# Patient Record
Sex: Female | Born: 1945
Health system: Southern US, Community
[De-identification: ages and names within clinical notes are randomized; demographics above are authoritative.]

## PROBLEM LIST (undated history)

## (undated) DIAGNOSIS — D473 Essential (hemorrhagic) thrombocythemia: Secondary | ICD-10-CM

## (undated) DIAGNOSIS — I639 Cerebral infarction, unspecified: Secondary | ICD-10-CM

## (undated) DIAGNOSIS — M1990 Unspecified osteoarthritis, unspecified site: Secondary | ICD-10-CM

## (undated) DIAGNOSIS — M810 Age-related osteoporosis without current pathological fracture: Secondary | ICD-10-CM

## (undated) DIAGNOSIS — D75839 Thrombocytosis, unspecified: Secondary | ICD-10-CM

## (undated) DIAGNOSIS — B1920 Unspecified viral hepatitis C without hepatic coma: Secondary | ICD-10-CM

## (undated) DIAGNOSIS — J449 Chronic obstructive pulmonary disease, unspecified: Secondary | ICD-10-CM

## (undated) DIAGNOSIS — I10 Essential (primary) hypertension: Secondary | ICD-10-CM

## (undated) DIAGNOSIS — E785 Hyperlipidemia, unspecified: Secondary | ICD-10-CM

## (undated) HISTORY — DX: Hyperlipidemia, unspecified: E78.5

## (undated) HISTORY — PX: CHOLECYSTECTOMY: SHX55

## (undated) HISTORY — DX: Age-related osteoporosis without current pathological fracture: M81.0

## (undated) HISTORY — DX: Unspecified viral hepatitis C without hepatic coma: B19.20

## (undated) HISTORY — DX: Cerebral infarction, unspecified: I63.9

## (undated) HISTORY — DX: Essential (primary) hypertension: I10

## (undated) HISTORY — PX: TONSILLECTOMY: SUR1361

---
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
## 2000-09-18 ENCOUNTER — Other Ambulatory Visit: Admission: RE | Admit: 2000-09-18 | Discharge: 2000-09-18 | Payer: Self-pay | Admitting: Family Medicine

## 2000-09-24 ENCOUNTER — Encounter: Admission: RE | Admit: 2000-09-24 | Discharge: 2000-09-24 | Payer: Self-pay | Admitting: Family Medicine

## 2000-09-24 ENCOUNTER — Encounter: Payer: Self-pay | Admitting: Family Medicine

## 2000-10-20 ENCOUNTER — Encounter: Admission: RE | Admit: 2000-10-20 | Discharge: 2000-10-20 | Payer: Self-pay | Admitting: Family Medicine

## 2000-10-20 ENCOUNTER — Encounter: Payer: Self-pay | Admitting: Family Medicine

## 2002-05-27 ENCOUNTER — Ambulatory Visit (HOSPITAL_BASED_OUTPATIENT_CLINIC_OR_DEPARTMENT_OTHER): Admission: RE | Admit: 2002-05-27 | Discharge: 2002-05-27 | Payer: Self-pay | Admitting: *Deleted

## 2004-04-16 ENCOUNTER — Encounter: Admission: RE | Admit: 2004-04-16 | Discharge: 2004-04-16 | Payer: Self-pay | Admitting: Family Medicine

## 2005-09-24 ENCOUNTER — Encounter: Admission: RE | Admit: 2005-09-24 | Discharge: 2005-09-24 | Payer: Self-pay | Admitting: Family Medicine

## 2005-10-16 ENCOUNTER — Encounter: Admission: RE | Admit: 2005-10-16 | Discharge: 2005-10-16 | Payer: Self-pay | Admitting: Family Medicine

## 2006-03-03 ENCOUNTER — Other Ambulatory Visit: Admission: RE | Admit: 2006-03-03 | Discharge: 2006-03-03 | Payer: Self-pay | Admitting: Obstetrics and Gynecology

## 2006-07-03 ENCOUNTER — Ambulatory Visit (HOSPITAL_COMMUNITY): Admission: RE | Admit: 2006-07-03 | Discharge: 2006-07-03 | Payer: Self-pay | Admitting: Obstetrics and Gynecology

## 2007-06-02 ENCOUNTER — Encounter: Admission: RE | Admit: 2007-06-02 | Discharge: 2007-06-02 | Payer: Self-pay | Admitting: Obstetrics and Gynecology

## 2007-09-23 ENCOUNTER — Encounter: Admission: RE | Admit: 2007-09-23 | Discharge: 2007-09-23 | Payer: Self-pay | Admitting: Family Medicine

## 2008-10-18 ENCOUNTER — Encounter: Admission: RE | Admit: 2008-10-18 | Discharge: 2008-10-18 | Payer: Self-pay | Admitting: Family Medicine

## 2008-10-20 ENCOUNTER — Encounter: Admission: RE | Admit: 2008-10-20 | Discharge: 2008-10-20 | Payer: Self-pay | Admitting: Family Medicine

## 2010-12-02 ENCOUNTER — Encounter: Payer: Self-pay | Admitting: Family Medicine

## 2011-02-25 ENCOUNTER — Other Ambulatory Visit: Payer: Self-pay | Admitting: Chiropractor

## 2011-02-25 DIAGNOSIS — M545 Low back pain, unspecified: Secondary | ICD-10-CM

## 2011-02-27 ENCOUNTER — Ambulatory Visit
Admission: RE | Admit: 2011-02-27 | Discharge: 2011-02-27 | Disposition: A | Discharge status: Home / Self Care | Payer: BC Managed Care – PPO | Source: Ambulatory Visit | Attending: Chiropractor | Admitting: Chiropractor

## 2011-02-27 DIAGNOSIS — M545 Low back pain, unspecified: Secondary | ICD-10-CM

## 2011-03-13 ENCOUNTER — Other Ambulatory Visit: Payer: Self-pay | Admitting: Family Medicine

## 2011-03-13 DIAGNOSIS — IMO0002 Reserved for concepts with insufficient information to code with codable children: Secondary | ICD-10-CM

## 2011-03-15 ENCOUNTER — Other Ambulatory Visit: Payer: Self-pay | Admitting: Chiropractor

## 2011-03-15 DIAGNOSIS — IMO0002 Reserved for concepts with insufficient information to code with codable children: Secondary | ICD-10-CM

## 2011-03-18 ENCOUNTER — Ambulatory Visit
Admission: RE | Admit: 2011-03-18 | Discharge: 2011-03-18 | Disposition: A | Discharge status: Home / Self Care | Payer: BC Managed Care – PPO | Source: Ambulatory Visit | Attending: Family Medicine | Admitting: Family Medicine

## 2011-03-18 DIAGNOSIS — IMO0002 Reserved for concepts with insufficient information to code with codable children: Secondary | ICD-10-CM

## 2011-03-18 MED ORDER — GADOBENATE DIMEGLUMINE 529 MG/ML IV SOLN
15.0000 mL | Freq: Once | INTRAVENOUS | Status: AC | PRN
  Administered 2011-03-18: 15 mL via INTRAVENOUS

## 2011-03-29 NOTE — H&P (Signed)
NAMEPRAGYA, [name]              ACCOUNT NO.:  [id number]   MEDICAL RECORD NO.:  [id number]          PATIENT TYPE:  AMB   LOCATION:  SDC                           FACILITY:  [facility]   PHYSICIAN:  Juluis Mire, M.D.   DATE OF BIRTH:  Jun 01, 1946   DATE OF ADMISSION:  07/03/2006  DATE OF DISCHARGE:                                HISTORY & PHYSICAL   HISTORY OF PRESENT ILLNESS:  The patient is a 65 year old postmenopausal  patient.  She is troubled with stress urinary incontinence and presents with  a mid-urethral sling.   In relation to the present admission, the patient has had problems with  worsening incontinence.  She leaks urine when she coughs, sneezes or  strains.  She underwent urodynamic testing in the office.  She had a minimal  postvoid residual.  Cystometric studies were normal.  She did leak urine  with coughing and Valsalva.  She had normal leak point pressures.  She also  had a normal urethral pressure profile.  Her voiding pattern does not reveal  an obstructive voiding issue.  In view of this, we discussed a mid-urethral  sling.  Success rates of 80% to 85% were quoted.  The patient is in favor of  that and is admitted at the present time.   ALLERGIES:  Allergic to ERYTHROMYCIN.   MEDICATIONS:  1. Levoxyl 150 mg a day.  2. Lovastatin 20 mg a day.  3. Allegra as needed.   PAST MEDICAL HISTORY:  She has had the usual childhood diseases without  significant sequelae.  She is hypothyroid and on medications as noted.   PAST SURGICAL HISTORY:  She had her tonsils removed in 1964, her gallbladder  removed in 1998 and a previous bilateral tubal ligation in 1983.   OBSTETRICAL HISTORY:  Two vaginal deliveries.   FAMILY HISTORY:  Mother with a history of diabetes, strong family history of  hypertension and heart disease.   SOCIAL HISTORY:  There is no tobacco and only occasional alcohol use.   REVIEW OF SYSTEMS:  Noncontributory.   PHYSICAL EXAMINATION:  GENERAL:   The patient is afebrile with stable vital  signs.  HEENT:  The patient is normocephalic.  Pupils are equal, round and reactive  to light and accommodation.  Extraocular movements were intact.  Sclerae and  conjunctivae are clear.  Oropharynx clear.  NECK:  Without thyromegaly.  BREASTS:  No discrete masses.  LUNGS:  Clear.  CARDIOVASCULAR:  Regular rate and rhythm without murmurs or gallops.  ABDOMEN:  Exam is benign, no mass, organomegaly or tenderness.  PELVIC:  Normal external genitalia.  Vaginal mucosa is clear.  She does have  a mild cystourethrocele, some atrophic vaginal changes.  Cervix is  unremarkable.  Uterus is normal size, shape and contour.  Adnexa free of  masses or tenderness.  EXTREMITIES:  Trace edema.  NEUROLOGIC:  Exam is grossly within normal limits.   IMPRESSION:  Stress urinary incontinence secondary to urethral  hypermobility.   PLAN:  The patient will undergo mid-urethral sling.  Risks have been  discussed including the risks of anesthesia, the  risk of infection, the risk  of vascular injury that could lead to hemorrhage requiring transfusion with  the risks of AIDS or hepatitis, the risk of injury to adjacent organs that  could require exploratory surgery, the risks of deep venous thrombosis and  pulmonary embolus.  Potential risk of mesh erosion has been discussed; this  could be vagina, urethra or bladder, requiring removal.  We discussed the  risk of obturator nerve injury that could lead to chronic leg pain, weakness  and numbness; this may be a long-term issue.  We have discussed the risk of  overcorrection resulting in inability to void; this could require removing  the sling with return of incontinence.  We have also discussed the potential  risk of having the development of an overactive bladder; this can be treated  with medication.  The patient expressed understanding of indications and  risks.      Juluis Mire, M.D.  Electronically  Signed     JSM/MEDQ  D:  07/03/2006  T:  07/03/2006  Job:  [id number]

## 2011-03-29 NOTE — Op Note (Signed)
[name], [name]              ACCOUNT NO.:  [id number]   MEDICAL RECORD NO.:  [id number]          PATIENT TYPE:  AMB   LOCATION:  SDC                           FACILITY:  [facility]   PHYSICIAN:  Juluis Mire, M.D.   DATE OF BIRTH:  [birth year]   DATE OF PROCEDURE:  07/03/2006  DATE OF DISCHARGE:                                 OPERATIVE REPORT   PREOPERATIVE DIAGNOSIS:  Genuine stress incontinence due to urethral  hypermobility.   POSTOPERATIVE DIAGNOSIS:  Genuine stress incontinence due to urethral  hypermobility.   PROCEDURE:  Mid-urethral sling using obturator approach, cystoscopy.   SURGEON:  Juluis Mire, M.D.   ANESTHESIA:  General.   ESTIMATED BLOOD LOSS:  100 cc.   PACKS AND DRAINS:  None.   INTRAOPERATIVE BLOOD REPLACED:  None.   COMPLICATIONS:  None.   INDICATIONS FOR PROCEDURE:  As dictated in the history and physical.   DESCRIPTION OF PROCEDURE:  The patient was taken to the OR and placed in the  supine position.  After a satisfactory level of general anesthesia was  obtained, the patient was placed in the dorsal lithotomy position using the  Allen stirrups.  The perineum and vagina were prepped out with Betadine and  draped as a sterile field.  A Foley was placed to straight drain.   The mid-urethral area was identified.  The vaginal mucosa was instilled with  a solution of 1% Xylocaine with epinephrine.  Using the knife, an incision  was made in the vaginal mucosa underlying the urethra.  We then sharply  dissected outerly towards the obturator foramen.  We developed a nice tunnel  out to the obturator foramen on each side.  We then identified a place  beside the vagina at the level of the clitoris below the abductus muscle and  on the medial edge of the obturator foramen.  These areas were marked and  instilled with 1% Xylocaine with epinephrine.  An incision was made.  Using  the helical [employer] transobturator needle, this was passed on  each  side into the vaginal opening.  The Foley was then removed.  Cystoscopy was  performed.  There was no evidence of any injury to the bladder or urethra.  Both ureteral orifices were noted and did puff urine.  At this point in  time, the polypropylene sling was hooked to each needle and brought out  through the vagina, out through the openings of the skin.  We then adjusted  the sling appropriately and removed the plastic sleeves.  The Foley was  placed back to straight drain.  The vaginal mucosa was then closed with a  running suture of 2-0 Vicryl.  We had no active bleeding or signs of  complication.  Urine output was clear and adequate.  Sponge, needle, and  instrument counts were reported as correct by the circulating nurse x2.   The patient was taken out of the dorsal lithotomy position and once alert  and extubated was transferred to the recovery room in good condition.      Juluis Mire, M.D.  Electronically Signed  JSM/MEDQ  D:  07/03/2006  T:  07/03/2006  Job:  [id number]

## 2011-03-29 NOTE — Op Note (Signed)
[place]  Patient:    [name], [name] Visit Number: [id number] MRN: [id number]          Service Type: DSU Location: [facility] Attending Physician:  Kendell Bane Dictated by:   Lowell Bouton, M.D. Proc. Date: 05/27/02 Admit Date:  05/27/2002   CC:         Carolyne Fiscal, M.D.   Operative Report  PREOPERATIVE DIAGNOSIS:  Septic arthritis, distal interphalangeal joint, left middle finger.  POSTOPERATIVE DIAGNOSIS:  Septic arthritis, distal interphalangeal joint, left middle finger.  OPERATION PERFORMED:  Incision and drainage of distal interphalangeal joint, left middle finger.  SURGEON:  Lowell Bouton, M.D.  ANESTHESIA:  General.  OPERATIVE FINDINGS:  The patient had gross purulent material in the DIP joint and along the extensor tendon sheath.  The flexor sheath did not appear to be involved.  DESCRIPTION OF PROCEDURE:  Under general anesthesia with the tourniquet on the left arm, the left hand was prepped and draped in the usual sterile fashion and after exsanguinating the limb, the tourniquet was inflated to 250 mHg.  A 0.5% Marcaine digital block was inserted for postoperative pain control.  A longitudinal incision was made over the dorsum of the DIP joint down to the extensor tendon.  Blunt dissection was carried down to the tendon and the tendon was longitudinally incised.  Gross purulent material was obtained from the joint and cultures were sent to the lab for aerobic and anaerobic bacteria.  The extensor tendon was undermined and there was a fair amount of fluid that was debrided.  The joint was irrigated copiously with antibiotic solution.  A V-shaped incision was then made across the DIP crease volarly and blunt dissection carried down to the flexor sheath.  The tendon appeared normal at the A-5 pulley and there was no gross purulence.  This wound was irrigated with antibiotic solution.   Both wounds were packed with iodoform packing and the edges were closed with 4-0 nylon sutures.  Sterile dressings were applied.  The tourniquet was released with good circulation to the digits.  The patient went to the recovery room awake and stable in good condition. Dictated by:   Lowell Bouton, M.D. Attending Physician:  Kendell Bane DD:  05/27/02 TD:  05/28/02 Job: [id number]

## 2013-02-18 ENCOUNTER — Other Ambulatory Visit: Payer: Self-pay | Admitting: Gastroenterology

## 2014-05-04 ENCOUNTER — Encounter: Payer: Self-pay | Admitting: *Deleted

## 2014-05-04 DIAGNOSIS — E785 Hyperlipidemia, unspecified: Secondary | ICD-10-CM | POA: Insufficient documentation

## 2014-05-04 DIAGNOSIS — I10 Essential (primary) hypertension: Secondary | ICD-10-CM

## 2014-08-15 ENCOUNTER — Emergency Department (HOSPITAL_COMMUNITY): Payer: Medicare Other

## 2014-08-15 ENCOUNTER — Inpatient Hospital Stay (HOSPITAL_COMMUNITY)
Admission: EM | Admit: 2014-08-15 | Discharge: 2014-08-17 | Disposition: A | Discharge status: Home / Self Care | Payer: Medicare Other | Attending: Internal Medicine | Admitting: Internal Medicine

## 2014-08-15 ENCOUNTER — Encounter (HOSPITAL_COMMUNITY): Payer: Self-pay | Admitting: Emergency Medicine

## 2014-08-15 DIAGNOSIS — I160 Hypertensive urgency: Secondary | ICD-10-CM

## 2014-08-15 DIAGNOSIS — J323 Chronic sphenoidal sinusitis: Secondary | ICD-10-CM | POA: Insufficient documentation

## 2014-08-15 DIAGNOSIS — B1920 Unspecified viral hepatitis C without hepatic coma: Secondary | ICD-10-CM | POA: Insufficient documentation

## 2014-08-15 DIAGNOSIS — Z7983 Long term (current) use of bisphosphonates: Secondary | ICD-10-CM | POA: Insufficient documentation

## 2014-08-15 DIAGNOSIS — Z87891 Personal history of nicotine dependence: Secondary | ICD-10-CM | POA: Insufficient documentation

## 2014-08-15 DIAGNOSIS — E785 Hyperlipidemia, unspecified: Secondary | ICD-10-CM | POA: Diagnosis not present

## 2014-08-15 DIAGNOSIS — E039 Hypothyroidism, unspecified: Secondary | ICD-10-CM | POA: Diagnosis not present

## 2014-08-15 DIAGNOSIS — G453 Amaurosis fugax: Secondary | ICD-10-CM

## 2014-08-15 DIAGNOSIS — G44209 Tension-type headache, unspecified, not intractable: Secondary | ICD-10-CM

## 2014-08-15 DIAGNOSIS — R51 Headache: Principal | ICD-10-CM | POA: Insufficient documentation

## 2014-08-15 DIAGNOSIS — E038 Other specified hypothyroidism: Secondary | ICD-10-CM

## 2014-08-15 DIAGNOSIS — I10 Essential (primary) hypertension: Secondary | ICD-10-CM | POA: Diagnosis present

## 2014-08-15 DIAGNOSIS — R519 Headache, unspecified: Secondary | ICD-10-CM

## 2014-08-15 DIAGNOSIS — Z79899 Other long term (current) drug therapy: Secondary | ICD-10-CM | POA: Insufficient documentation

## 2014-08-15 DIAGNOSIS — B182 Chronic viral hepatitis C: Secondary | ICD-10-CM

## 2014-08-15 DIAGNOSIS — G459 Transient cerebral ischemic attack, unspecified: Secondary | ICD-10-CM

## 2014-08-15 DIAGNOSIS — J019 Acute sinusitis, unspecified: Secondary | ICD-10-CM | POA: Diagnosis present

## 2014-08-15 DIAGNOSIS — G458 Other transient cerebral ischemic attacks and related syndromes: Secondary | ICD-10-CM

## 2014-08-15 DIAGNOSIS — J0100 Acute maxillary sinusitis, unspecified: Secondary | ICD-10-CM | POA: Insufficient documentation

## 2014-08-15 LAB — URINALYSIS, ROUTINE W REFLEX MICROSCOPIC
Bilirubin Urine: NEGATIVE
Glucose, UA: NEGATIVE mg/dL
HGB URINE DIPSTICK: NEGATIVE
KETONES UR: 15 mg/dL — AB
Leukocytes, UA: NEGATIVE
NITRITE: NEGATIVE
PH: 6 (ref 5.0–8.0)
Protein, ur: NEGATIVE mg/dL
Specific Gravity, Urine: 1.013 (ref 1.005–1.030)
Urobilinogen, UA: 0.2 mg/dL (ref 0.0–1.0)

## 2014-08-15 LAB — COMPREHENSIVE METABOLIC PANEL
ALBUMIN: 4.1 g/dL (ref 3.5–5.2)
ALT: 21 U/L (ref 0–35)
AST: 18 U/L (ref 0–37)
Alkaline Phosphatase: 70 U/L (ref 39–117)
Anion gap: 11 (ref 5–15)
BUN: 13 mg/dL (ref 6–23)
CO2: 26 mEq/L (ref 19–32)
CREATININE: 0.95 mg/dL (ref 0.50–1.10)
Calcium: 9.5 mg/dL (ref 8.4–10.5)
Chloride: 102 mEq/L (ref 96–112)
GFR calc Af Amer: 70 mL/min — ABNORMAL LOW (ref >90)
GFR, EST NON AFRICAN AMERICAN: 61 mL/min — AB (ref >90)
Glucose, Bld: 93 mg/dL (ref 70–99)
Potassium: 3.7 mEq/L (ref 3.7–5.3)
SODIUM: 139 meq/L (ref 137–147)
TOTAL PROTEIN: 6.9 g/dL (ref 6.0–8.3)
Total Bilirubin: 0.9 mg/dL (ref 0.3–1.2)

## 2014-08-15 LAB — CBC WITH DIFFERENTIAL/PLATELET
BASOS ABS: 0.1 10*3/uL (ref 0.0–0.1)
Basophils Relative: 1 % (ref 0–1)
EOS PCT: 3 % (ref 0–5)
Eosinophils Absolute: 0.3 10*3/uL (ref 0.0–0.7)
HEMATOCRIT: 43.1 % (ref 36.0–46.0)
Hemoglobin: 14.5 g/dL (ref 12.0–15.0)
Lymphocytes Relative: 22 % (ref 12–46)
Lymphs Abs: 2.1 10*3/uL (ref 0.7–4.0)
MCH: 30.1 pg (ref 26.0–34.0)
MCHC: 33.6 g/dL (ref 30.0–36.0)
MCV: 89.4 fL (ref 78.0–100.0)
MONOS PCT: 8 % (ref 3–12)
Monocytes Absolute: 0.8 10*3/uL (ref 0.1–1.0)
NEUTROS ABS: 6.3 10*3/uL (ref 1.7–7.7)
NEUTROS PCT: 66 % (ref 43–77)
PLATELETS: 338 10*3/uL (ref 150–400)
RBC: 4.82 MIL/uL (ref 3.87–5.11)
RDW: 13.3 % (ref 11.5–15.5)
WBC: 9.5 10*3/uL (ref 4.0–10.5)

## 2014-08-15 LAB — I-STAT TROPONIN, ED: TROPONIN I, POC: 0.01 ng/mL (ref 0.00–0.08)

## 2014-08-15 MED ORDER — HYDROCORTISONE 0.5 % EX CREA
TOPICAL_CREAM | Freq: Every day | CUTANEOUS | Status: DC | PRN
  Filled 2014-08-15: qty 28.35

## 2014-08-15 MED ORDER — RISAQUAD PO CAPS
1.0000 | ORAL_CAPSULE | Freq: Every day | ORAL | Status: DC
  Administered 2014-08-16 – 2014-08-17 (×2): 1 via ORAL
  Filled 2014-08-15 (×2): qty 1

## 2014-08-15 MED ORDER — ALIGN PO CAPS: 1.0000 | ORAL_CAPSULE | Freq: Every day | ORAL | Status: DC

## 2014-08-15 MED ORDER — VITAMIN D3 25 MCG (1000 UNIT) PO TABS
2000.0000 [IU] | ORAL_TABLET | Freq: Every evening | ORAL | Status: DC
  Administered 2014-08-16: 2000 [IU] via ORAL
  Filled 2014-08-15 (×5): qty 2

## 2014-08-15 MED ORDER — HYDROCODONE-ACETAMINOPHEN 5-325 MG PO TABS
1.0000 | ORAL_TABLET | Freq: Once | ORAL | Status: AC
  Administered 2014-08-15: 1 via ORAL
  Filled 2014-08-15: qty 1

## 2014-08-15 MED ORDER — LEVOTHYROXINE SODIUM 100 MCG PO TABS
125.0000 ug | ORAL_TABLET | Freq: Every day | ORAL | Status: DC
  Administered 2014-08-16 – 2014-08-17 (×2): 125 ug via ORAL
  Filled 2014-08-15 (×4): qty 1

## 2014-08-15 MED ORDER — ASPIRIN 325 MG PO TABS
325.0000 mg | ORAL_TABLET | Freq: Every day | ORAL | Status: DC
  Administered 2014-08-16: 325 mg via ORAL
  Filled 2014-08-15: qty 1

## 2014-08-15 MED ORDER — DARIFENACIN HYDROBROMIDE ER 7.5 MG PO TB24
7.5000 mg | ORAL_TABLET | Freq: Every day | ORAL | Status: DC
  Administered 2014-08-16 – 2014-08-17 (×2): 7.5 mg via ORAL
  Filled 2014-08-15 (×2): qty 1

## 2014-08-15 MED ORDER — SENNOSIDES-DOCUSATE SODIUM 8.6-50 MG PO TABS: 1.0000 | ORAL_TABLET | Freq: Every evening | ORAL | Status: DC | PRN

## 2014-08-15 MED ORDER — ONDANSETRON HCL 4 MG/2ML IJ SOLN
4.0000 mg | Freq: Once | INTRAMUSCULAR | Status: AC
  Administered 2014-08-15: 4 mg via INTRAVENOUS
  Filled 2014-08-15: qty 2

## 2014-08-15 MED ORDER — CLOBETASOL PROPIONATE 0.05 % EX CREA
1.0000 "application " | TOPICAL_CREAM | Freq: Every day | CUTANEOUS | Status: DC | PRN
  Filled 2014-08-15: qty 15

## 2014-08-15 MED ORDER — HEPARIN SODIUM (PORCINE) 5000 UNIT/ML IJ SOLN
5000.0000 [IU] | Freq: Three times a day (TID) | INTRAMUSCULAR | Status: DC
  Administered 2014-08-15 – 2014-08-17 (×4): 5000 [IU] via SUBCUTANEOUS
  Filled 2014-08-15 (×4): qty 1

## 2014-08-15 MED ORDER — ATORVASTATIN CALCIUM 10 MG PO TABS
10.0000 mg | ORAL_TABLET | Freq: Every evening | ORAL | Status: DC
  Administered 2014-08-16: 10 mg via ORAL
  Filled 2014-08-15 (×2): qty 1

## 2014-08-15 MED ORDER — MORPHINE SULFATE 4 MG/ML IJ SOLN: 4.0000 mg | Freq: Once | INTRAMUSCULAR | Status: DC

## 2014-08-15 MED ORDER — STROKE: EARLY STAGES OF RECOVERY BOOK
Freq: Once | Status: AC
Start: 2014-08-15 — End: 2014-08-15
  Administered 2014-08-15: 1
  Filled 2014-08-15: qty 1

## 2014-08-15 MED ORDER — ALPRAZOLAM 0.25 MG PO TABS: 0.2500 mg | ORAL_TABLET | Freq: Every day | ORAL | Status: DC | PRN

## 2014-08-15 NOTE — H&P (Signed)
[facility] History and Physical  TALYA QUAIN OJJ:[id number] DOB: 1946/02/11 DOA: 08/15/2014  Referring physician: ED physician PCP: Cari Caraway, MD  Specialists:   Chief Complaint: Headache  HPI: [name] is a 68 y.o. female with PMH of hypertension, hyperlipidemia and who presents complaining of headache.  The patient reports that she started having headache after exercise in Gym at noon. Her headache is located in the left orbital area. It is constant, sharp and throbbing, nonradiating. She took Advil without help. Patient also reports that she seemed to be confused when she received text message from others. She could not understand clearly about the meaning of text message and failed to response to the message transiently. The episode lasted for about 20 minutes.   She did not have any hearing loss, weakness, numbness or tingling sensations over her extremities. She denies slurring of speech, but I found that patient has difficulty in finding words to respond to my questions. Her husband agreed with me that patient has difficulty in speech. She does not have fever, chills, cough, chest pain, shortness of breath. No history of migraine headache. Of note, a month ago patient had the blood pressure medication change. She was initially taking hydrochlorothiazide and losartan. Her doctor discontinue hydrochlorothiazide and double her losartan dose. Currently on losartan 100 mg daily. Her blood pressure was elevated at 190/88 in ED.   She had CT-head which showed R sinusitis without other acute intracranial abnormalities. She is admitted to the inpatient for further evaluation and treatment.  Review of Systems: As presented in the history of presenting illness, rest negative.  Where does patient live?  Lives with her husband at home Can patient participate in ADLs? Yes  Allergy:  Allergies  Allergen Reactions  . Erythromycin Itching and Other (See Comments)    Severe  stomach pains, diarrhea    Past Medical History  Diagnosis Date  . Hypertension   . Hyperlipidemia   . Osteoporosis   . Hepatitis C     History reviewed. No pertinent past surgical history.  Social History:  reports that she has quit smoking. She does not have any smokeless tobacco history on file. She reports that she does not drink alcohol or use illicit drugs.  Family History: History reviewed. No pertinent family history.   Prior to Admission medications   Medication Sig Start Date End Date Taking? Authorizing Provider  ALPRAZolam Duanne Moron) 0.5 MG tablet Take 0.25 mg by mouth daily as needed for anxiety.    Yes Historical Provider, MD  atorvastatin (LIPITOR) 10 MG tablet Take 10 mg by mouth every evening.    Yes Historical Provider, MD  Cholecalciferol (VITAMIN D3) 2000 UNITS TABS Take 2,000 Units by mouth every evening.   Yes Historical Provider, MD  clobetasol cream (TEMOVATE) 9.37 % Apply 1 application topically daily as needed (itching).  08/11/14  Yes Historical Provider, MD  HYDROCORTISONE EX Apply 1 application topically 3 (three) times daily as needed (itching).   Yes Historical Provider, MD  ibandronate (BONIVA) 150 MG tablet Take 150 mg by mouth every 30 (thirty) days. Take in the morning with a full glass of water, on an empty stomach, and do not take anything else by mouth or lie down for the next 30 min.   Yes Historical Provider, MD  ibuprofen (ADVIL,MOTRIN) 200 MG tablet Take 600 mg by mouth 2 (two) times daily as needed for headache (pain).   Yes Historical Provider, MD  levothyroxine (SYNTHROID, LEVOTHROID) 125 MCG tablet Take 125  mcg by mouth daily before breakfast.   Yes Historical Provider, MD  losartan (COZAAR) 100 MG tablet Take 100 mg by mouth every evening.   Yes Historical Provider, MD  Probiotic Product (ALIGN PO) Take 1 capsule by mouth daily.   Yes Historical Provider, MD  solifenacin (VESICARE) 10 MG tablet Take 10 mg by mouth every evening.    Yes  Historical Provider, MD    Physical Exam: Filed Vitals:   08/15/14 2248 08/16/14 0040 08/16/14 0237 08/16/14 0434  BP: 144/77 95/77 104/60 112/60  Pulse: 70 74 67 64  Temp: 98.2 F (36.8 C) 97.9 F (36.6 C) 98.1 F (36.7 C) 98.2 F (36.8 C)  TempSrc: Oral Oral Oral Oral  Resp: 18 18 16 16   Height: 5\' 2"  (1.575 m)     Weight: 67.6 kg (149 lb 0.5 oz)     SpO2: 99% 99% 95% 96%   General: Not in acute distress HEENT: there is no tenderness over her frontal and the maxillary sinuses.        Eyes: PERRL, EOMI, no scleral icterus       ENT: No discharge from the ears and nose, no pharynx injection, no tonsillar enlargement.        Neck: No JVD, no bruit, no mass felt. Cardiac: S1/S2, RRR, No murmurs, gallops or rubs Pulm: Good air movement bilaterally. Clear to auscultation bilaterally. No rales, wheezing, rhonchi or rubs. Abd: Soft, nondistended, nontender, no rebound pain, no organomegaly, BS present Ext: No edema. 2+DP/PT pulse bilaterally Musculoskeletal: No joint deformities, erythema, or stiffness, ROM full Skin: No rashes.  Neuro: Alert and oriented X3, cranial nerves II-XII grossly intact, muscle strength 5/5 in all extremeties, sensation to light touch intact. Brachial reflex 2+ bilaterally. Knee reflex 1+ bilaterally. Negative Babinski's sign. Normal finger to nose test. Psych: Patient is not psychotic, no suicidal or hemocidal ideation.  Labs on Admission:  Basic Metabolic Panel:  Recent Labs Lab 08/15/14 1838  NA 139  K 3.7  CL 102  CO2 26  GLUCOSE 93  BUN 13  CREATININE 0.95  CALCIUM 9.5   Liver Function Tests:  Recent Labs Lab 08/15/14 1838  AST 18  ALT 21  ALKPHOS 70  BILITOT 0.9  PROT 6.9  ALBUMIN 4.1   No results found for this basename: LIPASE, AMYLASE,  in the last 168 hours No results found for this basename: AMMONIA,  in the last 168 hours CBC:  Recent Labs Lab 08/15/14 1838  WBC 9.5  NEUTROABS 6.3  HGB 14.5  HCT 43.1  MCV 89.4   PLT 338   Cardiac Enzymes: No results found for this basename: CKTOTAL, CKMB, CKMBINDEX, TROPONINI,  in the last 168 hours  BNP (last 3 results) No results found for this basename: PROBNP,  in the last 8760 hours CBG: No results found for this basename: GLUCAP,  in the last 168 hours  Radiological Exams on Admission: Dg Chest 2 View  08/15/2014   CLINICAL DATA:  Anterior left headache and altered mental status for 1 day. Ex-smoker.  EXAM: CHEST  2 VIEW  COMPARISON:  None.  FINDINGS: Normal sized heart. Flattening of the hemidiaphragms on the lateral view. Mild diffuse peribronchial thickening and accentuation of the interstitial markings. Diffuse osteopenia. Mild thoracic spine degenerative changes. Cholecystectomy clips.  IMPRESSION: 1. No acute abnormality. 2. Mild changes of COPD and chronic bronchitis.   Electronically Signed   By: Enrique Sack M.D.   On: 08/15/2014 19:40   Ct Head Wo Contrast  08/15/2014   CLINICAL DATA:  Headache that started at 12:30 p.m. today. She also had a brief period of confusion.  EXAM: CT HEAD WITHOUT CONTRAST  TECHNIQUE: Contiguous axial images were obtained from the base of the skull through the vertex without intravenous contrast.  COMPARISON:  Orbits CT dated 10/20/2008.  FINDINGS: Patchy white matter low density in both cerebral hemispheres. Normal size and position of the ventricles. No intracranial hemorrhage, mass lesion or CT evidence of acute infarction. Enlarged central sulcus on the left as well as focal enlargement of a more posteriorly located sulcus on the left. Right maxillary sinus air-fluid level and mild mucosal thickening. Left sphenoid sinus mucosal thickening. Unremarkable bones.  IMPRESSION: 1. No acute abnormality. 2. Marked chronic small vessel white matter ischemic changes in both cerebral hemispheres. 3. Acute right maxillary sinusitis and chronic left sphenoid sinusitis. 4. Focal sulcal enlargement on the left, as described above. This could  be due to focal atrophy or arachnoid cyst formation.   Electronically Signed   By: Enrique Sack M.D.   On: 08/15/2014 19:36    EKG: Independently reviewed.   Assessment/Plan Principal Problem:   Headache Active Problems:   Hyperlipidemia   Hypertension   Hypothyroidism   Acute sinusitis  1. Headache: Etiology is not clear, differential diagnoses include TIA/stroke given her difficulty in speech, and sinusitis as evidenced by CT-head. Patient neuro examination is completely normal. Given her significant risk factors, including lipidemia, hypertension, hypothyroidism and strong family medical history of stroke (both parents had stroke), it is important to rule out TIA or stroke.  - will admit to tele bed - MRI/MRA - Check carotid dopplers - Start ASA 325 mg daily - continue Lipitor - check A1c - will not get fasting lipid panel and TSH since she was tested by her PCP recently with satisfactory results  - 12 lead EKG - 2D transthoracic echocardiography - PT/OT consult  2. HTN:  - hold losartan in the setting of possible TIA or stroke  3. Acute sinusitis: CT head showed acute right maxillary sinusitis and chronic left sphenoid sinusitis, but patient is asymptomatic. It is likely viral, rather than bacterial sinusitis -will observe now.   4. HLD: continue home lipitor  5. hypothyroidism: Continue home Synthroid 125 mcg daily.   DVT ppx: SQ Heparin    Code Status: Full code Family Communication: Yes, patient's  husband at bed side Disposition Plan: Admit to inpatient  Ivor Costa [facility] Pager [phone]  If 7PM-7AM, please contact night-coverage [email or web address] Password [email or web address] 08/16/2014, 5:04 AM

## 2014-08-15 NOTE — ED Notes (Signed)
Pt has h/a over left eye-- does not normally have headaches/

## 2014-08-15 NOTE — ED Notes (Signed)
Admitting Physician at the bedside.  

## 2014-08-15 NOTE — ED Provider Notes (Signed)
CSN: [id number]     Arrival date & time 08/15/14  1647 History   First MD Initiated Contact with Patient 08/15/14 1806     Chief Complaint  Patient presents with  . Headache     (Consider location/radiation/quality/duration/timing/severity/associated sxs/prior Treatment) HPI  68 year old female with history of hypertension, hepatitis C, hyperlipidemia and who presents complaining of headache. A month ago patient had the blood pressure medication change. She was initially taking hydrochlorothiazide and losartan. Her doctor discontinue hydrochlorothiazide and double her losartan dose.  Patient reports since then she has had recurrent headache. She described headaches as a sharp and throbbing sensation behind her left eye usually lasting for minutes and improves with taking Advil. Earlier today she was playing racquetball with her husband. She tripped and fell out but did not strike her head and was able to finish the rest of the game. Later today she developed similar headache behind L eye except more intense. She took the Advil/ibuprofen, a total of 600 mg without any relief. She went to sleep, woke up shortly after to answer a text message.  However she reports having difficulty seeing the text and had a transient sense of confusion. This finding concerns her prompting her to come to the ER for further evaluation. Headache has improved since being in ER.  Currently c/o 6/10 headache.  Otherwise patient did not report any fever, chills, double vision, chest pain, shortness of breath, productive cough, abdominal pain, nausea vomiting diarrhea, dysuria, numbness, weakness, or rash.  Pt did take her BP medication today.     Past Medical History  Diagnosis Date  . Hypertension   . Hyperlipidemia   . Osteoporosis   . Hepatitis C    History reviewed. No pertinent past surgical history. History reviewed. No pertinent family history. History  Substance Use Topics  . Smoking status: Former Research scientist (life sciences)   . Smokeless tobacco: Not on file  . Alcohol Use: No   OB History   Grav Para Term Preterm Abortions TAB SAB Ect Mult Living                 Review of Systems  All other systems reviewed and are negative.     Allergies  Erythromycin  Home Medications   Prior to Admission medications   Medication Sig Start Date End Date Taking? Authorizing Provider  ALPRAZolam Duanne Moron) 0.5 MG tablet Take 0.25 mg by mouth daily as needed for anxiety.    Yes Historical Provider, MD  atorvastatin (LIPITOR) 10 MG tablet Take 10 mg by mouth every evening.    Yes Historical Provider, MD  Cholecalciferol (VITAMIN D3) 2000 UNITS TABS Take 2,000 Units by mouth every evening.   Yes Historical Provider, MD  clobetasol cream (TEMOVATE) 2.35 % Apply 1 application topically daily as needed (itching).  08/11/14  Yes Historical Provider, MD  HYDROCORTISONE EX Apply 1 application topically 3 (three) times daily as needed (itching).   Yes Historical Provider, MD  ibandronate (BONIVA) 150 MG tablet Take 150 mg by mouth every 30 (thirty) days. Take in the morning with a full glass of water, on an empty stomach, and do not take anything else by mouth or lie down for the next 30 min.   Yes Historical Provider, MD  ibuprofen (ADVIL,MOTRIN) 200 MG tablet Take 600 mg by mouth 2 (two) times daily as needed for headache (pain).   Yes Historical Provider, MD  levothyroxine (SYNTHROID, LEVOTHROID) 125 MCG tablet Take 125 mcg by mouth daily before breakfast.   Yes  Historical Provider, MD  losartan (COZAAR) 100 MG tablet Take 100 mg by mouth every evening.   Yes Historical Provider, MD  Probiotic Product (ALIGN PO) Take 1 capsule by mouth daily.   Yes Historical Provider, MD  solifenacin (VESICARE) 10 MG tablet Take 10 mg by mouth every evening.    Yes Historical Provider, MD   BP 190/88  Pulse 77  Temp(Src) 97.3 F (36.3 C)  Resp 16  Wt 150 lb (68.04 kg)  SpO2 100% Physical Exam  Nursing note and vitals  reviewed. Constitutional: She is oriented to person, place, and time. She appears well-developed and well-nourished. No distress.  HENT:  Head: Atraumatic.  Eyes: Conjunctivae and EOM are normal. Pupils are equal, round, and reactive to light.  Neck: Neck supple.  No nuchal rigidity  Cardiovascular: Normal rate and regular rhythm.   Pulmonary/Chest: Effort normal and breath sounds normal.  Abdominal: Soft. There is no tenderness.  Neurological: She is alert and oriented to person, place, and time.  Neurologic exam:  Speech clear, pupils equal round reactive to light, extraocular movements intact  Normal peripheral visual fields Cranial nerves III through XII normal including no facial droop Follows commands, moves all extremities x4, normal strength to bilateral upper and lower extremities at all major muscle groups including grip Sensation normal to light touch and pinprick Coordination intact, no limb ataxia, finger-nose-finger normal Rapid alternating movements normal No pronator drift Gait normal   Skin: No rash noted.  Psychiatric: She has a normal mood and affect.    ED Course  Procedures (including critical care time) Patient with recurrent headache behind her left eye with transient episode of vision changes and confusion which has resolved. Symptom concerning for TIA. Her ABCD2 score is 2, low risk.  Patient with initial blood pressure of 184/88. Concerning for hypertensive urgency versus emergency.  Work up initiated. Care discussed with Dr. Thurnell Garbe.   9:33 PM Head CT demonstrated evidence of acute right maxillary sinusitis and chronic left sphenoid sinusitis. Patient however denies any symptoms to suggest active sinusitis. Also finding of focal sulcal enlargement on the left which may be be due to focal atrophy or arachnoid cyst formation. Unsure if this is related to patient's headache. The remainder of her examination and labs are unremarkable. Given her age,  hypertension, and headache, I have consult at [facility] for admission for further workup for either TIA versus hypertensive urgency. Patient will be admitted to Dr. Blaine Hamper, med surg, under his care.  Pt agrees with plan.     Labs Review Labs Reviewed  COMPREHENSIVE METABOLIC PANEL - Abnormal; Notable for the following:    GFR calc non Af Amer 61 (*)    GFR calc Af Amer 70 (*)    All other components within normal limits  CBC WITH DIFFERENTIAL  URINALYSIS, ROUTINE W REFLEX MICROSCOPIC  I-STAT TROPOININ, ED    Imaging Review Dg Chest 2 View  08/15/2014   CLINICAL DATA:  Anterior left headache and altered mental status for 1 day. Ex-smoker.  EXAM: CHEST  2 VIEW  COMPARISON:  None.  FINDINGS: Normal sized heart. Flattening of the hemidiaphragms on the lateral view. Mild diffuse peribronchial thickening and accentuation of the interstitial markings. Diffuse osteopenia. Mild thoracic spine degenerative changes. Cholecystectomy clips.  IMPRESSION: 1. No acute abnormality. 2. Mild changes of COPD and chronic bronchitis.   Electronically Signed   By: Enrique Sack M.D.   On: 08/15/2014 19:40   Ct Head Wo Contrast  08/15/2014  CLINICAL DATA:  Headache that started at 12:30 p.m. today. She also had a brief period of confusion.  EXAM: CT HEAD WITHOUT CONTRAST  TECHNIQUE: Contiguous axial images were obtained from the base of the skull through the vertex without intravenous contrast.  COMPARISON:  Orbits CT dated 10/20/2008.  FINDINGS: Patchy white matter low density in both cerebral hemispheres. Normal size and position of the ventricles. No intracranial hemorrhage, mass lesion or CT evidence of acute infarction. Enlarged central sulcus on the left as well as focal enlargement of a more posteriorly located sulcus on the left. Right maxillary sinus air-fluid level and mild mucosal thickening. Left sphenoid sinus mucosal thickening. Unremarkable bones.  IMPRESSION: 1. No acute abnormality. 2. Marked chronic  small vessel white matter ischemic changes in both cerebral hemispheres. 3. Acute right maxillary sinusitis and chronic left sphenoid sinusitis. 4. Focal sulcal enlargement on the left, as described above. This could be due to focal atrophy or arachnoid cyst formation.   Electronically Signed   By: Enrique Sack M.D.   On: 08/15/2014 19:36     EKG Interpretation None      Date: 08/15/2014  Rate: 73  Rhythm: normal sinus rhythm  QRS Axis: normal  Intervals: normal  ST/T Wave abnormalities: nonspecific ST/T changes  Conduction Disutrbances:none  Narrative Interpretation:   Old EKG Reviewed: unchanged    MDM   Final diagnoses:  Amaurosis fugax of left eye  Headache around the eyes  Hypertensive urgency  Transient cerebral ischemia, unspecified transient cerebral ischemia type    BP 160/77  Pulse 72  Temp(Src) 98.2 F (36.8 C)  Resp 17  Wt 150 lb (68.04 kg)  SpO2 97%  I have reviewed nursing notes and vital signs. I personally reviewed the imaging tests through PACS system  I reviewed available ER/hospitalization records thought the EMR     Domenic Moras, PA-C 08/15/14 2137  Domenic Moras, PA-C 08/15/14 2138

## 2014-08-15 NOTE — Progress Notes (Signed)
Patient arrived to 4N02 AAOx4. She walked to the bed. Vital signs were taken, tele placed, and questions answered. Denies any headache at this time. Will continue to monitor. [name], [name]

## 2014-08-15 NOTE — ED Notes (Signed)
Per pt sts HA that started about 12:30 this afternoon. sts she also had a brief period of confusion. sts hypertensive. Pt 184/88 in triage. sts took BP meds 45 min ago.

## 2014-08-15 NOTE — ED Notes (Signed)
Pt monitored by pulse ox, bp cuff, and 5-lead. 

## 2014-08-15 NOTE — ED Notes (Signed)
Pt states she fell "on butt" today at the gym while playing raquetball, did not hit head, husband states no LOC, was a witness. Has had BP medicine changed recently.

## 2014-08-16 ENCOUNTER — Inpatient Hospital Stay (HOSPITAL_COMMUNITY): Payer: Medicare Other

## 2014-08-16 DIAGNOSIS — J019 Acute sinusitis, unspecified: Secondary | ICD-10-CM | POA: Diagnosis present

## 2014-08-16 DIAGNOSIS — G453 Amaurosis fugax: Secondary | ICD-10-CM

## 2014-08-16 DIAGNOSIS — G459 Transient cerebral ischemic attack, unspecified: Secondary | ICD-10-CM

## 2014-08-16 LAB — HEMOGLOBIN A1C
HEMOGLOBIN A1C: 5.9 % — AB (ref <5.7)
MEAN PLASMA GLUCOSE: 123 mg/dL — AB (ref <117)

## 2014-08-16 LAB — TROPONIN I: Troponin I: <0.3 ng/mL (ref <0.30)

## 2014-08-16 LAB — TSH: TSH: 3.28 u[IU]/mL (ref 0.350–4.500)

## 2014-08-16 MED ORDER — ASPIRIN 81 MG PO TABS: 81.0000 mg | ORAL_TABLET | Freq: Every day | ORAL | Status: DC

## 2014-08-16 MED ORDER — AMOXICILLIN-POT CLAVULANATE 875-125 MG PO TABS
1.0000 | ORAL_TABLET | Freq: Two times a day (BID) | ORAL | Status: DC
  Administered 2014-08-16 – 2014-08-17 (×3): 1 via ORAL
  Filled 2014-08-16 (×3): qty 1

## 2014-08-16 MED ORDER — AMOXICILLIN-POT CLAVULANATE 875-125 MG PO TABS: 1.0000 | ORAL_TABLET | Freq: Two times a day (BID) | ORAL | Status: DC

## 2014-08-16 NOTE — Progress Notes (Signed)
SLP Cancellation Note  Patient Details Name: [name] MRN: [id number] DOB: 12-04-1945   Cancelled treatment:       Reason Eval/Treat Not Completed: SLP screened, no needs identified, will sign off   Juan Quam Laurice 08/16/2014, 3:50 PM

## 2014-08-16 NOTE — Progress Notes (Signed)
OT Cancellation Note  Patient Details Name: [name] MRN: [id number] DOB: [birth year]   Cancelled Treatment:    Reason Eval/Treat Not Completed: Patient not medically ready (BR)  Peri Maris Pager: [phone]  08/16/2014, 7:45 AM

## 2014-08-16 NOTE — Evaluation (Signed)
Physical Therapy Evaluation Patient Details Name: [name] MRN: [id number] DOB: Nov 01, 1946 Today's Date: 08/16/2014   History of Present Illness  The patient is 68 yo female admitted 08/15/14 who reports that she started having headache after exercise in Gym at noon. Her headache is located in the left orbital area. It is constant, sharp and throbbing, nonradiating. She took Advil without help. Patient also reports that she seemed to be confused when she received text message from others. She could not understand clearly about the meaning of text message and failed to response to the message transiently. The episode lasted for about 20 minutes. admitted for full workup of TIA.  Clinical Impression  Patient presents with no balance/motor deficits at this time. PT to sign off.    Follow Up Recommendations No PT follow up    Equipment Recommendations  None recommended by PT    Recommendations for Other Services       Precautions / Restrictions Precautions Precautions: None      Mobility  Bed Mobility                  Transfers Overall transfer level: Independent Equipment used: None             General transfer comment: noted up ad lib in room., has walked in hall with spouse.  Ambulation/Gait Ambulation/Gait assistance: Independent Ambulation Distance (Feet): 600 Feet Assistive device: None Gait Pattern/deviations: WFL(Within Functional Limits)   Gait velocity interpretation: at or above normal speed for age/gender    Stairs Stairs: Yes Stairs assistance: Independent Stair Management: No rails;Forwards Number of Stairs: 2 General stair comments: reciprocal, no  UE support  Wheelchair Mobility    Modified Rankin (Stroke Patients Only)       Balance Overall balance assessment: Independent         Standing balance support: No upper extremity supported;During functional activity Standing balance-Leahy Scale: Normal                High level balance activites: Direction changes;Turns;Sudden stops High Level Balance Comments: reciprocal  stepping, picks up object from floor with losses             Pertinent Vitals/Pain Pain Assessment: No/denies pain    Home Living Family/patient expects to be discharged to:: Private residence   Available Help at Discharge: Family Type of Home: House Home Access: Level entry     Home Layout: Full bath on main level Home Equipment: None      Prior Function Level of Independence: Independent               Hand Dominance        Extremity/Trunk Assessment               Lower Extremity Assessment: Overall WFL for tasks assessed      Cervical / Trunk Assessment: Normal  Communication   Communication: No difficulties  Cognition Arousal/Alertness: Awake/alert Behavior During Therapy: WFL for tasks assessed/performed Overall Cognitive Status: Within Functional Limits for tasks assessed                      General Comments      Exercises        Assessment/Plan    PT Assessment Patent does not need any further PT services  PT Diagnosis Other (comment);Altered mental status (TIA)   PT Problem List    PT Treatment Interventions     PT Goals (Current goals can be found in  the Care Plan section) Acute Rehab PT Goals PT Goal Formulation: No goals set, d/c therapy    Frequency     Barriers to discharge        Co-evaluation               End of Session   Activity Tolerance: Patient tolerated treatment well Patient left: with family/visitor present           Time: 1218-1226 PT Time Calculation (min): 8 min   Charges:   PT Evaluation $Initial PT Evaluation Tier I: 1 Procedure     PT G CodesClaretha Cooper 08/16/2014, 12:55 PM Tresa Endo PT [phone]

## 2014-08-16 NOTE — Progress Notes (Signed)
VASCULAR LAB PRELIMINARY  PRELIMINARY  PRELIMINARY  PRELIMINARY  Carotid duplex completed.    Preliminary report:  Bilateral:  1-39% ICA stenosis.  Vertebral artery flow is antegrade.      Tanna Loeffler, RVT 08/16/2014, 5:05 PM

## 2014-08-16 NOTE — Progress Notes (Signed)
Echocardiogram 2D Echocardiogram has been performed.  Lashaundra Lehrmann 08/16/2014, 4:52 PM

## 2014-08-16 NOTE — Evaluation (Signed)
SLP Cancellation Note  Patient Details Name: [name] MRN: [id number] DOB: [birth year]   Cancelled treatment:       Reason Eval/Treat Not Completed: Other (comment) (pt leaving for MRI currently, will reattempt at another time)   [name], [facility] SLP [phone]

## 2014-08-16 NOTE — Discharge Summary (Signed)
  Physician Discharge Summary  Sharryn H Slider MRN: [id number] DOB/AGE: 05/04/1946 68 y.o.  PCP: MCNEILL,WENDY, MD   Admit date: 08/15/2014 Discharge date: 08/16/2014  Discharge Diagnoses:  Probable TIA Complicated migraine   Headache Active Problems:   Hyperlipidemia   Hypertension   Hypothyroidism   Acute sinusitis  Followup recommendations Follow up with PCP in 5-7 days    Medication List    STOP taking these medications       ibuprofen 200 MG tablet  Commonly known as:  ADVIL,MOTRIN      TAKE these medications       ALIGN PO  Take 1 capsule by mouth daily.     ALPRAZolam 0.5 MG tablet  Commonly known as:  XANAX  Take 0.25 mg by mouth daily as needed for anxiety.     aspirin 81 MG tablet  Take 1 tablet (81 mg total) by mouth daily.     atorvastatin 10 MG tablet  Commonly known as:  LIPITOR  Take 10 mg by mouth every evening.     clobetasol cream 0.05 %  Commonly known as:  TEMOVATE  Apply 1 application topically daily as needed (itching).     HYDROCORTISONE EX  Apply 1 application topically 3 (three) times daily as needed (itching).     ibandronate 150 MG tablet  Commonly known as:  BONIVA  Take 150 mg by mouth every 30 (thirty) days. Take in the morning with a full glass of water, on an empty stomach, and do not take anything else by mouth or lie down for the next 30 min.     levothyroxine 125 MCG tablet  Commonly known as:  SYNTHROID, LEVOTHROID  Take 125 mcg by mouth daily before breakfast.     losartan 100 MG tablet  Commonly known as:  COZAAR  Take 100 mg by mouth every evening.     solifenacin 10 MG tablet  Commonly known as:  VESICARE  Take 10 mg by mouth every evening.     Vitamin D3 2000 UNITS Tabs  Take 2,000 Units by mouth every evening.        Discharge Condition: Stable  Disposition: Final discharge disposition not confirmed   Consults: None   Significant Diagnostic Studies: Dg Chest 2 View  08/15/2014    CLINICAL DATA:  Anterior left headache and altered mental status for 1 day. Ex-smoker.  EXAM: CHEST  2 VIEW  COMPARISON:  None.  FINDINGS: Normal sized heart. Flattening of the hemidiaphragms on the lateral view. Mild diffuse peribronchial thickening and accentuation of the interstitial markings. Diffuse osteopenia. Mild thoracic spine degenerative changes. Cholecystectomy clips.  IMPRESSION: 1. No acute abnormality. 2. Mild changes of COPD and chronic bronchitis.   Electronically Signed   By: Steve  Reid M.D.   On: 08/15/2014 19:40   Ct Head Wo Contrast  08/15/2014   CLINICAL DATA:  Headache that started at 12:30 p.m. today. She also had a brief period of confusion.  EXAM: CT HEAD WITHOUT CONTRAST  TECHNIQUE: Contiguous axial images were obtained from the base of the skull through the vertex without intravenous contrast.  COMPARISON:  Orbits CT dated 10/20/2008.  FINDINGS: Patchy white matter low density in both cerebral hemispheres. Normal size and position of the ventricles. No intracranial hemorrhage, mass lesion or CT evidence of acute infarction. Enlarged central sulcus on the left as well as focal enlargement of a more posteriorly located sulcus on the left. Right maxillary sinus air-fluid level and mild mucosal thickening. Left sphenoid   sinus mucosal thickening. Unremarkable bones.  IMPRESSION: 1. No acute abnormality. 2. Marked chronic small vessel white matter ischemic changes in both cerebral hemispheres. 3. Acute right maxillary sinusitis and chronic left sphenoid sinusitis. 4. Focal sulcal enlargement on the left, as described above. This could be due to focal atrophy or arachnoid cyst formation.   Electronically Signed   By: Steve  Reid M.D.   On: 08/15/2014 19:36     Microbiology: No results found for this or any previous visit (from the past 240 hour(s)).   Labs: Results for orders placed during the hospital encounter of 08/15/14 (from the past 48 hour(s))  CBC WITH DIFFERENTIAL      Status: None   Collection Time    08/15/14  6:38 PM      Result Value Ref Range   WBC 9.5  4.0 - 10.5 K/uL   RBC 4.82  3.87 - 5.11 MIL/uL   Hemoglobin 14.5  12.0 - 15.0 g/dL   HCT 43.1  36.0 - 46.0 %   MCV 89.4  78.0 - 100.0 fL   MCH 30.1  26.0 - 34.0 pg   MCHC 33.6  30.0 - 36.0 g/dL   RDW 13.3  11.5 - 15.5 %   Platelets 338  150 - 400 K/uL   Neutrophils Relative % 66  43 - 77 %   Neutro Abs 6.3  1.7 - 7.7 K/uL   Lymphocytes Relative 22  12 - 46 %   Lymphs Abs 2.1  0.7 - 4.0 K/uL   Monocytes Relative 8  3 - 12 %   Monocytes Absolute 0.8  0.1 - 1.0 K/uL   Eosinophils Relative 3  0 - 5 %   Eosinophils Absolute 0.3  0.0 - 0.7 K/uL   Basophils Relative 1  0 - 1 %   Basophils Absolute 0.1  0.0 - 0.1 K/uL  COMPREHENSIVE METABOLIC PANEL     Status: Abnormal   Collection Time    08/15/14  6:38 PM      Result Value Ref Range   Sodium 139  137 - 147 mEq/L   Potassium 3.7  3.7 - 5.3 mEq/L   Chloride 102  96 - 112 mEq/L   CO2 26  19 - 32 mEq/L   Glucose, Bld 93  70 - 99 mg/dL   BUN 13  6 - 23 mg/dL   Creatinine, Ser 0.95  0.50 - 1.10 mg/dL   Calcium 9.5  8.4 - 10.5 mg/dL   Total Protein 6.9  6.0 - 8.3 g/dL   Albumin 4.1  3.5 - 5.2 g/dL   AST 18  0 - 37 U/L   ALT 21  0 - 35 U/L   Alkaline Phosphatase 70  39 - 117 U/L   Total Bilirubin 0.9  0.3 - 1.2 mg/dL   GFR calc non Af Amer 61 (*) >90 mL/min   GFR calc Af Amer 70 (*) >90 mL/min   Comment: (NOTE)     The eGFR has been calculated using the CKD EPI equation.     This calculation has not been validated in all clinical situations.     eGFR's persistently <90 mL/min signify possible Chronic Kidney     Disease.   Anion gap 11  5 - 15  I-STAT TROPOININ, ED     Status: None   Collection Time    08/15/14  7:59 PM      Result Value Ref Range   Troponin i, poc 0.01  0.00 -   0.08 ng/mL   Comment 3            Comment: Due to the release kinetics of cTnI,     a negative result within the first hours     of the onset of symptoms does  not rule out     myocardial infarction with certainty.     If myocardial infarction is still suspected,     repeat the test at appropriate intervals.  URINALYSIS, ROUTINE W REFLEX MICROSCOPIC     Status: Abnormal   Collection Time    08/15/14  9:29 PM      Result Value Ref Range   Color, Urine YELLOW  YELLOW   APPearance CLEAR  CLEAR   Specific Gravity, Urine 1.013  1.005 - 1.030   pH 6.0  5.0 - 8.0   Glucose, UA NEGATIVE  NEGATIVE mg/dL   Hgb urine dipstick NEGATIVE  NEGATIVE   Bilirubin Urine NEGATIVE  NEGATIVE   Ketones, ur 15 (*) NEGATIVE mg/dL   Protein, ur NEGATIVE  NEGATIVE mg/dL   Urobilinogen, UA 0.2  0.0 - 1.0 mg/dL   Nitrite NEGATIVE  NEGATIVE   Leukocytes, UA NEGATIVE  NEGATIVE   Comment: MICROSCOPIC NOT DONE ON URINES WITH NEGATIVE PROTEIN, BLOOD, LEUKOCYTES, NITRITE, OR GLUCOSE <1000 mg/dL.     HPI :*[name] is a 67 y.o. female with PMH of hypertension, hyperlipidemia and who presents complaining of headache.  The patient reports that she started having headache after exercise in Gym at noon. Her headache is located in the left orbital area. It is constant, sharp and throbbing, nonradiating. She took Advil without help. Patient also reports that she seemed to be confused when she received text message from others. She could not understand clearly about the meaning of text message and failed to response to the message transiently. The episode lasted for about 20 minutes.  She did not have any hearing loss, weakness, numbness or tingling sensations over her extremities. She denies slurring of speech, but I found that patient has difficulty in finding words to respond to my questions. Her husband agreed with me that patient has difficulty in speech. She does not have fever, chills, cough, chest pain, shortness of breath. No history of migraine headache. Of note, a month ago patient had the blood pressure medication change. She was initially taking hydrochlorothiazide and  losartan. Her doctor discontinue hydrochlorothiazide and double her losartan dose. Currently on losartan 100 mg daily. Her blood pressure was elevated at 190/88 in ED.  She had CT-head which showed R sinusitis without other acute intracranial abnormalities  HOSPITAL COURSE:   1. Headache: Etiology is not clear, differential diagnoses include TIA/stroke given her difficulty in speech, and acute sinusitis as evidenced by CT-head. Patient neuro examination is completely normal. Given her significant risk factors, including lipidemia, hypertension, hypothyroidism and strong family medical history of stroke (both parents had stroke), patient was admitted for a full TIA workup Telemetry was stable - MRI/MRA pending - Check carotid dopplers pending Initiated on ASA 325 mg daily , patient continue with 81 mg a day - continue Lipitor  Hemoglobin A1c pending Recent lipid panel at PCP office - 12 lead EKG  - 2D transthoracic echocardiography  - PT/OT consult    2. HTN:  - hold losartan in the setting of possible TIA or stroke    3. Acute sinusitis: CT head showed acute right maxillary sinusitis and chronic left sphenoid sinusitis, but patient is asymptomatic.  Patient will be treated   with Augmentin for 10 days  4. HLD: continue home lipitor    5. hypothyroidism: Continue home Synthroid 125 mcg daily. , TSH pending    Discharge Exam:  Blood pressure 102/48, pulse 66, temperature 98.4 F (36.9 C), temperature source Oral, resp. rate 18, height 5' 2" (1.575 m), weight 67.6 kg (149 lb 0.5 oz), SpO2 96.00%.  General: Not in acute distress  HEENT: there is no tenderness over her frontal and the maxillary sinuses.  Eyes: PERRL, EOMI, no scleral icterus  ENT: No discharge from the ears and nose, no pharynx injection, no tonsillar enlargement.  Neck: No JVD, no bruit, no mass felt.  Cardiac: S1/S2, RRR, No murmurs, gallops or rubs  Pulm: Good air movement bilaterally. Clear to auscultation  bilaterally. No rales, wheezing, rhonchi or rubs.  Abd: Soft, nondistended, nontender, no rebound pain, no organomegaly, BS present  Ext: No edema. 2+DP/PT pulse bilaterally  Musculoskeletal: No joint deformities, erythema, or stiffness, ROM full  Skin: No rashes.  Neuro: Alert and oriented X3, cranial nerves II-XII grossly intact, muscle strength 5/5 in all extremeties, sensation to light touch intact. Brachial reflex 2+ bilaterally. Knee reflex 1+ bilaterally. Negative Babinski's sign. Normal finger to nose test.  Psych: Patient is not psychotic, no suicidal or hemocidal ideation.        Signed: , 08/16/2014, 9:59 AM          

## 2014-08-17 DIAGNOSIS — I10 Essential (primary) hypertension: Secondary | ICD-10-CM

## 2014-08-17 DIAGNOSIS — G459 Transient cerebral ischemic attack, unspecified: Secondary | ICD-10-CM | POA: Diagnosis present

## 2014-08-17 DIAGNOSIS — G44209 Tension-type headache, unspecified, not intractable: Secondary | ICD-10-CM

## 2014-08-17 DIAGNOSIS — E785 Hyperlipidemia, unspecified: Secondary | ICD-10-CM

## 2014-08-17 MED ORDER — AMOXICILLIN-POT CLAVULANATE 875-125 MG PO TABS: 1.0000 | ORAL_TABLET | Freq: Two times a day (BID) | ORAL | Status: AC

## 2014-08-17 MED ORDER — ASPIRIN 81 MG PO TABS: 81.0000 mg | ORAL_TABLET | Freq: Every day | ORAL | Status: DC

## 2014-08-17 MED ORDER — LOSARTAN POTASSIUM 50 MG PO TABS: 100.0000 mg | ORAL_TABLET | Freq: Every evening | ORAL | Status: DC

## 2014-08-17 MED ORDER — ASPIRIN EC 81 MG PO TBEC
81.0000 mg | DELAYED_RELEASE_TABLET | Freq: Every day | ORAL | Status: DC
  Administered 2014-08-17: 81 mg via ORAL
  Filled 2014-08-17: qty 1

## 2014-08-17 NOTE — Progress Notes (Signed)
OT Discharge Note  Patient Details Name: [name] MRN: [id number] DOB: October 04, 1946   Cancelled Treatment:    Reason Eval/Treat Not Completed: OT screened, no needs identified, will sign off. OT spoke with patient directly and no acute needs identified. Pt standing using cell phone and writing on arrival. Spouse present and reports pt appears to be at baseline  Spouse and Pt have questions regarding MRI impression statement. RN [facility] notified. Pt and spouse advised to direct questions directly to MD.  Peri Maris Pager: [phone]  08/17/2014, 9:15 AM

## 2014-08-17 NOTE — Discharge Summary (Signed)
Physician Discharge Summary  Patient ID: [name] MRN: [id number] DOB/AGE: 17-Aug-1946 68 y.o.  Admit date: 08/15/2014 Discharge date: 08/17/2014  Primary Care Physician:  Cari Caraway, MD  Discharge Diagnoses:    . Headache possibly due to probable TIA versus complicated migraine  . Hyperlipidemia . Hypertension- accelerated  . Hypothyroidism . Acute sinusitis  Consults: None   Recommendations for Outpatient Follow-up:  Please followup HbA1c and lipid panel in 3 months  Allergies:   Allergies  Allergen Reactions  . Erythromycin Itching and Other (See Comments)    Severe stomach pains, diarrhea     Discharge Medications:   Medication List    STOP taking these medications       ibuprofen 200 MG tablet  Commonly known as:  ADVIL,MOTRIN      TAKE these medications       ALIGN PO  Take 1 capsule by mouth daily.     ALPRAZolam 0.5 MG tablet  Commonly known as:  XANAX  Take 0.25 mg by mouth daily as needed for anxiety.     amoxicillin-clavulanate 875-125 MG per tablet  Commonly known as:  AUGMENTIN  Take 1 tablet by mouth 2 (two) times daily. X 10 days     aspirin 81 MG tablet  Take 1 tablet (81 mg total) by mouth daily.     atorvastatin 10 MG tablet  Commonly known as:  LIPITOR  Take 10 mg by mouth every evening.     clobetasol cream 0.05 %  Commonly known as:  TEMOVATE  Apply 1 application topically daily as needed (itching).     HYDROCORTISONE EX  Apply 1 application topically 3 (three) times daily as needed (itching).     ibandronate 150 MG tablet  Commonly known as:  BONIVA  Take 150 mg by mouth every 30 (thirty) days. Take in the morning with a full glass of water, on an empty stomach, and do not take anything else by mouth or lie down for the next 30 min.     levothyroxine 125 MCG tablet  Commonly known as:  SYNTHROID, LEVOTHROID  Take 125 mcg by mouth daily before breakfast.     losartan 100 MG tablet  Commonly known as:  COZAAR   Take 100 mg by mouth every evening.     solifenacin 10 MG tablet  Commonly known as:  VESICARE  Take 10 mg by mouth every evening.     Vitamin D3 2000 UNITS Tabs  Take 2,000 Units by mouth every evening.         Brief H and P: For complete details please refer to admission H and P, but in brief [name] is a 68 y.o. female with PMH of hypertension, hyperlipidemia and who presents complaining of headache.  The patient reported that she started having headache after exercise in Gym at noon. Her headache was located in the left orbital area. It was constant, sharp and throbbing, nonradiating. She took Advil without help. Patient also reported that she seemed to be confused when she received text message from others. She could not understand clearly about the meaning of text message and failed to response to the message transiently. The episode lasted for about 20 minutes.  She did not have any hearing loss, weakness, numbness or tingling sensations over her extremities. She denied slurring of speech but patient was found to have difficulty in finding words by the admitting physician. She did not have fever, chills, cough, chest pain, shortness of breath. No history  of migraine headache. Of note, a month ago patient had the blood pressure medication change. She was initially taking hydrochlorothiazide and losartan. Her doctor discontinue hydrochlorothiazide and double her losartan dose. Currently on losartan 100 mg daily. Her blood pressure was elevated at 190/88 in ED.  She had CT-head which showed R sinusitis without other acute intracranial abnormalities. She is admitted to the inpatient for further evaluation and treatment.   Hospital Course:     Headache: Likely due to sinusitis headache versus probable TIA versus completed migraine. CT head showed acute sinusitis. Patient's neuro examination was completely normal. Given her significant risk factors, including lipidemia,  hypertension, hypothyroidism and strong family medical history of stroke (both parents had stroke), patient was admitted for a full TIA workup. MRI of the brain showed no acute intracranial abnormality, very advanced signal changes in the brain suggesting chronic small vessel disease, negative intracranial MRA.  2-D echo showed EF of 60%, normal wall motion, no cardiac source of embolism. Carotid Dopplers showed 1-39% stenosis involving right and left internal carotid artery. Hemoglobin A1c 5.9, patient was recommended carb controlled diet and exercise Patient had a lipid panel checked by her PCP last month which showed cholesterol 153, triglycerides 80, HDL 49, LDL 88, patient was recommended to continue with statins  Active Problems:   Hyperlipidemia Patient had a lipid panel checked by her PCP last month which showed cholesterol 153, triglycerides 80, HDL 49, LDL 88, patient was recommended to continue with statins    Hypertension - For now continue with losartan, may need to add another agent/antihypertensive by PCP    Hypothyroidism - TSH 3.2, continue Synthroid    Acute sinusitis -Placed on Augmentin for 10 days  Day of Discharge BP 138/79  Pulse 73  Temp(Src) 98.2 F (36.8 C) (Oral)  Resp 18  Ht 5\' 2"  (1.575 m)  Wt 67.6 kg (149 lb 0.5 oz)  BMI 27.25 kg/m2  SpO2 98%  Physical Exam: General: Alert and awake oriented x3 not in any acute distress. HEENT: anicteric sclera, pupils reactive to light and accommodation CVS: S1-S2 clear no murmur rubs or gallops Chest: clear to auscultation bilaterally, no wheezing rales or rhonchi Abdomen: soft nontender, nondistended, normal bowel sounds Extremities: no cyanosis, clubbing or edema noted bilaterally Neuro: Cranial nerves II-XII intact, no focal neurological deficits   The results of significant diagnostics from this hospitalization (including imaging, microbiology, ancillary and laboratory) are listed below for reference.     LAB RESULTS: Basic Metabolic Panel:  Recent Labs Lab 08/15/14 1838  NA 139  K 3.7  CL 102  CO2 26  GLUCOSE 93  BUN 13  CREATININE 0.95  CALCIUM 9.5   Liver Function Tests:  Recent Labs Lab 08/15/14 1838  AST 18  ALT 21  ALKPHOS 70  BILITOT 0.9  PROT 6.9  ALBUMIN 4.1   No results found for this basename: LIPASE, AMYLASE,  in the last 168 hours No results found for this basename: AMMONIA,  in the last 168 hours CBC:  Recent Labs Lab 08/15/14 1838  WBC 9.5  NEUTROABS 6.3  HGB 14.5  HCT 43.1  MCV 89.4  PLT 338   Cardiac Enzymes:  Recent Labs Lab 08/16/14 1130  TROPONINI <0.30   BNP: No components found with this basename: POCBNP,  CBG: No results found for this basename: GLUCAP,  in the last 168 hours  Significant Diagnostic Studies:  Dg Chest 2 View  08/15/2014   CLINICAL DATA:  Anterior left headache and altered mental  status for 1 day. Ex-smoker.  EXAM: CHEST  2 VIEW  COMPARISON:  None.  FINDINGS: Normal sized heart. Flattening of the hemidiaphragms on the lateral view. Mild diffuse peribronchial thickening and accentuation of the interstitial markings. Diffuse osteopenia. Mild thoracic spine degenerative changes. Cholecystectomy clips.  IMPRESSION: 1. No acute abnormality. 2. Mild changes of COPD and chronic bronchitis.   Electronically Signed   By: Enrique Sack M.D.   On: 08/15/2014 19:40   Ct Head Wo Contrast  08/15/2014   CLINICAL DATA:  Headache that started at 12:30 p.m. today. She also had a brief period of confusion.  EXAM: CT HEAD WITHOUT CONTRAST  TECHNIQUE: Contiguous axial images were obtained from the base of the skull through the vertex without intravenous contrast.  COMPARISON:  Orbits CT dated 10/20/2008.  FINDINGS: Patchy white matter low density in both cerebral hemispheres. Normal size and position of the ventricles. No intracranial hemorrhage, mass lesion or CT evidence of acute infarction. Enlarged central sulcus on the left as well as  focal enlargement of a more posteriorly located sulcus on the left. Right maxillary sinus air-fluid level and mild mucosal thickening. Left sphenoid sinus mucosal thickening. Unremarkable bones.  IMPRESSION: 1. No acute abnormality. 2. Marked chronic small vessel white matter ischemic changes in both cerebral hemispheres. 3. Acute right maxillary sinusitis and chronic left sphenoid sinusitis. 4. Focal sulcal enlargement on the left, as described above. This could be due to focal atrophy or arachnoid cyst formation.   Electronically Signed   By: Enrique Sack M.D.   On: 08/15/2014 19:36   Mr Virgel Paling Wo Contrast  08/16/2014   CLINICAL DATA:  68 year old female with acute onset headache located in the left orbit region, constant sharp and non radiating. Subsequent confusion for about 20 min. Changes in speech. Initial encounter.  EXAM: MRI HEAD WITHOUT CONTRAST  MRA HEAD WITHOUT CONTRAST  TECHNIQUE: Multiplanar, multiecho pulse sequences of the brain and surrounding structures were obtained without intravenous contrast. Angiographic images of the head were obtained using MRA technique without contrast.  COMPARISON:  Head CT without contrast 08/15/2014 and earlier.  FINDINGS: MRI HEAD FINDINGS  Cerebral volume is within normal limits for age. No restricted diffusion to suggest acute infarction. No midline shift, mass effect, evidence of mass lesion, ventriculomegaly, extra-axial collection or acute intracranial hemorrhage. Cervicomedullary junction and pituitary are within normal limits. Negative visualized cervical spine. Major intracranial vascular flow voids are preserved.  Widespread and confluent abnormal T2 and FLAIR hyperintensity in the cerebral white matter, an the pons. Susceptibility weighted images positive for or numerous chronic micro hemorrhages scattered throughout the brain (series 9) including in the brainstem and cerebellum. No changes suggesting prior macroscopic hemorrhage. No definite cortical  encephalomalacia.  Visible internal auditory structures appear normal. Mastoids are clear. Visualized orbit soft tissues are within normal limits. There is some paranasal sinus mucosal thickening primarily in the right maxillary sinus and left sphenoid sinus. Possible fluid level in the right maxillary. Visualized scalp soft tissues are within normal limits. Normal bone marrow signal.  MRA HEAD FINDINGS  Antegrade flow in the posterior circulation with fairly codominant distal vertebral arteries. Normal PICA origins. Normal vertebrobasilar junction. No basilar artery stenosis. SCA and PCA origins are normal. Posterior communicating arteries are diminutive or absent. Bilateral PCA branches are within normal limits.  Antegrade flow in both ICA siphons. No siphon stenosis. Ophthalmic artery origins are within normal limits.  The carotid termini are patent. The MCA and ACA origins are within normal limits,  however there is an unusual fenestrated or duplicated appearance of vessels about the bilateral ACA A1 segments. The anterior communicating artery also appears to be fenestrated. This most resembles normal anatomic variation, and in fact a prominent right MCA (sylvian) branch appears to arise from the right A1 segment (see series 504, image 10).  Otherwise negative visualized bilateral ACA branches. Visualized bilateral MCA branches are within normal limits.  IMPRESSION: 1.  No acute intracranial abnormality. 2. Very advanced signal changes in the brain suggesting chronic small vessel disease, including numerous chronic micro hemorrhages. 3. Negative intracranial MRA. Normal anatomic variation of the anterior circulation.   Electronically Signed   By: Lars Pinks M.D.   On: 08/16/2014 11:10   Mr Brain Wo Contrast  08/16/2014   CLINICAL DATA:  68 year old female with acute onset headache located in the left orbit region, constant sharp and non radiating. Subsequent confusion for about 20 min. Changes in speech.  Initial encounter.  EXAM: MRI HEAD WITHOUT CONTRAST  MRA HEAD WITHOUT CONTRAST  TECHNIQUE: Multiplanar, multiecho pulse sequences of the brain and surrounding structures were obtained without intravenous contrast. Angiographic images of the head were obtained using MRA technique without contrast.  COMPARISON:  Head CT without contrast 08/15/2014 and earlier.  FINDINGS: MRI HEAD FINDINGS  Cerebral volume is within normal limits for age. No restricted diffusion to suggest acute infarction. No midline shift, mass effect, evidence of mass lesion, ventriculomegaly, extra-axial collection or acute intracranial hemorrhage. Cervicomedullary junction and pituitary are within normal limits. Negative visualized cervical spine. Major intracranial vascular flow voids are preserved.  Widespread and confluent abnormal T2 and FLAIR hyperintensity in the cerebral white matter, an the pons. Susceptibility weighted images positive for or numerous chronic micro hemorrhages scattered throughout the brain (series 9) including in the brainstem and cerebellum. No changes suggesting prior macroscopic hemorrhage. No definite cortical encephalomalacia.  Visible internal auditory structures appear normal. Mastoids are clear. Visualized orbit soft tissues are within normal limits. There is some paranasal sinus mucosal thickening primarily in the right maxillary sinus and left sphenoid sinus. Possible fluid level in the right maxillary. Visualized scalp soft tissues are within normal limits. Normal bone marrow signal.  MRA HEAD FINDINGS  Antegrade flow in the posterior circulation with fairly codominant distal vertebral arteries. Normal PICA origins. Normal vertebrobasilar junction. No basilar artery stenosis. SCA and PCA origins are normal. Posterior communicating arteries are diminutive or absent. Bilateral PCA branches are within normal limits.  Antegrade flow in both ICA siphons. No siphon stenosis. Ophthalmic artery origins are within  normal limits.  The carotid termini are patent. The MCA and ACA origins are within normal limits, however there is an unusual fenestrated or duplicated appearance of vessels about the bilateral ACA A1 segments. The anterior communicating artery also appears to be fenestrated. This most resembles normal anatomic variation, and in fact a prominent right MCA (sylvian) branch appears to arise from the right A1 segment (see series 504, image 10).  Otherwise negative visualized bilateral ACA branches. Visualized bilateral MCA branches are within normal limits.  IMPRESSION: 1.  No acute intracranial abnormality. 2. Very advanced signal changes in the brain suggesting chronic small vessel disease, including numerous chronic micro hemorrhages. 3. Negative intracranial MRA. Normal anatomic variation of the anterior circulation.   Electronically Signed   By: Lars Pinks M.D.   On: 08/16/2014 11:10    2D ECHO: Study Conclusions  - Left ventricle: The cavity size was normal. Wall thickness was normal. The estimated ejection fraction was  60%. Wall motion was normal; there were no regional wall motion abnormalities. - Right ventricle: The cavity size was normal. Systolic function was normal. - Impressions: No cardiac source of embolism was identified, but cannot be ruled out on the basis of this examination.  Impressions:  - No cardiac source of embolism was identified, but cannot be ruled out on the basis of this examination.    Disposition and Follow-up:    DISPOSITION: Home  DIET: Carb modified diet    DISCHARGE FOLLOW-UP     Follow-up Information   Follow up with MCNEILL,WENDY, MD. Schedule an appointment as soon as possible for a visit in 2 weeks. (for hospital follow-up)    Specialty:  Family Medicine   Contact information:   [place] [address] [phone]       Time spent on Discharge: 35 mins  Signed:   Alizay Bronkema M.D. [facility] 08/17/2014,  10:13 AM Pager: [phone]

## 2014-08-17 NOTE — Progress Notes (Signed)
UR complete.  Demani Weyrauch RN, MSN 

## 2014-08-17 NOTE — ED Provider Notes (Signed)
Medical screening examination/treatment/procedure(s) were performed by non-physician practitioner and as supervising physician I was immediately available for consultation/collaboration.   EKG Interpretation   Date/Time:  Monday August 15 2014 19:47:33 EDT Ventricular Rate:  73 PR Interval:  154 QRS Duration: 85 QT Interval:  401 QTC Calculation: 442 R Axis:   43 Text Interpretation:  Age not entered, assumed to be  68 years old for  purpose of ECG interpretation Sinus rhythm Baseline wander in lead(s) V6  ED PHYSICIAN INTERPRETATION AVAILABLE IN [facility] [place] Confirmed by  TEST, Record ([id number]) on 08/17/2014 6:55:29 AM        Francine Graven, DO 08/17/14 1627

## 2014-08-17 NOTE — Progress Notes (Signed)
Pt is being discharged home with her family. Discharge instruction included but not limited to med administration , sign of stroke, when to call a Dr were given to patient and family

## 2014-09-05 ENCOUNTER — Other Ambulatory Visit: Payer: Self-pay | Admitting: Obstetrics and Gynecology

## 2016-05-28 ENCOUNTER — Other Ambulatory Visit: Payer: Self-pay | Admitting: Orthopedic Surgery

## 2017-01-01 DIAGNOSIS — R69 Illness, unspecified: Secondary | ICD-10-CM | POA: Diagnosis not present

## 2017-01-10 DIAGNOSIS — J449 Chronic obstructive pulmonary disease, unspecified: Secondary | ICD-10-CM | POA: Diagnosis not present

## 2017-01-10 DIAGNOSIS — N3941 Urge incontinence: Secondary | ICD-10-CM | POA: Diagnosis not present

## 2017-01-10 DIAGNOSIS — I10 Essential (primary) hypertension: Secondary | ICD-10-CM | POA: Diagnosis not present

## 2017-01-10 DIAGNOSIS — E039 Hypothyroidism, unspecified: Secondary | ICD-10-CM | POA: Diagnosis not present

## 2017-01-10 DIAGNOSIS — R7301 Impaired fasting glucose: Secondary | ICD-10-CM | POA: Diagnosis not present

## 2017-01-10 DIAGNOSIS — K219 Gastro-esophageal reflux disease without esophagitis: Secondary | ICD-10-CM | POA: Diagnosis not present

## 2017-01-10 DIAGNOSIS — Z122 Encounter for screening for malignant neoplasm of respiratory organs: Secondary | ICD-10-CM | POA: Diagnosis not present

## 2017-01-10 DIAGNOSIS — M810 Age-related osteoporosis without current pathological fracture: Secondary | ICD-10-CM | POA: Diagnosis not present

## 2017-01-10 DIAGNOSIS — E782 Mixed hyperlipidemia: Secondary | ICD-10-CM | POA: Diagnosis not present

## 2017-01-13 ENCOUNTER — Other Ambulatory Visit (HOSPITAL_COMMUNITY): Payer: Self-pay | Admitting: Family Medicine

## 2017-01-16 DIAGNOSIS — R7301 Impaired fasting glucose: Secondary | ICD-10-CM | POA: Diagnosis not present

## 2017-01-16 DIAGNOSIS — M810 Age-related osteoporosis without current pathological fracture: Secondary | ICD-10-CM | POA: Diagnosis not present

## 2017-03-06 DIAGNOSIS — R69 Illness, unspecified: Secondary | ICD-10-CM | POA: Diagnosis not present

## 2017-04-08 DIAGNOSIS — S80812A Abrasion, left lower leg, initial encounter: Secondary | ICD-10-CM | POA: Diagnosis not present

## 2017-05-19 DIAGNOSIS — R69 Illness, unspecified: Secondary | ICD-10-CM | POA: Diagnosis not present

## 2017-07-08 DIAGNOSIS — R69 Illness, unspecified: Secondary | ICD-10-CM | POA: Diagnosis not present

## 2017-07-22 DIAGNOSIS — K219 Gastro-esophageal reflux disease without esophagitis: Secondary | ICD-10-CM | POA: Diagnosis not present

## 2017-07-22 DIAGNOSIS — E559 Vitamin D deficiency, unspecified: Secondary | ICD-10-CM | POA: Diagnosis not present

## 2017-07-22 DIAGNOSIS — R7303 Prediabetes: Secondary | ICD-10-CM | POA: Diagnosis not present

## 2017-07-22 DIAGNOSIS — E039 Hypothyroidism, unspecified: Secondary | ICD-10-CM | POA: Diagnosis not present

## 2017-07-22 DIAGNOSIS — Z23 Encounter for immunization: Secondary | ICD-10-CM | POA: Diagnosis not present

## 2017-07-22 DIAGNOSIS — I10 Essential (primary) hypertension: Secondary | ICD-10-CM | POA: Diagnosis not present

## 2017-07-22 DIAGNOSIS — J449 Chronic obstructive pulmonary disease, unspecified: Secondary | ICD-10-CM | POA: Diagnosis not present

## 2017-07-22 DIAGNOSIS — M810 Age-related osteoporosis without current pathological fracture: Secondary | ICD-10-CM | POA: Diagnosis not present

## 2017-07-22 DIAGNOSIS — N3941 Urge incontinence: Secondary | ICD-10-CM | POA: Diagnosis not present

## 2017-07-22 DIAGNOSIS — E782 Mixed hyperlipidemia: Secondary | ICD-10-CM | POA: Diagnosis not present

## 2017-08-08 ENCOUNTER — Telehealth: Payer: Self-pay | Admitting: Acute Care

## 2017-08-11 NOTE — Telephone Encounter (Signed)
Will forward to lung nodule pool.  

## 2017-08-12 NOTE — Telephone Encounter (Signed)
Spoke with pt to discuss SDMV and CT.  Pt is aware that I need to discuss her referral with Eric Form, NP and will call her back .

## 2017-08-15 NOTE — Telephone Encounter (Signed)
Spoke with pt and let her know that per Eric Form, NP she does not qualify fir the lung cancer screeing due to quit smoking greater than 15 years ago.  Pt verbalized understanding.  Letter sent to Dr Theadore Nan to make her aware.

## 2017-09-09 DIAGNOSIS — R05 Cough: Secondary | ICD-10-CM | POA: Diagnosis not present

## 2017-09-09 DIAGNOSIS — R062 Wheezing: Secondary | ICD-10-CM | POA: Diagnosis not present

## 2017-10-16 DIAGNOSIS — J449 Chronic obstructive pulmonary disease, unspecified: Secondary | ICD-10-CM | POA: Diagnosis not present

## 2017-10-16 DIAGNOSIS — R69 Illness, unspecified: Secondary | ICD-10-CM | POA: Diagnosis not present

## 2017-10-16 DIAGNOSIS — K219 Gastro-esophageal reflux disease without esophagitis: Secondary | ICD-10-CM | POA: Diagnosis not present

## 2018-01-08 DIAGNOSIS — N3281 Overactive bladder: Secondary | ICD-10-CM | POA: Diagnosis not present

## 2018-01-08 DIAGNOSIS — Z6830 Body mass index (BMI) 30.0-30.9, adult: Secondary | ICD-10-CM | POA: Diagnosis not present

## 2018-01-08 DIAGNOSIS — Z01419 Encounter for gynecological examination (general) (routine) without abnormal findings: Secondary | ICD-10-CM | POA: Diagnosis not present

## 2018-01-08 DIAGNOSIS — M8588 Other specified disorders of bone density and structure, other site: Secondary | ICD-10-CM | POA: Diagnosis not present

## 2018-01-08 DIAGNOSIS — N958 Other specified menopausal and perimenopausal disorders: Secondary | ICD-10-CM | POA: Diagnosis not present

## 2018-01-08 DIAGNOSIS — Z1231 Encounter for screening mammogram for malignant neoplasm of breast: Secondary | ICD-10-CM | POA: Diagnosis not present

## 2018-01-08 DIAGNOSIS — L904 Acrodermatitis chronica atrophicans: Secondary | ICD-10-CM | POA: Diagnosis not present

## 2018-01-20 DIAGNOSIS — K219 Gastro-esophageal reflux disease without esophagitis: Secondary | ICD-10-CM | POA: Diagnosis not present

## 2018-01-20 DIAGNOSIS — M810 Age-related osteoporosis without current pathological fracture: Secondary | ICD-10-CM | POA: Diagnosis not present

## 2018-01-20 DIAGNOSIS — I10 Essential (primary) hypertension: Secondary | ICD-10-CM | POA: Diagnosis not present

## 2018-01-20 DIAGNOSIS — N3941 Urge incontinence: Secondary | ICD-10-CM | POA: Diagnosis not present

## 2018-01-20 DIAGNOSIS — R69 Illness, unspecified: Secondary | ICD-10-CM | POA: Diagnosis not present

## 2018-01-20 DIAGNOSIS — E559 Vitamin D deficiency, unspecified: Secondary | ICD-10-CM | POA: Diagnosis not present

## 2018-01-20 DIAGNOSIS — E782 Mixed hyperlipidemia: Secondary | ICD-10-CM | POA: Diagnosis not present

## 2018-01-20 DIAGNOSIS — E039 Hypothyroidism, unspecified: Secondary | ICD-10-CM | POA: Diagnosis not present

## 2018-01-20 DIAGNOSIS — R7303 Prediabetes: Secondary | ICD-10-CM | POA: Diagnosis not present

## 2018-01-20 DIAGNOSIS — J449 Chronic obstructive pulmonary disease, unspecified: Secondary | ICD-10-CM | POA: Diagnosis not present

## 2018-03-05 DIAGNOSIS — R69 Illness, unspecified: Secondary | ICD-10-CM | POA: Diagnosis not present

## 2018-06-18 DIAGNOSIS — H5203 Hypermetropia, bilateral: Secondary | ICD-10-CM | POA: Diagnosis not present

## 2018-06-18 DIAGNOSIS — H2513 Age-related nuclear cataract, bilateral: Secondary | ICD-10-CM | POA: Diagnosis not present

## 2018-06-18 DIAGNOSIS — H524 Presbyopia: Secondary | ICD-10-CM | POA: Diagnosis not present

## 2018-07-28 DIAGNOSIS — R7303 Prediabetes: Secondary | ICD-10-CM | POA: Diagnosis not present

## 2018-07-28 DIAGNOSIS — M810 Age-related osteoporosis without current pathological fracture: Secondary | ICD-10-CM | POA: Diagnosis not present

## 2018-07-28 DIAGNOSIS — J449 Chronic obstructive pulmonary disease, unspecified: Secondary | ICD-10-CM | POA: Diagnosis not present

## 2018-07-28 DIAGNOSIS — E039 Hypothyroidism, unspecified: Secondary | ICD-10-CM | POA: Diagnosis not present

## 2018-07-28 DIAGNOSIS — Z0000 Encounter for general adult medical examination without abnormal findings: Secondary | ICD-10-CM | POA: Diagnosis not present

## 2018-07-28 DIAGNOSIS — I10 Essential (primary) hypertension: Secondary | ICD-10-CM | POA: Diagnosis not present

## 2018-07-28 DIAGNOSIS — R69 Illness, unspecified: Secondary | ICD-10-CM | POA: Diagnosis not present

## 2018-07-28 DIAGNOSIS — E782 Mixed hyperlipidemia: Secondary | ICD-10-CM | POA: Diagnosis not present

## 2018-07-28 DIAGNOSIS — K219 Gastro-esophageal reflux disease without esophagitis: Secondary | ICD-10-CM | POA: Diagnosis not present

## 2018-07-28 DIAGNOSIS — Z1389 Encounter for screening for other disorder: Secondary | ICD-10-CM | POA: Diagnosis not present

## 2018-08-06 DIAGNOSIS — R69 Illness, unspecified: Secondary | ICD-10-CM | POA: Diagnosis not present

## 2018-11-23 DIAGNOSIS — K5730 Diverticulosis of large intestine without perforation or abscess without bleeding: Secondary | ICD-10-CM | POA: Diagnosis not present

## 2018-11-23 DIAGNOSIS — K648 Other hemorrhoids: Secondary | ICD-10-CM | POA: Diagnosis not present

## 2018-11-23 DIAGNOSIS — D122 Benign neoplasm of ascending colon: Secondary | ICD-10-CM | POA: Diagnosis not present

## 2018-11-23 DIAGNOSIS — Z86010 Personal history of colonic polyps: Secondary | ICD-10-CM | POA: Diagnosis not present

## 2018-11-25 DIAGNOSIS — D122 Benign neoplasm of ascending colon: Secondary | ICD-10-CM | POA: Diagnosis not present

## 2019-01-07 DIAGNOSIS — R69 Illness, unspecified: Secondary | ICD-10-CM | POA: Diagnosis not present

## 2019-02-01 DIAGNOSIS — E875 Hyperkalemia: Secondary | ICD-10-CM | POA: Diagnosis not present

## 2019-02-01 DIAGNOSIS — E039 Hypothyroidism, unspecified: Secondary | ICD-10-CM | POA: Diagnosis not present

## 2019-02-01 DIAGNOSIS — K219 Gastro-esophageal reflux disease without esophagitis: Secondary | ICD-10-CM | POA: Diagnosis not present

## 2019-02-01 DIAGNOSIS — Z79899 Other long term (current) drug therapy: Secondary | ICD-10-CM | POA: Diagnosis not present

## 2019-02-01 DIAGNOSIS — M810 Age-related osteoporosis without current pathological fracture: Secondary | ICD-10-CM | POA: Diagnosis not present

## 2019-02-01 DIAGNOSIS — I10 Essential (primary) hypertension: Secondary | ICD-10-CM | POA: Diagnosis not present

## 2019-02-01 DIAGNOSIS — E782 Mixed hyperlipidemia: Secondary | ICD-10-CM | POA: Diagnosis not present

## 2019-02-01 DIAGNOSIS — R7303 Prediabetes: Secondary | ICD-10-CM | POA: Diagnosis not present

## 2019-02-01 DIAGNOSIS — J449 Chronic obstructive pulmonary disease, unspecified: Secondary | ICD-10-CM | POA: Diagnosis not present

## 2019-02-02 DIAGNOSIS — E039 Hypothyroidism, unspecified: Secondary | ICD-10-CM | POA: Diagnosis not present

## 2019-02-02 DIAGNOSIS — K219 Gastro-esophageal reflux disease without esophagitis: Secondary | ICD-10-CM | POA: Diagnosis not present

## 2019-02-02 DIAGNOSIS — J449 Chronic obstructive pulmonary disease, unspecified: Secondary | ICD-10-CM | POA: Diagnosis not present

## 2019-02-02 DIAGNOSIS — E875 Hyperkalemia: Secondary | ICD-10-CM | POA: Diagnosis not present

## 2019-02-02 DIAGNOSIS — Z79899 Other long term (current) drug therapy: Secondary | ICD-10-CM | POA: Diagnosis not present

## 2019-02-02 DIAGNOSIS — M810 Age-related osteoporosis without current pathological fracture: Secondary | ICD-10-CM | POA: Diagnosis not present

## 2019-02-02 DIAGNOSIS — E782 Mixed hyperlipidemia: Secondary | ICD-10-CM | POA: Diagnosis not present

## 2019-02-02 DIAGNOSIS — R7303 Prediabetes: Secondary | ICD-10-CM | POA: Diagnosis not present

## 2019-02-02 DIAGNOSIS — I10 Essential (primary) hypertension: Secondary | ICD-10-CM | POA: Diagnosis not present

## 2019-06-06 DIAGNOSIS — I10 Essential (primary) hypertension: Secondary | ICD-10-CM | POA: Diagnosis not present

## 2019-06-06 DIAGNOSIS — M810 Age-related osteoporosis without current pathological fracture: Secondary | ICD-10-CM | POA: Diagnosis not present

## 2019-06-06 DIAGNOSIS — E039 Hypothyroidism, unspecified: Secondary | ICD-10-CM | POA: Diagnosis not present

## 2019-06-06 DIAGNOSIS — E782 Mixed hyperlipidemia: Secondary | ICD-10-CM | POA: Diagnosis not present

## 2019-06-06 DIAGNOSIS — M1812 Unilateral primary osteoarthritis of first carpometacarpal joint, left hand: Secondary | ICD-10-CM | POA: Diagnosis not present

## 2019-06-06 DIAGNOSIS — J449 Chronic obstructive pulmonary disease, unspecified: Secondary | ICD-10-CM | POA: Diagnosis not present

## 2019-06-14 DIAGNOSIS — J449 Chronic obstructive pulmonary disease, unspecified: Secondary | ICD-10-CM | POA: Diagnosis not present

## 2019-06-14 DIAGNOSIS — I10 Essential (primary) hypertension: Secondary | ICD-10-CM | POA: Diagnosis not present

## 2019-06-14 DIAGNOSIS — E782 Mixed hyperlipidemia: Secondary | ICD-10-CM | POA: Diagnosis not present

## 2019-06-14 DIAGNOSIS — M1812 Unilateral primary osteoarthritis of first carpometacarpal joint, left hand: Secondary | ICD-10-CM | POA: Diagnosis not present

## 2019-06-14 DIAGNOSIS — E039 Hypothyroidism, unspecified: Secondary | ICD-10-CM | POA: Diagnosis not present

## 2019-06-14 DIAGNOSIS — M810 Age-related osteoporosis without current pathological fracture: Secondary | ICD-10-CM | POA: Diagnosis not present

## 2019-06-21 DIAGNOSIS — H2513 Age-related nuclear cataract, bilateral: Secondary | ICD-10-CM | POA: Diagnosis not present

## 2019-06-21 DIAGNOSIS — H5203 Hypermetropia, bilateral: Secondary | ICD-10-CM | POA: Diagnosis not present

## 2019-08-06 DIAGNOSIS — K219 Gastro-esophageal reflux disease without esophagitis: Secondary | ICD-10-CM | POA: Diagnosis not present

## 2019-08-06 DIAGNOSIS — Z1389 Encounter for screening for other disorder: Secondary | ICD-10-CM | POA: Diagnosis not present

## 2019-08-06 DIAGNOSIS — Z0000 Encounter for general adult medical examination without abnormal findings: Secondary | ICD-10-CM | POA: Diagnosis not present

## 2019-08-06 DIAGNOSIS — I10 Essential (primary) hypertension: Secondary | ICD-10-CM | POA: Diagnosis not present

## 2019-08-06 DIAGNOSIS — E782 Mixed hyperlipidemia: Secondary | ICD-10-CM | POA: Diagnosis not present

## 2019-08-06 DIAGNOSIS — J449 Chronic obstructive pulmonary disease, unspecified: Secondary | ICD-10-CM | POA: Diagnosis not present

## 2019-08-06 DIAGNOSIS — M810 Age-related osteoporosis without current pathological fracture: Secondary | ICD-10-CM | POA: Diagnosis not present

## 2019-08-06 DIAGNOSIS — R7303 Prediabetes: Secondary | ICD-10-CM | POA: Diagnosis not present

## 2019-08-06 DIAGNOSIS — E559 Vitamin D deficiency, unspecified: Secondary | ICD-10-CM | POA: Diagnosis not present

## 2019-08-06 DIAGNOSIS — E039 Hypothyroidism, unspecified: Secondary | ICD-10-CM | POA: Diagnosis not present

## 2019-08-13 DIAGNOSIS — E039 Hypothyroidism, unspecified: Secondary | ICD-10-CM | POA: Diagnosis not present

## 2019-08-13 DIAGNOSIS — Z0000 Encounter for general adult medical examination without abnormal findings: Secondary | ICD-10-CM | POA: Diagnosis not present

## 2019-08-13 DIAGNOSIS — E782 Mixed hyperlipidemia: Secondary | ICD-10-CM | POA: Diagnosis not present

## 2019-08-13 DIAGNOSIS — R7303 Prediabetes: Secondary | ICD-10-CM | POA: Diagnosis not present

## 2019-08-13 DIAGNOSIS — M810 Age-related osteoporosis without current pathological fracture: Secondary | ICD-10-CM | POA: Diagnosis not present

## 2019-08-13 DIAGNOSIS — J449 Chronic obstructive pulmonary disease, unspecified: Secondary | ICD-10-CM | POA: Diagnosis not present

## 2019-08-13 DIAGNOSIS — I10 Essential (primary) hypertension: Secondary | ICD-10-CM | POA: Diagnosis not present

## 2019-08-13 DIAGNOSIS — K219 Gastro-esophageal reflux disease without esophagitis: Secondary | ICD-10-CM | POA: Diagnosis not present

## 2019-08-13 DIAGNOSIS — Z79899 Other long term (current) drug therapy: Secondary | ICD-10-CM | POA: Diagnosis not present

## 2019-08-13 DIAGNOSIS — E875 Hyperkalemia: Secondary | ICD-10-CM | POA: Diagnosis not present

## 2019-09-20 DIAGNOSIS — Z03818 Encounter for observation for suspected exposure to other biological agents ruled out: Secondary | ICD-10-CM | POA: Diagnosis not present

## 2019-09-23 DIAGNOSIS — Z03818 Encounter for observation for suspected exposure to other biological agents ruled out: Secondary | ICD-10-CM | POA: Diagnosis not present

## 2019-10-11 DIAGNOSIS — Z03818 Encounter for observation for suspected exposure to other biological agents ruled out: Secondary | ICD-10-CM | POA: Diagnosis not present

## 2019-10-18 DIAGNOSIS — Z03818 Encounter for observation for suspected exposure to other biological agents ruled out: Secondary | ICD-10-CM | POA: Diagnosis not present

## 2019-10-25 DIAGNOSIS — Z03818 Encounter for observation for suspected exposure to other biological agents ruled out: Secondary | ICD-10-CM | POA: Diagnosis not present

## 2019-10-27 DIAGNOSIS — Z03818 Encounter for observation for suspected exposure to other biological agents ruled out: Secondary | ICD-10-CM | POA: Diagnosis not present

## 2019-11-01 DIAGNOSIS — Z03818 Encounter for observation for suspected exposure to other biological agents ruled out: Secondary | ICD-10-CM | POA: Diagnosis not present

## 2019-11-02 DIAGNOSIS — Z03818 Encounter for observation for suspected exposure to other biological agents ruled out: Secondary | ICD-10-CM | POA: Diagnosis not present

## 2019-11-09 DIAGNOSIS — Z03818 Encounter for observation for suspected exposure to other biological agents ruled out: Secondary | ICD-10-CM | POA: Diagnosis not present

## 2019-11-22 DIAGNOSIS — Z03818 Encounter for observation for suspected exposure to other biological agents ruled out: Secondary | ICD-10-CM | POA: Diagnosis not present

## 2019-11-30 DIAGNOSIS — E782 Mixed hyperlipidemia: Secondary | ICD-10-CM | POA: Diagnosis not present

## 2019-11-30 DIAGNOSIS — J449 Chronic obstructive pulmonary disease, unspecified: Secondary | ICD-10-CM | POA: Diagnosis not present

## 2019-11-30 DIAGNOSIS — I10 Essential (primary) hypertension: Secondary | ICD-10-CM | POA: Diagnosis not present

## 2019-11-30 DIAGNOSIS — M810 Age-related osteoporosis without current pathological fracture: Secondary | ICD-10-CM | POA: Diagnosis not present

## 2019-11-30 DIAGNOSIS — E039 Hypothyroidism, unspecified: Secondary | ICD-10-CM | POA: Diagnosis not present

## 2019-11-30 DIAGNOSIS — M1812 Unilateral primary osteoarthritis of first carpometacarpal joint, left hand: Secondary | ICD-10-CM | POA: Diagnosis not present

## 2019-12-21 DIAGNOSIS — Z03818 Encounter for observation for suspected exposure to other biological agents ruled out: Secondary | ICD-10-CM | POA: Diagnosis not present

## 2019-12-25 DIAGNOSIS — Z03818 Encounter for observation for suspected exposure to other biological agents ruled out: Secondary | ICD-10-CM | POA: Diagnosis not present

## 2019-12-29 DIAGNOSIS — Z03818 Encounter for observation for suspected exposure to other biological agents ruled out: Secondary | ICD-10-CM | POA: Diagnosis not present

## 2020-01-10 DIAGNOSIS — Z03818 Encounter for observation for suspected exposure to other biological agents ruled out: Secondary | ICD-10-CM | POA: Diagnosis not present

## 2020-01-17 DIAGNOSIS — J449 Chronic obstructive pulmonary disease, unspecified: Secondary | ICD-10-CM | POA: Diagnosis not present

## 2020-01-17 DIAGNOSIS — E782 Mixed hyperlipidemia: Secondary | ICD-10-CM | POA: Diagnosis not present

## 2020-01-17 DIAGNOSIS — M810 Age-related osteoporosis without current pathological fracture: Secondary | ICD-10-CM | POA: Diagnosis not present

## 2020-01-17 DIAGNOSIS — M1812 Unilateral primary osteoarthritis of first carpometacarpal joint, left hand: Secondary | ICD-10-CM | POA: Diagnosis not present

## 2020-01-17 DIAGNOSIS — E039 Hypothyroidism, unspecified: Secondary | ICD-10-CM | POA: Diagnosis not present

## 2020-01-17 DIAGNOSIS — Z03818 Encounter for observation for suspected exposure to other biological agents ruled out: Secondary | ICD-10-CM | POA: Diagnosis not present

## 2020-01-17 DIAGNOSIS — I10 Essential (primary) hypertension: Secondary | ICD-10-CM | POA: Diagnosis not present

## 2020-01-31 DIAGNOSIS — E782 Mixed hyperlipidemia: Secondary | ICD-10-CM | POA: Diagnosis not present

## 2020-01-31 DIAGNOSIS — Z79899 Other long term (current) drug therapy: Secondary | ICD-10-CM | POA: Diagnosis not present

## 2020-01-31 DIAGNOSIS — E559 Vitamin D deficiency, unspecified: Secondary | ICD-10-CM | POA: Diagnosis not present

## 2020-01-31 DIAGNOSIS — M810 Age-related osteoporosis without current pathological fracture: Secondary | ICD-10-CM | POA: Diagnosis not present

## 2020-01-31 DIAGNOSIS — J449 Chronic obstructive pulmonary disease, unspecified: Secondary | ICD-10-CM | POA: Diagnosis not present

## 2020-01-31 DIAGNOSIS — I10 Essential (primary) hypertension: Secondary | ICD-10-CM | POA: Diagnosis not present

## 2020-01-31 DIAGNOSIS — E039 Hypothyroidism, unspecified: Secondary | ICD-10-CM | POA: Diagnosis not present

## 2020-01-31 DIAGNOSIS — R7309 Other abnormal glucose: Secondary | ICD-10-CM | POA: Diagnosis not present

## 2020-01-31 DIAGNOSIS — Z0000 Encounter for general adult medical examination without abnormal findings: Secondary | ICD-10-CM | POA: Diagnosis not present

## 2020-01-31 DIAGNOSIS — K219 Gastro-esophageal reflux disease without esophagitis: Secondary | ICD-10-CM | POA: Diagnosis not present

## 2020-02-03 DIAGNOSIS — K219 Gastro-esophageal reflux disease without esophagitis: Secondary | ICD-10-CM | POA: Diagnosis not present

## 2020-02-03 DIAGNOSIS — J449 Chronic obstructive pulmonary disease, unspecified: Secondary | ICD-10-CM | POA: Diagnosis not present

## 2020-02-03 DIAGNOSIS — E559 Vitamin D deficiency, unspecified: Secondary | ICD-10-CM | POA: Diagnosis not present

## 2020-02-03 DIAGNOSIS — N3941 Urge incontinence: Secondary | ICD-10-CM | POA: Diagnosis not present

## 2020-02-03 DIAGNOSIS — Z23 Encounter for immunization: Secondary | ICD-10-CM | POA: Diagnosis not present

## 2020-02-03 DIAGNOSIS — R69 Illness, unspecified: Secondary | ICD-10-CM | POA: Diagnosis not present

## 2020-02-03 DIAGNOSIS — E039 Hypothyroidism, unspecified: Secondary | ICD-10-CM | POA: Diagnosis not present

## 2020-02-03 DIAGNOSIS — I10 Essential (primary) hypertension: Secondary | ICD-10-CM | POA: Diagnosis not present

## 2020-02-03 DIAGNOSIS — M810 Age-related osteoporosis without current pathological fracture: Secondary | ICD-10-CM | POA: Diagnosis not present

## 2020-02-03 DIAGNOSIS — E782 Mixed hyperlipidemia: Secondary | ICD-10-CM | POA: Diagnosis not present

## 2020-02-09 DIAGNOSIS — R69 Illness, unspecified: Secondary | ICD-10-CM | POA: Diagnosis not present

## 2020-02-13 ENCOUNTER — Inpatient Hospital Stay (HOSPITAL_COMMUNITY)
Admission: EM | Admit: 2020-02-13 | Discharge: 2020-03-10 | DRG: 981 | Disposition: A | Discharge status: Rehabilitation Facility | Payer: Medicare HMO | Attending: Neurology | Admitting: Neurology

## 2020-02-13 ENCOUNTER — Other Ambulatory Visit: Payer: Self-pay

## 2020-02-13 DIAGNOSIS — G9349 Other encephalopathy: Secondary | ICD-10-CM | POA: Diagnosis not present

## 2020-02-13 DIAGNOSIS — Z452 Encounter for adjustment and management of vascular access device: Secondary | ICD-10-CM

## 2020-02-13 DIAGNOSIS — R4701 Aphasia: Secondary | ICD-10-CM | POA: Diagnosis present

## 2020-02-13 DIAGNOSIS — Z7982 Long term (current) use of aspirin: Secondary | ICD-10-CM

## 2020-02-13 DIAGNOSIS — I2510 Atherosclerotic heart disease of native coronary artery without angina pectoris: Secondary | ICD-10-CM | POA: Diagnosis present

## 2020-02-13 DIAGNOSIS — D75839 Thrombocytosis, unspecified: Secondary | ICD-10-CM

## 2020-02-13 DIAGNOSIS — I358 Other nonrheumatic aortic valve disorders: Secondary | ICD-10-CM | POA: Diagnosis present

## 2020-02-13 DIAGNOSIS — G08 Intracranial and intraspinal phlebitis and thrombophlebitis: Secondary | ICD-10-CM | POA: Diagnosis present

## 2020-02-13 DIAGNOSIS — Z79899 Other long term (current) drug therapy: Secondary | ICD-10-CM

## 2020-02-13 DIAGNOSIS — T462X5A Adverse effect of other antidysrhythmic drugs, initial encounter: Secondary | ICD-10-CM | POA: Diagnosis present

## 2020-02-13 DIAGNOSIS — D751 Secondary polycythemia: Secondary | ICD-10-CM

## 2020-02-13 DIAGNOSIS — G441 Vascular headache, not elsewhere classified: Secondary | ICD-10-CM

## 2020-02-13 DIAGNOSIS — K5730 Diverticulosis of large intestine without perforation or abscess without bleeding: Secondary | ICD-10-CM | POA: Diagnosis present

## 2020-02-13 DIAGNOSIS — E876 Hypokalemia: Secondary | ICD-10-CM

## 2020-02-13 DIAGNOSIS — D473 Essential (hemorrhagic) thrombocythemia: Secondary | ICD-10-CM

## 2020-02-13 DIAGNOSIS — I472 Ventricular tachycardia: Secondary | ICD-10-CM | POA: Diagnosis not present

## 2020-02-13 DIAGNOSIS — I2699 Other pulmonary embolism without acute cor pulmonale: Secondary | ICD-10-CM

## 2020-02-13 DIAGNOSIS — I610 Nontraumatic intracerebral hemorrhage in hemisphere, subcortical: Secondary | ICD-10-CM

## 2020-02-13 DIAGNOSIS — R29700 NIHSS score 0: Secondary | ICD-10-CM | POA: Diagnosis present

## 2020-02-13 DIAGNOSIS — D649 Anemia, unspecified: Secondary | ICD-10-CM | POA: Diagnosis present

## 2020-02-13 DIAGNOSIS — Z823 Family history of stroke: Secondary | ICD-10-CM

## 2020-02-13 DIAGNOSIS — E7800 Pure hypercholesterolemia, unspecified: Secondary | ICD-10-CM

## 2020-02-13 DIAGNOSIS — E785 Hyperlipidemia, unspecified: Secondary | ICD-10-CM | POA: Diagnosis present

## 2020-02-13 DIAGNOSIS — Z79890 Hormone replacement therapy (postmenopausal): Secondary | ICD-10-CM

## 2020-02-13 DIAGNOSIS — I8290 Acute embolism and thrombosis of unspecified vein: Secondary | ICD-10-CM

## 2020-02-13 DIAGNOSIS — R0602 Shortness of breath: Secondary | ICD-10-CM

## 2020-02-13 DIAGNOSIS — I615 Nontraumatic intracerebral hemorrhage, intraventricular: Secondary | ICD-10-CM | POA: Diagnosis not present

## 2020-02-13 DIAGNOSIS — D45 Polycythemia vera: Secondary | ICD-10-CM

## 2020-02-13 DIAGNOSIS — Z781 Physical restraint status: Secondary | ICD-10-CM

## 2020-02-13 DIAGNOSIS — I10 Essential (primary) hypertension: Secondary | ICD-10-CM

## 2020-02-13 DIAGNOSIS — J9811 Atelectasis: Secondary | ICD-10-CM | POA: Diagnosis not present

## 2020-02-13 DIAGNOSIS — R1310 Dysphagia, unspecified: Secondary | ICD-10-CM

## 2020-02-13 DIAGNOSIS — I7389 Other specified peripheral vascular diseases: Secondary | ICD-10-CM | POA: Diagnosis present

## 2020-02-13 DIAGNOSIS — D7582 Heparin induced thrombocytopenia (HIT): Secondary | ICD-10-CM | POA: Diagnosis not present

## 2020-02-13 DIAGNOSIS — B1920 Unspecified viral hepatitis C without hepatic coma: Secondary | ICD-10-CM | POA: Diagnosis present

## 2020-02-13 DIAGNOSIS — Z20822 Contact with and (suspected) exposure to covid-19: Secondary | ICD-10-CM | POA: Diagnosis present

## 2020-02-13 DIAGNOSIS — Z87891 Personal history of nicotine dependence: Secondary | ICD-10-CM

## 2020-02-13 DIAGNOSIS — R519 Headache, unspecified: Secondary | ICD-10-CM | POA: Diagnosis present

## 2020-02-13 DIAGNOSIS — I6340 Cerebral infarction due to embolism of unspecified cerebral artery: Secondary | ICD-10-CM | POA: Diagnosis present

## 2020-02-13 DIAGNOSIS — R7303 Prediabetes: Secondary | ICD-10-CM | POA: Diagnosis present

## 2020-02-13 DIAGNOSIS — I680 Cerebral amyloid angiopathy: Secondary | ICD-10-CM | POA: Diagnosis present

## 2020-02-13 DIAGNOSIS — Z8673 Personal history of transient ischemic attack (TIA), and cerebral infarction without residual deficits: Secondary | ICD-10-CM

## 2020-02-13 DIAGNOSIS — I4892 Unspecified atrial flutter: Secondary | ICD-10-CM

## 2020-02-13 DIAGNOSIS — R9389 Abnormal findings on diagnostic imaging of other specified body structures: Secondary | ICD-10-CM

## 2020-02-13 DIAGNOSIS — Z8249 Family history of ischemic heart disease and other diseases of the circulatory system: Secondary | ICD-10-CM

## 2020-02-13 DIAGNOSIS — Z6828 Body mass index (BMI) 28.0-28.9, adult: Secondary | ICD-10-CM

## 2020-02-13 DIAGNOSIS — C9440 Acute panmyelosis with myelofibrosis not having achieved remission: Secondary | ICD-10-CM | POA: Diagnosis present

## 2020-02-13 DIAGNOSIS — G459 Transient cerebral ischemic attack, unspecified: Secondary | ICD-10-CM

## 2020-02-13 DIAGNOSIS — E663 Overweight: Secondary | ICD-10-CM | POA: Diagnosis present

## 2020-02-13 DIAGNOSIS — R1312 Dysphagia, oropharyngeal phase: Secondary | ICD-10-CM | POA: Diagnosis present

## 2020-02-13 DIAGNOSIS — F05 Delirium due to known physiological condition: Secondary | ICD-10-CM | POA: Diagnosis not present

## 2020-02-13 DIAGNOSIS — T380X5A Adverse effect of glucocorticoids and synthetic analogues, initial encounter: Secondary | ICD-10-CM | POA: Diagnosis present

## 2020-02-13 DIAGNOSIS — G936 Cerebral edema: Secondary | ICD-10-CM | POA: Diagnosis present

## 2020-02-13 DIAGNOSIS — E854 Organ-limited amyloidosis: Secondary | ICD-10-CM

## 2020-02-13 DIAGNOSIS — E039 Hypothyroidism, unspecified: Secondary | ICD-10-CM | POA: Diagnosis present

## 2020-02-13 DIAGNOSIS — I480 Paroxysmal atrial fibrillation: Secondary | ICD-10-CM | POA: Diagnosis not present

## 2020-02-13 LAB — BASIC METABOLIC PANEL
Anion gap: 11 (ref 5–15)
BUN: 13 mg/dL (ref 8–23)
CO2: 22 mmol/L (ref 22–32)
Calcium: 9.5 mg/dL (ref 8.9–10.3)
Chloride: 105 mmol/L (ref 98–111)
Creatinine, Ser: 0.93 mg/dL (ref 0.44–1.00)
GFR calc Af Amer: >60 mL/min (ref >60)
GFR calc non Af Amer: >60 mL/min (ref >60)
Glucose, Bld: 133 mg/dL — ABNORMAL HIGH (ref 70–99)
Potassium: 3.8 mmol/L (ref 3.5–5.1)
Sodium: 138 mmol/L (ref 135–145)

## 2020-02-13 MED ORDER — PROCHLORPERAZINE EDISYLATE 10 MG/2ML IJ SOLN: 10.0000 mg | Freq: Once | INTRAMUSCULAR | Status: DC

## 2020-02-13 MED ORDER — SODIUM CHLORIDE 0.9 % IV BOLUS
1000.0000 mL | Freq: Once | INTRAVENOUS | Status: AC
  Administered 2020-02-19: 12:00:00 1000 mL via INTRAVENOUS

## 2020-02-13 MED ORDER — DIPHENHYDRAMINE HCL 50 MG/ML IJ SOLN: 25.0000 mg | Freq: Once | INTRAMUSCULAR | Status: DC

## 2020-02-13 NOTE — ED Provider Notes (Signed)
[facility] EMERGENCY DEPARTMENT Provider Note   CSN: [id number] Arrival date & time: 02/13/20  [id number]   History Chief complaint: Headache, hypertension  [name] is a 74 y.o. female.  The history is provided by the patient.  She has a history of hypertension, hyperlipidemia, transient ischemic attack and comes in complaining of headache which started this morning.  Headache is bilateral across the lower occiput with radiation to the frontal area.  Pain was severe earlier today, but has improved slightly and she is now rating pain at 6/10.  She denies any visual change.  There has been no photophobia or phonophobia.  She denies any weakness or numbness.  Nothing made the pain worse, but it seemed to be slightly better if she stood up.  She took ibuprofen x2 without any relief.  She has associated nausea and vomiting, but does not feel any better after emesis.  Blood pressure at home has been elevated to as high as 160/113.  Past Medical History:  Diagnosis Date   Hepatitis C    Hyperlipidemia    Hypertension    Osteoporosis     Patient Active Problem List   Diagnosis Date Noted   TIA (transient ischemic attack) 08/17/2014   Acute sinusitis 08/16/2014   Headache 08/15/2014   Hypothyroidism 08/15/2014   Hepatitis C    Hyperlipidemia    Hypertension     No past surgical history on file.   OB History   No obstetric history on file.     No family history on file.  Social History   Tobacco Use   Smoking status: Former Smoker  Substance Use Topics   Alcohol use: No   Drug use: No    Home Medications Prior to Admission medications   Medication Sig Start Date End Date Taking? Authorizing Provider  ALPRAZolam Duanne Moron) 0.5 MG tablet Take 0.25 mg by mouth daily as needed for anxiety.     [provider]  aspirin 81 MG tablet Take 1 tablet (81 mg total) by mouth daily. 08/17/14   Rai, Vernelle Emerald, MD  atorvastatin (LIPITOR) 10 MG  tablet Take 10 mg by mouth every evening.     [provider]  Cholecalciferol (VITAMIN D3) 2000 UNITS TABS Take 2,000 Units by mouth every evening.    [provider]  clobetasol cream (TEMOVATE) [id number] % Apply 1 application topically daily as needed (itching).  08/11/14   [provider]  HYDROCORTISONE EX Apply 1 application topically 3 (three) times daily as needed (itching).    [provider]  ibandronate (BONIVA) 150 MG tablet Take 150 mg by mouth every 30 (thirty) days. Take in the morning with a full glass of water, on an empty stomach, and do not take anything else by mouth or lie down for the next 30 min.    [provider]  levothyroxine (SYNTHROID, LEVOTHROID) 125 MCG tablet Take 125 mcg by mouth daily before breakfast.    [provider]  losartan (COZAAR) 100 MG tablet Take 100 mg by mouth every evening.    [provider]  Probiotic Product (ALIGN PO) Take 1 capsule by mouth daily.    [provider]  solifenacin (VESICARE) 10 MG tablet Take 10 mg by mouth every evening.     [provider]    Allergies    Erythromycin  Review of Systems   Review of Systems  All other systems reviewed and are negative.   Physical Exam Updated Vital  Signs BP (!) 153/66 (BP Location: Left Arm)    Pulse 80    Temp 97.9 F (36.6 C) (Oral)    Resp 16    Ht 5\' 2"  (1.575 m)    Wt 70.3 kg    SpO2 96%    BMI 28.35 kg/m   Physical Exam Vitals and nursing note reviewed.   74 year old female, resting comfortably and in no acute distress. Vital signs are significant for elevated blood pressure. Oxygen saturation is 96%, which is normal. Head is normocephalic and atraumatic. PERRLA, EOMI. Oropharynx is clear. Neck is nontender and supple without adenopathy or JVD. Back is nontender and there is no CVA tenderness. Lungs are clear without rales, wheezes, or rhonchi. Chest is nontender. Heart has regular rate and rhythm  without murmur. Abdomen is soft, flat, nontender without masses or hepatosplenomegaly and peristalsis is normoactive. Extremities have no cyanosis or edema, full range of motion is present. Skin is warm and dry without rash. Neurologic: Mental status is normal, cranial nerves are intact, there are no motor or sensory deficits.  Grip strength is equal, strength is 5/5 in all 4 extremities.  Gait is normal.  There is no pronator drift.  ED Results / Procedures / Treatments   Labs (all labs ordered are listed, but only abnormal results are displayed) Labs Reviewed  BASIC METABOLIC PANEL - Abnormal; Notable for the following components:      Result Value   Glucose, Bld 133 (*)    All other components within normal limits  CBC - Abnormal; Notable for the following components:   WBC 12.0 (*)    RBC 6.02 (*)    Hemoglobin 17.7 (*)    HCT 55.5 (*)    Platelets 990 (*)    All other components within normal limits    EKG None  Radiology CT Head Wo Contrast  Result Date: 02/14/2020 CLINICAL DATA:  Acute onset headache EXAM: CT HEAD WITHOUT CONTRAST TECHNIQUE: Contiguous axial images were obtained from the base of the skull through the vertex without intravenous contrast. COMPARISON:  None. FINDINGS: Brain: There is acute hemorrhage within the right lateral ventricle. No definite intraparenchymal component is identified. There is severe chronic white matter disease, most commonly indicating chronic ischemic microangiopathy. Volume of CSF spaces is normal. No midline shift or other mass effect Vascular: No abnormal hyperdensity of the major intracranial arteries or dural venous sinuses. No intracranial atherosclerosis. Skull: The visualized skull base, calvarium and extracranial soft tissues are normal. Sinuses/Orbits: No fluid levels or advanced mucosal thickening of the visualized paranasal sinuses. No mastoid or middle ear effusion. The orbits are normal. IMPRESSION: 1. Acute intraventricular  hemorrhage within the right lateral ventricle. No definite parenchymal component identified. This is an unusual pattern, but is perhaps less unlikely in the setting of this patient's abnormal coagulation status. 2. Severe chronic ischemic microangiopathy. Critical Value/emergent results were called by telephone at the time of interpretation on 02/14/2020 at 12:31 am to provider Chancelor Hardrick [facility] , who verbally acknowledged these results. Electronically Signed   By: Ulyses Jarred M.D.   On: 02/14/2020 00:32    Procedures Procedures  CRITICAL CARE Performed by: Delora Fuel Total critical care time: 45 minutes Critical care time was exclusive of separately billable procedures and treating other patients. Critical care was necessary to treat or prevent imminent or life-threatening deterioration. Critical care was time spent personally by me on the following activities: development of treatment plan with patient and/or surrogate as well as nursing, discussions  with consultants, evaluation of patient's response to treatment, examination of patient, obtaining history from patient or surrogate, ordering and performing treatments and interventions, ordering and review of laboratory studies, ordering and review of radiographic studies, pulse oximetry and re-evaluation of patient's condition.  Medications Ordered in ED Medications  sodium chloride 0.9 % bolus 1,000 mL (1,000 mLs Intravenous Not Given 02/14/20 0041)  clevidipine (CLEVIPREX) infusion 0.5 mg/mL (has no administration in time range)    ED Course  I have reviewed the triage vital signs and the nursing notes.  Pertinent labs & imaging results that were available during my care of the patient were reviewed by me and considered in my medical decision making (see chart for details).  Headache with elevated blood pressure.  No red flags to suggest serious causes of headache.  I suspect blood pressure is elevated in response to pain.  Labs were drawn showing  markedly elevated platelet count and elevated hemoglobin as well as mildly elevated WBC.  Last CBC on record was in 2015 and was normal.  Patient's primary care provider had not mentioned any abnormal test results in the past even though she did have blood work done last month.  Results are concerning for possible polycythemia vera.  Platelet counts this high can sometimes be associated with abnormal bleeding, so will send for CT of head.  She will also be given a headache cocktail of normal saline, prochlorperazine, diphenhydramine.  Old records are reviewed, and she was admitted for transient ischemic attack in 2015 which was when her previous lab work had been done.    CT scan shows intracerebral hemorrhage extending into the right lateral ventricle.  There is no midline shift or mass-effect.  This is felt to be secondary to her thrombocytosis.  Case has been discussed with Dr. Leonel Ramsay of neurology service agrees to admit the patient.  Also discussed with Dr. Jana Hakim of oncology service who agrees to see the patient in consultation and will try to arrange for plateletpheresis.  MDM Rules/Calculators/A&P   Final Clinical Impression(s) / ED Diagnoses Final diagnoses:  Nontraumatic subcortical hemorrhage of right cerebral hemisphere (HCC)  Thrombocytosis ([place])  Polycythemia  Elevated blood pressure reading with diagnosis of hypertension   Rx / DC Orders ED Discharge Orders    None       Delora Fuel, MD [id number] [phone]

## 2020-02-13 NOTE — ED Triage Notes (Signed)
Onset 11:30am pt started getting headache, BP was elevated.  Pt did take BP medications today and yesterday.  Does tend to forget to take some days.

## 2020-02-14 ENCOUNTER — Inpatient Hospital Stay (HOSPITAL_COMMUNITY): Payer: Medicare HMO

## 2020-02-14 ENCOUNTER — Inpatient Hospital Stay: Payer: Self-pay

## 2020-02-14 ENCOUNTER — Emergency Department (HOSPITAL_COMMUNITY): Payer: Medicare HMO

## 2020-02-14 DIAGNOSIS — T380X5A Adverse effect of glucocorticoids and synthetic analogues, initial encounter: Secondary | ICD-10-CM | POA: Diagnosis not present

## 2020-02-14 DIAGNOSIS — I6389 Other cerebral infarction: Secondary | ICD-10-CM | POA: Diagnosis not present

## 2020-02-14 DIAGNOSIS — I8290 Acute embolism and thrombosis of unspecified vein: Secondary | ICD-10-CM | POA: Diagnosis not present

## 2020-02-14 DIAGNOSIS — R9389 Abnormal findings on diagnostic imaging of other specified body structures: Secondary | ICD-10-CM | POA: Diagnosis not present

## 2020-02-14 DIAGNOSIS — I2694 Multiple subsegmental pulmonary emboli without acute cor pulmonale: Secondary | ICD-10-CM | POA: Diagnosis not present

## 2020-02-14 DIAGNOSIS — G08 Intracranial and intraspinal phlebitis and thrombophlebitis: Secondary | ICD-10-CM | POA: Diagnosis not present

## 2020-02-14 DIAGNOSIS — K5730 Diverticulosis of large intestine without perforation or abscess without bleeding: Secondary | ICD-10-CM | POA: Diagnosis not present

## 2020-02-14 DIAGNOSIS — E854 Organ-limited amyloidosis: Secondary | ICD-10-CM | POA: Diagnosis not present

## 2020-02-14 DIAGNOSIS — G9349 Other encephalopathy: Secondary | ICD-10-CM | POA: Diagnosis not present

## 2020-02-14 DIAGNOSIS — E039 Hypothyroidism, unspecified: Secondary | ICD-10-CM | POA: Diagnosis not present

## 2020-02-14 DIAGNOSIS — R799 Abnormal finding of blood chemistry, unspecified: Secondary | ICD-10-CM | POA: Diagnosis not present

## 2020-02-14 DIAGNOSIS — I6340 Cerebral infarction due to embolism of unspecified cerebral artery: Secondary | ICD-10-CM | POA: Diagnosis not present

## 2020-02-14 DIAGNOSIS — F05 Delirium due to known physiological condition: Secondary | ICD-10-CM | POA: Diagnosis not present

## 2020-02-14 DIAGNOSIS — R739 Hyperglycemia, unspecified: Secondary | ICD-10-CM | POA: Diagnosis not present

## 2020-02-14 DIAGNOSIS — Z79899 Other long term (current) drug therapy: Secondary | ICD-10-CM | POA: Diagnosis not present

## 2020-02-14 DIAGNOSIS — D45 Polycythemia vera: Secondary | ICD-10-CM | POA: Diagnosis not present

## 2020-02-14 DIAGNOSIS — D473 Essential (hemorrhagic) thrombocythemia: Secondary | ICD-10-CM | POA: Diagnosis not present

## 2020-02-14 DIAGNOSIS — Z20822 Contact with and (suspected) exposure to covid-19: Secondary | ICD-10-CM | POA: Diagnosis not present

## 2020-02-14 DIAGNOSIS — R4587 Impulsiveness: Secondary | ICD-10-CM | POA: Diagnosis not present

## 2020-02-14 DIAGNOSIS — D72829 Elevated white blood cell count, unspecified: Secondary | ICD-10-CM | POA: Diagnosis not present

## 2020-02-14 DIAGNOSIS — R519 Headache, unspecified: Secondary | ICD-10-CM | POA: Diagnosis not present

## 2020-02-14 DIAGNOSIS — I639 Cerebral infarction, unspecified: Secondary | ICD-10-CM | POA: Diagnosis not present

## 2020-02-14 DIAGNOSIS — D751 Secondary polycythemia: Secondary | ICD-10-CM | POA: Diagnosis not present

## 2020-02-14 DIAGNOSIS — I2690 Septic pulmonary embolism without acute cor pulmonale: Secondary | ICD-10-CM | POA: Diagnosis not present

## 2020-02-14 DIAGNOSIS — D72828 Other elevated white blood cell count: Secondary | ICD-10-CM | POA: Diagnosis not present

## 2020-02-14 DIAGNOSIS — I472 Ventricular tachycardia: Secondary | ICD-10-CM | POA: Diagnosis not present

## 2020-02-14 DIAGNOSIS — J9811 Atelectasis: Secondary | ICD-10-CM | POA: Diagnosis not present

## 2020-02-14 DIAGNOSIS — J90 Pleural effusion, not elsewhere classified: Secondary | ICD-10-CM | POA: Diagnosis not present

## 2020-02-14 DIAGNOSIS — B182 Chronic viral hepatitis C: Secondary | ICD-10-CM | POA: Diagnosis not present

## 2020-02-14 DIAGNOSIS — R609 Edema, unspecified: Secondary | ICD-10-CM | POA: Diagnosis not present

## 2020-02-14 DIAGNOSIS — D649 Anemia, unspecified: Secondary | ICD-10-CM | POA: Diagnosis present

## 2020-02-14 DIAGNOSIS — Z9889 Other specified postprocedural states: Secondary | ICD-10-CM | POA: Diagnosis not present

## 2020-02-14 DIAGNOSIS — I611 Nontraumatic intracerebral hemorrhage in hemisphere, cortical: Secondary | ICD-10-CM | POA: Diagnosis not present

## 2020-02-14 DIAGNOSIS — G936 Cerebral edema: Secondary | ICD-10-CM | POA: Diagnosis not present

## 2020-02-14 DIAGNOSIS — R1312 Dysphagia, oropharyngeal phase: Secondary | ICD-10-CM | POA: Diagnosis not present

## 2020-02-14 DIAGNOSIS — I615 Nontraumatic intracerebral hemorrhage, intraventricular: Principal | ICD-10-CM

## 2020-02-14 DIAGNOSIS — I2699 Other pulmonary embolism without acute cor pulmonale: Secondary | ICD-10-CM | POA: Diagnosis not present

## 2020-02-14 DIAGNOSIS — Z95828 Presence of other vascular implants and grafts: Secondary | ICD-10-CM | POA: Diagnosis not present

## 2020-02-14 DIAGNOSIS — B1920 Unspecified viral hepatitis C without hepatic coma: Secondary | ICD-10-CM | POA: Diagnosis present

## 2020-02-14 DIAGNOSIS — E785 Hyperlipidemia, unspecified: Secondary | ICD-10-CM | POA: Diagnosis present

## 2020-02-14 DIAGNOSIS — R7303 Prediabetes: Secondary | ICD-10-CM | POA: Diagnosis not present

## 2020-02-14 DIAGNOSIS — C9440 Acute panmyelosis with myelofibrosis not having achieved remission: Secondary | ICD-10-CM | POA: Diagnosis not present

## 2020-02-14 DIAGNOSIS — G441 Vascular headache, not elsewhere classified: Secondary | ICD-10-CM | POA: Diagnosis not present

## 2020-02-14 DIAGNOSIS — I679 Cerebrovascular disease, unspecified: Secondary | ICD-10-CM | POA: Diagnosis not present

## 2020-02-14 DIAGNOSIS — I610 Nontraumatic intracerebral hemorrhage in hemisphere, subcortical: Secondary | ICD-10-CM | POA: Diagnosis not present

## 2020-02-14 DIAGNOSIS — I676 Nonpyogenic thrombosis of intracranial venous system: Secondary | ICD-10-CM | POA: Diagnosis not present

## 2020-02-14 DIAGNOSIS — I10 Essential (primary) hypertension: Secondary | ICD-10-CM | POA: Diagnosis not present

## 2020-02-14 DIAGNOSIS — E8809 Other disorders of plasma-protein metabolism, not elsewhere classified: Secondary | ICD-10-CM | POA: Diagnosis not present

## 2020-02-14 DIAGNOSIS — I680 Cerebral amyloid angiopathy: Secondary | ICD-10-CM | POA: Diagnosis not present

## 2020-02-14 DIAGNOSIS — E46 Unspecified protein-calorie malnutrition: Secondary | ICD-10-CM | POA: Diagnosis not present

## 2020-02-14 DIAGNOSIS — I629 Nontraumatic intracranial hemorrhage, unspecified: Secondary | ICD-10-CM | POA: Diagnosis not present

## 2020-02-14 DIAGNOSIS — I69391 Dysphagia following cerebral infarction: Secondary | ICD-10-CM | POA: Diagnosis not present

## 2020-02-14 DIAGNOSIS — R918 Other nonspecific abnormal finding of lung field: Secondary | ICD-10-CM | POA: Diagnosis not present

## 2020-02-14 DIAGNOSIS — R1311 Dysphagia, oral phase: Secondary | ICD-10-CM | POA: Diagnosis not present

## 2020-02-14 DIAGNOSIS — R4701 Aphasia: Secondary | ICD-10-CM | POA: Diagnosis present

## 2020-02-14 DIAGNOSIS — I4892 Unspecified atrial flutter: Secondary | ICD-10-CM | POA: Diagnosis not present

## 2020-02-14 DIAGNOSIS — Z452 Encounter for adjustment and management of vascular access device: Secondary | ICD-10-CM | POA: Diagnosis not present

## 2020-02-14 DIAGNOSIS — D7582 Heparin induced thrombocytopenia (HIT): Secondary | ICD-10-CM | POA: Diagnosis not present

## 2020-02-14 DIAGNOSIS — R1310 Dysphagia, unspecified: Secondary | ICD-10-CM | POA: Diagnosis not present

## 2020-02-14 DIAGNOSIS — E876 Hypokalemia: Secondary | ICD-10-CM | POA: Diagnosis not present

## 2020-02-14 DIAGNOSIS — I619 Nontraumatic intracerebral hemorrhage, unspecified: Secondary | ICD-10-CM | POA: Diagnosis not present

## 2020-02-14 DIAGNOSIS — R69 Illness, unspecified: Secondary | ICD-10-CM | POA: Diagnosis not present

## 2020-02-14 DIAGNOSIS — E7800 Pure hypercholesterolemia, unspecified: Secondary | ICD-10-CM | POA: Diagnosis not present

## 2020-02-14 LAB — CBC
HCT: 55.5 % — ABNORMAL HIGH (ref 36.0–46.0)
Hemoglobin: 17.7 g/dL — ABNORMAL HIGH (ref 12.0–15.0)
MCH: 29.4 pg (ref 26.0–34.0)
MCHC: 31.9 g/dL (ref 30.0–36.0)
MCV: 92.2 fL (ref 80.0–100.0)
Platelets: 990 10*3/uL (ref 150–400)
RBC: 6.02 MIL/uL — ABNORMAL HIGH (ref 3.87–5.11)
RDW: 14.3 % (ref 11.5–15.5)
WBC: 12 10*3/uL — ABNORMAL HIGH (ref 4.0–10.5)
nRBC: 0 % (ref 0.0–0.2)

## 2020-02-14 LAB — BASIC METABOLIC PANEL
Anion gap: 13 (ref 5–15)
BUN: 18 mg/dL (ref 8–23)
CO2: 23 mmol/L (ref 22–32)
Calcium: 9.7 mg/dL (ref 8.9–10.3)
Chloride: 105 mmol/L (ref 98–111)
Creatinine, Ser: 0.94 mg/dL (ref 0.44–1.00)
GFR calc Af Amer: >60 mL/min (ref >60)
GFR calc non Af Amer: >60 mL/min (ref >60)
Glucose, Bld: 129 mg/dL — ABNORMAL HIGH (ref 70–99)
Potassium: 3.7 mmol/L (ref 3.5–5.1)
Sodium: 141 mmol/L (ref 135–145)

## 2020-02-14 LAB — VITAMIN B12: Vitamin B-12: 248 pg/mL (ref 180–914)

## 2020-02-14 LAB — PROTIME-INR
INR: 1.1 (ref 0.8–1.2)
Prothrombin Time: 13.6 seconds (ref 11.4–15.2)

## 2020-02-14 LAB — IRON AND TIBC
Iron: 73 ug/dL (ref 28–170)
Saturation Ratios: 20 % (ref 10.4–31.8)
TIBC: 358 ug/dL (ref 250–450)
UIBC: 285 ug/dL

## 2020-02-14 LAB — TSH: TSH: 1.581 u[IU]/mL (ref 0.350–4.500)

## 2020-02-14 LAB — LIPID PANEL
Cholesterol: 172 mg/dL (ref 0–200)
HDL: 71 mg/dL (ref >40)
LDL Cholesterol: 92 mg/dL (ref 0–99)
Total CHOL/HDL Ratio: 2.4 RATIO
Triglycerides: 46 mg/dL (ref <150)
VLDL: 9 mg/dL (ref 0–40)

## 2020-02-14 LAB — RESPIRATORY PANEL BY RT PCR (FLU A&B, COVID)
Influenza A by PCR: NEGATIVE
Influenza B by PCR: NEGATIVE
SARS Coronavirus 2 by RT PCR: NEGATIVE

## 2020-02-14 LAB — MRSA PCR SCREENING: MRSA by PCR: NEGATIVE

## 2020-02-14 LAB — URIC ACID: Uric Acid, Serum: 5.4 mg/dL (ref 2.5–7.1)

## 2020-02-14 LAB — APTT: aPTT: 40 seconds — ABNORMAL HIGH (ref 24–36)

## 2020-02-14 LAB — ECHOCARDIOGRAM COMPLETE
Height: 62 in
Weight: 2480 oz

## 2020-02-14 LAB — HEMOGLOBIN A1C
Hgb A1c MFr Bld: 6.1 % — ABNORMAL HIGH (ref 4.8–5.6)
Mean Plasma Glucose: 128.37 mg/dL

## 2020-02-14 LAB — PATHOLOGIST SMEAR REVIEW: Path Review: REACTIVE

## 2020-02-14 LAB — SARS CORONAVIRUS 2 (TAT 6-24 HRS): SARS Coronavirus 2: NEGATIVE

## 2020-02-14 LAB — FERRITIN: Ferritin: 75 ng/mL (ref 11–307)

## 2020-02-14 MED ORDER — ACD FORMULA A 0.73-2.45-2.2 GM/100ML VI SOLN
Status: AC
  Filled 2020-02-14: qty 500

## 2020-02-14 MED ORDER — ACETAMINOPHEN 325 MG PO TABS
650.0000 mg | ORAL_TABLET | Freq: Four times a day (QID) | ORAL | Status: DC | PRN
  Administered 2020-02-14 – 2020-02-21 (×9): 650 mg via ORAL
  Administered 2020-02-24: 325 mg via ORAL
  Filled 2020-02-14 (×11): qty 2

## 2020-02-14 MED ORDER — FENTANYL CITRATE (PF) 100 MCG/2ML IJ SOLN
INTRAMUSCULAR | Status: AC
  Filled 2020-02-14: qty 2

## 2020-02-14 MED ORDER — PROMETHAZINE HCL 25 MG/ML IJ SOLN
12.5000 mg | Freq: Once | INTRAMUSCULAR | Status: AC
  Administered 2020-02-14: 14:00:00 12.5 mg via INTRAVENOUS
  Filled 2020-02-14: qty 1

## 2020-02-14 MED ORDER — ACETAMINOPHEN 160 MG/5ML PO SOLN: 650.0000 mg | ORAL | Status: DC | PRN

## 2020-02-14 MED ORDER — SODIUM CHLORIDE 0.9% FLUSH: 10.0000 mL | INTRAVENOUS | Status: DC | PRN

## 2020-02-14 MED ORDER — ACD FORMULA A 0.73-2.45-2.2 GM/100ML VI SOLN
1000.0000 mL | Status: DC
  Administered 2020-02-14: 17:00:00 1000 mL
  Filled 2020-02-14: qty 1000

## 2020-02-14 MED ORDER — CLEVIDIPINE BUTYRATE 0.5 MG/ML IV EMUL
0.0000 mg/h | INTRAVENOUS | Status: DC
  Administered 2020-02-14: 16:00:00 6.5 mg/h via INTRAVENOUS
  Administered 2020-02-14: 06:00:00 3 mg/h via INTRAVENOUS
  Administered 2020-02-14: 1 mg/h via INTRAVENOUS
  Administered 2020-02-14: 22:00:00 5 mg/h via INTRAVENOUS
  Administered 2020-02-15: 04:00:00 4 mg/h via INTRAVENOUS
  Filled 2020-02-14 (×7): qty 50

## 2020-02-14 MED ORDER — PANTOPRAZOLE SODIUM 40 MG IV SOLR
40.0000 mg | Freq: Every day | INTRAVENOUS | Status: DC
  Administered 2020-02-14 (×2): 40 mg via INTRAVENOUS
  Filled 2020-02-14 (×3): qty 40

## 2020-02-14 MED ORDER — DIPHENHYDRAMINE HCL 25 MG PO CAPS: 25.0000 mg | ORAL_CAPSULE | Freq: Four times a day (QID) | ORAL | Status: DC | PRN

## 2020-02-14 MED ORDER — ACETAMINOPHEN 160 MG/5ML PO SOLN
650.0000 mg | Freq: Four times a day (QID) | ORAL | Status: DC | PRN
  Administered 2020-02-25 – 2020-03-09 (×6): 650 mg
  Filled 2020-02-14 (×5): qty 20.3

## 2020-02-14 MED ORDER — ACETAMINOPHEN 650 MG RE SUPP: 650.0000 mg | RECTAL | Status: DC | PRN

## 2020-02-14 MED ORDER — SODIUM CHLORIDE 0.9% FLUSH
10.0000 mL | Freq: Two times a day (BID) | INTRAVENOUS | Status: DC
  Administered 2020-02-14 – 2020-02-21 (×12): 10 mL
  Administered 2020-02-21: 30 mL
  Administered 2020-02-22 – 2020-02-26 (×7): 10 mL
  Administered 2020-02-26: 20 mL
  Administered 2020-02-27 – 2020-02-28 (×3): 10 mL

## 2020-02-14 MED ORDER — ACETAMINOPHEN 325 MG PO TABS
650.0000 mg | ORAL_TABLET | ORAL | Status: DC | PRN
  Administered 2020-02-15: 18:00:00 650 mg via ORAL
  Filled 2020-02-14: qty 2

## 2020-02-14 MED ORDER — BUTALBITAL-APAP-CAFFEINE 50-325-40 MG PO TABS
1.0000 | ORAL_TABLET | Freq: Three times a day (TID) | ORAL | Status: DC | PRN
  Administered 2020-02-15 – 2020-02-28 (×19): 1 via ORAL
  Filled 2020-02-14 (×21): qty 1

## 2020-02-14 MED ORDER — CALCIUM CARBONATE ANTACID 500 MG PO CHEW
CHEWABLE_TABLET | ORAL | Status: AC
  Filled 2020-02-14: qty 2

## 2020-02-14 MED ORDER — FENTANYL CITRATE (PF) 100 MCG/2ML IJ SOLN: 25.0000 ug | Freq: Once | INTRAMUSCULAR | Status: DC

## 2020-02-14 MED ORDER — ACETAMINOPHEN 325 MG PO TABS
650.0000 mg | ORAL_TABLET | ORAL | Status: DC | PRN
  Administered 2020-02-14: 03:00:00 650 mg via ORAL
  Filled 2020-02-14: qty 2

## 2020-02-14 MED ORDER — STROKE: EARLY STAGES OF RECOVERY BOOK
Freq: Once | Status: DC
  Filled 2020-02-14: qty 1

## 2020-02-14 MED ORDER — HEPARIN SODIUM (PORCINE) 1000 UNIT/ML IJ SOLN
1000.0000 [IU] | Freq: Once | INTRAMUSCULAR | Status: DC
  Filled 2020-02-14: qty 1

## 2020-02-14 MED ORDER — SENNOSIDES-DOCUSATE SODIUM 8.6-50 MG PO TABS
1.0000 | ORAL_TABLET | Freq: Two times a day (BID) | ORAL | Status: DC
  Administered 2020-02-15 – 2020-02-26 (×15): 1 via ORAL
  Filled 2020-02-14 (×21): qty 1

## 2020-02-14 MED ORDER — SODIUM CHLORIDE 0.9 % IV SOLN: INTRAVENOUS | Status: DC

## 2020-02-14 MED ORDER — CHLORHEXIDINE GLUCONATE 0.12 % MT SOLN
15.0000 mL | Freq: Two times a day (BID) | OROMUCOSAL | Status: DC
  Administered 2020-02-14 – 2020-02-28 (×25): 15 mL via OROMUCOSAL
  Filled 2020-02-14 (×22): qty 15

## 2020-02-14 MED ORDER — SODIUM CHLORIDE 0.9 % IV SOLN
INTRAVENOUS | Status: AC
  Filled 2020-02-14 (×4): qty 200

## 2020-02-14 MED ORDER — CALCIUM GLUCONATE-NACL 2-0.675 GM/100ML-% IV SOLN
2.0000 g | Freq: Once | INTRAVENOUS | Status: AC
  Administered 2020-02-14: 17:00:00 2000 mg via INTRAVENOUS
  Filled 2020-02-14: qty 100

## 2020-02-14 MED ORDER — CHLORHEXIDINE GLUCONATE CLOTH 2 % EX PADS
6.0000 | MEDICATED_PAD | Freq: Every day | CUTANEOUS | Status: DC
  Administered 2020-02-14 – 2020-02-18 (×5): 6 via TOPICAL

## 2020-02-14 MED ORDER — HEPARIN SODIUM (PORCINE) 1000 UNIT/ML IJ SOLN
2.4000 mL | Freq: Once | INTRAMUSCULAR | Status: AC
  Administered 2020-02-14: 2400 [IU] via INTRAVENOUS
  Filled 2020-02-14: qty 3

## 2020-02-14 MED ORDER — ACETAMINOPHEN 650 MG RE SUPP
650.0000 mg | Freq: Four times a day (QID) | RECTAL | Status: DC | PRN
  Filled 2020-02-14: qty 1

## 2020-02-14 MED ORDER — CALCIUM CARBONATE ANTACID 500 MG PO CHEW
2.0000 | CHEWABLE_TABLET | ORAL | Status: AC
  Administered 2020-02-14: 400 mg via ORAL

## 2020-02-14 MED ORDER — ONDANSETRON HCL 4 MG/2ML IJ SOLN
4.0000 mg | INTRAMUSCULAR | Status: DC | PRN
  Administered 2020-02-14 – 2020-02-28 (×6): 4 mg via INTRAVENOUS
  Filled 2020-02-14 (×7): qty 2

## 2020-02-14 NOTE — Progress Notes (Signed)
  Echocardiogram 2D Echocardiogram has been performed.  [name] 02/14/2020, 3:30 PM

## 2020-02-14 NOTE — Progress Notes (Signed)
PT Cancellation Note  Patient Details Name: SIDNE LIQUORI MRN: [id number] DOB: 1945/12/26   Cancelled Treatment:    Reason Eval/Treat Not Completed: Active bedrest order. Pt currently on strict bedrest. Will await for mobility progression order to complete PT eval. PT to return as able, as appropriate.   Kittie Plater, PT, DPT Acute Rehabilitation Services Pager #: [phone] Office #: [phone]    Berline Lopes 02/14/2020, 9:35 AM

## 2020-02-14 NOTE — Social Work (Signed)
CSW unable to complete sbirt at this time due to pt being on ventilator. CSW will try to complete when pt is medically able.  Emeterio Reeve, Latanya Presser, [place] Licensed Clinical Social Worker [phone]

## 2020-02-14 NOTE — Progress Notes (Signed)
Spoke with Neuro MD regarding patient's somnolence/lethargy.  Patient was given Phenergan earlier in the day for nausea and according to day shift RN patient has been lethargic since then.  Alerted MD to this and he sated that he thought it would be prudent to get a Head CT scan in the AM.  He also stated that if the patient's baseline should deteriorate that we would obtain the scan earlier.  Will continue to observe and act accordingly.

## 2020-02-14 NOTE — Consult Note (Signed)
Chief Complaint: Patient was seen in consultation today for Bone marrow biopsy at the request of Dr Shelda Pal   Supervising Physician: Corrie Mckusick  Patient Status: [facility] - In-pt  History of Present Illness: [name] is a 74 y.o. female   Came to ED with horrible headache CT: IMPRESSION: 1. Acute intraventricular hemorrhage within the right lateral ventricle. No definite parenchymal component identified. This is an unusual pattern, but is perhaps less unlikely in the setting of this patient's abnormal coagulation status. 2. Severe chronic ischemic microangiopathy.  Dr Jana Hakim note: to ED last night with headache and found to have a right lateral ventricle bleed, in the setting of panmyelosis, most c/w polycythemia vera  Scheduled now for Bone marrow biopsy in IR tomorrow am We are arranging with Cytology asap   Past Medical History:  Diagnosis Date  . Hepatitis C   . Hyperlipidemia   . Hypertension   . Osteoporosis     No past surgical history on file.  Allergies: Erythromycin  Medications: Prior to Admission medications   Medication Sig Start Date End Date Taking? Authorizing Provider  ALPRAZolam Duanne Moron) 0.5 MG tablet Take 0.25 mg by mouth daily as needed for anxiety.     [provider]  aspirin 81 MG tablet Take 1 tablet (81 mg total) by mouth daily. 08/17/14   Rai, Vernelle Emerald, MD  atorvastatin (LIPITOR) 10 MG tablet Take 10 mg by mouth every evening.     [provider]  Cholecalciferol (VITAMIN D3) 2000 UNITS TABS Take 2,000 Units by mouth every evening.    [provider]  clobetasol cream (TEMOVATE) 3.70 % Apply 1 application topically daily as needed (itching).  08/11/14   [provider]  HYDROCORTISONE EX Apply 1 application topically 3 (three) times daily as needed (itching).    [provider]  ibandronate (BONIVA) 150 MG tablet Take 150 mg by mouth every 30 (thirty) days. Take in the morning with a  full glass of water, on an empty stomach, and do not take anything else by mouth or lie down for the next 30 min.    [provider]  levothyroxine (SYNTHROID, LEVOTHROID) 125 MCG tablet Take 125 mcg by mouth daily before breakfast.    [provider]  losartan (COZAAR) 100 MG tablet Take 100 mg by mouth every evening.    [provider]  Probiotic Product (ALIGN PO) Take 1 capsule by mouth daily.    [provider]  solifenacin (VESICARE) 10 MG tablet Take 10 mg by mouth every evening.     [provider]     No family history on file.  Social History   Socioeconomic History  . Marital status: Married    Spouse name: Not on file  . Number of children: Not on file  . Years of education: Not on file  . Highest education level: Not on file  Occupational History  . Not on file  Tobacco Use  . Smoking status: Former Smoker  Substance and Sexual Activity  . Alcohol use: No  . Drug use: No  . Sexual activity: Not on file  Other Topics Concern  . Not on file  Social History Narrative  . Not on file   Social Determinants of Health   Financial Resource Strain:   . Difficulty of Paying Living Expenses:   Food Insecurity:   . Worried About Charity fundraiser in the Last Year:   . Arboriculturist in  the Last Year:   Transportation Needs:   . Film/video editor (Medical):   Marland Kitchen Lack of Transportation (Non-Medical):   Physical Activity:   . Days of Exercise per Week:   . Minutes of Exercise per Session:   Stress:   . Feeling of Stress :   Social Connections:   . Frequency of Communication with Friends and Family:   . Frequency of Social Gatherings with Friends and Family:   . Attends Religious Services:   . Active Member of Clubs or Organizations:   . Attends Archivist Meetings:   Marland Kitchen Marital Status:       Review of Systems: A 12 point ROS discussed and pertinent positives are indicated in the HPI above.  All other  systems are negative.  Review of Systems  Constitutional: Positive for activity change and appetite change. Negative for fever.  Respiratory: Negative for cough and shortness of breath.   Cardiovascular: Negative for chest pain.  Gastrointestinal: Negative for abdominal pain.  Neurological: Positive for headaches. Negative for speech difficulty and weakness.  Psychiatric/Behavioral: Negative for behavioral problems and confusion.    Vital Signs: BP (!) 113/54   Pulse 91   Temp 98 F (36.7 C)   Resp 14   Ht '5\' 2"'  (1.575 m)   Wt 155 lb (70.3 kg)   SpO2 94%   BMI 28.35 kg/m   Physical Exam Vitals reviewed.  Cardiovascular:     Rate and Rhythm: Normal rate and regular rhythm.     Heart sounds: Normal heart sounds.  Pulmonary:     Breath sounds: Normal breath sounds.  Abdominal:     Palpations: Abdomen is soft.  Musculoskeletal:        General: Normal range of motion.     Comments: Moves all 4s to command  Skin:    General: Skin is warm.  Neurological:     Mental Status: She is alert and oriented to person, place, and time.  Psychiatric:     Comments: Consented with Husband at bedside     Imaging: CT Head Wo Contrast  Result Date: 02/14/2020 CLINICAL DATA:  Acute onset headache EXAM: CT HEAD WITHOUT CONTRAST TECHNIQUE: Contiguous axial images were obtained from the base of the skull through the vertex without intravenous contrast. COMPARISON:  None. FINDINGS: Brain: There is acute hemorrhage within the right lateral ventricle. No definite intraparenchymal component is identified. There is severe chronic white matter disease, most commonly indicating chronic ischemic microangiopathy. Volume of CSF spaces is normal. No midline shift or other mass effect Vascular: No abnormal hyperdensity of the major intracranial arteries or dural venous sinuses. No intracranial atherosclerosis. Skull: The visualized skull base, calvarium and extracranial soft tissues are normal.  Sinuses/Orbits: No fluid levels or advanced mucosal thickening of the visualized paranasal sinuses. No mastoid or middle ear effusion. The orbits are normal. IMPRESSION: 1. Acute intraventricular hemorrhage within the right lateral ventricle. No definite parenchymal component identified. This is an unusual pattern, but is perhaps less unlikely in the setting of this patient's abnormal coagulation status. 2. Severe chronic ischemic microangiopathy. Critical Value/emergent results were called by telephone at the time of interpretation on 02/14/2020 at 12:31 am to provider DAVID [facility] , who verbally acknowledged these results. Electronically Signed   By: Ulyses Jarred M.D.   On: 02/14/2020 00:32   [place] EKG SITE RITE  Result Date: 02/14/2020 If Site Rite image not attached, placement could not be confirmed due to current cardiac rhythm.   Labs:  CBC: Recent Labs    02/13/20 1953  WBC 12.0*  HGB 17.7*  HCT 55.5*  PLT 990*    COAGS: Recent Labs    02/14/20 0028  INR 1.1  APTT 40*    BMP: Recent Labs    02/13/20 1953  NA 138  K 3.8  CL 105  CO2 22  GLUCOSE 133*  BUN 13  CALCIUM 9.5  CREATININE 0.93  GFRNONAA >60  GFRAA >60    LIVER FUNCTION TESTS: No results for input(s): BILITOT, AST, ALT, ALKPHOS, PROT, ALBUMIN in the last 8760 hours.  TUMOR MARKERS: No results for input(s): AFPTM, CEA, CA199, CHROMGRNA in the last 8760 hours.  Assessment and Plan:  Severe headache Acute intraventricular hemorrhage within the right lateral ventricle.  Panmyelosis Probable Polycythemia Vera per Oncology Scheduled fore Bone marrow biopsy in am Risks and benefits of Bone marrow biopsy was discussed with the patient and/or patient's family including, but not limited to bleeding, infection, damage to adjacent structures or low yield requiring additional tests.  All of the questions were answered and there is agreement to proceed.  Consent signed and in chart.  Thank you for this  interesting consult.  I greatly enjoyed meeting FRIEDA ARNALL and look forward to participating in their care.  A copy of this report was sent to the requesting provider on this date.  Electronically Signed: Lavonia Drafts, PA-C 02/14/2020, 8:50 AM   I spent a total of 20 Minutes    in face to face in clinical consultation, greater than 50% of which was counseling/coordinating care for Bone marrow biopsy

## 2020-02-14 NOTE — Progress Notes (Addendum)
OT Cancellation Note  Patient Details Name: [name] MRN: [id number] DOB: [birth year]   Cancelled Treatment:    Reason Eval/Treat Not Completed: Active bedrest order(Will return as schedule allows. @9 :32)  Second attempt @1051 : Per Xu, hold as pt's platelets are elevated and planning for procedure. Will return as schedule allows.  [place], OTR/L Acute Rehab Pager: [phone] Office: [phone] 02/14/2020, 9:32 AM

## 2020-02-14 NOTE — Progress Notes (Signed)
Preliminary note:  74 y/o [place] woman presenting to ED last night with headache and found to have a right lateral ventricle bleed, in the setting of panmyelosis, most c/w polycythemia vera.  I have discussed the situation with the patient and her husband, arranged for catheter placement, leukopheresis, and requested bone marrow biopsy (patient npo as of now); also appropriate labwork,   She will need repeat labs after pheresis to determine if phlebotomy later today is warranted.  Ful note to follow

## 2020-02-14 NOTE — Progress Notes (Signed)
STROKE TEAM PROGRESS NOTE   INTERVAL HISTORY Husband and son and RN are at bedside. Pt still has HA at bifrontal and at the back of neck. However, fully awake alert and orientated, slight left facial droop but no other focal deficit. Pending swallow screen. On low dose cleviprex.   Vitals:   02/14/20 0700 02/14/20 0715 02/14/20 0730 02/14/20 0800  BP: (!) 113/54     Pulse: 91 (!) 107 91   Resp: 15 19 14    Temp:    98 F (36.7 C)  TempSrc:      SpO2: 93% 93% 94%   Weight:      Height:        CBC:  Recent Labs  Lab 02/13/20 1953  WBC 12.0*  HGB 17.7*  HCT 55.5*  MCV 92.2  PLT 990*    Basic Metabolic Panel:  Recent Labs  Lab 02/13/20 1953  NA 138  K 3.8  CL 105  CO2 22  GLUCOSE 133*  BUN 13  CREATININE 0.93  CALCIUM 9.5   Lipid Panel: No results found for: CHOL, TRIG, HDL, CHOLHDL, VLDL, LDLCALC HgbA1c:  Lab Results  Component Value Date   HGBA1C 5.9 (H) 08/16/2014   Urine Drug Screen: No results found for: LABOPIA, COCAINSCRNUR, LABBENZ, AMPHETMU, THCU, LABBARB  Alcohol Level No results found for: ETH  IMAGING past 24 hours CT HEAD WO CONTRAST  Result Date: 02/14/2020 CLINICAL DATA:  Follow-up hemorrhage EXAM: CT HEAD WITHOUT CONTRAST TECHNIQUE: Contiguous axial images were obtained from the base of the skull through the vertex without intravenous contrast. COMPARISON:  Earlier same day, 8 hours ago. FINDINGS: Brain: Intraventricular hemorrhage within the right lateral ventricle persists, approximately the same volume without evidence of ongoing or additional bleeding. Small amount of blood present within the fourth ventricle and left lateral ventricle. Small amount of subarachnoid blood visible within a left parietal sulcus. No overall change in ventricular size. Widespread chronic microangiopathic change of the white matter as seen previously. Old small vessel infarction in the right basal ganglia. No sign of acute infarction. I do not identify an intraparenchymal  source. Vascular: There is atherosclerotic calcification of the major vessels at the base of the brain. Skull: Negative Sinuses/Orbits: Clear/normal Other: None IMPRESSION: No significant change since 8 hours ago. Intraventricular hemorrhage primarily within the right lateral ventricle. No evidence of ongoing bleeding. Small amount of blood dependent within the left lateral ventricle and within the fourth ventricle. Ventricular size is stable. Small amount of subarachnoid blood evident in a left parietal sulcus. Electronically Signed   By: Nelson Chimes M.D.   On: 02/14/2020 08:57   CT Head Wo Contrast  Result Date: 02/14/2020 CLINICAL DATA:  Acute onset headache EXAM: CT HEAD WITHOUT CONTRAST TECHNIQUE: Contiguous axial images were obtained from the base of the skull through the vertex without intravenous contrast. COMPARISON:  None. FINDINGS: Brain: There is acute hemorrhage within the right lateral ventricle. No definite intraparenchymal component is identified. There is severe chronic white matter disease, most commonly indicating chronic ischemic microangiopathy. Volume of CSF spaces is normal. No midline shift or other mass effect Vascular: No abnormal hyperdensity of the major intracranial arteries or dural venous sinuses. No intracranial atherosclerosis. Skull: The visualized skull base, calvarium and extracranial soft tissues are normal. Sinuses/Orbits: No fluid levels or advanced mucosal thickening of the visualized paranasal sinuses. No mastoid or middle ear effusion. The orbits are normal. IMPRESSION: 1. Acute intraventricular hemorrhage within the right lateral ventricle. No definite parenchymal component identified.  This is an unusual pattern, but is perhaps less unlikely in the setting of this patient's abnormal coagulation status. 2. Severe chronic ischemic microangiopathy. Critical Value/emergent results were called by telephone at the time of interpretation on 02/14/2020 at 12:31 am to provider  [facility] , who verbally acknowledged these results. Electronically Signed   By: Ulyses Jarred M.D.   On: 02/14/2020 00:32   [place] EKG SITE RITE  Result Date: 02/14/2020 If Site Rite image not attached, placement could not be confirmed due to current cardiac rhythm.   PHYSICAL EXAM  Temp:  [97.9 F (36.6 C)-98.5 F (36.9 C)] 98 F (36.7 C) (04/05 0800) Pulse Rate:  [78-149] 91 (04/05 0730) Resp:  [10-21] 14 (04/05 0730) BP: (104-165)/(54-98) 113/54 (04/05 0700) SpO2:  [91 %-97 %] 94 % (04/05 0730) Weight:  [70.3 kg] 70.3 kg (04/04 1938)  General - Well nourished, well developed, in mild distress due to HA.  Ophthalmologic - fundi not visualized due to noncooperation.  Cardiovascular - Regular rhythm and rate.  Mental Status -  Level of arousal and orientation to time, place, and person were intact. Mild nuchal rigidity with pain on neck flexion Language including expression, naming, repetition, comprehension was assessed and found intact. Fund of Knowledge was assessed and was intact.  Cranial Nerves II - XII - II - Visual field intact OU. III, IV, VI - Extraocular movements intact. V - Facial sensation intact bilaterally. VII - mild left facial droop. VIII - Hearing & vestibular intact bilaterally. X - Palate elevates symmetrically. XI - Chin turning & shoulder shrug intact bilaterally. XII - Tongue protrusion intact.  Motor Strength - The patient's strength was normal in all extremities and pronator drift was absent.  Bulk was normal and fasciculations were absent.   Motor Tone - Muscle tone was assessed at the neck and appendages and was normal.  Reflexes - The patient's reflexes were symmetrical in all extremities and she had no pathological reflexes.  Sensory - Light touch, temperature/pinprick were assessed and were symmetrical.    Coordination - The patient had normal movements in the hands with no ataxia or dysmetria.  Tremor was absent.  Gait and Station -  deferred.   ASSESSMENT/PLAN [name] is a 74 y.o. female with history of HTN presenting with severe persistent HA accompanied by nausea and vomiting.   IVH - right lateral ventricle IVH secondary to HTN vs. CAA  CT head 4/5 0032 R lateral IVH. Severe chronic ischemic microangiopathy.  CT head 4/5 0857 no sign change  MRI  pending  MRA  pending  2D Echo pending  LDL 92   HgbA1c 6.1   SCDs for VTE prophylaxis  aspirin 81 mg daily prior to admission, now on No antithrombotic given IVH  Therapy recommendations:  pending   Disposition:  pending   Pancytosis  WBC 12, Hgb 17.1, PLT 990  Dr. Ron Agee on board   For leukopheresis today. May also need phlebotomy  Bone marrow bx pending   erythropoietin level pending   JAK2 pending    Hypertension  Home meds:  losartan 100  BP not significantly elevated on admission  . SBP goal < 140  Now On Cleviprex  Stable . Long-term BP goal normotensive  ? CAA  2015 MRI showed numerous MCBs throughout the brain as well as severe confluent leukoaraiosis   Repeat MRI and MRA pending  As per husband, pt has some anomia as baseline  Hyperlipidemia  Home meds:  lipitor 10  LDL pending  Statin held in setting of acute ICH  Consider continuation of statin at discharge  Dysphagia . Secondary to stroke . NPO . Speech on board . Pending swallow screen   Other Stroke Risk Factors  Advanced age  Former Cigarette smoker  Overweight, Body mass index is 28.35 kg/m., recommend weight loss, diet and exercise as appropriate   Family hx stroke (mother, father, and multiple other family members)  Other Active Problems  Hepatitis C  Hypothyroid on synthroid. TSH WNL    Hospital day # 0  This patient is critically ill due to IVH, pancytosis, HTN vs. CAA and at significant risk of neurological worsening, death form hydrocephalus, recurrent IVH, lobar hemorrhage and sizure. This patient's care requires  constant monitoring of vital signs, hemodynamics, respiratory and cardiac monitoring, review of multiple databases, neurological assessment, discussion with family, other specialists and medical decision making of high complexity. I spent 30 minutes of neurocritical care time in the care of this patient. I had long discussion with husband and son at bedside, updated pt current condition, treatment plan and potential prognosis, and answered all the questions. They expressed understanding and appreciation.   Rosalin Hawking, MD PhD Stroke Neurology 02/14/2020 12:54 PM  To contact Stroke Continuity provider, please refer to [email or web address] After hours, contact General Neurology

## 2020-02-14 NOTE — Procedures (Signed)
Hemodialysis Insertion Procedure Note [name] [id number] [birth year]  Procedure: Insertion of Hemodialysis Catheter Type: 3 port  Indications: Hemodialysis   Procedure Details Consent: Risks of procedure as well as the alternatives and risks of each were explained to the (patient/caregiver).  Consent for procedure obtained. Time Out: Verified patient identification, verified procedure, site/side was marked, verified correct patient position, special equipment/implants available, medications/allergies/relevent history reviewed, required imaging and test results available.  Performed  Maximum sterile technique was used including antiseptics, cap, gloves, gown, hand hygiene, mask and sheet. Skin prep: Chlorhexidine; local anesthetic administered A antimicrobial bonded/coated triple lumen catheter was placed in the right internal jugular vein using the Seldinger technique. Ultrasound guidance used.Yes.   Catheter placed to 16 cm. Blood aspirated via all 3 ports and then flushed x 3. Line sutured x 2 and dressing applied.  Evaluation Blood flow good Complications: No apparent complications Patient did tolerate procedure well. Chest X-ray ordered to verify placement.  CXR: normal.  Marianna Payment, D.O. Date 02/14/2020 Time 3:41 PM [facility] Internal Medicine, PGY-1 Pager: [phone]

## 2020-02-14 NOTE — H&P (Signed)
Neurology H&P  CC: Headache  History is obtained from: Patient  HPI: [name] is a 74 y.o. female with a history of hypertension who presents with headache that started this morning.  She states that she was sitting in front computer and noticed that the headache was at the top of her neck.  Is fairly severe, and has been persistent since onset.  It is primarily occipital in location.  Because of this she sought care in the emergency department, where routine blood work revealed evidence of thrombocytosis.  She had a CT scan given the new onset headache which reveals intraventricular hemorrhage.  She denies any antecedent illness, weakness, numbness, visual change.  She does have some nausea and vomiting has been present over the course the day.  LKW: Midmorning tpa given?: No, ICH IR Thrombectomy? No, ICH Modified Rankin Scale: 0-Completely asymptomatic and back to baseline post- stroke NIHSS: 0 ICH score: 1   ROS: A complete ROS was performed and is negative except as noted in the HPI.   Past Medical History:  Diagnosis Date  . Hepatitis C   . Hyperlipidemia   . Hypertension   . Osteoporosis      Family history: Multiple family members with stroke including her mother and father  Social History:  reports that she has quit smoking. She does not have any smokeless tobacco history on file. She reports that she does not drink alcohol or use drugs.   Prior to Admission medications   Medication Sig Start Date End Date Taking? Authorizing Provider  ALPRAZolam Duanne Moron) 0.5 MG tablet Take 0.25 mg by mouth daily as needed for anxiety.     [provider]  aspirin 81 MG tablet Take 1 tablet (81 mg total) by mouth daily. 08/17/14   Rai, Vernelle Emerald, MD  atorvastatin (LIPITOR) 10 MG tablet Take 10 mg by mouth every evening.     [provider]  Cholecalciferol (VITAMIN D3) 2000 UNITS TABS Take 2,000 Units by mouth every evening.    [provider]   clobetasol cream (TEMOVATE) [id number] % Apply 1 application topically daily as needed (itching).  08/11/14   [provider]  HYDROCORTISONE EX Apply 1 application topically 3 (three) times daily as needed (itching).    [provider]  ibandronate (BONIVA) 150 MG tablet Take 150 mg by mouth every 30 (thirty) days. Take in the morning with a full glass of water, on an empty stomach, and do not take anything else by mouth or lie down for the next 30 min.    [provider]  levothyroxine (SYNTHROID, LEVOTHROID) 125 MCG tablet Take 125 mcg by mouth daily before breakfast.    [provider]  losartan (COZAAR) 100 MG tablet Take 100 mg by mouth every evening.    [provider]  Probiotic Product (ALIGN PO) Take 1 capsule by mouth daily.    [provider]  solifenacin (VESICARE) 10 MG tablet Take 10 mg by mouth every evening.     [provider]     Exam: Current vital signs: BP (!) 165/80   Pulse 79   Temp 97.9 F (36.6 C) (Oral)   Resp 16   Ht 5\' 2"  (1.575 m)   Wt 70.3 kg   SpO2 97%   BMI 28.35 kg/m    Physical Exam  Constitutional: Appears well-developed and well-nourished.  Psych: Affect appropriate to situation Eyes: No scleral injection HENT: No OP obstrucion Head: Normocephalic.  Cardiovascular: Normal rate and  regular rhythm.  Respiratory: Effort normal and breath sounds normal to anterior ascultation GI: Soft.  No distension. There is no tenderness.  Skin: WDI  Neuro: Mental Status: Patient is awake, alert, oriented to person, place, month, year, and situation. Patient is able to give a clear and coherent history. No signs of aphasia or neglect Cranial Nerves: II: Visual Fields are full. Pupils are equal, round, and reactive to light.   III,IV, VI: EOMI without ptosis or diplopia.  V: Facial sensation is symmetric to temperature VII: Facial movement is symmetric.  VIII: hearing is intact to voice X: Uvula  is midline and palate elevates symmetrically XI: Shoulder shrug is symmetric. XII: tongue is midline without atrophy or fasciculations.  Motor: Tone is normal. Bulk is normal. 5/5 strength was present in all four extremities.  Sensory: Sensation is symmetric to light touch and temperature in the arms and legs. Cerebellar: FNF and HKS are intact bilaterally   I have reviewed labs in epic and the pertinent results are: WBC 12 HgB 17 Plt 990 Bmp unremrakable  I have reviewed the images obtained:CT head - IVH  Primary Diagnosis:  Nontraumatic intracerebral hemorrhage, intraventricular  Secondary Diagnosis: Polycythemia Thrombocytosis, possibly coagulopathic 2/2 this.   Impression: 74 year old female with intraventricular hemorrhage in the setting of severe thrombocytosis.  She does have a history of hypertension, and hypertensive hemorrhage that is just adjacent to the left ventricle with intraventricular extension is most likely etiology.  Certainly any coagulopathy woul contribute.  Plan: 1) Admit to ICU 2) no antiplatelets or anticoagulants 3) blood pressure control with goal systolic [id number], Cleviprex 4) Frequent neuro checks 5) If symptoms worsen or there is decreased mental status, repeat stat head CT 6) PT,OT,ST 7) heme-onc consult for thrombocytosis, ?  Need for platelet pheresis    This patient is critically ill and at significant risk of neurological worsening, death and care requires constant monitoring of vital signs, hemodynamics,respiratory and cardiac monitoring, neurological assessment, discussion with family, other specialists and medical decision making of high complexity. I spent 50 minutes of neurocritical care time  in the care of  this patient. This was time spent independent of any time provided by nurse practitioner or PA.  Roland Rack, MD [facility] [phone]  If 7pm- 7am, please page neurology on call as listed in  [place].

## 2020-02-14 NOTE — Progress Notes (Signed)
[place]  Telephone:([phone]) [phone] Fax:(336) [phone]     ID: KHLOIE HAMADA DOB: Apr 18, 1946  MR#: [id number]  HYI#:[id number]  Patient Care Team: Cari Caraway, MD as PCP - General (Family Medicine) Chauncey Cruel, MD OTHER MD:  CHIEF COMPLAINT: Intracranial bleed, polycythemia  CURRENT TREATMENT: Pheresis, consider Hydrea, consider phlebotomy   HISTORY OF CURRENT ILLNESS: Ms. Impastato has a history of hypertension followed by Dr. Theadore Nan.  For the last few days she has had headache and she took her blood pressure and it was as high as 168/102 which is unusual for her.  As this was not improving she presented to the emergency room last night 02/13/2020. Dr Roxanne Mins obtained a basic lab work and a head CT.  The head CT without contrast showed an acute intraventricular hemorrhage in the right lateral ventricle, with no parenchymal component.  The lab work showed a white cell count of 12.0, hemoglobin 17.7, and platelets 990,000.  We were consulted for further evaluation and treatment.  INTERVAL HISTORY: I met with the patient and her husband [name] in her hospital room the morning of 02/14/2020  REVIEW OF SYSTEMS: Annelise's headache is a little better but it is still present.  She has had significant vomiting, although not in the last few hours.  She has had no visual changes, no focal weakness, no problems with balance and no recent falls.  She denies pruritus, unexplained weight loss or unexplained fatigue.  There have not been any intercurrent fevers.  A detailed review of systems was otherwise noncontributory  PAST MEDICAL HISTORY: Past Medical History:  Diagnosis Date  . Hepatitis C   . Hyperlipidemia   . Hypertension   . Osteoporosis     PAST SURGICAL HISTORY: No past surgical history on file.  FAMILY HISTORY No family history on file. The patient's father died at age 23 and the patient's mother at age 67 both from heart disease.  The patient has 1  sister, no brothers.  There is no family history of blood problems and no family history of cancer to the patient's knowledge  GYNECOLOGIC HISTORY:  No LMP recorded. Patient is postmenopausal. Menarche: 74 years old Age at first live birth: 74 years old [employer] P 2 LMP 84 HRT yes, a few years  Hysterectomy?  Salpingo-oophorectomy?  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  Her husband [name] ran a business but is now retired.  Their son [name] is a Adult nurse.  Their son Aaron Edelman is an Pharmacologist.  The patient has 1 grandson and 4 step grandchildren.  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    ADVANCED DIRECTIVES: In the absence of any documents to the contrary the patient's husband is her healthcare power of attorney   HEALTH MAINTENANCE: Social History   Tobacco Use  . Smoking status: Former Smoker  Substance Use Topics  . Alcohol use: No  . Drug use: No     Colonoscopy: [facility]  PAP: [place]  Bone density:  Mammogram: Due   Allergies  Allergen Reactions  . Erythromycin Itching and Other (See Comments)    Severe stomach pains, diarrhea    Current Facility-Administered Medications  Medication Dose Route Frequency Provider Last Rate Last Admin  .  stroke: mapping our early stages of recovery book   Does not apply Once Greta Doom, MD      . acetaminophen (TYLENOL) tablet 650 mg  650 mg Oral Q4H PRN Greta Doom, MD  650 mg at 02/14/20 0235   Or  . acetaminophen (TYLENOL) 160 MG/5ML solution 650 mg  650 mg Per Tube Q4H PRN Greta Doom, MD       Or  . acetaminophen (TYLENOL) suppository 650 mg  650 mg Rectal Q4H PRN Greta Doom, MD      . chlorhexidine (PERIDEX) 0.12 % solution 15 mL  15 mL Mouth Rinse BID Greta Doom, MD      . Chlorhexidine Gluconate Cloth 2 % PADS 6 each  6 each Topical Q0600 Greta Doom, MD      . clevidipine (CLEVIPREX) infusion 0.5 mg/mL  0-21 mg/hr Intravenous  Continuous Greta Doom, MD 6 mL/hr at 02/14/20 0700 3 mg/hr at 02/14/20 0700  . ondansetron (ZOFRAN) injection 4 mg  4 mg Intravenous Q4H PRN Greta Doom, MD   4 mg at 02/14/20 0250  . pantoprazole (PROTONIX) injection 40 mg  40 mg Intravenous QHS Greta Doom, MD   40 mg at 02/14/20 0301  . senna-docusate (Senokot-S) tablet 1 tablet  1 tablet Oral BID Greta Doom, MD      . sodium chloride 0.9 % bolus 1,000 mL  1,000 mL Intravenous Once Delora Fuel, MD        OBJECTIVE: Middle-aged white woman examined in bed  Vitals:   02/14/20 0730 02/14/20 0800  BP:    Pulse: 91   Resp: 14   Temp:  98 F (36.7 C)  SpO2: 94%      Body mass index is 28.35 kg/m.   Wt Readings from Last 3 Encounters:  02/13/20 155 lb (70.3 kg)  08/15/14 149 lb 0.5 oz (67.6 kg)      LAB RESULTS:  CMP     Component Value Date/Time   NA 138 02/13/2020 1953   K 3.8 02/13/2020 1953   CL 105 02/13/2020 1953   CO2 22 02/13/2020 1953   GLUCOSE 133 (H) 02/13/2020 1953   BUN 13 02/13/2020 1953   CREATININE 0.93 02/13/2020 1953   CALCIUM 9.5 02/13/2020 1953   PROT 6.9 08/15/2014 1838   ALBUMIN 4.1 08/15/2014 1838   AST 18 08/15/2014 1838   ALT 21 08/15/2014 1838   ALKPHOS 70 08/15/2014 1838   BILITOT 0.9 08/15/2014 1838   GFRNONAA >60 02/13/2020 1953   GFRAA >60 02/13/2020 1953    No results found for: TOTALPROTELP, ALBUMINELP, A1GS, A2GS, BETS, BETA2SER, GAMS, MSPIKE, SPEI  No results found for: KPAFRELGTCHN, LAMBDASER, KAPLAMBRATIO  Lab Results  Component Value Date   WBC 12.0 (H) 02/13/2020   NEUTROABS 6.3 08/15/2014   HGB 17.7 (H) 02/13/2020   HCT 55.5 (H) 02/13/2020   MCV 92.2 02/13/2020   PLT 990 (HH) 02/13/2020    '@LASTCHEMISTRY' @  No results found for: LABCA2  No components found for: [id number]  Recent Labs  Lab 02/14/20 0028  INR 1.1    No results found for: LABCA2  No results found for: YQM578  No results found for: ION629  No  results found for: BMW413  No results found for: CA2729  No components found for: HGQUANT  No results found for: CEA1 / No results found for: CEA1   No results found for: AFPTUMOR  No results found for: CHROMOGRNA  No results found for: PSA1  Admission on 02/13/2020  Component Date Value Ref Range Status  . Sodium 02/13/2020 138  135 - 145 mmol/L Final  . Potassium 02/13/2020 3.8  3.5 - 5.1 mmol/L Final  . Chloride  02/13/2020 105  98 - 111 mmol/L Final  . CO2 02/13/2020 22  22 - 32 mmol/L Final  . Glucose, Bld 02/13/2020 133* 70 - 99 mg/dL Final   Glucose reference range applies only to samples taken after fasting for at least 8 hours.  . BUN 02/13/2020 13  8 - 23 mg/dL Final  . Creatinine, Ser 02/13/2020 0.93  0.44 - 1.00 mg/dL Final  . Calcium 02/13/2020 9.5  8.9 - 10.3 mg/dL Final  . GFR calc non Af Amer 02/13/2020 >60  >60 mL/min Final  . GFR calc Af Amer 02/13/2020 >60  >60 mL/min Final  . Anion gap 02/13/2020 11  5 - 15 Final   Performed at [facility], [place] [address], [place], [place] [address]  . WBC 02/13/2020 12.0* 4.0 - 10.5 K/uL Final  . RBC 02/13/2020 6.02* 3.87 - 5.11 MIL/uL Final  . Hemoglobin 02/13/2020 17.7* 12.0 - 15.0 g/dL Final  . HCT 02/13/2020 55.5* 36.0 - 46.0 % Final  . MCV 02/13/2020 92.2  80.0 - 100.0 fL Final  . MCH 02/13/2020 29.4  26.0 - 34.0 pg Final  . MCHC 02/13/2020 31.9  30.0 - 36.0 g/dL Final  . RDW 02/13/2020 14.3  11.5 - 15.5 % Final  . Platelets 02/13/2020 990* 150 - 400 K/uL Final   This critical result has verified and been called to C.CHIRSO,RN by Susy Manor on 04 04 2021 at 2032, and has been read back.   Marland Kitchen nRBC 02/13/2020 0.0  0.0 - 0.2 % Final   Performed at [facility], [place] [address] [place], [place] [address]  . Prothrombin Time 02/14/2020 13.6  11.4 - 15.2 seconds Final  . INR 02/14/2020 1.1  0.8 - 1.2 Final   Comment: (NOTE) INR goal varies based on device and disease states. Performed at [facility], [place] [address], [place], [place] [address]   . aPTT 02/14/2020 40* 24 - 36 seconds Final   Comment:        IF BASELINE aPTT IS ELEVATED, SUGGEST PATIENT RISK ASSESSMENT BE USED TO DETERMINE APPROPRIATE ANTICOAGULANT THERAPY. Performed at [facility], [place] [address], [place], [place] [address]   . SARS Coronavirus 2 by RT PCR 02/14/2020 NEGATIVE  NEGATIVE Final   Comment: (NOTE) SARS-CoV-2 target nucleic acids are NOT DETECTED. The SARS-CoV-2 RNA is generally detectable in upper respiratoy specimens during the acute phase of infection. The lowest concentration of SARS-CoV-2 viral copies this assay can detect is 131 copies/mL. A negative result does not preclude SARS-Cov-2 infection and should not be used as the sole basis for treatment or other patient management decisions. A negative result may occur with  improper specimen collection/handling, submission of specimen other than nasopharyngeal swab, presence of viral mutation(s) within the areas targeted by this assay, and inadequate number of viral copies (<131 copies/mL). A negative result must be combined with clinical observations, patient history, and epidemiological information. The expected result is Negative. Fact Sheet for Patients:  PinkCheek.be Fact Sheet for Healthcare Providers:  GravelBags.it This test is not yet ap                          proved or cleared by the [place] FDA and  has been authorized for detection and/or diagnosis of SARS-CoV-2 by FDA under an Emergency Use Authorization (EUA). This EUA will remain  in effect (meaning this test can be used) for the duration of  the COVID-19 declaration under Section 564(b)(1) of the Act, 21 U.S.C. section 360bbb-3(b)(1), unless the authorization is terminated or revoked sooner.   . Influenza A by PCR 02/14/2020 NEGATIVE  NEGATIVE Final  . Influenza B by PCR 02/14/2020 NEGATIVE  NEGATIVE  Final   Comment: (NOTE) The Xpert Xpress SARS-CoV-2/FLU/RSV assay is intended as an aid in  the diagnosis of influenza from Nasopharyngeal swab specimens and  should not be used as a sole basis for treatment. Nasal washings and  aspirates are unacceptable for Xpert Xpress SARS-CoV-2/FLU/RSV  testing. Fact Sheet for Patients: PinkCheek.be Fact Sheet for Healthcare Providers: GravelBags.it This test is not yet approved or cleared by the [place] FDA and  has been authorized for detection and/or diagnosis of SARS-CoV-2 by  FDA under an Emergency Use Authorization (EUA). This EUA will remain  in effect (meaning this test can be used) for the duration of the  Covid-19 declaration under Section 564(b)(1) of the Act, 21  U.S.C. section 360bbb-3(b)(1), unless the authorization is  terminated or revoked. Performed at [facility], [place] [address], [place], [place] [address]   . MRSA by PCR 02/14/2020 NEGATIVE  NEGATIVE Final   Comment:        The GeneXpert MRSA Assay (FDA approved for NASAL specimens only), is one component of a comprehensive MRSA colonization surveillance program. It is not intended to diagnose MRSA infection nor to guide or monitor treatment for MRSA infections. Performed at [facility], [place] [address], [place], [place] [address]     (this displays the last labs from the last 3 days)  No results found for: TOTALPROTELP, ALBUMINELP, A1GS, A2GS, BETS, BETA2SER, GAMS, MSPIKE, SPEI (this displays SPEP labs)  No results found for: KPAFRELGTCHN, LAMBDASER, KAPLAMBRATIO (kappa/lambda light chains)  No results found for: HGBA, HGBA2QUANT, HGBFQUANT, HGBSQUAN (Hemoglobinopathy evaluation)   No results found for: LDH  No results found for: IRON, TIBC, IRONPCTSAT (Iron and TIBC)  No results found for: FERRITIN  Urinalysis    Component Value Date/Time   COLORURINE YELLOW 08/15/2014 2129    APPEARANCEUR CLEAR 08/15/2014 2129   LABSPEC 1.013 08/15/2014 2129   PHURINE 6.0 08/15/2014 2129   [place] 08/15/2014 2129   HGBUR NEGATIVE 08/15/2014 2129   BILIRUBINUR NEGATIVE 08/15/2014 2129   KETONESUR 15 (A) 08/15/2014 2129   PROTEINUR NEGATIVE 08/15/2014 2129   UROBILINOGEN 0.2 08/15/2014 2129   NITRITE NEGATIVE 08/15/2014 2129   LEUKOCYTESUR NEGATIVE 08/15/2014 2129     STUDIES: CT Head Wo Contrast  Result Date: 02/14/2020 CLINICAL DATA:  Acute onset headache EXAM: CT HEAD WITHOUT CONTRAST TECHNIQUE: Contiguous axial images were obtained from the base of the skull through the vertex without intravenous contrast. COMPARISON:  None. FINDINGS: Brain: There is acute hemorrhage within the right lateral ventricle. No definite intraparenchymal component is identified. There is severe chronic white matter disease, most commonly indicating chronic ischemic microangiopathy. Volume of CSF spaces is normal. No midline shift or other mass effect Vascular: No abnormal hyperdensity of the major intracranial arteries or dural venous sinuses. No intracranial atherosclerosis. Skull: The visualized skull base, calvarium and extracranial soft tissues are normal. Sinuses/Orbits: No fluid levels or advanced mucosal thickening of the visualized paranasal sinuses. No mastoid or middle ear effusion. The orbits are normal. IMPRESSION: 1. Acute intraventricular hemorrhage within the right lateral ventricle. No definite parenchymal component identified. This is an unusual pattern, but is perhaps less unlikely in the setting of this patient's abnormal coagulation status. 2. Severe chronic ischemic microangiopathy.  Critical Value/emergent results were called by telephone at the time of interpretation on 02/14/2020 at 12:31 am to provider DAVID [facility] , who verbally acknowledged these results. Electronically Signed   By: Ulyses Jarred M.D.   On: 02/14/2020 00:32   [place] EKG SITE RITE  Result Date: 02/14/2020 If Site  Rite image not attached, placement could not be confirmed due to current cardiac rhythm.   ELIGIBLE FOR AVAILABLE RESEARCH PROTOCOL: no  ASSESSMENT: 74 y.o. [place] woman presenting 02/13/2020 with headache, found to have a right lateral ventricle bleed, in the setting of panmyelosis, most c/w polycythemia vera.   REVIEW OF THE BLOOD FILM: The red cells show rouleaux but no significant anisocytosis and specifically no tailed poikilocytes and no nucleated red cells.  Thrombocytosis is confirmed and there are some large platelets present.  There are no clumps.  The white cell series shows an elevated count but no significant left shift, no dysplasia and no blasts noted.  (1) leukapheresis to most rapidly bring down the platelet and hemoglobin  (2) bone marrow biopsy and lab work written with a view to definitive diagnosis of polycythemia vera  (3) rule out acquired von Willebrand disease  PLAN: I discussed the working diagnosis of polycythemia vera with the patient and her husband.  They understand that there are 3 main types of blood cells, red cells, white cells, and platelets.  Red cells carry oxygen and she has too many of them, the opposite of anemia.  Platelets are clotting cells and even though she had a bleed as she has too many platelets.  The plate that she has are likely not functional.  In addition she was taking aspirin which of course "poisons" platelets.  The third type of blood cell, the white cells, are really our immune system.  To many white cells can be due to leukemia but I reassured her that I do not see leukemia when I look in her blood film this morning.  We discussed the fact that the working diagnosis is polycythemia vera.  This is most commonly due to an acquired mutation in JAK2.  Diagnosis requires evaluation of that mutation, bone marrow biopsy, and erythropoietin level all of which have been ordered.  In terms of treatment we would like her hematocrit to be less  than 45 and her platelet count and white cells to be closer to normal.  The quickest way to accomplish this would be pheresis.  I have arranged for her to have a central line placed and pheresis later today.  After the patient has been pheresed we will need a new set of labs, especially CBC to decide if she needs phlebotomy in addition.  She likely will need Hydrea as well giving the significant leukocytosis.  I will follow with you.  Chauncey Cruel, MD   02/14/2020 8:37 AM Medical Oncology and Hematology [facility] [address] [place], [place] [address] Tel. [phone]    Fax. [phone]

## 2020-02-15 ENCOUNTER — Other Ambulatory Visit (HOSPITAL_COMMUNITY): Payer: Medicare HMO

## 2020-02-15 ENCOUNTER — Inpatient Hospital Stay (HOSPITAL_COMMUNITY): Payer: Medicare HMO

## 2020-02-15 DIAGNOSIS — I10 Essential (primary) hypertension: Secondary | ICD-10-CM | POA: Diagnosis not present

## 2020-02-15 DIAGNOSIS — E7800 Pure hypercholesterolemia, unspecified: Secondary | ICD-10-CM | POA: Diagnosis not present

## 2020-02-15 DIAGNOSIS — D45 Polycythemia vera: Secondary | ICD-10-CM | POA: Diagnosis not present

## 2020-02-15 DIAGNOSIS — I615 Nontraumatic intracerebral hemorrhage, intraventricular: Secondary | ICD-10-CM | POA: Diagnosis not present

## 2020-02-15 LAB — CBC WITH DIFFERENTIAL/PLATELET
Abs Immature Granulocytes: 0.27 10*3/uL — ABNORMAL HIGH (ref 0.00–0.07)
Basophils Absolute: 0.1 10*3/uL (ref 0.0–0.1)
Basophils Relative: 0 %
Eosinophils Absolute: 0 10*3/uL (ref 0.0–0.5)
Eosinophils Relative: 0 %
HCT: 58.5 % — ABNORMAL HIGH (ref 36.0–46.0)
Hemoglobin: 18.6 g/dL — ABNORMAL HIGH (ref 12.0–15.0)
Immature Granulocytes: 1 %
Lymphocytes Relative: 5 %
Lymphs Abs: 1.2 10*3/uL (ref 0.7–4.0)
MCH: 29.4 pg (ref 26.0–34.0)
MCHC: 31.8 g/dL (ref 30.0–36.0)
MCV: 92.4 fL (ref 80.0–100.0)
Monocytes Absolute: 1.5 10*3/uL — ABNORMAL HIGH (ref 0.1–1.0)
Monocytes Relative: 6 %
Neutro Abs: 21.3 10*3/uL — ABNORMAL HIGH (ref 1.7–7.7)
Neutrophils Relative %: 88 %
Platelets: 738 10*3/uL — ABNORMAL HIGH (ref 150–400)
RBC: 6.33 MIL/uL — ABNORMAL HIGH (ref 3.87–5.11)
RDW: 15.1 % (ref 11.5–15.5)
WBC: 24.4 10*3/uL — ABNORMAL HIGH (ref 4.0–10.5)
nRBC: 0 % (ref 0.0–0.2)

## 2020-02-15 LAB — ERYTHROPOIETIN: Erythropoietin: 1.3 m[IU]/mL — ABNORMAL LOW (ref 2.6–18.5)

## 2020-02-15 MED ORDER — AMLODIPINE BESYLATE 5 MG PO TABS
5.0000 mg | ORAL_TABLET | Freq: Every day | ORAL | Status: DC
  Administered 2020-02-15 – 2020-02-16 (×2): 5 mg via ORAL
  Filled 2020-02-15 (×2): qty 1

## 2020-02-15 MED ORDER — PANTOPRAZOLE SODIUM 40 MG PO TBEC
40.0000 mg | DELAYED_RELEASE_TABLET | Freq: Every day | ORAL | Status: DC
  Administered 2020-02-15 – 2020-02-26 (×11): 40 mg via ORAL
  Filled 2020-02-15 (×12): qty 1

## 2020-02-15 MED ORDER — FENTANYL CITRATE (PF) 100 MCG/2ML IJ SOLN
INTRAMUSCULAR | Status: AC | PRN
  Administered 2020-02-15 (×2): 25 ug via INTRAVENOUS

## 2020-02-15 MED ORDER — MIDAZOLAM HCL 2 MG/2ML IJ SOLN
INTRAMUSCULAR | Status: AC
  Filled 2020-02-15: qty 2

## 2020-02-15 MED ORDER — MIDAZOLAM HCL 2 MG/2ML IJ SOLN
INTRAMUSCULAR | Status: AC | PRN
  Administered 2020-02-15 (×2): 0.5 mg via INTRAVENOUS

## 2020-02-15 MED ORDER — LOSARTAN POTASSIUM 50 MG PO TABS
50.0000 mg | ORAL_TABLET | Freq: Two times a day (BID) | ORAL | Status: DC
  Administered 2020-02-15 – 2020-02-26 (×21): 50 mg via ORAL
  Filled 2020-02-15 (×23): qty 1

## 2020-02-15 MED ORDER — FENTANYL CITRATE (PF) 100 MCG/2ML IJ SOLN
INTRAMUSCULAR | Status: AC
  Filled 2020-02-15: qty 2

## 2020-02-15 MED ORDER — LEVOTHYROXINE SODIUM 25 MCG PO TABS: 125.0000 ug | ORAL_TABLET | Freq: Every day | ORAL | Status: DC

## 2020-02-15 MED ORDER — LEVOTHYROXINE SODIUM 25 MCG PO TABS
125.0000 ug | ORAL_TABLET | Freq: Every day | ORAL | Status: DC
  Administered 2020-02-16 – 2020-02-26 (×11): 125 ug via ORAL
  Filled 2020-02-15 (×11): qty 1

## 2020-02-15 MED ORDER — HYDROXYUREA 500 MG PO CAPS
1000.0000 mg | ORAL_CAPSULE | Freq: Every day | ORAL | Status: DC
  Administered 2020-02-15 – 2020-02-16 (×2): 1000 mg via ORAL
  Filled 2020-02-15 (×3): qty 2

## 2020-02-15 MED ORDER — LOSARTAN POTASSIUM 50 MG PO TABS: 100.0000 mg | ORAL_TABLET | Freq: Every evening | ORAL | Status: DC

## 2020-02-15 MED ORDER — LABETALOL HCL 5 MG/ML IV SOLN
10.0000 mg | INTRAVENOUS | Status: DC | PRN
  Administered 2020-02-19 (×2): 10 mg via INTRAVENOUS
  Administered 2020-02-19 (×2): 20 mg via INTRAVENOUS
  Filled 2020-02-15 (×3): qty 4

## 2020-02-15 NOTE — Progress Notes (Signed)
PT Cancellation Note  Patient Details Name: [name] MRN: [id number] DOB: 1946/09/29   Cancelled Treatment:    Reason Eval/Treat Not Completed: Patient at procedure or test/unavailable. Pt was off floor at Bone Marrow Biopsy, remains on bed rest and is currently minimal responsive from sedation from surgery. Acute PT to return as able, as appropriate when mobility orders are advanced.  Kittie Plater, PT, DPT Acute Rehabilitation Services Pager #: [phone] Office #: [phone]    Berline Lopes 02/15/2020, 10:01 AM

## 2020-02-15 NOTE — Progress Notes (Signed)
STROKE TEAM PROGRESS NOTE   INTERVAL HISTORY RN at bedside, no family at bedside. Pt drowsy sleepy, had BM biopsy this am and had a bath before I saw her. She was worn out. However, she was able to open eyes and answered all orientation questions correctly. MRI yesterday showed stable IVH but extensive CMB progressive from prior. Platelet trending down, however, WBC and Hb trending up sharply. On hydroxyurea.    Vitals:   02/15/20 0920 02/15/20 0928 02/15/20 0930 02/15/20 0934  BP: 134/77 131/79  130/71  Pulse: 76  71 70  Resp: 12  11   Temp:      TempSrc:      SpO2: 92%  92% 93%  Weight:      Height:        CBC:  Recent Labs  Lab 02/13/20 1953 02/15/20 0553  WBC 12.0* 24.4*  NEUTROABS  --  21.3*  HGB 17.7* 18.6*  HCT 55.5* 58.5*  MCV 92.2 92.4  PLT 990* 738*    Basic Metabolic Panel:  Recent Labs  Lab 02/13/20 1953 02/14/20 1550  NA 138 141  K 3.8 3.7  CL 105 105  CO2 22 23  GLUCOSE 133* 129*  BUN 13 18  CREATININE 0.93 0.94  CALCIUM 9.5 9.7   Lipid Panel:     Component Value Date/Time   CHOL 172 02/14/2020 0959   TRIG 46 02/14/2020 0959   HDL 71 02/14/2020 0959   CHOLHDL 2.4 02/14/2020 0959   VLDL 9 02/14/2020 0959   LDLCALC 92 02/14/2020 0959   HgbA1c:  Lab Results  Component Value Date   HGBA1C 6.1 (H) 02/14/2020   Urine Drug Screen: No results found for: LABOPIA, COCAINSCRNUR, LABBENZ, AMPHETMU, THCU, LABBARB  Alcohol Level No results found for: ETH  IMAGING past 24 hours MR ANGIO HEAD WO CONTRAST  Result Date: 02/14/2020 CLINICAL DATA:  Follow-up cerebral hemorrhage. EXAM: MRI HEAD WITHOUT CONTRAST MRA HEAD WITHOUT CONTRAST TECHNIQUE: Multiplanar, multiecho pulse sequences of the brain and surrounding structures were obtained without intravenous contrast. Angiographic images of the head were obtained using MRA technique without contrast. COMPARISON:  Head CT February 14, 2020 6 FINDINGS: MRI HEAD FINDINGS Brain: Redemonstrated right intraventricular  hemorrhage with extension into the third and fourth ventricle as well as the occipital horn of left lateral ventricle. The ventricular size remains stable. No midline shift. A loculated fluid collection measuring approximately 1 cm with hematocrit level is noted in the left parietal region at midline (series 11, image 23). Extensive confluent foci of T2 hyperintensity are seen within the white matter of the cerebral hemispheres, basal ganglia, thalami and pons, nonspecific. Scattered punctate foci of susceptibility artifact are seen the bilateral cerebral hemispheres predominantly in subcortical location, bilateral thalami, pons and cerebellar hemispheres, likely representing hemosiderin deposit. This appears progressed since prior MRI. Vascular: Normal flow voids. Skull and upper cervical spine: Normal marrow signal. Sinuses/Orbits: Mild mucosal thickening of the right maxillary sinus. The orbits are maintained. Other: None. MRA HEAD FINDINGS The visualized portions of the distal cervical and intracranial internal carotid arteries are widely patent with normal flow related enhancement. The bilateral anterior cerebral arteries and middle cerebral arteries are widely patent with antegrade flow without high-grade flow-limiting stenosis or proximal branch occlusion. No intracranial aneurysm within the anterior circulation. The vertebral arteries are widely patent with antegrade flow. The posterior inferior cerebral arteries are normal. Redemonstrated is a fenestration of the bilateral A1 segments. Vertebrobasilar junction and basilar artery are widely patent with antegrade flow  without evidence of basilar stenosis or aneurysm. Posterior cerebral arteries are normal bilaterally. No intracranial aneurysm within the posterior circulation. IMPRESSION: 1. Stable right intraventricular hemorrhage with extension into the third and fourth ventricle. Stable ventricular size. No evidence of hydrocephalus. 2. Extensive confluent  T2 hyperintensity within the white matter of the cerebral hemispheres, basal ganglia, thalami, and pons, nonspecific but may be related to chronic small vessel ischemic changes. 3. Multiple foci of susceptibility artifact throughout the brain parenchyma, progressed from prior MRI. These may be related to microangiopathy versus amyloid angiopathy. Electronically Signed   By: Pedro Earls M.D.   On: 02/14/2020 14:54   MR BRAIN WO CONTRAST  Result Date: 02/14/2020 CLINICAL DATA:  Follow-up cerebral hemorrhage. EXAM: MRI HEAD WITHOUT CONTRAST MRA HEAD WITHOUT CONTRAST TECHNIQUE: Multiplanar, multiecho pulse sequences of the brain and surrounding structures were obtained without intravenous contrast. Angiographic images of the head were obtained using MRA technique without contrast. COMPARISON:  Head CT February 14, 2020 6 FINDINGS: MRI HEAD FINDINGS Brain: Redemonstrated right intraventricular hemorrhage with extension into the third and fourth ventricle as well as the occipital horn of left lateral ventricle. The ventricular size remains stable. No midline shift. A loculated fluid collection measuring approximately 1 cm with hematocrit level is noted in the left parietal region at midline (series 11, image 23). Extensive confluent foci of T2 hyperintensity are seen within the white matter of the cerebral hemispheres, basal ganglia, thalami and pons, nonspecific. Scattered punctate foci of susceptibility artifact are seen the bilateral cerebral hemispheres predominantly in subcortical location, bilateral thalami, pons and cerebellar hemispheres, likely representing hemosiderin deposit. This appears progressed since prior MRI. Vascular: Normal flow voids. Skull and upper cervical spine: Normal marrow signal. Sinuses/Orbits: Mild mucosal thickening of the right maxillary sinus. The orbits are maintained. Other: None. MRA HEAD FINDINGS The visualized portions of the distal cervical and intracranial internal  carotid arteries are widely patent with normal flow related enhancement. The bilateral anterior cerebral arteries and middle cerebral arteries are widely patent with antegrade flow without high-grade flow-limiting stenosis or proximal branch occlusion. No intracranial aneurysm within the anterior circulation. The vertebral arteries are widely patent with antegrade flow. The posterior inferior cerebral arteries are normal. Redemonstrated is a fenestration of the bilateral A1 segments. Vertebrobasilar junction and basilar artery are widely patent with antegrade flow without evidence of basilar stenosis or aneurysm. Posterior cerebral arteries are normal bilaterally. No intracranial aneurysm within the posterior circulation. IMPRESSION: 1. Stable right intraventricular hemorrhage with extension into the third and fourth ventricle. Stable ventricular size. No evidence of hydrocephalus. 2. Extensive confluent T2 hyperintensity within the white matter of the cerebral hemispheres, basal ganglia, thalami, and pons, nonspecific but may be related to chronic small vessel ischemic changes. 3. Multiple foci of susceptibility artifact throughout the brain parenchyma, progressed from prior MRI. These may be related to microangiopathy versus amyloid angiopathy. Electronically Signed   By: Pedro Earls M.D.   On: 02/14/2020 14:54   DG CHEST PORT 1 VIEW  Result Date: 02/14/2020 CLINICAL DATA:  Status post PICC placement EXAM: PORTABLE CHEST 1 VIEW COMPARISON:  August 15, 2014 FINDINGS: The heart size and mediastinal contours are within normal limits. Aortic knob calcifications are seen. A right-sided central venous catheter seen with the tip at the superior cavoatrial junction. No pneumothorax. No pleural effusion. No acute osseous abnormality. IMPRESSION: Interval placement of right-sided central venous catheter with the tip at the superior cavoatrial junction. Electronically Signed   By: Kerby Moors  Avutu M.D.   On:  02/14/2020 13:09   ECHOCARDIOGRAM COMPLETE  Result Date: 02/14/2020    ECHOCARDIOGRAM REPORT   Patient Name:   TOCARRA SPIKES Date of Exam: 02/14/2020 Medical Rec #:  [id number]        Height:       62.0 in Accession #:    [id number]       Weight:       155.0 lb Date of Birth:  16-Jun-1946       BSA:          1.715 m Patient Age:    74 years         BP:           121/56 mmHg Patient Gender: F                HR:           90 bpm. Exam Location:  Inpatient Procedure: 2D Echo Indications:    stroke 434.91  History:        Patient has prior history of Echocardiogram examinations, most                 recent 08/16/2014. Risk Factors:Hypertension and Dyslipidemia.  Sonographer:    Johny Chess Referring Phys: [id number] Howard Pearley [place] IMPRESSIONS  1. Left ventricular ejection fraction, by estimation, is 65 to 70%. The left ventricle has normal function. The left ventricle has no regional wall motion abnormalities. Left ventricular diastolic parameters were normal.  2. Right ventricular systolic function is normal. The right ventricular size is normal. Tricuspid regurgitation signal is inadequate for assessing PA pressure.  3. The mitral valve is grossly normal. No evidence of mitral valve regurgitation. No evidence of mitral stenosis.  4. The aortic valve is tricuspid. Aortic valve regurgitation is not visualized. Mild aortic valve sclerosis is present, with no evidence of aortic valve stenosis.  5. The inferior vena cava is normal in size with greater than 50% respiratory variability, suggesting right atrial pressure of 3 mmHg. Conclusion(s)/Recommendation(s): No intracardiac source of embolism detected on this transthoracic study. A transesophageal echocardiogram is recommended to exclude cardiac source of embolism if clinically indicated. FINDINGS  Left Ventricle: Left ventricular ejection fraction, by estimation, is 65 to 70%. The left ventricle has normal function. The left ventricle has no regional wall motion  abnormalities. The left ventricular internal cavity size was normal in size. There is  no left ventricular hypertrophy. Left ventricular diastolic parameters were normal. Right Ventricle: The right ventricular size is normal. No increase in right ventricular wall thickness. Right ventricular systolic function is normal. Tricuspid regurgitation signal is inadequate for assessing PA pressure. Left Atrium: Left atrial size was normal in size. Right Atrium: Right atrial size was normal in size. Pericardium: There is no evidence of pericardial effusion. Presence of pericardial fat pad. Mitral Valve: The mitral valve is grossly normal. Mild mitral annular calcification. No evidence of mitral valve regurgitation. No evidence of mitral valve stenosis. Tricuspid Valve: The tricuspid valve is grossly normal. Tricuspid valve regurgitation is not demonstrated. No evidence of tricuspid stenosis. Aortic Valve: The aortic valve is tricuspid. Aortic valve regurgitation is not visualized. Mild aortic valve sclerosis is present, with no evidence of aortic valve stenosis. Pulmonic Valve: The pulmonic valve was grossly normal. Pulmonic valve regurgitation is not visualized. No evidence of pulmonic stenosis. Aorta: The aortic root is normal in size and structure. Venous: The inferior vena cava is normal in size with greater than 50% respiratory variability, suggesting  right atrial pressure of 3 mmHg. IAS/Shunts: The atrial septum is grossly normal.  LEFT VENTRICLE PLAX 2D LVIDd:         4.40 cm  Diastology LVIDs:         2.70 cm  LV e' lateral:   9.46 cm/s LV PW:         0.90 cm  LV E/e' lateral: 8.6 LV IVS:        0.70 cm  LV e' medial:    8.05 cm/s LVOT diam:     2.00 cm  LV E/e' medial:  10.1 LV SV:         74 LV SV Index:   43 LVOT Area:     3.14 cm  RIGHT VENTRICLE RV S prime:     17.10 cm/s TAPSE (M-mode): 2.5 cm LEFT ATRIUM             Index       RIGHT ATRIUM           Index LA diam:        3.40 cm 1.98 cm/m  RA Area:      11.40 cm LA Vol (A2C):   26.9 ml 15.68 ml/m RA Volume:   23.90 ml  13.93 ml/m LA Vol (A4C):   38.5 ml 22.44 ml/m LA Biplane Vol: 33.5 ml 19.53 ml/m  AORTIC VALVE LVOT Vmax:   111.00 cm/s LVOT Vmean:  70.600 cm/s LVOT VTI:    0.234 m MITRAL VALVE MV Area (PHT): 3.12 cm    SHUNTS MV Decel Time: 243 msec    Systemic VTI:  0.23 m MV E velocity: 81.00 cm/s  Systemic Diam: 2.00 cm MV A velocity: 99.80 cm/s MV E/A ratio:  0.81 Eleonore Chiquito MD Electronically signed by Eleonore Chiquito MD Signature Date/Time: 02/14/2020/4:33:15 PM    Final     PHYSICAL EXAM  Temp:  [97.2 F (36.2 C)-99 F (37.2 C)] 99 F (37.2 C) (04/06 0800) Pulse Rate:  [63-97] 70 (04/06 0934) Resp:  [11-25] 11 (04/06 0930) BP: (103-152)/(7-80) 130/71 (04/06 0934) SpO2:  [92 %-100 %] 93 % (04/06 0934) Weight:  [66.3 kg-72.2 kg] 66.3 kg (04/06 0500)  General - Well nourished, well developed, drowsy and sleepy.  Ophthalmologic - fundi not visualized due to noncooperation.  Cardiovascular - Regular rhythm and rate.  Mental Status -  Drowsy sleepy but arousable and fully orientation to time, place, and person were intact. Mild nuchal rigidity with pain on neck flexion Language including expression, naming, repetition, comprehension was assessed and found intact. Fund of Knowledge was assessed and was intact.  Cranial Nerves II - XII - II - Visual field intact OU. III, IV, VI - Extraocular movements intact. V - Facial sensation intact bilaterally. VII - mild left facial droop. VIII - Hearing & vestibular intact bilaterally. X - Palate elevates symmetrically. XI - Chin turning & shoulder shrug intact bilaterally. XII - Tongue protrusion intact.  Motor Strength - The patient's strength was normal in all extremities and pronator drift was absent.  Bulk was normal and fasciculations were absent.   Motor Tone - Muscle tone was assessed at the neck and appendages and was normal.  Reflexes - The patient's reflexes were  symmetrical in all extremities and she had no pathological reflexes.  Sensory - Light touch, temperature/pinprick were assessed and were symmetrical.    Coordination - The patient had normal movements in the hands with no ataxia or dysmetria.  Tremor was absent.  Gait and Station -  deferred.   ASSESSMENT/PLAN Ms. BRENDA-LEE SALMON is a 74 y.o. female with history of HTN presenting with severe persistent HA accompanied by nausea and vomiting.   IVH - right lateral ventricle IVH secondary to ? MDS vs. CAA vs. HTN  CT head 4/5 0032 R lateral IVH. Severe chronic ischemic microangiopathy.  CT head 4/5 0857 no sign change  MRI Stable IVH, severe leukoaraiosis. Multiple foci throughout progressed from previous MRI d/t microangiopathy vs amyloid.  MRA unremarkable   2D Echo EF 65-70%. No source of embolus   LDL 92   HgbA1c 6.1   SCDs for VTE prophylaxis  aspirin 81 mg daily prior to admission, now on No antithrombotic given IVH  Therapy recommendations:  pending   Disposition:  pending   Pancytosis - ? MDS  WBC 12->24.4, Hgb 17.1->18.6, PLT 990->738  Dr. Ron Agee on board   Bone marrow bx done, results pending   on hydrea  Phlebotomy today s/p pheresis yesterday  erythropoietin level pending   JAK2 pending    Hypertension  Home meds:  losartan 100, amlodipine 5  Resume home losartan and amlodipine  . SBP goal < 140  Now Off Cleviprex  Stable . 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 Multiple foci throughout progressed from previous MRI d/t microangiopathy vs amyloid.  As per husband, pt has some anomia as baseline  BP goal < 140  Avoid antiplatelet or anticoagulation  Hyperlipidemia  Home meds:  lipitor 10  LDL 92  Statin held in setting of acute ICH  Consider  continuation of statin at discharge  Other Stroke Risk Factors  Advanced age  Former Cigarette smoker  Overweight, Body mass index is 26.73 kg/m., recommend weight loss, diet and exercise as appropriate   Family hx stroke (mother, father, and multiple other family members)  Other Active Problems  Hepatitis C  Hypothyroid on synthroid. TSH WNL - resume synthroid   Hospital day # 1  This patient is critically ill due to IVH, pancytosis due to ? MDS, likely CAA and at significant risk of neurological worsening, death form hydrocephalus, recurrent IVH, lobar hemorrhage and sizure. This patient's care requires constant monitoring of vital signs, hemodynamics, respiratory and cardiac monitoring, review of multiple databases, neurological assessment, discussion with family, other specialists and medical decision making of high complexity. I spent 35 minutes of neurocritical care time in the care of this patient.   Rosalin Hawking, MD PhD Stroke Neurology 02/15/2020 10:33 AM  To contact Stroke Continuity provider, please refer to [email or web address] After hours, contact General Neurology

## 2020-02-15 NOTE — Progress Notes (Signed)
SLP Cancellation Note  Patient Details Name: [name] MRN: [id number] DOB: 06/03/1946   Cancelled treatment:       Reason Eval/Treat Not Completed: Patient at procedure or test/unavailable. Will f/u for cognitive-linguistic evaluation as able.    Osie Bond., M.A. [place] [facility] Pager [phone] Office [phone]  02/15/2020, 9:33 AM

## 2020-02-15 NOTE — Progress Notes (Signed)
[place]  [phone] [phone]     ID: SAHANA BOYLAND DOB: 01/14/1946  MR#: [id number]  VPX#:[id number]  Patient Care Team: Cari Caraway, MD as PCP - General (Family Medicine) [name], NP OTHER MD:  CHIEF COMPLAINT: Follow-up intracranial bleed, polycythemia  CURRENT TREATMENT: Pheresis, Hydrea, therapeutic phlebotomy   HISTORY OF CURRENT ILLNESS: Ms. Zieske has a history of hypertension followed by Dr. Theadore Nan.  For the last few days she has had headache and she took her blood pressure and it was as high as 168/102 which is unusual for her.  As this was not improving she presented to the emergency room last night 02/13/2020. Dr Roxanne Mins obtained a basic lab work and a head CT.  The head CT without contrast showed an acute intraventricular hemorrhage in the right lateral ventricle, with no parenchymal component.  The lab work showed a white cell count of 12.0, hemoglobin 17.7, and platelets 990,000.  We were consulted for further evaluation and treatment.  INTERVAL HISTORY: Drena is resting quietly in bed.  She opens her eyes and answer some brief questions.  Otherwise she is somewhat somnolent.  Her husband, [name], is at the bedside.  REVIEW OF SYSTEMS: Shereta had a bone marrow biopsy performed earlier today.  She tolerated the procedure well.  She offers no complaints this afternoon.  PAST MEDICAL HISTORY: Past Medical History:  Diagnosis Date  . Hepatitis C   . Hyperlipidemia   . Hypertension   . Osteoporosis     PAST SURGICAL HISTORY: No past surgical history on file.  FAMILY HISTORY No family history on file. The patient's father died at age 77 and the patient's mother at age 46 both from heart disease.  The patient has 1 sister, no brothers.  There is no family history of blood problems and no family history of cancer to the patient's knowledge  GYNECOLOGIC HISTORY:  No LMP recorded. Patient is  postmenopausal. Menarche: 74 years old Age at first live birth: 74 years old [place] P 2 LMP 70 HRT yes, a few years  Hysterectomy?  Salpingo-oophorectomy?  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  Her husband [name] ran a business but is now retired.  Their son [name] is a Adult nurse.  Their son Aaron Edelman is an Pharmacologist.  The patient has 1 grandson and 4 step grandchildren.  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    ADVANCED DIRECTIVES: In the absence of any documents to the contrary the patient's husband is her healthcare power of attorney   HEALTH MAINTENANCE: Social History   Tobacco Use  . Smoking status: Former Smoker  Substance Use Topics  . Alcohol use: No  . Drug use: No     Colonoscopy: [facility]  PAP: [place]  Bone density:  Mammogram: Due   Allergies  Allergen Reactions  . Erythromycin Itching and Other (See Comments)    Severe stomach pains, diarrhea    Current Facility-Administered Medications  Medication Dose Route Frequency Provider Last Rate Last Admin  .  stroke: mapping our early stages of recovery book   Does not apply Once Greta Doom, MD      . 0.9 %  sodium chloride infusion   Intravenous Continuous Rosalin Hawking, MD 50 mL/hr at 02/15/20 1300 Rate Verify at 02/15/20 1300  . acetaminophen (TYLENOL) tablet 650 mg  650 mg Oral Q6H PRN Rosalin Hawking, MD   650 mg at 02/15/20 0136   Or  . acetaminophen (  TYLENOL) 160 MG/5ML solution 650 mg  650 mg Per Tube Q6H PRN Rosalin Hawking, MD       Or  . acetaminophen (TYLENOL) suppository 650 mg  650 mg Rectal Q6H PRN Rosalin Hawking, MD      . acetaminophen (TYLENOL) tablet 650 mg  650 mg Oral Q4H PRN Magrinat, Virgie Dad, MD      . amLODipine (NORVASC) tablet 5 mg  5 mg Oral Daily Rosalin Hawking, MD   5 mg at 02/15/20 1151  . butalbital-acetaminophen-caffeine (FIORICET) 50-325-40 MG per tablet 1 tablet  1 tablet Oral Q8H PRN Rosalin Hawking, MD   1 tablet at 02/15/20 0524  . chlorhexidine (PERIDEX) 0.12  % solution 15 mL  15 mL Mouth Rinse BID Greta Doom, MD   15 mL at 02/15/20 1121  . Chlorhexidine Gluconate Cloth 2 % PADS 6 each  6 each Topical Q0600 Greta Doom, MD   6 each at 02/15/20 1100  . citrate dextrose (ACD-A anticoagulant) solution 1,000 mL  1,000 mL Other Continuous Magrinat, Virgie Dad, MD   1,000 mL at 02/14/20 1728  . diphenhydrAMINE (BENADRYL) capsule 25 mg  25 mg Oral Q6H PRN Magrinat, Virgie Dad, MD      . fentaNYL (SUBLIMAZE) 100 MCG/2ML injection           . heparin injection 1,000 Units  1,000 Units Intracatheter Once Magrinat, Virgie Dad, MD      . hydroxyurea (HYDREA) capsule 1,000 mg  1,000 mg Oral Daily Magrinat, Virgie Dad, MD   1,000 mg at 02/15/20 1233  . labetalol (NORMODYNE) injection 10-20 mg  10-20 mg Intravenous Q2H PRN Rosalin Hawking, MD      . Derrill Memo ON 02/16/2020] levothyroxine (SYNTHROID) tablet 125 mcg  125 mcg Oral QAC breakfast Rosalin Hawking, MD      . losartan (COZAAR) tablet 50 mg  50 mg Oral BID Rosalin Hawking, MD   50 mg at 02/15/20 1152  . midazolam (VERSED) 2 MG/2ML injection           . ondansetron (ZOFRAN) injection 4 mg  4 mg Intravenous Q4H PRN Greta Doom, MD   4 mg at 02/15/20 1220  . pantoprazole (PROTONIX) EC tablet 40 mg  40 mg Oral Daily Rosalin Hawking, MD   40 mg at 02/15/20 1151  . senna-docusate (Senokot-S) tablet 1 tablet  1 tablet Oral BID Greta Doom, MD   1 tablet at 02/15/20 1121  . sodium chloride 0.9 % bolus 1,000 mL  1,000 mL Intravenous Once Delora Fuel, MD      . sodium chloride flush (NS) 0.9 % injection 10-40 mL  10-40 mL Intracatheter Q12H Rosalin Hawking, MD   10 mL at 02/15/20 1122  . sodium chloride flush (NS) 0.9 % injection 10-40 mL  10-40 mL Intracatheter PRN Rosalin Hawking, MD        OBJECTIVE: Middle-aged white woman examined in bed  Vitals:   02/15/20 1300 02/15/20 1315  BP: (!) 139/58 129/64  Pulse:  67  Resp: 12 12  Temp:    SpO2:  94%     Body mass index is 26.73 kg/m.   Wt Readings  from Last 3 Encounters:  02/15/20 66.3 kg  08/15/14 67.6 kg      LAB RESULTS:  CMP     Component Value Date/Time   NA 141 02/14/2020 1550   K 3.7 02/14/2020 1550   CL 105 02/14/2020 1550   CO2 23 02/14/2020 1550   GLUCOSE 129 (  H) 02/14/2020 1550   BUN 18 02/14/2020 1550   CREATININE 0.94 02/14/2020 1550   CALCIUM 9.7 02/14/2020 1550   PROT 6.9 08/15/2014 1838   ALBUMIN 4.1 08/15/2014 1838   AST 18 08/15/2014 1838   ALT 21 08/15/2014 1838   ALKPHOS 70 08/15/2014 1838   BILITOT 0.9 08/15/2014 1838   GFRNONAA >60 02/14/2020 1550   GFRAA >60 02/14/2020 1550    No results found for: TOTALPROTELP, ALBUMINELP, A1GS, A2GS, BETS, BETA2SER, GAMS, MSPIKE, SPEI  No results found for: KPAFRELGTCHN, LAMBDASER, KAPLAMBRATIO  Lab Results  Component Value Date   WBC 24.4 (H) 02/15/2020   NEUTROABS 21.3 (H) 02/15/2020   HGB 18.6 (H) 02/15/2020   HCT 58.5 (H) 02/15/2020   MCV 92.4 02/15/2020   PLT 738 (H) 02/15/2020    '@LASTCHEMISTRY' @  No results found for: LABCA2  No components found for: KCMKLK917  Recent Labs  Lab 02/14/20 0028  INR 1.1    No results found for: LABCA2  No results found for: HXT056  No results found for: PVX480  No results found for: XKP537  No results found for: CA2729  No components found for: HGQUANT  No results found for: CEA1 / No results found for: CEA1   No results found for: AFPTUMOR  No results found for: CHROMOGRNA  No results found for: PSA1  Admission on 02/13/2020  Component Date Value Ref Range Status  . Sodium 02/13/2020 138  135 - 145 mmol/L Final  . Potassium 02/13/2020 3.8  3.5 - 5.1 mmol/L Final  . Chloride 02/13/2020 105  98 - 111 mmol/L Final  . CO2 02/13/2020 22  22 - 32 mmol/L Final  . Glucose, Bld 02/13/2020 133* 70 - 99 mg/dL Final   Glucose reference range applies only to samples taken after fasting for at least 8 hours.  . BUN 02/13/2020 13  8 - 23 mg/dL Final  . Creatinine, Ser 02/13/2020 0.93  0.44 -  1.00 mg/dL Final  . Calcium 02/13/2020 9.5  8.9 - 10.3 mg/dL Final  . GFR calc non Af Amer 02/13/2020 >60  >60 mL/min Final  . GFR calc Af Amer 02/13/2020 >60  >60 mL/min Final  . Anion gap 02/13/2020 11  5 - 15 Final   Performed at [facility], [place] [address] [place], [place] [address]  . WBC 02/13/2020 12.0* 4.0 - 10.5 K/uL Final  . RBC 02/13/2020 6.02* 3.87 - 5.11 MIL/uL Final  . Hemoglobin 02/13/2020 17.7* 12.0 - 15.0 g/dL Final  . HCT 02/13/2020 55.5* 36.0 - 46.0 % Final  . MCV 02/13/2020 92.2  80.0 - 100.0 fL Final  . MCH 02/13/2020 29.4  26.0 - 34.0 pg Final  . MCHC 02/13/2020 31.9  30.0 - 36.0 g/dL Final  . RDW 02/13/2020 14.3  11.5 - 15.5 % Final  . Platelets 02/13/2020 990* 150 - 400 K/uL Corrected   Comment: This critical result has verified and been called to C.CHIRSO,RN by Susy Manor on 04 04 2021 at 2032, and has been read back.  REPEATED TO VERIFY CORRECTED ON 04/05 AT 1113: PREVIOUSLY REPORTED AS 990 This critical result has verified and been called to C.CHIRSO,RN by Susy Manor on 04 04 2021 at 2032, and has been read back.    Marland Kitchen nRBC 02/13/2020 0.0  0.0 - 0.2 % Final   Performed at [facility], [place] [address], [place], [place] [address]  . Prothrombin Time 02/14/2020 13.6  11.4 - 15.2 seconds Final  . INR  02/14/2020 1.1  0.8 - 1.2 Final   Comment: (NOTE) INR goal varies based on device and disease states. Performed at [facility], [place] [address], [place], [place] [address]   . aPTT 02/14/2020 40* 24 - 36 seconds Final   Comment:        IF BASELINE aPTT IS ELEVATED, SUGGEST PATIENT RISK ASSESSMENT BE USED TO DETERMINE APPROPRIATE ANTICOAGULANT THERAPY. Performed at [facility], [place] [address], [place], [place] [address]   . SARS Coronavirus 2 02/14/2020 NEGATIVE  NEGATIVE Final   Comment: (NOTE) SARS-CoV-2 target nucleic acids are NOT DETECTED. The SARS-CoV-2 RNA is generally detectable in upper and lower respiratory  specimens during the acute phase of infection. Negative results do not preclude SARS-CoV-2 infection, do not rule out co-infections with other pathogens, and should not be used as the sole basis for treatment or other patient management decisions. Negative results must be combined with clinical observations, patient history, and epidemiological information. The expected result is Negative. Fact Sheet for Patients: SugarRoll.be Fact Sheet for Healthcare Providers: https://www.woods-mathews.com/ This test is not yet approved or cleared by the [place] FDA and  has been authorized for detection and/or diagnosis of SARS-CoV-2 by FDA under an Emergency Use Authorization (EUA). This EUA will remain  in effect (meaning this test can be used) for the duration of the COVID-19 declaration under Section 56                          4(b)(1) of the Act, 21 U.S.C. section 360bbb-3(b)(1), unless the authorization is terminated or revoked sooner. Performed at [facility], [place] [address] [place], [place] [address]   . SARS Coronavirus 2 by RT PCR 02/14/2020 NEGATIVE  NEGATIVE Final   Comment: (NOTE) SARS-CoV-2 target nucleic acids are NOT DETECTED. The SARS-CoV-2 RNA is generally detectable in upper respiratoy specimens during the acute phase of infection. The lowest concentration of SARS-CoV-2 viral copies this assay can detect is 131 copies/mL. A negative result does not preclude SARS-Cov-2 infection and should not be used as the sole basis for treatment or other patient management decisions. A negative result may occur with  improper specimen collection/handling, submission of specimen other than nasopharyngeal swab, presence of viral mutation(s) within the areas targeted by this assay, and inadequate number of viral copies (<131 copies/mL). A negative result must be combined with clinical observations, patient history, and epidemiological  information. The expected result is Negative. Fact Sheet for Patients:  PinkCheek.be Fact Sheet for Healthcare Providers:  GravelBags.it This test is not yet ap                          proved or cleared by the [place] FDA and  has been authorized for detection and/or diagnosis of SARS-CoV-2 by FDA under an Emergency Use Authorization (EUA). This EUA will remain  in effect (meaning this test can be used) for the duration of the COVID-19 declaration under Section 564(b)(1) of the Act, 21 U.S.C. section 360bbb-3(b)(1), unless the authorization is terminated or revoked sooner.   . Influenza A by PCR 02/14/2020 NEGATIVE  NEGATIVE Final  . Influenza B by PCR 02/14/2020 NEGATIVE  NEGATIVE Final   Comment: (NOTE) The Xpert Xpress SARS-CoV-2/FLU/RSV assay is intended as an aid in  the diagnosis of influenza from Nasopharyngeal swab specimens and  should not be used as a sole basis for treatment. Nasal washings and  aspirates are unacceptable for Xpert Xpress SARS-CoV-2/FLU/RSV  testing. Fact Sheet for Patients: PinkCheek.be Fact Sheet for Healthcare Providers: GravelBags.it This test is not yet approved or cleared by the [place] FDA and  has been authorized for detection and/or diagnosis of SARS-CoV-2 by  FDA under an Emergency Use Authorization (EUA). This EUA will remain  in effect (meaning this test can be used) for the duration of the  Covid-19 declaration under Section 564(b)(1) of the Act, 21  U.S.C. section 360bbb-3(b)(1), unless the authorization is  terminated or revoked. Performed at [facility], [place] [address] [place], [place] [address]   . MRSA by PCR 02/14/2020 NEGATIVE  NEGATIVE Final   Comment:        The GeneXpert MRSA Assay (FDA approved for NASAL specimens only), is one component of a comprehensive MRSA colonization surveillance  program. It is not intended to diagnose MRSA infection nor to guide or monitor treatment for MRSA infections. Performed at [facility], [place] [address], [place], [place] [address]   . Uric Acid, Serum 02/14/2020 5.4  2.5 - 7.1 mg/dL Final   Performed at [facility], [place] [address] [place], [place] [address]  . Ferritin 02/14/2020 75  11 - 307 ng/mL Final   Performed at [facility], [place] [address] [place], [place] [address]  . Iron 02/14/2020 73  28 - 170 ug/dL Final  . TIBC 02/14/2020 358  250 - 450 ug/dL Final  . Saturation Ratios 02/14/2020 20  10.4 - 31.8 % Final  . UIBC 02/14/2020 285  ug/dL Final   Performed at [facility], [place] [address], [place], [place] [address]  . Weight 02/14/2020 2,480  oz Final  . Height 02/14/2020 62  in Final  . BP 02/14/2020 133/70  mmHg Final  . Hgb A1c MFr Bld 02/14/2020 6.1* 4.8 - 5.6 % Final   Comment: (NOTE) Pre diabetes:          5.7%-6.4% Diabetes:              >6.4% Glycemic control for   <7.0% adults with diabetes   . Mean Plasma Glucose 02/14/2020 128.37  mg/dL Final   Performed at [facility] [address], [place], [place] [address]  . Vitamin B-12 02/14/2020 248  180 - 914 pg/mL Final   Comment: (NOTE) This assay is not validated for testing neonatal or myeloproliferative syndrome specimens for Vitamin B12 levels. Performed at [facility], [place] [address] [place], [place] [address]   . TSH 02/14/2020 1.581  0.350 - 4.500 uIU/mL Final   Comment: Performed by a 3rd Generation assay with a functional sensitivity of <=0.01 uIU/mL. Performed at [facility], [place] [address] [place], [place] [address]   . Path Review 02/13/2020 Thrombocytosis.  Reactive appearing lymphocytes.   Final   Comment: Reviewed by Lennox Solders. Lyndon Code, M.D. 02/14/2020. Performed at [facility], [place] [address] [place], [place] [address]   . Cholesterol 02/14/2020 172  0 - 200 mg/dL Final  . Triglycerides  02/14/2020 46  <150 mg/dL Final  . HDL 02/14/2020 71  >40 mg/dL Final  . Total CHOL/HDL Ratio 02/14/2020 2.4  RATIO Final  . VLDL 02/14/2020 9  0 - 40 mg/dL Final  . LDL Cholesterol 02/14/2020 92  0 - 99 mg/dL Final   Comment:        Total Cholesterol/HDL:CHD Risk Coronary Heart Disease Risk Table  Men   Women  1/2 Average Risk   3.4   3.3  Average Risk       5.0   4.4  2 X Average Risk   9.6   7.1  3 X Average Risk  23.4   11.0        Use the calculated Patient Ratio above and the CHD Risk Table to determine the patient's CHD Risk.        ATP III CLASSIFICATION (LDL):  <100     mg/dL   Optimal  100-129  mg/dL   Near or Above                    Optimal  130-159  mg/dL   Borderline  160-189  mg/dL   High  >190     mg/dL   Very High Performed at [facility] [address] [place], [place] [address]   . Sodium 02/14/2020 141  135 - 145 mmol/L Final  . Potassium 02/14/2020 3.7  3.5 - 5.1 mmol/L Final  . Chloride 02/14/2020 105  98 - 111 mmol/L Final  . CO2 02/14/2020 23  22 - 32 mmol/L Final  . Glucose, Bld 02/14/2020 129* 70 - 99 mg/dL Final   Glucose reference range applies only to samples taken after fasting for at least 8 hours.  . BUN 02/14/2020 18  8 - 23 mg/dL Final  . Creatinine, Ser 02/14/2020 0.94  0.44 - 1.00 mg/dL Final  . Calcium 02/14/2020 9.7  8.9 - 10.3 mg/dL Final  . GFR calc non Af Amer 02/14/2020 >60  >60 mL/min Final  . GFR calc Af Amer 02/14/2020 >60  >60 mL/min Final  . Anion gap 02/14/2020 13  5 - 15 Final   Performed at [facility], [place] [address], [place], [place] [address]  . WBC 02/15/2020 24.4* 4.0 - 10.5 K/uL Final  . RBC 02/15/2020 6.33* 3.87 - 5.11 MIL/uL Final  . Hemoglobin 02/15/2020 18.6* 12.0 - 15.0 g/dL Final   REPEATED TO VERIFY  . HCT 02/15/2020 58.5* 36.0 - 46.0 % Final   REPEATED TO VERIFY  . MCV 02/15/2020 92.4  80.0 - 100.0 fL Final  . MCH 02/15/2020 29.4  26.0 - 34.0 pg Final  . MCHC 02/15/2020  31.8  30.0 - 36.0 g/dL Final  . RDW 02/15/2020 15.1  11.5 - 15.5 % Final  . Platelets 02/15/2020 738* 150 - 400 K/uL Final  . nRBC 02/15/2020 0.0  0.0 - 0.2 % Final  . Neutrophils Relative % 02/15/2020 88  % Final  . Neutro Abs 02/15/2020 21.3* 1.7 - 7.7 K/uL Final  . Lymphocytes Relative 02/15/2020 5  % Final  . Lymphs Abs 02/15/2020 1.2  0.7 - 4.0 K/uL Final  . Monocytes Relative 02/15/2020 6  % Final  . Monocytes Absolute 02/15/2020 1.5* 0.1 - 1.0 K/uL Final  . Eosinophils Relative 02/15/2020 0  % Final  . Eosinophils Absolute 02/15/2020 0.0  0.0 - 0.5 K/uL Final  . Basophils Relative 02/15/2020 0  % Final  . Basophils Absolute 02/15/2020 0.1  0.0 - 0.1 K/uL Final  . Immature Granulocytes 02/15/2020 1  % Final  . Abs Immature Granulocytes 02/15/2020 0.27* 0.00 - 0.07 K/uL Final   Performed at [facility], [place] [address] [place], [place] [address]    (this displays the last labs from the last 3 days)  No results found for: TOTALPROTELP, ALBUMINELP, A1GS, A2GS, BETS, BETA2SER, [place], MSPIKE,  SPEI (this displays SPEP labs)  No results found for: KPAFRELGTCHN, LAMBDASER, KAPLAMBRATIO (kappa/lambda light chains)  No results found for: HGBA, HGBA2QUANT, HGBFQUANT, HGBSQUAN (Hemoglobinopathy evaluation)   No results found for: LDH  Lab Results  Component Value Date   IRON 73 02/14/2020   TIBC 358 02/14/2020   IRONPCTSAT 20 02/14/2020   (Iron and TIBC)  Lab Results  Component Value Date   FERRITIN 75 02/14/2020    Urinalysis    Component Value Date/Time   COLORURINE YELLOW 08/15/2014 2129   APPEARANCEUR CLEAR 08/15/2014 2129   LABSPEC 1.013 08/15/2014 2129   PHURINE 6.0 08/15/2014 2129   GLUCOSEU NEGATIVE 08/15/2014 2129   HGBUR NEGATIVE 08/15/2014 2129   BILIRUBINUR NEGATIVE 08/15/2014 2129   KETONESUR 15 (A) 08/15/2014 2129   PROTEINUR NEGATIVE 08/15/2014 2129   UROBILINOGEN 0.2 08/15/2014 2129   NITRITE NEGATIVE 08/15/2014 2129   LEUKOCYTESUR NEGATIVE  08/15/2014 2129     STUDIES: CT HEAD WO CONTRAST  Result Date: 02/14/2020 CLINICAL DATA:  Follow-up hemorrhage EXAM: CT HEAD WITHOUT CONTRAST TECHNIQUE: Contiguous axial images were obtained from the base of the skull through the vertex without intravenous contrast. COMPARISON:  Earlier same day, 8 hours ago. FINDINGS: Brain: Intraventricular hemorrhage within the right lateral ventricle persists, approximately the same volume without evidence of ongoing or additional bleeding. Small amount of blood present within the fourth ventricle and left lateral ventricle. Small amount of subarachnoid blood visible within a left parietal sulcus. No overall change in ventricular size. Widespread chronic microangiopathic change of the white matter as seen previously. Old small vessel infarction in the right basal ganglia. No sign of acute infarction. I do not identify an intraparenchymal source. Vascular: There is atherosclerotic calcification of the major vessels at the base of the brain. Skull: Negative Sinuses/Orbits: Clear/normal Other: None IMPRESSION: No significant change since 8 hours ago. Intraventricular hemorrhage primarily within the right lateral ventricle. No evidence of ongoing bleeding. Small amount of blood dependent within the left lateral ventricle and within the fourth ventricle. Ventricular size is stable. Small amount of subarachnoid blood evident in a left parietal sulcus. Electronically Signed   By: Nelson Chimes M.D.   On: 02/14/2020 08:57   CT Head Wo Contrast  Result Date: 02/14/2020 CLINICAL DATA:  Acute onset headache EXAM: CT HEAD WITHOUT CONTRAST TECHNIQUE: Contiguous axial images were obtained from the base of the skull through the vertex without intravenous contrast. COMPARISON:  None. FINDINGS: Brain: There is acute hemorrhage within the right lateral ventricle. No definite intraparenchymal component is identified. There is severe chronic white matter disease, most commonly indicating  chronic ischemic microangiopathy. Volume of CSF spaces is normal. No midline shift or other mass effect Vascular: No abnormal hyperdensity of the major intracranial arteries or dural venous sinuses. No intracranial atherosclerosis. Skull: The visualized skull base, calvarium and extracranial soft tissues are normal. Sinuses/Orbits: No fluid levels or advanced mucosal thickening of the visualized paranasal sinuses. No mastoid or middle ear effusion. The orbits are normal. IMPRESSION: 1. Acute intraventricular hemorrhage within the right lateral ventricle. No definite parenchymal component identified. This is an unusual pattern, but is perhaps less unlikely in the setting of this patient's abnormal coagulation status. 2. Severe chronic ischemic microangiopathy. Critical Value/emergent results were called by telephone at the time of interpretation on 02/14/2020 at 12:31 am to provider DAVID [facility] , who verbally acknowledged these results. Electronically Signed   By: Ulyses Jarred M.D.   On: 02/14/2020 00:32   MR ANGIO HEAD WO CONTRAST  Result Date: 02/14/2020 CLINICAL DATA:  Follow-up cerebral hemorrhage. EXAM: MRI HEAD WITHOUT CONTRAST MRA HEAD WITHOUT CONTRAST TECHNIQUE: Multiplanar, multiecho pulse sequences of the brain and surrounding structures were obtained without intravenous contrast. Angiographic images of the head were obtained using MRA technique without contrast. COMPARISON:  Head CT February 14, 2020 6 FINDINGS: MRI HEAD FINDINGS Brain: Redemonstrated right intraventricular hemorrhage with extension into the third and fourth ventricle as well as the occipital horn of left lateral ventricle. The ventricular size remains stable. No midline shift. A loculated fluid collection measuring approximately 1 cm with hematocrit level is noted in the left parietal region at midline (series 11, image 23). Extensive confluent foci of T2 hyperintensity are seen within the white matter of the cerebral hemispheres, basal  ganglia, thalami and pons, nonspecific. Scattered punctate foci of susceptibility artifact are seen the bilateral cerebral hemispheres predominantly in subcortical location, bilateral thalami, pons and cerebellar hemispheres, likely representing hemosiderin deposit. This appears progressed since prior MRI. Vascular: Normal flow voids. Skull and upper cervical spine: Normal marrow signal. Sinuses/Orbits: Mild mucosal thickening of the right maxillary sinus. The orbits are maintained. Other: None. MRA HEAD FINDINGS The visualized portions of the distal cervical and intracranial internal carotid arteries are widely patent with normal flow related enhancement. The bilateral anterior cerebral arteries and middle cerebral arteries are widely patent with antegrade flow without high-grade flow-limiting stenosis or proximal branch occlusion. No intracranial aneurysm within the anterior circulation. The vertebral arteries are widely patent with antegrade flow. The posterior inferior cerebral arteries are normal. Redemonstrated is a fenestration of the bilateral A1 segments. Vertebrobasilar junction and basilar artery are widely patent with antegrade flow without evidence of basilar stenosis or aneurysm. Posterior cerebral arteries are normal bilaterally. No intracranial aneurysm within the posterior circulation. IMPRESSION: 1. Stable right intraventricular hemorrhage with extension into the third and fourth ventricle. Stable ventricular size. No evidence of hydrocephalus. 2. Extensive confluent T2 hyperintensity within the white matter of the cerebral hemispheres, basal ganglia, thalami, and pons, nonspecific but may be related to chronic small vessel ischemic changes. 3. Multiple foci of susceptibility artifact throughout the brain parenchyma, progressed from prior MRI. These may be related to microangiopathy versus amyloid angiopathy. Electronically Signed   By: Pedro Earls M.D.   On: 02/14/2020 14:54    MR BRAIN WO CONTRAST  Result Date: 02/14/2020 CLINICAL DATA:  Follow-up cerebral hemorrhage. EXAM: MRI HEAD WITHOUT CONTRAST MRA HEAD WITHOUT CONTRAST TECHNIQUE: Multiplanar, multiecho pulse sequences of the brain and surrounding structures were obtained without intravenous contrast. Angiographic images of the head were obtained using MRA technique without contrast. COMPARISON:  Head CT February 14, 2020 6 FINDINGS: MRI HEAD FINDINGS Brain: Redemonstrated right intraventricular hemorrhage with extension into the third and fourth ventricle as well as the occipital horn of left lateral ventricle. The ventricular size remains stable. No midline shift. A loculated fluid collection measuring approximately 1 cm with hematocrit level is noted in the left parietal region at midline (series 11, image 23). Extensive confluent foci of T2 hyperintensity are seen within the white matter of the cerebral hemispheres, basal ganglia, thalami and pons, nonspecific. Scattered punctate foci of susceptibility artifact are seen the bilateral cerebral hemispheres predominantly in subcortical location, bilateral thalami, pons and cerebellar hemispheres, likely representing hemosiderin deposit. This appears progressed since prior MRI. Vascular: Normal flow voids. Skull and upper cervical spine: Normal marrow signal. Sinuses/Orbits: Mild mucosal thickening of the right maxillary sinus. The orbits are maintained. Other: None. MRA HEAD FINDINGS The visualized  portions of the distal cervical and intracranial internal carotid arteries are widely patent with normal flow related enhancement. The bilateral anterior cerebral arteries and middle cerebral arteries are widely patent with antegrade flow without high-grade flow-limiting stenosis or proximal branch occlusion. No intracranial aneurysm within the anterior circulation. The vertebral arteries are widely patent with antegrade flow. The posterior inferior cerebral arteries are normal.  Redemonstrated is a fenestration of the bilateral A1 segments. Vertebrobasilar junction and basilar artery are widely patent with antegrade flow without evidence of basilar stenosis or aneurysm. Posterior cerebral arteries are normal bilaterally. No intracranial aneurysm within the posterior circulation. IMPRESSION: 1. Stable right intraventricular hemorrhage with extension into the third and fourth ventricle. Stable ventricular size. No evidence of hydrocephalus. 2. Extensive confluent T2 hyperintensity within the white matter of the cerebral hemispheres, basal ganglia, thalami, and pons, nonspecific but may be related to chronic small vessel ischemic changes. 3. Multiple foci of susceptibility artifact throughout the brain parenchyma, progressed from prior MRI. These may be related to microangiopathy versus amyloid angiopathy. Electronically Signed   By: Pedro Earls M.D.   On: 02/14/2020 14:54   DG CHEST PORT 1 VIEW  Result Date: 02/14/2020 CLINICAL DATA:  Status post PICC placement EXAM: PORTABLE CHEST 1 VIEW COMPARISON:  August 15, 2014 FINDINGS: The heart size and mediastinal contours are within normal limits. Aortic knob calcifications are seen. A right-sided central venous catheter seen with the tip at the superior cavoatrial junction. No pneumothorax. No pleural effusion. No acute osseous abnormality. IMPRESSION: Interval placement of right-sided central venous catheter with the tip at the superior cavoatrial junction. Electronically Signed   By: Prudencio Pair M.D.   On: 02/14/2020 13:09   ECHOCARDIOGRAM COMPLETE  Result Date: 02/14/2020    ECHOCARDIOGRAM REPORT   Patient Name:   ARONDA BURFORD Date of Exam: 02/14/2020 Medical Rec #:  [id number]        Height:       62.0 in Accession #:    [id number]       Weight:       155.0 lb Date of Birth:  04/23/1946       BSA:          1.715 m Patient Age:    74 years         BP:           121/56 mmHg Patient Gender: F                HR:            90 bpm. Exam Location:  Inpatient Procedure: 2D Echo Indications:    stroke 434.91  History:        Patient has prior history of Echocardiogram examinations, most                 recent 08/16/2014. Risk Factors:Hypertension and Dyslipidemia.  Sonographer:    Johny Chess Referring Phys: [id number] [place] XU IMPRESSIONS  1. Left ventricular ejection fraction, by estimation, is 65 to 70%. The left ventricle has normal function. The left ventricle has no regional wall motion abnormalities. Left ventricular diastolic parameters were normal.  2. Right ventricular systolic function is normal. The right ventricular size is normal. Tricuspid regurgitation signal is inadequate for assessing PA pressure.  3. The mitral valve is grossly normal. No evidence of mitral valve regurgitation. No evidence of mitral stenosis.  4. The aortic valve is tricuspid. Aortic valve regurgitation is not visualized. Mild aortic valve sclerosis is  present, with no evidence of aortic valve stenosis.  5. The inferior vena cava is normal in size with greater than 50% respiratory variability, suggesting right atrial pressure of 3 mmHg. Conclusion(s)/Recommendation(s): No intracardiac source of embolism detected on this transthoracic study. A transesophageal echocardiogram is recommended to exclude cardiac source of embolism if clinically indicated. FINDINGS  Left Ventricle: Left ventricular ejection fraction, by estimation, is 65 to 70%. The left ventricle has normal function. The left ventricle has no regional wall motion abnormalities. The left ventricular internal cavity size was normal in size. There is  no left ventricular hypertrophy. Left ventricular diastolic parameters were normal. Right Ventricle: The right ventricular size is normal. No increase in right ventricular wall thickness. Right ventricular systolic function is normal. Tricuspid regurgitation signal is inadequate for assessing PA pressure. Left Atrium: Left atrial size was  normal in size. Right Atrium: Right atrial size was normal in size. Pericardium: There is no evidence of pericardial effusion. Presence of pericardial fat pad. Mitral Valve: The mitral valve is grossly normal. Mild mitral annular calcification. No evidence of mitral valve regurgitation. No evidence of mitral valve stenosis. Tricuspid Valve: The tricuspid valve is grossly normal. Tricuspid valve regurgitation is not demonstrated. No evidence of tricuspid stenosis. Aortic Valve: The aortic valve is tricuspid. Aortic valve regurgitation is not visualized. Mild aortic valve sclerosis is present, with no evidence of aortic valve stenosis. Pulmonic Valve: The pulmonic valve was grossly normal. Pulmonic valve regurgitation is not visualized. No evidence of pulmonic stenosis. Aorta: The aortic root is normal in size and structure. Venous: The inferior vena cava is normal in size with greater than 50% respiratory variability, suggesting right atrial pressure of 3 mmHg. IAS/Shunts: The atrial septum is grossly normal.  LEFT VENTRICLE PLAX 2D LVIDd:         4.40 cm  Diastology LVIDs:         2.70 cm  LV e' lateral:   9.46 cm/s LV PW:         0.90 cm  LV E/e' lateral: 8.6 LV IVS:        0.70 cm  LV e' medial:    8.05 cm/s LVOT diam:     2.00 cm  LV E/e' medial:  10.1 LV SV:         74 LV SV Index:   43 LVOT Area:     3.14 cm  RIGHT VENTRICLE RV S prime:     17.10 cm/s TAPSE (M-mode): 2.5 cm LEFT ATRIUM             Index       RIGHT ATRIUM           Index LA diam:        3.40 cm 1.98 cm/m  RA Area:     11.40 cm LA Vol (A2C):   26.9 ml 15.68 ml/m RA Volume:   23.90 ml  13.93 ml/m LA Vol (A4C):   38.5 ml 22.44 ml/m LA Biplane Vol: 33.5 ml 19.53 ml/m  AORTIC VALVE LVOT Vmax:   111.00 cm/s LVOT Vmean:  70.600 cm/s LVOT VTI:    0.234 m MITRAL VALVE MV Area (PHT): 3.12 cm    SHUNTS MV Decel Time: 243 msec    Systemic VTI:  0.23 m MV E velocity: 81.00 cm/s  Systemic Diam: 2.00 cm MV A velocity: 99.80 cm/s MV E/A ratio:  0.81  Eleonore Chiquito MD Electronically signed by Eleonore Chiquito MD Signature Date/Time: 02/14/2020/4:33:15 PM    Final  [place] EKG SITE RITE  Result Date: 02/14/2020 If Site Rite image not attached, placement could not be confirmed due to current cardiac rhythm.   ELIGIBLE FOR AVAILABLE RESEARCH PROTOCOL: no  ASSESSMENT: 74 y.o. [place] woman presenting 02/13/2020 with headache, found to have a right lateral ventricle bleed, in the setting of panmyelosis, most c/w polycythemia vera.   (1) leukapheresis to most rapidly bring down the platelet and hemoglobin  (2) bone marrow biopsy and lab work ordered to obtain a definitive diagnosis of polycythemia vera  (3) rule out acquired von Willebrand disease  PLAN: CBC from today has been reviewed.  She has had a significant drop in her platelet count following pheresis performed on 02/14/2020.Her WBC and hemoglobin are more elevated today.  Recommend for repeat pheresis tomorrow morning (no availability today).  Begin hydroxyurea 1000 mg daily.  Proceed with therapeutic phlebotomy today.  Nursing order was placed this morning, but this is performed by the IV team.  Accordingly, I have placed a referral for the IV team to perform the therapeutic phlebotomy today.  We will await bone marrow biopsy results.  We will also follow-up on additional lab results which are currently pending including JAK2 mutation, erythropoietin level, von Willebrand panel, and BCR-ABL1.  Plan was reviewed with the patient's husband and all of his questions were answered.  We again reviewed the working diagnosis of polycythemia vera.  We will update the patient and her husband as results become available to [place].  I will follow with you.  [name], NP   02/15/2020 1:36 PM

## 2020-02-15 NOTE — Procedures (Signed)
  Procedure: CT bone marrow bx right iliac EBL:   minimal Complications:  none immediate  See full dictation in [facility].  Dillard Cannon MD Main # [phone] Pager  [phone]

## 2020-02-15 NOTE — Progress Notes (Signed)
For bone marrow biopsy today. Will start hydrea after procedure. Will start phlebotomy today. Received pheresis yesterday with significant drop in platelet count-- will repeat tomorrow AM (no availability today)  Full update note to follow.  GM

## 2020-02-16 ENCOUNTER — Inpatient Hospital Stay (HOSPITAL_COMMUNITY): Payer: Medicare HMO

## 2020-02-16 DIAGNOSIS — I10 Essential (primary) hypertension: Secondary | ICD-10-CM | POA: Diagnosis not present

## 2020-02-16 DIAGNOSIS — D45 Polycythemia vera: Secondary | ICD-10-CM | POA: Diagnosis not present

## 2020-02-16 DIAGNOSIS — E7800 Pure hypercholesterolemia, unspecified: Secondary | ICD-10-CM | POA: Diagnosis not present

## 2020-02-16 DIAGNOSIS — D473 Essential (hemorrhagic) thrombocythemia: Secondary | ICD-10-CM | POA: Diagnosis not present

## 2020-02-16 LAB — POCT I-STAT, CHEM 8
BUN: 16 mg/dL (ref 8–23)
Calcium, Ion: 1.24 mmol/L (ref 1.15–1.40)
Chloride: 104 mmol/L (ref 98–111)
Creatinine, Ser: 3.5 mg/dL — ABNORMAL HIGH (ref 0.44–1.00)
Glucose, Bld: 143 mg/dL — ABNORMAL HIGH (ref 70–99)
HCT: 47 % — ABNORMAL HIGH (ref 36.0–46.0)
Hemoglobin: 16 g/dL — ABNORMAL HIGH (ref 12.0–15.0)
Potassium: 3.3 mmol/L — ABNORMAL LOW (ref 3.5–5.1)
Sodium: 143 mmol/L (ref 135–145)
TCO2: 27 mmol/L (ref 22–32)

## 2020-02-16 LAB — CBC WITH DIFFERENTIAL/PLATELET
Abs Immature Granulocytes: 0.1 10*3/uL — ABNORMAL HIGH (ref 0.00–0.07)
Basophils Absolute: 0.1 10*3/uL (ref 0.0–0.1)
Basophils Relative: 1 %
Eosinophils Absolute: 0 10*3/uL (ref 0.0–0.5)
Eosinophils Relative: 0 %
HCT: 51.2 % — ABNORMAL HIGH (ref 36.0–46.0)
Hemoglobin: 16.2 g/dL — ABNORMAL HIGH (ref 12.0–15.0)
Immature Granulocytes: 1 %
Lymphocytes Relative: 8 %
Lymphs Abs: 1.4 10*3/uL (ref 0.7–4.0)
MCH: 29.9 pg (ref 26.0–34.0)
MCHC: 31.6 g/dL (ref 30.0–36.0)
MCV: 94.5 fL (ref 80.0–100.0)
Monocytes Absolute: 1.6 10*3/uL — ABNORMAL HIGH (ref 0.1–1.0)
Monocytes Relative: 9 %
Neutro Abs: 14.8 10*3/uL — ABNORMAL HIGH (ref 1.7–7.7)
Neutrophils Relative %: 81 %
Platelets: 691 10*3/uL — ABNORMAL HIGH (ref 150–400)
RBC: 5.42 MIL/uL — ABNORMAL HIGH (ref 3.87–5.11)
RDW: 14.7 % (ref 11.5–15.5)
WBC: 17.9 10*3/uL — ABNORMAL HIGH (ref 4.0–10.5)
nRBC: 0 % (ref 0.0–0.2)

## 2020-02-16 LAB — RENAL FUNCTION PANEL
Albumin: 3.8 g/dL (ref 3.5–5.0)
Anion gap: 8 (ref 5–15)
BUN: 13 mg/dL (ref 8–23)
CO2: 22 mmol/L (ref 22–32)
Calcium: 9 mg/dL (ref 8.9–10.3)
Chloride: 111 mmol/L (ref 98–111)
Creatinine, Ser: 0.8 mg/dL (ref 0.44–1.00)
GFR calc Af Amer: >60 mL/min (ref >60)
GFR calc non Af Amer: >60 mL/min (ref >60)
Glucose, Bld: 129 mg/dL — ABNORMAL HIGH (ref 70–99)
Phosphorus: 2.3 mg/dL — ABNORMAL LOW (ref 2.5–4.6)
Potassium: 3.5 mmol/L (ref 3.5–5.1)
Sodium: 141 mmol/L (ref 135–145)

## 2020-02-16 LAB — BASIC METABOLIC PANEL
Anion gap: 10 (ref 5–15)
BUN: 15 mg/dL (ref 8–23)
CO2: 24 mmol/L (ref 22–32)
Calcium: 8.5 mg/dL — ABNORMAL LOW (ref 8.9–10.3)
Chloride: 108 mmol/L (ref 98–111)
Creatinine, Ser: 0.77 mg/dL (ref 0.44–1.00)
GFR calc Af Amer: >60 mL/min (ref >60)
GFR calc non Af Amer: >60 mL/min (ref >60)
Glucose, Bld: 134 mg/dL — ABNORMAL HIGH (ref 70–99)
Potassium: 3.5 mmol/L (ref 3.5–5.1)
Sodium: 142 mmol/L (ref 135–145)

## 2020-02-16 MED ORDER — HEPARIN SODIUM (PORCINE) 1000 UNIT/ML IJ SOLN
INTRAMUSCULAR | Status: AC
  Filled 2020-02-16: qty 3

## 2020-02-16 MED ORDER — HEPARIN SODIUM (PORCINE) 1000 UNIT/ML IJ SOLN
1000.0000 [IU] | Freq: Once | INTRAMUSCULAR | Status: AC
  Administered 2020-02-16: 2400 [IU]

## 2020-02-16 MED ORDER — AMLODIPINE BESYLATE 10 MG PO TABS
10.0000 mg | ORAL_TABLET | Freq: Every day | ORAL | Status: DC
  Administered 2020-02-17 – 2020-02-26 (×9): 10 mg via ORAL
  Filled 2020-02-16 (×11): qty 1

## 2020-02-16 MED ORDER — DIPHENHYDRAMINE HCL 25 MG PO CAPS: 25.0000 mg | ORAL_CAPSULE | Freq: Four times a day (QID) | ORAL | Status: DC | PRN

## 2020-02-16 MED ORDER — CALCIUM GLUCONATE-NACL 2-0.675 GM/100ML-% IV SOLN
2.0000 g | Freq: Once | INTRAVENOUS | Status: AC
  Administered 2020-02-16: 2000 mg via INTRAVENOUS
  Filled 2020-02-16: qty 100

## 2020-02-16 MED ORDER — CALCIUM CARBONATE ANTACID 500 MG PO CHEW: 2.0000 | CHEWABLE_TABLET | ORAL | Status: DC

## 2020-02-16 MED ORDER — SODIUM CHLORIDE 0.9 % IV SOLN
INTRAVENOUS | Status: AC
  Filled 2020-02-16 (×2): qty 200

## 2020-02-16 MED ORDER — ACETAMINOPHEN 325 MG PO TABS
ORAL_TABLET | ORAL | Status: AC
  Administered 2020-02-16: 650 mg
  Filled 2020-02-16: qty 2

## 2020-02-16 MED ORDER — CALCIUM CARBONATE ANTACID 500 MG PO CHEW
CHEWABLE_TABLET | ORAL | Status: AC
  Administered 2020-02-16: 400 mg
  Filled 2020-02-16: qty 2

## 2020-02-16 MED ORDER — ACD FORMULA A 0.73-2.45-2.2 GM/100ML VI SOLN
1000.0000 mL | Status: DC
  Administered 2020-02-16: 14:00:00 1000 mL
  Filled 2020-02-16 (×2): qty 1000

## 2020-02-16 MED ORDER — ACETAMINOPHEN 325 MG PO TABS: 650.0000 mg | ORAL_TABLET | ORAL | Status: DC | PRN

## 2020-02-16 NOTE — Evaluation (Signed)
Occupational Therapy Evaluation Patient Details Name: [name] MRN: [id number] DOB: 04-Aug-1946 Today's Date: 02/16/2020    History of Present Illness 74 yo female presented with persistent HA, nausea and vomiting. pt with stable R latearl ventricle IVH PMH HTN   Clinical Impression   PT admitted with R IVH . Pt currently with functional limitiations due to the deficits listed below (see OT problem list). Pt currently with dizziness, HA pain restricting and balance deficits. Pt with increase in HA with movement and occluding vision as a result in one eye. Pt engaged in sink level task but required sitting positioning.  Pt will benefit from skilled OT to increase their independence and safety with adls and balance to allow discharge [place].     Follow Up Recommendations  Home health OT    Equipment Recommendations  3 in 1 bedside commode    Recommendations for Other Services       Precautions / Restrictions Precautions Precautions: Fall      Mobility Bed Mobility Overal bed mobility: Needs Assistance Bed Mobility: Supine to Sit     Supine to sit: Min assist     General bed mobility comments: pt able to power up and move toward eob but needs (A) to trunk with immediate report of HA increase. pt again reports with standing  Transfers Overall transfer level: Needs assistance Equipment used: 2 person hand held assist Transfers: Sit to/from Stand Sit to Stand: Min assist         General transfer comment: pt requires (A) initially with a posterior bias but able to correct with increased time. pt reports immediate increase in ha and feeling dizzy    Balance Overall balance assessment: Needs assistance Sitting-balance support: Bilateral upper extremity supported;Feet supported Sitting balance-Leahy Scale: Fair     Standing balance support: During functional activity;Single extremity supported Standing balance-Leahy Scale: Poor                             ADL either performed or assessed with clinical judgement   ADL Overall ADL's : Needs assistance/impaired Eating/Feeding: Set up;Sitting   Grooming: Oral care;Min guard;Sitting Grooming Details (indicate cue type and reason): pt unable to sustain standing             Lower Body Dressing: Moderate assistance;Sit to/from stand Lower Body Dressing Details (indicate cue type and reason): pt requires (A) to start sock and able to cross bil Le but unable to sustain. reports pain at R hip area  Toilet Transfer: Minimal assistance Toilet Transfer Details (indicate cue type and reason): requires hand held (A) for balacne         Functional mobility during ADLs: Minimal assistance General ADL Comments: pt noted to initially on arrival to be covering L eye with R eye used to view staff and reports HA with comfort applying pressing to that area of head. Pt later in session noted to close R eye and view iwth L eye. Dominant eye assessed and confirmed to be R eye dominant.      Vision Baseline Vision/History: Wears glasses Wears Glasses: At all times Vision Assessment?: No apparent visual deficits;Yes Eye Alignment: Within Functional Limits Ocular Range of Motion: Within Functional Limits Alignment/Gaze Preference: Within Defined Limits Tracking/Visual Pursuits: Able to track stimulus in all quads without difficulty Saccades: Within functional limits Convergence: Within functional limits Additional Comments: occluding vision to one eye but functionally no visual deficits noted. Visual testing no visual  deficit noted     Perception     Praxis      Pertinent Vitals/Pain Pain Assessment: 0-10 Pain Score: 5  Pain Descriptors / Indicators: Headache Pain Intervention(s): Limited activity within patient's tolerance     Hand Dominance Right   Extremity/Trunk Assessment Upper Extremity Assessment Upper Extremity Assessment: LUE deficits/detail LUE Deficits / Details: weakness noted  but using functionally with decrease grasp LUE Coordination: decreased gross motor;decreased fine motor   Lower Extremity Assessment Lower Extremity Assessment: LLE deficits/detail LLE Deficits / Details: weakness   Cervical / Trunk Assessment Cervical / Trunk Assessment: Normal(noted to have PICC line in neck R side)   Communication Communication Communication: No difficulties   Cognition Arousal/Alertness: Awake/alert Behavior During Therapy: Flat affect Overall Cognitive Status: Impaired/Different from baseline Area of Impairment: Awareness;Memory;Attention                   Current Attention Level: Sustained Memory: Decreased short-term memory     Awareness: Emergent   General Comments: slow processing   General Comments  HA throughout session. ice provided at end of session for head. pt dizzy and nauseated from dizziness. pt drinks caffeine daily and has not had any since admission    Exercises     Shoulder Instructions      Home Living Family/patient expects to be discharged to:: Private residence Living Arrangements: Spouse/significant other Available Help at Discharge: Family Type of Home: House Home Access: Stairs to enter [employer] of Steps: 2 Entrance Stairs-Rails: None Home Layout: Two level;Able to live on main level with bedroom/bathroom     Bathroom Shower/Tub: Teacher, early years/pre: Standard     Home Equipment: None          Prior Functioning/Environment Level of Independence: Independent        Comments: pt drives, does everything for self.        OT Problem List: Decreased strength;Decreased activity tolerance;Impaired balance (sitting and/or standing);Decreased safety awareness;Decreased cognition;Decreased knowledge of precautions;Decreased knowledge of use of DME or AE;Pain      OT Treatment/Interventions: Self-care/ADL training;Therapeutic exercise;DME and/or AE instruction;Therapeutic  activities;Cognitive remediation/compensation;Patient/family education;Balance training;Visual/perceptual remediation/compensation    OT Goals(Current goals can be found in the care plan section) Acute Rehab OT Goals Patient Stated Goal: to make HA stop OT Goal Formulation: With patient Time For Goal Achievement: 03/01/20 Potential to Achieve Goals: Good  OT Frequency: Min 2X/week   Barriers to D/C:            Co-evaluation              AM-PAC OT "6 Clicks" Daily Activity     Outcome Measure Help from another person eating meals?: A Little Help from another person taking care of personal grooming?: A Little Help from another person toileting, which includes using toliet, bedpan, or urinal?: A Little Help from another person bathing (including washing, rinsing, drying)?: A Little Help from another person to put on and taking off regular upper body clothing?: A Little Help from another person to put on and taking off regular lower body clothing?: A Lot 6 Click Score: 17   End of Session Equipment Utilized During Treatment: Gait belt Nurse Communication: Mobility status;Precautions  Activity Tolerance: Patient tolerated treatment well Patient left: in chair;with call bell/phone within reach;with chair alarm set;with family/visitor present  OT Visit Diagnosis: Unsteadiness on feet (R26.81);Muscle weakness (generalized) (M62.81)                Time: [id number]  OT Time Calculation (min): 28 min Charges:  OT General Charges $OT Visit: 1 Visit OT Evaluation $OT Eval Moderate Complexity: 1 Mod   Brynn, OTR/L  Acute Rehabilitation Services Pager: [phone] Office: [phone] .   Jeri Modena 02/16/2020, 11:27 AM

## 2020-02-16 NOTE — Progress Notes (Signed)
Patient off unit to CT scan with transport

## 2020-02-16 NOTE — Plan of Care (Signed)
  Problem: Education: Goal: Knowledge of disease or condition will improve Outcome: Progressing Goal: Knowledge of secondary prevention will improve Outcome: Progressing Goal: Knowledge of patient specific risk factors addressed and post discharge goals established will improve Outcome: Progressing   Problem: Coping: Goal: Will identify appropriate support needs Outcome: Progressing   Problem: Coping: Goal: Will verbalize positive feelings about self Outcome: Progressing Goal: Will identify appropriate support needs Outcome: Progressing   Problem: Nutrition: Goal: Risk of aspiration will decrease Outcome: Progressing Goal: Dietary intake will improve Outcome: Progressing

## 2020-02-16 NOTE — TOC Initial Note (Signed)
Transition of Care [facility]) - Initial/Assessment Note    Patient Details  Name: [name] MRN: [id number] Date of Birth: [birth year]  [facility] CM/SW Contact:    Pollie Friar, RN Phone Number: 02/16/2020, 4:24 PM  Clinical Narrative:                 Pt with orders for [facility] services. CM provided choice and [facility] selected. Britney with [facility] accepted the referral.  TOC following for further d/c needs.  Expected Discharge Plan: [place] Barriers to Discharge: Continued Medical Work up   Patient Goals and CMS Choice   CMS Medicare.gov Compare Post Acute Care list provided to:: Patient Choice offered to / list presented to : Patient  Expected Discharge Plan and Services Expected Discharge Plan: [place]   Discharge Planning Services: CM Consult Post Acute Care Choice: [place] arrangements for the past 2 months: Single Family Home                           HH Arranged: PT, OT, Speech Therapy HH Agency: [facility] Date [facility] Agency Contacted: 02/16/20   Representative spoke with at [place]: [place]  Prior Living Arrangements/Services Living arrangements for the past 2 months: [place] Lives with:: Spouse Patient language and need for interpreter reviewed:: Yes Do you feel safe going back to the place where you live?: Yes            Criminal Activity/Legal Involvement Pertinent to Current Situation/Hospitalization: No - Comment as needed  Activities of Daily Living Home Assistive Devices/Equipment: Eyeglasses ADL Screening (condition at time of admission) Patient's cognitive ability adequate to safely complete daily activities?: Yes Is the patient deaf or have difficulty hearing?: No Does the patient have difficulty seeing, even when wearing glasses/contacts?: No Does the patient have difficulty concentrating, remembering, or making decisions?: Yes Patient able to express need  for assistance with ADLs?: Yes Does the patient have difficulty dressing or bathing?: No Independently performs ADLs?: Yes (appropriate for developmental age) Does the patient have difficulty walking or climbing stairs?: No Weakness of Legs: None Weakness of Arms/Hands: Both  Permission Sought/Granted                  Emotional Assessment Appearance:: Appears stated age     Orientation: : Oriented to Self, Oriented to Place, Oriented to  Time, Oriented to Situation   Psych Involvement: No (comment)  Admission diagnosis:  Polycythemia [D75.1] ICH (intracerebral hemorrhage) ([place]) [I61.9] Thrombocytosis ([place]) [D47.3] Nontraumatic subcortical hemorrhage of right cerebral hemisphere ([place]) [I61.0] Elevated blood pressure reading with diagnosis of hypertension [I10] Patient Active Problem List   Diagnosis Date Noted  . ICH (intracerebral hemorrhage) ([place]) 02/14/2020  . TIA (transient ischemic attack) 08/17/2014  . Acute sinusitis 08/16/2014  . Headache 08/15/2014  . Hypothyroidism 08/15/2014  . Hepatitis C   . Hyperlipidemia   . Hypertension    PCP:  Cari Caraway, MD Pharmacy:   [id number] - [place], [address] [place] [address] Phone: [phone] Fax: [phone]     Social Determinants of Health (SDOH) Interventions    Readmission Risk Interventions No flowsheet data found.

## 2020-02-16 NOTE — Evaluation (Signed)
Speech Language Pathology Evaluation Patient Details Name: [name] MRN: [id number] DOB: Mar 25, 1946 Today's Date: 02/16/2020 Time: [id number] SLP Time Calculation (min) (ACUTE ONLY): 13 min  Problem List:  Patient Active Problem List   Diagnosis Date Noted  . ICH (intracerebral hemorrhage) ([place]) 02/14/2020  . TIA (transient ischemic attack) 08/17/2014  . Acute sinusitis 08/16/2014  . Headache 08/15/2014  . Hypothyroidism 08/15/2014  . Hepatitis C   . Hyperlipidemia   . Hypertension    Past Medical History:  Past Medical History:  Diagnosis Date  . Hepatitis C   . Hyperlipidemia   . Hypertension   . Osteoporosis    Past Surgical History: No past surgical history on file. HPI:  74 yo female adm to [facility] with ICH.  PMH + for HepC, HTN, HLD, osteoporosis.  Pt MRI showed basal ganglia thalami pons hyperdensity within white matter.  Pt is a retired Radio broadcast assistant.   Assessment / Plan / Recommendation Clinical Impression  Portion of SLUMS administered with her scoring 7/16 points thus far.   Pt also presented with mild dsyarthria c/b minimal imprecise articulation but intelligible speech.  Pt complained of significant headache that worsened during session as well as nausea.  SLP provided pt with emesis basin for her needs and she declined offer of calling RN to determine if pain medication could be provided.  Ice pack had previously been provided to pt and she used it after session.      Expressive language was fluent during session and pt is right handed.  Session was abbreviated due to pt's discomfort.  Decreased attention and perseveration noted x1 and pt expressed frustration with tasks. Uncertain if frustration was due to task and/or due to discomfort.    Advised pt that will pass on remainder of test to be completed by SlP following up and reviewed scoring thus far. Pt agreeable to plan and no family present.  .    SLP Assessment  SLP Recommendation/Assessment: Patient needs  continued Speech Lanaguage Pathology Services SLP Visit Diagnosis: Cognitive communication deficit (R41.841)    Follow Up Recommendations  None    Frequency and Duration min 1 x/week  1 week      SLP Evaluation Cognition  Overall Cognitive Status: Impaired/Different from baseline Orientation Level: Oriented X4 Attention: Sustained Sustained Attention: Impaired Sustained Attention Impairment: Verbal complex Memory: Impaired Memory Impairment: Retrieval deficit(pt benefited from category cue for 3/5 items, multiple choice for 1/5 items and recalled one item without cue)       Comprehension  Auditory Comprehension Overall Auditory Comprehension: Appears within functional limits for tasks assessed Yes/No Questions: Not tested Commands: Not tested Conversation: Simple Interfering Components: Attention;Working memory;Pain EffectiveTechniques: Conservation officer, nature: Not tested Reading Comprehension Reading Status: Not tested    Expression Expression Primary Mode of Expression: Verbal Verbal Expression Overall Verbal Expression: Appears within functional limits for tasks assessed Initiation: No impairment Repetition: (dnt) Naming: (dnt) Pragmatics: (? flat affect however pt also with significant pain) Written Expression Dominant Hand: Right Written Expression: Not tested   Oral / Motor  Motor Speech Overall Motor Speech: Appears within functional limits for tasks assessed Respiration: Within functional limits Resonance: Within functional limits Articulation: Within functional limitis(mildly dysarthric but comprehensible) Motor Speech Errors: Not applicable   GO                    Macario Golds 02/16/2020, 2:05 PM  Kathleen Lime, MS [place] Office [phone]

## 2020-02-16 NOTE — Progress Notes (Signed)
STROKE TEAM PROGRESS NOTE   INTERVAL HISTORY Son at bedside.  Patient lying in bed, still complains of headache, still the same not getting better or worse.  Had again pheresis today.  Pancytosis getting improved.  PT/OT recommend home PT/OT.  CT head repeat stable IVH, ventricle size.  Vitals:   02/16/20 1329 02/16/20 1345 02/16/20 1400 02/16/20 1445  BP: (!) 144/68 (!) 144/59 (!) 118/42 (!) 147/71  Pulse: 65 66 65 (!) 56  Resp: 14 14 14 13   Temp:    97.6 F (36.4 C)  TempSrc:    Oral  SpO2:    95%  Weight:      Height:       CBC:  Recent Labs  Lab 02/15/20 0553 02/15/20 0553 02/16/20 0355 02/16/20 1329  WBC 24.4*  --  17.9*  --   NEUTROABS 21.3*  --  14.8*  --   HGB 18.6*   < > 16.2* 16.0*  HCT 58.5*   < > 51.2* 47.0*  MCV 92.4  --  94.5  --   PLT 738*  --  691*  --    < > = values in this interval not displayed.   Basic Metabolic Panel:  Recent Labs  Lab 02/14/20 1550 02/14/20 1550 02/16/20 1029 02/16/20 1329  NA 141   < > 142 143  K 3.7   < > 3.5 3.3*  CL 105   < > 108 104  CO2 23  --  24  --   GLUCOSE 129*   < > 134* 143*  BUN 18   < > 15 16  CREATININE 0.94   < > 0.77 3.50*  CALCIUM 9.7  --  8.5*  --    < > = values in this interval not displayed.   Lipid Panel:     Component Value Date/Time   CHOL 172 02/14/2020 0959   TRIG 46 02/14/2020 0959   HDL 71 02/14/2020 0959   CHOLHDL 2.4 02/14/2020 0959   VLDL 9 02/14/2020 0959   LDLCALC 92 02/14/2020 0959   HgbA1c:  Lab Results  Component Value Date   HGBA1C 6.1 (H) 02/14/2020   Urine Drug Screen: No results found for: LABOPIA, COCAINSCRNUR, LABBENZ, AMPHETMU, THCU, LABBARB  Alcohol Level No results found for: ETH  IMAGING past 24 hours CT HEAD WO CONTRAST  Result Date: 02/16/2020 CLINICAL DATA:  Intracranial hemorrhage, follow-up EXAM: CT HEAD WITHOUT CONTRAST TECHNIQUE: Contiguous axial images were obtained from the base of the skull through the vertex without intravenous contrast. COMPARISON:   02/14/2020 FINDINGS: Brain: Intraventricular hemorrhage is again identified primarily within the right lateral ventricle. There is no new hemorrhage. Caliber of the ventricles is similar. No new loss of gray differentiation. Confluent hypoattenuation is again identified supratentorial white matter. Vascular: No new finding. Skull: Remains unremarkable. Sinuses/Orbits: No acute finding. Other: None. IMPRESSION: No substantial change in intraventricular hemorrhage primarily within the right lateral ventricle. No new hemorrhage. Stable caliber of the ventricles. Electronically Signed   By: Macy Mis M.D.   On: 02/16/2020 07:09    PHYSICAL EXAM  Temp:  [97.6 F (36.4 C)-99.3 F (37.4 C)] 97.6 F (36.4 C) (04/07 1445) Pulse Rate:  [56-90] 56 (04/07 1445) Resp:  [13-18] 13 (04/07 1445) BP: (118-175)/(42-76) 147/71 (04/07 1445) SpO2:  [94 %-97 %] 95 % (04/07 1445)  General - Well nourished, well developed, mildly lethargic  Ophthalmologic - fundi not visualized due to noncooperation.  Cardiovascular - Regular rhythm and rate.  Mental Status -  Mildly lethargic, awake, alert and fully orientation to time, place, and person were intact. Mild nuchal rigidity with pain on neck flexion Language including expression, naming, repetition, comprehension was assessed and found intact. Fund of Knowledge was assessed and was intact.  Cranial Nerves II - XII - II - Visual field intact OU. III, IV, VI - Extraocular movements intact. V - Facial sensation intact bilaterally. VII - mild left facial droop. VIII - Hearing & vestibular intact bilaterally. X - Palate elevates symmetrically. XI - Chin turning & shoulder shrug intact bilaterally. XII - Tongue protrusion intact.  Motor Strength - The patient's strength was normal in all extremities and pronator drift was absent.  Bulk was normal and fasciculations were absent.   Motor Tone - Muscle tone was assessed at the neck and appendages and was  normal.  Reflexes - The patient's reflexes were symmetrical in all extremities and she had no pathological reflexes.  Sensory - Light touch, temperature/pinprick were assessed and were symmetrical.    Coordination - The patient had normal movements in the hands with no ataxia or dysmetria.  Tremor was absent.  Gait and Station - deferred.   ASSESSMENT/PLAN [name] is a 74 y.o. female with history of HTN presenting with severe persistent HA accompanied by nausea and vomiting.   IVH - right lateral ventricle IVH secondary to ? MDS vs. CAA vs. HTN  CT head 4/5 0032 R lateral IVH. Severe chronic ischemic microangiopathy.  CT head 4/5 0857 no sign change  MRI Stable IVH, severe leukoaraiosis. Multiple foci throughout progressed from previous MRI d/t microangiopathy vs amyloid.  MRA unremarkable   Repeat CT head 4/7 stable IVH and ventricle size  2D Echo EF 65-70%. No source of embolus   LDL 92   HgbA1c 6.1   SCDs for VTE prophylaxis  aspirin 81 mg daily prior to admission, now on No antithrombotic given IVH  Therapy recommendations:  HH PT, HH OT, HH SLP  Disposition:  Possible d/c tomorrow after pheresis  Pancytosis, improving - likely polycythemia vera, ? MDS  WBC 12->24.4->17.9, Hgb 17.1->18.6->16.2->16.0, PLT 990->738->691  Dr. Ron Agee on board   Bone marrow bx done, results pending   on hydrea  S/p Phlebotomy and pheresis  Plan to have pheresis again tomorrow  erythropoietin level 1.3 (L)  JAK2 pending    Further workup as an OP with Dr. Ron Agee  Hypertension  Home meds:  losartan 100, amlodipine 5  On losartan 50 bid and amlodipine 10 . SBP goal < 140  Now Off Cleviprex  Stable on the high end . 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 Multiple foci  throughout progressed from previous MRI d/t microangiopathy vs amyloid.  As per husband, pt has some anomia as baseline  BP goal < 140  Avoid antiplatelet or anticoagulation  Hyperlipidemia  Home meds:  lipitor 10  LDL 92  Statin held in setting of acute ICH  Consider continuation of statin at discharge  Other Stroke Risk Factors  Advanced age  Former Cigarette smoker  Overweight, Body mass index is 26.73 kg/m., recommend weight loss, diet and exercise as appropriate   Family hx stroke (mother, father, and multiple other family members)  Other Active Problems  Hepatitis C  Hypothyroid on synthroid. TSH WNL - resume synthroid   Hospital day # 2  Kayson Tasker  Erlinda Hong, MD PhD Stroke Neurology 02/16/2020 3:41 PM  To contact Stroke Continuity provider, please refer to [email or web address] After hours, contact General Neurology

## 2020-02-16 NOTE — Evaluation (Signed)
Physical Therapy Evaluation Patient Details Name: [name] MRN: [id number] DOB: 06/02/1946 Today's Date: 02/16/2020   History of Present Illness  74 yo female presented with persistent HA, nausea and vomiting. pt with stable R latearl ventricle IVH PMH HTN  Clinical Impression  Pt presents with generalized weakness, severe headache, mild cognitive changes, mild unsteadiness in standing, decreased activity tolerance, and difficulty performing mobility tasks. Pt to benefit from acute PT to address deficits. Pt ambulated short room distance with seated rest as needed and min assist to steady, pt most limited by severity of headache symptoms with mobility. PT educated pt on keeping stimulation low (low lights, TV off, no phone) to keep headache to a minimum, and taking rests as needed especially after mobility. PT recommending HHPT to address mobility deficits. PT to progress mobility as tolerated, and will continue to follow acutely.      Follow Up Recommendations Home health PT;Supervision for mobility/OOB    Equipment Recommendations  None recommended by PT    Recommendations for Other Services       Precautions / Restrictions Precautions Precautions: Fall Restrictions Weight Bearing Restrictions: No      Mobility  Bed Mobility Overal bed mobility: Needs Assistance Bed Mobility: Supine to Sit     Supine to sit: Min assist     General bed mobility comments: Min assist for truncal elevation, reports of increased headache upon sitting EOB.  Transfers Overall transfer level: Needs assistance Equipment used: 2 person hand held assist Transfers: Sit to/from Stand Sit to Stand: Min assist         General transfer comment: Min assist for steadying, posterior leaning upon initial stand which pt self-corrected with cuing.  Ambulation/Gait Ambulation/Gait assistance: +2 safety/equipment;Min assist Gait Distance (Feet): 20 Feet(to and from sink) Assistive device:  None Gait Pattern/deviations: Step-through pattern;Decreased stride length;Trunk flexed Gait velocity: decr   General Gait Details: min assist to steady, verbal cuing for room navigation. Increasing headache with mobility, no blurring vision per pt report.  Stairs            Wheelchair Mobility    Modified Rankin (Stroke Patients Only) Modified Rankin (Stroke Patients Only) Pre-Morbid Rankin Score: No symptoms Modified Rankin: Moderately severe disability     Balance Overall balance assessment: Needs assistance Sitting-balance support: Bilateral upper extremity supported;Feet supported Sitting balance-Leahy Scale: Fair     Standing balance support: During functional activity;Single extremity supported Standing balance-Leahy Scale: Fair Standing balance comment: does not require external support, but generally unsteady                             Pertinent Vitals/Pain Pain Assessment: 0-10 Pain Score: 5  Pain Location: HA Pain Descriptors / Indicators: Headache Pain Intervention(s): Limited activity within patient's tolerance;Monitored during session;Repositioned;RN gave pain meds during session    [place] expects to be discharged to:: Private residence Living Arrangements: Spouse/significant other Available Help at Discharge: Family Type of Home: House Home Access: Stairs to enter Entrance Stairs-Rails: None Entrance Stairs-Number of Steps: 2 Home Layout: Two level;Able to live on main level with bedroom/bathroom Home Equipment: None      Prior Function Level of Independence: Independent         Comments: pt drives, does everything for self PTA     Hand Dominance   Dominant Hand: Right    Extremity/Trunk Assessment   Upper Extremity Assessment Upper Extremity Assessment: Defer to OT evaluation LUE Deficits /  Details: weakness noted but using functionally with decrease grasp LUE Coordination: decreased gross  motor;decreased fine motor    Lower Extremity Assessment Lower Extremity Assessment: Generalized weakness(symmetric functional weakness noted) LLE Deficits / Details: weakness    Cervical / Trunk Assessment Cervical / Trunk Assessment: Normal  Communication   Communication: No difficulties  Cognition Arousal/Alertness: Awake/alert Behavior During Therapy: Flat affect Overall Cognitive Status: Impaired/Different from baseline Area of Impairment: Awareness;Memory;Attention                   Current Attention Level: Sustained Memory: Decreased short-term memory     Awareness: Emergent   General Comments: slow processing, inability to attend to task and conversation simultaneously i.e. put on socks and answer home set up question      General Comments General comments (skin integrity, edema, etc.): HA throughout session. ice provided at end of session for head. pt dizzy and nauseated from dizziness. pt drinks caffeine daily and has not had any since admission    Exercises     Assessment/Plan    PT Assessment Patient needs continued PT services  PT Problem List Decreased strength;Decreased mobility;Decreased safety awareness;Decreased activity tolerance;Decreased knowledge of precautions;Decreased balance;Pain       PT Treatment Interventions DME instruction;Therapeutic activities;Gait training;Therapeutic exercise;Patient/family education;Balance training;Stair training;Functional mobility training;Neuromuscular re-education    PT Goals (Current goals can be found in the Care Plan section)  Acute Rehab PT Goals Patient Stated Goal: to make HA stop PT Goal Formulation: With patient Time For Goal Achievement: 03/01/20 Potential to Achieve Goals: Good    Frequency Min 4X/week   Barriers to discharge        Co-evaluation               AM-PAC PT "6 Clicks" Mobility  Outcome Measure Help needed turning from your back to your side while in a flat bed  without using bedrails?: A Little Help needed moving from lying on your back to sitting on the side of a flat bed without using bedrails?: A Little Help needed moving to and from a bed to a chair (including a wheelchair)?: A Little Help needed standing up from a chair using your arms (e.g., wheelchair or bedside chair)?: A Little Help needed to walk in hospital room?: A Little Help needed climbing 3-5 steps with a railing? : A Lot 6 Click Score: 17    End of Session Equipment Utilized During Treatment: Gait belt Activity Tolerance: Patient limited by fatigue;Patient limited by pain(headache) Patient left: in chair;with call bell/phone within reach;with chair alarm set Nurse Communication: Mobility status PT Visit Diagnosis: Other abnormalities of gait and mobility (R26.89);Pain;Other symptoms and signs involving the nervous system (R29.898) Pain - part of body: (headache)    Time: [id number] PT Time Calculation (min) (ACUTE ONLY): 29 min   Charges:   PT Evaluation $PT Eval Low Complexity: 1 Low         Tinsley Lomas E, PT Acute Rehabilitation Services Pager [phone]  Office [phone]   Hang Ammon D Elonda Husky 02/16/2020, 2:28 PM

## 2020-02-16 NOTE — Progress Notes (Signed)
Patient returned from CT scan.

## 2020-02-17 ENCOUNTER — Encounter (HOSPITAL_COMMUNITY): Payer: Self-pay | Admitting: Neurology

## 2020-02-17 DIAGNOSIS — E854 Organ-limited amyloidosis: Secondary | ICD-10-CM

## 2020-02-17 DIAGNOSIS — B182 Chronic viral hepatitis C: Secondary | ICD-10-CM | POA: Diagnosis not present

## 2020-02-17 DIAGNOSIS — Z452 Encounter for adjustment and management of vascular access device: Secondary | ICD-10-CM | POA: Diagnosis not present

## 2020-02-17 DIAGNOSIS — D45 Polycythemia vera: Secondary | ICD-10-CM | POA: Diagnosis present

## 2020-02-17 LAB — CBC WITH DIFFERENTIAL/PLATELET
Abs Immature Granulocytes: 0.12 10*3/uL — ABNORMAL HIGH (ref 0.00–0.07)
Basophils Absolute: 0.1 10*3/uL (ref 0.0–0.1)
Basophils Relative: 1 %
Eosinophils Absolute: 0 10*3/uL (ref 0.0–0.5)
Eosinophils Relative: 0 %
HCT: 47.7 % — ABNORMAL HIGH (ref 36.0–46.0)
Hemoglobin: 15.4 g/dL — ABNORMAL HIGH (ref 12.0–15.0)
Immature Granulocytes: 1 %
Lymphocytes Relative: 9 %
Lymphs Abs: 1.6 10*3/uL (ref 0.7–4.0)
MCH: 29.8 pg (ref 26.0–34.0)
MCHC: 32.3 g/dL (ref 30.0–36.0)
MCV: 92.4 fL (ref 80.0–100.0)
Monocytes Absolute: 1.3 10*3/uL — ABNORMAL HIGH (ref 0.1–1.0)
Monocytes Relative: 7 %
Neutro Abs: 14.7 10*3/uL — ABNORMAL HIGH (ref 1.7–7.7)
Neutrophils Relative %: 82 %
Platelets: 669 10*3/uL — ABNORMAL HIGH (ref 150–400)
RBC: 5.16 MIL/uL — ABNORMAL HIGH (ref 3.87–5.11)
RDW: 14.3 % (ref 11.5–15.5)
WBC: 17.8 10*3/uL — ABNORMAL HIGH (ref 4.0–10.5)
nRBC: 0 % (ref 0.0–0.2)

## 2020-02-17 LAB — VON WILLEBRAND PANEL
Coagulation Factor VIII: 142 % — ABNORMAL HIGH (ref 56–140)
Ristocetin Co-factor, Plasma: 31 % — ABNORMAL LOW (ref 50–200)
Von Willebrand Antigen, Plasma: 121 % (ref 50–200)

## 2020-02-17 LAB — JAK2  V617F QUAL. WITH REFLEX TO EXON 12: Reflex:: 15

## 2020-02-17 LAB — SURGICAL PATHOLOGY

## 2020-02-17 LAB — COAG STUDIES INTERP REPORT

## 2020-02-17 LAB — URIC ACID: Uric Acid, Serum: 3.2 mg/dL (ref 2.5–7.1)

## 2020-02-17 MED ORDER — HYDROXYUREA 500 MG PO CAPS
1000.0000 mg | ORAL_CAPSULE | Freq: Every day | ORAL | Status: DC
  Administered 2020-02-17 – 2020-02-24 (×7): 1000 mg via ORAL
  Filled 2020-02-17 (×9): qty 2

## 2020-02-17 MED ORDER — HYDRALAZINE HCL 25 MG PO TABS
25.0000 mg | ORAL_TABLET | Freq: Three times a day (TID) | ORAL | Status: DC
  Administered 2020-02-17 – 2020-02-26 (×22): 25 mg via ORAL
  Filled 2020-02-17 (×22): qty 1

## 2020-02-17 MED ORDER — ALLOPURINOL 300 MG PO TABS
300.0000 mg | ORAL_TABLET | Freq: Every day | ORAL | Status: DC
  Administered 2020-02-17 – 2020-02-26 (×9): 300 mg via ORAL
  Filled 2020-02-17 (×2): qty 3
  Filled 2020-02-17: qty 1
  Filled 2020-02-17 (×2): qty 3
  Filled 2020-02-17: qty 1
  Filled 2020-02-17 (×2): qty 3
  Filled 2020-02-17: qty 1
  Filled 2020-02-17 (×2): qty 3

## 2020-02-17 NOTE — Progress Notes (Signed)
[place]  [phone]) [phone] [phone]) [phone]     ID: [name] DOB: 1946-10-09  MR#: [id number]  YTK#:[id number]  Patient Care Team: Cari Caraway, MD as PCP - General (Family Medicine) Chauncey Cruel, MD OTHER MD:  CHIEF COMPLAINT: Follow-up intracranial bleed, polycythemia  CURRENT TREATMENT: Pheresis, Hydrea, therapeutic phlebotomy  INTERVAL HISTORY: Victoria did well with bone marrow biopsy. Initial reading is c/w P Vera. JAK2 and other confirmatory labs pending but epo level is low, c/w diagnosis. She has had pheresis x 2 with some improvement in counts. Is on hydrea and allopurinol. Central line in place  REVIEW OF SYSTEMS: Dola is much more alert this AM. She is conversant and normally interactive. She tells me she has not done any walking (aside from PT eval yesterday). She still has a h/a. She tells me she has never seen a neurologist outside the hospital   HISTORY OF CURRENT ILLNESS: From the original consult note:  Ms. Duke has a history of hypertension followed by Dr. Theadore Nan.  For the last few days she has had headache and she took her blood pressure and it was as high as 168/102 which is unusual for her.  As this was not improving she presented to the emergency room last night 02/13/2020. Dr Roxanne Mins obtained a basic lab work and a head CT.  The head CT without contrast showed an acute intraventricular hemorrhage in the right lateral ventricle, with no parenchymal component.  The lab work showed a white cell count of 12.0, hemoglobin 17.7, and platelets 990,000.  We were consulted for further evaluation and treatment.    PAST MEDICAL HISTORY: Past Medical History:  Diagnosis Date  . Hepatitis C   . Hyperlipidemia   . Hypertension   . Osteoporosis     PAST SURGICAL HISTORY: History reviewed. No pertinent surgical history.  FAMILY HISTORY History reviewed. No pertinent family history. The patient's father died at age  74 and the patient's mother at age 40 both from heart disease.  The patient has 1 sister, no brothers.  There is no family history of blood problems and no family history of cancer to the patient's knowledge  GYNECOLOGIC HISTORY:  No LMP recorded. Patient is postmenopausal. Menarche: 74 years old Age at first live birth: 74 years old [place] P 2 LMP 57 HRT yes, a few years  Hysterectomy?  Salpingo-oophorectomy?  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  Her husband [name] ran a business but is now retired.  Their son [name] is a Adult nurse.  Their son [name] is an Pharmacologist.  The patient has 1 grandson and 4 step grandchildren.  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    ADVANCED DIRECTIVES: In the absence of any documents to the contrary the patient's husband is her healthcare power of attorney   HEALTH MAINTENANCE: Social History   Tobacco Use  . Smoking status: Former Research scientist (life sciences)  . Smokeless tobacco: Never Used  Substance Use Topics  . Alcohol use: No  . Drug use: No     Colonoscopy: [facility]  PAP: [place]  Bone density:  Mammogram: Due   Allergies  Allergen Reactions  . Erythromycin Itching and Other (See Comments)    Severe stomach pains, diarrhea    Current Facility-Administered Medications  Medication Dose Route Frequency Provider Last Rate Last Admin  .  stroke: mapping our early stages of recovery book   Does not apply Once Greta Doom, MD      .  0.9 %  sodium chloride infusion   Intravenous Continuous Rosalin Hawking, MD 50 mL/hr at 02/17/20 0235 New Bag at 02/17/20 0235  . acetaminophen (TYLENOL) tablet 650 mg  650 mg Oral Q6H PRN Rosalin Hawking, MD   650 mg at 02/16/20 1027   Or  . acetaminophen (TYLENOL) 160 MG/5ML solution 650 mg  650 mg Per Tube Q6H PRN Rosalin Hawking, MD       Or  . acetaminophen (TYLENOL) suppository 650 mg  650 mg Rectal Q6H PRN Rosalin Hawking, MD      . amLODipine (NORVASC) tablet 10 mg  10 mg Oral Daily Rosalin Hawking, MD       . butalbital-acetaminophen-caffeine (FIORICET) [phone] MG per tablet 1 tablet  1 tablet Oral Q8H PRN Rosalin Hawking, MD   1 tablet at 02/17/20 [phone]  . chlorhexidine (PERIDEX) 0.12 % solution 15 mL  15 mL Mouth Rinse BID Greta Doom, MD   15 mL at 02/16/20 2210  . Chlorhexidine Gluconate Cloth 2 % PADS 6 each  6 each Topical Q0600 Greta Doom, MD   6 each at 02/16/20 1037  . hydroxyurea (HYDREA) capsule 1,000 mg  1,000 mg Oral Daily Kaylinn Dedic, Virgie Dad, MD   1,000 mg at 02/16/20 1018  . labetalol (NORMODYNE) injection 10-20 mg  10-20 mg Intravenous Q2H PRN Rosalin Hawking, MD      . levothyroxine (SYNTHROID) tablet 125 mcg  125 mcg Oral QAC breakfast Rosalin Hawking, MD   125 mcg at 02/17/20 [phone]  . losartan (COZAAR) tablet 50 mg  50 mg Oral BID Rosalin Hawking, MD   50 mg at 02/16/20 2211  . ondansetron (ZOFRAN) injection 4 mg  4 mg Intravenous Q4H PRN Greta Doom, MD   4 mg at 02/15/20 1220  . pantoprazole (PROTONIX) EC tablet 40 mg  40 mg Oral Daily Rosalin Hawking, MD   40 mg at 02/16/20 1018  . senna-docusate (Senokot-S) tablet 1 tablet  1 tablet Oral BID Greta Doom, MD   1 tablet at 02/16/20 2211  . sodium chloride 0.9 % bolus 1,000 mL  1,000 mL Intravenous Once Delora Fuel, MD      . sodium chloride flush (NS) 0.9 % injection 10-40 mL  10-40 mL Intracatheter Q12H Rosalin Hawking, MD   10 mL at 02/16/20 2211  . sodium chloride flush (NS) 0.9 % injection 10-40 mL  10-40 mL Intracatheter PRN Rosalin Hawking, MD        OBJECTIVE: Middle-aged white woman examined in recliner  Vitals:   02/17/20 0324 02/17/20 0745  BP: (!) 144/61 (!) 144/71  Pulse: 69 66  Resp: 18 17  Temp: 98 F (36.7 C) 98.7 F (37.1 C)  SpO2: 97% 96%     Body mass index is 26.73 kg/m.   Wt Readings from Last 3 Encounters:  02/15/20 146 lb 2.6 oz (66.3 kg)  08/15/14 149 lb 0.5 oz (67.6 kg)    LAB RESULTS:  CMP     Component Value Date/Time   NA 141 02/16/2020 1938   K 3.5 02/16/2020  1938   CL 111 02/16/2020 1938   CO2 22 02/16/2020 1938   GLUCOSE 129 (H) 02/16/2020 1938   BUN 13 02/16/2020 1938   CREATININE 0.80 02/16/2020 1938   CALCIUM 9.0 02/16/2020 1938   PROT 6.9 08/15/2014 1838   ALBUMIN 3.8 02/16/2020 1938   AST 18 08/15/2014 1838   ALT 21 08/15/2014 1838   ALKPHOS 70 08/15/2014 1838   BILITOT 0.9 08/15/2014  [place] 02/16/2020 1938   GFRAA >60 02/16/2020 1938    No results found for: TOTALPROTELP, ALBUMINELP, A1GS, A2GS, BETS, BETA2SER, GAMS, MSPIKE, SPEI  No results found for: KPAFRELGTCHN, LAMBDASER, KAPLAMBRATIO  Lab Results  Component Value Date   WBC 17.8 (H) 02/17/2020   NEUTROABS 14.7 (H) 02/17/2020   HGB 15.4 (H) 02/17/2020   HCT 47.7 (H) 02/17/2020   MCV 92.4 02/17/2020   PLT 669 (H) 02/17/2020    _0 @  No results found for: LABCA2  No components found for: [id number]  Recent Labs  Lab 02/14/20 0028  INR 1.1    No results found for: LABCA2  No results found for: BBU037  No results found for: QDU438  No results found for: VKF840  No results found for: CA2729  No components found for: HGQUANT  No results found for: CEA1 / No results found for: CEA1   No results found for: AFPTUMOR  No results found for: CHROMOGRNA  No results found for: PSA1  Admission on 02/13/2020  Component Date Value Ref Range Status  . Sodium 02/13/2020 138  135 - 145 mmol/L Final  . Potassium 02/13/2020 3.8  3.5 - 5.1 mmol/L Final  . Chloride 02/13/2020 105  98 - 111 mmol/L Final  . CO2 02/13/2020 22  22 - 32 mmol/L Final  . Glucose, Bld 02/13/2020 133* 70 - 99 mg/dL Final   Glucose reference range applies only to samples taken after fasting for at least 8 hours.  . BUN 02/13/2020 13  8 - 23 mg/dL Final  . Creatinine, Ser 02/13/2020 0.93  0.44 - 1.00 mg/dL Final  . Calcium 02/13/2020 9.5  8.9 - 10.3 mg/dL Final  . GFR calc non Af Amer 02/13/2020 >60  >60 mL/min Final  . GFR calc Af Amer 02/13/2020 >60  >60 mL/min  Final  . Anion gap 02/13/2020 11  5 - 15 Final   Performed at [facility], [place] [address], [place], [place] [address]  . WBC 02/13/2020 12.0* 4.0 - 10.5 K/uL Final  . RBC 02/13/2020 6.02* 3.87 - 5.11 MIL/uL Final  . Hemoglobin 02/13/2020 17.7* 12.0 - 15.0 g/dL Final  . HCT 02/13/2020 55.5* 36.0 - 46.0 % Final  . MCV 02/13/2020 92.2  80.0 - 100.0 fL Final  . MCH 02/13/2020 29.4  26.0 - 34.0 pg Final  . MCHC 02/13/2020 31.9  30.0 - 36.0 g/dL Final  . RDW 02/13/2020 14.3  11.5 - 15.5 % Final  . Platelets 02/13/2020 990* 150 - 400 K/uL Corrected   Comment: This critical result has verified and been called to C.CHIRSO,RN by Susy Manor on 04 04 2021 at 2032, and has been read back.  REPEATED TO VERIFY CORRECTED ON 04/05 AT 1113: PREVIOUSLY REPORTED AS 990 This critical result has verified and been called to C.CHIRSO,RN by Susy Manor on 04 04 2021 at 2032, and has been read back.    Marland Kitchen nRBC 02/13/2020 0.0  0.0 - 0.2 % Final   Performed at [facility], [place] [address], [place], [place] [address]  . Prothrombin Time 02/14/2020 13.6  11.4 - 15.2 seconds Final  . INR 02/14/2020 1.1  0.8 - 1.2 Final   Comment: (NOTE) INR goal varies based on device and disease states. Performed at [facility], [place] [address] [place], [place] [address]   . aPTT 02/14/2020 40* 24 - 36 seconds Final   Comment:        IF BASELINE  aPTT IS ELEVATED, SUGGEST PATIENT RISK ASSESSMENT BE USED TO DETERMINE APPROPRIATE ANTICOAGULANT THERAPY. Performed at [facility], [place] [address], [place], [place] [address]   . SARS Coronavirus 2 02/14/2020 NEGATIVE  NEGATIVE Final   Comment: (NOTE) SARS-CoV-2 target nucleic acids are NOT DETECTED. The SARS-CoV-2 RNA is generally detectable in upper and lower respiratory specimens during the acute phase of infection. Negative results do not preclude SARS-CoV-2 infection, do not rule out co-infections with other pathogens, and should not be used  as the sole basis for treatment or other patient management decisions. Negative results must be combined with clinical observations, patient history, and epidemiological information. The expected result is Negative. Fact Sheet for Patients: SugarRoll.be Fact Sheet for Healthcare Providers: https://www.woods-mathews.com/ This test is not yet approved or cleared by the [place] FDA and  has been authorized for detection and/or diagnosis of SARS-CoV-2 by FDA under an Emergency Use Authorization (EUA). This EUA will remain  in effect (meaning this test can be used) for the duration of the COVID-19 declaration under Section 56                          4(b)(1) of the Act, 21 U.S.C. section 360bbb-3(b)(1), unless the authorization is terminated or revoked sooner. Performed at [facility], [place] [address] [place], [place] [address]   . SARS Coronavirus 2 by RT PCR 02/14/2020 NEGATIVE  NEGATIVE Final   Comment: (NOTE) SARS-CoV-2 target nucleic acids are NOT DETECTED. The SARS-CoV-2 RNA is generally detectable in upper respiratoy specimens during the acute phase of infection. The lowest concentration of SARS-CoV-2 viral copies this assay can detect is 131 copies/mL. A negative result does not preclude SARS-Cov-2 infection and should not be used as the sole basis for treatment or other patient management decisions. A negative result may occur with  improper specimen collection/handling, submission of specimen other than nasopharyngeal swab, presence of viral mutation(s) within the areas targeted by this assay, and inadequate number of viral copies (<131 copies/mL). A negative result must be combined with clinical observations, patient history, and epidemiological information. The expected result is Negative. Fact Sheet for Patients:  PinkCheek.be Fact Sheet for Healthcare Providers:    GravelBags.it This test is not yet ap                          proved or cleared by the [place] FDA and  has been authorized for detection and/or diagnosis of SARS-CoV-2 by FDA under an Emergency Use Authorization (EUA). This EUA will remain  in effect (meaning this test can be used) for the duration of the COVID-19 declaration under Section 564(b)(1) of the Act, 21 U.S.C. section 360bbb-3(b)(1), unless the authorization is terminated or revoked sooner.   . Influenza A by PCR 02/14/2020 NEGATIVE  NEGATIVE Final  . Influenza B by PCR 02/14/2020 NEGATIVE  NEGATIVE Final   Comment: (NOTE) The Xpert Xpress SARS-CoV-2/FLU/RSV assay is intended as an aid in  the diagnosis of influenza from Nasopharyngeal swab specimens and  should not be used as a sole basis for treatment. Nasal washings and  aspirates are unacceptable for Xpert Xpress SARS-CoV-2/FLU/RSV  testing. Fact Sheet for Patients: PinkCheek.be Fact Sheet for Healthcare Providers: GravelBags.it This test is not yet approved or cleared by the [place] FDA and  has been authorized for detection and/or diagnosis of SARS-CoV-2 by  FDA under an Emergency Use Authorization (EUA). This EUA  will remain  in effect (meaning this test can be used) for the duration of the  Covid-19 declaration under Section 564(b)(1) of the Act, 21  U.S.C. section 360bbb-3(b)(1), unless the authorization is  terminated or revoked. Performed at [facility], [place] [address], [place], [place] [address]   . MRSA by PCR 02/14/2020 NEGATIVE  NEGATIVE Final   Comment:        The GeneXpert MRSA Assay (FDA approved for NASAL specimens only), is one component of a comprehensive MRSA colonization surveillance program. It is not intended to diagnose MRSA infection nor to guide or monitor treatment for MRSA infections. Performed at [facility], [place] [address] [place], [place] [address]   . Uric Acid, Serum 02/14/2020 5.4  2.5 - 7.1 mg/dL Final   Performed at [facility], [place] [address] [place], [place] [address]  . Erythropoietin 02/14/2020 1.3* 2.6 - 18.5 mIU/mL Final   Comment: (NOTE) Beckman Coulter UniCel DxI Cannon obtained with different assay methods or kits cannot be used interchangeably. Results cannot be interpreted as absolute evidence of the presence or absence of malignant disease. Performed At: [facility] [place], [place] [id number] Rush Farmer MD [id number]   . Coagulation Factor VIII 02/14/2020 142* 56 - 140 % Final  . Ristocetin Co-factor, Plasma 02/14/2020 31* 50 - 200 % Final   Comment: (NOTE) The VWF:RCo assay demonstrates significant variability and slightly low values are commonly seen due to preanalytical and analytical variables.  VWF:RCo may be spuriously low in individuals with certain polymorphisms in the VWF gene (e.g. [id number]) that alter the binding of ristocetin to VWF.  These polymorphisms have been reported in 17% of whites and 63% of African Americans without a history of a bleeding disorder (Blood.  2010; 116(2):280-286). Performed At: [facility] [place], [place] [id number] Rush Farmer MD [id number]   . Von Willebrand Antigen, Plasma 02/14/2020 121  50 - 200 % Final   Comment: (NOTE) This test was developed and its performance characteristics determined by [facility]. It has not been cleared or approved by the Food and Drug Administration.   . Ferritin 02/14/2020 75  11 - 307 ng/mL Final   Performed at [facility], [place] [address], [place], [place] [address]  . Iron 02/14/2020 73  28 - 170 ug/dL Final  . TIBC 02/14/2020 358  250 - 450 ug/dL Final  . Saturation Ratios 02/14/2020 20  10.4 - 31.8 % Final  . UIBC 02/14/2020 285  ug/dL Final   Performed at [facility], [place] [address],  [place], [place] [address]  . Weight 02/14/2020 2,480  oz Final  . Height 02/14/2020 62  in Final  . BP 02/14/2020 133/70  mmHg Final  . Hgb A1c MFr Bld 02/14/2020 6.1* 4.8 - 5.6 % Final   Comment: (NOTE) Pre diabetes:          5.7%-6.4% Diabetes:              >6.4% Glycemic control for   <7.0% adults with diabetes   . Mean Plasma Glucose 02/14/2020 128.37  mg/dL Final   Performed at [place] [address] [place], [place] [address]  . Vitamin B-12 02/14/2020 248  180 - 914 pg/mL Final   Comment: (NOTE) This assay is not validated for testing neonatal or myeloproliferative syndrome specimens for Vitamin B12 levels. Performed at [facility], [place]  [address] [place] [address], [place], [place] [address]   . TSH 02/14/2020 1.581  0.350 - 4.500 uIU/mL Final   Comment: Performed by a 3rd Generation assay with a functional sensitivity of <=0.01 uIU/mL. Performed at [facility], [place] [address] [place], [place] [address]   . Path Review 02/13/2020 Thrombocytosis.  Reactive appearing lymphocytes.   Final   Comment: Reviewed by Lennox Solders. Lyndon Code, M.D. 02/14/2020. Performed at [facility], [place] [address] [place], [place] [address]   . Cholesterol 02/14/2020 172  0 - 200 mg/dL Final  . Triglycerides 02/14/2020 46  <150 mg/dL Final  . HDL 02/14/2020 71  >40 mg/dL Final  . Total CHOL/HDL Ratio 02/14/2020 2.4  RATIO Final  . VLDL 02/14/2020 9  0 - 40 mg/dL Final  . LDL Cholesterol 02/14/2020 92  0 - 99 mg/dL Final   Comment:        Total Cholesterol/HDL:CHD Risk Coronary Heart Disease Risk Table                     Men   Women  1/2 Average Risk   3.4   3.3  Average Risk       5.0   4.4  2 X Average Risk   9.6   7.1  3 X Average Risk  23.4   11.0        Use the calculated Patient Ratio above and the CHD Risk Table to determine the patient's CHD Risk.        ATP III CLASSIFICATION (LDL):  <100     mg/dL   Optimal  100-129  mg/dL   Near or Above                    Optimal   130-159  mg/dL   Borderline  160-189  mg/dL   High  >190     mg/dL   Very High Performed at [place] [address] [place], [place] [address]   . Sodium 02/14/2020 141  135 - 145 mmol/L Final  . Potassium 02/14/2020 3.7  3.5 - 5.1 mmol/L Final  . Chloride 02/14/2020 105  98 - 111 mmol/L Final  . CO2 02/14/2020 23  22 - 32 mmol/L Final  . Glucose, Bld 02/14/2020 129* 70 - 99 mg/dL Final   Glucose reference range applies only to samples taken after fasting for at least 8 hours.  . BUN 02/14/2020 18  8 - 23 mg/dL Final  . Creatinine, Ser 02/14/2020 0.94  0.44 - 1.00 mg/dL Final  . Calcium 02/14/2020 9.7  8.9 - 10.3 mg/dL Final  . GFR calc non Af Amer 02/14/2020 >60  >60 mL/min Final  . GFR calc Af Amer 02/14/2020 >60  >60 mL/min Final  . Anion gap 02/14/2020 13  5 - 15 Final   Performed at [facility], [place] [address], [place],  [address]  . WBC 02/15/2020 24.4* 4.0 - 10.5 K/uL Final  . RBC 02/15/2020 6.33* 3.87 - 5.11 MIL/uL Final  . Hemoglobin 02/15/2020 18.6* 12.0 - 15.0 g/dL Final   REPEATED TO VERIFY  . HCT 02/15/2020 58.5* 36.0 - 46.0 % Final   REPEATED TO VERIFY  . MCV 02/15/2020 92.4  80.0 - 100.0 fL Final  . MCH 02/15/2020 29.4  26.0 - 34.0 pg Final  . MCHC 02/15/2020 31.8  30.0 - 36.0 g/dL Final  . RDW 02/15/2020 15.1  11.5 - 15.5 % Final  . Platelets  02/15/2020 738* 150 - 400 K/uL Final  . nRBC 02/15/2020 0.0  0.0 - 0.2 % Final  . Neutrophils Relative % 02/15/2020 88  % Final  . Neutro Abs 02/15/2020 21.3* 1.7 - 7.7 K/uL Final  . Lymphocytes Relative 02/15/2020 5  % Final  . Lymphs Abs 02/15/2020 1.2  0.7 - 4.0 K/uL Final  . Monocytes Relative 02/15/2020 6  % Final  . Monocytes Absolute 02/15/2020 1.5* 0.1 - 1.0 K/uL Final  . Eosinophils Relative 02/15/2020 0  % Final  . Eosinophils Absolute 02/15/2020 0.0  0.0 - 0.5 K/uL Final  . Basophils Relative 02/15/2020 0  % Final  . Basophils Absolute 02/15/2020 0.1  0.0 - 0.1 K/uL Final  . Immature  Granulocytes 02/15/2020 1  % Final  . Abs Immature Granulocytes 02/15/2020 0.27* 0.00 - 0.07 K/uL Final   Performed at [facility], [place] [address] [place], [place] [address]  . Interpretation 02/14/2020 Note   Final   Comment: (NOTE) ------------------------------- COAGULATION: VON WILLEBRAND FACTOR ASSESSMENT CURRENT RESULTS ASSESSMENT The VWF:Ag is normal. The VWF:RCo is slightly decreased. The FVIII is elevated. VON WILLEBRAND FACTOR ASSESSMENT CURRENT RESULTS INTERPRETATION - Although the results are abnormal, the panel does not meet the laboratory criteria for VWD. The 2008 NHLBI VWD guideline (summary in Am J Hematol. 2009; 84(6):366-370) suggests that a diagnosis of VWD (excluding type 2N) be reserved for patients with VWF levels that fall below 30%. Results in the range of 30 to 50% may be associated with an increased risk for bleeding, and do not preclude a VWD diagnosis if supporting clinical history is present, nor preclude therapy to elevate VWF levels with a clinical bleeding risk. VWF:RCo/VWF:Ag ratio is less than 0.7 and for this reason type 2 VWD, including type 2A, 2B, and 49M, or platelet-type VWD cannot be excluded. Distinction between type 1 and type 2 VWD should not b                          e made on VWF:RCo/VWF:Ag ratios only as there may be significant overlap between type 1 and type 2 VWD especially when ratios are between 0.5 and 0.7. In an older population, with recent onset of bleeding diathesis, acquired von Willebrand syndrome is more likely than congenital deficiency. Possible etiologies include hypothyroidism, lymphoproliferative disorders, myeloproliferative disorders and certain cardiac conditions associated with increased shear stress. This battery of assays does not distinguish acquired from congenital VWD. Persistently elevated FVIII activity is a risk factor for venous thrombosis as well as recurrence of venous thrombosis. Risk is  graded and increases with the degree of elevation. Although elevated FVIII activity has been identified to cluster within families, a genetic basis for the elevation has not yet been elucidated (Br J Haematol. 2012; 157(6):653-663). VON WILLEBRAND FACTOR ASSESSMENT - The VWF:RCo assay demonstrates significant v                          ariability and falsely low values are commonly seen due to preanalytical and analytical variables. VWF:RCo may be spuriously low in individuals with polymorphisms (reported in 17% of whites and 63% of African Americans) in the VWF gene (clinical testing not available) that alter the binding of ristocetin to VWF. (Blood. 2010; 116(2):280-286). VWF activity can also be measured using a collagen binding assay, which is labeled for research use only, but does not show interference due to these polymorphisms. Results may be  falsely elevated and possibly falsely normal as VWF and FVIII may increase in samples drawn from patients (particularly children) who are visibly stressed at the time of phlebotomy, as acute phase reactants, or in response to certain drug therapies such as desmopressin. Repeat testing may be necessary before excluding a diagnosis of VWD especially if the clinical suspicion is high for an underlying bleeding disorder. The setting for phlebotomy should be                           as calm as possible and patients should be encouraged to sit quietly prior to the blood draw. VON WILLEBRAND FACTOR ASSESSMENT FURTHER CONSIDERATIONS - Consider repeat analysis on a new plasma sample to re-evaluate this pattern of results. If this pattern of results persists, further analysis will be needed to characterize VWD type. VON WILLEBRAND FACTOR ASSESSMENT DEFINITIONS - VWD - von Willebrand disease; VWF - von Willebrand factor; VWF:Ag - VWF antigen; VWF:RCo - VWF ristocetin cofactor activity; FVIII - factor VIII activity. - MEDICAL DIRECTOR: For  questions regarding panel interpretation, please contact Jake Bathe, M.D. at [facility] at [phone]. ------------------------------- DISCLAIMER These assessments and interpretations are provided as a convenience in support of the physician-patient relationship and are not intended to replace the physician's clinical judgment. They are derived from national guidelines in addition to ot                          her evidence and expert opinion. The clinician should consider this information within the context of clinical opinion and the individual patient. SEE GUIDANCE FOR VON WILLEBRAND FACTOR ASSESSMENT: (1) The National Heart, Lung and Blood Institute. The Diagnosis, Evaluation and Management of von Willebrand Disease. Janeal Holmes, MD: [place] Publication 05-3709. 6269. Available at vSpecials.com.pt. (2) Daryl Eastern et al. [place] J Hematol. 2009; 84(6):366-370. (3) [place]. 2004;10(3):199-217. (4) Pasi KJ et al. Haemophilia. 2004; 10(3):218-231. Performed At: [facility] [place], [place] [id number] Thomasene Ripple MD JK:[id number]   . WBC 02/16/2020 17.9* 4.0 - 10.5 K/uL Final   REPEATED TO VERIFY  . RBC 02/16/2020 5.42* 3.87 - 5.11 MIL/uL Final  . Hemoglobin 02/16/2020 16.2* 12.0 - 15.0 g/dL Final   REPEATED TO VERIFY  . HCT 02/16/2020 51.2* 36.0 - 46.0 % Final  . MCV 02/16/2020 94.5  80.0 - 100.0 fL Final  . MCH 02/16/2020 29.9  26.0 - 34.0 pg Final  . MCHC 02/16/2020 31.6  30.0 - 36.0 g/dL Final  . RDW 02/16/2020 14.7  11.5 - 15.5 % Final  . Platelets 02/16/2020 691* 150 - 400 K/uL Final   REPEATED TO VERIFY  . nRBC 02/16/2020 0.0  0.0 - 0.2 % Final  . Neutrophils Relative % 02/16/2020 81  % Final  . Neutro Abs 02/16/2020 14.8* 1.7 - 7.7 K/uL Final  . Lymphocytes Relative 02/16/2020 8  % Final  . Lymphs Abs 02/16/2020 1.4  0.7 - 4.0 K/uL Final  . Monocytes  Relative 02/16/2020 9  % Final  . Monocytes Absolute 02/16/2020 1.6* 0.1 - 1.0 K/uL Final  . Eosinophils Relative 02/16/2020 0  % Final  . Eosinophils Absolute 02/16/2020 0.0  0.0 - 0.5 K/uL Final  . Basophils Relative 02/16/2020 1  % Final  . Basophils Absolute 02/16/2020 0.1  0.0 - 0.1 K/uL Final  . Immature Granulocytes 02/16/2020 1  % Final  .  Abs Immature Granulocytes 02/16/2020 0.10* 0.00 - 0.07 K/uL Final   Performed at [facility], [place] [address], [place], [place] [address]  . Sodium 02/16/2020 142  135 - 145 mmol/L Final  . Potassium 02/16/2020 3.5  3.5 - 5.1 mmol/L Final  . Chloride 02/16/2020 108  98 - 111 mmol/L Final  . CO2 02/16/2020 24  22 - 32 mmol/L Final  . Glucose, Bld 02/16/2020 134* 70 - 99 mg/dL Final   Glucose reference range applies only to samples taken after fasting for at least 8 hours.  . BUN 02/16/2020 15  8 - 23 mg/dL Final  . Creatinine, Ser 02/16/2020 0.77  0.44 - 1.00 mg/dL Final  . Calcium 02/16/2020 8.5* 8.9 - 10.3 mg/dL Final  . GFR calc non Af Amer 02/16/2020 >60  >60 mL/min Final  . GFR calc Af Amer 02/16/2020 >60  >60 mL/min Final  . Anion gap 02/16/2020 10  5 - 15 Final   Performed at [facility], [place] [address] [place], [place] [address]  . Sodium 02/16/2020 143  135 - 145 mmol/L Final  . Potassium 02/16/2020 3.3* 3.5 - 5.1 mmol/L Final  . Chloride 02/16/2020 104  98 - 111 mmol/L Final  . BUN 02/16/2020 16  8 - 23 mg/dL Final  . Creatinine, Ser 02/16/2020 3.50* 0.44 - 1.00 mg/dL Final  . Glucose, Bld 02/16/2020 143* 70 - 99 mg/dL Final   Glucose reference range applies only to samples taken after fasting for at least 8 hours.  . Calcium, Ion 02/16/2020 1.24  1.15 - 1.40 mmol/L Final  . TCO2 02/16/2020 27  22 - 32 mmol/L Final  . Hemoglobin 02/16/2020 16.0* 12.0 - 15.0 g/dL Final  . HCT 02/16/2020 47.0* 36.0 - 46.0 % Final  . Sodium 02/16/2020 141  135 - 145 mmol/L Final  . Potassium 02/16/2020 3.5  3.5 - 5.1 mmol/L Final  .  Chloride 02/16/2020 111  98 - 111 mmol/L Final  . CO2 02/16/2020 22  22 - 32 mmol/L Final  . Glucose, Bld 02/16/2020 129* 70 - 99 mg/dL Final   Glucose reference range applies only to samples taken after fasting for at least 8 hours.  . BUN 02/16/2020 13  8 - 23 mg/dL Final  . Creatinine, Ser 02/16/2020 0.80  0.44 - 1.00 mg/dL Final   DELTA CHECK NOTED  . Calcium 02/16/2020 9.0  8.9 - 10.3 mg/dL Final  . Phosphorus 02/16/2020 2.3* 2.5 - 4.6 mg/dL Final  . Albumin 02/16/2020 3.8  3.5 - 5.0 g/dL Final  . GFR calc non Af Amer 02/16/2020 >60  >60 mL/min Final  . GFR calc Af Amer 02/16/2020 >60  >60 mL/min Final  . Anion gap 02/16/2020 8  5 - 15 Final   Performed at [facility], [place] [address], [place], [place] [address]  . WBC 02/17/2020 17.8* 4.0 - 10.5 K/uL Final  . RBC 02/17/2020 5.16* 3.87 - 5.11 MIL/uL Final  . Hemoglobin 02/17/2020 15.4* 12.0 - 15.0 g/dL Final  . HCT 02/17/2020 47.7* 36.0 - 46.0 % Final  . MCV 02/17/2020 92.4  80.0 - 100.0 fL Final  . MCH 02/17/2020 29.8  26.0 - 34.0 pg Final  . MCHC 02/17/2020 32.3  30.0 - 36.0 g/dL Final  . RDW 02/17/2020 14.3  11.5 - 15.5 % Final  . Platelets 02/17/2020 669* 150 - 400 K/uL Final  . nRBC 02/17/2020 0.0  0.0 - 0.2 % Final  . Neutrophils Relative %  02/17/2020 82  % Final  . Neutro Abs 02/17/2020 14.7* 1.7 - 7.7 K/uL Final  . Lymphocytes Relative 02/17/2020 9  % Final  . Lymphs Abs 02/17/2020 1.6  0.7 - 4.0 K/uL Final  . Monocytes Relative 02/17/2020 7  % Final  . Monocytes Absolute 02/17/2020 1.3* 0.1 - 1.0 K/uL Final  . Eosinophils Relative 02/17/2020 0  % Final  . Eosinophils Absolute 02/17/2020 0.0  0.0 - 0.5 K/uL Final  . Basophils Relative 02/17/2020 1  % Final  . Basophils Absolute 02/17/2020 0.1  0.0 - 0.1 K/uL Final  . Immature Granulocytes 02/17/2020 1  % Final  . Abs Immature Granulocytes 02/17/2020 0.12* 0.00 - 0.07 K/uL Final   Performed at [facility], [place] [address] [place], [place] [address]  .  Uric Acid, Serum 02/17/2020 3.2  2.5 - 7.1 mg/dL Final   Performed at [facility], [place] [address] [place], [place] [address]    (this displays the last labs from the last 3 days)  No results found for: TOTALPROTELP, ALBUMINELP, A1GS, A2GS, BETS, BETA2SER, GAMS, MSPIKE, SPEI (this displays SPEP labs)  No results found for: KPAFRELGTCHN, LAMBDASER, KAPLAMBRATIO (kappa/lambda light chains)  No results found for: HGBA, HGBA2QUANT, HGBFQUANT, HGBSQUAN (Hemoglobinopathy evaluation)   No results found for: LDH  Lab Results  Component Value Date   IRON 73 02/14/2020   TIBC 358 02/14/2020   IRONPCTSAT 20 02/14/2020   (Iron and TIBC)  Lab Results  Component Value Date   FERRITIN 75 02/14/2020    Urinalysis    Component Value Date/Time   COLORURINE YELLOW 08/15/2014 2129   APPEARANCEUR CLEAR 08/15/2014 2129   LABSPEC 1.013 08/15/2014 2129   PHURINE 6.0 08/15/2014 2129   [place] 08/15/2014 2129   [place] NEGATIVE 08/15/2014 2129   BILIRUBINUR NEGATIVE 08/15/2014 2129   KETONESUR 15 (A) 08/15/2014 2129   PROTEINUR NEGATIVE 08/15/2014 2129   UROBILINOGEN 0.2 08/15/2014 2129   NITRITE NEGATIVE 08/15/2014 2129   LEUKOCYTESUR NEGATIVE 08/15/2014 2129     STUDIES: CT HEAD WO CONTRAST  Result Date: 02/16/2020 CLINICAL DATA:  Intracranial hemorrhage, follow-up EXAM: CT HEAD WITHOUT CONTRAST TECHNIQUE: Contiguous axial images were obtained from the base of the skull through the vertex without intravenous contrast. COMPARISON:  02/14/2020 FINDINGS: Brain: Intraventricular hemorrhage is again identified primarily within the right lateral ventricle. There is no new hemorrhage. Caliber of the ventricles is similar. No new loss of gray differentiation. Confluent hypoattenuation is again identified supratentorial white matter. Vascular: No new finding. Skull: Remains unremarkable. Sinuses/Orbits: No acute finding. Other: None. IMPRESSION: No substantial change in  intraventricular hemorrhage primarily within the right lateral ventricle. No new hemorrhage. Stable caliber of the ventricles. Electronically Signed   By: Macy Mis M.D.   On: 02/16/2020 07:09   CT HEAD WO CONTRAST  Result Date: 02/14/2020 CLINICAL DATA:  Follow-up hemorrhage EXAM: CT HEAD WITHOUT CONTRAST TECHNIQUE: Contiguous axial images were obtained from the base of the skull through the vertex without intravenous contrast. COMPARISON:  Earlier same day, 8 hours ago. FINDINGS: Brain: Intraventricular hemorrhage within the right lateral ventricle persists, approximately the same volume without evidence of ongoing or additional bleeding. Small amount of blood present within the fourth ventricle and left lateral ventricle. Small amount of subarachnoid blood visible within a left parietal sulcus. No overall change in ventricular size. Widespread chronic microangiopathic change of the white matter as seen previously. Old small vessel infarction in the right basal ganglia. No sign of acute infarction.  I do not identify an intraparenchymal source. Vascular: There is atherosclerotic calcification of the major vessels at the base of the brain. Skull: Negative Sinuses/Orbits: Clear/normal Other: None IMPRESSION: No significant change since 8 hours ago. Intraventricular hemorrhage primarily within the right lateral ventricle. No evidence of ongoing bleeding. Small amount of blood dependent within the left lateral ventricle and within the fourth ventricle. Ventricular size is stable. Small amount of subarachnoid blood evident in a left parietal sulcus. Electronically Signed   By: Nelson Chimes M.D.   On: 02/14/2020 08:57   CT Head Wo Contrast  Result Date: 02/14/2020 CLINICAL DATA:  Acute onset headache EXAM: CT HEAD WITHOUT CONTRAST TECHNIQUE: Contiguous axial images were obtained from the base of the skull through the vertex without intravenous contrast. COMPARISON:  None. FINDINGS: Brain: There is acute  hemorrhage within the right lateral ventricle. No definite intraparenchymal component is identified. There is severe chronic white matter disease, most commonly indicating chronic ischemic microangiopathy. Volume of CSF spaces is normal. No midline shift or other mass effect Vascular: No abnormal hyperdensity of the major intracranial arteries or dural venous sinuses. No intracranial atherosclerosis. Skull: The visualized skull base, calvarium and extracranial soft tissues are normal. Sinuses/Orbits: No fluid levels or advanced mucosal thickening of the visualized paranasal sinuses. No mastoid or middle ear effusion. The orbits are normal. IMPRESSION: 1. Acute intraventricular hemorrhage within the right lateral ventricle. No definite parenchymal component identified. This is an unusual pattern, but is perhaps less unlikely in the setting of this patient's abnormal coagulation status. 2. Severe chronic ischemic microangiopathy. Critical Value/emergent results were called by telephone at the time of interpretation on 02/14/2020 at 12:31 am to provider DAVID [facility] , who verbally acknowledged these results. Electronically Signed   By: Ulyses Jarred M.D.   On: 02/14/2020 00:32   MR ANGIO HEAD WO CONTRAST  Result Date: 02/14/2020 CLINICAL DATA:  Follow-up cerebral hemorrhage. EXAM: MRI HEAD WITHOUT CONTRAST MRA HEAD WITHOUT CONTRAST TECHNIQUE: Multiplanar, multiecho pulse sequences of the brain and surrounding structures were obtained without intravenous contrast. Angiographic images of the head were obtained using MRA technique without contrast. COMPARISON:  Head CT February 14, 2020 6 FINDINGS: MRI HEAD FINDINGS Brain: Redemonstrated right intraventricular hemorrhage with extension into the third and fourth ventricle as well as the occipital horn of left lateral ventricle. The ventricular size remains stable. No midline shift. A loculated fluid collection measuring approximately 1 cm with hematocrit level is noted in the  left parietal region at midline (series 11, image 23). Extensive confluent foci of T2 hyperintensity are seen within the white matter of the cerebral hemispheres, basal ganglia, thalami and pons, nonspecific. Scattered punctate foci of susceptibility artifact are seen the bilateral cerebral hemispheres predominantly in subcortical location, bilateral thalami, pons and cerebellar hemispheres, likely representing hemosiderin deposit. This appears progressed since prior MRI. Vascular: Normal flow voids. Skull and upper cervical spine: Normal marrow signal. Sinuses/Orbits: Mild mucosal thickening of the right maxillary sinus. The orbits are maintained. Other: None. MRA HEAD FINDINGS The visualized portions of the distal cervical and intracranial internal carotid arteries are widely patent with normal flow related enhancement. The bilateral anterior cerebral arteries and middle cerebral arteries are widely patent with antegrade flow without high-grade flow-limiting stenosis or proximal branch occlusion. No intracranial aneurysm within the anterior circulation. The vertebral arteries are widely patent with antegrade flow. The posterior inferior cerebral arteries are normal. Redemonstrated is a fenestration of the bilateral A1 segments. Vertebrobasilar junction and basilar artery are widely patent with  antegrade flow without evidence of basilar stenosis or aneurysm. Posterior cerebral arteries are normal bilaterally. No intracranial aneurysm within the posterior circulation. IMPRESSION: 1. Stable right intraventricular hemorrhage with extension into the third and fourth ventricle. Stable ventricular size. No evidence of hydrocephalus. 2. Extensive confluent T2 hyperintensity within the white matter of the cerebral hemispheres, basal ganglia, thalami, and pons, nonspecific but may be related to chronic small vessel ischemic changes. 3. Multiple foci of susceptibility artifact throughout the brain parenchyma, progressed from  prior MRI. These may be related to microangiopathy versus amyloid angiopathy. Electronically Signed   By: Pedro Earls M.D.   On: 02/14/2020 14:54   MR BRAIN WO CONTRAST  Result Date: 02/14/2020 CLINICAL DATA:  Follow-up cerebral hemorrhage. EXAM: MRI HEAD WITHOUT CONTRAST MRA HEAD WITHOUT CONTRAST TECHNIQUE: Multiplanar, multiecho pulse sequences of the brain and surrounding structures were obtained without intravenous contrast. Angiographic images of the head were obtained using MRA technique without contrast. COMPARISON:  Head CT February 14, 2020 6 FINDINGS: MRI HEAD FINDINGS Brain: Redemonstrated right intraventricular hemorrhage with extension into the third and fourth ventricle as well as the occipital horn of left lateral ventricle. The ventricular size remains stable. No midline shift. A loculated fluid collection measuring approximately 1 cm with hematocrit level is noted in the left parietal region at midline (series 11, image 23). Extensive confluent foci of T2 hyperintensity are seen within the white matter of the cerebral hemispheres, basal ganglia, thalami and pons, nonspecific. Scattered punctate foci of susceptibility artifact are seen the bilateral cerebral hemispheres predominantly in subcortical location, bilateral thalami, pons and cerebellar hemispheres, likely representing hemosiderin deposit. This appears progressed since prior MRI. Vascular: Normal flow voids. Skull and upper cervical spine: Normal marrow signal. Sinuses/Orbits: Mild mucosal thickening of the right maxillary sinus. The orbits are maintained. Other: None. MRA HEAD FINDINGS The visualized portions of the distal cervical and intracranial internal carotid arteries are widely patent with normal flow related enhancement. The bilateral anterior cerebral arteries and middle cerebral arteries are widely patent with antegrade flow without high-grade flow-limiting stenosis or proximal branch occlusion. No intracranial  aneurysm within the anterior circulation. The vertebral arteries are widely patent with antegrade flow. The posterior inferior cerebral arteries are normal. Redemonstrated is a fenestration of the bilateral A1 segments. Vertebrobasilar junction and basilar artery are widely patent with antegrade flow without evidence of basilar stenosis or aneurysm. Posterior cerebral arteries are normal bilaterally. No intracranial aneurysm within the posterior circulation. IMPRESSION: 1. Stable right intraventricular hemorrhage with extension into the third and fourth ventricle. Stable ventricular size. No evidence of hydrocephalus. 2. Extensive confluent T2 hyperintensity within the white matter of the cerebral hemispheres, basal ganglia, thalami, and pons, nonspecific but may be related to chronic small vessel ischemic changes. 3. Multiple foci of susceptibility artifact throughout the brain parenchyma, progressed from prior MRI. These may be related to microangiopathy versus amyloid angiopathy. Electronically Signed   By: Pedro Earls M.D.   On: 02/14/2020 14:54   DG CHEST PORT 1 VIEW  Result Date: 02/14/2020 CLINICAL DATA:  Status post PICC placement EXAM: PORTABLE CHEST 1 VIEW COMPARISON:  August 15, 2014 FINDINGS: The heart size and mediastinal contours are within normal limits. Aortic knob calcifications are seen. A right-sided central venous catheter seen with the tip at the superior cavoatrial junction. No pneumothorax. No pleural effusion. No acute osseous abnormality. IMPRESSION: Interval placement of right-sided central venous catheter with the tip at the superior cavoatrial junction. Electronically Signed   By:  Prudencio Pair M.D.   On: 02/14/2020 13:09   CT BONE MARROW BIOPSY & ASPIRATION  Result Date: 02/15/2020 CLINICAL DATA:  Polycythemia EXAM: CT GUIDED DEEP ILIAC BONE ASPIRATION AND CORE BIOPSY TECHNIQUE: Patient was placed prone on the CT gantry and limited axial scans through the pelvis  were obtained. Appropriate skin entry site was identified. Skin site was marked, prepped with chlorhexidine, draped in usual sterile fashion, and infiltrated locally with 1% lidocaine. Intravenous Fentanyl 64mg and Versed 146mwere administered as conscious sedation during continuous monitoring of the patient's level of consciousness and physiological / cardiorespiratory status by the radiology RN, with a total moderate sedation time of 12 minutes. Under CT fluoroscopic guidance an 11-gauge Cook trocar bone needle was advanced into the right iliac bone just lateral to the sacroiliac joint. Once needle tip position was confirmed, core and aspiration samples were obtained, submitted to pathology for approval. Post procedure scans show no hematoma or fracture. Patient tolerated procedure well. COMPLICATIONS: COMPLICATIONS none IMPRESSION: 1. Technically successful CT guided right iliac bone core and aspiration biopsy. Electronically Signed   By: D Lucrezia Europe.D.   On: 02/15/2020 15:11   ECHOCARDIOGRAM COMPLETE  Result Date: 02/14/2020    ECHOCARDIOGRAM REPORT   Patient Name:   Victoria HOGREFEate of Exam: 02/14/2020 Medical Rec #:  [id number]      Height:       62.0 in Accession #:    [id number]     Weight:       155.0 lb Date of Birth:  [birth year]     BSA:          1.715 m Patient Age:    [age]         BP:           121/56 mmHg Patient Gender: F                HR:           90 bpm. Exam Location:  Inpatient Procedure: 2D Echo Indications:    stroke 434.91  History:        Patient has prior history of Echocardiogram examinations, most                 recent 08/16/2014. Risk Factors:Hypertension and Dyslipidemia.  Sonographer:    LaJohny Chesseferring Phys: [id number] GroveU IMPRESSIONS  1. Left ventricular ejection fraction, by estimation, is 65 to 70%. The left ventricle has normal function. The left ventricle has no regional wall motion abnormalities. Left ventricular diastolic parameters were  normal.  2. Right ventricular systolic function is normal. The right ventricular size is normal. Tricuspid regurgitation signal is inadequate for assessing PA pressure.  3. The mitral valve is grossly normal. No evidence of mitral valve regurgitation. No evidence of mitral stenosis.  4. The aortic valve is tricuspid. Aortic valve regurgitation is not visualized. Mild aortic valve sclerosis is present, with no evidence of aortic valve stenosis.  5. The inferior vena cava is normal in size with greater than 50% respiratory variability, suggesting right atrial pressure of 3 mmHg. Conclusion(s)/Recommendation(s): No intracardiac source of embolism detected on this transthoracic study. A transesophageal echocardiogram is recommended to exclude cardiac source of embolism if clinically indicated. FINDINGS  Left Ventricle: Left ventricular ejection fraction, by estimation, is 65 to 70%. The left ventricle has normal function. The left ventricle has no regional wall motion abnormalities. The left ventricular internal cavity size was normal  in size. There is  no left ventricular hypertrophy. Left ventricular diastolic parameters were normal. Right Ventricle: The right ventricular size is normal. No increase in right ventricular wall thickness. Right ventricular systolic function is normal. Tricuspid regurgitation signal is inadequate for assessing PA pressure. Left Atrium: Left atrial size was normal in size. Right Atrium: Right atrial size was normal in size. Pericardium: There is no evidence of pericardial effusion. Presence of pericardial fat pad. Mitral Valve: The mitral valve is grossly normal. Mild mitral annular calcification. No evidence of mitral valve regurgitation. No evidence of mitral valve stenosis. Tricuspid Valve: The tricuspid valve is grossly normal. Tricuspid valve regurgitation is not demonstrated. No evidence of tricuspid stenosis. Aortic Valve: The aortic valve is tricuspid. Aortic valve regurgitation is  not visualized. Mild aortic valve sclerosis is present, with no evidence of aortic valve stenosis. Pulmonic Valve: The pulmonic valve was grossly normal. Pulmonic valve regurgitation is not visualized. No evidence of pulmonic stenosis. Aorta: The aortic root is normal in size and structure. Venous: The inferior vena cava is normal in size with greater than 50% respiratory variability, suggesting right atrial pressure of 3 mmHg. IAS/Shunts: The atrial septum is grossly normal.  LEFT VENTRICLE PLAX 2D LVIDd:         4.40 cm  Diastology LVIDs:         2.70 cm  LV e' lateral:   9.46 cm/s LV PW:         0.90 cm  LV E/e' lateral: 8.6 LV IVS:        0.70 cm  LV e' medial:    8.05 cm/s LVOT diam:     2.00 cm  LV E/e' medial:  10.1 LV SV:         74 LV SV Index:   43 LVOT Area:     3.14 cm  RIGHT VENTRICLE RV S prime:     17.10 cm/s TAPSE (M-mode): 2.5 cm LEFT ATRIUM             Index       RIGHT ATRIUM           Index LA diam:        3.40 cm 1.98 cm/m  RA Area:     11.40 cm LA Vol (A2C):   26.9 ml 15.68 ml/m RA Volume:   23.90 ml  13.93 ml/m LA Vol (A4C):   38.5 ml 22.44 ml/m LA Biplane Vol: 33.5 ml 19.53 ml/m  AORTIC VALVE LVOT Vmax:   111.00 cm/s LVOT Vmean:  70.600 cm/s LVOT VTI:    0.234 m MITRAL VALVE MV Area (PHT): 3.12 cm    SHUNTS MV Decel Time: 243 msec    Systemic VTI:  0.23 m MV E velocity: 81.00 cm/s  Systemic Diam: 2.00 cm MV A velocity: 99.80 cm/s MV E/A ratio:  0.81 Eleonore Chiquito MD Electronically signed by Eleonore Chiquito MD Signature Date/Time: 02/14/2020/4:33:15 PM    Final    [place] EKG SITE RITE  Result Date: 02/14/2020 If Site Rite image not attached, placement could not be confirmed due to current cardiac rhythm.   ELIGIBLE FOR AVAILABLE RESEARCH PROTOCOL: no  ASSESSMENT: 74 y.o. [place] woman presenting 02/13/2020 with headache, found to have a right lateral ventricle bleed, in the setting of panmyelosis, most c/w polycythemia vera.   (1) leukapheresis x3: 3d leukapheresis scheduled for  Friday 04/09 AM  (2) bone marrow biopsy 02/13/2020 shows no evidence of leukemia, c/w P Vera; additional confirmatory labs pending  (3)  rule out acquired von Willebrand disease: borderling low ristocetin cofactor; does not meet criteria for vWD; will repeat once patient in remission  (4) intraventricular bleed: amyloid angiopathy? Other?  PLAN: If possible would keep patient in house one more day-- she could then have pheresis Friday AM (orders written) and have the central line removed before returning home. I will arrange for weekly follow-up initially until P-Vera controlled and stable  Do you suggest a neurologist to follow the patient after discharge?  I will follow with you.  Chauncey Cruel, MD   02/17/2020 8:10 AM

## 2020-02-17 NOTE — Progress Notes (Signed)
STROKE TEAM PROGRESS NOTE   INTERVAL HISTORY Pt lying in bed, no family available. Pt stated that [name] HA slightly better than yesterday. She will have another leukophoresis tomorrow. As per Dr. Jana Duke, BM biopsy consistent with polycythemia vera. CBC improving.   Vitals:   02/16/20 1946 02/16/20 2332 02/17/20 0324 02/17/20 0745  BP: (!) 164/74 (!) 148/69 (!) 144/61 (!) 144/71  Pulse: 63 66 69 66  Resp: 18 18 18 17   Temp: 98.7 F (37.1 C) 98.3 F (36.8 C) 98 F (36.7 C) 98.7 F (37.1 C)  TempSrc: Oral Oral Oral Oral  SpO2: 97% 98% 97% 96%  Weight:      Height:       CBC:  Recent Labs  Lab 02/16/20 0355 02/16/20 0355 02/16/20 1329 02/17/20 0423  WBC 17.9*  --   --  17.8*  NEUTROABS 14.8*  --   --  14.7*  HGB 16.2*   < > 16.0* 15.4*  HCT 51.2*   < > 47.0* 47.7*  MCV 94.5  --   --  92.4  PLT 691*  --   --  669*   < > = values in this interval not displayed.   Basic Metabolic Panel:  Recent Labs  Lab 02/16/20 1029 02/16/20 1029 02/16/20 1329 02/16/20 1938  NA 142   < > 143 141  K 3.5   < > 3.3* 3.5  CL 108   < > 104 111  CO2 24  --   --  22  GLUCOSE 134*   < > 143* 129*  BUN 15   < > 16 13  CREATININE 0.77   < > 3.50* 0.80  CALCIUM 8.5*  --   --  9.0  PHOS  --   --   --  2.3*   < > = values in this interval not displayed.   Lipid Panel:     Component Value Date/Time   CHOL 172 02/14/2020 0959   TRIG 46 02/14/2020 0959   HDL 71 02/14/2020 0959   CHOLHDL 2.4 02/14/2020 0959   VLDL 9 02/14/2020 0959   LDLCALC 92 02/14/2020 0959   HgbA1c:  Lab Results  Component Value Date   HGBA1C 6.1 (H) 02/14/2020   Urine Drug Screen: No results found for: LABOPIA, COCAINSCRNUR, LABBENZ, AMPHETMU, THCU, LABBARB  Alcohol Level No results found for: ETH  IMAGING past 24 hours No results found.  PHYSICAL EXAM    Temp:  [97.6 F (36.4 C)-98.7 F (37.1 C)] 98.7 F (37.1 C) (04/08 0745) Pulse Rate:  [56-69] 66 (04/08 0745) Resp:  [13-18] 17 (04/08 0745) BP:  (143-164)/(57-74) 144/71 (04/08 0745) SpO2:  [95 %-98 %] 96 % (04/08 0745)  General - Well nourished, well developed, not in acute distress  Ophthalmologic - fundi not visualized due to noncooperation.  Cardiovascular - Regular rhythm and rate.  Mental Status -  Mildly lethargic, awake, alert and fully orientation to time, place, and person were intact. Language including expression, naming, repetition, comprehension was assessed and found intact. Fund of Knowledge was assessed and was intact.  Cranial Nerves II - XII - II - Visual field intact OU. III, IV, VI - Extraocular movements intact. V - Facial sensation intact bilaterally. VII - mild left facial droop. VIII - Hearing & vestibular intact bilaterally. X - Palate elevates symmetrically. XI - Chin turning & shoulder shrug intact bilaterally. XII - Tongue protrusion intact.  Motor Strength - The patient's strength was normal in all extremities and pronator drift  was absent.  Bulk was normal and fasciculations were absent.   Motor Tone - Muscle tone was assessed at the neck and appendages and was normal.  Reflexes - The patient's reflexes were symmetrical in all extremities and she had no pathological reflexes.  Sensory - Light touch, temperature/pinprick were assessed and were symmetrical.    Coordination - The patient had normal movements in the hands with no ataxia or dysmetria.  Tremor was absent.  Gait and Station - deferred.   ASSESSMENT/PLAN [name] is a 74 y.o. female with history of HTN presenting with severe persistent HA accompanied by nausea and vomiting.   IVH - right lateral ventricle IVH secondary to ? CAA vs. HTN  CT head 4/5 0032 R lateral IVH. Severe chronic ischemic microangiopathy.  CT head 4/5 0857 no sign change  MRI Stable IVH, severe leukoaraiosis. Multiple foci throughout progressed from previous MRI d/t microangiopathy vs amyloid.  MRA unremarkable   Repeat CT head 4/7 stable  IVH and ventricle size  2D Echo EF 65-70%. No source of embolus   LDL 92   HgbA1c 6.1   SCDs for VTE prophylaxis  aspirin 81 mg daily prior to admission, now on No antithrombotic given IVH and likely CAA  Therapy recommendations:  HH PT, HH OT, HH SLP  Disposition:  anticipate d/c tomorrow   Pancytosis due to polycythemia vera  WBC 12->24.4->17.9->17.8  Hgb 17.1->18.6->16.2->16.0->15.4   PLT 990->738->691->669  Dr. 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 Ron Duke  Hypertension  Home meds:  losartan 100, amlodipine 5  On losartan 50 bid and amlodipine 10  Add hydralazine low dose . SBP goal < 140  Now Off Cleviprex  Stable on the high end . 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 Multiple foci throughout progressed from previous MRI d/t microangiopathy vs amyloid.  As per husband, pt has some anomia as baseline  BP goal < 140  Avoid antiplatelet or anticoagulation  Hyperlipidemia  Home meds:  lipitor 10  LDL 92  Statin held in setting of acute ICH  Consider continuation of statin at discharge  Other Stroke Risk Factors  Advanced age  Former Cigarette smoker  Overweight, Body mass index is 26.73 kg/m., recommend weight loss, diet and exercise as appropriate   Family hx stroke (mother, father, and multiple other family members)  Other Active Problems  Hepatitis C  Hypothyroid on synthroid. TSH WNL - resume synthroid   Hospital day # 3  Victoria Hawking, MD PhD Stroke Neurology 02/17/2020 2:27  PM  To contact Stroke Continuity provider, please refer to [email or web address] After hours, contact General Neurology

## 2020-02-17 NOTE — Discharge Summary (Addendum)
Stroke Discharge Summary  Patient ID: [name]   MRN: [id number]      DOB: [birth year]  Date of Admission: 02/13/2020 Date of Discharge: 03/10/2020  Attending Physician:  Victoria Hawking, MD, Stroke MD Consultant(s):   Magrinat, Virgie Dad, MD (hematology/oncology), Victoria Mckusick, DO (Interventionalist-bone marrow)   Patient's PCP:  Victoria Caraway, MD  DISCHARGE DIAGNOSIS:  Principal Problem:   IVH (intraventricular hemorrhage) ([name]) secondary to deep cerebral venous sinus thrombosis  s/p mechanical thrombectomy related to polycythemia and hospital course complicated by bilateral pulmonary embolism, heparin-induced thrombocytopenia and anemia Active Problems:   Hyperlipidemia   Hypertension   Headache   Hypothyroidism   Polycythemia vera ([place])   Likely Cerebral amyloid angiopathy ([name])   Allergies as of 02/17/2020       Reactions   Erythromycin Itching, Other (See Comments)   Severe stomach pains, diarrhea      LABORATORY STUDIES CBC    Component Value Date/Time   WBC 17.8 (H) 02/17/2020 0423   RBC 5.16 (H) 02/17/2020 0423   HGB 15.4 (H) 02/17/2020 0423   HCT 47.7 (H) 02/17/2020 0423   PLT 669 (H) 02/17/2020 0423   MCV 92.4 02/17/2020 0423   MCH 29.8 02/17/2020 0423   MCHC 32.3 02/17/2020 0423   RDW 14.3 02/17/2020 0423   LYMPHSABS 1.6 02/17/2020 0423   MONOABS 1.3 (H) 02/17/2020 0423   EOSABS 0.0 02/17/2020 0423   BASOSABS 0.1 02/17/2020 0423   CMP    Component Value Date/Time   NA 141 02/16/2020 1938   K 3.5 02/16/2020 1938   CL 111 02/16/2020 1938   CO2 22 02/16/2020 1938   GLUCOSE 129 (H) 02/16/2020 1938   BUN 13 02/16/2020 1938   CREATININE 0.80 02/16/2020 1938   CALCIUM 9.0 02/16/2020 1938   PROT 6.9 08/15/2014 1838   ALBUMIN 3.8 02/16/2020 1938   AST 18 08/15/2014 1838   ALT 21 08/15/2014 1838   ALKPHOS 70 08/15/2014 1838   BILITOT 0.9 08/15/2014 1838   GFRNONAA >60 02/16/2020 1938   GFRAA >60 02/16/2020 1938   COAGS Lab Results  Component  Value Date   INR 1.1 02/14/2020   Lipid Panel    Component Value Date/Time   CHOL 172 02/14/2020 0959   TRIG 46 02/14/2020 0959   HDL 71 02/14/2020 0959   CHOLHDL 2.4 02/14/2020 0959   VLDL 9 02/14/2020 0959   LDLCALC 92 02/14/2020 0959   HgbA1C  Lab Results  Component Value Date   HGBA1C 6.1 (H) 02/14/2020    SIGNIFICANT DIAGNOSTIC STUDIES CT HEAD WO CONTRAST  Result Date: 02/16/2020 CLINICAL DATA:  Intracranial hemorrhage, follow-up EXAM: CT HEAD WITHOUT CONTRAST TECHNIQUE: Contiguous axial images were obtained from the base of the skull through the vertex without intravenous contrast. COMPARISON:  02/14/2020 FINDINGS: Brain: Intraventricular hemorrhage is again identified primarily within the right lateral ventricle. There is no new hemorrhage. Caliber of the ventricles is similar. No new loss of gray differentiation. Confluent hypoattenuation is again identified supratentorial white matter. Vascular: No new finding. Skull: Remains unremarkable. Sinuses/Orbits: No acute finding. Other: None. IMPRESSION: No substantial change in intraventricular hemorrhage primarily within the right lateral ventricle. No new hemorrhage. Stable caliber of the ventricles. Electronically Signed   By: Macy Mis M.D.   On: 02/16/2020 07:09   CT HEAD WO CONTRAST  Result Date: 02/14/2020 CLINICAL DATA:  Follow-up hemorrhage EXAM: CT HEAD WITHOUT CONTRAST TECHNIQUE: Contiguous axial images were obtained from the base of the skull through the  vertex without intravenous contrast. COMPARISON:  Earlier same day, 8 hours ago. FINDINGS: Brain: Intraventricular hemorrhage within the right lateral ventricle persists, approximately the same volume without evidence of ongoing or additional bleeding. Small amount of blood present within the fourth ventricle and left lateral ventricle. Small amount of subarachnoid blood visible within a left parietal sulcus. No overall change in ventricular size. Widespread chronic  microangiopathic change of the white matter as seen previously. Old small vessel infarction in the right basal ganglia. No sign of acute infarction. I do not identify an intraparenchymal source. Vascular: There is atherosclerotic calcification of the major vessels at the base of the brain. Skull: Negative Sinuses/Orbits: Clear/normal Other: None IMPRESSION: No significant change since 8 hours ago. Intraventricular hemorrhage primarily within the right lateral ventricle. No evidence of ongoing bleeding. Small amount of blood dependent within the left lateral ventricle and within the fourth ventricle. Ventricular size is stable. Small amount of subarachnoid blood evident in a left parietal sulcus. Electronically Signed   By: Nelson Chimes M.D.   On: 02/14/2020 08:57   CT Head Wo Contrast  Result Date: 02/14/2020 CLINICAL DATA:  Acute onset headache EXAM: CT HEAD WITHOUT CONTRAST TECHNIQUE: Contiguous axial images were obtained from the base of the skull through the vertex without intravenous contrast. COMPARISON:  None. FINDINGS: Brain: There is acute hemorrhage within the right lateral ventricle. No definite intraparenchymal component is identified. There is severe chronic white matter disease, most commonly indicating chronic ischemic microangiopathy. Volume of CSF spaces is normal. No midline shift or other mass effect Vascular: No abnormal hyperdensity of the major intracranial arteries or dural venous sinuses. No intracranial atherosclerosis. Skull: The visualized skull base, calvarium and extracranial soft tissues are normal. Sinuses/Orbits: No fluid levels or advanced mucosal thickening of the visualized paranasal sinuses. No mastoid or middle ear effusion. The orbits are normal. IMPRESSION: 1. Acute intraventricular hemorrhage within the right lateral ventricle. No definite parenchymal component identified. This is an unusual pattern, but is perhaps less unlikely in the setting of this patient's abnormal  coagulation status. 2. Severe chronic ischemic microangiopathy. Critical Value/emergent results were called by telephone at the time of interpretation on 02/14/2020 at 12:31 am to provider DAVID [facility] , who verbally acknowledged these results. Electronically Signed   By: Ulyses Jarred M.D.   On: 02/14/2020 00:32   MR ANGIO HEAD WO CONTRAST  Result Date: 02/14/2020 CLINICAL DATA:  Follow-up cerebral hemorrhage. EXAM: MRI HEAD WITHOUT CONTRAST MRA HEAD WITHOUT CONTRAST TECHNIQUE: Multiplanar, multiecho pulse sequences of the brain and surrounding structures were obtained without intravenous contrast. Angiographic images of the head were obtained using MRA technique without contrast. COMPARISON:  Head CT February 14, 2020 6 FINDINGS: MRI HEAD FINDINGS Brain: Redemonstrated right intraventricular hemorrhage with extension into the third and fourth ventricle as well as the occipital horn of left lateral ventricle. The ventricular size remains stable. No midline shift. A loculated fluid collection measuring approximately 1 cm with hematocrit level is noted in the left parietal region at midline (series 11, image 23). Extensive confluent foci of T2 hyperintensity are seen within the white matter of the cerebral hemispheres, basal ganglia, thalami and pons, nonspecific. Scattered punctate foci of susceptibility artifact are seen the bilateral cerebral hemispheres predominantly in subcortical location, bilateral thalami, pons and cerebellar hemispheres, likely representing hemosiderin deposit. This appears progressed since prior MRI. Vascular: Normal flow voids. Skull and upper cervical spine: Normal marrow signal. Sinuses/Orbits: Mild mucosal thickening of the right maxillary sinus. The orbits are maintained. Other:  None. MRA HEAD FINDINGS The visualized portions of the distal cervical and intracranial internal carotid arteries are widely patent with normal flow related enhancement. The bilateral anterior cerebral arteries and  middle cerebral arteries are widely patent with antegrade flow without high-grade flow-limiting stenosis or proximal branch occlusion. No intracranial aneurysm within the anterior circulation. The vertebral arteries are widely patent with antegrade flow. The posterior inferior cerebral arteries are normal. Redemonstrated is a fenestration of the bilateral A1 segments. Vertebrobasilar junction and basilar artery are widely patent with antegrade flow without evidence of basilar stenosis or aneurysm. Posterior cerebral arteries are normal bilaterally. No intracranial aneurysm within the posterior circulation. IMPRESSION: 1. Stable right intraventricular hemorrhage with extension into the third and fourth ventricle. Stable ventricular size. No evidence of hydrocephalus. 2. Extensive confluent T2 hyperintensity within the white matter of the cerebral hemispheres, basal ganglia, thalami, and pons, nonspecific but may be related to chronic small vessel ischemic changes. 3. Multiple foci of susceptibility artifact throughout the brain parenchyma, progressed from prior MRI. These may be related to microangiopathy versus amyloid angiopathy. Electronically Signed   By: Pedro Earls M.D.   On: 02/14/2020 14:54   MR BRAIN WO CONTRAST  Result Date: 02/14/2020 CLINICAL DATA:  Follow-up cerebral hemorrhage. EXAM: MRI HEAD WITHOUT CONTRAST MRA HEAD WITHOUT CONTRAST TECHNIQUE: Multiplanar, multiecho pulse sequences of the brain and surrounding structures were obtained without intravenous contrast. Angiographic images of the head were obtained using MRA technique without contrast. COMPARISON:  Head CT February 14, 2020 6 FINDINGS: MRI HEAD FINDINGS Brain: Redemonstrated right intraventricular hemorrhage with extension into the third and fourth ventricle as well as the occipital horn of left lateral ventricle. The ventricular size remains stable. No midline shift. A loculated fluid collection measuring approximately 1  cm with hematocrit level is noted in the left parietal region at midline (series 11, image 23). Extensive confluent foci of T2 hyperintensity are seen within the white matter of the cerebral hemispheres, basal ganglia, thalami and pons, nonspecific. Scattered punctate foci of susceptibility artifact are seen the bilateral cerebral hemispheres predominantly in subcortical location, bilateral thalami, pons and cerebellar hemispheres, likely representing hemosiderin deposit. This appears progressed since prior MRI. Vascular: Normal flow voids. Skull and upper cervical spine: Normal marrow signal. Sinuses/Orbits: Mild mucosal thickening of the right maxillary sinus. The orbits are maintained. Other: None. MRA HEAD FINDINGS The visualized portions of the distal cervical and intracranial internal carotid arteries are widely patent with normal flow related enhancement. The bilateral anterior cerebral arteries and middle cerebral arteries are widely patent with antegrade flow without high-grade flow-limiting stenosis or proximal branch occlusion. No intracranial aneurysm within the anterior circulation. The vertebral arteries are widely patent with antegrade flow. The posterior inferior cerebral arteries are normal. Redemonstrated is a fenestration of the bilateral A1 segments. Vertebrobasilar junction and basilar artery are widely patent with antegrade flow without evidence of basilar stenosis or aneurysm. Posterior cerebral arteries are normal bilaterally. No intracranial aneurysm within the posterior circulation. IMPRESSION: 1. Stable right intraventricular hemorrhage with extension into the third and fourth ventricle. Stable ventricular size. No evidence of hydrocephalus. 2. Extensive confluent T2 hyperintensity within the white matter of the cerebral hemispheres, basal ganglia, thalami, and pons, nonspecific but may be related to chronic small vessel ischemic changes. 3. Multiple foci of susceptibility artifact  throughout the brain parenchyma, progressed from prior MRI. These may be related to microangiopathy versus amyloid angiopathy. Electronically Signed   By: Pedro Earls M.D.   On: 02/14/2020  14:54   DG CHEST PORT 1 VIEW  Result Date: 02/14/2020 CLINICAL DATA:  Status post PICC placement EXAM: PORTABLE CHEST 1 VIEW COMPARISON:  August 15, 2014 FINDINGS: The heart size and mediastinal contours are within normal limits. Aortic knob calcifications are seen. A right-sided central venous catheter seen with the tip at the superior cavoatrial junction. No pneumothorax. No pleural effusion. No acute osseous abnormality. IMPRESSION: Interval placement of right-sided central venous catheter with the tip at the superior cavoatrial junction. Electronically Signed   By: Prudencio Pair M.D.   On: 02/14/2020 13:09   CT BONE MARROW BIOPSY & ASPIRATION  Result Date: 02/15/2020 CLINICAL DATA:  Polycythemia EXAM: CT GUIDED DEEP ILIAC BONE ASPIRATION AND CORE BIOPSY TECHNIQUE: Patient was placed prone on the CT gantry and limited axial scans through the pelvis were obtained. Appropriate skin entry site was identified. Skin site was marked, prepped with chlorhexidine, draped in usual sterile fashion, and infiltrated locally with 1% lidocaine. Intravenous Fentanyl 67mg and Versed 168mwere administered as conscious sedation during continuous monitoring of the patient's level of consciousness and physiological / cardiorespiratory status by the radiology RN, with a total moderate sedation time of 12 minutes. Under CT fluoroscopic guidance an 11-gauge Cook trocar bone needle was advanced into the right iliac bone just lateral to the sacroiliac joint. Once needle tip position was confirmed, core and aspiration samples were obtained, submitted to pathology for approval. Post procedure scans show no hematoma or fracture. Patient tolerated procedure well. COMPLICATIONS: COMPLICATIONS none IMPRESSION: 1. Technically successful  CT guided right iliac bone core and aspiration biopsy. Electronically Signed   By: D Lucrezia Europe.D.   On: 02/15/2020 15:11   ECHOCARDIOGRAM COMPLETE  Result Date: 02/14/2020    ECHOCARDIOGRAM REPORT   Patient Name:   Victoria FLEGALate of Exam: 02/14/2020 Medical Rec #:  [id number]      Height:       62.0 in Accession #:    [id number]     Weight:       155.0 lb Date of Birth:  [birth year]     BSA:          1.715 m Patient Age:    [age]         BP:           121/56 mmHg Patient Gender: F                HR:           90 bpm. Exam Location:  Inpatient Procedure: 2D Echo Indications:    stroke 434.91  History:        Patient has prior history of Echocardiogram examinations, most                 recent 08/16/2014. Risk Factors:Hypertension and Dyslipidemia.  Sonographer:    LaJohny Chesseferring Phys: [id number] IMPRESSIONS  1. Left ventricular ejection fraction, by estimation, is 65 to 70%. The left ventricle has normal function. The left ventricle has no regional wall motion abnormalities. Left ventricular diastolic parameters were normal.  2. Right ventricular systolic function is normal. The right ventricular size is normal. Tricuspid regurgitation signal is inadequate for assessing PA pressure.  3. The mitral valve is grossly normal. No evidence of mitral valve regurgitation. No evidence of mitral stenosis.  4. The aortic valve is tricuspid. Aortic valve regurgitation is not visualized. Mild aortic valve sclerosis is present, with no evidence of  aortic valve stenosis.  5. The inferior vena cava is normal in size with greater than 50% respiratory variability, suggesting right atrial pressure of 3 mmHg. Conclusion(s)/Recommendation(s): No intracardiac source of embolism detected on this transthoracic study. A transesophageal echocardiogram is recommended to exclude cardiac source of embolism if clinically indicated. FINDINGS  Left Ventricle: Left ventricular ejection fraction, by estimation, is  65 to 70%. The left ventricle has normal function. The left ventricle has no regional wall motion abnormalities. The left ventricular internal cavity size was normal in size. There is  no left ventricular hypertrophy. Left ventricular diastolic parameters were normal. Right Ventricle: The right ventricular size is normal. No increase in right ventricular wall thickness. Right ventricular systolic function is normal. Tricuspid regurgitation signal is inadequate for assessing PA pressure. Left Atrium: Left atrial size was normal in size. Right Atrium: Right atrial size was normal in size. Pericardium: There is no evidence of pericardial effusion. Presence of pericardial fat pad. Mitral Valve: The mitral valve is grossly normal. Mild mitral annular calcification. No evidence of mitral valve regurgitation. No evidence of mitral valve stenosis. Tricuspid Valve: The tricuspid valve is grossly normal. Tricuspid valve regurgitation is not demonstrated. No evidence of tricuspid stenosis. Aortic Valve: The aortic valve is tricuspid. Aortic valve regurgitation is not visualized. Mild aortic valve sclerosis is present, with no evidence of aortic valve stenosis. Pulmonic Valve: The pulmonic valve was grossly normal. Pulmonic valve regurgitation is not visualized. No evidence of pulmonic stenosis. Aorta: The aortic root is normal in size and structure. Venous: The inferior vena cava is normal in size with greater than 50% respiratory variability, suggesting right atrial pressure of 3 mmHg. IAS/Shunts: The atrial septum is grossly normal.  LEFT VENTRICLE PLAX 2D LVIDd:         4.40 cm  Diastology LVIDs:         2.70 cm  LV e' lateral:   9.46 cm/s LV PW:         0.90 cm  LV E/e' lateral: 8.6 LV IVS:        0.70 cm  LV e' medial:    8.05 cm/s LVOT diam:     2.00 cm  LV E/e' medial:  10.1 LV SV:         74 LV SV Index:   43 LVOT Area:     3.14 cm  RIGHT VENTRICLE RV S prime:     17.10 cm/s TAPSE (M-mode): 2.5 cm LEFT ATRIUM              Index       RIGHT ATRIUM           Index LA diam:        3.40 cm 1.98 cm/m  RA Area:     11.40 cm LA Vol (A2C):   26.9 ml 15.68 ml/m RA Volume:   23.90 ml  13.93 ml/m LA Vol (A4C):   38.5 ml 22.44 ml/m LA Biplane Vol: 33.5 ml 19.53 ml/m  AORTIC VALVE LVOT Vmax:   111.00 cm/s LVOT Vmean:  70.600 cm/s LVOT VTI:    0.234 m MITRAL VALVE MV Area (PHT): 3.12 cm    SHUNTS MV Decel Time: 243 msec    Systemic VTI:  0.23 m MV E velocity: 81.00 cm/s  Systemic Diam: 2.00 cm MV A velocity: 99.80 cm/s MV E/A ratio:  0.81 Eleonore Chiquito MD Electronically signed by Eleonore Chiquito MD Signature Date/Time: 02/14/2020/4:33:15 PM    Final    [place] EKG SITE  RITE  Result Date: 02/14/2020 If Site Rite image not attached, placement could not be confirmed due to current cardiac rhythm.     HISTORY OF PRESENT ILLNESS [name] is a 74 y.o. female with a history of hypertension who presents with headache that started this morning (LKW 02/13/2020 midmorning).  She states that she was sitting in front computer and noticed that the headache was at the top of her neck.  Is fairly severe, and has been persistent since onset.  It is primarily occipital in location.  Because of this she sought care in the emergency department, where routine blood work revealed evidence of thrombocytosis.  She had a CT scan given the new onset headache which reveals intraventricular hemorrhage. She denies any antecedent illness, weakness, numbness, visual change.  She does have some nausea and vomiting has been present over the course the day. Modified Rankin Scale: 0. NIHSS: 0. She was admitted to the neuro ICU.    HOSPITAL COURSE Ms. ANALEI WHINERY is a 74 y.o. female with history of HTN presenting with severe persistent HA accompanied by nausea and vomiting which has now improved.   IVH - right lateral ventricle IVH secondary to ? MDS vs. CAA vs. HTN CT head 4/5 0032 R lateral IVH. Severe chronic ischemic microangiopathy. CT head 4/5 0857 no  sign change MRI Stable IVH, severe leukoaraiosis. Multiple foci throughout progressed from previous MRI d/t microangiopathy vs amyloid. MRA unremarkable  Repeat CT head 4/7 stable IVH and ventricle size 2D Echo EF 65-70%. No source of embolus  LDL 92  HgbA1c 6.1  SCDs for VTE prophylaxis aspirin 81 mg daily prior to admission, now on No antithrombotic given IVH Therapy recommendations:  HH PT, HH OT, HH SLP Disposition:  anticipate d/c tomorrow    Pancytosis, improving - likely polycythemia vera, ? MDS WBC 12->24.4->17.9->17.8, Hgb 17.1->18.6->16.2->16.0->15.4, PLT 990->738->691->669 Dr. 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 Ron Agee   Hypertension Home meds:  losartan 100, amlodipine 5 On losartan 50 bid and amlodipine 10 SBP goal < 140 Now Off Cleviprex Stable on the high end Long-term BP goal normotensive   Likely CAA 2015 MRI showed numerous MCBs throughout the brain as well as severe confluent leukoaraiosis  MRI 02/14/20 Extensive confluent T2 hyperintensity c/w small vessel disease. Multiple foci throughout progressed from previous MRI d/t microangiopathy vs amyloid. As per husband, pt has some anomia as baseline BP goal < 140 Avoid antiplatelet or anticoagulation   Hyperlipidemia Home meds:  lipitor 10 LDL 92 Statin held in setting of acute ICH Consider continuation of statin at discharge   Other Stroke Risk Factors Advanced age Former Cigarette smoker Overweight, Body mass index is 26.73 kg/m., recommend weight loss, diet and exercise as appropriate  Family hx stroke (mother, father, and multiple other family members)   Other Active Problems Hepatitis C Hypothyroid on synthroid. TSH WNL - resume synthroid   DISCHARGE EXAM Blood pressure (!) 144/71, pulse 66, temperature  98.7 F (37.1 C), temperature source Oral, resp. rate 17, height '5\' 2"'  (1.575 m), weight 66.3 kg, SpO2 96 %.  Neuro -patient is awake and alert   She has mixed aphasia but can be clearly understood and speaks short sentences.  She has some word finding difficulties and nonfluent speech.  She can follow  only simple midline and one-step commands.  She has diminished attention registration and recall.  She is disoriented.  She cannot name her son she blinks to threat more on the left than on the right.  No facial weakness.  Tongue midline.  She moves all 4 extremities well against gravity without focal weakness.  . Sensation, coordination not cooperative and gait not tested.  Discharge Diet   Heart healthy thin liquids  DISCHARGE PLAN Disposition:  Inpatient rehab Due to hemorrhage and risk of bleeding, do not take aspirin, aspirin-containing medications, or ibuprofen products  Continue IV argatroban till patient is able to swallow Pradaxa without being crushed and then switch to Pradaxa 150 mg twice daily for at least 6 to 9 months. Ongoing stroke risk factor control by Primary Care Physician at time of discharge Follow-up PCP Victoria Caraway, MD in 2 weeks after discharge from rehab Follow-up in [facility] in 6 weeks, office to schedule an appointment.  Follow-up Dr. Jana Hakim -hematology in 4 weeks for restarting hydroxyurea for her polycythemia  35 minutes were spent preparing discharge. I have personally obtained history,examined this patient, reviewed notes, independently viewed imaging studies, participated in medical decision making and plan of care.ROS completed by me personally and pertinent positives fully documented  I have made any additions or clarifications directly to the above note. Agree with note above.    Antony Contras, MD Medical Director [address] [facility] Pager: [phone] 03/13/2020 9:17 AM

## 2020-02-17 NOTE — Plan of Care (Signed)
  Problem: Education: Goal: Knowledge of disease or condition will improve Outcome: Progressing Goal: Knowledge of secondary prevention will improve Outcome: Progressing Goal: Knowledge of patient specific risk factors addressed and post discharge goals established will improve Outcome: Progressing   Problem: Education: Goal: Knowledge of disease or condition will improve Outcome: Progressing Goal: Knowledge of secondary prevention will improve Outcome: Progressing Goal: Knowledge of patient specific risk factors addressed and post discharge goals established will improve Outcome: Progressing Goal: Individualized Educational Video(s) Outcome: Progressing   Problem: Coping: Goal: Will verbalize positive feelings about self Outcome: Progressing Goal: Will identify appropriate support needs Outcome: Progressing   Problem: Self-Care: Goal: Ability to participate in self-care as condition permits will improve Outcome: Progressing Goal: Verbalization of feelings and concerns over difficulty with self-care will improve Outcome: Progressing Goal: Ability to communicate needs accurately will improve Outcome: Progressing   Problem: Nutrition: Goal: Risk of aspiration will decrease Outcome: Progressing Goal: Dietary intake will improve Outcome: Progressing

## 2020-02-17 NOTE — Progress Notes (Signed)
Physical Therapy Treatment Patient Details Name: [name] MRN: [id number] DOB: 22-Jun-1946 Today's Date: 02/17/2020    History of Present Illness 74 yo female presented with persistent HA, nausea and vomiting. pt with stable R latearl ventricle IVH PMH HTN    PT Comments    Patient seen for mobility progression. Pt's mobility continues to be limited by headache that worsens and nausea while standing and ambulating. Pt requires min guard-min A for OOB mobility due to impaired balance. PT will continue to follow acutely and progress as tolerated.     Follow Up Recommendations  Home health PT;Supervision for mobility/OOB     Equipment Recommendations  None recommended by PT    Recommendations for Other Services       Precautions / Restrictions Precautions Precautions: Fall Restrictions Weight Bearing Restrictions: No    Mobility  Bed Mobility Overal bed mobility: Needs Assistance Bed Mobility: Sit to Supine     Supine to sit: Supervision     General bed mobility comments: supervision for safety  Transfers Overall transfer level: Needs assistance Equipment used: None Transfers: Sit to/from Stand Sit to Stand: Min guard         General transfer comment: min guard for safety  Ambulation/Gait Ambulation/Gait assistance: Min assist;Min guard Gait Distance (Feet): 30 Feet Assistive device: IV Pole Gait Pattern/deviations: Step-through pattern;Decreased stride length;Drifts right/left Gait velocity: decr   General Gait Details: assist to steady; pt more steady with single UE support    Stairs             Wheelchair Mobility    Modified Rankin (Stroke Patients Only) Modified Rankin (Stroke Patients Only) Pre-Morbid Rankin Score: No symptoms Modified Rankin: Moderately severe disability     Balance Overall balance assessment: Needs assistance Sitting-balance support: Bilateral upper extremity supported;Feet supported Sitting balance-Leahy  Scale: Fair     Standing balance support: During functional activity;Single extremity supported Standing balance-Leahy Scale: Fair                              Cognition Arousal/Alertness: Awake/alert Behavior During Therapy: WFL for tasks assessed/performed Overall Cognitive Status: Impaired/Different from baseline Area of Impairment: Awareness;Memory;Attention                   Current Attention Level: Sustained Memory: Decreased short-term memory     Awareness: Emergent   General Comments: slow processing, inability to attend to task and conversation simultaneously; pt answering questions with "i don't know" several times during session      Exercises      General Comments        Pertinent Vitals/Pain Pain Assessment: Faces Faces Pain Scale: Hurts even more Pain Descriptors / Indicators: Headache Pain Intervention(s): Limited activity within patient's tolerance    Home Living                      Prior Function            PT Goals (current goals can now be found in the care plan section) Progress towards PT goals: Progressing toward goals    Frequency    Min 4X/week      PT Plan Current plan remains appropriate    Co-evaluation              AM-PAC PT "6 Clicks" Mobility   Outcome Measure  Help needed turning from your back to your side while in a flat bed  without using bedrails?: A Little Help needed moving from lying on your back to sitting on the side of a flat bed without using bedrails?: A Little Help needed moving to and from a bed to a chair (including a wheelchair)?: A Little Help needed standing up from a chair using your arms (e.g., wheelchair or bedside chair)?: A Little Help needed to walk in hospital room?: A Little Help needed climbing 3-5 steps with a railing? : A Lot 6 Click Score: 17    End of Session Equipment Utilized During Treatment: Gait belt Activity Tolerance: Patient limited by  pain(headache worsens with mobility) Patient left: with call bell/phone within reach;in bed;with bed alarm set Nurse Communication: Mobility status PT Visit Diagnosis: Other abnormalities of gait and mobility (R26.89);Pain;Other symptoms and signs involving the nervous system (R29.898) Pain - part of body: (headache)     Time: [id number] PT Time Calculation (min) (ACUTE ONLY): 21 min  Charges:  $Gait Training: 8-22 mins                     Earney Navy, PTA Acute Rehabilitation Services Pager: [phone] Office: [phone]     Darliss Cheney 02/17/2020, 1:36 PM

## 2020-02-17 NOTE — Progress Notes (Signed)
Occupational Therapy Treatment Patient Details Name: [name] MRN: [id number] DOB: 26-Dec-1945 Today's Date: 02/17/2020    History of present illness 74 yo female presented with persistent HA, nausea and vomiting. pt with stable R latearl ventricle IVH PMH HTN   OT comments  Patient continues to make steady progress towards goals in skilled OT session. Patient's session encompassed further assessment of vision during a functional task and ADLs. Pt not self occluding eye in session, and was able to read from menu, and locate items on L with no cues. Pt remains minorly unsteady in gait when transferring to commode, requiring one cue for sequencing to wash hands after toileting. Discharge remains appropriate at this time; will continue to follow acutely.    Follow Up Recommendations  Home health OT    Equipment Recommendations  3 in 1 bedside commode    Recommendations for Other Services      Precautions / Restrictions Precautions Precautions: Fall Restrictions Weight Bearing Restrictions: No       Mobility Bed Mobility Overal bed mobility: Needs Assistance Bed Mobility: Sit to Supine     Supine to sit: Supervision     General bed mobility comments: supervision for safety  Transfers Overall transfer level: Needs assistance Equipment used: None Transfers: Sit to/from Stand Sit to Stand: Min guard         General transfer comment: min guard for safety    Balance Overall balance assessment: Needs assistance Sitting-balance support: Single extremity supported;Feet supported Sitting balance-Leahy Scale: Good     Standing balance support: During functional activity;Single extremity supported Standing balance-Leahy Scale: Fair Standing balance comment: does not require external support, but states "i feel like Im drunk" and will occasionally reach out to objects in environment for support                           ADL either performed or assessed  with clinical judgement   ADL Overall ADL's : Needs assistance/impaired     Grooming: Oral care;Min guard;Wash/dry face;Wash/dry hands;Standing Grooming Details (indicate cue type and reason): pt able to stand to complete tasks and locate items on L without cues                 Toilet Transfer: Magazine features editor Details (indicate cue type and reason): ambulated without A in session, however stated "I feel like Im drunk" when ambulating Toileting- Clothing Manipulation and Hygiene: Min guard Toileting - Clothing Manipulation Details (indicate cue type and reason): Safety     Functional mobility during ADLs: Min guard General ADL Comments: pt min guard to complete tasks, pt never closed eye to adjust vision to date (see vision assessment)     Vision Baseline Vision/History: Wears glasses Wears Glasses: At all times Vision Assessment?: No apparent visual deficits;Yes Eye Alignment: Within Functional Limits Ocular Range of Motion: Within Functional Limits Visual Fields: No apparent deficits Additional Comments: No occluding vision in session even in functional assessment noted lag in L peripheral vision, but no other decifits noted, pt able to find and locate items intentionally placed on L with no cues, able to read menu with no deficits or purposeful occlusion   Perception     Praxis      Cognition Arousal/Alertness: Awake/alert Behavior During Therapy: WFL for tasks assessed/performed Overall Cognitive Status: Impaired/Different from baseline Area of Impairment: Awareness;Memory;Attention  Current Attention Level: Sustained Memory: Decreased short-term memory     Awareness: Emergent   General Comments: pt more conversive with therapist in session, able to complete ADLs and have a conversation, min cues needed to wash hands after toileting and one cue to locate soap        Exercises     Shoulder Instructions       General  Comments      Pertinent Vitals/ Pain       Pain Assessment: Faces Faces Pain Scale: Hurts little more Pain Location: Positional headache Pain Descriptors / Indicators: Headache Pain Intervention(s): Limited activity within patient's tolerance;Monitored during session;Repositioned  Home Living                                          Prior Functioning/Environment              Frequency  Min 2X/week        Progress Toward Goals  OT Goals(current goals can now be found in the care plan section)  Progress towards OT goals: Progressing toward goals  Acute Rehab OT Goals Patient Stated Goal: to go home OT Goal Formulation: With patient Time For Goal Achievement: 03/01/20 Potential to Achieve Goals: Good  Plan Discharge plan remains appropriate    Co-evaluation                 AM-PAC OT "6 Clicks" Daily Activity     Outcome Measure   Help from another person eating meals?: None Help from another person taking care of personal grooming?: None Help from another person toileting, which includes using toliet, bedpan, or urinal?: None Help from another person bathing (including washing, rinsing, drying)?: A Little Help from another person to put on and taking off regular upper body clothing?: A Little Help from another person to put on and taking off regular lower body clothing?: A Little 6 Click Score: 21    End of Session    OT Visit Diagnosis: Unsteadiness on feet (R26.81);Muscle weakness (generalized) (M62.81)   Activity Tolerance Patient tolerated treatment well   Patient Left in bed;with bed alarm set;with call bell/phone within reach   Nurse Communication Mobility status;Precautions        Time: [id number] OT Time Calculation (min): 20 min  Charges: OT General Charges $OT Visit: 1 Visit OT Treatments $Self Care/Home Management : 8-22 mins  Corinne Ports E. Eliana Lueth, COTA/L Acute Rehabilitation  Services [place] 02/17/2020, 1:55 PM

## 2020-02-18 ENCOUNTER — Inpatient Hospital Stay (HOSPITAL_COMMUNITY): Payer: Medicare HMO

## 2020-02-18 ENCOUNTER — Other Ambulatory Visit (HOSPITAL_COMMUNITY): Payer: Medicare HMO

## 2020-02-18 ENCOUNTER — Other Ambulatory Visit: Payer: Self-pay | Admitting: Oncology

## 2020-02-18 ENCOUNTER — Telehealth: Payer: Self-pay | Admitting: Oncology

## 2020-02-18 DIAGNOSIS — E854 Organ-limited amyloidosis: Secondary | ICD-10-CM | POA: Diagnosis not present

## 2020-02-18 DIAGNOSIS — E039 Hypothyroidism, unspecified: Secondary | ICD-10-CM

## 2020-02-18 DIAGNOSIS — I10 Essential (primary) hypertension: Secondary | ICD-10-CM | POA: Diagnosis not present

## 2020-02-18 DIAGNOSIS — E7800 Pure hypercholesterolemia, unspecified: Secondary | ICD-10-CM | POA: Diagnosis not present

## 2020-02-18 LAB — CBC WITH DIFFERENTIAL/PLATELET
Abs Immature Granulocytes: 0.1 10*3/uL — ABNORMAL HIGH (ref 0.00–0.07)
Basophils Absolute: 0.1 10*3/uL (ref 0.0–0.1)
Basophils Relative: 1 %
Eosinophils Absolute: 0.2 10*3/uL (ref 0.0–0.5)
Eosinophils Relative: 1 %
HCT: 49.4 % — ABNORMAL HIGH (ref 36.0–46.0)
Hemoglobin: 16.1 g/dL — ABNORMAL HIGH (ref 12.0–15.0)
Immature Granulocytes: 1 %
Lymphocytes Relative: 13 %
Lymphs Abs: 1.8 10*3/uL (ref 0.7–4.0)
MCH: 29.3 pg (ref 26.0–34.0)
MCHC: 32.6 g/dL (ref 30.0–36.0)
MCV: 90 fL (ref 80.0–100.0)
Monocytes Absolute: 1.1 10*3/uL — ABNORMAL HIGH (ref 0.1–1.0)
Monocytes Relative: 8 %
Neutro Abs: 10.7 10*3/uL — ABNORMAL HIGH (ref 1.7–7.7)
Neutrophils Relative %: 76 %
Platelets: 721 10*3/uL — ABNORMAL HIGH (ref 150–400)
RBC: 5.49 MIL/uL — ABNORMAL HIGH (ref 3.87–5.11)
RDW: 13.8 % (ref 11.5–15.5)
WBC: 13.9 10*3/uL — ABNORMAL HIGH (ref 4.0–10.5)
nRBC: 0 % (ref 0.0–0.2)

## 2020-02-18 LAB — BASIC METABOLIC PANEL
Anion gap: 10 (ref 5–15)
BUN: 8 mg/dL (ref 8–23)
CO2: 24 mmol/L (ref 22–32)
Calcium: 9 mg/dL (ref 8.9–10.3)
Chloride: 106 mmol/L (ref 98–111)
Creatinine, Ser: 0.71 mg/dL (ref 0.44–1.00)
GFR calc Af Amer: >60 mL/min (ref >60)
GFR calc non Af Amer: >60 mL/min (ref >60)
Glucose, Bld: 132 mg/dL — ABNORMAL HIGH (ref 70–99)
Potassium: 2.7 mmol/L — CL (ref 3.5–5.1)
Sodium: 140 mmol/L (ref 135–145)

## 2020-02-18 LAB — BCR-ABL1, CML/ALL, PCR, QUANT: Interpretation (BCRAL):: NEGATIVE

## 2020-02-18 LAB — URIC ACID: Uric Acid, Serum: 2.5 mg/dL (ref 2.5–7.1)

## 2020-02-18 MED ORDER — CALCIUM CARBONATE ANTACID 500 MG PO CHEW
2.0000 | CHEWABLE_TABLET | ORAL | Status: AC
  Administered 2020-02-18: 400 mg via ORAL
  Filled 2020-02-18: qty 2

## 2020-02-18 MED ORDER — HEPARIN SODIUM (PORCINE) 1000 UNIT/ML IJ SOLN
INTRAMUSCULAR | Status: AC
  Administered 2020-02-18: 2400 [IU]
  Filled 2020-02-18: qty 3

## 2020-02-18 MED ORDER — HYDROXYUREA 500 MG PO CAPS: 100.0000 mg | ORAL_CAPSULE | Freq: Every day | ORAL | 6 refills | Status: DC

## 2020-02-18 MED ORDER — DIPHENHYDRAMINE HCL 25 MG PO CAPS: 25.0000 mg | ORAL_CAPSULE | Freq: Four times a day (QID) | ORAL | Status: DC | PRN

## 2020-02-18 MED ORDER — ACD FORMULA A 0.73-2.45-2.2 GM/100ML VI SOLN
Status: AC
  Filled 2020-02-18: qty 500

## 2020-02-18 MED ORDER — CALCIUM GLUCONATE-NACL 2-0.675 GM/100ML-% IV SOLN
2.0000 g | Freq: Once | INTRAVENOUS | Status: AC
  Administered 2020-02-18: 15:00:00 2000 mg via INTRAVENOUS
  Filled 2020-02-18: qty 100

## 2020-02-18 MED ORDER — SODIUM CHLORIDE 0.9 % IV SOLN
INTRAVENOUS | Status: AC
  Filled 2020-02-18 (×2): qty 200

## 2020-02-18 MED ORDER — ACD FORMULA A 0.73-2.45-2.2 GM/100ML VI SOLN
1000.0000 mL | Status: DC
  Filled 2020-02-18 (×2): qty 1000

## 2020-02-18 MED ORDER — ALLOPURINOL 300 MG PO TABS: 300.0000 mg | ORAL_TABLET | Freq: Every day | ORAL | 4 refills | Status: DC

## 2020-02-18 MED ORDER — DIVALPROEX SODIUM ER 500 MG PO TB24
500.0000 mg | ORAL_TABLET | Freq: Every day | ORAL | Status: DC
  Administered 2020-02-18: 500 mg via ORAL
  Filled 2020-02-18: qty 1

## 2020-02-18 MED ORDER — ACETAMINOPHEN 325 MG PO TABS: 650.0000 mg | ORAL_TABLET | ORAL | Status: DC | PRN

## 2020-02-18 MED ORDER — HEPARIN SODIUM (PORCINE) 1000 UNIT/ML IJ SOLN
1000.0000 [IU] | Freq: Once | INTRAMUSCULAR | Status: AC
  Filled 2020-02-18: qty 1

## 2020-02-18 MED ORDER — CALCIUM CARBONATE ANTACID 500 MG PO CHEW
CHEWABLE_TABLET | ORAL | Status: AC
  Administered 2020-02-18: 400 mg via ORAL
  Filled 2020-02-18: qty 4

## 2020-02-18 MED ORDER — POTASSIUM CHLORIDE CRYS ER 20 MEQ PO TBCR
40.0000 meq | EXTENDED_RELEASE_TABLET | ORAL | Status: AC
  Administered 2020-02-18 (×3): 40 meq via ORAL
  Filled 2020-02-18 (×3): qty 2

## 2020-02-18 NOTE — Progress Notes (Signed)
Physical Therapy Treatment Patient Details Name: [name] MRN: [id number] DOB: 1946/05/28 Today's Date: 02/18/2020    History of Present Illness 74 yo female presented with persistent HA, nausea and vomiting. pt with stable R latearl ventricle IVH PMH HTN    PT Comments    Patient in bed upon arrival and agreeable to participate in therapy despite continued headache. Pt's mobility is limited due to increased pain and "pressure" with postural changes especially in standing. Pt tolerated short distance gait in room. Overall min guard for safety with OOB mobility. PT will continue to follow acutely and progress as tolerated.     Follow Up Recommendations  Home health PT;Supervision for mobility/OOB     Equipment Recommendations  None recommended by PT    Recommendations for Other Services       Precautions / Restrictions Precautions Precautions: Fall Restrictions Weight Bearing Restrictions: No    Mobility  Bed Mobility Overal bed mobility: Modified Independent Bed Mobility: Sit to Supine;Supine to Sit           General bed mobility comments: increased time and effort due to increased pain with postural changes  Transfers Overall transfer level: Needs assistance Equipment used: None Transfers: Sit to/from Stand Sit to Stand: Min guard         General transfer comment: min guard for safety; no assist needed to power up; min guard in standing   Ambulation/Gait Ambulation/Gait assistance: Min guard Gait Distance (Feet): (10 ft X 2 (to bathroom and back to bed)) Assistive device: None Gait Pattern/deviations: Step-through pattern;Decreased stride length;Drifts right/left Gait velocity: decr   General Gait Details: min guard for safety due to unsteadiness; no LOB; guarded movements due to pain   Stairs             Wheelchair Mobility    Modified Rankin (Stroke Patients Only) Modified Rankin (Stroke Patients Only) Pre-Morbid Rankin Score: No  symptoms Modified Rankin: Moderately severe disability     Balance Overall balance assessment: Needs assistance Sitting-balance support: Feet supported Sitting balance-Leahy Scale: Good     Standing balance support: During functional activity Standing balance-Leahy Scale: Fair                              Cognition Arousal/Alertness: Awake/alert Behavior During Therapy: WFL for tasks assessed/performed Overall Cognitive Status: Impaired/Different from baseline Area of Impairment: Memory;Attention                   Current Attention Level: Sustained Memory: Decreased short-term memory                Exercises General Exercises - Lower Extremity Long Arc Quad: AROM;Both;20 reps;Seated Hip Flexion/Marching: AROM;Both;20 reps;Seated    General Comments        Pertinent Vitals/Pain Pain Assessment: Faces Faces Pain Scale: Hurts whole lot(4-8 depending on position) Pain Descriptors / Indicators: Headache Pain Intervention(s): Repositioned;Limited activity within patient's tolerance    Home Living                      Prior Function            PT Goals (current goals can now be found in the care plan section) Progress towards PT goals: Progressing toward goals    Frequency    Min 4X/week      PT Plan Current plan remains appropriate    Co-evaluation  AM-PAC PT "6 Clicks" Mobility   Outcome Measure  Help needed turning from your back to your side while in a flat bed without using bedrails?: A Little Help needed moving from lying on your back to sitting on the side of a flat bed without using bedrails?: A Little Help needed moving to and from a bed to a chair (including a wheelchair)?: A Little Help needed standing up from a chair using your arms (e.g., wheelchair or bedside chair)?: A Little Help needed to walk in hospital room?: A Little Help needed climbing 3-5 steps with a railing? : A Lot 6 Click  Score: 17    End of Session Equipment Utilized During Treatment: Gait belt Activity Tolerance: Patient limited by pain(headache worsens with mobility) Patient left: with call bell/phone within reach;in bed;with family/visitor present Nurse Communication: Mobility status PT Visit Diagnosis: Other abnormalities of gait and mobility (R26.89);Pain;Other symptoms and signs involving the nervous system (R29.898) Pain - part of body: (headache)     Time: [id number] PT Time Calculation (min) (ACUTE ONLY): 25 min  Charges:  $Gait Training: 8-22 mins $Therapeutic Activity: 8-22 mins                     Earney Navy, PTA Acute Rehabilitation Services Pager: [phone] Office: [phone]     Darliss Cheney 02/18/2020, 11:18 AM

## 2020-02-18 NOTE — TOC Transition Note (Signed)
Transition of Care [facility]) - CM/SW Discharge Note   Patient Details  Name: [name] MRN: [id number] Date of Birth: [birth year]  [facility] of [facility]) CM/SW Contact:  Geralynn Ochs, LCSW Phone Number: 02/18/2020, 11:52 AM   Clinical Narrative:   Patient discharging home with home health. CSW alerted [facility] that patient discharging home today. No further needs identified at this time.    Final next level of care: Home w Home Health Services Barriers to Discharge: Barriers Resolved   Patient Goals and CMS Choice   CMS Medicare.gov Compare Post Acute Care list provided to:: Patient Choice offered to / list presented to : Patient  Discharge Placement                       Discharge Plan and Services   Discharge Planning Services: CM Consult Post Acute Care Choice: Home Health                    HH Arranged: PT, OT, Speech Therapy HH Agency: [facility] Date [place]: 02/16/20   Representative spoke with at [place]: [facility] ([place]) Interventions     Readmission Risk Interventions No flowsheet data found.

## 2020-02-18 NOTE — Progress Notes (Signed)
STROKE TEAM PROGRESS NOTE   INTERVAL HISTORY Husband at the bedside. Patient seen after leukapheresis. She still complains of headache, bifrontal headache, seems to be worse than yesterday. Little bit more lethargic than yesterday. Will repeat CT to rule out change of IVH. Also add Depakote nightly for headache treatment. Continue Tylenol and Fioricet as needed. Potassium 2.7, keep supplement. Repeat tomorrow.  Vitals:   02/18/20 1537 02/18/20 1559 02/18/20 1617 02/18/20 1638  BP: 128/80 124/70 128/65 119/68  Pulse: 84 97 86 93  Resp: 17 14 16 18   Temp: 98 F (36.7 C) 98 F (36.7 C)  98.8 F (37.1 C)  TempSrc:  Oral  Oral  SpO2: 94% 96%  94%  Weight:      Height:       CBC:  Recent Labs  Lab 02/17/20 0423 02/18/20 0332  WBC 17.8* 13.9*  NEUTROABS 14.7* 10.7*  HGB 15.4* 16.1*  HCT 47.7* 49.4*  MCV 92.4 90.0  PLT 669* 123456*   Basic Metabolic Panel:  Recent Labs  Lab 02/16/20 1938 02/18/20 1147  NA 141 140  K 3.5 2.7*  CL 111 106  CO2 22 24  GLUCOSE 129* 132*  BUN 13 8  CREATININE 0.80 0.71  CALCIUM 9.0 9.0  PHOS 2.3*  --    Lipid Panel:     Component Value Date/Time   CHOL 172 02/14/2020 0959   TRIG 46 02/14/2020 0959   HDL 71 02/14/2020 0959   CHOLHDL 2.4 02/14/2020 0959   VLDL 9 02/14/2020 0959   LDLCALC 92 02/14/2020 0959   HgbA1c:  Lab Results  Component Value Date   HGBA1C 6.1 (H) 02/14/2020    IMAGING past 24 hours No results found.  PHYSICAL EXAM     Temp:  [98 F (36.7 C)-98.8 F (37.1 C)] 98.8 F (37.1 C) (04/09 1638) Pulse Rate:  [62-97] 93 (04/09 1638) Resp:  [14-18] 18 (04/09 1638) BP: (119-157)/(54-83) 119/68 (04/09 1638) SpO2:  [94 %-98 %] 94 % (04/09 1638)  General - Well nourished, well developed, mildly lethargic due to headache  Ophthalmologic - fundi not visualized due to noncooperation.  Cardiovascular - Regular rhythm and rate.  Mental Status -  Mildly lethargic due to headache, but awake, alert and orientation to  time, place, and person were intact. Language including expression, naming, repetition, comprehension was assessed and found intact.  Cranial Nerves II - XII - II - Visual field intact OU. III, IV, VI - Extraocular movements intact. V - Facial sensation intact bilaterally. VII - mild left facial droop. VIII - Hearing & vestibular intact bilaterally. X - Palate elevates symmetrically. XI - Chin turning & shoulder shrug intact bilaterally. XII - Tongue protrusion intact.  Motor Strength - The patient's strength was normal in all extremities and pronator drift was absent.  Bulk was normal and fasciculations were absent.   Motor Tone - Muscle tone was assessed at the neck and appendages and was normal.  Reflexes - The patient's reflexes were symmetrical in all extremities and she had no pathological reflexes.  Sensory - Light touch, temperature/pinprick were assessed and were symmetrical.    Coordination - The patient had normal movements in the hands with no ataxia or dysmetria.  Tremor was absent.  Gait and Station - deferred.   ASSESSMENT/PLAN Ms. LENYX CARDINAS is a 74 y.o. female with history of HTN presenting with severe persistent HA accompanied by nausea and vomiting.   IVH - right lateral ventricle IVH secondary to ? CAA vs. HTN  CT head 4/5 0032 R lateral IVH. Severe chronic ischemic microangiopathy.  CT head 4/5 0857 no sign change  MRI Stable IVH, severe leukoaraiosis. Multiple foci throughout progressed from previous MRI d/t microangiopathy vs amyloid.  MRA unremarkable   Repeat CT head 4/7 stable IVH and ventricle size  Repeat CT head 4/9 pending   2D Echo EF 65-70%. 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 Ron Agee next week  Hypertension  Home meds:  losartan 100, amlodipine 5  On losartan 50 bid and amlodipine 10  Add hydralazine 25 tid . SBP goal < 140  Now Off Cleviprex  BP Stable . 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 Multiple foci throughout progressed from previous MRI d/t microangiopathy vs amyloid.  As per husband, pt has some anomia as baseline  BP goal < 140  Avoid antiplatelet or anticoagulation  Hyperlipidemia  Home meds:  lipitor 10  LDL 92  Statin held in setting of acute ICH  Consider continuation of statin at discharge  Hypokalemia  Potassium 3.3->3.5->2.7  Supplement  Repeat potassium and check magnesium tomorrow  Other Stroke Risk Factors  Advanced age  Former Cigarette smoker  Overweight, Body mass index is 26.73 kg/m., recommend weight loss, diet and exercise as appropriate   Family hx stroke (mother, father, and multiple other family members)  Other Active Problems  Hepatitis C  Hypothyroid on synthroid. TSH WNL - resume synthroid   Hospital day # 4  I had long discussion with husband [name] at bedside, updated pt current condition, treatment plan and potential prognosis, and answered all the questions. He  expressed understanding and appreciation.    Rosalin Hawking, MD PhD Stroke Neurology 02/18/2020 5:22 PM  To contact Stroke Continuity provider, please refer to [email or web address] After hours, contact General Neurology

## 2020-02-18 NOTE — Telephone Encounter (Signed)
Scheduled apt per 4/8 sch message - pt aware of appt date and time

## 2020-02-18 NOTE — Progress Notes (Signed)
[place]  [phone] [phone]     ID: [name] DOB: 1946-06-30  MR#: [id number]  OEV#:[id number]  Patient Care Duke: Victoria Caraway, MD as PCP - General (Family Medicine) Victoria Cruel, MD OTHER MD:  CHIEF COMPLAINT: Follow-up intracranial bleed, polycythemia  CURRENT TREATMENT: Pheresis, Hydrea, therapeutic phlebotomy  INTERVAL HISTORY: JAK2 positive confirming diagnosis of Duke Vera. She is on for pheresis this AM. She will have the HD catheter removed by the [name] after pheresis-- order in place  REVIEW OF SYSTEMS: [name] .has a bad headache this AM; she is not sure what medications she can take for it or how often; she is nauseated but has not vomited; she walked to [place] yesterday and occasionally felt a bit woozy husband in room   HISTORY OF CURRENT ILLNESS: From the original consult note:  [name] has a history of hypertension followed by Dr. Theadore Duke.  For the last few days she has had headache and she took her blood pressure and it was as high as 168/102 which is unusual for her.  As this was not improving she presented to the emergency room last night 02/13/2020. Dr [name] obtained a basic lab work and a head CT.  The head CT without contrast showed an acute intraventricular hemorrhage in the right lateral ventricle, with no parenchymal component.  The lab work showed a white cell count of 12.0, hemoglobin 17.7, and platelets 990,000.  We were consulted for further evaluation and treatment.    PAST MEDICAL HISTORY: Past Medical History:  Diagnosis Date  . Hepatitis C   . Hyperlipidemia   . Hypertension   . Osteoporosis     PAST SURGICAL HISTORY: History reviewed. No pertinent surgical history.  FAMILY HISTORY History reviewed. No pertinent family history. The patient's father died at age 69 and the patient's mother at age 24 both from heart disease.  The patient has 1 sister, no brothers.  There is no  family history of blood problems and no family history of cancer to the patient's knowledge  GYNECOLOGIC HISTORY:  No LMP recorded. Patient is postmenopausal. Menarche: 74 years old Age at first live birth: 74 years old [name] 2 LMP 74 HRT yes, a few years  Hysterectomy?  Salpingo-oophorectomy?  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  Her husband [name] ran a business but is now retired.  Their son [name] is a Adult nurse.  Their son [name] is an Pharmacologist.  The patient has 1 grandson and 4 step grandchildren.  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    ADVANCED DIRECTIVES: In the absence of any documents to the contrary the patient's husband is her healthcare power of attorney   HEALTH MAINTENANCE: Social History   Tobacco Use  . Smoking status: Former Research scientist (life sciences)  . Smokeless tobacco: Never Used  Substance Use Topics  . Alcohol use: No  . Drug use: No     Colonoscopy: [name]  PAP: [name]  Bone density:  Mammogram: Due   Allergies  Allergen Reactions  . Erythromycin Itching and Other (See Comments)    Severe stomach pains, diarrhea    Current Facility-Administered Medications  Medication Dose Route Frequency Provider Last Rate Last Admin  .  stroke: mapping our early stages of recovery book   Does not apply Once Victoria Doom, MD      . acetaminophen (TYLENOL) tablet 650 mg  650 mg Oral Q6H PRN Victoria Hawking, MD  650 mg at 02/18/20 0841   Or  . acetaminophen (TYLENOL) 160 MG/5ML solution 650 mg  650 mg Per Tube Q6H PRN Victoria Hawking, MD       Or  . acetaminophen (TYLENOL) suppository 650 mg  650 mg Rectal Q6H PRN Victoria Hawking, MD      . allopurinol (ZYLOPRIM) tablet 300 mg  300 mg Oral Daily [name], Victoria Dad, MD   300 mg at 02/17/20 0957  . amLODipine (NORVASC) tablet 10 mg  10 mg Oral Daily Victoria Hawking, MD   10 mg at 02/17/20 0957  . butalbital-acetaminophen-caffeine (FIORICET) 50-325-40 MG per tablet 1 tablet  1 tablet Oral Q8H PRN Victoria Hawking, MD   1 tablet at 02/18/20 0433  . chlorhexidine (PERIDEX) 0.12 % solution 15 mL  15 mL Mouth Rinse BID Victoria Doom, MD   15 mL at 02/17/20 2240  . Chlorhexidine Gluconate Cloth 2 % PADS 6 each  6 each Topical Q0600 Victoria Doom, MD   6 each at 02/17/20 [id number]  . hydrALAZINE (APRESOLINE) tablet 25 mg  25 mg Oral TID Victoria Hawking, MD   25 mg at 02/17/20 2240  . hydroxyurea (HYDREA) capsule 1,000 mg  1,000 mg Oral Daily [name], Victoria Dad, MD   1,000 mg at 02/17/20 1002  . labetalol (NORMODYNE) injection 10-20 mg  10-20 mg Intravenous Q2H PRN Victoria Hawking, MD      . levothyroxine (SYNTHROID) tablet 125 mcg  125 mcg Oral QAC breakfast Victoria Hawking, MD   125 mcg at 02/18/20 [phone]  . losartan (COZAAR) tablet 50 mg  50 mg Oral BID Victoria Hawking, MD   50 mg at 02/17/20 2240  . ondansetron (ZOFRAN) injection 4 mg  4 mg Intravenous Q4H PRN Victoria Doom, MD   4 mg at 02/15/20 1220  . pantoprazole (PROTONIX) EC tablet 40 mg  40 mg Oral Daily Victoria Hawking, MD   40 mg at 02/17/20 0957  . senna-docusate (Senokot-S) tablet 1 tablet  1 tablet Oral BID Victoria Doom, MD   1 tablet at 02/17/20 2240  . sodium chloride 0.9 % bolus 1,000 mL  1,000 mL Intravenous Once Victoria Fuel, MD      . sodium chloride flush (NS) 0.9 % injection 10-40 mL  10-40 mL Intracatheter Q12H Victoria Hawking, MD   10 mL at 02/17/20 2241  . sodium chloride flush (NS) 0.9 % injection 10-40 mL  10-40 mL Intracatheter PRN Victoria Hawking, MD        OBJECTIVE: .white woman examined in bed  Vitals:   02/18/20 0400 02/18/20 0726  BP: (!) 143/83 (!) 156/80  Pulse: 68 63  Resp: 16 16  Temp: 98.5 F (36.9 C) 98 F (36.7 C)  SpO2: 96% 98%     Body mass index is 26.73 kg/m.   Wt Readings from Last 3 Encounters:  02/15/20 146 lb 2.6 oz (66.3 kg)  08/15/14 149 lb 0.5 oz (67.6 kg)    LAB RESULTS:  CMP     Component Value Date/Time   NA 141 02/16/2020 1938   K 3.5 02/16/2020 1938   CL 111 02/16/2020 1938    CO2 22 02/16/2020 1938   GLUCOSE 129 (H) 02/16/2020 1938   BUN 13 02/16/2020 1938   CREATININE 0.80 02/16/2020 1938   CALCIUM 9.0 02/16/2020 1938   PROT 6.9 08/15/2014 1838   ALBUMIN 3.8 02/16/2020 1938   AST 18 08/15/2014 1838   ALT 21 08/15/2014 1838   ALKPHOS 70 08/15/2014  1838   BILITOT 0.9 08/15/2014 1838   GFRNONAA >60 02/16/2020 1938   GFRAA >60 02/16/2020 1938    No results found for: TOTALPROTELP, ALBUMINELP, A1GS, A2GS, BETS, BETA2SER, GAMS, MSPIKE, SPEI  No results found for: KPAFRELGTCHN, LAMBDASER, KAPLAMBRATIO  Lab Results  Component Value Date   WBC 13.9 (H) 02/18/2020   NEUTROABS 10.7 (H) 02/18/2020   HGB 16.1 (H) 02/18/2020   HCT 49.4 (H) 02/18/2020   MCV 90.0 02/18/2020   PLT 721 (H) 02/18/2020    '@LASTCHEMISTRY' @  No results found for: LABCA2  No components found for: WGYKZL935  Recent Labs  Lab 02/14/20 0028  INR 1.1    No results found for: LABCA2  No results found for: TSV779  No results found for: TJQ300  No results found for: PQZ300  No results found for: CA2729  No components found for: HGQUANT  No results found for: CEA1 / No results found for: CEA1   No results found for: AFPTUMOR  No results found for: CHROMOGRNA  No results found for: PSA1  Admission on 02/13/2020  Component Date Value Ref Range Status  . Sodium 02/13/2020 138  135 - 145 mmol/L Final  . Potassium 02/13/2020 3.8  3.5 - 5.1 mmol/L Final  . Chloride 02/13/2020 105  98 - 111 mmol/L Final  . CO2 02/13/2020 22  22 - 32 mmol/L Final  . Glucose, Bld 02/13/2020 133* 70 - 99 mg/dL Final   Glucose reference range applies only to samples taken after fasting for at least 8 hours.  . BUN 02/13/2020 13  8 - 23 mg/dL Final  . Creatinine, Ser 02/13/2020 0.93  0.44 - 1.00 mg/dL Final  . Calcium 02/13/2020 9.5  8.9 - 10.3 mg/dL Final  . GFR calc non Af Amer 02/13/2020 >60  >60 mL/min Final  . GFR calc Af Amer 02/13/2020 >60  >60 mL/min Final  . Anion gap 02/13/2020  11  5 - 15 Final   Performed at [facility], [place] [address] [place], [place] [address]  . WBC 02/13/2020 12.0* 4.0 - 10.5 K/uL Final  . RBC 02/13/2020 6.02* 3.87 - 5.11 MIL/uL Final  . Hemoglobin 02/13/2020 17.7* 12.0 - 15.0 g/dL Final  . HCT 02/13/2020 55.5* 36.0 - 46.0 % Final  . MCV 02/13/2020 92.2  80.0 - 100.0 fL Final  . MCH 02/13/2020 29.4  26.0 - 34.0 pg Final  . MCHC 02/13/2020 31.9  30.0 - 36.0 g/dL Final  . RDW 02/13/2020 14.3  11.5 - 15.5 % Final  . Platelets 02/13/2020 990* 150 - 400 K/uL Corrected   Comment: This critical result has verified and been called to C.CHIRSO,RN by Susy Manor on 04 04 2021 at 2032, and has been read back.  REPEATED TO VERIFY CORRECTED ON 04/05 AT 1113: PREVIOUSLY REPORTED AS 990 This critical result has verified and been called to C.CHIRSO,RN by Susy Manor on 04 04 2021 at 2032, and has been read back.    Marland Kitchen nRBC 02/13/2020 0.0  0.0 - 0.2 % Final   Performed at [facility], [place] [address], [place], [place] [address]  . Prothrombin Time 02/14/2020 13.6  11.4 - 15.2 seconds Final  . INR 02/14/2020 1.1  0.8 - 1.2 Final   Comment: (NOTE) INR goal varies based on device and disease states. Performed at [facility], [place] [address], [place], [place] [address]   . aPTT 02/14/2020 40* 24 - 36 seconds Final   Comment:  IF BASELINE aPTT IS ELEVATED, SUGGEST PATIENT RISK ASSESSMENT BE USED TO DETERMINE APPROPRIATE ANTICOAGULANT THERAPY. Performed at [facility], [place] [address] [place], [place] [address]   . SARS Coronavirus 2 02/14/2020 NEGATIVE  NEGATIVE Final   Comment: (NOTE) SARS-CoV-2 target nucleic acids are NOT DETECTED. The SARS-CoV-2 RNA is generally detectable in upper and lower respiratory specimens during the acute phase of infection. Negative results do not preclude SARS-CoV-2 infection, do not rule out co-infections with other pathogens, and should not be used as the sole basis for  treatment or other patient management decisions. Negative results must be combined with clinical observations, patient history, and epidemiological information. The expected result is Negative. Fact Sheet for Patients: SugarRoll.be Fact Sheet for Healthcare Providers: https://www.woods-mathews.com/ This test is not yet approved or cleared by the [place] FDA and  has been authorized for detection and/or diagnosis of SARS-CoV-2 by FDA under an Emergency Use Authorization (EUA). This EUA will remain  in effect (meaning this test can be used) for the duration of the COVID-19 declaration under Section 56                          4(b)(1) of the Act, 21 U.S.C. section 360bbb-3(b)(1), unless the authorization is terminated or revoked sooner. Performed at [facility], [place] [address], [place], [place] [address]   . SARS Coronavirus 2 by RT PCR 02/14/2020 NEGATIVE  NEGATIVE Final   Comment: (NOTE) SARS-CoV-2 target nucleic acids are NOT DETECTED. The SARS-CoV-2 RNA is generally detectable in upper respiratoy specimens during the acute phase of infection. The lowest concentration of SARS-CoV-2 viral copies this assay can detect is 131 copies/mL. A negative result does not preclude SARS-Cov-2 infection and should not be used as the sole basis for treatment or other patient management decisions. A negative result may occur with  improper specimen collection/handling, submission of specimen other than nasopharyngeal swab, presence of viral mutation(s) within the areas targeted by this assay, and inadequate number of viral copies (<131 copies/mL). A negative result must be combined with clinical observations, patient history, and epidemiological information. The expected result is Negative. Fact Sheet for Patients:  PinkCheek.be Fact Sheet for Healthcare Providers:  GravelBags.it This  test is not yet ap                          proved or cleared by the [place] FDA and  has been authorized for detection and/or diagnosis of SARS-CoV-2 by FDA under an Emergency Use Authorization (EUA). This EUA will remain  in effect (meaning this test can be used) for the duration of the COVID-19 declaration under Section 564(b)(1) of the Act, 21 U.S.C. section 360bbb-3(b)(1), unless the authorization is terminated or revoked sooner.   . Influenza A by PCR 02/14/2020 NEGATIVE  NEGATIVE Final  . Influenza B by PCR 02/14/2020 NEGATIVE  NEGATIVE Final   Comment: (NOTE) The Xpert Xpress SARS-CoV-2/FLU/RSV assay is intended as an aid in  the diagnosis of influenza from Nasopharyngeal swab specimens and  should not be used as a sole basis for treatment. Nasal washings and  aspirates are unacceptable for Xpert Xpress SARS-CoV-2/FLU/RSV  testing. Fact Sheet for Patients: PinkCheek.be Fact Sheet for Healthcare Providers: GravelBags.it This test is not yet approved or cleared by the [place] FDA and  has been authorized for detection and/or diagnosis of SARS-CoV-2 by  FDA under an Emergency Use Authorization (EUA). This  EUA will remain  in effect (meaning this test can be used) for the duration of the  Covid-19 declaration under Section 564(b)(1) of the Act, 21  U.S.C. section 360bbb-3(b)(1), unless the authorization is  terminated or revoked. Performed at [facility], [place] [address], [place], [place] [address]   . MRSA by PCR 02/14/2020 NEGATIVE  NEGATIVE Final   Comment:        The GeneXpert MRSA Assay (FDA approved for NASAL specimens only), is one component of a comprehensive MRSA colonization surveillance program. It is not intended to diagnose MRSA infection nor to guide or monitor treatment for MRSA infections. Performed at [facility], [place] [address] [place], [place] [address]   . JAK2  GenotypR 02/14/2020 Comment*  Final   Comment: (NOTE) Result:  POSITIVE for the detection of the V617F mutation. Interpretation:  The assay detected the presence of a G to T nucleotide change encoding the V617F mutation within JAK2. Interpretation of this result should be made in the context of other clinical, morphologic, and cytogenetic findings.   Marland Kitchen BACKGROUND: 02/14/2020 Comment   Final   Comment: (NOTE) JAK2 is a cytoplasmic tyrosine kinase with a key role in signal transduction from multiple hematopoietic growth factor receptors. A point mutation within exon 14 of the JAK2 gene ([id number]) encoding a valine to phenylalanine substitution at position 617 of the JAK2 protein (V617F) has been identified in most patients with polycythemia vera, and in about half of those with either essential thrombocythemia or idiopathic myelofibrosis. The V617F has also been detected, although infrequently, in other myeloid disorders such as chronic myelomonocytic leukemia and chronic neutrophilic luekemia. V617F is an acquired mutation that alters a highly conserved valine present in the negative regulatory JH2 domain of the JAK2 protein and is predicted to dysregulate kinase activity. Methodology: Total genomic DNA was extracted and subjected to TaqMan real-time PCR amplification/detection. Two amplification products per sample were monitored by real-time PCR using primers/probes s                          pecific to JAK2 wild type (WT) and JAK2 mutant V617F. The ABI7900 Absolute Quantitation software will compare the patient specimen valuse to the standard curves and generate percent values for wild type and mutant type. In vitro studies have indicated that this assay has an analytical sensitivity of 1%. References: Baxter EJ, Scott Phineas Real, et al. Acquired mutation of the tyrosine kinase JAK2 in human myeloproliferative disorders. Lancet. 2005 Mar 19-25; 365(9464):1054-1061. Alfonso Ramus Couedic JP. A unique clonal JAK2 mutation leading to constitutive signaling causes polycythaemia vera. Nature. 2005 Apr 28; 434(7037):1144-1148. Kralovics R, Passamonti F, Buser AS, et al. A gain-of-function mutation of JAK2 in myeloproliferative disorders. N Engl J Med. 2005 Apr 28; 352(17):1779-1790.   . Director Review, JAK2 02/14/2020 Comment   Final   Comment: (NOTE) Loni Muse, PhD, [facility]    Director, [place] for [place] and [place], [place] [address]    [phone] This test was developed and its performance characteristics determined by [facility]. It has not been cleared or approved by the Food and Drug Administration.   . Reflex: 02/14/2020 15 Mutation Analysis is not indicated.   Corrected   Comment: Comment Reflex to JAK2 Exon 12   . Extraction 02/14/2020 Completed   Corrected   Comment: (NOTE) Performed At: [facility] [address] [place], [place] [id number] Katina Degree MDPhD AT:[id number] Performed At: [facility] [place], [place] [id number] Katina Degree MDPhD [id number]   . Uric Acid, Serum 02/14/2020 5.4  2.5 - 7.1 mg/dL Final   Performed at [facility], [place] [address] [place], [place] [address]  . Erythropoietin 02/14/2020 1.3* 2.6 - 18.5 mIU/mL Final   Comment: (NOTE) Beckman Coulter UniCel DxI [place] obtained with different assay methods or kits cannot be used interchangeably. Results cannot be interpreted as absolute evidence of the presence or absence of malignant disease. Performed At: [facility] [place], [place] [id number] Rush Farmer MD OJ:[id number]   . Coagulation Factor VIII 02/14/2020 142* 56 - 140 % Final  . Ristocetin Co-factor, Plasma 02/14/2020 31* 50 - 200 % Final   Comment: (NOTE) The VWF:RCo assay demonstrates significant variability and slightly low values are commonly seen due to  preanalytical and analytical variables.  VWF:RCo may be spuriously low in individuals with certain polymorphisms in the VWF gene (e.g. [id number]) that alter the binding of ristocetin to VWF.  These polymorphisms have been reported in 17% of whites and 63% of African Americans without a history of a bleeding disorder (Blood.  2010; 116(2):280-286). Performed At: [facility] [place], [place] [id number] Rush Farmer MD [id number]   . Von Willebrand Antigen, Plasma 02/14/2020 121  50 - 200 % Final   Comment: (NOTE) This test was developed and its performance characteristics determined by [facility]. It has not been cleared or approved by the Food and Drug Administration.   . Ferritin 02/14/2020 75  11 - 307 ng/mL Final   Performed at [facility], [place] [address], [place], [place] [address]  . Iron 02/14/2020 73  28 - 170 ug/dL Final  . TIBC 02/14/2020 358  250 - 450 ug/dL Final  . Saturation Ratios 02/14/2020 20  10.4 - 31.8 % Final  . UIBC 02/14/2020 285  ug/dL Final   Performed at [facility], [place] [address], [place], [place] [address]  . Weight 02/14/2020 2,480  oz Final  . Height 02/14/2020 62  in Final  . BP 02/14/2020 133/70  mmHg Final  . Hgb A1c MFr Bld 02/14/2020 6.1* 4.8 - 5.6 % Final   Comment: (NOTE) Pre diabetes:          5.7%-6.4% Diabetes:              >6.4% Glycemic control for   <7.0% adults with diabetes   . Mean Plasma Glucose 02/14/2020 128.37  mg/dL Final   Performed at [place] [address], [place], [place] [address]  . Vitamin B-12 02/14/2020 248  180 - 914 pg/mL Final   Comment: (NOTE) This assay is not validated for testing neonatal or myeloproliferative syndrome specimens for Vitamin B12 levels. Performed at [facility], [place] [address] [place], [place] [address]   . TSH 02/14/2020 1.581  0.350 - 4.500 uIU/mL Final   Comment: Performed by a 3rd Generation assay with a functional sensitivity  of <=0.01 uIU/mL. Performed at [facility], [place] [address] [place], [place] [address]   . Path Review 02/13/2020 Thrombocytosis.  Reactive appearing lymphocytes.   Final   Comment: Reviewed by Lennox Solders. Lyndon Code, M.D. 02/14/2020. Performed at [facility], [place] [address] [place], [place] [address]   . Cholesterol 02/14/2020 172  0 - 200 mg/dL  Final  . Triglycerides 02/14/2020 46  <150 mg/dL Final  . HDL 02/14/2020 71  >40 mg/dL Final  . Total CHOL/HDL Ratio 02/14/2020 2.4  RATIO Final  . VLDL 02/14/2020 9  0 - 40 mg/dL Final  . LDL Cholesterol 02/14/2020 92  0 - 99 mg/dL Final   Comment:        Total Cholesterol/HDL:CHD Risk Coronary Heart Disease Risk Table                     Men   Women  1/2 Average Risk   3.4   3.3  Average Risk       5.0   4.4  2 X Average Risk   9.6   7.1  3 X Average Risk  23.4   11.0        Use the calculated Patient Ratio above and the CHD Risk Table to determine the patient's CHD Risk.        ATP III CLASSIFICATION (LDL):  <100     mg/dL   Optimal  100-129  mg/dL   Near or Above                    Optimal  130-159  mg/dL   Borderline  160-189  mg/dL   High  >190     mg/dL   Very High Performed at [place] [address], [place], [place] [address]   . Sodium 02/14/2020 141  135 - 145 mmol/L Final  . Potassium 02/14/2020 3.7  3.5 - 5.1 mmol/L Final  . Chloride 02/14/2020 105  98 - 111 mmol/L Final  . CO2 02/14/2020 23  22 - 32 mmol/L Final  . Glucose, Bld 02/14/2020 129* 70 - 99 mg/dL Final   Glucose reference range applies only to samples taken after fasting for at least 8 hours.  . BUN 02/14/2020 18  8 - 23 mg/dL Final  . Creatinine, Ser 02/14/2020 0.94  0.44 - 1.00 mg/dL Final  . Calcium 02/14/2020 9.7  8.9 - 10.3 mg/dL Final  . GFR calc non Af Amer 02/14/2020 >60  >60 mL/min Final  . GFR calc Af Amer 02/14/2020 >60  >60 mL/min Final  . Anion gap 02/14/2020 13  5 - 15 Final   Performed at [facility], [place] [address] [place], [place] [address]  . WBC 02/15/2020 24.4* 4.0 - 10.5 K/uL Final  . RBC 02/15/2020 6.33* 3.87 - 5.11 MIL/uL Final  . Hemoglobin 02/15/2020 18.6* 12.0 - 15.0 g/dL Final   REPEATED TO VERIFY  . HCT 02/15/2020 58.5* 36.0 - 46.0 % Final   REPEATED TO VERIFY  . MCV 02/15/2020 92.4  80.0 - 100.0 fL Final  . MCH 02/15/2020 29.4  26.0 - 34.0 pg Final  . MCHC 02/15/2020 31.8  30.0 - 36.0 g/dL Final  . RDW 02/15/2020 15.1  11.5 - 15.5 % Final  . Platelets 02/15/2020 738* 150 - 400 K/uL Final  . nRBC 02/15/2020 0.0  0.0 - 0.2 % Final  . Neutrophils Relative % 02/15/2020 88  % Final  . Neutro Abs 02/15/2020 21.3* 1.7 - 7.7 K/uL Final  . Lymphocytes Relative 02/15/2020 5  % Final  . Lymphs Abs 02/15/2020 1.2  0.7 - 4.0 K/uL Final  . Monocytes Relative 02/15/2020 6  % Final  . Monocytes Absolute 02/15/2020 1.5* 0.1 - 1.0 K/uL Final  . Eosinophils Relative 02/15/2020 0  % Final  . Eosinophils Absolute 02/15/2020  0.0  0.0 - 0.5 K/uL Final  . Basophils Relative 02/15/2020 0  % Final  . Basophils Absolute 02/15/2020 0.1  0.0 - 0.1 K/uL Final  . Immature Granulocytes 02/15/2020 1  % Final  . Abs Immature Granulocytes 02/15/2020 0.27* 0.00 - 0.07 K/uL Final   Performed at [facility], [place] [address], [place], [place] [address]  . SURGICAL PATHOLOGY 02/15/2020    Final-Edited                   Value:Surgical Pathology CASE: [id number] PATIENT: [place] Crute Bone Marrow Report     Clinical History: polycythemia     DIAGNOSIS:  BONE MARROW, ASPIRATE, CLOT, CORE: -Hypercellular bone marrow for age with pan myeloid proliferation -See comment  PERIPHERAL BLOOD: -Erythrocytosis -Leukocytosis -Thrombocytosis  COMMENT:  The bone marrow is hypercellular for age with pan myeloid proliferation with no increase in blastic cells. The features favor a myeloproliferative neoplasm particularly polycythemia vera.  Correlation with JAK2 mutation analysis and cytogenetic  studies is recommended.  MICROSCOPIC DESCRIPTION:  PERIPHERAL BLOOD SMEAR: The red blood cells display mild anisopoikilocytosis with mild polychromasia.  The white blood cells are increased in number, mostly with neutrophils some of which display mild toxic granulation.  The platelets are increased in number.  BONE MARROW ASPIRATE: Bone marrow particles present Erythroid precursors: Orderly                          and progressive maturation Granulocytic precursors: Orderly and progressive maturation.  No increase in blasts identified. Megakaryocytes: Increased in number with anisocytosis including normal forms and many large hyperlobated and hyperchromatic forms. Lymphocytes/plasma cells: Large aggregates not present  TOUCH PREPARATIONS: A mixture of cell types present  CLOT AND BIOPSY: The sections show 40 to 70% cellularity with a mixture of myeloid cell types including increased number of megakaryocytes with clustering.  Many of the megakaryocytes display large and atypical morphology with hyperlobation or hyperchromasia.  Expansile sheets of blastic cells are not identified.  Occasional small interstitial and well-circumscribed lymphoid aggregates mostly composed of small lymphocytes are seen. Reticulin stain was performed on block C1 with appropriate control.  There is patchy minimal increase in reticulin fibers at best.  IRON STAIN: Iron stains are performed on a bone m                         arrow aspirate or touch imprint smear and section of clot. The controls stained appropriately.       Storage Iron: Abundant      Ring Sideroblasts: Absent  ADDITIONAL DATA/TESTING: The specimen was sent for cytogenetic analysis and a separate report will follow.  Flow cytometric analysis [phone]) failed show a significant blastic population or monoclonal B-cell population.  T-cells show nonspecific changes with relative abundance of CD4 positive cells admixed with a  relatively abundant population of natural killer cells.   CELL COUNT DATA:  Bone Marrow count performed on 500 cells shows: Blasts:   0%   Myeloid:  60% Promyelocytes: 1%   Erythroid:     23% Myelocytes:    12%  Lymphocytes:   12% Metamyelocytes:     0%   Plasma cells:  1% Bands:    17% Neutrophils:   28%  M:E ratio:     2.6 Eosinophils:   2% Basophils:     0% Monocytes:     4%  Lab Data: CBC performed on  02/15/2020 shows: RBC:      6.33 Mil/uL WBC: 24.4 k/uL Neutrophils:   93% Hgb: 18.6 g/d                         L Lymphocytes:   3% HCT: 58.5 %    Monocytes:     4% MCV: 92.4 fL   Eosinophils:   0% RDW: 15.1 %    Basophils:     0% PLT: 738 k/uL    GROSS DESCRIPTION:  A: Aspirate smear  B: The specimen is received in B-plus fixative and consists of an 8.0 x 8.0 x 2.0 mm aggregate of red-brown clotted blood.  The specimen is entirely submitted in 1 cassette.  C: The specimen is received in B-plus fixative and consists of a 2.9 x 0.9 x 0.3 cm aggregate of red-brown clotted blood and bone.  The specimen is entirely submitted in 1 cassette following decalcification with Immunocal. Craig Staggers 02/17/2020)    Final Diagnosis performed by Susanne Greenhouse, MD.   Electronically signed 02/17/2020 Technical and / or Professional components performed at [facility], [place] [address], [place], [place] [address].  Immunohistochemistry Technical component (if applicable) was performed at [facility]. [address], [place], [place],  [address].   [place] (if applicable): Some of these immunohistochemical stains may have been developed and the performance characteristics determine by [facility]. Some may not have been cleared or approved by the U.S. Food and Drug Administration. The FDA has determined that such clearance or approval is not necessary. This test is used for clinical  purposes. It should not be regarded as investigational or for research. This laboratory is certified under the [place] (CLIA-88) as qualified to perform high complexity clinical laboratory testing.  The controls stained appropriately.   . Interpretation 02/14/2020 Note   Final   Comment: (NOTE) ------------------------------- COAGULATION: VON WILLEBRAND FACTOR ASSESSMENT CURRENT RESULTS ASSESSMENT The VWF:Ag is normal. The VWF:RCo is slightly decreased. The FVIII is elevated. VON WILLEBRAND FACTOR ASSESSMENT CURRENT RESULTS INTERPRETATION - Although the results are abnormal, the panel does not meet the laboratory criteria for VWD. The 2008 NHLBI VWD guideline (summary in Am J Hematol. 2009; 84(6):366-370) suggests that a diagnosis of VWD (excluding type 2N) be reserved for patients with VWF levels that fall below 30%. Results in the range of 30 to 50% may be associated with an increased risk for bleeding, and do not preclude a VWD diagnosis if supporting clinical history is present, nor preclude therapy to elevate VWF levels with a clinical bleeding risk. VWF:RCo/VWF:Ag ratio is less than 0.7 and for this reason type 2 VWD, including type 2A, 2B, and 79M, or platelet-type VWD cannot be excluded. Distinction between type 1 and type 2 VWD should not b                          e made on VWF:RCo/VWF:Ag ratios only as there may be significant overlap between type 1 and type 2 VWD especially when ratios are between 0.5 and 0.7. In an older population, with recent onset of bleeding diathesis, acquired von Willebrand syndrome is more likely than congenital deficiency. Possible etiologies include hypothyroidism, lymphoproliferative disorders, myeloproliferative disorders and certain cardiac conditions associated with increased shear  stress. This battery of assays does not distinguish acquired from congenital VWD. Persistently elevated FVIII  activity is a risk factor for venous thrombosis as well as recurrence of venous thrombosis. Risk is graded and increases with the degree of elevation. Although elevated FVIII activity has been identified to cluster within families, a genetic basis for the elevation has not yet been elucidated (Br J Haematol. 2012; 157(6):653-663). VON WILLEBRAND FACTOR ASSESSMENT - The VWF:RCo assay demonstrates significant v                          ariability and falsely low values are commonly seen due to preanalytical and analytical variables. VWF:RCo may be spuriously low in individuals with polymorphisms (reported in 17% of whites and 63% of African Americans) in the VWF gene (clinical testing not available) that alter the binding of ristocetin to VWF. (Blood. 2010; 116(2):280-286). VWF activity can also be measured using a collagen binding assay, which is labeled for research use only, but does not show interference due to these polymorphisms. Results may be falsely elevated and possibly falsely normal as VWF and FVIII may increase in samples drawn from patients (particularly children) who are visibly stressed at the time of phlebotomy, as acute phase reactants, or in response to certain drug therapies such as desmopressin. Repeat testing may be necessary before excluding a diagnosis of VWD especially if the clinical suspicion is high for an underlying bleeding disorder. The setting for phlebotomy should be                           as calm as possible and patients should be encouraged to sit quietly prior to the blood draw. VON WILLEBRAND FACTOR ASSESSMENT FURTHER CONSIDERATIONS - Consider repeat analysis on a new plasma sample to re-evaluate this pattern of results. If this pattern of results persists, further analysis will be needed to characterize VWD type. VON WILLEBRAND FACTOR ASSESSMENT DEFINITIONS - VWD - von Willebrand disease; VWF - von Willebrand factor; VWF:Ag - VWF antigen;  VWF:RCo - VWF ristocetin cofactor activity; FVIII - factor VIII activity. - MEDICAL DIRECTOR: For questions regarding panel interpretation, please contact Jake Bathe, M.D. at [facility] at [phone]. ------------------------------- DISCLAIMER These assessments and interpretations are provided as a convenience in support of the physician-patient relationship and are not intended to replace the physician's clinical judgment. They are derived from national guidelines in addition to ot                          her evidence and expert opinion. The clinician should consider this information within the context of clinical opinion and the individual patient. SEE GUIDANCE FOR VON WILLEBRAND FACTOR ASSESSMENT: (1) The National Heart, Lung and Blood Institute. The Diagnosis, Evaluation and Management of von Willebrand Disease. Janeal Holmes, MD: [place] Publication 86-7619. 5093. Available at vSpecials.com.pt. (2) Daryl Eastern et al. [place] J Hematol. 2009; 84(6):366-370. (3) [place]. 2004;10(3):199-217. (4) Pasi KJ et al. Haemophilia. 2004; 10(3):218-231. Performed At: [facility] [place], [place] [id number] Thomasene Ripple MD DX:[id number]   . WBC 02/16/2020 17.9* 4.0 - 10.5 K/uL Final   REPEATED TO VERIFY  . RBC 02/16/2020 5.42* 3.87 - 5.11 MIL/uL Final  . Hemoglobin 02/16/2020 16.2* 12.0 - 15.0 g/dL Final   REPEATED TO VERIFY  . HCT 02/16/2020 51.2* 36.0 - 46.0 % Final  .  MCV 02/16/2020 94.5  80.0 - 100.0 fL Final  . MCH 02/16/2020 29.9  26.0 - 34.0 pg Final  . MCHC 02/16/2020 31.6  30.0 - 36.0 g/dL Final  . RDW 02/16/2020 14.7  11.5 - 15.5 % Final  . Platelets 02/16/2020 691* 150 - 400 K/uL Final   REPEATED TO VERIFY  . nRBC 02/16/2020 0.0  0.0 - 0.2 % Final  . Neutrophils Relative % 02/16/2020 81  % Final  . Neutro Abs 02/16/2020 14.8* 1.7 - 7.7 K/uL Final  .  Lymphocytes Relative 02/16/2020 8  % Final  . Lymphs Abs 02/16/2020 1.4  0.7 - 4.0 K/uL Final  . Monocytes Relative 02/16/2020 9  % Final  . Monocytes Absolute 02/16/2020 1.6* 0.1 - 1.0 K/uL Final  . Eosinophils Relative 02/16/2020 0  % Final  . Eosinophils Absolute 02/16/2020 0.0  0.0 - 0.5 K/uL Final  . Basophils Relative 02/16/2020 1  % Final  . Basophils Absolute 02/16/2020 0.1  0.0 - 0.1 K/uL Final  . Immature Granulocytes 02/16/2020 1  % Final  . Abs Immature Granulocytes 02/16/2020 0.10* 0.00 - 0.07 K/uL Final   Performed at [facility], [place] [address], [place], [place] [address]  . Sodium 02/16/2020 142  135 - 145 mmol/L Final  . Potassium 02/16/2020 3.5  3.5 - 5.1 mmol/L Final  . Chloride 02/16/2020 108  98 - 111 mmol/L Final  . CO2 02/16/2020 24  22 - 32 mmol/L Final  . Glucose, Bld 02/16/2020 134* 70 - 99 mg/dL Final   Glucose reference range applies only to samples taken after fasting for at least 8 hours.  . BUN 02/16/2020 15  8 - 23 mg/dL Final  . Creatinine, Ser 02/16/2020 0.77  0.44 - 1.00 mg/dL Final  . Calcium 02/16/2020 8.5* 8.9 - 10.3 mg/dL Final  . GFR calc non Af Amer 02/16/2020 >60  >60 mL/min Final  . GFR calc Af Amer 02/16/2020 >60  >60 mL/min Final  . Anion gap 02/16/2020 10  5 - 15 Final   Performed at [facility], [place] [address], [place], [place] [address]  . Sodium 02/16/2020 143  135 - 145 mmol/L Final  . Potassium 02/16/2020 3.3* 3.5 - 5.1 mmol/L Final  . Chloride 02/16/2020 104  98 - 111 mmol/L Final  . BUN 02/16/2020 16  8 - 23 mg/dL Final  . Creatinine, Ser 02/16/2020 3.50* 0.44 - 1.00 mg/dL Final  . Glucose, Bld 02/16/2020 143* 70 - 99 mg/dL Final   Glucose reference range applies only to samples taken after fasting for at least 8 hours.  . Calcium, Ion 02/16/2020 1.24  1.15 - 1.40 mmol/L Final  . TCO2 02/16/2020 27  22 - 32 mmol/L Final  . Hemoglobin 02/16/2020 16.0* 12.0 - 15.0 g/dL Final  . HCT 02/16/2020 47.0* 36.0 - 46.0 % Final    . Sodium 02/16/2020 141  135 - 145 mmol/L Final  . Potassium 02/16/2020 3.5  3.5 - 5.1 mmol/L Final  . Chloride 02/16/2020 111  98 - 111 mmol/L Final  . CO2 02/16/2020 22  22 - 32 mmol/L Final  . Glucose, Bld 02/16/2020 129* 70 - 99 mg/dL Final   Glucose reference range applies only to samples taken after fasting for at least 8 hours.  . BUN 02/16/2020 13  8 - 23 mg/dL Final  . Creatinine, Ser 02/16/2020 0.80  0.44 - 1.00 mg/dL Final   DELTA CHECK NOTED  . Calcium 02/16/2020 9.0  8.9 -  10.3 mg/dL Final  . Phosphorus 02/16/2020 2.3* 2.5 - 4.6 mg/dL Final  . Albumin 02/16/2020 3.8  3.5 - 5.0 g/dL Final  . GFR calc non Af Amer 02/16/2020 >60  >60 mL/min Final  . GFR calc Af Amer 02/16/2020 >60  >60 mL/min Final  . Anion gap 02/16/2020 8  5 - 15 Final   Performed at [facility], [place] [address], [place], [place] [address]  . WBC 02/17/2020 17.8* 4.0 - 10.5 K/uL Final  . RBC 02/17/2020 5.16* 3.87 - 5.11 MIL/uL Final  . Hemoglobin 02/17/2020 15.4* 12.0 - 15.0 g/dL Final  . HCT 02/17/2020 47.7* 36.0 - 46.0 % Final  . MCV 02/17/2020 92.4  80.0 - 100.0 fL Final  . MCH 02/17/2020 29.8  26.0 - 34.0 pg Final  . MCHC 02/17/2020 32.3  30.0 - 36.0 g/dL Final  . RDW 02/17/2020 14.3  11.5 - 15.5 % Final  . Platelets 02/17/2020 669* 150 - 400 K/uL Final  . nRBC 02/17/2020 0.0  0.0 - 0.2 % Final  . Neutrophils Relative % 02/17/2020 82  % Final  . Neutro Abs 02/17/2020 14.7* 1.7 - 7.7 K/uL Final  . Lymphocytes Relative 02/17/2020 9  % Final  . Lymphs Abs 02/17/2020 1.6  0.7 - 4.0 K/uL Final  . Monocytes Relative 02/17/2020 7  % Final  . Monocytes Absolute 02/17/2020 1.3* 0.1 - 1.0 K/uL Final  . Eosinophils Relative 02/17/2020 0  % Final  . Eosinophils Absolute 02/17/2020 0.0  0.0 - 0.5 K/uL Final  . Basophils Relative 02/17/2020 1  % Final  . Basophils Absolute 02/17/2020 0.1  0.0 - 0.1 K/uL Final  . Immature Granulocytes 02/17/2020 1  % Final  . Abs Immature Granulocytes 02/17/2020 0.12*  0.00 - 0.07 K/uL Final   Performed at [facility], [place] [address], [place], [place] [address]  . Uric Acid, Serum 02/17/2020 3.2  2.5 - 7.1 mg/dL Final   Performed at [facility], [place] [address], [place], [place] [address]  . SURGICAL PATHOLOGY 02/15/2020    Final-Edited                   Value:Surgical Pathology CASE: [id number] PATIENT: Iyani Whiston Flow Pathology Report     Clinical history: polycythemia     DIAGNOSIS:  -No significant blastic population identified. -No monoclonal B-cell population identified. -Relative abundance of natural killer cells -T-cells with nonspecific changes.  COMMENT:  Flow cytometric analysis of the lymphoid population representing 7% of all cells shows predominance of T lymphocytes with relative abundance of CD4 positive cells associated with an increase in the CD4:CD8 ratio. This is admixed with a relatively abundant population of natural killer cells.  B cells represent a minor population with no monoclonality or abnormal phenotype.  The lymphoid findings are limited and not considered specific or diagnostic of a lymphoproliferative process.  A significant blastic population is not identified.   GATING AND PHENOTYPIC ANALYSIS:  Gated population: Flow cytometric immunophenotyping is performed using an                         tibodies to the antigens listed in the table below. Electronic gates are placed around a cell cluster displaying light scatter properties corresponding to: lymphocytes and blasts  Abnormal Cells in gated population: See comment  Phenotype of Abnormal Cells: See comment  Lymphoid Antigens       Myeloid Antigens Miscellaneous CD2  tested    CD10 tested    CD11b     tested    CD45 tested CD3  tested    CD19 tested    CD11c     ND   HLA-Dr    tested CD4  tested    CD20 tested    CD13 tested    CD34 tested CD5  tested    CD22 ND   CD14 tested    CD38 tested CD7   tested    CD79b     ND   CD15 tested    CD138     ND CD8  tested    CD103     ND   CD16 tested    TdT  ND CD25 ND   CD200     tested    CD33 tested    CD123     tested TCRab     ND   sKappa    tested    CD64 tested    CD41 ND TCRgd     tested    sLambda   tested    CD117     tested    CD61 ND CD56 tested    cKappa    ND   MPO  ND   CD71 ND CD57 ND   cLambda   ND             CD235a    ND                              GROSS DESCRIPTION:  Reference Bone Marrow case [id number].    Final Diagnosis performed by Susanne Greenhouse, MD.   Electronically signed 02/17/2020 Technical and / or Professional components performed at [facility], [place] [address], [place], [place] [address].  The above tests were developed and their performance characteristics determined by the [facility] system for the physical and immunophenotypic characterization of cell populations. They have not been cleared by the U.S. Food and Drug administration. The  FDA has determined that such clearance or approval is not necessary. This test is used for clinical purposes. It should not be  regarded as investigational or for research   . WBC 02/18/2020 13.9* 4.0 - 10.5 K/uL Final  . RBC 02/18/2020 5.49* 3.87 - 5.11 MIL/uL Final  . Hemoglobin 02/18/2020 16.1* 12.0 - 15.0 g/dL Final  . HCT 02/18/2020 49.4* 36.0 - 46.0 % Final  . MCV 02/18/2020 90.0  80.0 - 100.0 fL Final  . MCH 02/18/2020 29.3  26.0 - 34.0 pg Final  . MCHC 02/18/2020 32.6  30.0 - 36.0 g/dL Final  . RDW 02/18/2020 13.8  11.5 - 15.5 % Final  . Platelets 02/18/2020 721* 150 - 400 K/uL Final  . nRBC 02/18/2020 0.0  0.0 - 0.2 % Final  . Neutrophils Relative % 02/18/2020 76  % Final  . Neutro Abs 02/18/2020 10.7* 1.7 - 7.7 K/uL Final  . Lymphocytes Relative 02/18/2020 13  % Final  . Lymphs Abs 02/18/2020 1.8  0.7 - 4.0 K/uL Final  . Monocytes Relative 02/18/2020 8  % Final  . Monocytes Absolute 02/18/2020 1.1* 0.1 - 1.0 K/uL Final  .  Eosinophils Relative 02/18/2020 1  % Final  . Eosinophils Absolute 02/18/2020 0.2  0.0 - 0.5 K/uL Final  . Basophils Relative 02/18/2020 1  % Final  . Basophils Absolute  02/18/2020 0.1  0.0 - 0.1 K/uL Final  . Immature Granulocytes 02/18/2020 1  % Final  . Abs Immature Granulocytes 02/18/2020 0.10* 0.00 - 0.07 K/uL Final   Performed at [place] [address], [place], [place] [address]  . Uric Acid, Serum 02/18/2020 2.5  2.5 - 7.1 mg/dL Final   Performed at [place] [address], [place], [place] [address]    (this displays the last labs from the last 3 days)  No results found for: TOTALPROTELP, ALBUMINELP, A1GS, A2GS, BETS, BETA2SER, GAMS, MSPIKE, SPEI (this displays SPEP labs)  No results found for: KPAFRELGTCHN, LAMBDASER, KAPLAMBRATIO (kappa/lambda light chains)  No results found for: HGBA, HGBA2QUANT, HGBFQUANT, HGBSQUAN (Hemoglobinopathy evaluation)   No results found for: LDH  Lab Results  Component Value Date   IRON 73 02/14/2020   TIBC 358 02/14/2020   IRONPCTSAT 20 02/14/2020   (Iron and TIBC)  Lab Results  Component Value Date   FERRITIN 75 02/14/2020    Urinalysis    Component Value Date/Time   COLORURINE YELLOW 08/15/2014 2129   APPEARANCEUR CLEAR 08/15/2014 2129   LABSPEC 1.013 08/15/2014 2129   PHURINE 6.0 08/15/2014 2129   [place] 08/15/2014 2129   [place] NEGATIVE 08/15/2014 2129   BILIRUBINUR NEGATIVE 08/15/2014 2129   KETONESUR 15 (A) 08/15/2014 2129   PROTEINUR NEGATIVE 08/15/2014 2129   UROBILINOGEN 0.2 08/15/2014 2129   NITRITE NEGATIVE 08/15/2014 2129   LEUKOCYTESUR NEGATIVE 08/15/2014 2129     STUDIES: CT HEAD WO CONTRAST  Result Date: 02/16/2020 CLINICAL DATA:  Intracranial hemorrhage, follow-up EXAM: CT HEAD WITHOUT CONTRAST TECHNIQUE: Contiguous axial images were obtained from the base of the skull through the vertex without intravenous contrast. COMPARISON:  02/14/2020 FINDINGS: Brain:  Intraventricular hemorrhage is again identified primarily within the right lateral ventricle. There is no new hemorrhage. Caliber of the ventricles is similar. No new loss of gray differentiation. Confluent hypoattenuation is again identified supratentorial white matter. Vascular: No new finding. Skull: Remains unremarkable. Sinuses/Orbits: No acute finding. Other: None. IMPRESSION: No substantial change in intraventricular hemorrhage primarily within the right lateral ventricle. No new hemorrhage. Stable caliber of the ventricles. Electronically Signed   By: Macy Mis M.D.   On: 02/16/2020 07:09   CT HEAD WO CONTRAST  Result Date: 02/14/2020 CLINICAL DATA:  Follow-up hemorrhage EXAM: CT HEAD WITHOUT CONTRAST TECHNIQUE: Contiguous axial images were obtained from the base of the skull through the vertex without intravenous contrast. COMPARISON:  Earlier same day, 8 hours ago. FINDINGS: Brain: Intraventricular hemorrhage within the right lateral ventricle persists, approximately the same volume without evidence of ongoing or additional bleeding. Small amount of blood present within the fourth ventricle and left lateral ventricle. Small amount of subarachnoid blood visible within a left parietal sulcus. No overall change in ventricular size. Widespread chronic microangiopathic change of the white matter as seen previously. Old small vessel infarction in the right basal ganglia. No sign of acute infarction. I do not identify an intraparenchymal source. Vascular: There is atherosclerotic calcification of the major vessels at the base of the brain. Skull: Negative Sinuses/Orbits: Clear/normal Other: None IMPRESSION: No significant change since 8 hours ago. Intraventricular hemorrhage primarily within the right lateral ventricle. No evidence of ongoing bleeding. Small amount of blood dependent within the left lateral ventricle and within the fourth ventricle. Ventricular size is stable. Small amount of subarachnoid  blood evident in a left parietal sulcus. Electronically Signed   By: Nelson Chimes M.D.   On:  02/14/2020 08:57   CT Head Wo Contrast  Result Date: 02/14/2020 CLINICAL DATA:  Acute onset headache EXAM: CT HEAD WITHOUT CONTRAST TECHNIQUE: Contiguous axial images were obtained from the base of the skull through the vertex without intravenous contrast. COMPARISON:  None. FINDINGS: Brain: There is acute hemorrhage within the right lateral ventricle. No definite intraparenchymal component is identified. There is severe chronic white matter disease, most commonly indicating chronic ischemic microangiopathy. Volume of CSF spaces is normal. No midline shift or other mass effect Vascular: No abnormal hyperdensity of the major intracranial arteries or dural venous sinuses. No intracranial atherosclerosis. Skull: The visualized skull base, calvarium and extracranial soft tissues are normal. Sinuses/Orbits: No fluid levels or advanced mucosal thickening of the visualized paranasal sinuses. No mastoid or middle ear effusion. The orbits are normal. IMPRESSION: 1. Acute intraventricular hemorrhage within the right lateral ventricle. No definite parenchymal component identified. This is an unusual pattern, but is perhaps less unlikely in the setting of this patient's abnormal coagulation status. 2. Severe chronic ischemic microangiopathy. Critical Value/emergent results were called by telephone at the time of interpretation on 02/14/2020 at 12:31 am to provider [facility] , who verbally acknowledged these results. Electronically Signed   By: Ulyses Jarred M.D.   On: 02/14/2020 00:32   MR ANGIO HEAD WO CONTRAST  Result Date: 02/14/2020 CLINICAL DATA:  Follow-up cerebral hemorrhage. EXAM: MRI HEAD WITHOUT CONTRAST MRA HEAD WITHOUT CONTRAST TECHNIQUE: Multiplanar, multiecho pulse sequences of the brain and surrounding structures were obtained without intravenous contrast. Angiographic images of the head were obtained using MRA  technique without contrast. COMPARISON:  Head CT February 14, 2020 6 FINDINGS: MRI HEAD FINDINGS Brain: Redemonstrated right intraventricular hemorrhage with extension into the third and fourth ventricle as well as the occipital horn of left lateral ventricle. The ventricular size remains stable. No midline shift. A loculated fluid collection measuring approximately 1 cm with hematocrit level is noted in the left parietal region at midline (series 11, image 23). Extensive confluent foci of T2 hyperintensity are seen within the white matter of the cerebral hemispheres, basal ganglia, thalami and pons, nonspecific. Scattered punctate foci of susceptibility artifact are seen the bilateral cerebral hemispheres predominantly in subcortical location, bilateral thalami, pons and cerebellar hemispheres, likely representing hemosiderin deposit. This appears progressed since prior MRI. Vascular: Normal flow voids. Skull and upper cervical spine: Normal marrow signal. Sinuses/Orbits: Mild mucosal thickening of the right maxillary sinus. The orbits are maintained. Other: None. MRA HEAD FINDINGS The visualized portions of the distal cervical and intracranial internal carotid arteries are widely patent with normal flow related enhancement. The bilateral anterior cerebral arteries and middle cerebral arteries are widely patent with antegrade flow without high-grade flow-limiting stenosis or proximal branch occlusion. No intracranial aneurysm within the anterior circulation. The vertebral arteries are widely patent with antegrade flow. The posterior inferior cerebral arteries are normal. Redemonstrated is a fenestration of the bilateral A1 segments. Vertebrobasilar junction and basilar artery are widely patent with antegrade flow without evidence of basilar stenosis or aneurysm. Posterior cerebral arteries are normal bilaterally. No intracranial aneurysm within the posterior circulation. IMPRESSION: 1. Stable right intraventricular  hemorrhage with extension into the third and fourth ventricle. Stable ventricular size. No evidence of hydrocephalus. 2. Extensive confluent T2 hyperintensity within the white matter of the cerebral hemispheres, basal ganglia, thalami, and pons, nonspecific but may be related to chronic small vessel ischemic changes. 3. Multiple foci of susceptibility artifact throughout the brain parenchyma, progressed from prior MRI. These may be related to  microangiopathy versus amyloid angiopathy. Electronically Signed   By: Pedro Earls M.D.   On: 02/14/2020 14:54   MR BRAIN WO CONTRAST  Result Date: 02/14/2020 CLINICAL DATA:  Follow-up cerebral hemorrhage. EXAM: MRI HEAD WITHOUT CONTRAST MRA HEAD WITHOUT CONTRAST TECHNIQUE: Multiplanar, multiecho pulse sequences of the brain and surrounding structures were obtained without intravenous contrast. Angiographic images of the head were obtained using MRA technique without contrast. COMPARISON:  Head CT February 14, 2020 6 FINDINGS: MRI HEAD FINDINGS Brain: Redemonstrated right intraventricular hemorrhage with extension into the third and fourth ventricle as well as the occipital horn of left lateral ventricle. The ventricular size remains stable. No midline shift. A loculated fluid collection measuring approximately 1 cm with hematocrit level is noted in the left parietal region at midline (series 11, image 23). Extensive confluent foci of T2 hyperintensity are seen within the white matter of the cerebral hemispheres, basal ganglia, thalami and pons, nonspecific. Scattered punctate foci of susceptibility artifact are seen the bilateral cerebral hemispheres predominantly in subcortical location, bilateral thalami, pons and cerebellar hemispheres, likely representing hemosiderin deposit. This appears progressed since prior MRI. Vascular: Normal flow voids. Skull and upper cervical spine: Normal marrow signal. Sinuses/Orbits: Mild mucosal thickening of the right  maxillary sinus. The orbits are maintained. Other: None. MRA HEAD FINDINGS The visualized portions of the distal cervical and intracranial internal carotid arteries are widely patent with normal flow related enhancement. The bilateral anterior cerebral arteries and middle cerebral arteries are widely patent with antegrade flow without high-grade flow-limiting stenosis or proximal branch occlusion. No intracranial aneurysm within the anterior circulation. The vertebral arteries are widely patent with antegrade flow. The posterior inferior cerebral arteries are normal. Redemonstrated is a fenestration of the bilateral A1 segments. Vertebrobasilar junction and basilar artery are widely patent with antegrade flow without evidence of basilar stenosis or aneurysm. Posterior cerebral arteries are normal bilaterally. No intracranial aneurysm within the posterior circulation. IMPRESSION: 1. Stable right intraventricular hemorrhage with extension into the third and fourth ventricle. Stable ventricular size. No evidence of hydrocephalus. 2. Extensive confluent T2 hyperintensity within the white matter of the cerebral hemispheres, basal ganglia, thalami, and pons, nonspecific but may be related to chronic small vessel ischemic changes. 3. Multiple foci of susceptibility artifact throughout the brain parenchyma, progressed from prior MRI. These may be related to microangiopathy versus amyloid angiopathy. Electronically Signed   By: Pedro Earls M.D.   On: 02/14/2020 14:54   DG CHEST PORT 1 VIEW  Result Date: 02/14/2020 CLINICAL DATA:  Status post PICC placement EXAM: PORTABLE CHEST 1 VIEW COMPARISON:  August 15, 2014 FINDINGS: The heart size and mediastinal contours are within normal limits. Aortic knob calcifications are seen. A right-sided central venous catheter seen with the tip at the superior cavoatrial junction. No pneumothorax. No pleural effusion. No acute osseous abnormality. IMPRESSION: Interval  placement of right-sided central venous catheter with the tip at the superior cavoatrial junction. Electronically Signed   By: Prudencio Pair M.D.   On: 02/14/2020 13:09   CT BONE MARROW BIOPSY & ASPIRATION  Result Date: 02/15/2020 CLINICAL DATA:  Polycythemia EXAM: CT GUIDED DEEP ILIAC BONE ASPIRATION AND CORE BIOPSY TECHNIQUE: Patient was placed prone on the CT gantry and limited axial scans through the pelvis were obtained. Appropriate skin entry site was identified. Skin site was marked, prepped with chlorhexidine, draped in usual sterile fashion, and infiltrated locally with 1% lidocaine. Intravenous Fentanyl 36mg and Versed 173mwere administered as conscious sedation during continuous monitoring  of the patient's level of consciousness and physiological / cardiorespiratory status by the radiology RN, with a total moderate sedation time of 12 minutes. Under CT fluoroscopic guidance an 11-gauge Cook trocar bone needle was advanced into the right iliac bone just lateral to the sacroiliac joint. Once needle tip position was confirmed, core and aspiration samples were obtained, submitted to pathology for approval. Post procedure scans show no hematoma or fracture. Patient tolerated procedure well. COMPLICATIONS: COMPLICATIONS none IMPRESSION: 1. Technically successful CT guided right iliac bone core and aspiration biopsy. Electronically Signed   By: Lucrezia Europe M.D.   On: 02/15/2020 15:11   ECHOCARDIOGRAM COMPLETE  Result Date: 02/14/2020    ECHOCARDIOGRAM REPORT   Patient Name:   KINETA FUDALA Date of Exam: 02/14/2020 Medical Rec #:  [id number]        Height:       62.0 in Accession #:    [id number]       Weight:       155.0 lb Date of Birth:  11-12-1945       BSA:          1.715 m Patient Age:    74 years         BP:           121/56 mmHg Patient Gender: F                HR:           90 bpm. Exam Location:  Inpatient Procedure: 2D Echo Indications:    stroke 434.91  History:        Patient has prior history  of Echocardiogram examinations, most                 recent 08/16/2014. Risk Factors:Hypertension and Dyslipidemia.  Sonographer:    Johny Chess Referring Phys: [id number] [place] XU IMPRESSIONS  1. Left ventricular ejection fraction, by estimation, is 65 to 70%. The left ventricle has normal function. The left ventricle has no regional wall motion abnormalities. Left ventricular diastolic parameters were normal.  2. Right ventricular systolic function is normal. The right ventricular size is normal. Tricuspid regurgitation signal is inadequate for assessing PA pressure.  3. The mitral valve is grossly normal. No evidence of mitral valve regurgitation. No evidence of mitral stenosis.  4. The aortic valve is tricuspid. Aortic valve regurgitation is not visualized. Mild aortic valve sclerosis is present, with no evidence of aortic valve stenosis.  5. The inferior vena cava is normal in size with greater than 50% respiratory variability, suggesting right atrial pressure of 3 mmHg. Conclusion(s)/Recommendation(s): No intracardiac source of embolism detected on this transthoracic study. A transesophageal echocardiogram is recommended to exclude cardiac source of embolism if clinically indicated. FINDINGS  Left Ventricle: Left ventricular ejection fraction, by estimation, is 65 to 70%. The left ventricle has normal function. The left ventricle has no regional wall motion abnormalities. The left ventricular internal cavity size was normal in size. There is  no left ventricular hypertrophy. Left ventricular diastolic parameters were normal. Right Ventricle: The right ventricular size is normal. No increase in right ventricular wall thickness. Right ventricular systolic function is normal. Tricuspid regurgitation signal is inadequate for assessing PA pressure. Left Atrium: Left atrial size was normal in size. Right Atrium: Right atrial size was normal in size. Pericardium: There is no evidence of pericardial effusion.  Presence of pericardial fat pad. Mitral Valve: The mitral valve is grossly normal. Mild mitral annular calcification. No evidence of  mitral valve regurgitation. No evidence of mitral valve stenosis. Tricuspid Valve: The tricuspid valve is grossly normal. Tricuspid valve regurgitation is not demonstrated. No evidence of tricuspid stenosis. Aortic Valve: The aortic valve is tricuspid. Aortic valve regurgitation is not visualized. Mild aortic valve sclerosis is present, with no evidence of aortic valve stenosis. Pulmonic Valve: The pulmonic valve was grossly normal. Pulmonic valve regurgitation is not visualized. No evidence of pulmonic stenosis. Aorta: The aortic root is normal in size and structure. Venous: The inferior vena cava is normal in size with greater than 50% respiratory variability, suggesting right atrial pressure of 3 mmHg. IAS/Shunts: The atrial septum is grossly normal.  LEFT VENTRICLE PLAX 2D LVIDd:         4.40 cm  Diastology LVIDs:         2.70 cm  LV e' lateral:   9.46 cm/s LV PW:         0.90 cm  LV E/e' lateral: 8.6 LV IVS:        0.70 cm  LV e' medial:    8.05 cm/s LVOT diam:     2.00 cm  LV E/e' medial:  10.1 LV SV:         74 LV SV Index:   43 LVOT Area:     3.14 cm  RIGHT VENTRICLE RV S prime:     17.10 cm/s TAPSE (M-mode): 2.5 cm LEFT ATRIUM             Index       RIGHT ATRIUM           Index LA diam:        3.40 cm 1.98 cm/m  RA Area:     11.40 cm LA Vol (A2C):   26.9 ml 15.68 ml/m RA Volume:   23.90 ml  13.93 ml/m LA Vol (A4C):   38.5 ml 22.44 ml/m LA Biplane Vol: 33.5 ml 19.53 ml/m  AORTIC VALVE LVOT Vmax:   111.00 cm/s LVOT Vmean:  70.600 cm/s LVOT VTI:    0.234 m MITRAL VALVE MV Area (PHT): 3.12 cm    SHUNTS MV Decel Time: 243 msec    Systemic VTI:  0.23 m MV E velocity: 81.00 cm/s  Systemic Diam: 2.00 cm MV A velocity: 99.80 cm/s MV E/A ratio:  0.81 Eleonore Chiquito MD Electronically signed by Eleonore Chiquito MD Signature Date/Time: 02/14/2020/4:33:15 PM    Final    [place] EKG SITE  RITE  Result Date: 02/14/2020 If Site Rite image not attached, placement could not be confirmed due to current cardiac rhythm.   ELIGIBLE FOR AVAILABLE RESEARCH PROTOCOL: no  ASSESSMENT: 74 y.o. [place] woman presenting 02/13/2020 with headache, found to have a right lateral ventricle bleed, in the setting of panmyelosis, most c/w polycythemia vera.   (1) leukapheresis x3: 3d and final . leukapheresis scheduled for Friday 04/09 AM  (a) HD catheter to be removed after pheresis 02/18/2020-- order written and discussed with [facility]  (2) bone marrow biopsy 02/13/2020 shows no evidence of leukemia, c/w [facility]; additional confirmatory labs pending  (a) JAK2 mutation positive confirming diagnosis  (3) rule out acquired von Willebrand disease: borderling low ristocetin cofactor; does not meet criteria for vWD; will repeat once patient in remission  (4) intraventricular bleed: amyloid angiopathy? Other?  (a) outpatient neurologic follow-up?  PLAN: from my point of view patient can be discharged at your discretion after pheresis this AM-- she is already scheduled to see me Friday 02/25/2020 in AM and my  office will call her to confirm. I have called allopurinol and hydrea to her pharmacy.  She asked about outpatient neurologic follow-up-- will defer to you on that issue  Please let me know if I can be of further help  Victoria Cruel, MD   02/18/2020 8:43 AM

## 2020-02-19 ENCOUNTER — Inpatient Hospital Stay (HOSPITAL_COMMUNITY): Payer: Medicare HMO

## 2020-02-19 DIAGNOSIS — I615 Nontraumatic intracerebral hemorrhage, intraventricular: Secondary | ICD-10-CM | POA: Diagnosis not present

## 2020-02-19 LAB — CBC WITH DIFFERENTIAL/PLATELET
Abs Immature Granulocytes: 0.21 10*3/uL — ABNORMAL HIGH (ref 0.00–0.07)
Basophils Absolute: 0.1 10*3/uL (ref 0.0–0.1)
Basophils Relative: 1 %
Eosinophils Absolute: 0 10*3/uL (ref 0.0–0.5)
Eosinophils Relative: 0 %
HCT: 52.6 % — ABNORMAL HIGH (ref 36.0–46.0)
Hemoglobin: 17.3 g/dL — ABNORMAL HIGH (ref 12.0–15.0)
Immature Granulocytes: 1 %
Lymphocytes Relative: 7 %
Lymphs Abs: 1.5 10*3/uL (ref 0.7–4.0)
MCH: 30 pg (ref 26.0–34.0)
MCHC: 32.9 g/dL (ref 30.0–36.0)
MCV: 91.3 fL (ref 80.0–100.0)
Monocytes Absolute: 1.3 10*3/uL — ABNORMAL HIGH (ref 0.1–1.0)
Monocytes Relative: 6 %
Neutro Abs: 17.7 10*3/uL — ABNORMAL HIGH (ref 1.7–7.7)
Neutrophils Relative %: 85 %
Platelets: 654 10*3/uL — ABNORMAL HIGH (ref 150–400)
RBC: 5.76 MIL/uL — ABNORMAL HIGH (ref 3.87–5.11)
RDW: 14.4 % (ref 11.5–15.5)
WBC: 20.8 10*3/uL — ABNORMAL HIGH (ref 4.0–10.5)
nRBC: 0 % (ref 0.0–0.2)

## 2020-02-19 LAB — BASIC METABOLIC PANEL
Anion gap: 11 (ref 5–15)
BUN: 8 mg/dL (ref 8–23)
CO2: 21 mmol/L — ABNORMAL LOW (ref 22–32)
Calcium: 9 mg/dL (ref 8.9–10.3)
Chloride: 108 mmol/L (ref 98–111)
Creatinine, Ser: 0.86 mg/dL (ref 0.44–1.00)
GFR calc Af Amer: >60 mL/min (ref >60)
GFR calc non Af Amer: >60 mL/min (ref >60)
Glucose, Bld: 133 mg/dL — ABNORMAL HIGH (ref 70–99)
Potassium: 3.8 mmol/L (ref 3.5–5.1)
Sodium: 140 mmol/L (ref 135–145)

## 2020-02-19 LAB — URINALYSIS, COMPLETE (UACMP) WITH MICROSCOPIC
Bacteria, UA: NONE SEEN
Bilirubin Urine: NEGATIVE
Glucose, UA: NEGATIVE mg/dL
Hgb urine dipstick: NEGATIVE
Ketones, ur: 20 mg/dL — AB
Leukocytes,Ua: NEGATIVE
Nitrite: NEGATIVE
Protein, ur: NEGATIVE mg/dL
Specific Gravity, Urine: 1.015 (ref 1.005–1.030)
pH: 6 (ref 5.0–8.0)

## 2020-02-19 LAB — URIC ACID: Uric Acid, Serum: 2.2 mg/dL — ABNORMAL LOW (ref 2.5–7.1)

## 2020-02-19 LAB — MAGNESIUM: Magnesium: 1.6 mg/dL — ABNORMAL LOW (ref 1.7–2.4)

## 2020-02-19 MED ORDER — IOHEXOL 350 MG/ML SOLN
50.0000 mL | Freq: Once | INTRAVENOUS | Status: AC | PRN
  Administered 2020-02-19: 50 mL via INTRAVENOUS

## 2020-02-19 MED ORDER — POTASSIUM CHLORIDE CRYS ER 20 MEQ PO TBCR: 20.0000 meq | EXTENDED_RELEASE_TABLET | Freq: Two times a day (BID) | ORAL | Status: DC

## 2020-02-19 MED ORDER — SODIUM CHLORIDE 0.9 % IV SOLN
250.0000 mg | Freq: Three times a day (TID) | INTRAVENOUS | Status: DC
  Filled 2020-02-19 (×2): qty 2.5

## 2020-02-19 MED ORDER — POTASSIUM CHLORIDE 10 MEQ/100ML IV SOLN
10.0000 meq | INTRAVENOUS | Status: AC
  Administered 2020-02-19 (×2): 10 meq via INTRAVENOUS
  Filled 2020-02-19 (×2): qty 100

## 2020-02-19 MED ORDER — SODIUM CHLORIDE 0.9 % IV SOLN
500.0000 mg | Freq: Every day | INTRAVENOUS | Status: AC
  Administered 2020-02-19 – 2020-02-21 (×3): 500 mg via INTRAVENOUS
  Filled 2020-02-19 (×3): qty 4

## 2020-02-19 MED ORDER — METOCLOPRAMIDE HCL 5 MG/ML IJ SOLN
10.0000 mg | Freq: Once | INTRAMUSCULAR | Status: AC
  Administered 2020-02-19: 10 mg via INTRAVENOUS
  Filled 2020-02-19: qty 2

## 2020-02-19 MED ORDER — MAGNESIUM CHLORIDE 64 MG PO TBEC
1.0000 | DELAYED_RELEASE_TABLET | Freq: Two times a day (BID) | ORAL | Status: DC
  Filled 2020-02-19 (×2): qty 1

## 2020-02-19 MED ORDER — MAGNESIUM SULFATE 50 % IJ SOLN
3.0000 g | Freq: Once | INTRAVENOUS | Status: AC
  Administered 2020-02-19: 16:00:00 3 g via INTRAVENOUS
  Filled 2020-02-19: qty 6

## 2020-02-19 MED ORDER — SODIUM CHLORIDE 0.9 % IV SOLN: INTRAVENOUS | Status: DC

## 2020-02-19 MED ORDER — HYDROMORPHONE HCL 1 MG/ML IJ SOLN
1.0000 mg | Freq: Once | INTRAMUSCULAR | Status: AC
  Administered 2020-02-19: 12:00:00 0.5 mg via INTRAVENOUS
  Filled 2020-02-19: qty 1

## 2020-02-19 NOTE — Progress Notes (Signed)
STROKE TEAM PROGRESS NOTE   INTERVAL HISTORY Husband at the bedside. Patient  . She still complains of headache, bifrontal headache, seems to be worse than yesterday.  Quite more lethargic than yesterday. Marland Kitchen Headache has been present since admission.  Patient was started on Depakote ER and got 1 dose last night.  CT scan of the brain was done yesterday showed stable ventricular size without hydrocephalus and stable right occipital intraventricular hemorrhage patient was difficult to arouse this morning as per RN but when I arrived and examined her she was quite appropriate but kept her eyes closed and pointed to her head and said headache was quite severe.  I gave her Dilaudid 0.5 mg but she became even more sleepier after that and when aroused stated it did not help her headache.  She was also given 10 mg of Reglan which also did not help.  I started the patient on IV hydration with normal saline and noted that her hematocrit actually had gone up from 49 to 52.5.  I spoke to Dr. Alen Blew hematologist on call who suggested patient get 1 unit of phlebotomy today.  I also started the patient on IV hydration with normal saline.  CT scan of the head was reordered but inadvertently CT angiogram of the brain got done which shows no evidence of vasospasm with normal intracranial vasculature and the ventricle size appears unchanged  Vitals:   02/19/20 1307 02/19/20 1322 02/19/20 1328 02/19/20 1337  BP: (!) 160/73 (!) 161/85 (!) 162/75 (!) 164/72  Pulse:      Resp: (!) 25 (!) 28 (!) 27 (!) 28  Temp:      TempSrc:      SpO2: 95% 94% 94% 96%  Weight:      Height:       CBC:  Recent Labs  Lab 02/18/20 0332 02/19/20 0428  WBC 13.9* 20.8*  NEUTROABS 10.7* 17.7*  HGB 16.1* 17.3*  HCT 49.4* 52.6*  MCV 90.0 91.3  PLT 721* 0000000*   Basic Metabolic Panel:  Recent Labs  Lab 02/16/20 1938 02/16/20 1938 02/18/20 1147 02/19/20 0428  NA 141   < > 140 140  K 3.5   < > 2.7* 3.8  CL 111   < > 106 108  CO2 22    < > 24 21*  GLUCOSE 129*   < > 132* 133*  BUN 13   < > 8 8  CREATININE 0.80   < > 0.71 0.86  CALCIUM 9.0   < > 9.0 9.0  MG  --   --   --  1.6*  PHOS 2.3*  --   --   --    < > = values in this interval not displayed.   Lipid Panel:     Component Value Date/Time   CHOL 172 02/14/2020 0959   TRIG 46 02/14/2020 0959   HDL 71 02/14/2020 0959   CHOLHDL 2.4 02/14/2020 0959   VLDL 9 02/14/2020 0959   LDLCALC 92 02/14/2020 0959   HgbA1c:  Lab Results  Component Value Date   HGBA1C 6.1 (H) 02/14/2020    IMAGING past 24 hours  CT ANGIO HEAD W OR WO CONTRAST  Result Date: 02/19/2020 CLINICAL DATA:  Intraventricular hemorrhage EXAM: CT ANGIOGRAPHY HEAD TECHNIQUE: Multidetector CT imaging of the head was performed using the standard protocol during bolus administration of intravenous contrast. Multiplanar CT image reconstructions and MIPs were obtained to evaluate the vascular anatomy. CONTRAST:  81mL OMNIPAQUE IOHEXOL 350 MG/ML SOLN COMPARISON:  None. FINDINGS: Artifact is present. CTA HEAD Anterior circulation: Intracranial internal carotid arteries are patent with mild calcified plaque. Anterior and middle cerebral arteries are patent. Posterior circulation: Intracranial vertebral arteries, basilar artery, and posterior cerebral arteries are patent. Venous sinuses: Not opacified. IMPRESSION: Suboptimal evaluation due to artifact. Patent anterior and posterior circulations. No abnormal vascularity identified. Electronically Signed   By: Macy Mis M.D.   On: 02/19/2020 13:09   CT HEAD WO CONTRAST  Result Date: 02/18/2020 CLINICAL DATA:  74 year old female who presented with right side intraventricular hemorrhage on 02/14/2020. History of polycythemia. Subsequent encounter. EXAM: CT HEAD WITHOUT CONTRAST TECHNIQUE: Contiguous axial images were obtained from the base of the skull through the vertex without intravenous contrast. COMPARISON:  Head CT 02/16/2020 and earlier. FINDINGS: Brain:  Continued stable appearance of right lateral intraventricular hemorrhage. Since 02/16/2020 a small volume of 4th ventricular blood has cleared. No significant 3rd ventricular blood is evident. Layering blood in the left occipital horn is stable. Stable ventricle size and configuration. No definite transependymal edema. No parenchymal extension of blood or new intracranial hemorrhage identified. Confluent bilateral cerebral white matter hypodensity is stable. No midline shift. Basilar cisterns remain patent. No cortically based acute infarct identified. Vascular: Calcified atherosclerosis at the skull base. Conspicuous increased vascular density probably reflects known polycythemia. Skull: Stable, negative. Sinuses/Orbits: Stable sinus aeration. Other: Visualized orbits and scalp soft tissues are within normal limits. IMPRESSION: 1. Unchanged size and configuration of right lateral intraventricular hemorrhage since 02/16/2020. Small volume of intraventricular hemorrhage elsewhere has regressed. No ventriculomegaly or transependymal edema. 2. No new intracranial abnormality. 3. Advanced white matter disease, and numerous chronic microhemorrhages throughout the brain demonstrated on recent MRI. Electronically Signed   By: Genevie Ann M.D.   On: 02/18/2020 20:11    PHYSICAL EXAM     Temp:  [98 F (36.7 C)-99.1 F (37.3 C)] 98.8 F (37.1 C) (04/10 0755) Pulse Rate:  [70-97] 72 (04/10 1102) Resp:  [14-28] 28 (04/10 1337) BP: (119-166)/(56-105) 164/72 (04/10 1337) SpO2:  [94 %-100 %] 96 % (04/10 1337)  General - Well nourished, well developed, quite lethargic due to headache  Ophthalmologic - fundi not visualized due to noncooperation.  Cardiovascular - Regular rhythm and rate.  Mental Status -  Significantly lethargic due to headache, but awakens, and follows commands appropriately.  At times speech is difficult to understand and calm.  Language including expression, naming, repetition, comprehension was  assessed and found intact.  Cranial Nerves II - XII - II - Visual field intact OU. III, IV, VI - Extraocular movements intact. V - Facial sensation intact bilaterally. VII - mild left facial droop. VIII - Hearing & vestibular intact bilaterally. X - Palate elevates symmetrically. XI - Chin turning & shoulder shrug intact bilaterally. XII - Tongue protrusion intact.  Motor Strength - The patient's strength was normal in all extremities and pronator drift was absent.  Bulk was normal and fasciculations were absent.   Motor Tone - Muscle tone was assessed at the neck and appendages and was normal.  Reflexes - The patient's reflexes were symmetrical in all extremities and she had no pathological reflexes.  Sensory - Light touch, temperature/pinprick were assessed and were symmetrical.    Coordination - The patient had normal movements in the hands with no ataxia or dysmetria.  Tremor was absent.  Gait and Station - deferred.   ASSESSMENT/PLAN [name] is a 74 y.o. female with history of HTN presenting with severe persistent HA accompanied by  nausea and vomiting.   IVH - right lateral ventricle IVH secondary to ? CAA vs. HTN Persistent severe headache since admission for the last 5 days with increased lethargy last 2 days.  Repeat brain imaging does not show significant hydrocephalus or increasing hemorrhage  CT head 4/5 0032 R lateral IVH. Severe chronic ischemic microangiopathy.  CT head 4/5 0857 no sign change  MRI Stable IVH, severe leukoaraiosis. Multiple foci throughout progressed from previous MRI d/t microangiopathy vs amyloid.  MRA unremarkable   Repeat CT head 4/7 stable IVH and ventricle size  Repeat CT head 4/9 - Unchanged size and configuration of right lateral intraventricular hemorrhage since 02/16/2020. Small volume of intraventricular hemorrhage elsewhere has regressed. No ventriculomegaly or transependymal edema. No new intracranial abnormality.  Advanced white matter disease, and numerous chronic microhemorrhages throughout the brain demonstrated on recent MRI.  CTA Head - 02/19/2020 - Suboptimal evaluation due to artifact. Patent anterior and posterior circulations. No abnormal vascularity identified.  EEG - Pending  2D Echo EF 65-70%. 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 Ron Agee next week  IV fluids today  Hypertension  Home meds:  losartan 100, amlodipine 5  On losartan 50 bid and amlodipine 10  Add hydralazine 25 tid . SBP goal < 140  Now Off Cleviprex  BP Stable . 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 Multiple foci throughout progressed from previous MRI d/t microangiopathy vs amyloid.  As per husband, pt has some anomia as baseline  BP goal < 140  Avoid antiplatelet or anticoagulation  Hyperlipidemia  Home meds:  lipitor 10  LDL 92  Statin held in setting of  acute ICH  Consider continuation of statin at discharge  Hypokalemia  Potassium 3.3->3.5->2.7->3.8  Magnesium - 1.6  Supplement  Repeat potassium and check magnesium tomorrow  Other Stroke Risk Factors  Advanced age  Former Cigarette smoker  Overweight, Body mass index is 26.73 kg/m., recommend weight loss, diet and exercise as appropriate   Family hx stroke (mother, father, and multiple other family members)  Other Active Problems  Hepatitis C  Hypothyroid on synthroid. TSH WNL - resume synthroid   Labs in AM  Hospital day # 5 Patient continues to have severe headache since admission 5 days ago which appear not to have responded to Fioricet, Depakote.,  Dilaudid.  Plan aggressive hydration, start Solu-Medrol 500 mg daily for 3 days.  Check EEG for seizure activity.  Discussed  with hematologist on-call we will plan on 1 unit of phlebotomy today given her elevated hematocrit as there may be a component of hyperviscosity to her headaches. I had long discussion with husband [name] at bedside, updated pt current condition, treatment plan and potential prognosis, and answered all the questions. He expressed understanding and appreciation.  Greater than 50% time during this 35-minute visit was spent on counseling and coordination of care and discussion with care team Antony Contras, MD   To contact Stroke Continuity provider, please refer to [email or web address] After hours, contact General Neurology

## 2020-02-19 NOTE — Progress Notes (Signed)
Per Dr. Alen Blew remove 432ml of blood for therapeutic phlebotomy.

## 2020-02-19 NOTE — Procedures (Signed)
Patient Name: [name]  MRN: [id number]  Epilepsy Attending: Lora Havens  Referring Physician/Provider: Dr Antony Contras Date: 02/19/2020 Duration: 25.15 mins  Patient history: 74yo F with right IVH. EEG to evaluate for seizure.   Level of alertness: lethargic  AEDs during EEG study: None  Technical aspects: This EEG study was done with scalp electrodes positioned according to the 10-20 International system of electrode placement. Electrical activity was acquired at a sampling rate of 500Hz  and reviewed with a high frequency filter of 70Hz  and a low frequency filter of 1Hz . EEG data were recorded continuously and digitally stored.   DESCRIPTION: EEG showed continuous generalized 3-6Hz  theta-delta slowing. Triphasic waves, generalized, maximal bifrontal were also noted. Hyperventilation and photic stimulation were not performed.  ABNORMALITY - Continuous slow, generalized  - Triphasics waves, generalized   IMPRESSION: This study is suggestive of moderate diffuse encephalopathy, non specific to etiology but could be secondary to toxic-metabolic causes. No seizures or epileptiform discharges were seen throughout the recording.  Lavaughn Haberle Barbra Sarks

## 2020-02-19 NOTE — Progress Notes (Signed)
Paged Victoria Man MD regarding pt change of neuro status. Pt very lethargic and barely arousable.  MD ordered stat head CT.  Will reassess after.  Vitals stable. RN will continue to monitor.

## 2020-02-19 NOTE — Progress Notes (Signed)
EEG complete - results pending 

## 2020-02-19 NOTE — Progress Notes (Signed)
On-call cross coverage note neuro hospitalist  Subjective: Called by RN for change of NIH greater than 4.  Patient more drowsy not following commands. I recommended a stat CT head and saw and evaluated the patient in the CT scanner. Patient continues to complain of headaches   Objective: Patient is drowsy and in some distress due to headache, awakens to voice, follows all commands.  Her speech is not dysarthric but just slow.  No obvious aphasia.  Mild left nasolabial fold flattening, normal strength in all extremities, normal sensation.  No obvious dysmetria.  CT head-personally reviewed: Stable IVH since last scan this afternoon.  Assessment 74 year old history of hypertension presenting with severe persistent headache with nausea and vomiting noted to have a right lateral ventricular IVH secondary to hypertension versus CAA.  Headache has been persistent and severe.  Also is polycythemic, for which she had phlebotomy done today upon discussion with hematology. Continues to have persistent headaches and vaccine draining level of consciousness and cooperation with exam. I do not think she has frank worsening of her neurological status but is rather exhibiting waxing and waning symptoms as have been documented before.  Plan: Continue current treatment as recommended by the stroke team. Consider reducing sedating medications as they might contribute to the altered level or lower level of consciousness off and on. This is especially difficult given that she also has disabling headaches. Stroke neurology will continue to follow  Discussed with patient's RN at bedside.  -- Amie Portland, MD [facility] Pager: [phone]

## 2020-02-19 NOTE — Progress Notes (Signed)
I was asked by Dr. Leonie Man to comment on [name]'s labs. Here hgb is up to 17.3 and platelets are down to 654.  She is complaining of increased headaches without neurological signs of symptoms.    I recommend repeating phlebotomy for one unit today. Repeat imaging of the head/brain will be up to primary team.

## 2020-02-19 NOTE — Progress Notes (Signed)
Removed 400 ml blood via 18G PIV in right AC. Patient tolerated procedure well. VSS.

## 2020-02-19 NOTE — Progress Notes (Signed)
PT Cancellation Note  Patient Details Name: [name] MRN: [id number] DOB: March 02, 1946   Cancelled Treatment:    Reason Eval/Treat Not Completed: (P) Medical issues which prohibited therapy(Rn reports medical decline will defer PT session at this time.)   Cristela Blue 02/19/2020, 12:28 PM Erasmo Leventhal , PTA Acute Rehabilitation Services Pager [phone] Office [phone]

## 2020-02-20 DIAGNOSIS — I615 Nontraumatic intracerebral hemorrhage, intraventricular: Secondary | ICD-10-CM | POA: Diagnosis not present

## 2020-02-20 LAB — COMPREHENSIVE METABOLIC PANEL
ALT: 12 U/L (ref 0–44)
AST: 12 U/L — ABNORMAL LOW (ref 15–41)
Albumin: 2.9 g/dL — ABNORMAL LOW (ref 3.5–5.0)
Alkaline Phosphatase: 34 U/L — ABNORMAL LOW (ref 38–126)
Anion gap: 8 (ref 5–15)
BUN: 11 mg/dL (ref 8–23)
CO2: 21 mmol/L — ABNORMAL LOW (ref 22–32)
Calcium: 8.3 mg/dL — ABNORMAL LOW (ref 8.9–10.3)
Chloride: 109 mmol/L (ref 98–111)
Creatinine, Ser: 0.81 mg/dL (ref 0.44–1.00)
GFR calc Af Amer: >60 mL/min (ref >60)
GFR calc non Af Amer: >60 mL/min (ref >60)
Glucose, Bld: 146 mg/dL — ABNORMAL HIGH (ref 70–99)
Potassium: 3.4 mmol/L — ABNORMAL LOW (ref 3.5–5.1)
Sodium: 138 mmol/L (ref 135–145)
Total Bilirubin: 0.8 mg/dL (ref 0.3–1.2)
Total Protein: 4.5 g/dL — ABNORMAL LOW (ref 6.5–8.1)

## 2020-02-20 LAB — CBC WITH DIFFERENTIAL/PLATELET
Abs Immature Granulocytes: 0.23 10*3/uL — ABNORMAL HIGH (ref 0.00–0.07)
Basophils Absolute: 0.1 10*3/uL (ref 0.0–0.1)
Basophils Relative: 0 %
Eosinophils Absolute: 0 10*3/uL (ref 0.0–0.5)
Eosinophils Relative: 0 %
HCT: 39.5 % (ref 36.0–46.0)
Hemoglobin: 13.3 g/dL (ref 12.0–15.0)
Immature Granulocytes: 1 %
Lymphocytes Relative: 5 %
Lymphs Abs: 0.9 10*3/uL (ref 0.7–4.0)
MCH: 30.5 pg (ref 26.0–34.0)
MCHC: 33.7 g/dL (ref 30.0–36.0)
MCV: 90.6 fL (ref 80.0–100.0)
Monocytes Absolute: 1.4 10*3/uL — ABNORMAL HIGH (ref 0.1–1.0)
Monocytes Relative: 8 %
Neutro Abs: 15.5 10*3/uL — ABNORMAL HIGH (ref 1.7–7.7)
Neutrophils Relative %: 86 %
Platelets: 548 10*3/uL — ABNORMAL HIGH (ref 150–400)
RBC: 4.36 MIL/uL (ref 3.87–5.11)
RDW: 14.3 % (ref 11.5–15.5)
WBC: 18 10*3/uL — ABNORMAL HIGH (ref 4.0–10.5)
nRBC: 0 % (ref 0.0–0.2)

## 2020-02-20 LAB — GLUCOSE, CAPILLARY: Glucose-Capillary: 146 mg/dL — ABNORMAL HIGH (ref 70–99)

## 2020-02-20 MED ORDER — MAGNESIUM SULFATE 50 % IJ SOLN
4.0000 g | Freq: Once | INTRAVENOUS | Status: AC
  Administered 2020-02-20: 4 g via INTRAVENOUS
  Filled 2020-02-20: qty 8

## 2020-02-20 MED ORDER — POTASSIUM CHLORIDE 10 MEQ/100ML IV SOLN
10.0000 meq | INTRAVENOUS | Status: AC
  Administered 2020-02-20 (×4): 10 meq via INTRAVENOUS
  Filled 2020-02-20 (×4): qty 100

## 2020-02-20 NOTE — Progress Notes (Signed)
STROKE TEAM PROGRESS NOTE   INTERVAL HISTORY Patient is lying in bed.  She is awake.  She feels headaches are much better but still present.  She is interactive.  Vital signs are stable.  Repeat hematocrit is much down to 39.5.  WBC count is down to 18.5 Repeat CT scan of the head last night showed stable off hemorrhage appearance without any increase in ventricular size or hydrocephalus Vitals:   02/20/20 0335 02/20/20 0713 02/20/20 0823 02/20/20 1204  BP: 116/62 (!) 134/59 (!) 119/100   Pulse: 72 78  85  Resp: 17 17 10    Temp: 98.8 F (37.1 C) 98.7 F (37.1 C) 99 F (37.2 C) 98.4 F (36.9 C)  TempSrc: Oral Oral Oral Oral  SpO2: 97% 97% 98%   Weight:      Height:       CBC:  Recent Labs  Lab 02/19/20 0428 02/20/20 0414  WBC 20.8* 18.0*  NEUTROABS 17.7* 15.5*  HGB 17.3* 13.3  HCT 52.6* 39.5  MCV 91.3 90.6  PLT 654* [id number]   Basic Metabolic Panel:  Recent Labs  Lab 02/16/20 1938 02/18/20 1147 02/19/20 0428 02/20/20 0414  NA 141   < > 140 138  K 3.5   < > 3.8 3.4*  CL 111   < > 108 109  CO2 22   < > 21* 21*  GLUCOSE 129*   < > 133* 146*  BUN 13   < > 8 11  CREATININE 0.80   < > 0.86 0.81  CALCIUM 9.0   < > 9.0 8.3*  MG  --   --  1.6*  --   PHOS 2.3*  --   --   --    < > = values in this interval not displayed.   Lipid Panel:     Component Value Date/Time   CHOL 172 02/14/2020 0959   TRIG 46 02/14/2020 0959   HDL 71 02/14/2020 0959   CHOLHDL 2.4 02/14/2020 0959   VLDL 9 02/14/2020 0959   LDLCALC 92 02/14/2020 0959   HgbA1c:  Lab Results  Component Value Date   HGBA1C 6.1 (H) 02/14/2020    IMAGING past 24 hours  CT ANGIO HEAD W OR WO CONTRAST 02/19/2020 IMPRESSION:  Suboptimal evaluation due to artifact. Patent anterior and posterior circulations. No abnormal vascularity identified.   CT HEAD WO CONTRAST 02/19/2020 IMPRESSION:  1. Stable since 02/18/2020. Unchanged volume of IVH primarily in the right lateral ventricle. No intracranial mass effect.   2. Severe cerebral white matter disease. Numerous chronic microhemorrhages throughout the brain also demonstrated recently by MRI.  EEG adult 02/19/2020 IMPRESSION:  This study is suggestive of moderate diffuse encephalopathy, non specific to etiology but could be secondary to toxic-metabolic causes. No seizures or epileptiform discharges were seen throughout the recording.    PHYSICAL EXAM     Temp:  [96.8 F (36 C)-99.5 F (37.5 C)] 98.4 F (36.9 C) (04/11 1204) Pulse Rate:  [72-106] 85 (04/11 1204) Resp:  [10-28] 10 (04/11 0823) BP: (116-166)/(49-105) 119/100 (04/11 0823) SpO2:  [93 %-98 %] 98 % (04/11 0823)  General - Well nourished, well developed, pleasant elderly Caucasian lady Ophthalmologic - fundi not visualized due to noncooperation.  Cardiovascular - Regular rhythm and rate.  Mental Status -  She is awake and alert and follows commands appropriately.  At times speech is difficult to understand and calm.  Language including expression, naming, repetition, comprehension was assessed and found intact.  Cranial Nerves II -  XII - II - Visual field intact OU. III, IV, VI - Extraocular movements intact. V - Facial sensation intact bilaterally. VII - mild left facial droop. VIII - Hearing & vestibular intact bilaterally. X - Palate elevates symmetrically. XI - Chin turning & shoulder shrug intact bilaterally. XII - Tongue protrusion intact.  Motor Strength - The patient's strength was normal in all extremities and pronator drift was absent.  Bulk was normal and fasciculations were absent.   Motor Tone - Muscle tone was assessed at the neck and appendages and was normal.  Reflexes - The patient's reflexes were symmetrical in all extremities and she had no pathological reflexes.  Sensory - Light touch, temperature/pinprick were assessed and were symmetrical.    Coordination - The patient had normal movements in the hands with no ataxia or dysmetria.  Tremor was  absent.  Gait and Station - deferred.   ASSESSMENT/PLAN Ms. ADDIELYNN KLEINBERG is a 74 y.o. female with history of HTN presenting with severe persistent HA accompanied by nausea and vomiting.   IVH - right lateral ventricle IVH secondary to ? CAA vs. HTN Persistent severe headache since admission for the last 5 days with increased lethargy last 2 days.  Repeat brain imaging does not show significant hydrocephalus or increasing hemorrhage  CT head 4/5 0032 R lateral IVH. Severe chronic ischemic microangiopathy.  CT head 4/5 0857 no sign change  MRI Stable IVH, severe leukoaraiosis. Multiple foci throughout progressed from previous MRI d/t microangiopathy vs amyloid.  MRA unremarkable   Repeat CT head 4/7 stable IVH and ventricle size  Repeat CT head 4/9 - Unchanged size and configuration of right lateral intraventricular hemorrhage since 02/16/2020. Small volume of intraventricular hemorrhage elsewhere has regressed. No ventriculomegaly or transependymal edema. No new intracranial abnormality. Advanced white matter disease, and numerous chronic microhemorrhages throughout the brain demonstrated on recent MRI.  CTA Head - 02/19/2020 - Suboptimal evaluation due to artifact. Patent anterior and posterior circulations. No abnormal vascularity identified.  EEG - This study is suggestive of moderate diffuse encephalopathy, non specific to etiology but could be secondary to toxic-metabolic causes. No seizures or epileptiform discharges were seen throughout the recording.   2D Echo EF 65-70%. No source of embolus   LDL 92   UA - unremarkable except ketones 20 (not diabetic)  HgbA1c 6.1   SCDs for VTE prophylaxis  aspirin 81 mg daily prior to admission, now on No antithrombotic given IVH and likely CAA  Therapy recommendations:  HH PT, HH OT, HH SLP (arranged)  Disposition:  anticipate d/c tomorrow   Headache, due to IVH  Depakote 500 mg nightly discontinued  Pt now has Fioricet one  Q8 hrs prn as well as tylenol prn  Solumedrol 500 mg IV daily for 3 days started Saturday  Dilaudid - one time dose 02/19/20 per Dr Leonie Man  The pt was seen by Dr Rory Percy last night (02/19/20) around 8:30 PM for ongoing HA pain. Other than being drowsy the pt had no neurologic findings.  NS at 100 cc's / hr started 4/10  Therapeutic phlebotomy performed 4/10  Pancytosis due to polycythemia vera   WBC 12->24.4->17.9->17.8->13.9->20.8 (temp - 98.8)->18.0  Hgb 17.1->18.6->16.2->16.0->15.4->16.1->17.3->13.3  PLT 990->738->691->669->721->654->548  Dr. 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  Magrinant next week  IV fluids NS at 100 cc's per hr.  Dr Leonie Man discussed with Dr Alen Blew on Saturday -> 1 unit of phlebotomy.  Hypertension  Home meds:  losartan 100, amlodipine 5  On losartan 50 bid and amlodipine 10  Add hydralazine 25 tid . SBP goal < 140  Now Off Cleviprex  BP Stable (SBP 116 - 134 Sunday) . 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 Multiple foci throughout progressed from previous MRI d/t microangiopathy vs amyloid.  As per husband, pt has some anomia as baseline  BP goal < 140  Avoid antiplatelet or anticoagulation  Hyperlipidemia  Home meds:  lipitor 10  LDL 92  Statin held in setting of acute ICH  Consider continuation of statin at discharge  Hypokalemia and Hypomagnesemia   Potassium 3.3->3.5->2.7->3.8->3.4 supplement and recheck in AM  Magnesium - 1.6 supplement and recheck in AM  Magnesium may help HA pain  Other Stroke Risk  Factors  Advanced age  Former Cigarette smoker  Overweight, Body mass index is 26.73 kg/m., recommend weight loss, diet and exercise as appropriate   Family hx stroke (mother, father, and multiple other family members)  Other Active Problems  Hepatitis C  Hypothyroid on synthroid. TSH WNL - resume synthroid   Labs in AM  Hospital day # 6 Patient's headaches seem to be somewhat improved.  Continue Solu-Medrol daily for 3 days and IV hydration.  Continue Depakote ER 500 mg daily as well.  Hopefully discharge in next few days if headaches better discussed with patient and RN and answered questions.  Greater than 50% time during this 25-minute visit was spent on counseling and coordination of care and discussion with care team Adrina Armijo,MD   To contact Stroke Continuity provider, please refer to [email or web address] After hours, contact General Neurology

## 2020-02-20 NOTE — Progress Notes (Signed)
Yellow MEWS protocol completed

## 2020-02-21 DIAGNOSIS — I615 Nontraumatic intracerebral hemorrhage, intraventricular: Secondary | ICD-10-CM | POA: Diagnosis not present

## 2020-02-21 LAB — CBC WITH DIFFERENTIAL/PLATELET
Abs Immature Granulocytes: 0.18 10*3/uL — ABNORMAL HIGH (ref 0.00–0.07)
Basophils Absolute: 0 10*3/uL (ref 0.0–0.1)
Basophils Relative: 0 %
Eosinophils Absolute: 0 10*3/uL (ref 0.0–0.5)
Eosinophils Relative: 0 %
HCT: 38.4 % (ref 36.0–46.0)
Hemoglobin: 12.9 g/dL (ref 12.0–15.0)
Immature Granulocytes: 1 %
Lymphocytes Relative: 5 %
Lymphs Abs: 1 10*3/uL (ref 0.7–4.0)
MCH: 30.3 pg (ref 26.0–34.0)
MCHC: 33.6 g/dL (ref 30.0–36.0)
MCV: 90.1 fL (ref 80.0–100.0)
Monocytes Absolute: 1.1 10*3/uL — ABNORMAL HIGH (ref 0.1–1.0)
Monocytes Relative: 5 %
Neutro Abs: 17.3 10*3/uL — ABNORMAL HIGH (ref 1.7–7.7)
Neutrophils Relative %: 89 %
Platelets: 324 10*3/uL (ref 150–400)
RBC: 4.26 MIL/uL (ref 3.87–5.11)
RDW: 14.7 % (ref 11.5–15.5)
WBC: 19.5 10*3/uL — ABNORMAL HIGH (ref 4.0–10.5)
nRBC: 0 % (ref 0.0–0.2)

## 2020-02-21 LAB — BASIC METABOLIC PANEL
Anion gap: 7 (ref 5–15)
BUN: 10 mg/dL (ref 8–23)
CO2: 20 mmol/L — ABNORMAL LOW (ref 22–32)
Calcium: 8.2 mg/dL — ABNORMAL LOW (ref 8.9–10.3)
Chloride: 113 mmol/L — ABNORMAL HIGH (ref 98–111)
Creatinine, Ser: 0.72 mg/dL (ref 0.44–1.00)
GFR calc Af Amer: >60 mL/min (ref >60)
GFR calc non Af Amer: >60 mL/min (ref >60)
Glucose, Bld: 139 mg/dL — ABNORMAL HIGH (ref 70–99)
Potassium: 3.3 mmol/L — ABNORMAL LOW (ref 3.5–5.1)
Sodium: 140 mmol/L (ref 135–145)

## 2020-02-21 LAB — MAGNESIUM: Magnesium: 2.2 mg/dL (ref 1.7–2.4)

## 2020-02-21 MED ORDER — PREDNISONE 20 MG PO TABS
40.0000 mg | ORAL_TABLET | Freq: Every day | ORAL | Status: AC
  Administered 2020-02-22: 40 mg via ORAL
  Filled 2020-02-21: qty 2

## 2020-02-21 MED ORDER — POTASSIUM CHLORIDE CRYS ER 20 MEQ PO TBCR
40.0000 meq | EXTENDED_RELEASE_TABLET | ORAL | Status: AC
  Administered 2020-02-21 (×2): 40 meq via ORAL
  Filled 2020-02-21 (×2): qty 2

## 2020-02-21 MED ORDER — PREDNISONE 5 MG PO TABS
30.0000 mg | ORAL_TABLET | Freq: Every day | ORAL | Status: AC
  Administered 2020-02-23: 30 mg via ORAL
  Filled 2020-02-21: qty 1

## 2020-02-21 MED ORDER — TOPIRAMATE 25 MG PO TABS
25.0000 mg | ORAL_TABLET | Freq: Two times a day (BID) | ORAL | Status: DC
  Administered 2020-02-21 – 2020-02-24 (×7): 25 mg via ORAL
  Filled 2020-02-21 (×7): qty 1

## 2020-02-21 NOTE — Progress Notes (Signed)
STROKE TEAM PROGRESS NOTE   INTERVAL HISTORY Patient is sitting sitting up in bed.  She states headache is better but still bothersome.  She still needs some assistance to get up and walk and she and her husband not comfortable going home today.  Vitals:   02/21/20 0421 02/21/20 0844 02/21/20 0909 02/21/20 1148  BP:  (!) 148/65  (!) 150/68  Pulse:  84  94  Resp: 20 (!) 22 18 17   Temp:  98 F (36.7 C)  98.6 F (37 C)  TempSrc:  Oral  Oral  SpO2: 97% 96%  97%  Weight:      Height:       CBC:  Recent Labs  Lab 02/20/20 0414 02/21/20 0403  WBC 18.0* 19.5*  NEUTROABS 15.5* 17.3*  HGB 13.3 12.9  HCT 39.5 38.4  MCV 90.6 90.1  PLT 548* 0000000   Basic Metabolic Panel:  Recent Labs  Lab 02/16/20 1938 02/18/20 1147 02/19/20 0428 02/19/20 0428 02/20/20 0414 02/21/20 0403  NA 141   < > 140   < > 138 140  K 3.5   < > 3.8   < > 3.4* 3.3*  CL 111   < > 108   < > 109 113*  CO2 22   < > 21*   < > 21* 20*  GLUCOSE 129*   < > 133*   < > 146* 139*  BUN 13   < > 8   < > 11 10  CREATININE 0.80   < > 0.86   < > 0.81 0.72  CALCIUM 9.0   < > 9.0   < > 8.3* 8.2*  MG  --   --  1.6*  --   --  2.2  PHOS 2.3*  --   --   --   --   --    < > = values in this interval not displayed.   Lipid Panel:     Component Value Date/Time   CHOL 172 02/14/2020 0959   TRIG 46 02/14/2020 0959   HDL 71 02/14/2020 0959   CHOLHDL 2.4 02/14/2020 0959   VLDL 9 02/14/2020 0959   LDLCALC 92 02/14/2020 0959   HgbA1c:  Lab Results  Component Value Date   HGBA1C 6.1 (H) 02/14/2020    IMAGING past 24 hours No results found.    PHYSICAL EXAM    Temp:  [98 F (36.7 C)-99.8 F (37.7 C)] 98.6 F (37 C) (04/12 1148) Pulse Rate:  [72-94] 94 (04/12 1148) Resp:  [11-22] 17 (04/12 1148) BP: (120-158)/(52-70) 150/68 (04/12 1148) SpO2:  [96 %-99 %] 97 % (04/12 1148)  General - Well nourished, well developed, pleasant elderly Caucasian lady Ophthalmologic - fundi not visualized due to  noncooperation.  Cardiovascular - Regular rhythm and rate.  Mental Status -  She is awake and alert and follows commands appropriately.  At times speech is difficult to understand and calm.  Language including expression, naming, repetition, comprehension was assessed and found intact.  Cranial Nerves II - XII - II - Visual field intact OU. III, IV, VI - Extraocular movements intact. V - Facial sensation intact bilaterally. VII - mild left facial droop. VIII - Hearing & vestibular intact bilaterally. X - Palate elevates symmetrically. XI - Chin turning & shoulder shrug intact bilaterally. XII - Tongue protrusion intact.  Motor Strength - The patient's strength was normal in all extremities and pronator drift was absent.  Bulk was normal and fasciculations were absent.  Motor Tone - Muscle tone was assessed at the neck and appendages and was normal.  Reflexes - The patient's reflexes were symmetrical in all extremities and she had no pathological reflexes.  Sensory - Light touch, temperature/pinprick were assessed and were symmetrical.    Coordination - The patient had normal movements in the hands with no ataxia or dysmetria.  Tremor was absent.  Gait and Station - deferred.   ASSESSMENT/PLAN [name] is a 74 y.o. female with history of HTN presenting with severe persistent HA accompanied by nausea and vomiting.   IVH - right lateral ventricle IVH secondary to ? CAA vs. HTN Persistent severe headache since admission for the last 5 days with increased lethargy last 2 days.  Repeat brain imaging does not show significant hydrocephalus or increasing hemorrhage  CT head 4/5 0032 R lateral IVH. Severe chronic ischemic microangiopathy.  CT head 4/5 0857 no sign change  MRI Stable IVH, severe leukoaraiosis. Multiple foci throughout progressed from previous MRI d/t microangiopathy vs amyloid.  MRA unremarkable   Repeat CT head 4/7 stable IVH and ventricle  size  Repeat CT head 4/9 - Unchanged size and configuration of right lateral intraventricular hemorrhage since 02/16/2020. Small volume of intraventricular hemorrhage elsewhere has regressed. No ventriculomegaly or transependymal edema. No new intracranial abnormality. Advanced white matter disease, and numerous chronic microhemorrhages throughout the brain demonstrated on recent MRI.  CTA Head - 02/19/2020 - Suboptimal evaluation due to artifact. Patent anterior and posterior circulations. No abnormal vascularity identified.  EEG - This study is suggestive of moderate diffuse encephalopathy, non specific to etiology but could be secondary to toxic-metabolic causes. No seizures or epileptiform discharges were seen throughout the recording.   2D Echo EF 65-70%. No source of embolus   LDL 92   UA - unremarkable except ketones 20 (not diabetic)  HgbA1c 6.1   SCDs for VTE prophylaxis  aspirin 81 mg daily prior to admission, now on No antithrombotic given IVH and likely CAA  Therapy recommendations:  HH PT, HH OT, HH SLP (arranged)  Disposition:  Hold d/c given ongoing HA  Headache, due to IVH  Depakote 500 mg nightly discontinued  Pt now has Fioricet one Q8 hrs prn as well as tylenol prn  Solumedrol 500 mg IV daily for 3 days started Saturday  Dilaudid - one time dose 02/19/20 per Dr Leonie Man  NS at 100 cc's / hr started 4/10  Therapeutic phlebotomy performed 4/10  Add topamax 25 bid  Start prednisone taper 4/13 - 40-30-20-10-5-5 (ordered 40 and 30 - will need to order other future doses)  Pancytosis due to polycythemia vera   WBC 12->24.4->17.9->17.8->13.9->20.8->18.0->19.5  Hgb 17.1->18.6->16.2->16.0->15.4->16.1->17.3->13.3->12.9  PLT 990->738->691->669->721->654->548->324  Dr. Ron Agee on board   Bone marrow bx done, results no leukemia, consistent with polycythemia vera on hydrea  S/p Phlebotomy and pheresis x 3  Erythropoietin level 1.3 (L)  JAK2 positive for  V617F mutation  D/c CL today after pheresis  Further workup as an OP with Dr. Ron Agee   IV fluids NS at 100 cc's per hr.  Dr Leonie Man discussed with Dr Alen Blew on Saturday -> 1 unit of phlebotomy 4/10  Hypertension  Home meds:  losartan 100, amlodipine 5  On losartan 50 bid and amlodipine 10  Add hydralazine 25 tid . SBP goal < 140  Off Cleviprex  BP Stable  . Long-term BP goal normotensive  Likely CAA  2015 MRI showed numerous MCBs throughout the brain as well as severe confluent leukoaraiosis  MRI 02/14/20 Extensive confluent T2 hyperintensity c/w small vessel disease. Multiple foci throughout progressed from previous MRI d/t microangiopathy vs amyloid.  As per husband, pt has some anomia as baseline  BP goal < 140  Avoid antiplatelet or anticoagulation  Hyperlipidemia  Home meds:  lipitor 10  LDL 92  Statin held in setting of acute ICH  Consider continuation of statin at discharge  Hypokalemia and Hypomagnesemia   Potassium 3.3->3.5->2.7->3.8->3.4->3.3 supplement and recheck in AM  Magnesium - 1.6 supplement - 2.2  Magnesium may help HA pain  Other Stroke Risk Factors  Advanced age  Former Cigarette smoker  Overweight, Body mass index is 26.73 kg/m., recommend weight loss, diet and exercise as appropriate   Family hx stroke (mother, father, and multiple other family members)  Other Active Problems  Hepatitis C  Hypothyroid on synthroid. TSH WNL - resume synthroid   Hospital day # 7 Patient has obtained some improvement with IV Solu-Medrol and will finish day 3 today.  We will start oral prednisone taper for the next 1 week and also add Topamax 25 twice daily.  Long discussion of the bedside with the patient and husband and answered questions.  Greater than 50% time during this 25-minute visit was spent in counseling and coordination of care and discussion of her intracerebral hemorrhage and headache and answering questions  Joylyn Duggin,MD   To contact Stroke Continuity provider, please refer to [email or web address] After hours, contact General Neurology

## 2020-02-21 NOTE — Progress Notes (Signed)
Occupational Therapy Treatment Patient Details Name: REBECA RATKOWSKI MRN: [id number] DOB: 09/15/1946 Today's Date: 02/21/2020    History of present illness 74 yo female presented with persistent HA, nausea and vomiting. pt with stable R latearl ventricle IVH PMH HTN   OT comments  Patient continues to make progress towards goals in skilled OT session. Patient's session encompassed ADLs and functional mobility to complete household tasks. Pt remains limited in activity tolerance and further completion of ADLs due to headache, requiring increased time in positional changes due to pain. Provided education to son in regards to safety upon transition home as HHOT continues to remain appropriate at this time. Will continue to follow acutely.    Follow Up Recommendations  Home health OT    Equipment Recommendations  3 in 1 bedside commode    Recommendations for Other Services      Precautions / Restrictions Precautions Precautions: Fall Restrictions Weight Bearing Restrictions: No       Mobility Bed Mobility Overal bed mobility: Modified Independent Bed Mobility: Sit to Supine;Supine to Sit     Supine to sit: Supervision     General bed mobility comments: increased time and effort due to increased pain with postural changes  Transfers Overall transfer level: Needs assistance Equipment used: None Transfers: Sit to/from Stand Sit to Stand: Min guard;Min assist         General transfer comment: min guard for safety; no assist needed to power up; min guard in standing, min A at sink due to one LOB in positional changes    Balance Overall balance assessment: Needs assistance Sitting-balance support: Feet supported Sitting balance-Leahy Scale: Good     Standing balance support: During functional activity Standing balance-Leahy Scale: Fair Standing balance comment: one LOB balance noted at sink, remains limited in increased activity tolerance due to headache                            ADL either performed or assessed with clinical judgement   ADL Overall ADL's : Needs assistance/impaired     Grooming: Wash/dry face;Wash/dry hands;Standing;Minimal assistance Grooming Details (indicate cue type and reason): Pt with need for increased cues to locate items on L, and one LOB noted when transitioning from propped on elbows to wash face to upright posture             Lower Body Dressing: Sitting/lateral leans;Min guard Lower Body Dressing Details (indicate cue type and reason): Pt able to don socks in figure positiong seated EOB             Functional mobility during ADLs: Min guard;Minimal assistance General ADL Comments: Pt min gaurd/min A due to decreased balance in session, remains limited in increased participation due to headache     Vision Baseline Vision/History: Wears glasses Wears Glasses: At all times     Perception     Praxis      Cognition Arousal/Alertness: Awake/alert Behavior During Therapy: [facility] for tasks assessed/performed Overall Cognitive Status: Impaired/Different from baseline Area of Impairment: Memory;Attention                   Current Attention Level: Sustained Memory: Decreased short-term memory     Awareness: Emergent   General Comments: Pt required increased cues to locate items on L and self occluded one eye in initial standing, however admitted this was due to headache and not vision, pain continues to be limiting factor  Exercises     Shoulder Instructions       General Comments      Pertinent Vitals/ Pain       Pain Assessment: Faces Faces Pain Scale: Hurts whole lot Pain Location: Positional headache Pain Descriptors / Indicators: Headache Pain Intervention(s): Limited activity within patient's tolerance;Monitored during session;Repositioned;Ice applied  Home Living                                          Prior Functioning/Environment               Frequency  Min 2X/week        Progress Toward Goals  OT Goals(current goals can now be found in the care plan section)  Progress towards OT goals: Progressing toward goals  Acute Rehab OT Goals Patient Stated Goal: to go home OT Goal Formulation: With patient Time For Goal Achievement: 03/01/20 Potential to Achieve Goals: Good  Plan Discharge plan remains appropriate    Co-evaluation                 AM-PAC OT "6 Clicks" Daily Activity     Outcome Measure   Help from another person eating meals?: None Help from another person taking care of personal grooming?: None Help from another person toileting, which includes using toliet, bedpan, or urinal?: A Little Help from another person bathing (including washing, rinsing, drying)?: A Little Help from another person to put on and taking off regular upper body clothing?: A Little Help from another person to put on and taking off regular lower body clothing?: A Little 6 Click Score: 20    End of Session Equipment Utilized During Treatment: Gait belt  OT Visit Diagnosis: Unsteadiness on feet (R26.81);Muscle weakness (generalized) (M62.81)   Activity Tolerance Patient tolerated treatment well   Patient Left in chair;with call bell/phone within reach;with chair alarm set   Nurse Communication Mobility status;Precautions        Time: [id number] OT Time Calculation (min): 28 min  Charges: OT General Charges $OT Visit: 1 Visit OT Treatments $Self Care/Home Management : 23-37 mins  Cumby. Nathon Stefanski, COTA/L Acute Rehabilitation Services [place] 02/21/2020, 12:00 PM

## 2020-02-21 NOTE — Progress Notes (Signed)
Physical Therapy Treatment Patient Details Name: [name] MRN: [id number] DOB: 1946/01/23 Today's Date: 02/21/2020    History of Present Illness 74 yo female presented with persistent HA, nausea and vomiting. pt with stable R latearl ventricle IVH PMH HTN    PT Comments    Patient seen for mobility progression. Pt continues to have headache that worsens with positional changes. Pt reports throbbing with mobility. Pt tolerated increased gait distance and requires min guard/min A with use of RW. HR 90s-130s, A fib and increased WOB and wheezing with mobility this session. PT will continue to follow acutely and progress as tolerated.     Follow Up Recommendations  Home health PT;Supervision for mobility/OOB     Equipment Recommendations  None recommended by PT    Recommendations for Other Services       Precautions / Restrictions Precautions Precautions: Fall Restrictions Weight Bearing Restrictions: No    Mobility  Bed Mobility Overal bed mobility: Modified Independent Bed Mobility: Supine to Sit;Sit to Supine           General bed mobility comments: increased time and effort; use of rail  Transfers Overall transfer level: Needs assistance Equipment used: None Transfers: Sit to/from Stand Sit to Stand: Min guard         General transfer comment: min guard for safety; no assist needed to power up  Ambulation/Gait Ambulation/Gait assistance: Min guard;Min assist Gait Distance (Feet): 100 Feet Assistive device: Rolling walker (2 wheeled) Gait Pattern/deviations: Step-through pattern;Decreased stride length Gait velocity: decr   General Gait Details: grossly min guard with use of RW but min A needed at times due to impaired balance; cues for navigating environment and for safe use of AD; decreased cadence   Stairs             Wheelchair Mobility    Modified Rankin (Stroke Patients Only) Modified Rankin (Stroke Patients Only) Pre-Morbid  Rankin Score: No symptoms Modified Rankin: Moderately severe disability     Balance Overall balance assessment: Needs assistance Sitting-balance support: Feet supported Sitting balance-Leahy Scale: Good     Standing balance support: During functional activity;Single extremity supported;Bilateral upper extremity supported Standing balance-Leahy Scale: Poor                              Cognition Arousal/Alertness: Awake/alert Behavior During Therapy: WFL for tasks assessed/performed Overall Cognitive Status: Impaired/Different from baseline Area of Impairment: Memory;Attention                   Current Attention Level: Sustained Memory: Decreased short-term memory         General Comments: pt with come confusion and difficulty sequencing tasks; distracted by pain from headache       Exercises      General Comments General comments (skin integrity, edema, etc.): pt with increased WOB and wheezing with mobility; HR 90s-130s, A fib      Pertinent Vitals/Pain Pain Assessment: Faces Faces Pain Scale: Hurts whole lot Pain Location: headache (worsens with positional changes) Pain Descriptors / Indicators: Throbbing Pain Intervention(s): Limited activity within patient's tolerance;Monitored during session;Repositioned    Home Living                      Prior Function            PT Goals (current goals can now be found in the care plan section) Acute Rehab PT Goals Patient Stated Goal:  to go home Progress towards PT goals: Progressing toward goals    Frequency    Min 4X/week      PT Plan Current plan remains appropriate    Co-evaluation              AM-PAC PT "6 Clicks" Mobility   Outcome Measure  Help needed turning from your back to your side while in a flat bed without using bedrails?: A Little Help needed moving from lying on your back to sitting on the side of a flat bed without using bedrails?: A Little Help needed  moving to and from a bed to a chair (including a wheelchair)?: A Little Help needed standing up from a chair using your arms (e.g., wheelchair or bedside chair)?: A Little Help needed to walk in hospital room?: A Little Help needed climbing 3-5 steps with a railing? : A Lot 6 Click Score: 17    End of Session Equipment Utilized During Treatment: Gait belt Activity Tolerance: Patient limited by pain(headache worsens with mobility) Patient left: with call bell/phone within reach;in bed;with bed alarm set Nurse Communication: Mobility status PT Visit Diagnosis: Other abnormalities of gait and mobility (R26.89);Pain;Other symptoms and signs involving the nervous system (R29.898) Pain - part of body: (headache)     Time: 1530-1610 PT Time Calculation (min) (ACUTE ONLY): 40 min  Charges:  $Gait Training: 8-22 mins $Therapeutic Activity: 8-22 mins                     Earney Navy, PTA Acute Rehabilitation Services Pager: [phone] Office: [phone]     Darliss Cheney 02/21/2020, 6:11 PM

## 2020-02-21 NOTE — Plan of Care (Signed)
  Problem: Education: Goal: Knowledge of disease or condition will improve Outcome: Progressing Goal: Knowledge of secondary prevention will improve Outcome: Progressing Goal: Knowledge of patient specific risk factors addressed and post discharge goals established will improve Outcome: Progressing   Problem: Coping: Goal: Will identify appropriate support needs Outcome: Progressing   Problem: Education: Goal: Knowledge of disease or condition will improve Outcome: Progressing Goal: Knowledge of secondary prevention will improve Outcome: Progressing Goal: Knowledge of patient specific risk factors addressed and post discharge goals established will improve Outcome: Progressing Goal: Individualized Educational Video(s) Outcome: Progressing   Problem: Coping: Goal: Will verbalize positive feelings about self Outcome: Progressing Goal: Will identify appropriate support needs Outcome: Progressing   Problem: Health Behavior/Discharge Planning: Goal: Ability to manage health-related needs will improve Outcome: Progressing   Problem: Self-Care: Goal: Ability to participate in self-care as condition permits will improve Outcome: Progressing Goal: Verbalization of feelings and concerns over difficulty with self-care will improve Outcome: Progressing Goal: Ability to communicate needs accurately will improve Outcome: Progressing   Problem: Nutrition: Goal: Risk of aspiration will decrease Outcome: Progressing

## 2020-02-21 NOTE — Progress Notes (Addendum)
[place]  [phone] [phone]     ID: [name] DOB: 11/05/1946  MR#: [id number]  VXB#:[id number]  Patient Care Team: Cari Caraway, MD as PCP - General (Family Medicine) [name], NP OTHER MD:  CHIEF COMPLAINT: Follow-up intracranial bleed, polycythemia  CURRENT TREATMENT: Pheresis, Hydrea, therapeutic phlebotomy  INTERVAL HISTORY: On-call hematologist contacted over the weekend due to hemoglobin of 17.3 and platelet count 654,000.  She was complaining of increased headaches without neurological symptoms and another unit of phlebotomy was repeated on 02/19/2020.  Labs from this morning showed that her hemoglobin has improved to 12.9 and her platelet count has normalized at 324,000.  Her WBC remains elevated at 19.5 which is consistent with prior values.  Her CT of the head performed on 02/19/2020 was stable.  REVIEW OF SYSTEMS: [name] is still having intermittent headaches.  However, these are better than they were over the weekend.  Seen by OT earlier today is still limited in her activity tolerance and ADL completion due to her headaches.  HISTORY OF CURRENT ILLNESS: From the original consult note:  Ms. Braman has a history of hypertension followed by Dr. Theadore Nan.  For the last few days she has had headache and she took her blood pressure and it was as high as 168/102 which is unusual for her.  As this was not improving she presented to the emergency room last night 02/13/2020. Dr Roxanne Mins obtained a basic lab work and a head CT.  The head CT without contrast showed an acute intraventricular hemorrhage in the right lateral ventricle, with no parenchymal component.  The lab work showed a white cell count of 12.0, hemoglobin 17.7, and platelets 990,000.  We were consulted for further evaluation and treatment.  PAST MEDICAL HISTORY: Past Medical History:  Diagnosis Date   Hepatitis C    Hyperlipidemia    Hypertension    Osteoporosis     PAST SURGICAL HISTORY: History reviewed. No pertinent surgical history.  FAMILY HISTORY History reviewed. No pertinent family history. The patient's father died at age 69 and the patient's mother at age 74 both from heart disease.  The patient has 1 sister, no brothers.  There is no family history of blood problems and no family history of cancer to the patient's knowledge  GYNECOLOGIC HISTORY:  No LMP recorded. Patient is postmenopausal. Menarche: 74 years old Age at first live birth: 74 years old [place] P 2 LMP 80 HRT yes, a few years  Hysterectomy?  Salpingo-oophorectomy?  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  Her husband [name] ran a business but is now retired.  Their son [name] is a Adult nurse.  Their son Aaron Edelman is an Pharmacologist.  The patient has 1 grandson and 4 step grandchildren.  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    ADVANCED DIRECTIVES: In the absence of any documents to the contrary the patient's husband is her healthcare power of attorney   HEALTH MAINTENANCE: Social History   Tobacco Use   Smoking status: Former Smoker   Smokeless tobacco: Never Used  Substance Use Topics   Alcohol use: No   Drug use: No     Colonoscopy: Out[facility]  PAP: [place]  Bone density:  Mammogram: Due   Allergies  Allergen Reactions   Erythromycin Itching and Other (See Comments)    Severe stomach pains, diarrhea    Current Facility-Administered Medications  Medication Dose Route Frequency Provider Last Rate Last Admin    stroke:  mapping our early stages of recovery book   Does not apply Once Greta Doom, MD       0.9 %  sodium chloride infusion   Intravenous Continuous Garvin Fila, MD 100 mL/hr at 02/19/20 1219 New Bag at 02/19/20 1219   acetaminophen (TYLENOL) tablet 650 mg  650 mg Oral Q6H PRN Rosalin Hawking, MD   650 mg at 02/21/20 0435   Or   acetaminophen (TYLENOL) 160 MG/5ML solution 650 mg  650 mg Per Tube Q6H PRN  Rosalin Hawking, MD       Or   acetaminophen (TYLENOL) suppository 650 mg  650 mg Rectal Q6H PRN Rosalin Hawking, MD       allopurinol (ZYLOPRIM) tablet 300 mg  300 mg Oral Daily Chace Bisch, Virgie Dad, MD   300 mg at 02/21/20 0926   amLODipine (NORVASC) tablet 10 mg  10 mg Oral Daily Rosalin Hawking, MD   10 mg at 02/21/20 0926   butalbital-acetaminophen-caffeine (FIORICET) 50-325-40 MG per tablet 1 tablet  1 tablet Oral Q8H PRN Rosalin Hawking, MD   1 tablet at 02/21/20 0624   chlorhexidine (PERIDEX) 0.12 % solution 15 mL  15 mL Mouth Rinse BID Greta Doom, MD   15 mL at 02/21/20 9794   hydrALAZINE (APRESOLINE) tablet 25 mg  25 mg Oral TID Rosalin Hawking, MD   25 mg at 02/21/20 8016   hydroxyurea (HYDREA) capsule 1,000 mg  1,000 mg Oral Daily Nitasha Jewel, Virgie Dad, MD   1,000 mg at 02/21/20 5537   labetalol (NORMODYNE) injection 10-20 mg  10-20 mg Intravenous Q2H PRN Rosalin Hawking, MD   20 mg at 02/19/20 2138   levothyroxine (SYNTHROID) tablet 125 mcg  125 mcg Oral QAC breakfast Rosalin Hawking, MD   125 mcg at 02/21/20 4827   losartan (COZAAR) tablet 50 mg  50 mg Oral BID Rosalin Hawking, MD   50 mg at 02/21/20 0926   ondansetron (ZOFRAN) injection 4 mg  4 mg Intravenous Q4H PRN Greta Doom, MD   4 mg at 02/18/20 1334   pantoprazole (PROTONIX) EC tablet 40 mg  40 mg Oral Daily Rosalin Hawking, MD   40 mg at 02/21/20 0926   [START ON 02/22/2020] predniSONE (DELTASONE) tablet 40 mg  40 mg Oral Daily Donzetta Starch, NP       Followed by   Derrill Memo ON 02/23/2020] predniSONE (DELTASONE) tablet 30 mg  30 mg Oral Daily Donzetta Starch, NP       senna-docusate (Senokot-S) tablet 1 tablet  1 tablet Oral BID Greta Doom, MD   1 tablet at 02/21/20 0926   sodium chloride flush (NS) 0.9 % injection 10-40 mL  10-40 mL Intracatheter Q12H Rosalin Hawking, MD   10 mL at 02/21/20 0905   sodium chloride flush (NS) 0.9 % injection 10-40 mL  10-40 mL Intracatheter PRN Rosalin Hawking, MD       topiramate (TOPAMAX) tablet 25 mg  25 mg  Oral BID Burnetta Sabin L, NP   25 mg at 02/21/20 1200    OBJECTIVE: .white woman examined in bed  Vitals:   02/21/20 0909 02/21/20 1148  BP:  (!) 150/68  Pulse:  94  Resp: 18 17  Temp:  98.6 F (37 C)  SpO2:  97%     Body mass index is 26.73 kg/m.   Wt Readings from Last 3 Encounters:  02/15/20 66.3 kg  08/15/14 67.6 kg    LAB RESULTS:  CMP  Component Value Date/Time   NA 140 02/21/2020 0403   K 3.3 (L) 02/21/2020 0403   CL 113 (H) 02/21/2020 0403   CO2 20 (L) 02/21/2020 0403   GLUCOSE 139 (H) 02/21/2020 0403   BUN 10 02/21/2020 0403   CREATININE 0.72 02/21/2020 0403   CALCIUM 8.2 (L) 02/21/2020 0403   PROT 4.5 (L) 02/20/2020 0414   ALBUMIN 2.9 (L) 02/20/2020 0414   AST 12 (L) 02/20/2020 0414   ALT 12 02/20/2020 0414   ALKPHOS 34 (L) 02/20/2020 0414   BILITOT 0.8 02/20/2020 0414   GFRNONAA >60 02/21/2020 0403   GFRAA >60 02/21/2020 0403    No results found for: TOTALPROTELP, ALBUMINELP, A1GS, A2GS, BETS, BETA2SER, GAMS, MSPIKE, SPEI  No results found for: KPAFRELGTCHN, LAMBDASER, KAPLAMBRATIO  Lab Results  Component Value Date   WBC 19.5 (H) 02/21/2020   NEUTROABS 17.3 (H) 02/21/2020   HGB 12.9 02/21/2020   HCT 38.4 02/21/2020   MCV 90.1 02/21/2020   PLT 324 02/21/2020    '@LASTCHEMISTRY' @  No results found for: LABCA2  No components found for: [id number]  No results for input(s): INR in the last 168 hours.  No results found for: LABCA2  No results found for: LKG401  No results found for: UUV253  No results found for: GUY403  No results found for: CA2729  No components found for: HGQUANT  No results found for: CEA1 / No results found for: CEA1   No results found for: AFPTUMOR  No results found for: CHROMOGRNA  No results found for: PSA1  No results displayed because visit has over 200 results.      (this displays the last labs from the last 3 days)  No results found for: TOTALPROTELP, ALBUMINELP, A1GS, A2GS, BETS, BETA2SER,  GAMS, MSPIKE, SPEI (this displays SPEP labs)  No results found for: KPAFRELGTCHN, LAMBDASER, KAPLAMBRATIO (kappa/lambda light chains)  No results found for: HGBA, HGBA2QUANT, HGBFQUANT, HGBSQUAN (Hemoglobinopathy evaluation)   No results found for: LDH  Lab Results  Component Value Date   IRON 73 02/14/2020   TIBC 358 02/14/2020   IRONPCTSAT 20 02/14/2020   (Iron and TIBC)  Lab Results  Component Value Date   FERRITIN 75 02/14/2020    Urinalysis    Component Value Date/Time   COLORURINE YELLOW 02/19/2020 1623   APPEARANCEUR CLEAR 02/19/2020 1623   LABSPEC 1.015 02/19/2020 1623   PHURINE 6.0 02/19/2020 1623   GLUCOSEU NEGATIVE 02/19/2020 1623   HGBUR NEGATIVE 02/19/2020 1623   BILIRUBINUR NEGATIVE 02/19/2020 1623   KETONESUR 20 (A) 02/19/2020 1623   PROTEINUR NEGATIVE 02/19/2020 1623   UROBILINOGEN 0.2 08/15/2014 2129   NITRITE NEGATIVE 02/19/2020 1623   LEUKOCYTESUR NEGATIVE 02/19/2020 1623     STUDIES: CT ANGIO HEAD W OR WO CONTRAST  Result Date: 02/19/2020 CLINICAL DATA:  Intraventricular hemorrhage EXAM: CT ANGIOGRAPHY HEAD TECHNIQUE: Multidetector CT imaging of the head was performed using the standard protocol during bolus administration of intravenous contrast. Multiplanar CT image reconstructions and MIPs were obtained to evaluate the vascular anatomy. CONTRAST:  2m OMNIPAQUE IOHEXOL 350 MG/ML SOLN COMPARISON:  None. FINDINGS: Artifact is present. CTA HEAD Anterior circulation: Intracranial internal carotid arteries are patent with mild calcified plaque. Anterior and middle cerebral arteries are patent. Posterior circulation: Intracranial vertebral arteries, basilar artery, and posterior cerebral arteries are patent. Venous sinuses: Not opacified. IMPRESSION: Suboptimal evaluation due to artifact. Patent anterior and posterior circulations. No abnormal vascularity identified. Electronically Signed   By: PMacy MisM.D.   On: 02/19/2020 13:09  CT HEAD WO  CONTRAST  Result Date: 02/19/2020 CLINICAL DATA:  74 year old female who presented with right side intraventricular hemorrhage on 02/14/2020. Altered mental status. History of polycythemia. Subsequent encounter. EXAM: CT HEAD WITHOUT CONTRAST TECHNIQUE: Contiguous axial images were obtained from the base of the skull through the vertex without intravenous contrast. COMPARISON:  CTA head and neck earlier today. Head CT yesterday, and earlier. FINDINGS: Brain: Continued stable volume of right lateral intraventricular hemorrhage. Small volume of layering hemorrhage in the contralateral left occipital horn is stable. No other intraventricular blood at this time. No ventriculomegaly. Pronounced bilateral cerebral white matter hypodensity is stable. No significant intracranial mass effect. Stable gray-white matter differentiation throughout the brain. No cortically based acute infarct identified. Vascular: Stable. Mild Calcified atherosclerosis at the skull base. Skull: Stable, negative. Sinuses/Orbits: Visualized paranasal sinuses and mastoids are stable and well pneumatized. Other: No acute orbit or scalp soft tissue finding. IMPRESSION: 1. Stable since 02/18/2020. Unchanged volume of IVH primarily in the right lateral ventricle. No intracranial mass effect. 2. Severe cerebral white matter disease. Numerous chronic microhemorrhages throughout the brain also demonstrated recently by MRI. Electronically Signed   By: Genevie Ann M.D.   On: 02/19/2020 20:39   CT HEAD WO CONTRAST  Result Date: 02/18/2020 CLINICAL DATA:  74 year old female who presented with right side intraventricular hemorrhage on 02/14/2020. History of polycythemia. Subsequent encounter. EXAM: CT HEAD WITHOUT CONTRAST TECHNIQUE: Contiguous axial images were obtained from the base of the skull through the vertex without intravenous contrast. COMPARISON:  Head CT 02/16/2020 and earlier. FINDINGS: Brain: Continued stable appearance of right lateral  intraventricular hemorrhage. Since 02/16/2020 a small volume of 4th ventricular blood has cleared. No significant 3rd ventricular blood is evident. Layering blood in the left occipital horn is stable. Stable ventricle size and configuration. No definite transependymal edema. No parenchymal extension of blood or new intracranial hemorrhage identified. Confluent bilateral cerebral white matter hypodensity is stable. No midline shift. Basilar cisterns remain patent. No cortically based acute infarct identified. Vascular: Calcified atherosclerosis at the skull base. Conspicuous increased vascular density probably reflects known polycythemia. Skull: Stable, negative. Sinuses/Orbits: Stable sinus aeration. Other: Visualized orbits and scalp soft tissues are within normal limits. IMPRESSION: 1. Unchanged size and configuration of right lateral intraventricular hemorrhage since 02/16/2020. Small volume of intraventricular hemorrhage elsewhere has regressed. No ventriculomegaly or transependymal edema. 2. No new intracranial abnormality. 3. Advanced white matter disease, and numerous chronic microhemorrhages throughout the brain demonstrated on recent MRI. Electronically Signed   By: Genevie Ann M.D.   On: 02/18/2020 20:11   CT HEAD WO CONTRAST  Result Date: 02/16/2020 CLINICAL DATA:  Intracranial hemorrhage, follow-up EXAM: CT HEAD WITHOUT CONTRAST TECHNIQUE: Contiguous axial images were obtained from the base of the skull through the vertex without intravenous contrast. COMPARISON:  02/14/2020 FINDINGS: Brain: Intraventricular hemorrhage is again identified primarily within the right lateral ventricle. There is no new hemorrhage. Caliber of the ventricles is similar. No new loss of gray differentiation. Confluent hypoattenuation is again identified supratentorial white matter. Vascular: No new finding. Skull: Remains unremarkable. Sinuses/Orbits: No acute finding. Other: None. IMPRESSION: No substantial change in  intraventricular hemorrhage primarily within the right lateral ventricle. No new hemorrhage. Stable caliber of the ventricles. Electronically Signed   By: Macy Mis M.D.   On: 02/16/2020 07:09   CT HEAD WO CONTRAST  Result Date: 02/14/2020 CLINICAL DATA:  Follow-up hemorrhage EXAM: CT HEAD WITHOUT CONTRAST TECHNIQUE: Contiguous axial images were obtained from the base of the skull through  the vertex without intravenous contrast. COMPARISON:  Earlier same day, 8 hours ago. FINDINGS: Brain: Intraventricular hemorrhage within the right lateral ventricle persists, approximately the same volume without evidence of ongoing or additional bleeding. Small amount of blood present within the fourth ventricle and left lateral ventricle. Small amount of subarachnoid blood visible within a left parietal sulcus. No overall change in ventricular size. Widespread chronic microangiopathic change of the white matter as seen previously. Old small vessel infarction in the right basal ganglia. No sign of acute infarction. I do not identify an intraparenchymal source. Vascular: There is atherosclerotic calcification of the major vessels at the base of the brain. Skull: Negative Sinuses/Orbits: Clear/normal Other: None IMPRESSION: No significant change since 8 hours ago. Intraventricular hemorrhage primarily within the right lateral ventricle. No evidence of ongoing bleeding. Small amount of blood dependent within the left lateral ventricle and within the fourth ventricle. Ventricular size is stable. Small amount of subarachnoid blood evident in a left parietal sulcus. Electronically Signed   By: Nelson Chimes M.D.   On: 02/14/2020 08:57   CT Head Wo Contrast  Result Date: 02/14/2020 CLINICAL DATA:  Acute onset headache EXAM: CT HEAD WITHOUT CONTRAST TECHNIQUE: Contiguous axial images were obtained from the base of the skull through the vertex without intravenous contrast. COMPARISON:  None. FINDINGS: Brain: There is acute  hemorrhage within the right lateral ventricle. No definite intraparenchymal component is identified. There is severe chronic white matter disease, most commonly indicating chronic ischemic microangiopathy. Volume of CSF spaces is normal. No midline shift or other mass effect Vascular: No abnormal hyperdensity of the major intracranial arteries or dural venous sinuses. No intracranial atherosclerosis. Skull: The visualized skull base, calvarium and extracranial soft tissues are normal. Sinuses/Orbits: No fluid levels or advanced mucosal thickening of the visualized paranasal sinuses. No mastoid or middle ear effusion. The orbits are normal. IMPRESSION: 1. Acute intraventricular hemorrhage within the right lateral ventricle. No definite parenchymal component identified. This is an unusual pattern, but is perhaps less unlikely in the setting of this patient's abnormal coagulation status. 2. Severe chronic ischemic microangiopathy. Critical Value/emergent results were called by telephone at the time of interpretation on 02/14/2020 at 12:31 am to provider DAVID [facility] , who verbally acknowledged these results. Electronically Signed   By: Ulyses Jarred M.D.   On: 02/14/2020 00:32   MR ANGIO HEAD WO CONTRAST  Result Date: 02/14/2020 CLINICAL DATA:  Follow-up cerebral hemorrhage. EXAM: MRI HEAD WITHOUT CONTRAST MRA HEAD WITHOUT CONTRAST TECHNIQUE: Multiplanar, multiecho pulse sequences of the brain and surrounding structures were obtained without intravenous contrast. Angiographic images of the head were obtained using MRA technique without contrast. COMPARISON:  Head CT February 14, 2020 6 FINDINGS: MRI HEAD FINDINGS Brain: Redemonstrated right intraventricular hemorrhage with extension into the third and fourth ventricle as well as the occipital horn of left lateral ventricle. The ventricular size remains stable. No midline shift. A loculated fluid collection measuring approximately 1 cm with hematocrit level is noted in the  left parietal region at midline (series 11, image 23). Extensive confluent foci of T2 hyperintensity are seen within the white matter of the cerebral hemispheres, basal ganglia, thalami and pons, nonspecific. Scattered punctate foci of susceptibility artifact are seen the bilateral cerebral hemispheres predominantly in subcortical location, bilateral thalami, pons and cerebellar hemispheres, likely representing hemosiderin deposit. This appears progressed since prior MRI. Vascular: Normal flow voids. Skull and upper cervical spine: Normal marrow signal. Sinuses/Orbits: Mild mucosal thickening of the right maxillary sinus. The orbits are maintained.  Other: None. MRA HEAD FINDINGS The visualized portions of the distal cervical and intracranial internal carotid arteries are widely patent with normal flow related enhancement. The bilateral anterior cerebral arteries and middle cerebral arteries are widely patent with antegrade flow without high-grade flow-limiting stenosis or proximal branch occlusion. No intracranial aneurysm within the anterior circulation. The vertebral arteries are widely patent with antegrade flow. The posterior inferior cerebral arteries are normal. Redemonstrated is a fenestration of the bilateral A1 segments. Vertebrobasilar junction and basilar artery are widely patent with antegrade flow without evidence of basilar stenosis or aneurysm. Posterior cerebral arteries are normal bilaterally. No intracranial aneurysm within the posterior circulation. IMPRESSION: 1. Stable right intraventricular hemorrhage with extension into the third and fourth ventricle. Stable ventricular size. No evidence of hydrocephalus. 2. Extensive confluent T2 hyperintensity within the white matter of the cerebral hemispheres, basal ganglia, thalami, and pons, nonspecific but may be related to chronic small vessel ischemic changes. 3. Multiple foci of susceptibility artifact throughout the brain parenchyma, progressed from  prior MRI. These may be related to microangiopathy versus amyloid angiopathy. Electronically Signed   By: Pedro Earls M.D.   On: 02/14/2020 14:54   MR BRAIN WO CONTRAST  Result Date: 02/14/2020 CLINICAL DATA:  Follow-up cerebral hemorrhage. EXAM: MRI HEAD WITHOUT CONTRAST MRA HEAD WITHOUT CONTRAST TECHNIQUE: Multiplanar, multiecho pulse sequences of the brain and surrounding structures were obtained without intravenous contrast. Angiographic images of the head were obtained using MRA technique without contrast. COMPARISON:  Head CT February 14, 2020 6 FINDINGS: MRI HEAD FINDINGS Brain: Redemonstrated right intraventricular hemorrhage with extension into the third and fourth ventricle as well as the occipital horn of left lateral ventricle. The ventricular size remains stable. No midline shift. A loculated fluid collection measuring approximately 1 cm with hematocrit level is noted in the left parietal region at midline (series 11, image 23). Extensive confluent foci of T2 hyperintensity are seen within the white matter of the cerebral hemispheres, basal ganglia, thalami and pons, nonspecific. Scattered punctate foci of susceptibility artifact are seen the bilateral cerebral hemispheres predominantly in subcortical location, bilateral thalami, pons and cerebellar hemispheres, likely representing hemosiderin deposit. This appears progressed since prior MRI. Vascular: Normal flow voids. Skull and upper cervical spine: Normal marrow signal. Sinuses/Orbits: Mild mucosal thickening of the right maxillary sinus. The orbits are maintained. Other: None. MRA HEAD FINDINGS The visualized portions of the distal cervical and intracranial internal carotid arteries are widely patent with normal flow related enhancement. The bilateral anterior cerebral arteries and middle cerebral arteries are widely patent with antegrade flow without high-grade flow-limiting stenosis or proximal branch occlusion. No intracranial  aneurysm within the anterior circulation. The vertebral arteries are widely patent with antegrade flow. The posterior inferior cerebral arteries are normal. Redemonstrated is a fenestration of the bilateral A1 segments. Vertebrobasilar junction and basilar artery are widely patent with antegrade flow without evidence of basilar stenosis or aneurysm. Posterior cerebral arteries are normal bilaterally. No intracranial aneurysm within the posterior circulation. IMPRESSION: 1. Stable right intraventricular hemorrhage with extension into the third and fourth ventricle. Stable ventricular size. No evidence of hydrocephalus. 2. Extensive confluent T2 hyperintensity within the white matter of the cerebral hemispheres, basal ganglia, thalami, and pons, nonspecific but may be related to chronic small vessel ischemic changes. 3. Multiple foci of susceptibility artifact throughout the brain parenchyma, progressed from prior MRI. These may be related to microangiopathy versus amyloid angiopathy. Electronically Signed   By: Pedro Earls M.D.   On: 02/14/2020  14:54   DG CHEST PORT 1 VIEW  Result Date: 02/14/2020 CLINICAL DATA:  Status post PICC placement EXAM: PORTABLE CHEST 1 VIEW COMPARISON:  August 15, 2014 FINDINGS: The heart size and mediastinal contours are within normal limits. Aortic knob calcifications are seen. A right-sided central venous catheter seen with the tip at the superior cavoatrial junction. No pneumothorax. No pleural effusion. No acute osseous abnormality. IMPRESSION: Interval placement of right-sided central venous catheter with the tip at the superior cavoatrial junction. Electronically Signed   By: Prudencio Pair M.D.   On: 02/14/2020 13:09   EEG adult  Result Date: 02/19/2020 Lora Havens, MD     02/19/2020  4:18 PM Patient Name: [name] MRN: [id number] Epilepsy Attending: Lora Havens Referring Physician/Provider: Dr Antony Contras Date: 02/19/2020 Duration: 25.15 mins  Patient history: 74yo F with right IVH. EEG to evaluate for seizure. Level of alertness: lethargic AEDs during EEG study: None Technical aspects: This EEG study was done with scalp electrodes positioned according to the 10-20 International system of electrode placement. Electrical activity was acquired at a sampling rate of '500Hz'  and reviewed with a high frequency filter of '70Hz'  and a low frequency filter of '1Hz' . EEG data were recorded continuously and digitally stored. DESCRIPTION: EEG showed continuous generalized 3-'6Hz'  theta-delta slowing. Triphasic waves, generalized, maximal bifrontal were also noted. Hyperventilation and photic stimulation were not performed. ABNORMALITY - Continuous slow, generalized - Triphasics waves, generalized IMPRESSION: This study is suggestive of moderate diffuse encephalopathy, non specific to etiology but could be secondary to toxic-metabolic causes. No seizures or epileptiform discharges were seen throughout the recording. Priyanka Barbra Sarks   CT BONE MARROW BIOPSY & ASPIRATION  Result Date: 02/15/2020 CLINICAL DATA:  Polycythemia EXAM: CT GUIDED DEEP ILIAC BONE ASPIRATION AND CORE BIOPSY TECHNIQUE: Patient was placed prone on the CT gantry and limited axial scans through the pelvis were obtained. Appropriate skin entry site was identified. Skin site was marked, prepped with chlorhexidine, draped in usual sterile fashion, and infiltrated locally with 1% lidocaine. Intravenous Fentanyl 75mg and Versed 175mwere administered as conscious sedation during continuous monitoring of the patient's level of consciousness and physiological / cardiorespiratory status by the radiology RN, with a total moderate sedation time of 12 minutes. Under CT fluoroscopic guidance an 11-gauge Cook trocar bone needle was advanced into the right iliac bone just lateral to the sacroiliac joint. Once needle tip position was confirmed, core and aspiration samples were obtained, submitted to pathology for  approval. Post procedure scans show no hematoma or fracture. Patient tolerated procedure well. COMPLICATIONS: COMPLICATIONS none IMPRESSION: 1. Technically successful CT guided right iliac bone core and aspiration biopsy. Electronically Signed   By: D Lucrezia Europe.D.   On: 02/15/2020 15:11   ECHOCARDIOGRAM COMPLETE  Result Date: 02/14/2020    ECHOCARDIOGRAM REPORT   Patient Name:   Victoria BADGETTate of Exam: 02/14/2020 Medical Rec #:  [id number]      Height:       62.0 in Accession #:    [id number]     Weight:       155.0 lb Date of Birth:  1008-Aug-1947     BSA:          1.715 m Patient Age:    [age]         BP:           121/56 mmHg Patient Gender: F  HR:           90 bpm. Exam Location:  Inpatient Procedure: 2D Echo Indications:    stroke 434.91  History:        Patient has prior history of Echocardiogram examinations, most                 recent 08/16/2014. Risk Factors:Hypertension and Dyslipidemia.  Sonographer:    Johny Chess Referring Phys: [id number] Witmer XU IMPRESSIONS  1. Left ventricular ejection fraction, by estimation, is 65 to 70%. The left ventricle has normal function. The left ventricle has no regional wall motion abnormalities. Left ventricular diastolic parameters were normal.  2. Right ventricular systolic function is normal. The right ventricular size is normal. Tricuspid regurgitation signal is inadequate for assessing PA pressure.  3. The mitral valve is grossly normal. No evidence of mitral valve regurgitation. No evidence of mitral stenosis.  4. The aortic valve is tricuspid. Aortic valve regurgitation is not visualized. Mild aortic valve sclerosis is present, with no evidence of aortic valve stenosis.  5. The inferior vena cava is normal in size with greater than 50% respiratory variability, suggesting right atrial pressure of 3 mmHg. Conclusion(s)/Recommendation(s): No intracardiac source of embolism detected on this transthoracic study. A transesophageal  echocardiogram is recommended to exclude cardiac source of embolism if clinically indicated. FINDINGS  Left Ventricle: Left ventricular ejection fraction, by estimation, is 65 to 70%. The left ventricle has normal function. The left ventricle has no regional wall motion abnormalities. The left ventricular internal cavity size was normal in size. There is  no left ventricular hypertrophy. Left ventricular diastolic parameters were normal. Right Ventricle: The right ventricular size is normal. No increase in right ventricular wall thickness. Right ventricular systolic function is normal. Tricuspid regurgitation signal is inadequate for assessing PA pressure. Left Atrium: Left atrial size was normal in size. Right Atrium: Right atrial size was normal in size. Pericardium: There is no evidence of pericardial effusion. Presence of pericardial fat pad. Mitral Valve: The mitral valve is grossly normal. Mild mitral annular calcification. No evidence of mitral valve regurgitation. No evidence of mitral valve stenosis. Tricuspid Valve: The tricuspid valve is grossly normal. Tricuspid valve regurgitation is not demonstrated. No evidence of tricuspid stenosis. Aortic Valve: The aortic valve is tricuspid. Aortic valve regurgitation is not visualized. Mild aortic valve sclerosis is present, with no evidence of aortic valve stenosis. Pulmonic Valve: The pulmonic valve was grossly normal. Pulmonic valve regurgitation is not visualized. No evidence of pulmonic stenosis. Aorta: The aortic root is normal in size and structure. Venous: The inferior vena cava is normal in size with greater than 50% respiratory variability, suggesting right atrial pressure of 3 mmHg. IAS/Shunts: The atrial septum is grossly normal.  LEFT VENTRICLE PLAX 2D LVIDd:         4.40 cm  Diastology LVIDs:         2.70 cm  LV e' lateral:   9.46 cm/s LV PW:         0.90 cm  LV E/e' lateral: 8.6 LV IVS:        0.70 cm  LV e' medial:    8.05 cm/s LVOT diam:     2.00  cm  LV E/e' medial:  10.1 LV SV:         74 LV SV Index:   43 LVOT Area:     3.14 cm  RIGHT VENTRICLE RV S prime:     17.10 cm/s TAPSE (M-mode): 2.5 cm LEFT ATRIUM  Index       RIGHT ATRIUM           Index LA diam:        3.40 cm 1.98 cm/m  RA Area:     11.40 cm LA Vol (A2C):   26.9 ml 15.68 ml/m RA Volume:   23.90 ml  13.93 ml/m LA Vol (A4C):   38.5 ml 22.44 ml/m LA Biplane Vol: 33.5 ml 19.53 ml/m  AORTIC VALVE LVOT Vmax:   111.00 cm/s LVOT Vmean:  70.600 cm/s LVOT VTI:    0.234 m MITRAL VALVE MV Area (PHT): 3.12 cm    SHUNTS MV Decel Time: 243 msec    Systemic VTI:  0.23 m MV E velocity: 81.00 cm/s  Systemic Diam: 2.00 cm MV A velocity: 99.80 cm/s MV E/A ratio:  0.81 Eleonore Chiquito MD Electronically signed by Eleonore Chiquito MD Signature Date/Time: 02/14/2020/4:33:15 PM    Final    [place] EKG SITE RITE  Result Date: 02/14/2020 If Site Rite image not attached, placement could not be confirmed due to current cardiac rhythm.   ELIGIBLE FOR AVAILABLE RESEARCH PROTOCOL: no  ASSESSMENT: 74 y.o. [place] woman presenting 02/13/2020 with headache, found to have a right lateral ventricle bleed, in the setting of panmyelosis, most c/w polycythemia vera.   (1) leukapheresis x3 . Last leukapheresis Friday 02/18/2020  (a) HD catheter removed after pheresis on  02/18/2020  (2) bone marrow biopsy 02/13/2020 shows no evidence of leukemia, c/w P Vera; additional confirmatory labs pending  (a) JAK2 mutation positive confirming diagnosis  (3) rule out acquired von Willebrand disease: borderling low ristocetin cofactor; does not meet criteria for vWD; will repeat once patient in remission  (4) intraventricular bleed: amyloid angiopathy? Other?  (a) outpatient neurologic follow-up?  PLAN: From our point of view, the patient may discharge to home when otherwise medically stable.  She should continue hydroxyurea upon discharge.  A prescription has already been sent to her pharmacy.  She has outpatient  follow-up already scheduled with cancer center for repeat lab work, follow-up visit, and possible phlebotomy on 02/25/2020.  [name], NP   02/21/2020 12:49 PM  ADDENDUM: Very good initial response in Hb and platelets. The WBC are up due to steroids.  GM

## 2020-02-22 ENCOUNTER — Inpatient Hospital Stay (HOSPITAL_COMMUNITY): Payer: Medicare HMO

## 2020-02-22 DIAGNOSIS — I4892 Unspecified atrial flutter: Secondary | ICD-10-CM | POA: Diagnosis not present

## 2020-02-22 DIAGNOSIS — I2699 Other pulmonary embolism without acute cor pulmonale: Secondary | ICD-10-CM | POA: Diagnosis not present

## 2020-02-22 DIAGNOSIS — I615 Nontraumatic intracerebral hemorrhage, intraventricular: Secondary | ICD-10-CM | POA: Diagnosis not present

## 2020-02-22 HISTORY — PX: IR IVC FILTER PLMT / S&I /IMG GUID/MOD SED: IMG701

## 2020-02-22 LAB — BASIC METABOLIC PANEL
Anion gap: 9 (ref 5–15)
BUN: 7 mg/dL — ABNORMAL LOW (ref 8–23)
CO2: 21 mmol/L — ABNORMAL LOW (ref 22–32)
Calcium: 8.8 mg/dL — ABNORMAL LOW (ref 8.9–10.3)
Chloride: 109 mmol/L (ref 98–111)
Creatinine, Ser: 0.68 mg/dL (ref 0.44–1.00)
GFR calc Af Amer: >60 mL/min (ref >60)
GFR calc non Af Amer: >60 mL/min (ref >60)
Glucose, Bld: 123 mg/dL — ABNORMAL HIGH (ref 70–99)
Potassium: 3.3 mmol/L — ABNORMAL LOW (ref 3.5–5.1)
Sodium: 139 mmol/L (ref 135–145)

## 2020-02-22 LAB — CBC WITH DIFFERENTIAL/PLATELET
Abs Immature Granulocytes: 0.27 10*3/uL — ABNORMAL HIGH (ref 0.00–0.07)
Basophils Absolute: 0 10*3/uL (ref 0.0–0.1)
Basophils Relative: 0 %
Eosinophils Absolute: 0 10*3/uL (ref 0.0–0.5)
Eosinophils Relative: 0 %
HCT: 40.4 % (ref 36.0–46.0)
Hemoglobin: 13.4 g/dL (ref 12.0–15.0)
Immature Granulocytes: 2 %
Lymphocytes Relative: 6 %
Lymphs Abs: 1.1 10*3/uL (ref 0.7–4.0)
MCH: 30 pg (ref 26.0–34.0)
MCHC: 33.2 g/dL (ref 30.0–36.0)
MCV: 90.6 fL (ref 80.0–100.0)
Monocytes Absolute: 0.9 10*3/uL (ref 0.1–1.0)
Monocytes Relative: 5 %
Neutro Abs: 16.2 10*3/uL — ABNORMAL HIGH (ref 1.7–7.7)
Neutrophils Relative %: 87 %
Platelets: 160 10*3/uL (ref 150–400)
RBC: 4.46 MIL/uL (ref 3.87–5.11)
RDW: 15.4 % (ref 11.5–15.5)
WBC: 18.4 10*3/uL — ABNORMAL HIGH (ref 4.0–10.5)
nRBC: 0 % (ref 0.0–0.2)

## 2020-02-22 MED ORDER — PREDNISONE 20 MG PO TABS
20.0000 mg | ORAL_TABLET | Freq: Every day | ORAL | Status: AC
  Administered 2020-02-24: 20 mg via ORAL
  Filled 2020-02-22: qty 1

## 2020-02-22 MED ORDER — FLUTICASONE FUROATE-VILANTEROL 200-25 MCG/INH IN AEPB
1.0000 | INHALATION_SPRAY | Freq: Every day | RESPIRATORY_TRACT | Status: DC
  Administered 2020-02-22: 1 via RESPIRATORY_TRACT
  Filled 2020-02-22: qty 28

## 2020-02-22 MED ORDER — MIDAZOLAM HCL 2 MG/2ML IJ SOLN
INTRAMUSCULAR | Status: AC
  Filled 2020-02-22: qty 2

## 2020-02-22 MED ORDER — PREDNISONE 10 MG PO TABS
5.0000 mg | ORAL_TABLET | Freq: Every day | ORAL | Status: AC
  Administered 2020-02-26: 5 mg via ORAL
  Filled 2020-02-22: qty 1

## 2020-02-22 MED ORDER — FENTANYL CITRATE (PF) 100 MCG/2ML IJ SOLN
INTRAMUSCULAR | Status: AC
  Filled 2020-02-22: qty 2

## 2020-02-22 MED ORDER — FENTANYL CITRATE (PF) 100 MCG/2ML IJ SOLN
INTRAMUSCULAR | Status: AC | PRN
  Administered 2020-02-22 (×2): 25 ug via INTRAVENOUS

## 2020-02-22 MED ORDER — LIDOCAINE HCL 1 % IJ SOLN
INTRAMUSCULAR | Status: AC | PRN
  Administered 2020-02-22: 5 mL

## 2020-02-22 MED ORDER — MIDAZOLAM HCL 2 MG/2ML IJ SOLN
INTRAMUSCULAR | Status: AC | PRN
  Administered 2020-02-22: 0.5 mg via INTRAVENOUS

## 2020-02-22 MED ORDER — PREDNISONE 10 MG PO TABS: 5.0000 mg | ORAL_TABLET | Freq: Every day | ORAL | Status: DC

## 2020-02-22 MED ORDER — LIDOCAINE HCL 1 % IJ SOLN
INTRAMUSCULAR | Status: AC
  Filled 2020-02-22: qty 20

## 2020-02-22 MED ORDER — PREDNISONE 10 MG PO TABS
10.0000 mg | ORAL_TABLET | Freq: Every day | ORAL | Status: AC
  Administered 2020-02-25: 10 mg via ORAL
  Filled 2020-02-22: qty 1

## 2020-02-22 MED ORDER — IOHEXOL 300 MG/ML  SOLN
100.0000 mL | Freq: Once | INTRAMUSCULAR | Status: AC | PRN
  Administered 2020-02-22: 40 mL via INTRAVENOUS

## 2020-02-22 MED ORDER — IOHEXOL 350 MG/ML SOLN
100.0000 mL | Freq: Once | INTRAVENOUS | Status: AC | PRN
  Administered 2020-02-22: 100 mL via INTRAVENOUS

## 2020-02-22 NOTE — Progress Notes (Signed)
Pt taken down to CT PE protocol. Impression showed Acute bilateral lower lobe pulmonary emboli are noted. Primary team NP notified as well as Dr Tamala Julian. See orders

## 2020-02-22 NOTE — Consult Note (Signed)
Chief Complaint: ICB with PE - IVC filter placement  Referring Physician(s): Dr. Leonie Man  Supervising Physician: Markus Daft  Patient Status: [facility] - In-pt  History of Present Illness: [name] is a 74 y.o. female Admitted for headaches found to have a right lateral ventricle ICB, JAK2 mutuation, polycythemia vera and PE . Team is requesting IVC filter placement.  Past Medical History:  Diagnosis Date  . Hepatitis C   . Hyperlipidemia   . Hypertension   . Osteoporosis     History reviewed. No pertinent surgical history.  Allergies: Erythromycin  Medications: Prior to Admission medications   Medication Sig Start Date End Date Taking? Authorizing Provider  ALPRAZolam Duanne Moron) 0.5 MG tablet Take 0.25 mg by mouth daily as needed for anxiety.    Yes [provider]  amLODipine (NORVASC) 5 MG tablet Take 5 mg by mouth daily. 01/26/20  Yes [provider]  aspirin 81 MG tablet Take 1 tablet (81 mg total) by mouth daily. 08/17/14  Yes Rai, Ripudeep K, MD  atorvastatin (LIPITOR) 10 MG tablet Take 10 mg by mouth every evening.    Yes [provider]  Cholecalciferol (VITAMIN D3) 2000 UNITS TABS Take 2,000 Units by mouth every evening.   Yes [provider]  levothyroxine (SYNTHROID, LEVOTHROID) 125 MCG tablet Take 125 mcg by mouth daily before breakfast.   Yes [provider]  losartan (COZAAR) 100 MG tablet Take 100 mg by mouth every evening.   Yes [provider]  Probiotic Product (ALIGN PO) Take 1 capsule by mouth daily.   Yes [provider]  SYMBICORT 160-4.5 MCG/ACT inhaler Inhale 1 puff into the lungs daily as needed (shortness of breath).  12/03/19  Yes [provider]  TRELEGY ELLIPTA 100-62.5-25 MCG/INH AEPB Inhale 1 puff into the lungs daily as needed (shortness of breath).  02/03/20  Yes [provider]  allopurinol (ZYLOPRIM) 300 MG tablet Take 1 tablet (300 mg total) by mouth daily.  02/18/20   Magrinat, Virgie Dad, MD  hydroxyurea (HYDREA) 500 MG capsule Take 1 capsule (500 mg total) by mouth daily. May take with food to minimize GI side effects. 02/18/20   Magrinat, Virgie Dad, MD     History reviewed. No pertinent family history.  Social History   Socioeconomic History  . Marital status: Married    Spouse name: Not on file  . Number of children: Not on file  . Years of education: Not on file  . Highest education level: Not on file  Occupational History  . Not on file  Tobacco Use  . Smoking status: Former Research scientist (life sciences)  . Smokeless tobacco: Never Used  Substance and Sexual Activity  . Alcohol use: No  . Drug use: No  . Sexual activity: Not on file  Other Topics Concern  . Not on file  Social History Narrative  . Not on file   Social Determinants of Health   Financial Resource Strain:   . Difficulty of Paying Living Expenses:   Food Insecurity:   . Worried About Charity fundraiser in the Last Year:   . Arboriculturist in the Last Year:   Transportation Needs:   . Film/video editor (Medical):   Marland Kitchen Lack of Transportation (Non-Medical):   Physical Activity:   . Days of Exercise per Week:   . Minutes of Exercise per Session:   Stress:   . Feeling of Stress :   Social Connections:   . Frequency of Communication  with Friends and Family:   . Frequency of Social Gatherings with Friends and Family:   . Attends Religious Services:   . Active Member of Clubs or Organizations:   . Attends Archivist Meetings:   Marland Kitchen Marital Status:      Review of Systems: A 12 point ROS discussed and pertinent positives are indicated in the HPI above.  All other systems are negative.  Review of Systems  Constitutional: Negative for fatigue and fever.  HENT: Negative for congestion.   Respiratory: Negative for cough and shortness of breath.   Gastrointestinal: Negative for abdominal pain, diarrhea, nausea and vomiting.    Vital Signs: BP (!) 136/56   Pulse (!) 102    Temp 98.1 F (36.7 C) (Oral)   Resp (!) 28   Ht '5\' 2"'  (1.575 m)   Wt 146 lb 2.6 oz (66.3 kg)   SpO2 98%   BMI 26.73 kg/m   Physical Exam Vitals and nursing note reviewed.  Constitutional:      Appearance: She is well-developed.  HENT:     Head: Normocephalic and atraumatic.  Eyes:     Conjunctiva/sclera: Conjunctivae normal.  Cardiovascular:     Rate and Rhythm: Normal rate and regular rhythm.     Heart sounds: Normal heart sounds.  Pulmonary:     Effort: Pulmonary effort is normal.  Musculoskeletal:        General: Normal range of motion.     Cervical back: Normal range of motion.  Skin:    General: Skin is warm.  Neurological:     Mental Status: She is alert.     Comments: Patient is alert and oriented but intermittedly confused.      Imaging: CT ANGIO HEAD W OR WO CONTRAST  Result Date: 02/19/2020 CLINICAL DATA:  Intraventricular hemorrhage EXAM: CT ANGIOGRAPHY HEAD TECHNIQUE: Multidetector CT imaging of the head was performed using the standard protocol during bolus administration of intravenous contrast. Multiplanar CT image reconstructions and MIPs were obtained to evaluate the vascular anatomy. CONTRAST:  4m OMNIPAQUE IOHEXOL 350 MG/ML SOLN COMPARISON:  None. FINDINGS: Artifact is present. CTA HEAD Anterior circulation: Intracranial internal carotid arteries are patent with mild calcified plaque. Anterior and middle cerebral arteries are patent. Posterior circulation: Intracranial vertebral arteries, basilar artery, and posterior cerebral arteries are patent. Venous sinuses: Not opacified. IMPRESSION: Suboptimal evaluation due to artifact. Patent anterior and posterior circulations. No abnormal vascularity identified. Electronically Signed   By: PMacy MisM.D.   On: 02/19/2020 13:09   DG Chest 2 View  Result Date: 02/22/2020 CLINICAL DATA:  Shortness of breath EXAM: CHEST - 2 VIEW COMPARISON:  February 14, 2020 FINDINGS: There is atelectatic change in the right  base. Lungs elsewhere are clear. Heart size and pulmonary vascularity are normal. No adenopathy. No bone lesions. IMPRESSION: Right base atelectasis. Lungs elsewhere clear. Cardiac silhouette within normal limits. Electronically Signed   By: WLowella GripIII M.D.   On: 02/22/2020 09:20   CT HEAD WO CONTRAST  Result Date: 02/19/2020 CLINICAL DATA:  74year old female who presented with right side intraventricular hemorrhage on 02/14/2020. Altered mental status. History of polycythemia. Subsequent encounter. EXAM: CT HEAD WITHOUT CONTRAST TECHNIQUE: Contiguous axial images were obtained from the base of the skull through the vertex without intravenous contrast. COMPARISON:  CTA head and neck earlier today. Head CT yesterday, and earlier. FINDINGS: Brain: Continued stable volume of right lateral intraventricular hemorrhage. Small volume of layering hemorrhage in the contralateral left occipital horn is stable.  No other intraventricular blood at this time. No ventriculomegaly. Pronounced bilateral cerebral white matter hypodensity is stable. No significant intracranial mass effect. Stable gray-white matter differentiation throughout the brain. No cortically based acute infarct identified. Vascular: Stable. Mild Calcified atherosclerosis at the skull base. Skull: Stable, negative. Sinuses/Orbits: Visualized paranasal sinuses and mastoids are stable and well pneumatized. Other: No acute orbit or scalp soft tissue finding. IMPRESSION: 1. Stable since 02/18/2020. Unchanged volume of IVH primarily in the right lateral ventricle. No intracranial mass effect. 2. Severe cerebral white matter disease. Numerous chronic microhemorrhages throughout the brain also demonstrated recently by MRI. Electronically Signed   By: Genevie Ann M.D.   On: 02/19/2020 20:39   CT HEAD WO CONTRAST  Result Date: 02/18/2020 CLINICAL DATA:  74 year old female who presented with right side intraventricular hemorrhage on 02/14/2020. History of  polycythemia. Subsequent encounter. EXAM: CT HEAD WITHOUT CONTRAST TECHNIQUE: Contiguous axial images were obtained from the base of the skull through the vertex without intravenous contrast. COMPARISON:  Head CT 02/16/2020 and earlier. FINDINGS: Brain: Continued stable appearance of right lateral intraventricular hemorrhage. Since 02/16/2020 a small volume of 4th ventricular blood has cleared. No significant 3rd ventricular blood is evident. Layering blood in the left occipital horn is stable. Stable ventricle size and configuration. No definite transependymal edema. No parenchymal extension of blood or new intracranial hemorrhage identified. Confluent bilateral cerebral white matter hypodensity is stable. No midline shift. Basilar cisterns remain patent. No cortically based acute infarct identified. Vascular: Calcified atherosclerosis at the skull base. Conspicuous increased vascular density probably reflects known polycythemia. Skull: Stable, negative. Sinuses/Orbits: Stable sinus aeration. Other: Visualized orbits and scalp soft tissues are within normal limits. IMPRESSION: 1. Unchanged size and configuration of right lateral intraventricular hemorrhage since 02/16/2020. Small volume of intraventricular hemorrhage elsewhere has regressed. No ventriculomegaly or transependymal edema. 2. No new intracranial abnormality. 3. Advanced white matter disease, and numerous chronic microhemorrhages throughout the brain demonstrated on recent MRI. Electronically Signed   By: Genevie Ann M.D.   On: 02/18/2020 20:11   CT HEAD WO CONTRAST  Result Date: 02/16/2020 CLINICAL DATA:  Intracranial hemorrhage, follow-up EXAM: CT HEAD WITHOUT CONTRAST TECHNIQUE: Contiguous axial images were obtained from the base of the skull through the vertex without intravenous contrast. COMPARISON:  02/14/2020 FINDINGS: Brain: Intraventricular hemorrhage is again identified primarily within the right lateral ventricle. There is no new hemorrhage.  Caliber of the ventricles is similar. No new loss of gray differentiation. Confluent hypoattenuation is again identified supratentorial white matter. Vascular: No new finding. Skull: Remains unremarkable. Sinuses/Orbits: No acute finding. Other: None. IMPRESSION: No substantial change in intraventricular hemorrhage primarily within the right lateral ventricle. No new hemorrhage. Stable caliber of the ventricles. Electronically Signed   By: Macy Mis M.D.   On: 02/16/2020 07:09   CT HEAD WO CONTRAST  Result Date: 02/14/2020 CLINICAL DATA:  Follow-up hemorrhage EXAM: CT HEAD WITHOUT CONTRAST TECHNIQUE: Contiguous axial images were obtained from the base of the skull through the vertex without intravenous contrast. COMPARISON:  Earlier same day, 8 hours ago. FINDINGS: Brain: Intraventricular hemorrhage within the right lateral ventricle persists, approximately the same volume without evidence of ongoing or additional bleeding. Small amount of blood present within the fourth ventricle and left lateral ventricle. Small amount of subarachnoid blood visible within a left parietal sulcus. No overall change in ventricular size. Widespread chronic microangiopathic change of the white matter as seen previously. Old small vessel infarction in the right basal ganglia. No sign of acute infarction. I  do not identify an intraparenchymal source. Vascular: There is atherosclerotic calcification of the major vessels at the base of the brain. Skull: Negative Sinuses/Orbits: Clear/normal Other: None IMPRESSION: No significant change since 8 hours ago. Intraventricular hemorrhage primarily within the right lateral ventricle. No evidence of ongoing bleeding. Small amount of blood dependent within the left lateral ventricle and within the fourth ventricle. Ventricular size is stable. Small amount of subarachnoid blood evident in a left parietal sulcus. Electronically Signed   By: Nelson Chimes M.D.   On: 02/14/2020 08:57   CT Head  Wo Contrast  Result Date: 02/14/2020 CLINICAL DATA:  Acute onset headache EXAM: CT HEAD WITHOUT CONTRAST TECHNIQUE: Contiguous axial images were obtained from the base of the skull through the vertex without intravenous contrast. COMPARISON:  None. FINDINGS: Brain: There is acute hemorrhage within the right lateral ventricle. No definite intraparenchymal component is identified. There is severe chronic white matter disease, most commonly indicating chronic ischemic microangiopathy. Volume of CSF spaces is normal. No midline shift or other mass effect Vascular: No abnormal hyperdensity of the major intracranial arteries or dural venous sinuses. No intracranial atherosclerosis. Skull: The visualized skull base, calvarium and extracranial soft tissues are normal. Sinuses/Orbits: No fluid levels or advanced mucosal thickening of the visualized paranasal sinuses. No mastoid or middle ear effusion. The orbits are normal. IMPRESSION: 1. Acute intraventricular hemorrhage within the right lateral ventricle. No definite parenchymal component identified. This is an unusual pattern, but is perhaps less unlikely in the setting of this patient's abnormal coagulation status. 2. Severe chronic ischemic microangiopathy. Critical Value/emergent results were called by telephone at the time of interpretation on 02/14/2020 at 12:31 am to provider DAVID [facility] , who verbally acknowledged these results. Electronically Signed   By: Ulyses Jarred M.D.   On: 02/14/2020 00:32   CT ANGIO CHEST PE W OR WO CONTRAST  Addendum Date: 02/22/2020   ADDENDUM REPORT: 02/22/2020 12:39 ADDENDUM: Critical Value/emergent results were called by telephone at the time of interpretation on 02/22/2020 at 12:32 pm to provider Dr. Leonie Man, who verbally acknowledged these results. Electronically Signed   By: Marijo Conception M.D.   On: 02/22/2020 12:39   Result Date: 02/22/2020 CLINICAL DATA:  Shortness of breath. EXAM: CT ANGIOGRAPHY CHEST WITH CONTRAST  TECHNIQUE: Multidetector CT imaging of the chest was performed using the standard protocol during bolus administration of intravenous contrast. Multiplanar CT image reconstructions and MIPs were obtained to evaluate the vascular anatomy. CONTRAST:  176m OMNIPAQUE IOHEXOL 350 MG/ML SOLN COMPARISON:  None. FINDINGS: Cardiovascular: Filling defects are noted in the lower lobe branches of both pulmonary arteries consistent with acute pulmonary emboli. Normal cardiac size. No pericardial effusion. Coronary artery calcifications are noted. Atherosclerosis of thoracic aorta is noted without aneurysm formation. Mediastinum/Nodes: No enlarged mediastinal, hilar, or axillary lymph nodes. Thyroid gland, trachea, and esophagus demonstrate no significant findings. Lungs/Pleura: No pneumothorax or pleural effusion is noted. Emphysematous disease is noted in the upper lobes bilaterally. Nodular densities are noted laterally in the right lower lobe most consistent with atypical inflammation or other inflammation. Right middle lobe opacity is noted concerning for atelectasis or possibly inflammation. Upper Abdomen: Probable exophytic cyst seen arising from upper pole of left kidney. Musculoskeletal: No chest wall abnormality. No acute or significant osseous findings. Review of the MIP images confirms the above findings. IMPRESSION: 1. Acute bilateral lower lobe pulmonary emboli are noted. 2. Coronary artery calcifications are noted suggesting coronary artery disease. 3. Emphysematous disease is noted in the upper lobes bilaterally.  4. Nodular densities are noted laterally in the right lower lobe most consistent with atypical inflammation or other inflammation. Right middle lobe opacity is noted concerning for atelectasis or possibly inflammation. Follow-up unenhanced chest CT in 3-4 weeks is recommended to ensure resolution and rule out neoplasm. Aortic Atherosclerosis (ICD10-I70.0) and Emphysema (ICD10-J43.9). Electronically  Signed: By: Marijo Conception M.D. On: 02/22/2020 12:26   MR ANGIO HEAD WO CONTRAST  Result Date: 02/14/2020 CLINICAL DATA:  Follow-up cerebral hemorrhage. EXAM: MRI HEAD WITHOUT CONTRAST MRA HEAD WITHOUT CONTRAST TECHNIQUE: Multiplanar, multiecho pulse sequences of the brain and surrounding structures were obtained without intravenous contrast. Angiographic images of the head were obtained using MRA technique without contrast. COMPARISON:  Head CT February 14, 2020 6 FINDINGS: MRI HEAD FINDINGS Brain: Redemonstrated right intraventricular hemorrhage with extension into the third and fourth ventricle as well as the occipital horn of left lateral ventricle. The ventricular size remains stable. No midline shift. A loculated fluid collection measuring approximately 1 cm with hematocrit level is noted in the left parietal region at midline (series 11, image 23). Extensive confluent foci of T2 hyperintensity are seen within the white matter of the cerebral hemispheres, basal ganglia, thalami and pons, nonspecific. Scattered punctate foci of susceptibility artifact are seen the bilateral cerebral hemispheres predominantly in subcortical location, bilateral thalami, pons and cerebellar hemispheres, likely representing hemosiderin deposit. This appears progressed since prior MRI. Vascular: Normal flow voids. Skull and upper cervical spine: Normal marrow signal. Sinuses/Orbits: Mild mucosal thickening of the right maxillary sinus. The orbits are maintained. Other: None. MRA HEAD FINDINGS The visualized portions of the distal cervical and intracranial internal carotid arteries are widely patent with normal flow related enhancement. The bilateral anterior cerebral arteries and middle cerebral arteries are widely patent with antegrade flow without high-grade flow-limiting stenosis or proximal branch occlusion. No intracranial aneurysm within the anterior circulation. The vertebral arteries are widely patent with antegrade flow.  The posterior inferior cerebral arteries are normal. Redemonstrated is a fenestration of the bilateral A1 segments. Vertebrobasilar junction and basilar artery are widely patent with antegrade flow without evidence of basilar stenosis or aneurysm. Posterior cerebral arteries are normal bilaterally. No intracranial aneurysm within the posterior circulation. IMPRESSION: 1. Stable right intraventricular hemorrhage with extension into the third and fourth ventricle. Stable ventricular size. No evidence of hydrocephalus. 2. Extensive confluent T2 hyperintensity within the white matter of the cerebral hemispheres, basal ganglia, thalami, and pons, nonspecific but may be related to chronic small vessel ischemic changes. 3. Multiple foci of susceptibility artifact throughout the brain parenchyma, progressed from prior MRI. These may be related to microangiopathy versus amyloid angiopathy. Electronically Signed   By: Pedro Earls M.D.   On: 02/14/2020 14:54   MR BRAIN WO CONTRAST  Result Date: 02/14/2020 CLINICAL DATA:  Follow-up cerebral hemorrhage. EXAM: MRI HEAD WITHOUT CONTRAST MRA HEAD WITHOUT CONTRAST TECHNIQUE: Multiplanar, multiecho pulse sequences of the brain and surrounding structures were obtained without intravenous contrast. Angiographic images of the head were obtained using MRA technique without contrast. COMPARISON:  Head CT February 14, 2020 6 FINDINGS: MRI HEAD FINDINGS Brain: Redemonstrated right intraventricular hemorrhage with extension into the third and fourth ventricle as well as the occipital horn of left lateral ventricle. The ventricular size remains stable. No midline shift. A loculated fluid collection measuring approximately 1 cm with hematocrit level is noted in the left parietal region at midline (series 11, image 23). Extensive confluent foci of T2 hyperintensity are seen within the white matter of the  cerebral hemispheres, basal ganglia, thalami and pons, nonspecific.  Scattered punctate foci of susceptibility artifact are seen the bilateral cerebral hemispheres predominantly in subcortical location, bilateral thalami, pons and cerebellar hemispheres, likely representing hemosiderin deposit. This appears progressed since prior MRI. Vascular: Normal flow voids. Skull and upper cervical spine: Normal marrow signal. Sinuses/Orbits: Mild mucosal thickening of the right maxillary sinus. The orbits are maintained. Other: None. MRA HEAD FINDINGS The visualized portions of the distal cervical and intracranial internal carotid arteries are widely patent with normal flow related enhancement. The bilateral anterior cerebral arteries and middle cerebral arteries are widely patent with antegrade flow without high-grade flow-limiting stenosis or proximal branch occlusion. No intracranial aneurysm within the anterior circulation. The vertebral arteries are widely patent with antegrade flow. The posterior inferior cerebral arteries are normal. Redemonstrated is a fenestration of the bilateral A1 segments. Vertebrobasilar junction and basilar artery are widely patent with antegrade flow without evidence of basilar stenosis or aneurysm. Posterior cerebral arteries are normal bilaterally. No intracranial aneurysm within the posterior circulation. IMPRESSION: 1. Stable right intraventricular hemorrhage with extension into the third and fourth ventricle. Stable ventricular size. No evidence of hydrocephalus. 2. Extensive confluent T2 hyperintensity within the white matter of the cerebral hemispheres, basal ganglia, thalami, and pons, nonspecific but may be related to chronic small vessel ischemic changes. 3. Multiple foci of susceptibility artifact throughout the brain parenchyma, progressed from prior MRI. These may be related to microangiopathy versus amyloid angiopathy. Electronically Signed   By: Pedro Earls M.D.   On: 02/14/2020 14:54   DG CHEST PORT 1 VIEW  Result Date:  02/14/2020 CLINICAL DATA:  Status post PICC placement EXAM: PORTABLE CHEST 1 VIEW COMPARISON:  August 15, 2014 FINDINGS: The heart size and mediastinal contours are within normal limits. Aortic knob calcifications are seen. A right-sided central venous catheter seen with the tip at the superior cavoatrial junction. No pneumothorax. No pleural effusion. No acute osseous abnormality. IMPRESSION: Interval placement of right-sided central venous catheter with the tip at the superior cavoatrial junction. Electronically Signed   By: Prudencio Pair M.D.   On: 02/14/2020 13:09   EEG adult  Result Date: 02/19/2020 Lora Havens, MD     02/19/2020  4:18 PM Patient Name: [name] MRN: [id number] Epilepsy Attending: Lora Havens Referring Physician/Provider: Dr Antony Contras Date: 02/19/2020 Duration: 25.15 mins Patient history: 74yo F with right IVH. EEG to evaluate for seizure. Level of alertness: lethargic AEDs during EEG study: None Technical aspects: This EEG study was done with scalp electrodes positioned according to the 10-20 International system of electrode placement. Electrical activity was acquired at a sampling rate of '500Hz'  and reviewed with a high frequency filter of '70Hz'  and a low frequency filter of '1Hz' . EEG data were recorded continuously and digitally stored. DESCRIPTION: EEG showed continuous generalized 3-'6Hz'  theta-delta slowing. Triphasic waves, generalized, maximal bifrontal were also noted. Hyperventilation and photic stimulation were not performed. ABNORMALITY - Continuous slow, generalized - Triphasics waves, generalized IMPRESSION: This study is suggestive of moderate diffuse encephalopathy, non specific to etiology but could be secondary to toxic-metabolic causes. No seizures or epileptiform discharges were seen throughout the recording. Priyanka Barbra Sarks   CT BONE MARROW BIOPSY & ASPIRATION  Result Date: 02/15/2020 CLINICAL DATA:  Polycythemia EXAM: CT GUIDED DEEP ILIAC BONE  ASPIRATION AND CORE BIOPSY TECHNIQUE: Patient was placed prone on the CT gantry and limited axial scans through the pelvis were obtained. Appropriate skin entry site was identified. Skin site was  marked, prepped with chlorhexidine, draped in usual sterile fashion, and infiltrated locally with 1% lidocaine. Intravenous Fentanyl 35mg and Versed 191mwere administered as conscious sedation during continuous monitoring of the patient's level of consciousness and physiological / cardiorespiratory status by the radiology RN, with a total moderate sedation time of 12 minutes. Under CT fluoroscopic guidance an 11-gauge Cook trocar bone needle was advanced into the right iliac bone just lateral to the sacroiliac joint. Once needle tip position was confirmed, core and aspiration samples were obtained, submitted to pathology for approval. Post procedure scans show no hematoma or fracture. Patient tolerated procedure well. COMPLICATIONS: COMPLICATIONS none IMPRESSION: 1. Technically successful CT guided right iliac bone core and aspiration biopsy. Electronically Signed   By: D Lucrezia Europe.D.   On: 02/15/2020 15:11   ECHOCARDIOGRAM COMPLETE  Result Date: 02/14/2020    ECHOCARDIOGRAM REPORT   Patient Name:   CAOPLE GIRGISate of Exam: 02/14/2020 Medical Rec #:  [id number]      Height:       62.0 in Accession #:    [id number]     Weight:       155.0 lb Date of Birth:  1001-23-1947     BSA:          1.715 m Patient Age:    [age]         BP:           121/56 mmHg Patient Gender: F                HR:           90 bpm. Exam Location:  Inpatient Procedure: 2D Echo Indications:    stroke 434.91  History:        Patient has prior history of Echocardiogram examinations, most                 recent 08/16/2014. Risk Factors:Hypertension and Dyslipidemia.  Sonographer:    LaJohny Chesseferring Phys: [id number] CityU IMPRESSIONS  1. Left ventricular ejection fraction, by estimation, is 65 to 70%. The left ventricle has  normal function. The left ventricle has no regional wall motion abnormalities. Left ventricular diastolic parameters were normal.  2. Right ventricular systolic function is normal. The right ventricular size is normal. Tricuspid regurgitation signal is inadequate for assessing PA pressure.  3. The mitral valve is grossly normal. No evidence of mitral valve regurgitation. No evidence of mitral stenosis.  4. The aortic valve is tricuspid. Aortic valve regurgitation is not visualized. Mild aortic valve sclerosis is present, with no evidence of aortic valve stenosis.  5. The inferior vena cava is normal in size with greater than 50% respiratory variability, suggesting right atrial pressure of 3 mmHg. Conclusion(s)/Recommendation(s): No intracardiac source of embolism detected on this transthoracic study. A transesophageal echocardiogram is recommended to exclude cardiac source of embolism if clinically indicated. FINDINGS  Left Ventricle: Left ventricular ejection fraction, by estimation, is 65 to 70%. The left ventricle has normal function. The left ventricle has no regional wall motion abnormalities. The left ventricular internal cavity size was normal in size. There is  no left ventricular hypertrophy. Left ventricular diastolic parameters were normal. Right Ventricle: The right ventricular size is normal. No increase in right ventricular wall thickness. Right ventricular systolic function is normal. Tricuspid regurgitation signal is inadequate for assessing PA pressure. Left Atrium: Left atrial size was normal in size. Right Atrium: Right atrial size was normal in size.  Pericardium: There is no evidence of pericardial effusion. Presence of pericardial fat pad. Mitral Valve: The mitral valve is grossly normal. Mild mitral annular calcification. No evidence of mitral valve regurgitation. No evidence of mitral valve stenosis. Tricuspid Valve: The tricuspid valve is grossly normal. Tricuspid valve regurgitation is not  demonstrated. No evidence of tricuspid stenosis. Aortic Valve: The aortic valve is tricuspid. Aortic valve regurgitation is not visualized. Mild aortic valve sclerosis is present, with no evidence of aortic valve stenosis. Pulmonic Valve: The pulmonic valve was grossly normal. Pulmonic valve regurgitation is not visualized. No evidence of pulmonic stenosis. Aorta: The aortic root is normal in size and structure. Venous: The inferior vena cava is normal in size with greater than 50% respiratory variability, suggesting right atrial pressure of 3 mmHg. IAS/Shunts: The atrial septum is grossly normal.  LEFT VENTRICLE PLAX 2D LVIDd:         4.40 cm  Diastology LVIDs:         2.70 cm  LV e' lateral:   9.46 cm/s LV PW:         0.90 cm  LV E/e' lateral: 8.6 LV IVS:        0.70 cm  LV e' medial:    8.05 cm/s LVOT diam:     2.00 cm  LV E/e' medial:  10.1 LV SV:         74 LV SV Index:   43 LVOT Area:     3.14 cm  RIGHT VENTRICLE RV S prime:     17.10 cm/s TAPSE (M-mode): 2.5 cm LEFT ATRIUM             Index       RIGHT ATRIUM           Index LA diam:        3.40 cm 1.98 cm/m  RA Area:     11.40 cm LA Vol (A2C):   26.9 ml 15.68 ml/m RA Volume:   23.90 ml  13.93 ml/m LA Vol (A4C):   38.5 ml 22.44 ml/m LA Biplane Vol: 33.5 ml 19.53 ml/m  AORTIC VALVE LVOT Vmax:   111.00 cm/s LVOT Vmean:  70.600 cm/s LVOT VTI:    0.234 m MITRAL VALVE MV Area (PHT): 3.12 cm    SHUNTS MV Decel Time: 243 msec    Systemic VTI:  0.23 m MV E velocity: 81.00 cm/s  Systemic Diam: 2.00 cm MV A velocity: 99.80 cm/s MV E/A ratio:  0.81 Eleonore Chiquito MD Electronically signed by Eleonore Chiquito MD Signature Date/Time: 02/14/2020/4:33:15 PM    Final    [place] EKG SITE RITE  Result Date: 02/14/2020 If Site Rite image not attached, placement could not be confirmed due to current cardiac rhythm.   Labs:  CBC: Recent Labs    02/19/20 0428 02/20/20 0414 02/21/20 0403 02/22/20 0346  WBC 20.8* 18.0* 19.5* 18.4*  HGB 17.3* 13.3 12.9 13.4  HCT 52.6*  39.5 38.4 40.4  PLT 654* 548* 324 160    COAGS: Recent Labs    02/14/20 0028  INR 1.1  APTT 40*    BMP: Recent Labs    02/19/20 0428 02/20/20 0414 02/21/20 0403 02/22/20 0346  NA 140 138 140 139  K 3.8 3.4* 3.3* 3.3*  CL 108 109 113* 109  CO2 21* 21* 20* 21*  GLUCOSE 133* 146* 139* 123*  BUN '8 11 10 ' 7*  CALCIUM 9.0 8.3* 8.2* 8.8*  CREATININE 0.86 0.81 0.72 0.68  GFRNONAA >60 >60 >60 >60  GFRAA >60 >60 >60 >60    LIVER FUNCTION TESTS: Recent Labs    02/16/20 1938 02/20/20 0414  BILITOT  --  0.8  AST  --  12*  ALT  --  12  ALKPHOS  --  34*  PROT  --  4.5*  ALBUMIN 3.8 2.9*    TUMOR MARKERS: No results for input(s): AFPTM, CEA, CA199, CHROMGRNA in the last 8760 hours.  Assessment and Plan:  74 y.o, female inpatient. Admitted for headaches found to have a right lateral ventricle ICB, JAK2 mutuation, polycythemia vera and PE . Team is requesting IVC filter placement.  Pertinent Imaging 4.13.21 - CT Chest shows PE  Pertinent IR History none  Pertinent Allergies Erythromycin  WBC is 18.4, potassium 3.63 All other labs and medications are within acceptable parameters.  Patient is afebrile.  Risks and benefits discussed with the patient including, but not limited to bleeding, infection, contrast induced renal failure, filter fracture or migration which can lead to emergency surgery or even death, strut penetration with damage or irritation to adjacent structures and caval thrombosis.  All of the patient's questions were answered, patient is agreeable to proceed.  Consent signed and in IR control room.    Thank you for this interesting consult.  I greatly enjoyed meeting KERLINE TRAHAN and look forward to participating in their care.  A copy of this report was sent to the requesting provider on this date.  Electronically Signed: Avel Peace, NP 02/22/2020, 1:04 PM   I spent a total of 40 Minutes    in face to face in clinical  consultation, greater than 50% of which was counseling/coordinating care for IVC filter placement

## 2020-02-22 NOTE — Progress Notes (Signed)
Patient increasing shortness of breath after ambulating to bathroom this morning.Lung sound clear but diminished oxygen saturations 92 % on room air .Oxygen placed at 2 liters nasal canula for comfort oxygen saturations 95-99%.

## 2020-02-22 NOTE — Plan of Care (Signed)
  Problem: Education: Goal: Knowledge of disease or condition will improve Outcome: Progressing Goal: Knowledge of secondary prevention will improve Outcome: Progressing Goal: Knowledge of patient specific risk factors addressed and post discharge goals established will improve Outcome: Progressing   Problem: Coping: Goal: Will identify appropriate support needs Outcome: Progressing   Problem: Education: Goal: Knowledge of disease or condition will improve Outcome: Progressing Goal: Knowledge of secondary prevention will improve Outcome: Progressing Goal: Knowledge of patient specific risk factors addressed and post discharge goals established will improve Outcome: Progressing Goal: Individualized Educational Video(s) Outcome: Progressing   Problem: Coping: Goal: Will identify appropriate support needs Outcome: Progressing   Problem: Health Behavior/Discharge Planning: Goal: Ability to manage health-related needs will improve Outcome: Progressing

## 2020-02-22 NOTE — Progress Notes (Signed)
STROKE TEAM PROGRESS NOTE   INTERVAL HISTORY RN reports intermittent SOB, including at rest, as well as intermittent tachycardia. Home neb ordered. IM consulted. Found to have B PE. Unable to anticoagulate d/t IVH.  Interventional radiology planning on placing an IVC filter.  Discussed with patient's husband and answered questions.  Patient states her headache is much better Vitals:   02/22/20 1235 02/22/20 1308 02/22/20 1505 02/22/20 1605  BP: (!) 136/56 (!) 148/62 (!) 148/81 (!) 157/85  Pulse: (!) 102 (!) 106 100 (!) 111  Resp: (!) 28 (!) 22 (!) 21 (!) 22  Temp:  97.9 F (36.6 C) 98.5 F (36.9 C)   TempSrc:  Oral Oral   SpO2: 98% 99% 99% 100%  Weight:      Height:       CBC:  Recent Labs  Lab 02/21/20 0403 02/22/20 0346  WBC 19.5* 18.4*  NEUTROABS 17.3* 16.2*  HGB 12.9 13.4  HCT 38.4 40.4  MCV 90.1 90.6  PLT 324 0000000   Basic Metabolic Panel:  Recent Labs  Lab 02/16/20 1938 02/18/20 1147 02/19/20 0428 02/20/20 0414 02/21/20 0403 02/22/20 0346  NA 141   < > 140   < > 140 139  K 3.5   < > 3.8   < > 3.3* 3.3*  CL 111   < > 108   < > 113* 109  CO2 22   < > 21*   < > 20* 21*  GLUCOSE 129*   < > 133*   < > 139* 123*  BUN 13   < > 8   < > 10 7*  CREATININE 0.80   < > 0.86   < > 0.72 0.68  CALCIUM 9.0   < > 9.0   < > 8.2* 8.8*  MG  --   --  1.6*  --  2.2  --   PHOS 2.3*  --   --   --   --   --    < > = values in this interval not displayed.   Lipid Panel:     Component Value Date/Time   CHOL 172 02/14/2020 0959   TRIG 46 02/14/2020 0959   HDL 71 02/14/2020 0959   CHOLHDL 2.4 02/14/2020 0959   VLDL 9 02/14/2020 0959   LDLCALC 92 02/14/2020 0959   HgbA1c:  Lab Results  Component Value Date   HGBA1C 6.1 (H) 02/14/2020    IMAGING past 24 hours DG Chest 2 View  Result Date: 02/22/2020 CLINICAL DATA:  Shortness of breath EXAM: CHEST - 2 VIEW COMPARISON:  February 14, 2020 FINDINGS: There is atelectatic change in the right base. Lungs elsewhere are clear. Heart size  and pulmonary vascularity are normal. No adenopathy. No bone lesions. IMPRESSION: Right base atelectasis. Lungs elsewhere clear. Cardiac silhouette within normal limits. Electronically Signed   By: Lowella Grip III M.D.   On: 02/22/2020 09:20   CT ANGIO CHEST PE W OR WO CONTRAST  Addendum Date: 02/22/2020   ADDENDUM REPORT: 02/22/2020 12:39 ADDENDUM: Critical Value/emergent results were called by telephone at the time of interpretation on 02/22/2020 at 12:32 pm to provider Dr. Leonie Man, who verbally acknowledged these results. Electronically Signed   By: Marijo Conception M.D.   On: 02/22/2020 12:39   Result Date: 02/22/2020 CLINICAL DATA:  Shortness of breath. EXAM: CT ANGIOGRAPHY CHEST WITH CONTRAST TECHNIQUE: Multidetector CT imaging of the chest was performed using the standard protocol during bolus administration of intravenous contrast. Multiplanar CT image reconstructions  and MIPs were obtained to evaluate the vascular anatomy. CONTRAST:  148mL OMNIPAQUE IOHEXOL 350 MG/ML SOLN COMPARISON:  None. FINDINGS: Cardiovascular: Filling defects are noted in the lower lobe branches of both pulmonary arteries consistent with acute pulmonary emboli. Normal cardiac size. No pericardial effusion. Coronary artery calcifications are noted. Atherosclerosis of thoracic aorta is noted without aneurysm formation. Mediastinum/Nodes: No enlarged mediastinal, hilar, or axillary lymph nodes. Thyroid gland, trachea, and esophagus demonstrate no significant findings. Lungs/Pleura: No pneumothorax or pleural effusion is noted. Emphysematous disease is noted in the upper lobes bilaterally. Nodular densities are noted laterally in the right lower lobe most consistent with atypical inflammation or other inflammation. Right middle lobe opacity is noted concerning for atelectasis or possibly inflammation. Upper Abdomen: Probable exophytic cyst seen arising from upper pole of left kidney. Musculoskeletal: No chest wall abnormality. No  acute or significant osseous findings. Review of the MIP images confirms the above findings. IMPRESSION: 1. Acute bilateral lower lobe pulmonary emboli are noted. 2. Coronary artery calcifications are noted suggesting coronary artery disease. 3. Emphysematous disease is noted in the upper lobes bilaterally. 4. Nodular densities are noted laterally in the right lower lobe most consistent with atypical inflammation or other inflammation. Right middle lobe opacity is noted concerning for atelectasis or possibly inflammation. Follow-up unenhanced chest CT in 3-4 weeks is recommended to ensure resolution and rule out neoplasm. Aortic Atherosclerosis (ICD10-I70.0) and Emphysema (ICD10-J43.9). Electronically Signed: By: Marijo Conception M.D. On: 02/22/2020 12:26     PHYSICAL EXAM      General - Well nourished, well developed, pleasant elderly Caucasian lady Ophthalmologic - fundi not visualized due to noncooperation. Respiratory system tachypneic but oxygen sats are maintained.  Lungs are clear to auscultation.  No wheezing or rhonchi Cardiovascular - Regular rhythm and rate.  Mental Status -  She is awake and alert and follows commands appropriately.  At times speech is difficult to understand and calm.  Language including expression, naming, repetition, comprehension was assessed and found intact.  Cranial Nerves II - XII - II - Visual field intact OU. III, IV, VI - Extraocular movements intact. V - Facial sensation intact bilaterally. VII - mild left facial droop. VIII - Hearing & vestibular intact bilaterally. X - Palate elevates symmetrically. XI - Chin turning & shoulder shrug intact bilaterally. XII - Tongue protrusion intact.  Motor Strength - The patient's strength was normal in all extremities and pronator drift was absent.  Bulk was normal and fasciculations were absent.   Motor Tone - Muscle tone was assessed at the neck and appendages and was normal.  Reflexes - The patient's reflexes  were symmetrical in all extremities and she had no pathological reflexes.  Sensory - Light touch, temperature/pinprick were assessed and were symmetrical.    Coordination - The patient had normal movements in the hands with no ataxia or dysmetria.  Tremor was absent.  Gait and Station - deferred.   ASSESSMENT/PLAN Ms. WILLONA DELK is a 74 y.o. female with history of HTN presenting with severe persistent HA accompanied by nausea and vomiting.   IVH - right lateral ventricle IVH secondary to ? CAA vs. HTN Persistent severe headache since admission for the last 5 days with increased lethargy last 2 days.  Repeat brain imaging does not show significant hydrocephalus or increasing hemorrhage  CT head 4/5 0032 R lateral IVH. Severe chronic ischemic microangiopathy.  CT head 4/5 0857 no sign change  MRI Stable IVH, severe leukoaraiosis. Multiple foci throughout progressed from previous  MRI d/t microangiopathy vs amyloid.  MRA unremarkable   Repeat CT head 4/7 stable IVH and ventricle size  Repeat CT head 4/9 - Unchanged size and configuration of right lateral intraventricular hemorrhage since 02/16/2020. Small volume of intraventricular hemorrhage elsewhere has regressed. No ventriculomegaly or transependymal edema. No new intracranial abnormality. Advanced white matter disease, and numerous chronic microhemorrhages throughout the brain demonstrated on recent MRI.  CTA Head - 02/19/2020 - Suboptimal evaluation due to artifact. Patent anterior and posterior circulations. No abnormal vascularity identified.  EEG - This study is suggestive of moderate diffuse encephalopathy, non specific to etiology but could be secondary to toxic-metabolic causes. No seizures or epileptiform discharges were seen throughout the recording.   2D Echo EF 65-70%. No source of embolus   LDL 92   UA - unremarkable except ketones 20 (not diabetic)  HgbA1c 6.1   SCDs for VTE prophylaxis  aspirin 81 mg daily  prior to admission, now on No antithrombotic given IVH and likely CAA  Therapy recommendations:  HH PT, HH OT, HH SLP (arranged)  Disposition:  pending  (held previously d/t ongoing HA, now w/ new PE)  Headache, due to IVH  Depakote 500 mg nightly discontinued  Pt now has Fioricet one Q8 hrs prn as well as tylenol prn  Solumedrol 500 mg IV daily for 3 days started Saturday  Dilaudid - one time dose 02/19/20 per Dr Leonie Man  NS at 100 cc's / hr started 4/10  Therapeutic phlebotomy performed 4/10  Add topamax 25 bid  Start prednisone taper 40-30-20-10-5-5 on 4/13>>  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 Ron Agee   IV fluids NS at 100 cc's per hr.  Dr Leonie Man discussed with Dr Alen Blew on Saturday -> 1 unit of phlebotomy 4/10  Bilateral PE  New SOB and tachycardia  CTA chest - BLE PE. Coronary calcification. BUL emphysema. R sided nodules - RLL inplammation. RLM inflammation vs atx. - need f/u CT in 3-4 wks to ensure resolution and r/o neoplasm.  Unable to get [facility] d/t IVH  For filter placement  Hypertension  Home meds:  losartan 100, amlodipine 5  On losartan 50 bid and amlodipine 10  Add hydralazine 25 tid . SBP goal < 140  Off Cleviprex  BP Stable  . 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 Multiple foci throughout progressed from previous MRI d/t  microangiopathy vs amyloid.  As per husband, pt has some anomia as baseline  BP goal < 140  Avoid antiplatelet or anticoagulation  Hyperlipidemia  Home meds:  lipitor 10  LDL 92  Statin held in setting of acute ICH  Consider continuation of statin at discharge  Hypokalemia and Hypomagnesemia   Potassium 3.3->3.5->2.7->3.8->3.4->3.3 supplement and recheck in AM  Magnesium - 1.6 supplement - 2.2  Magnesium may help HA pain  Other Stroke Risk Factors  Advanced age  Former Cigarette smoker  Overweight, Body mass index is 26.73 kg/m., recommend weight loss, diet and exercise as appropriate   Family hx  stroke (mother, father, and multiple other family members)  Other Active Problems  Hepatitis C  Hypothyroid on synthroid. TSH WNL - resume synthroid   Hospital day # 8 Continue prednisone taper for headache Long Topamax 25 milligrams twice daily.  Plan interventional radiology to place IVC filter.  Patient cannot be started on anticoagulation for pulmonary embolism recent intracerebral hemorrhage.  Long discussion of the bedside with the patient and husband over the phone and answered questions.  Appreciate medical hospitalist consult.  Will ask pulmonary critical care also to consult.  Discussed with Dr. Anselm Pancoast interventional radiology greater than 50% time during this 35-minute visit was spent in counseling and coordination of care and discussion of her intracerebral hemorrhage and headache and answering questions  Asad Keeven,MD   To contact Stroke Continuity provider, please refer to [email or web address] After hours, contact General Neurology

## 2020-02-22 NOTE — Procedures (Signed)
Interventional Radiology Procedure:   Indications: Intracranial hemorrhage with pulmonary emboli.   Procedure: IVC filter placement  Findings: Patent IVC.  [place] filter placed below renal veins.   Complications: None     EBL: less than 10 ml  Plan: Return to inpatient floor.    Breena Bevacqua R. Anselm Pancoast, MD  Pager: [phone]

## 2020-02-22 NOTE — Consult Note (Signed)
Independently examined pt, evaluated data & formulated  care plan with resident    Admitted 4/4 with hypertensive emergency and acute intraventricular hemorrhage within the right lateral ventricle.  Subsequent MRI has shown severe chronic ischemic microangiopathy with microhemorrhages?  Amyloid -reviewed brain imaging studies  Subsequent work-up also showed polycythemia and thrombocytosis, bone marrow biopsy suggesting polycythemia vera with JAK2 mutation , followed by oncology-consultation reviewed  Developed respiratory distress overnight and 2 L of oxygen requirement, CT angiogram chest showed acute bilateral lower lobe emboli, segmental no evidence of RV strain. Personally reviewed images-opacities also noted in right middle and lower lobe  On exam-surprisingly nonfocal, strong in all 4 extremities, no cranial nerve deficits, able to speak in full sentences, no accessory muscle use, clear lungs, S1-S2 normal, soft nontender abdomen.  Echo 4/5 shows normal RV function, no intracardiac source of embolism  Labs reviewed  Impression/plan Acute pulmonary embolism -risk factor likely immobility due to stroke, possibly thrombocytosis contributing -Not a candidate for anticoagulation per neurology -Agree with proceeding with IVC filter placement Obtain venous duplex for completion, doubt repeat echo necessary since no RV strain on CT  Discussed procedure with patient and husband at bedside in detail including future complications of IVC filter without anticoagulation.   Kara Mead MD. Shade Flood. [facility]  If no response to pager , please call [phone]   02/22/2020

## 2020-02-22 NOTE — Consult Note (Signed)
NAME:  [name], MRN:  [id number], DOB:  11-03-1946, LOS: 8 ADMISSION DATE:  02/13/2020, CONSULTATION DATE:  02/22/2020 REFERRING MD:  Dr. Leonie Man CHIEF COMPLAINT:  Headache   Brief History   [name] is a 74 y/o female with a PMHx of hypertension who presented to the ED with c/o headache and admitted for IVH secondary to polycythemia with + JAK mutation, now complicated by bilateral PE.   History of present illness   [name] is a 74 y.o. female with a history of hypertension who presents with headache that started this morning.  She states that she was sitting in front computer and noticed that the headache was at the top of her neck.  Is fairly severe, and has been persistent since onset.  It is primarily occipital in location.  Because of this she sought care in the emergency department, where routine blood work revealed evidence of thrombocytosis.  She had a CT scan given the new onset headache which reveals intraventricular hemorrhage. She denies any antecedent illness, weakness, numbness, visual change.  She does have some nausea and vomiting has been present over the course the day.  Past Medical History   Past Medical History:  Diagnosis Date  . Hepatitis C   . Hyperlipidemia   . Hypertension   . Osteoporosis    Significant Hospital Events   4/05: Admitted to [facility]  Consults:  Neurology, Heme/Onc, IR   Procedures:  4/05: HD Cath placed  4/06: Bone marrow biopsy 4/13: IVC filter placement planned tentatively for today   Significant Diagnostic Tests:  CT Head (4/5):  1. Acute intraventricular hemorrhage within the right lateral ventricle. No definite parenchymal component identified. This is an unusual pattern, but is perhaps less unlikely in the setting of this patient's abnormal coagulation status. 2. Severe chronic ischemic microangiopathy.  CTA Chest (4/13):  IMPRESSION: 1. Acute bilateral lower lobe pulmonary emboli are noted. 2. Coronary artery  calcifications are noted suggesting coronary artery disease. 3. Emphysematous disease is noted in the upper lobes bilaterally. 4. Nodular densities are noted laterally in the right lower lobe most consistent with atypical inflammation or other inflammation. Right middle lobe opacity is noted concerning for atelectasis or possibly inflammation. Follow-up unenhanced chest CT in 3-4 weeks is recommended to ensure resolution and rule out neoplasm.  Micro Data:  RVP (4/5): Negative   Antimicrobials:  None   Interim history/subjective:   Patient developed SOB overnight after ambulating to the bathroom, lowest spO2 was 92%, which resolved with starting 2L supplemental oxygen via [place]. At the time, she denies any chest pain. Today, she states she is feeling a lot better and denies any worsening in breathing at this time.   Objective   Blood pressure (!) 148/62, pulse (!) 106, temperature 97.9 F (36.6 C), temperature source Oral, resp. rate (!) 22, height '5\' 2"'  (1.575 m), weight 66.3 kg, SpO2 99 %.        Intake/Output Summary (Last 24 hours) at 02/22/2020 1336 Last data filed at 02/22/2020 1040 Gross per 24 hour  Intake 2713 ml  Output 700 ml  Net 2013 ml   Filed Weights   02/13/20 1938 02/14/20 1515 02/15/20 0500  Weight: 70.3 kg 72.2 kg 66.3 kg   Physical Exam Vitals and nursing note reviewed.  Constitutional:      General: She is not in acute distress.    Appearance: She is normal weight. She is not ill-appearing.  Cardiovascular:     Rate and Rhythm:  Tachycardia present. Rhythm irregularly irregular.     Heart sounds: No murmur.     Comments: Multiple PVCs noted on telemetry during examination Pulmonary:     Effort: Pulmonary effort is normal. No respiratory distress.     Breath sounds: Normal breath sounds. No wheezing, rhonchi or rales.  Abdominal:     General: Bowel sounds are normal. There is no distension.     Palpations: Abdomen is soft.     Tenderness: There is no  abdominal tenderness.  Musculoskeletal:        General: No tenderness or signs of injury.     Right lower leg: No edema.     Left lower leg: No edema.  Skin:    General: Skin is warm and dry.     Coloration: Skin is not jaundiced.     Findings: No bruising, erythema, lesion or rash.  Neurological:     Mental Status: She is alert. She is disoriented.     Comments: Mildly disoriented to duration of hospitalization. Oriented to self and place  Psychiatric:        Mood and Affect: Mood normal.        Behavior: Behavior normal.     Resolved Hospital Problem list     Assessment & Plan:   # Bilateral pulmonary embolism  - Likely secondary to immobilization compounded by Polycythemia Vera, which increases risk for thrombosis  - No evidence of right heart strain on imaging or examination  - No respiratory distress at this time. Stable on 2 L supplemental oxygen  - Anti-coagulation contraindicated due to recent intraventricular hemorrhage - IVC filter placement by IR planned for today - Continue supplemental oxygen - Wean oxygen as tolerated   Best practice:  Diet: NPO Pain/Anxiety/Delirium protocol (if indicated): N/A VAP protocol (if indicated): N/A DVT prophylaxis: Contraindicated due to IVH GI prophylaxis: N/A Glucose control: N/A Mobility: Bedrest Code Status: FULL Family Communication: Husband updated at bedside  Disposition: Pending further stabilization   Labs   CBC: Recent Labs  Lab 02/18/20 0332 02/19/20 0428 02/20/20 0414 02/21/20 0403 02/22/20 0346  WBC 13.9* 20.8* 18.0* 19.5* 18.4*  NEUTROABS 10.7* 17.7* 15.5* 17.3* 16.2*  HGB 16.1* 17.3* 13.3 12.9 13.4  HCT 49.4* 52.6* 39.5 38.4 40.4  MCV 90.0 91.3 90.6 90.1 90.6  PLT 721* 654* 548* 324 539    Basic Metabolic Panel: Recent Labs  Lab 02/16/20 1938 02/16/20 1938 02/18/20 1147 02/19/20 0428 02/20/20 0414 02/21/20 0403 02/22/20 0346  NA 141   < > 140 140 138 140 139  K 3.5   < > 2.7* 3.8 3.4*  3.3* 3.3*  CL 111   < > 106 108 109 113* 109  CO2 22   < > 24 21* 21* 20* 21*  GLUCOSE 129*   < > 132* 133* 146* 139* 123*  BUN 13   < > '8 8 11 10 ' 7*  CREATININE 0.80   < > 0.71 0.86 0.81 0.72 0.68  CALCIUM 9.0   < > 9.0 9.0 8.3* 8.2* 8.8*  MG  --   --   --  1.6*  --  2.2  --   PHOS 2.3*  --   --   --   --   --   --    < > = values in this interval not displayed.   GFR: Estimated Creatinine Clearance: 56 mL/min (by C-G formula based on SCr of 0.68 mg/dL). Recent Labs  Lab 02/19/20 0428 02/20/20 0414 02/21/20 0403  02/22/20 0346  WBC 20.8* 18.0* 19.5* 18.4*    Liver Function Tests: Recent Labs  Lab 02/16/20 1938 02/20/20 0414  AST  --  12*  ALT  --  12  ALKPHOS  --  34*  BILITOT  --  0.8  PROT  --  4.5*  ALBUMIN 3.8 2.9*   No results for input(s): LIPASE, AMYLASE in the last 168 hours. No results for input(s): AMMONIA in the last 168 hours.  ABG    Component Value Date/Time   TCO2 27 02/16/2020 1329     Coagulation Profile: No results for input(s): INR, PROTIME in the last 168 hours.  Cardiac Enzymes: No results for input(s): CKTOTAL, CKMB, CKMBINDEX, TROPONINI in the last 168 hours.  HbA1C: Hgb A1c MFr Bld  Date/Time Value Ref Range Status  02/14/2020 09:59 AM 6.1 (H) 4.8 - 5.6 % Final    Comment:    (NOTE) Pre diabetes:          5.7%-6.4% Diabetes:              >6.4% Glycemic control for   <7.0% adults with diabetes   08/16/2014 07:34 AM 5.9 (H) <5.7 % Final    Comment:    (NOTE)                                                                       According to the ADA Clinical Practice Recommendations for 2011, when HbA1c is used as a screening test:  >=6.5%   Diagnostic of Diabetes Mellitus           (if abnormal result is confirmed) 5.7-6.4%   Increased risk of developing Diabetes Mellitus References:Diagnosis and Classification of Diabetes Mellitus,Diabetes YKZL,9357,01(XBLTJ 1):S62-S69 and Standards of Medical Care in         Diabetes -  2011,Diabetes QZES,9233,00 (Suppl 1):S11-S61.    CBG: Recent Labs  Lab 02/20/20 0332  GLUCAP 146*    Review of Systems:   Negative except as noted above.   Past Medical History  She,  has a past medical history of Hepatitis C, Hyperlipidemia, Hypertension, and Osteoporosis.   Surgical History   History reviewed. No pertinent surgical history.   Social History   reports that she has quit smoking. She has never used smokeless tobacco. She reports that she does not drink alcohol or use drugs.   Family History   Her family history is not on file.   Allergies Allergies  Allergen Reactions  . Erythromycin Itching and Other (See Comments)    Severe stomach pains, diarrhea     Home Medications  Prior to Admission medications   Medication Sig Start Date End Date Taking? Authorizing Provider  ALPRAZolam Duanne Moron) 0.5 MG tablet Take 0.25 mg by mouth daily as needed for anxiety.    Yes [provider]  amLODipine (NORVASC) 5 MG tablet Take 5 mg by mouth daily. 01/26/20  Yes [provider]  aspirin 81 MG tablet Take 1 tablet (81 mg total) by mouth daily. 08/17/14  Yes Rai, Ripudeep K, MD  atorvastatin (LIPITOR) 10 MG tablet Take 10 mg by mouth every evening.    Yes [provider]  Cholecalciferol (VITAMIN D3) 2000 UNITS TABS Take 2,000 Units by mouth every evening.  Yes [provider]  levothyroxine (SYNTHROID, LEVOTHROID) 125 MCG tablet Take 125 mcg by mouth daily before breakfast.   Yes [provider]  losartan (COZAAR) 100 MG tablet Take 100 mg by mouth every evening.   Yes [provider]  Probiotic Product (ALIGN PO) Take 1 capsule by mouth daily.   Yes [provider]  SYMBICORT 160-4.5 MCG/ACT inhaler Inhale 1 puff into the lungs daily as needed (shortness of breath).  12/03/19  Yes [provider]  TRELEGY ELLIPTA 100-62.5-25 MCG/INH AEPB Inhale 1 puff into the lungs daily as needed (shortness of breath).   02/03/20  Yes [provider]  allopurinol (ZYLOPRIM) 300 MG tablet Take 1 tablet (300 mg total) by mouth daily. 02/18/20   Magrinat, Virgie Dad, MD  hydroxyurea (HYDREA) 500 MG capsule Take 1 capsule (500 mg total) by mouth daily. May take with food to minimize GI side effects. 02/18/20   Magrinat, Virgie Dad, MD    Dr. Jose Persia Internal Medicine PGY-1  Pager: [phone] 02/22/2020, 1:36 PM

## 2020-02-22 NOTE — Consult Note (Addendum)
[facility] Medical Consultation  [name] [id number] DOB: [birth year] DOA: 02/13/2020 PCP: Cari Caraway, MD   Requesting physician: Antony Contras, MD Date of consultation: 02/22/2020 Reason for consultation: Shortness of breath  Impression/Recommendations Principal Problem:   IVH (intraventricular hemorrhage) ([place]) Active Problems:   Hyperlipidemia   Hypertension   Hepatitis C   Headache   Hypothyroidism   Polycythemia vera ([place])   Likely Cerebral amyloid angiopathy ([place])      1. Interventricular hemorrhage: Patient presented with complaints of headache.  Found to have a right lateral intraventricular hemorrhage.  Questioning amyloid angiopathy versus hypertension. -Per primary  2. Acute respiratory distress addendum: Secondary to pulmonary embolism: Patient noted to be acutely short of breath with tachycardia and tachypnea. Chest x-ray noted right basilar atelectasis, but otherwise were noted to be clear.  Given patient has been unable to be on anticoagulation given recent intraventricular hemorrhage as well as polycythemia vera.  Question of possibility of a pulmonary embolus.  -Continuous pulse oximetry with nasal cannula oxygen as needed  -Check stat CT angiogram of the chest to rule out pulmonary embolus Addendum: Bilateral PEs found on CT angiogram of the chest without note of right heart strain.  Checking stat Doppler ultrasound of lower extremities and have consulted Dr. Markus Daft of IR.  Given patient's history she would be a likely candidate for IVC filter placement.  3. Polycythemia vera: Oncology on board.  Found to be JAK2 positive for [id number] mutation -Per oncology  All other problems appear to be relatively stable  I will followup again tomorrow. Please contact me if I can be of assistance in the meanwhile. Thank you for this consultation.  Chief Complaint: Shortness of breath  HPI:  [name] is a 74 y.o. female with medical history  significant of hypertension presented to the ED initially on 4/4 with complaints of headache.  She was admitted after CT scan of the head without contrast revealed an acute right interventricular hemorrhage. Patient was admitted to the neurology service at that time.  Upon further evaluation was diagnosed with polycythemia by oncology and was JAK2 positive for theV617F mutation.  Oncology currently recommended for pheresis Hydrea, and therapeutic phlebotomy.  This morning patient noted to be acutely short of breath tachycardic.  TRH called to evaluate for patient's shortness of breath.  She denied having any chest pain, calf swelling, or leg pain.  Patient had been placed on nasal cannula oxygen with some improvement in symptoms.   Review of Systems  Respiratory: Positive for shortness of breath.   Cardiovascular: Negative for chest pain and leg swelling.     Past Medical History:  Diagnosis Date  . Hepatitis C   . Hyperlipidemia   . Hypertension   . Osteoporosis    History reviewed. No pertinent surgical history. Social History:  reports that she has quit smoking. She has never used smokeless tobacco. She reports that she does not drink alcohol or use drugs.  Allergies  Allergen Reactions  . Erythromycin Itching and Other (See Comments)    Severe stomach pains, diarrhea   History reviewed. No pertinent family history.  Prior to Admission medications   Medication Sig Start Date End Date Taking? Authorizing Provider  ALPRAZolam Duanne Moron) 0.5 MG tablet Take 0.25 mg by mouth daily as needed for anxiety.    Yes [provider]  amLODipine (NORVASC) 5 MG tablet Take 5 mg by mouth daily. 01/26/20  Yes [provider]  aspirin 81 MG tablet Take 1 tablet (  81 mg total) by mouth daily. 08/17/14  Yes Rai, Ripudeep K, MD  atorvastatin (LIPITOR) 10 MG tablet Take 10 mg by mouth every evening.    Yes [provider]  Cholecalciferol (VITAMIN D3) 2000 UNITS TABS Take 2,000 Units  by mouth every evening.   Yes [provider]  levothyroxine (SYNTHROID, LEVOTHROID) 125 MCG tablet Take 125 mcg by mouth daily before breakfast.   Yes [provider]  losartan (COZAAR) 100 MG tablet Take 100 mg by mouth every evening.   Yes [provider]  Probiotic Product (ALIGN PO) Take 1 capsule by mouth daily.   Yes [provider]  SYMBICORT 160-4.5 MCG/ACT inhaler Inhale 1 puff into the lungs daily as needed (shortness of breath).  12/03/19  Yes [provider]  TRELEGY ELLIPTA 100-62.5-25 MCG/INH AEPB Inhale 1 puff into the lungs daily as needed (shortness of breath).  02/03/20  Yes [provider]  allopurinol (ZYLOPRIM) 300 MG tablet Take 1 tablet (300 mg total) by mouth daily. 02/18/20   Magrinat, Virgie Dad, MD  hydroxyurea (HYDREA) 500 MG capsule Take 1 capsule (500 mg total) by mouth daily. May take with food to minimize GI side effects. 02/18/20   Magrinat, Virgie Dad, MD   Physical Exam:  Constitutional: Elderly female currently in NAD, calm, comfortable Vitals:   02/22/20 0749 02/22/20 0753 02/22/20 0831 02/22/20 1106  BP:   132/67 (!) 158/86  Pulse:    (!) 111  Resp: 18   20  Temp:    98.1 F (36.7 C)  TempSrc:    Oral  SpO2: 94% 97%  99%  Weight:      Height:       Eyes: PERRL, lids and conjunctivae normal ENMT: Mucous membranes are moist. Posterior pharynx clear of any exudate or lesions.  Neck: normal, supple, no masses, no thyromegaly Respiratory: clear to auscultation bilaterally, no wheezing, no crackles. Normal respiratory effort. No accessory muscle use.  Cardiovascular: Tachycardic, no murmurs / rubs / gallops. No extremity edema. 2+ pedal pulses. No carotid bruits.  Abdomen: no tenderness, no masses palpated. No hepatosplenomegaly. Bowel sounds positive.  Musculoskeletal: no clubbing / cyanosis. No joint deformity upper and lower extremities. Good ROM, no contractures. Normal muscle tone.  Skin: no rashes,  lesions, ulcers. No induration Neurologic: CN 2-12 grossly intact. Sensation intact, DTR normal. Strength 5/5 in all 4.  Psychiatric: Normal judgment and insight. Alert and oriented x 3. Normal mood.   Labs on Admission:  Basic Metabolic Panel: Recent Labs  Lab 02/16/20 1938 02/16/20 1938 02/18/20 1147 02/19/20 0428 02/20/20 0414 02/21/20 0403 02/22/20 0346  NA 141   < > 140 140 138 140 139  K 3.5   < > 2.7* 3.8 3.4* 3.3* 3.3*  CL 111   < > 106 108 109 113* 109  CO2 22   < > 24 21* 21* 20* 21*  GLUCOSE 129*   < > 132* 133* 146* 139* 123*  BUN 13   < > 8 8 11 10  7*  CREATININE 0.80   < > 0.71 0.86 0.81 0.72 0.68  CALCIUM 9.0   < > 9.0 9.0 8.3* 8.2* 8.8*  MG  --   --   --  1.6*  --  2.2  --   PHOS 2.3*  --   --   --   --   --   --    < > = values in this interval not displayed.   Liver Function Tests:  Recent Labs  Lab 02/16/20 1938 02/20/20 0414  AST  --  12*  ALT  --  12  ALKPHOS  --  34*  BILITOT  --  0.8  PROT  --  4.5*  ALBUMIN 3.8 2.9*   No results for input(s): LIPASE, AMYLASE in the last 168 hours. No results for input(s): AMMONIA in the last 168 hours. CBC: Recent Labs  Lab 02/18/20 0332 02/19/20 0428 02/20/20 0414 02/21/20 0403 02/22/20 0346  WBC 13.9* 20.8* 18.0* 19.5* 18.4*  NEUTROABS 10.7* 17.7* 15.5* 17.3* 16.2*  HGB 16.1* 17.3* 13.3 12.9 13.4  HCT 49.4* 52.6* 39.5 38.4 40.4  MCV 90.0 91.3 90.6 90.1 90.6  PLT 721* 654* 548* 324 160   Cardiac Enzymes: No results for input(s): CKTOTAL, CKMB, CKMBINDEX, TROPONINI in the last 168 hours. BNP: Invalid input(s): POCBNP CBG: Recent Labs  Lab 02/20/20 0332  GLUCAP 146*    Radiological Exams on Admission: DG Chest 2 View  Result Date: 02/22/2020 CLINICAL DATA:  Shortness of breath EXAM: CHEST - 2 VIEW COMPARISON:  February 14, 2020 FINDINGS: There is atelectatic change in the right base. Lungs elsewhere are clear. Heart size and pulmonary vascularity are normal. No adenopathy. No bone lesions.  IMPRESSION: Right base atelectasis. Lungs elsewhere clear. Cardiac silhouette within normal limits. Electronically Signed   By: Lowella Grip III M.D.   On: 02/22/2020 09:20    EKG: Independently reviewed.  Sinus arrhythmia at 80 bpm  Time spent: >45 minutes  [facility] Pager [phone]  If 7PM-7AM, please contact night-coverage [email or web address] Password [facility] 02/22/2020, 11:22 AM

## 2020-02-23 ENCOUNTER — Encounter (HOSPITAL_COMMUNITY): Payer: Self-pay | Admitting: Neurology

## 2020-02-23 ENCOUNTER — Encounter (HOSPITAL_COMMUNITY): Payer: Self-pay | Admitting: Oncology

## 2020-02-23 ENCOUNTER — Inpatient Hospital Stay (HOSPITAL_COMMUNITY): Payer: Medicare HMO

## 2020-02-23 DIAGNOSIS — I2694 Multiple subsegmental pulmonary emboli without acute cor pulmonale: Secondary | ICD-10-CM

## 2020-02-23 DIAGNOSIS — I2699 Other pulmonary embolism without acute cor pulmonale: Secondary | ICD-10-CM

## 2020-02-23 DIAGNOSIS — I10 Essential (primary) hypertension: Secondary | ICD-10-CM

## 2020-02-23 DIAGNOSIS — I615 Nontraumatic intracerebral hemorrhage, intraventricular: Secondary | ICD-10-CM | POA: Diagnosis not present

## 2020-02-23 DIAGNOSIS — I4892 Unspecified atrial flutter: Secondary | ICD-10-CM

## 2020-02-23 MED ORDER — AMIODARONE HCL IN DEXTROSE 360-4.14 MG/200ML-% IV SOLN
30.0000 mg/h | INTRAVENOUS | Status: DC
  Filled 2020-02-23: qty 200

## 2020-02-23 MED ORDER — DILTIAZEM HCL-DEXTROSE 125-5 MG/125ML-% IV SOLN (PREMIX)
5.0000 mg/h | INTRAVENOUS | Status: DC
  Filled 2020-02-23 (×2): qty 125

## 2020-02-23 MED ORDER — ADENOSINE 6 MG/2ML IV SOLN
6.0000 mg | Freq: Once | INTRAVENOUS | Status: AC
  Administered 2020-02-23: 6 mg via INTRAVENOUS
  Filled 2020-02-23: qty 2

## 2020-02-23 MED ORDER — DILTIAZEM LOAD VIA INFUSION
15.0000 mg | Freq: Once | INTRAVENOUS | Status: DC
  Filled 2020-02-23: qty 15

## 2020-02-23 MED ORDER — ADENOSINE 12 MG/4ML IV SOLN
12.0000 mg | INTRAVENOUS | Status: AC
  Filled 2020-02-23: qty 4

## 2020-02-23 MED ORDER — AMIODARONE HCL IN DEXTROSE 360-4.14 MG/200ML-% IV SOLN
60.0000 mg/h | INTRAVENOUS | Status: DC
  Administered 2020-02-23 – 2020-02-24 (×4): 60 mg/h via INTRAVENOUS
  Filled 2020-02-23 (×4): qty 200

## 2020-02-23 MED ORDER — METOPROLOL TARTRATE 5 MG/5ML IV SOLN
5.0000 mg | Freq: Once | INTRAVENOUS | Status: AC
  Administered 2020-02-23: 5 mg via INTRAVENOUS

## 2020-02-23 MED ORDER — METOPROLOL TARTRATE 5 MG/5ML IV SOLN
INTRAVENOUS | Status: AC
  Filled 2020-02-23: qty 5

## 2020-02-23 MED ORDER — AMIODARONE LOAD VIA INFUSION
150.0000 mg | Freq: Once | INTRAVENOUS | Status: AC
  Administered 2020-02-23: 150 mg via INTRAVENOUS
  Filled 2020-02-23: qty 83.34

## 2020-02-23 MED ORDER — FLUTICASONE FUROATE-VILANTEROL 200-25 MCG/INH IN AEPB
1.0000 | INHALATION_SPRAY | Freq: Every day | RESPIRATORY_TRACT | Status: DC
  Administered 2020-02-25 – 2020-03-10 (×3): 1 via RESPIRATORY_TRACT
  Filled 2020-02-23 (×2): qty 28

## 2020-02-23 MED ORDER — CHLORHEXIDINE GLUCONATE CLOTH 2 % EX PADS
6.0000 | MEDICATED_PAD | Freq: Every day | CUTANEOUS | Status: DC
  Administered 2020-02-24 – 2020-02-25 (×2): 6 via TOPICAL

## 2020-02-23 MED ORDER — AMIODARONE HCL IN DEXTROSE 360-4.14 MG/200ML-% IV SOLN
60.0000 mg/h | INTRAVENOUS | Status: DC
  Filled 2020-02-23: qty 200

## 2020-02-23 NOTE — Progress Notes (Addendum)
STROKE TEAM PROGRESS NOTE   INTERVAL HISTORY SOB has improved; IVC filter was placed yesterday. No h/a or pain today. HR has been quite tachycardic. After morning rounds, the RN paged to say that pt has been in Afib RVR 160s. Diltiazem gtt started.  No family at bedside during rounds  Vitals:   02/23/20 0500 02/23/20 0608 02/23/20 1208 02/23/20 1458  BP: (!) 141/70 (!) 143/67 137/66 133/88  Pulse: 69 70 99 (!) 168  Resp: 19 20 (!) 21 20  Temp: 98.3 F (36.8 C)  98.7 F (37.1 C) 98.6 F (37 C)  TempSrc: Oral  Oral Oral  SpO2: 97% 96% 98%   Weight:      Height:       CBC:  Recent Labs  Lab 02/21/20 0403 02/22/20 0346  WBC 19.5* 18.4*  NEUTROABS 17.3* 16.2*  HGB 12.9 13.4  HCT 38.4 40.4  MCV 90.1 90.6  PLT 324 0000000   Basic Metabolic Panel:  Recent Labs  Lab 02/16/20 1938 02/18/20 1147 02/19/20 0428 02/20/20 0414 02/21/20 0403 02/22/20 0346  NA 141   < > 140   < > 140 139  K 3.5   < > 3.8   < > 3.3* 3.3*  CL 111   < > 108   < > 113* 109  CO2 22   < > 21*   < > 20* 21*  GLUCOSE 129*   < > 133*   < > 139* 123*  BUN 13   < > 8   < > 10 7*  CREATININE 0.80   < > 0.86   < > 0.72 0.68  CALCIUM 9.0   < > 9.0   < > 8.2* 8.8*  MG  --   --  1.6*  --  2.2  --   PHOS 2.3*  --   --   --   --   --    < > = values in this interval not displayed.   Lipid Panel:     Component Value Date/Time   CHOL 172 02/14/2020 0959   TRIG 46 02/14/2020 0959   HDL 71 02/14/2020 0959   CHOLHDL 2.4 02/14/2020 0959   VLDL 9 02/14/2020 0959   LDLCALC 92 02/14/2020 0959   HgbA1c:  Lab Results  Component Value Date   HGBA1C 6.1 (H) 02/14/2020    IMAGING past 24 hours IR IVC FILTER PLMT / S&I /IMG GUID/MOD SED  Result Date: 02/22/2020 INDICATION: 74 year old with intraventricular hemorrhage, polycythemia vera and bilateral pulmonary emboli. Patient is not a candidate for anticoagulation due to the intraventricular hemorrhage. Request for IVC filter placement. EXAM: IVC FILTER PLACEMENT;  IVC VENOGRAM; ULTRASOUND FOR VASCULAR ACCESS Physician: Stephan Minister. Anselm Pancoast, MD MEDICATIONS: None. ANESTHESIA/SEDATION: Fentanyl 0.5 mcg IV; Versed 50 mg IV Moderate Sedation Time:  24 minutes minutes The patient was continuously monitored during the procedure by the interventional radiology nurse under my direct supervision. CONTRAST:  50 mL Omnipaque 300 FLUOROSCOPY TIME:  Fluoroscopy Time: 4 minutes, 18 seconds, 90 mGy COMPLICATIONS: None immediate. PROCEDURE: Informed consent was obtained for an IVC venogram and filter placement. Ultrasound demonstrated a patent right internal jugular vein. Ultrasound image obtained for documentation. The right side of the neck was prepped and draped in a sterile fashion. Maximal barrier sterile technique was utilized including caps, mask, sterile gowns, sterile gloves, sterile drape, hand hygiene and skin antiseptic. The skin was anesthetized with 1% lidocaine. A 21 gauge needle was directed into the vein with ultrasound guidance  and a micropuncture dilator set was placed. A wire was advanced into the IVC. The filter sheath was advanced over the wire into the IVC. An IVC venogram was performed. Five French catheter was used to cannulate the right renal vein with a Bentson wire. Fluoroscopic images were obtained for documentation. A Bard Denali filter was deployed below the lowest renal vein. A follow-up venogram was performed and the vascular sheath was removed with manual compression. FINDINGS: IVC was patent. Bilateral renal veins were identified. The filter was deployed below the lowest renal vein. Follow-up venogram confirmed placement within the IVC and below the renal veins. IMPRESSION: Successful placement of a retrievable IVC filter. PLAN: This IVC filter is potentially retrievable. The patient will be assessed for filter retrieval by Interventional Radiology in approximately 8-12 weeks. Further recommendations regarding filter retrieval, continued surveillance or declaration  of device permanence, will be made at that time. Electronically Signed   By: Markus Daft M.D.   On: 02/22/2020 17:50   VAS [place] LOWER EXTREMITY VENOUS (DVT)  Result Date: 02/23/2020  Lower Venous DVTStudy Indications: Pulmonary embolism.  Limitations: Patient positioning. Comparison Study: no prior Performing Technologist: [name] RVS  Examination Guidelines: A complete evaluation includes B-mode imaging, spectral Doppler, color Doppler, and power Doppler as needed of all accessible portions of each vessel. Bilateral testing is considered an integral part of a complete examination. Limited examinations for reoccurring indications may be performed as noted. The reflux portion of the exam is performed with the patient in reverse Trendelenburg.  +---------+---------------+---------+-----------+----------+--------------+ RIGHT    CompressibilityPhasicitySpontaneityPropertiesThrombus Aging +---------+---------------+---------+-----------+----------+--------------+ CFV      Full           Yes      Yes                                 +---------+---------------+---------+-----------+----------+--------------+ SFJ      Full                                                        +---------+---------------+---------+-----------+----------+--------------+ FV Prox  Full                                                        +---------+---------------+---------+-----------+----------+--------------+ FV Mid   Full                                                        +---------+---------------+---------+-----------+----------+--------------+ FV DistalFull                                                        +---------+---------------+---------+-----------+----------+--------------+ PFV      Full                                                        +---------+---------------+---------+-----------+----------+--------------+  POP      Full           Yes      Yes                                  +---------+---------------+---------+-----------+----------+--------------+ PTV      Full                                                        +---------+---------------+---------+-----------+----------+--------------+ PERO     Full                                                        +---------+---------------+---------+-----------+----------+--------------+   +---------+---------------+---------+-----------+----------+--------------+ LEFT     CompressibilityPhasicitySpontaneityPropertiesThrombus Aging +---------+---------------+---------+-----------+----------+--------------+ CFV      Full           Yes      Yes                                 +---------+---------------+---------+-----------+----------+--------------+ SFJ      Full                                                        +---------+---------------+---------+-----------+----------+--------------+ FV Prox  Full                                                        +---------+---------------+---------+-----------+----------+--------------+ FV Mid   Full                                                        +---------+---------------+---------+-----------+----------+--------------+ FV DistalFull                                                        +---------+---------------+---------+-----------+----------+--------------+ PFV      Full                                                        +---------+---------------+---------+-----------+----------+--------------+ POP      Full           Yes      Yes                                 +---------+---------------+---------+-----------+----------+--------------+  PTV      Full                                                        +---------+---------------+---------+-----------+----------+--------------+ PERO     Full                                                         +---------+---------------+---------+-----------+----------+--------------+     Summary: BILATERAL: - No evidence of deep vein thrombosis seen in the lower extremities, bilaterally.   *See table(s) above for measurements and observations.    Preliminary      PHYSICAL EXAM      General - Well nourished, well developed, no acute distress Ophthalmologic - fundi not visualized due to noncooperation. Respiratory system tachypneic but oxygen sats are maintained.  Lungs are clear to auscultation.  No wheezing or rhonchi Cardiovascular - Regular rhythm and rate.  Mental Status -  She is awake and alert and follows commands appropriately. Speech is fluent.  Language including expression, naming, repetition, comprehension was assessed and found intact.  Cranial Nerves II - XII - II - Visual field intact OU. III, IV, VI - Extraocular movements intact. V - Facial sensation intact bilaterally. VII - mild left facial droop. VIII - Hearing & vestibular intact bilaterally. X - Palate elevates symmetrically. XI - Chin turning & shoulder shrug intact bilaterally. XII - Tongue protrusion intact.  Motor Strength - The patient's strength was normal in all extremities and pronator drift was absent.  Bulk was normal and fasciculations were absent.   Motor Tone - Muscle tone was assessed at the neck and appendages and was normal.  Reflexes - The patient's reflexes were symmetrical in all extremities and she had no pathological reflexes.  Sensory - Light touch, temperature/pinprick were assessed and were symmetrical.    Coordination - The patient had normal movements in the hands with no ataxia or dysmetria.  Tremor was absent.  Gait and Station - deferred.   ASSESSMENT/PLAN [name] is a 74 y.o. female with history of HTN presenting with severe persistent HA accompanied by nausea and vomiting. This has now improved.   IVH - right lateral ventricle IVH secondary to ? CAA vs. HTN Persistent  severe headache since admission for 5 days. Lethargy has now improved.  Repeat brain imaging does not show significant hydrocephalus or increasing hemorrhage  CT head 4/5 0032 R lateral IVH. Severe chronic ischemic microangiopathy.  CT head 4/5 0857 no sign change  MRI Stable IVH, severe leukoaraiosis. Multiple foci throughout progressed from previous MRI d/t microangiopathy vs amyloid.  MRA unremarkable   Repeat CT head 4/7 stable IVH and ventricle size  Repeat CT head 4/9 - Unchanged size and configuration of right lateral intraventricular hemorrhage since 02/16/2020. Small volume of intraventricular hemorrhage elsewhere has regressed. No ventriculomegaly or transependymal edema. No new intracranial abnormality. Advanced white matter disease, and numerous chronic microhemorrhages throughout the brain demonstrated on recent MRI.  CTA Head - 02/19/2020 - Suboptimal evaluation due to artifact. Patent anterior and posterior circulations. No abnormal vascularity identified.  EEG - This study is suggestive of moderate diffuse encephalopathy, non specific to  etiology but could be secondary to toxic-metabolic causes. No seizures or epileptiform discharges were seen throughout the recording.   2D Echo EF 65-70%. No source of embolus   LDL 92   UA - unremarkable except ketones 20 (not diabetic)  HgbA1c 6.1   SCDs for VTE prophylaxis  aspirin 81 mg daily prior to admission, now on No antithrombotic given IVH and likely CAA  Therapy recommendations:  HH PT, HH OT, HH SLP (arranged)  Disposition:  pending  (held d/t new PE and now Afib RVR)  Headache, due to IVH  Depakote 500 mg nightly discontinued  Pt now has Fioricet one Q8 hrs prn as well as tylenol prn  Solumedrol 500 mg IV daily for 3 days started Saturday  Dilaudid - one time dose 02/19/20 per Dr Leonie Man  NS at 100 cc's / hr started 4/10  Therapeutic phlebotomy performed 4/10  Add topamax 25 bid  Start prednisone taper  40-30-20-10-5-5 on 4/13>>  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 Ron Agee   IV fluids NS at 100 cc's per hr.  Dr Leonie Man discussed with Dr Alen Blew on Saturday -> 1 unit of phlebotomy 4/10  Bilateral PE  New SOB and tachycardia  CTA chest - BLE PE. Coronary calcification. BUL emphysema. R sided nodules - RLL inplammation. RLM inflammation vs atx. - need f/u CT in 3-4 wks to ensure resolution and r/o neoplasm.  Unable to get [facility] d/t IVH  Filter placement on 4/13  Afib RVR in 160's - Diltizem gtt started - Unable to anticoagulate d/t ICH  Hypertension  Home meds:  losartan 100, amlodipine 5  On losartan 50 bid and amlodipine 10  Add hydralazine 25 tid . SBP goal < 140  Off Cleviprex  BP Stable  . 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 Multiple foci throughout progressed from previous MRI d/t microangiopathy vs amyloid.  As per husband, pt has some anomia as baseline  BP goal < 140  Avoid antiplatelet or anticoagulation  Hyperlipidemia  Home meds:  lipitor 10  LDL 92  Statin held in setting of acute ICH  Consider continuation of statin at discharge  Hypokalemia and Hypomagnesemia   Potassium 3.3->3.5->2.7->3.8->3.4->3.3 supplement and recheck in AM  Magnesium - 1.6 supplement - 2.2  Magnesium may help HA pain  Other Stroke Risk  Factors  Advanced age  Former Cigarette smoker  Overweight, Body mass index is 26.73 kg/m., recommend weight loss, diet and exercise as appropriate   Family hx stroke (mother, father, and multiple other family members)  Other Active Problems  Hepatitis C  Hypothyroid on synthroid. TSH WNL - resume synthroid   Hospital day # 9 Pt continues to have significant tachycardia and now Afib RVR. Diltizem gtt just started. Continue prednisone taper for headache and Topamax 25 milligrams twice daily.  She had interventional radiology place IVC filter.  Patient cannot be started on anticoagulation for pulmonary embolism nor Afib d/t intracerebral hemorrhage. Appreciate medical hospitalist consult.  Will ask pulmonary critical  care to help address tachycardia and IV drips.  Need to consider transferring to ICU . Desiree Metzger-Cihelka, ARNP-C, ANVP-BC Pager: [phone]  I have personally obtained history,examined this patient, reviewed notes, independently viewed imaging studies, participated in medical decision making and plan of care.ROS completed by me personally and pertinent positives fully documented  I have made any additions or clarifications directly to the above note. Agree with note above. This patient is critically ill and at significant risk of neurological worsening, death and care requires constant monitoring of vital signs, hemodynamics,respiratory and cardiac monitoring, extensive review of multiple databases, frequent neurological assessment, discussion with family, other specialists and medical decision making of high complexity.I have made any additions or clarifications directly to the above note.This critical care time does not reflect procedure time, or teaching time or supervisory time of PA/NP/Med Resident etc but could involve care discussion time.  I spent 30 minutes of neurocritical care time  in the care of  this patient.     Antony Contras, MD Medical Director [facility] Pager: [phone] 02/23/2020 4:09 PM   To contact Stroke Continuity provider, please refer to [email or web address] After hours, contact General Neurology

## 2020-02-23 NOTE — Consult Note (Addendum)
Cardiology Consultation:   Patient ID: [name]; [id number]; [birth year]   Admit date: 02/13/2020 Date of Consult: 02/23/2020  Primary Care Provider: Cari Caraway, MD Primary Cardiologist: Quay Burow, MD new Primary Electrophysiologist:  None   Patient Profile:   [name] is a 74 y.o. female with a hx of hypertension, hyperlipidemia history of cardiac issues or cardiac evaluation, who was admitted 02/14/2020 intraventricular hemorrhage in the setting of severe thrombocytosis, platelet count initially 990,000, hemoglobin initially 17.7, who is being seen today for the evaluation of rapid atrial fib/flutter at the request of Dr. Elsworth Soho.  History of Present Illness:   [name] was in sinus rhythm when first admitted, but with a marked sinus arrhythmia.  She was seen by Dr. Jana Hakim who recommended catheter placement, leukapheresis, and a bone marrow biopsy to rule out polycythemia vera.  He also recommended ruling out acquired von Willebrand disease>> negative.  There was also concern for amyloid angiopathy.  The bone marrow biopsy was consistent with polycythemia vera.  JAK2 was positive for the V6 17 F mutation  She was started on plasmapheresis and Hydrea.  She had phlebotomy as well.  Platelet count was trending down.  She was progressing, and the plan was for discharge soon with home health PT and OT.  Continue to have headaches.  However, on 4/13, she was noted to have increasing shortness of breath.  He was tachycardic and tachypneic.  A CT a of the chest showed bilateral PE.  However, because of the intraventricular bleed, she could not be anticoagulated.  Therefore, an IVC filter was inserted on 4/13.  An echocardiogram had been performed on admission which showed no right heart strain.  EF normal.  Lower extremity Dopplers were negative for DVT.  She remained tachycardic, and had frequent ventricular and atrial ectopy.  She also had some short runs of nonsustained  VT.  On 4/14 at approximately 2:00 in the afternoon, her heart rate continued to accelerate and she converted to atrial fibrillation, RVR.  Some flutter waves were seen as well.  Adenosine was given, which temporarily slowed her down and confirmed the diagnosis.  [name] is not aware of the rapid or irregular heart rate.  She continues to have a headache which she has pretty much had since admission.  She has not had any chest pain or shortness of breath.  She has not had any presyncope or syncope.  If it were not for the monitor, there will be no awareness of the atrial fib.  Her husband is at the bedside and confirms that she is never had any cardiac issues or cardiac complaints.  She has never had a cardiac evaluation.   Past Medical History:  Diagnosis Date   Hepatitis C    Hyperlipidemia    Hypertension    Osteoporosis     Past Surgical History:  Procedure Laterality Date   IR IVC FILTER PLMT / S&I /IMG GUID/MOD SED  02/22/2020     Prior to Admission medications   Medication Sig Start Date End Date Taking? Authorizing Provider  ALPRAZolam Duanne Moron) 0.5 MG tablet Take 0.25 mg by mouth daily as needed for anxiety.    Yes [provider]  amLODipine (NORVASC) 5 MG tablet Take 5 mg by mouth daily. 01/26/20  Yes [provider]  aspirin 81 MG tablet Take 1 tablet (81 mg total) by mouth daily. 08/17/14  Yes Rai, Ripudeep K, MD  atorvastatin (LIPITOR) 10 MG tablet Take 10 mg by  mouth every evening.    Yes [provider]  Cholecalciferol (VITAMIN D3) 2000 UNITS TABS Take 2,000 Units by mouth every evening.   Yes [provider]  levothyroxine (SYNTHROID, LEVOTHROID) 125 MCG tablet Take 125 mcg by mouth daily before breakfast.   Yes [provider]  losartan (COZAAR) 100 MG tablet Take 100 mg by mouth every evening.   Yes [provider]  Probiotic Product (ALIGN PO) Take 1 capsule by mouth daily.   Yes [provider]    SYMBICORT 160-4.5 MCG/ACT inhaler Inhale 1 puff into the lungs daily as needed (shortness of breath).  12/03/19  Yes [provider]  TRELEGY ELLIPTA 100-62.5-25 MCG/INH AEPB Inhale 1 puff into the lungs daily as needed (shortness of breath).  02/03/20  Yes [provider]  allopurinol (ZYLOPRIM) 300 MG tablet Take 1 tablet (300 mg total) by mouth daily. 02/18/20   Magrinat, Virgie Dad, MD  hydroxyurea (HYDREA) 500 MG capsule Take 1 capsule (500 mg total) by mouth daily. May take with food to minimize GI side effects. 02/18/20   Magrinat, Virgie Dad, MD    Inpatient Medications: Scheduled Meds:   stroke: mapping our early stages of recovery book   Does not apply Once   adenosine (ADENOCARD) IV  12 mg Intravenous STAT   allopurinol  300 mg Oral Daily   amLODipine  10 mg Oral Daily   chlorhexidine  15 mL Mouth Rinse BID   diltiazem  15 mg Intravenous Once   fluticasone furoate-vilanterol  1 puff Inhalation Daily   hydrALAZINE  25 mg Oral TID   hydroxyurea  1,000 mg Oral Daily   levothyroxine  125 mcg Oral QAC breakfast   losartan  50 mg Oral BID   metoprolol tartrate       pantoprazole  40 mg Oral Daily   [START ON 02/24/2020] predniSONE  20 mg Oral Q breakfast   Followed by   Derrill Memo ON 02/25/2020] predniSONE  10 mg Oral Q breakfast   Followed by   Derrill Memo ON 02/26/2020] predniSONE  5 mg Oral Q breakfast   Followed by   Derrill Memo ON 02/27/2020] predniSONE  5 mg Oral Q breakfast   senna-docusate  1 tablet Oral BID   sodium chloride flush  10-40 mL Intracatheter Q12H   topiramate  25 mg Oral BID   Continuous Infusions:  amiodarone 60 mg/hr (02/23/20 1721)   diltiazem (CARDIZEM) infusion     PRN Meds: acetaminophen **OR** acetaminophen (TYLENOL) oral liquid 160 mg/5 mL **OR** acetaminophen, butalbital-acetaminophen-caffeine, labetalol, ondansetron (ZOFRAN) IV, sodium chloride flush  Allergies:    Allergies  Allergen Reactions   Erythromycin Itching and Other (See Comments)     Severe stomach pains, diarrhea    Social History:   Social History   Socioeconomic History   Marital status: Married    Spouse name: Not on file   Number of children: Not on file   Years of education: Not on file   Highest education level: Not on file  Occupational History   Not on file  Tobacco Use   Smoking status: Former Smoker   Smokeless tobacco: Never Used  Substance and Sexual Activity   Alcohol use: No   Drug use: No   Sexual activity: Not on file  Other Topics Concern   Not on file  Social History Narrative   Not on file   Social Determinants of Health   Financial Resource Strain:    Difficulty of Paying Living Expenses:  Food Insecurity:    Worried About Charity fundraiser in the Last Year:    Arboriculturist in the Last Year:   Transportation Needs:    Film/video editor (Medical):    Lack of Transportation (Non-Medical):   Physical Activity:    Days of Exercise per Week:    Minutes of Exercise per Session:   Stress:    Feeling of Stress :   Social Connections:    Frequency of Communication with Friends and Family:    Frequency of Social Gatherings with Friends and Family:    Attends Religious Services:    Active Member of Clubs or Organizations:    Attends Music therapist:    Marital Status:   Intimate Partner Violence:    Fear of Current or Ex-Partner:    Emotionally Abused:    Physically Abused:    Sexually Abused:     Family History:   History reviewed. No pertinent family history. Family Status:  Family Status  Relation Name Status   Mother  Deceased   Father  Deceased    ROS:  Please see the history of present illness.  All other ROS reviewed and negative.     Physical Exam/Data:   Vitals:   02/23/20 1720 02/23/20 1725 02/23/20 1728 02/23/20 1730  BP: (!) 119/93 104/90 (!) 142/91   Pulse: (!) 128 (!) 128 (!) 134 (!) 132  Resp: (!) 29 (!) 22 (!) 25 (!) 24  Temp:      TempSrc:      SpO2: 94% 98% 99% 99%    Weight:      Height:        Intake/Output Summary (Last 24 hours) at 02/23/2020 1743 Last data filed at 02/23/2020 0500 Gross per 24 hour  Intake 480 ml  Output 595 ml  Net -115 ml    Last 3 Weights 02/15/2020 02/14/2020 02/13/2020  Weight (lbs) 146 lb 2.6 oz 159 lb 2.8 oz 155 lb  Weight (kg) 66.3 kg 72.2 kg 70.308 kg     Body mass index is 26.73 kg/m.   General:  Well nourished, well developed, female in no acute distress HEENT: normal for age Lymph: no adenopathy Neck: JVD - not elevated Endocrine:  No thryomegaly Vascular: No carotid bruits; 4/4 extremity pulses 2+  Cardiac:  normal S1, S2; rapid and irregular rate and rhythm; no murmur Lungs:  clear bilaterally, no wheezing, rhonchi or rales  Abd: soft, nontender, no hepatomegaly  Ext: no edema Musculoskeletal:  No deformities, BUE and BLE strength normal and equal Skin: warm and dry  Neuro:  CNs 2-12 intact, no focal abnormalities noted Psych:  Normal affect   EKG:  The EKG was personally reviewed and demonstrates:   4/5 ECG is sinus rhythm with sinus arrhythmia, heart rate 80, no acute ischemic changes 4/14 the ECG is SVT, heart rate 185 and regular Telemetry:  Telemetry was personally reviewed and demonstrates: Sinus rhythm with pronounced sinus arrhythmia, gradually increasing ventricular and supraventricular ectopy, transitioning to atrial fib RVR and she has also had some SVT and short runs of VT.    Currently, she is in atrial fib RVR with a heart rate in the 130s and 140s.   CV studies:   ECHO: 02/14/2020  1. Left ventricular ejection fraction, by estimation, is 65 to 70%. The  left ventricle has normal function. The left ventricle has no regional  wall motion abnormalities. Left ventricular diastolic parameters were  normal.   2.  Right ventricular systolic function is normal. The right ventricular  size is normal. Tricuspid regurgitation signal is inadequate for assessing  PA pressure.   3. The mitral valve is  grossly normal. No evidence of mitral valve  regurgitation. No evidence of mitral stenosis.   4. The aortic valve is tricuspid. Aortic valve regurgitation is not  visualized. Mild aortic valve sclerosis is present, with no evidence of  aortic valve stenosis.   5. The inferior vena cava is normal in size with greater than 50%  respiratory variability, suggesting right atrial pressure of 3 mmHg.   Conclusion(s)/Recommendation(s): No intracardiac source of embolism  detected on this transthoracic study. A transesophageal echocardiogram is  recommended to exclude cardiac source of embolism if clinically indicated.    Laboratory Data:   Chemistry Recent Labs  Lab 02/20/20 0414 02/21/20 0403 02/22/20 0346  NA 138 140 139  K 3.4* 3.3* 3.3*  CL 109 113* 109  CO2 21* 20* 21*  GLUCOSE 146* 139* 123*  BUN 11 10 7*  CREATININE 0.81 0.72 0.68  CALCIUM 8.3* 8.2* 8.8*  GFRNONAA >60 >60 >60  GFRAA >60 >60 >60  ANIONGAP _0 Lab Results  Component Value Date   ALT 12 02/20/2020   AST 12 (L) 02/20/2020   ALKPHOS 34 (L) 02/20/2020   BILITOT 0.8 02/20/2020   Hematology Recent Labs  Lab 02/20/20 0414 02/21/20 0403 02/22/20 0346  WBC 18.0* 19.5* 18.4*  RBC 4.36 4.26 4.46  HGB 13.3 12.9 13.4  HCT 39.5 38.4 40.4  MCV 90.6 90.1 90.6  MCH 30.5 30.3 30.0  MCHC 33.7 33.6 33.2  RDW 14.3 14.7 15.4  PLT 548* 324 160   Cardiac Enzymes High Sensitivity Troponin:  No results for input(s): TROPONINIHS in the last 720 hours.    BNPNo results for input(s): BNP, PROBNP in the last 168 hours.  DDimer No results for input(s): DDIMER in the last 168 hours. TSH:  Lab Results  Component Value Date   TSH 1.581 02/14/2020   Lipids: Lab Results  Component Value Date   CHOL 172 02/14/2020   HDL 71 02/14/2020   LDLCALC 92 02/14/2020   TRIG 46 02/14/2020   CHOLHDL 2.4 02/14/2020   HgbA1c: Lab Results  Component Value Date   HGBA1C 6.1 (H) 02/14/2020   Magnesium:  Magnesium  Date  Value Ref Range Status  02/21/2020 2.2 1.7 - 2.4 mg/dL Final    Comment:    Performed at [facility], [place] [address] [place], [place] [address]     Radiology/Studies:  DG Chest 2 View  Result Date: 02/22/2020 CLINICAL DATA:  Shortness of breath EXAM: CHEST - 2 VIEW COMPARISON:  February 14, 2020 FINDINGS: There is atelectatic change in the right base. Lungs elsewhere are clear. Heart size and pulmonary vascularity are normal. No adenopathy. No bone lesions. IMPRESSION: Right base atelectasis. Lungs elsewhere clear. Cardiac silhouette within normal limits. Electronically Signed   By: Lowella Grip III M.D.   On: 02/22/2020 09:20   CT HEAD WO CONTRAST  Result Date: 02/19/2020 CLINICAL DATA:  74 year old female who presented with right side intraventricular hemorrhage on 02/14/2020. Altered mental status. History of polycythemia. Subsequent encounter. EXAM: CT HEAD WITHOUT CONTRAST TECHNIQUE: Contiguous axial images were obtained from the base of the skull through the vertex without intravenous contrast. COMPARISON:  CTA head and neck earlier today. Head CT yesterday, and earlier. FINDINGS: Brain: Continued stable volume of right lateral intraventricular hemorrhage. Small volume of layering  hemorrhage in the contralateral left occipital horn is stable. No other intraventricular blood at this time. No ventriculomegaly. Pronounced bilateral cerebral white matter hypodensity is stable. No significant intracranial mass effect. Stable gray-white matter differentiation throughout the brain. No cortically based acute infarct identified. Vascular: Stable. Mild Calcified atherosclerosis at the skull base. Skull: Stable, negative. Sinuses/Orbits: Visualized paranasal sinuses and mastoids are stable and well pneumatized. Other: No acute orbit or scalp soft tissue finding. IMPRESSION: 1. Stable since 02/18/2020. Unchanged volume of IVH primarily in the right lateral ventricle. No intracranial mass effect. 2.  Severe cerebral white matter disease. Numerous chronic microhemorrhages throughout the brain also demonstrated recently by MRI. Electronically Signed   By: Genevie Ann M.D.   On: 02/19/2020 20:39   CT ANGIO CHEST PE W OR WO CONTRAST  Addendum Date: 02/22/2020   ADDENDUM REPORT: 02/22/2020 12:39 ADDENDUM: Critical Value/emergent results were called by telephone at the time of interpretation on 02/22/2020 at 12:32 pm to provider Dr. Leonie Man, who verbally acknowledged these results. Electronically Signed   By: Marijo Conception M.D.   On: 02/22/2020 12:39   Result Date: 02/22/2020 CLINICAL DATA:  Shortness of breath. EXAM: CT ANGIOGRAPHY CHEST WITH CONTRAST TECHNIQUE: Multidetector CT imaging of the chest was performed using the standard protocol during bolus administration of intravenous contrast. Multiplanar CT image reconstructions and MIPs were obtained to evaluate the vascular anatomy. CONTRAST:  125m OMNIPAQUE IOHEXOL 350 MG/ML SOLN COMPARISON:  None. FINDINGS: Cardiovascular: Filling defects are noted in the lower lobe branches of both pulmonary arteries consistent with acute pulmonary emboli. Normal cardiac size. No pericardial effusion. Coronary artery calcifications are noted. Atherosclerosis of thoracic aorta is noted without aneurysm formation. Mediastinum/Nodes: No enlarged mediastinal, hilar, or axillary lymph nodes. Thyroid gland, trachea, and esophagus demonstrate no significant findings. Lungs/Pleura: No pneumothorax or pleural effusion is noted. Emphysematous disease is noted in the upper lobes bilaterally. Nodular densities are noted laterally in the right lower lobe most consistent with atypical inflammation or other inflammation. Right middle lobe opacity is noted concerning for atelectasis or possibly inflammation. Upper Abdomen: Probable exophytic cyst seen arising from upper pole of left kidney. Musculoskeletal: No chest wall abnormality. No acute or significant osseous findings. Review of the MIP  images confirms the above findings. IMPRESSION: 1. Acute bilateral lower lobe pulmonary emboli are noted. 2. Coronary artery calcifications are noted suggesting coronary artery disease. 3. Emphysematous disease is noted in the upper lobes bilaterally. 4. Nodular densities are noted laterally in the right lower lobe most consistent with atypical inflammation or other inflammation. Right middle lobe opacity is noted concerning for atelectasis or possibly inflammation. Follow-up unenhanced chest CT in 3-4 weeks is recommended to ensure resolution and rule out neoplasm. Aortic Atherosclerosis (ICD10-I70.0) and Emphysema (ICD10-J43.9). Electronically Signed: By: JMarijo ConceptionM.D. On: 02/22/2020 12:26   IR IVC FILTER PLMT / S&I /Burke KeelsGUID/MOD SED  Result Date: 02/22/2020 INDICATION: 74year old with intraventricular hemorrhage, polycythemia vera and bilateral pulmonary emboli. Patient is not a candidate for anticoagulation due to the intraventricular hemorrhage. Request for IVC filter placement. EXAM: IVC FILTER PLACEMENT; IVC VENOGRAM; ULTRASOUND FOR VASCULAR ACCESS Physician: AStephan Minister HAnselm Pancoast MD MEDICATIONS: None. ANESTHESIA/SEDATION: Fentanyl 0.5 mcg IV; Versed 50 mg IV Moderate Sedation Time:  24 minutes minutes The patient was continuously monitored during the procedure by the interventional radiology nurse under my direct supervision. CONTRAST:  50 mL Omnipaque 300 FLUOROSCOPY TIME:  Fluoroscopy Time: 4 minutes, 18 seconds, 90 mGy COMPLICATIONS: None immediate. PROCEDURE: Informed consent was obtained  for an IVC venogram and filter placement. Ultrasound demonstrated a patent right internal jugular vein. Ultrasound image obtained for documentation. The right side of the neck was prepped and draped in a sterile fashion. Maximal barrier sterile technique was utilized including caps, mask, sterile gowns, sterile gloves, sterile drape, hand hygiene and skin antiseptic. The skin was anesthetized with 1% lidocaine. A 21  gauge needle was directed into the vein with ultrasound guidance and a micropuncture dilator set was placed. A wire was advanced into the IVC. The filter sheath was advanced over the wire into the IVC. An IVC venogram was performed. Five French catheter was used to cannulate the right renal vein with a Bentson wire. Fluoroscopic images were obtained for documentation. A Bard Denali filter was deployed below the lowest renal vein. A follow-up venogram was performed and the vascular sheath was removed with manual compression. FINDINGS: IVC was patent. Bilateral renal veins were identified. The filter was deployed below the lowest renal vein. Follow-up venogram confirmed placement within the IVC and below the renal veins. IMPRESSION: Successful placement of a retrievable IVC filter. PLAN: This IVC filter is potentially retrievable. The patient will be assessed for filter retrieval by Interventional Radiology in approximately 8-12 weeks. Further recommendations regarding filter retrieval, continued surveillance or declaration of device permanence, will be made at that time. Electronically Signed   By: Markus Daft M.D.   On: 02/22/2020 17:50   VAS [place] LOWER EXTREMITY VENOUS (DVT)  Result Date: 02/23/2020  Lower Venous DVTStudy Indications: Pulmonary embolism.  Limitations: Patient positioning. Comparison Study: no prior Performing Technologist: [name] RVS  Examination Guidelines: A complete evaluation includes B-mode imaging, spectral Doppler, color Doppler, and power Doppler as needed of all accessible portions of each vessel. Bilateral testing is considered an integral part of a complete examination. Limited examinations for reoccurring indications may be performed as noted. The reflux portion of the exam is performed with the patient in reverse Trendelenburg.  +---------+---------------+---------+-----------+----------+--------------+  RIGHT     Compressibility Phasicity Spontaneity Properties Thrombus Aging   +---------+---------------+---------+-----------+----------+--------------+  CFV       Full            Yes       Yes                                    +---------+---------------+---------+-----------+----------+--------------+  SFJ       Full                                                             +---------+---------------+---------+-----------+----------+--------------+  FV Prox   Full                                                             +---------+---------------+---------+-----------+----------+--------------+  FV Mid    Full                                                             +---------+---------------+---------+-----------+----------+--------------+  FV Distal Full                                                             +---------+---------------+---------+-----------+----------+--------------+  PFV       Full                                                             +---------+---------------+---------+-----------+----------+--------------+  POP       Full            Yes       Yes                                    +---------+---------------+---------+-----------+----------+--------------+  PTV       Full                                                             +---------+---------------+---------+-----------+----------+--------------+  PERO      Full                                                             +---------+---------------+---------+-----------+----------+--------------+   +---------+---------------+---------+-----------+----------+--------------+  LEFT      Compressibility Phasicity Spontaneity Properties Thrombus Aging  +---------+---------------+---------+-----------+----------+--------------+  CFV       Full            Yes       Yes                                    +---------+---------------+---------+-----------+----------+--------------+  SFJ       Full                                                              +---------+---------------+---------+-----------+----------+--------------+  FV Prox   Full                                                             +---------+---------------+---------+-----------+----------+--------------+  FV Mid    Full                                                             +---------+---------------+---------+-----------+----------+--------------+  FV Distal Full                                                             +---------+---------------+---------+-----------+----------+--------------+  PFV       Full                                                             +---------+---------------+---------+-----------+----------+--------------+  POP       Full            Yes       Yes                                    +---------+---------------+---------+-----------+----------+--------------+  PTV       Full                                                             +---------+---------------+---------+-----------+----------+--------------+  PERO      Full                                                             +---------+---------------+---------+-----------+----------+--------------+     Summary: BILATERAL: - No evidence of deep vein thrombosis seen in the lower extremities, bilaterally.   *See table(s) above for measurements and observations. Electronically signed by Harold Barban MD on 02/23/2020 at 5:39:17 PM.    Final     Assessment and Plan:   1.  Atrial fib/flutter, RVR: -Metoprolol IV 5 mg, with a temporary slowing in her heart rate. -However, she has had the amlodipine and hydralazine today in addition to the metoprolol, her blood pressure is now in the 02X systolic -At this time, her blood pressure is too low to start IV Cardizem -If her blood pressure improves and her heart rate remains elevated, okay to use IV Cardizem but would start it without a bolus (bolus discontinued) -Her EF was normal and she has no signs or symptoms of volume overload, so if we need  to use IV fluid to bolster her blood pressure, that should be okay -She converted to atrial fibrillation sometime between 1:30 and 2 PM today. -Make her n.p.o. after midnight.  If she is still going fast and has not spontaneously converted, consider cardioversion tomorrow a.m., as it will be less than 24 hours since onset -Dr. Gwenlyn Found recommends transfer to [facility], transfer ordered  2.  Hypertension: -Prior to admission, she was on amlodipine 5 mg daily and losartan 100 mg daily.  The losartan has been discontinued. -The amlodipine has been increased to 10 mg daily. -She has been started on hydralazine 25 mg 3 times daily, last tablet given at 1450  1 PM, after she went into rapid atrial fib. -Her blood pressure is currently on the low side of normal -Hold the amlodipine so that we may use Cardizem if we need to -At this time, we will hold the hydralazine as well to give this more blood pressure room to add Cardizem  3.  Hypothyroidism -She is on her home dose of levothyroxine -Her TSH was normal on admission  4.  Hypokalemia: -She has not had any potassium today, will give 40 mEq  5.  Intraventricular hemorrhage -She has been on steroids for this, they are being tapered -Per Neurology  6.  Abnormal chest CT: -See CT report above.  In addition to the bilateral PE, she had nodular densities in the right lateral lobe and atelectasis versus inflammation in the right middle lobe.  Coronary calcifications were also seen. 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   Signed, Rosaria Ferries, PA-C  02/23/2020 5:43 PM  Agree with note by Rosaria Ferries PA-C  We were asked to see [name] for new onset A. fib with RVR.  She is a 74 year old female with a history of hypertension who was admitted with hypertensive urgency and intracranial bleed/stroke.  She has no prior cardiac history.  She also had pulmonary emboli and has had an IVC filter placed because of inability to anticoagulate given her neurologic condition.  She went into A. fib with RVR early this afternoon with heart rates in the 150-170 range.  She was given IV Lopressor and adenosine with question demonstration of flutter waves.  She is unaware that she is in A. fib.  Her blood pressure is soft in the 61-607 range systolic.  Her exam is benign.  Her EKG shows old right right bundle branch block.  Agree with IV amiodarone for now.  I think her blood pressure is too low to begin Cardizem drip.  Hopefully she will convert.  If not, she may need DC cardioversion since she is within 24 to 48 hours of onset.  Lorretta Harp, M.D., [place], [facility], [place] [address] [place], [place]  [address]  [phone] 02/23/2020 6:24 PM

## 2020-02-23 NOTE — Progress Notes (Addendum)
Patient's HR sustaining at 160. Dr. Leonie Man,  NP [place], and Dr. Elsworth Soho notified.  Notified Rapid Response  14:59: Received order from Rachel Moulds for Cardizem. Delayed administration due to difficult IV stick.   Obtained EKG showed V Tach, questioned administration of Cardizem and ordered to give Lopressor.   16:25: Lopressor given, pt HR 168. Order to give Adenosine, pharmacy paged. AC, doctors, and SWAT at bedside.   16:53: Adenosine given, pt in A. Flutter.  17:19: Order to give amiodarone.   17:55: HR 90, BP 131/80

## 2020-02-23 NOTE — Progress Notes (Signed)
Paged MD T Opyd [phone] [name] had 13 beats of wide QRS, was asleep, VSS. Do you want to order K lab? last one was 3.3 on 4/13.

## 2020-02-23 NOTE — Progress Notes (Addendum)
Admitted 4/4 with hypertensive emergency and acute intraventricular hemorrhage within the right lateral ventricle.  Subsequent MRI has shown severe chronic ischemic microangiopathy with microhemorrhages?  Amyloid -reviewed brain imaging studies  COURSE _ Subsequent work-up also showed polycythemia and thrombocytosis, bone marrow biopsy suggesting polycythemia vera with JAK2 mutation , followed by oncology-consultation reviewed  4/13 Developed respiratory distress overnight and 2 L of oxygen requirement, CT angiogram chest showed acute bilateral lower lobe emboli, segmental no evidence of RV strain.-opacities also noted in right middle and lower lobe Underwent emergent placement of IVC filter by IR  4/14 called emergently to bedside for tachycardia She is otherwise asymptomatic, complains of frontal headache which has been present for the past few days, no chest pain, shortness of breath  Vitals:   02/23/20 1555 02/23/20 1633  BP: (!) 149/84 102/80  Pulse: (!) 176 (!) 144  Resp: (!) 24 20  Temp:  98.6 F (37 C)  SpO2: 96% 95%     EKG reviewed which suggest SVT 5 mg Lopressor given IV, which showed some flutter waves 6 mg adenosine IV confirm flutter waves  On exam-nonfocal, S1-S2 tacky, regular, clear lungs, no JVD or edema, no accessory muscle use  Echo 4/5 shows normal RV function, no intracardiac source of embolism  Labs reviewed  -significant for hypokalemia  Impression/plan Atrial flutter/RVR -cardiology consulted, will start IV amiodarone 150 mg bolus and defer further rate control to cardiology, not a candidate for anticoagulation  Acute pulmonary embolism -risk factor likely immobility due to stroke, possibly thrombocytosis contributing -Not a candidate for anticoagulation per neurology -Status post IVC filter, venous duplex surprisingly negative  Husband updated in detail at bedside Critical care time x 31 mins  Kara Mead MD. [facility]. [facility]  If no response to pager , please call [phone]   02/23/2020

## 2020-02-23 NOTE — Progress Notes (Addendum)
Call from tele, pt HR 150 for 3.9 seconds, Dr. Leonie Man notified. Also mentioned patient's most recent potassium 3.3, no labs drawn this morning. Awaiting response

## 2020-02-23 NOTE — Progress Notes (Signed)
Lower extremity venous has been completed.   Preliminary results in CV Proc.   [name] 02/23/2020 11:34 AM

## 2020-02-23 NOTE — Progress Notes (Signed)
RT was at bedside on standby for amiodorone bolus.

## 2020-02-23 NOTE — Significant Event (Signed)
Rapid Response Event Note  Overview: Time Called: 1504 Arrival Time: 1508 Event Type: Cardiac Called for pt having a heart rate 180-190 bpm.  VS:  BP 133/88, HR 168, RR 20, SpO2 93% on 4LNC.   Initial Focused Assessment: Pt lying in bed, asymptomatic. Pt is warm, dry to touch. Lung sounds are clear. Heart rate is fast, irregular. EKG: SVT. Pt current IV access noted to be leaking. IV access attempted x2 without success, IV team consulted. Initial order written for Cardizem gtt, order discontinued.   Interventions: -EKG -Established IV access  -Returned to bedside for assistance with transfer of patient.   Plan of Care (if not transferred): Transfer to ICU, [place].   Event Summary: Outcome: Transferred (Comment) Event End Time: 1800 Rapid response called away to another emergency at 1603 and returned to Ms. Schermer's bedside at 1730. AC RN and SWOT RN assisted in my absence.   Casimer Bilis

## 2020-02-23 NOTE — Progress Notes (Signed)
PT Cancellation Note  Patient Details Name: [name] MRN: [id number] DOB: 08-26-1946   Cancelled Treatment:    Reason Eval/Treat Not Completed: Medical issues which prohibited therapy  Pt's HR is 180 bpm and BP145/117.  Pt not safe to participate with therapy at this time.  Will follow up at later date. Maggie Font, PT Acute Rehab Services Pager [phone] [facility] [phone]    Karlton Lemon 02/23/2020, 4:08 PM

## 2020-02-23 NOTE — Progress Notes (Addendum)
Pt HR going up to 160s, Dr. Leonie Man notified, awaiting response

## 2020-02-23 NOTE — Progress Notes (Signed)
  Speech Language Pathology Treatment: Cognitive-Linquistic  Patient Details Name: [name] MRN: [id number] DOB: Jun 09, 1946 Today's Date: 02/23/2020 Time: [id number] SLP Time Calculation (min) (ACUTE ONLY): 12 min  Assessment / Plan / Recommendation Clinical Impression  Pt with episodes of tachycardia during session.  Treatment focused on attention and recall.  Pt with 5/5 immediate memory, 0/5 after three minute delay.  Reviewed visualization and association strategies.  Pt admittedly distracted, not feeling well this afternoon.  Demonstrated adequate focused and sustained attention; selective attention impaired with noted difficulty attending to target word within array of choices, requiring verbal cues on 40% of opportunities. Pt will benefit from [facility] SLP upon D/C.  She agrees with plan.     HPI HPI: 74 y.o. female with history of HTN presenting with severe persistent HA accompanied by nausea and vomiting. Dx right lateral IVH. Developed acute PE; IVC filter placed 4/13.       SLP Plan  Continue with current plan of care       Recommendations   [facility] SLP                Follow up Recommendations: Home health SLP SLP Visit Diagnosis: Cognitive communication deficit [id number]) Plan: Continue with current plan of care       [place]. Tivis Ringer,  CCC/SLP Acute Rehabilitation Services Office number [phone] Pager [phone]  [name] 02/23/2020, 2:10 PM

## 2020-02-24 ENCOUNTER — Inpatient Hospital Stay (HOSPITAL_COMMUNITY): Payer: Medicare HMO

## 2020-02-24 ENCOUNTER — Inpatient Hospital Stay (HOSPITAL_COMMUNITY): Payer: Medicare HMO | Admitting: Anesthesiology

## 2020-02-24 ENCOUNTER — Encounter (HOSPITAL_COMMUNITY)
Admission: EM | Disposition: A | Discharge status: Rehabilitation Facility | Payer: Self-pay | Source: Home / Self Care | Attending: Neurology

## 2020-02-24 DIAGNOSIS — D751 Secondary polycythemia: Secondary | ICD-10-CM

## 2020-02-24 DIAGNOSIS — I10 Essential (primary) hypertension: Secondary | ICD-10-CM | POA: Diagnosis not present

## 2020-02-24 DIAGNOSIS — E7800 Pure hypercholesterolemia, unspecified: Secondary | ICD-10-CM | POA: Diagnosis not present

## 2020-02-24 DIAGNOSIS — Z452 Encounter for adjustment and management of vascular access device: Secondary | ICD-10-CM

## 2020-02-24 DIAGNOSIS — R9389 Abnormal findings on diagnostic imaging of other specified body structures: Secondary | ICD-10-CM

## 2020-02-24 DIAGNOSIS — I610 Nontraumatic intracerebral hemorrhage in hemisphere, subcortical: Secondary | ICD-10-CM

## 2020-02-24 DIAGNOSIS — I615 Nontraumatic intracerebral hemorrhage, intraventricular: Secondary | ICD-10-CM | POA: Diagnosis not present

## 2020-02-24 DIAGNOSIS — I4892 Unspecified atrial flutter: Secondary | ICD-10-CM | POA: Diagnosis not present

## 2020-02-24 HISTORY — PX: IR ANGIO INTRA EXTRACRAN SEL INTERNAL CAROTID BILAT MOD SED: IMG5363

## 2020-02-24 HISTORY — PX: IR ANGIO VERTEBRAL SEL VERTEBRAL UNI R MOD SED: IMG5368

## 2020-02-24 HISTORY — PX: IR VENO SAGITTAL SINUS: IMG687

## 2020-02-24 HISTORY — PX: IR US GUIDE VASC ACCESS RIGHT: IMG2390

## 2020-02-24 HISTORY — PX: IR THROMBECT VENO MECH MOD SED: IMG2300

## 2020-02-24 HISTORY — PX: IR VENO/JUGULAR RIGHT: IMG2275

## 2020-02-24 HISTORY — PX: IR ANGIOGRAM SELECTIVE EACH ADDITIONAL VESSEL: IMG667

## 2020-02-24 HISTORY — PX: RADIOLOGY WITH ANESTHESIA: SHX6223

## 2020-02-24 LAB — BASIC METABOLIC PANEL
Anion gap: 13 (ref 5–15)
BUN: 6 mg/dL — ABNORMAL LOW (ref 8–23)
CO2: 21 mmol/L — ABNORMAL LOW (ref 22–32)
Calcium: 8.5 mg/dL — ABNORMAL LOW (ref 8.9–10.3)
Chloride: 106 mmol/L (ref 98–111)
Creatinine, Ser: 0.73 mg/dL (ref 0.44–1.00)
GFR calc Af Amer: >60 mL/min (ref >60)
GFR calc non Af Amer: >60 mL/min (ref >60)
Glucose, Bld: 143 mg/dL — ABNORMAL HIGH (ref 70–99)
Potassium: 2.3 mmol/L — CL (ref 3.5–5.1)
Sodium: 140 mmol/L (ref 135–145)

## 2020-02-24 LAB — DIC (DISSEMINATED INTRAVASCULAR COAGULATION)PANEL
D-Dimer, Quant: >20 ug/mL-FEU — ABNORMAL HIGH (ref 0.00–0.50)
Fibrinogen: 203 mg/dL — ABNORMAL LOW (ref 210–475)
INR: 1.3 — ABNORMAL HIGH (ref 0.8–1.2)
Platelets: 26 10*3/uL — CL (ref 150–400)
Prothrombin Time: 15.6 seconds — ABNORMAL HIGH (ref 11.4–15.2)
Smear Review: NONE SEEN
aPTT: 42 seconds — ABNORMAL HIGH (ref 24–36)

## 2020-02-24 LAB — CBC
HCT: 41.4 % (ref 36.0–46.0)
Hemoglobin: 14.1 g/dL (ref 12.0–15.0)
MCH: 29.9 pg (ref 26.0–34.0)
MCHC: 34.1 g/dL (ref 30.0–36.0)
MCV: 87.9 fL (ref 80.0–100.0)
Platelets: 24 10*3/uL — CL (ref 150–400)
RBC: 4.71 MIL/uL (ref 3.87–5.11)
RDW: 15 % (ref 11.5–15.5)
WBC: 10.2 10*3/uL (ref 4.0–10.5)
nRBC: 0 % (ref 0.0–0.2)

## 2020-02-24 LAB — ACETYLCHOLINE RECEPTOR, BINDING: Acety choline binding ab: <0.03 nmol/L (ref 0.00–0.24)

## 2020-02-24 LAB — PLATELET COUNT: Platelets: 25 10*3/uL — CL (ref 150–400)

## 2020-02-24 LAB — PLATELET BY CITRATE

## 2020-02-24 LAB — MAGNESIUM: Magnesium: 1.8 mg/dL (ref 1.7–2.4)

## 2020-02-24 SURGERY — IR WITH ANESTHESIA
Anesthesia: General

## 2020-02-24 MED ORDER — NITROGLYCERIN 1 MG/10 ML FOR IR/CATH LAB
INTRA_ARTERIAL | Status: AC
  Filled 2020-02-24: qty 10

## 2020-02-24 MED ORDER — ALBUMIN HUMAN 5 % IV SOLN: INTRAVENOUS | Status: DC | PRN

## 2020-02-24 MED ORDER — EPTIFIBATIDE 20 MG/10ML IV SOLN
INTRAVENOUS | Status: AC
  Filled 2020-02-24: qty 10

## 2020-02-24 MED ORDER — FENTANYL CITRATE (PF) 100 MCG/2ML IJ SOLN
INTRAMUSCULAR | Status: DC | PRN
  Administered 2020-02-24: 50 ug via INTRAVENOUS

## 2020-02-24 MED ORDER — CLOPIDOGREL BISULFATE 300 MG PO TABS
ORAL_TABLET | ORAL | Status: AC
  Filled 2020-02-24: qty 1

## 2020-02-24 MED ORDER — ONDANSETRON HCL 4 MG/2ML IJ SOLN
INTRAMUSCULAR | Status: DC | PRN
  Administered 2020-02-24: 4 mg via INTRAVENOUS

## 2020-02-24 MED ORDER — VERAPAMIL HCL 2.5 MG/ML IV SOLN
INTRAVENOUS | Status: AC
  Filled 2020-02-24: qty 2

## 2020-02-24 MED ORDER — POTASSIUM CHLORIDE CRYS ER 20 MEQ PO TBCR
40.0000 meq | EXTENDED_RELEASE_TABLET | Freq: Once | ORAL | Status: AC
  Administered 2020-02-24: 40 meq via ORAL
  Filled 2020-02-24: qty 2

## 2020-02-24 MED ORDER — FENTANYL CITRATE (PF) 100 MCG/2ML IJ SOLN
INTRAMUSCULAR | Status: AC
  Filled 2020-02-24: qty 2

## 2020-02-24 MED ORDER — IOHEXOL 240 MG/ML SOLN
INTRAMUSCULAR | Status: AC
  Filled 2020-02-24: qty 200

## 2020-02-24 MED ORDER — MAGNESIUM SULFATE 2 GM/50ML IV SOLN
2.0000 g | Freq: Once | INTRAVENOUS | Status: AC
  Administered 2020-02-24: 2 g via INTRAVENOUS
  Filled 2020-02-24: qty 50

## 2020-02-24 MED ORDER — TIROFIBAN HCL IN NACL 5-0.9 MG/100ML-% IV SOLN
INTRAVENOUS | Status: AC
  Filled 2020-02-24: qty 100

## 2020-02-24 MED ORDER — ARGATROBAN 50 MG/50ML IV SOLN
0.2500 ug/kg/min | INTRAVENOUS | Status: DC
  Filled 2020-02-24 (×2): qty 50

## 2020-02-24 MED ORDER — PHENYLEPHRINE HCL-NACL 10-0.9 MG/250ML-% IV SOLN
INTRAVENOUS | Status: DC | PRN
  Administered 2020-02-24: 25 ug/min via INTRAVENOUS

## 2020-02-24 MED ORDER — NITROGLYCERIN 1 MG/10 ML FOR IR/CATH LAB
INTRA_ARTERIAL | Status: AC | PRN
  Administered 2020-02-24: 200 ug via INTRA_ARTERIAL
  Administered 2020-02-24: 300 ug via INTRA_ARTERIAL

## 2020-02-24 MED ORDER — LIDOCAINE 2% (20 MG/ML) 5 ML SYRINGE
INTRAMUSCULAR | Status: DC | PRN
  Administered 2020-02-24: 60 mg via INTRAVENOUS

## 2020-02-24 MED ORDER — ASPIRIN 81 MG PO CHEW
CHEWABLE_TABLET | ORAL | Status: AC
  Filled 2020-02-24: qty 1

## 2020-02-24 MED ORDER — TICAGRELOR 90 MG PO TABS
ORAL_TABLET | ORAL | Status: AC
  Filled 2020-02-24: qty 2

## 2020-02-24 MED ORDER — AMIODARONE HCL 200 MG PO TABS
200.0000 mg | ORAL_TABLET | Freq: Two times a day (BID) | ORAL | Status: DC
  Administered 2020-02-24 – 2020-02-26 (×5): 200 mg via ORAL
  Filled 2020-02-24 (×5): qty 1

## 2020-02-24 MED ORDER — DEXAMETHASONE SODIUM PHOSPHATE 10 MG/ML IJ SOLN
INTRAMUSCULAR | Status: DC | PRN
  Administered 2020-02-24: 4 mg via INTRAVENOUS

## 2020-02-24 MED ORDER — SUCCINYLCHOLINE CHLORIDE 200 MG/10ML IV SOSY
PREFILLED_SYRINGE | INTRAVENOUS | Status: DC | PRN
  Administered 2020-02-24: 80 mg via INTRAVENOUS

## 2020-02-24 MED ORDER — IOHEXOL 300 MG/ML  SOLN
75.0000 mL | Freq: Once | INTRAMUSCULAR | Status: AC | PRN
  Administered 2020-02-24: 75 mL via INTRAVENOUS

## 2020-02-24 MED ORDER — TOPIRAMATE 25 MG PO TABS
50.0000 mg | ORAL_TABLET | Freq: Two times a day (BID) | ORAL | Status: DC
  Administered 2020-02-24 – 2020-02-26 (×4): 50 mg via ORAL
  Filled 2020-02-24 (×5): qty 2

## 2020-02-24 MED ORDER — ROCURONIUM BROMIDE 10 MG/ML (PF) SYRINGE
PREFILLED_SYRINGE | INTRAVENOUS | Status: DC | PRN
  Administered 2020-02-24: 10 mg via INTRAVENOUS
  Administered 2020-02-24: 30 mg via INTRAVENOUS
  Administered 2020-02-24 (×2): 10 mg via INTRAVENOUS

## 2020-02-24 MED ORDER — LABETALOL HCL 5 MG/ML IV SOLN
INTRAVENOUS | Status: DC | PRN
  Administered 2020-02-24: 10 mg via INTRAVENOUS

## 2020-02-24 MED ORDER — PROPOFOL 10 MG/ML IV BOLUS
INTRAVENOUS | Status: DC | PRN
  Administered 2020-02-24: 150 mg via INTRAVENOUS

## 2020-02-24 MED ORDER — LACTATED RINGERS IV SOLN: INTRAVENOUS | Status: DC | PRN

## 2020-02-24 MED ORDER — SUGAMMADEX SODIUM 200 MG/2ML IV SOLN
INTRAVENOUS | Status: DC | PRN
  Administered 2020-02-24: 200 mg via INTRAVENOUS

## 2020-02-24 NOTE — Sedation Documentation (Signed)
Assuming care of pt from Fredric Dine, RN. Anesthesia case.

## 2020-02-24 NOTE — Progress Notes (Signed)
PULMONARY / CRITICAL CARE MEDICINE   NAME:  [name], MRN:  [id number], DOB:  07/07/1946, LOS: 28 ADMISSION DATE:  02/13/2020, CONSULTATION DATE:  02/22/2020 REFERRING MD:  Dr. Leonie Man, CHIEF COMPLAINT:  Afib RVR  BRIEF HISTORY:    [name] is a 74 y/o female with a PMHx of hypertension who presented to the ED with c/o headache and admitted for IVH secondary to polycythemia with + JAK mutation, now complicated by bilateral PE s/p IVC filter. HISTORY OF PRESENT ILLNESS   Victoria Sprunger Cooleyis a 74 y.o.femalewith a history of hypertension who presents with headache that started this morning. She states that she was sitting in front computer and noticed that the headache was at the top of her neck. Is fairly severe, and has been persistent since onset. It is primarily occipital in location. Because of this she sought care in the emergency department, where routine blood work revealed evidence of thrombocytosis. She had a CT scan given the new onset headache which reveals intraventricular hemorrhage. She denies any antecedent illness, weakness, numbness, visual change. She does have some nausea and vomiting has been present over the course the day.  Patient transferred to ICU for afib with RVR that has converted to sinus on amiodarone .   SIGNIFICANT PAST MEDICAL HISTORY   HCV HLD HTN Osteoporosis  SIGNIFICANT EVENTS:  4/05: Admitted to [facility] 4/05: Therapeutic plasma exchange 4/06: Bone marrow biopsy 4/07: Therapeutic plasma exchange 4/09: Therapeutic plasma exchange  4/09: HD cath removed   STUDIES:   CT Head (4/5):  1. Acute intraventricular hemorrhage within the right lateral ventricle. No definite parenchymal component identified. This is an unusual pattern, but is perhaps less unlikely in the setting of this patient's abnormal coagulation status. 2. Severe chronic ischemic microangiopathy.  Blood smear (4/5):The red cells show rouleaux but no significant anisocytosis  and specifically no tailed poikilocytes and no nucleated red cells.  Thrombocytosis is confirmed and there are some large platelets present.  There are no clumps.  The white cell series shows an elevated count but no significant left shift, no dysplasia and no blasts noted.  Echocardiogram (4/5): 1. Left ventricular ejection fraction, by estimation, is 65 to 70%. The  left ventricle has normal function. The left ventricle has no regional  wall motion abnormalities. Left ventricular diastolic parameters were  normal.  2. Right ventricular systolic function is normal. The right ventricular  size is normal. Tricuspid regurgitation signal is inadequate for assessing  PA pressure.  3. The mitral valve is grossly normal. No evidence of mitral valve  regurgitation. No evidence of mitral stenosis.  4. The aortic valve is tricuspid. Aortic valve regurgitation is not  visualized. Mild aortic valve sclerosis is present, with no evidence of  aortic valve stenosis.  5. The inferior vena cava is normal in size with greater than 50%  respiratory variability, suggesting right atrial pressure of 3 mmHg.  MRA Head wo contrast/ MR Brain (4/06): 1. Stable right intraventricular hemorrhage with extension into the third and fourth ventricle. Stable ventricular size. No evidence of hydrocephalus.  2. Extensive confluent T2 hyperintensity within the white matter of the cerebral hemispheres, basal ganglia, thalami, and pons, nonspecific but may be related to chronic small vessel ischemic changes.  3. Multiple foci of susceptibility artifact throughout the brain parenchyma, progressed from prior MRI. These may be related to microangiopathy versus amyloid angiopathy  CTA Chest (4/13):  IMPRESSION: 1. Acute bilateral lower lobe pulmonary emboli are noted. 2. Coronary artery calcifications are  noted suggesting coronary artery disease. 3. Emphysematous disease is noted in the upper lobes bilaterally. 4. Nodular  densities are noted laterally in the right lower lobe most consistent with atypical inflammation or other inflammation. Right middle lobe opacity is noted concerning for atelectasis or possibly inflammation. Follow-up unenhanced chest CT in 3-4 weeks is recommended to ensure resolution and rule out neoplasm.  Lower extremity doppler US (4/14): No evidence of deep vein thrombosis seen in the lower extremities,  bilaterally.  CULTURES:  RVP (4/5): Negative  ANTIBIOTICS:  none  LINES/TUBES:  4/05: HD Cath>4/09 4/06: Bone marrow biopsy 4/13: IVC filter placement planned tentatively for today   CONSULTANTS:  Neurology, Heme/Onc, IR SUBJECTIVE:  Overnight: Patient had some tachycardia in the 150's, normotensive to hypertensive, saturating well on 4 L [place].   CONSTITUTIONAL: BP (!) 143/64 (BP Location: Right Arm)   Pulse 63   Temp 98.1 F (36.7 C) (Axillary)   Resp 20   Ht _0  (1.575 m)   Wt 66.3 kg   SpO2 95%   BMI 26.73 kg/m   I/O last 3 completed shifts: In: 1199.8 [P.O.:940; I.V.:259.8] Out: 1395 [Urine:1395]        PHYSICAL EXAM: Physical Exam  Constitutional: She is oriented to person, place, and time. She appears distressed.  HENT:  Head: Normocephalic and atraumatic.  Eyes: EOM are normal.  Cardiovascular: Normal rate and intact distal pulses.  No murmur heard. Pulmonary/Chest: Effort normal. No respiratory distress.  Abdominal: Soft. She exhibits no distension. There is no abdominal tenderness.  Musculoskeletal:        General: No edema. Normal range of motion.     Cervical back: Normal range of motion.  Neurological: She is alert and oriented to person, place, and time. She has normal sensation, normal strength and intact cranial nerves. She is not disoriented. Weakness: generalized. No cranial nerve deficit. Coordination normal.  Skin: Skin is warm and dry. She is not diaphoretic.     RESOLVED PROBLEM LIST   ASSESSMENT AND PLAN   Pulmonary  embolism: - PCCM was consulted due to respiratory distress in setting of her bilateral pulmonary embolisms. The PEs are 2/2 to thrombocytosis in the setting of polycythemia vera and immobilization despite being on SCDs. - Patient is saturating well on 2 L supplemental oxygen.  -Patient's vitals have remained stable and saturating well on RA with intermittent supplemental O2 of 2 L nasal cannula.  No sign of RV strain on CTA. likely submassive PE. -Patient is not a candidate for invasive therapies secondary to intracranial hemorrhage. -IVC filter was placed on 4/13. - Patient is hemodynamically stable without respiratory distress.  Afib with RVR: Patient had an episode of atrial fibrillation with RVR on IV amiodarone. Converted to sinus rhythm.  - Switch to PO amiodarone today.  Generalized weakness: - Appears to be 2/2 to deconditioning. No focal neurologic deficits. - Continue PT/OT  IVH - right lateral ventricle IVH secondary to CAA vs. HTN Previous head imaging shows evidence of severe chronic ischemic microangiopathy and right lateral IVH.  MRI showed stable IVH, severe leukoaraiosis and multiple foci throughout but has progressed from previous MRI concerning for microangiopathy versus amyloid. -Patient's echo during this hospitalization showed an EF of 65 to 70% and no source of embolus -Lipid panel showed an LDL of 92 -She is prediabetic with hemoglobin A1c of 6.1. 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 -Patient is currently on hydroxyurea and is status post phlebotomy and pheresis.  Hypertension - Patient was started on losartan 50 mg BID , amlodipine 10 mg, and hydralazine 25 mg 3 times daily for blood pressure control - She is no longer on  Cleviprex -Blood pressure goals of SBP less than 140.  And long-term goal of normotensive.  SUMMARY OF TODAY'S PLAN:  Switch patient to PO amiodarone and continue supplemental O2 as needed.  Best Practice / Goals of Care / Disposition.   Diet: NPO Pain/Anxiety/Delirium protocol (if indicated): N/A VAP protocol (if indicated): N/A DVT prophylaxis: SCDs GI prophylaxis: N/A Glucose control: N/A Mobility: Bedrest Code Status: FULL Family Communication: Husband updated at bedside  Disposition: Pending further stabilization   LABS  Glucose Recent Labs  Lab 02/20/20 0332  GLUCAP 146*    BMET Recent Labs  Lab 02/20/20 0414 02/21/20 0403 02/22/20 0346  NA 138 140 139  K 3.4* 3.3* 3.3*  CL 109 113* 109  CO2 21* 20* 21*  BUN 11 10 7*  CREATININE 0.81 0.72 0.68  GLUCOSE 146* 139* 123*    Liver Enzymes Recent Labs  Lab 02/20/20 0414  AST 12*  ALT 12  ALKPHOS 34*  BILITOT 0.8  ALBUMIN 2.9*    Electrolytes Recent Labs  Lab 02/19/20 0428 02/19/20 0428 02/20/20 0414 02/21/20 0403 02/22/20 0346  CALCIUM 9.0   < > 8.3* 8.2* 8.8*  MG 1.6*  --   --  2.2  --    < > = values in this interval not displayed.    CBC Recent Labs  Lab 02/20/20 0414 02/21/20 0403 02/22/20 0346  WBC 18.0* 19.5* 18.4*  HGB 13.3 12.9 13.4  HCT 39.5 38.4 40.4  PLT 548* 324 160    ABG No results for input(s): PHART, PCO2ART, PO2ART in the last 168 hours.  Coag's No results for input(s): APTT, INR in the last 168 hours.  Sepsis Markers No results for input(s): LATICACIDVEN, PROCALCITON, O2SATVEN in the last 168 hours.  Cardiac Enzymes No results for input(s): TROPONINI, PROBNP in the last 168 hours.  PAST MEDICAL HISTORY :   She  has a past medical history of Hepatitis C, Hyperlipidemia, Hypertension, and Osteoporosis.  PAST SURGICAL HISTORY:  She  has a past surgical history that includes IR IVC FILTER PLMT / S&I /IMG GUID/MOD SED (02/22/2020).  Allergies  Allergen  Reactions  . Erythromycin Itching and Other (See Comments)    Severe stomach pains, diarrhea    No current facility-administered medications on file prior to encounter.   Current Outpatient Medications on File Prior to Encounter  Medication Sig  . ALPRAZolam (XANAX) 0.5 MG tablet Take 0.25 mg by mouth daily as needed for anxiety.   Marland Kitchen amLODipine (NORVASC) 5 MG tablet Take 5 mg by mouth daily.  Marland Kitchen aspirin 81 MG tablet Take 1 tablet (81 mg total) by mouth daily.  Marland Kitchen atorvastatin (LIPITOR) 10 MG tablet Take 10 mg by mouth every evening.   . Cholecalciferol (VITAMIN D3) 2000 UNITS TABS Take 2,000 Units by mouth every evening.  Marland Kitchen levothyroxine (SYNTHROID, LEVOTHROID) 125 MCG tablet Take 125 mcg by mouth daily before breakfast.  . losartan (COZAAR) 100 MG tablet Take 100 mg by mouth every evening.  . Probiotic Product (ALIGN PO) Take 1 capsule by mouth daily.  . SYMBICORT 160-4.5 MCG/ACT inhaler Inhale 1 puff into the lungs daily  as needed (shortness of breath).   . TRELEGY ELLIPTA 100-62.5-25 MCG/INH AEPB Inhale 1 puff into the lungs daily as needed (shortness of breath).     FAMILY HISTORY:   Her family history is not on file.  SOCIAL HISTORY:  She  reports that she has quit smoking. She has never used smokeless tobacco. She reports that she does not drink alcohol or use drugs.  REVIEW OF SYSTEMS:    Headache.

## 2020-02-24 NOTE — Progress Notes (Addendum)
CRITICAL VALUE ALERT  Critical Value:  K 2.3, Mag 1.8  Date & Time Notied: 02/24/2020 1045  Provider Notified: Dr. Gwenlyn Found  Orders Received/Actions taken: 2g IV Mag; 40 mEq K-DUR; Repeat Mag/BMET tomorrow AM  Andreas Blower RN

## 2020-02-24 NOTE — Progress Notes (Signed)
Physical Therapy Treatment Patient Details Name: [name] MRN: [id number] DOB: 03/04/1946 Today's Date: 02/24/2020    2History of Present Illness 74 yo female presented with persistent HA, nausea and vomiting. pt with stable R lateral ventricle IVH. Pt with acute bil PE 4/13, IVC filter placed 4/14. PMHx: HTN, HLD, hep C, hypothyroidism    PT Comments    Pt sitting in chair on arrival reporting HA but no difficulty breathing on RA. Pt internally distracted by HA. Pt able to progress gait but limited by fatigue. Pt reports she feels very fatigued with heavy legs and HA but no other significant deficits. Pt educated for progressive mobility and continued plan for return home.  SpO2 97-100% on RA with gait 138/70 BP HR 97-108    Follow Up Recommendations  Home health PT;Supervision for mobility/OOB     Equipment Recommendations  None recommended by PT    Recommendations for Other Services       Precautions / Restrictions Precautions Precautions: Fall Precaution Comments: SBP <140    Mobility  Bed Mobility Overal bed mobility: Modified Independent             General bed mobility comments: mod I with supervision only for lines for sit to supine  Transfers Overall transfer level: Needs assistance   Transfers: Sit to/from Stand Sit to Stand: Min guard         General transfer comment: guarding or safety with cues for hand placement x 2 trials  Ambulation/Gait Ambulation/Gait assistance: Min guard Gait Distance (Feet): 100 Feet Assistive device: Rolling walker (2 wheeled) Gait Pattern/deviations: Step-through pattern;Decreased stride length   Gait velocity interpretation: 1.31 - 2.62 ft/sec, indicative of limited community ambulator General Gait Details: cues for proximity to RW. Pt directing distance but after 100' had to sit for grossly 3 min to recover due to fatigue prior to additional trial of 100'   Stairs             Wheelchair  Mobility    Modified Rankin (Stroke Patients Only)       Balance Overall balance assessment: Needs assistance   Sitting balance-Leahy Scale: Good     Standing balance support: During functional activity;Bilateral upper extremity supported   Standing balance comment: bil UE support on RW for gait                            Cognition Arousal/Alertness: Awake/alert Behavior During Therapy: WFL for tasks assessed/performed Overall Cognitive Status: Impaired/Different from baseline Area of Impairment: Memory                     Memory: Decreased short-term memory                Exercises      General Comments        Pertinent Vitals/Pain Pain Score: 6  Pain Location: HA Pain Descriptors / Indicators: Throbbing;Constant Pain Intervention(s): Limited activity within patient's tolerance;Repositioned    Home Living                      Prior Function            PT Goals (current goals can now be found in the care plan section) Progress towards PT goals: Progressing toward goals    Frequency    Min 3X/week      PT Plan Current plan remains appropriate    Co-evaluation  AM-PAC PT "6 Clicks" Mobility   Outcome Measure  Help needed turning from your back to your side while in a flat bed without using bedrails?: A Little Help needed moving from lying on your back to sitting on the side of a flat bed without using bedrails?: A Little Help needed moving to and from a bed to a chair (including a wheelchair)?: A Little Help needed standing up from a chair using your arms (e.g., wheelchair or bedside chair)?: A Little Help needed to walk in hospital room?: A Little Help needed climbing 3-5 steps with a railing? : A Lot 6 Click Score: 17    End of Session   Activity Tolerance: Patient limited by fatigue Patient left: in bed;with call bell/phone within reach Nurse Communication: Mobility status PT Visit  Diagnosis: Other abnormalities of gait and mobility (R26.89);Pain;Other symptoms and signs involving the nervous system (R29.898)     Time: [id number] PT Time Calculation (min) (ACUTE ONLY): 29 min  Charges:  $Gait Training: 8-22 mins $Therapeutic Activity: 8-22 mins                     [name] P, PT Acute Rehabilitation Services Pager: [phone] Office: [place] 02/24/2020, 1:59 PM

## 2020-02-24 NOTE — Anesthesia Preprocedure Evaluation (Addendum)
Anesthesia Evaluation  Patient identified by MRN, date of birth, ID band Patient awake    Reviewed: Allergy & Precautions, NPO status , Unable to perform ROS - Chart review onlyPreop documentation limited or incomplete due to emergent nature of procedure.  Airway Mallampati: II  TM Distance: >3 FB Neck ROM: Full    Dental no notable dental hx. (+) Teeth Intact, Dental Advisory Given   Pulmonary former smoker,    Pulmonary exam normal breath sounds clear to auscultation       Cardiovascular hypertension, Pt. on medications Normal cardiovascular exam Rhythm:Regular Rate:Normal  HLD    Neuro/Psych  Headaches, New onset HA- code stroke, no other deficits TIAnegative psych ROS   GI/Hepatic negative GI ROS, (+) Hepatitis -, C  Endo/Other  Hypothyroidism   Renal/GU negative Renal ROS  negative genitourinary   Musculoskeletal negative musculoskeletal ROS (+)   Abdominal Normal abdominal exam  (+)   Peds negative pediatric ROS (+)  Hematology  (+) Blood dyscrasia, , Polycythemia vera  Primary team suspecting HIT- plt 25   Anesthesia Other Findings right lateral ventricle ICB, JAK2 mutation and PE s/p IVC filter placement 02/22/20 in IR who originally presented to [facility] ED on 02/14/20 with complaints of headache. She was found to have an acute intraventricular hemorrhage within the right lateral ventricle most consistent with polycythemia vera. She was admitted for further evaluation - she underwent a bone marrow biopsy in IR on 4/6 which showed hypercellular bone marrow for age with pan myeloid proliferation. She has been followed closely by hematology and has undergone several rounds of therapeutic phlebotomy and pheresis as well as receiving hydroxyurea. She has continued to have headaches and has been followed by neurology as well for this. On 02/22/20 she began to experience dyspnea and CTA chest was obtained which showed acute  bilateral lower lobe PE - IR was asked to place an IVC filter which occurred without incident that same day. Today she began to experience worsening of her headache and a CT head was obtained which noted hyperdensity of the straight sinus suggesting deep cerebral venous sinus thrombosis.   Reproductive/Obstetrics negative OB ROS                          Anesthesia Physical Anesthesia Plan  ASA: IV and emergent  Anesthesia Plan: General   Post-op Pain Management:    Induction: Intravenous and Rapid sequence  PONV Risk Score and Plan: Treatment may vary due to age or medical condition  Airway Management Planned: Oral ETT  Additional Equipment: None  Intra-op Plan:   Post-operative Plan: Extubation in OR and Possible Post-op intubation/ventilation  Informed Consent: I have reviewed the patients History and Physical, chart, labs and discussed the procedure including the risks, benefits and alternatives for the proposed anesthesia with the patient or authorized representative who has indicated his/her understanding and acceptance.     Only emergency history available and Consent reviewed with POA  Plan Discussed with: CRNA  Anesthesia Plan Comments: (D/w family the possibility of postoperative ventilation if the procedure leads to any significant intracranial hemorrhage. )        Anesthesia Quick Evaluation

## 2020-02-24 NOTE — Anesthesia Procedure Notes (Signed)
Procedure Name: Intubation Date/Time: 02/24/2020 4:39 PM Performed by: Renato Shin, CRNA Pre-anesthesia Checklist: Patient identified, Emergency Drugs available, Suction available and Patient being monitored Patient Re-evaluated:Patient Re-evaluated prior to induction Oxygen Delivery Method: Circle system utilized Preoxygenation: Pre-oxygenation with 100% oxygen Induction Type: IV induction Ventilation: Mask ventilation without difficulty Laryngoscope Size: Miller and 2 Grade View: Grade II Tube type: Oral Tube size: 7.5 mm Number of attempts: 1 Airway Equipment and Method: Stylet Placement Confirmation: ETT inserted through vocal cords under direct vision,  positive ETCO2 and breath sounds checked- equal and bilateral Secured at: 21 cm Tube secured with: Tape Dental Injury: Teeth and Oropharynx as per pre-operative assessment

## 2020-02-24 NOTE — Progress Notes (Signed)
Progress Note  Patient Name: [name] Date of Encounter: 02/24/2020  Primary Cardiologist: Quay Burow, MD   Subjective   Only complaint this morning is continued headache which she has had for the last 10 to 12 days.  She denies chest pain or shortness of breath.  She did convert from A. fib with RVR to sinus rhythm sometime early this morning.  Inpatient Medications    Scheduled Meds: .  stroke: mapping our early stages of recovery book   Does not apply Once  . adenosine (ADENOCARD) IV  12 mg Intravenous STAT  . allopurinol  300 mg Oral Daily  . amiodarone  200 mg Oral BID  . amLODipine  10 mg Oral Daily  . chlorhexidine  15 mL Mouth Rinse BID  . Chlorhexidine Gluconate Cloth  6 each Topical Daily  . fluticasone furoate-vilanterol  1 puff Inhalation Daily  . hydrALAZINE  25 mg Oral TID  . hydroxyurea  1,000 mg Oral Daily  . levothyroxine  125 mcg Oral QAC breakfast  . losartan  50 mg Oral BID  . pantoprazole  40 mg Oral Daily  . predniSONE  20 mg Oral Q breakfast   Followed by  . [START ON 02/25/2020] predniSONE  10 mg Oral Q breakfast   Followed by  . [START ON 02/26/2020] predniSONE  5 mg Oral Q breakfast   Followed by  . [START ON 02/27/2020] predniSONE  5 mg Oral Q breakfast  . senna-docusate  1 tablet Oral BID  . sodium chloride flush  10-40 mL Intracatheter Q12H  . topiramate  25 mg Oral BID   Continuous Infusions: . diltiazem (CARDIZEM) infusion     PRN Meds: acetaminophen **OR** acetaminophen (TYLENOL) oral liquid 160 mg/5 mL **OR** acetaminophen, butalbital-acetaminophen-caffeine, labetalol, ondansetron (ZOFRAN) IV, sodium chloride flush   Vital Signs    Vitals:   02/24/20 0500 02/24/20 0600 02/24/20 0700 02/24/20 0750  BP: (!) 135/107 (!) 143/59 (!) 141/69   Pulse: 69 68 65   Resp: (!) 21 20 18    Temp:    98.3 F (36.8 C)  TempSrc:      SpO2: 96% 95% 95%   Weight:      Height:        Intake/Output Summary (Last 24 hours) at 02/24/2020  0840 Last data filed at 02/24/2020 0700 Gross per 24 hour  Intake 554.24 ml  Output 1330 ml  Net -775.76 ml   Last 3 Weights 02/15/2020 02/14/2020 02/13/2020  Weight (lbs) 146 lb 2.6 oz 159 lb 2.8 oz 155 lb  Weight (kg) 66.3 kg 72.2 kg 70.308 kg      Telemetry    Sinus rhythm- Personally Reviewed  ECG    Not performed today- Personally Reviewed  Physical Exam   GEN: No acute distress.   Neck: No JVD Cardiac: RRR, no murmurs, rubs, or gallops.  Respiratory: Clear to auscultation bilaterally. GI: Soft, nontender, non-distended  MS: No edema; No deformity. Neuro:  Nonfocal  Psych: Normal affect   Labs    High Sensitivity Troponin:  No results for input(s): TROPONINIHS in the last 720 hours.    Chemistry Recent Labs  Lab 02/20/20 0414 02/21/20 0403 02/22/20 0346  NA 138 140 139  K 3.4* 3.3* 3.3*  CL 109 113* 109  CO2 21* 20* 21*  GLUCOSE 146* 139* 123*  BUN 11 10 7*  CREATININE 0.81 0.72 0.68  CALCIUM 8.3* 8.2* 8.8*  PROT 4.5*  --   --   ALBUMIN 2.9*  --   --  AST 12*  --   --   ALT 12  --   --   ALKPHOS 34*  --   --   BILITOT 0.8  --   --   GFRNONAA >60 >60 >60  GFRAA >60 >60 >60  ANIONGAP 8 7 9      Hematology Recent Labs  Lab 02/20/20 0414 02/21/20 0403 02/22/20 0346  WBC 18.0* 19.5* 18.4*  RBC 4.36 4.26 4.46  HGB 13.3 12.9 13.4  HCT 39.5 38.4 40.4  MCV 90.6 90.1 90.6  MCH 30.5 30.3 30.0  MCHC 33.7 33.6 33.2  RDW 14.3 14.7 15.4  PLT 548* 324 160    BNPNo results for input(s): BNP, PROBNP in the last 168 hours.   DDimer No results for input(s): DDIMER in the last 168 hours.   Radiology    DG Chest 2 View  Result Date: 02/22/2020 CLINICAL DATA:  Shortness of breath EXAM: CHEST - 2 VIEW COMPARISON:  February 14, 2020 FINDINGS: There is atelectatic change in the right base. Lungs elsewhere are clear. Heart size and pulmonary vascularity are normal. No adenopathy. No bone lesions. IMPRESSION: Right base atelectasis. Lungs elsewhere clear. Cardiac  silhouette within normal limits. Electronically Signed   By: Lowella Grip III M.D.   On: 02/22/2020 09:20   CT ANGIO CHEST PE W OR WO CONTRAST  Addendum Date: 02/22/2020   ADDENDUM REPORT: 02/22/2020 12:39 ADDENDUM: Critical Value/emergent results were called by telephone at the time of interpretation on 02/22/2020 at 12:32 pm to provider Dr. Leonie Man, who verbally acknowledged these results. Electronically Signed   By: Marijo Conception M.D.   On: 02/22/2020 12:39   Result Date: 02/22/2020 CLINICAL DATA:  Shortness of breath. EXAM: CT ANGIOGRAPHY CHEST WITH CONTRAST TECHNIQUE: Multidetector CT imaging of the chest was performed using the standard protocol during bolus administration of intravenous contrast. Multiplanar CT image reconstructions and MIPs were obtained to evaluate the vascular anatomy. CONTRAST:  132mL OMNIPAQUE IOHEXOL 350 MG/ML SOLN COMPARISON:  None. FINDINGS: Cardiovascular: Filling defects are noted in the lower lobe branches of both pulmonary arteries consistent with acute pulmonary emboli. Normal cardiac size. No pericardial effusion. Coronary artery calcifications are noted. Atherosclerosis of thoracic aorta is noted without aneurysm formation. Mediastinum/Nodes: No enlarged mediastinal, hilar, or axillary lymph nodes. Thyroid gland, trachea, and esophagus demonstrate no significant findings. Lungs/Pleura: No pneumothorax or pleural effusion is noted. Emphysematous disease is noted in the upper lobes bilaterally. Nodular densities are noted laterally in the right lower lobe most consistent with atypical inflammation or other inflammation. Right middle lobe opacity is noted concerning for atelectasis or possibly inflammation. Upper Abdomen: Probable exophytic cyst seen arising from upper pole of left kidney. Musculoskeletal: No chest wall abnormality. No acute or significant osseous findings. Review of the MIP images confirms the above findings. IMPRESSION: 1. Acute bilateral lower lobe  pulmonary emboli are noted. 2. Coronary artery calcifications are noted suggesting coronary artery disease. 3. Emphysematous disease is noted in the upper lobes bilaterally. 4. Nodular densities are noted laterally in the right lower lobe most consistent with atypical inflammation or other inflammation. Right middle lobe opacity is noted concerning for atelectasis or possibly inflammation. Follow-up unenhanced chest CT in 3-4 weeks is recommended to ensure resolution and rule out neoplasm. Aortic Atherosclerosis (ICD10-I70.0) and Emphysema (ICD10-J43.9). Electronically Signed: By: Marijo Conception M.D. On: 02/22/2020 12:26   IR IVC FILTER PLMT / S&I Burke Keels GUID/MOD SED  Result Date: 02/22/2020 INDICATION: 74 year old with intraventricular hemorrhage, polycythemia vera and bilateral pulmonary  emboli. Patient is not a candidate for anticoagulation due to the intraventricular hemorrhage. Request for IVC filter placement. EXAM: IVC FILTER PLACEMENT; IVC VENOGRAM; ULTRASOUND FOR VASCULAR ACCESS Physician: Stephan Minister. Anselm Pancoast, MD MEDICATIONS: None. ANESTHESIA/SEDATION: Fentanyl 0.5 mcg IV; Versed 50 mg IV Moderate Sedation Time:  24 minutes minutes The patient was continuously monitored during the procedure by the interventional radiology nurse under my direct supervision. CONTRAST:  50 mL Omnipaque 300 FLUOROSCOPY TIME:  Fluoroscopy Time: 4 minutes, 18 seconds, 90 mGy COMPLICATIONS: None immediate. PROCEDURE: Informed consent was obtained for an IVC venogram and filter placement. Ultrasound demonstrated a patent right internal jugular vein. Ultrasound image obtained for documentation. The right side of the neck was prepped and draped in a sterile fashion. Maximal barrier sterile technique was utilized including caps, mask, sterile gowns, sterile gloves, sterile drape, hand hygiene and skin antiseptic. The skin was anesthetized with 1% lidocaine. A 21 gauge needle was directed into the vein with ultrasound guidance and a  micropuncture dilator set was placed. A wire was advanced into the IVC. The filter sheath was advanced over the wire into the IVC. An IVC venogram was performed. Five French catheter was used to cannulate the right renal vein with a Bentson wire. Fluoroscopic images were obtained for documentation. A Bard Denali filter was deployed below the lowest renal vein. A follow-up venogram was performed and the vascular sheath was removed with manual compression. FINDINGS: IVC was patent. Bilateral renal veins were identified. The filter was deployed below the lowest renal vein. Follow-up venogram confirmed placement within the IVC and below the renal veins. IMPRESSION: Successful placement of a retrievable IVC filter. PLAN: This IVC filter is potentially retrievable. The patient will be assessed for filter retrieval by Interventional Radiology in approximately 8-12 weeks. Further recommendations regarding filter retrieval, continued surveillance or declaration of device permanence, will be made at that time. Electronically Signed   By: Markus Daft M.D.   On: 02/22/2020 17:50   VAS [place] LOWER EXTREMITY VENOUS (DVT)  Result Date: 02/23/2020  Lower Venous DVTStudy Indications: Pulmonary embolism.  Limitations: Patient positioning. Comparison Study: no prior Performing Technologist: [name] RVS  Examination Guidelines: A complete evaluation includes B-mode imaging, spectral Doppler, color Doppler, and power Doppler as needed of all accessible portions of each vessel. Bilateral testing is considered an integral part of a complete examination. Limited examinations for reoccurring indications may be performed as noted. The reflux portion of the exam is performed with the patient in reverse Trendelenburg.  +---------+---------------+---------+-----------+----------+--------------+ RIGHT    CompressibilityPhasicitySpontaneityPropertiesThrombus Aging  +---------+---------------+---------+-----------+----------+--------------+ CFV      Full           Yes      Yes                                 +---------+---------------+---------+-----------+----------+--------------+ SFJ      Full                                                        +---------+---------------+---------+-----------+----------+--------------+ FV Prox  Full                                                        +---------+---------------+---------+-----------+----------+--------------+  FV Mid   Full                                                        +---------+---------------+---------+-----------+----------+--------------+ FV DistalFull                                                        +---------+---------------+---------+-----------+----------+--------------+ PFV      Full                                                        +---------+---------------+---------+-----------+----------+--------------+ POP      Full           Yes      Yes                                 +---------+---------------+---------+-----------+----------+--------------+ PTV      Full                                                        +---------+---------------+---------+-----------+----------+--------------+ PERO     Full                                                        +---------+---------------+---------+-----------+----------+--------------+   +---------+---------------+---------+-----------+----------+--------------+ LEFT     CompressibilityPhasicitySpontaneityPropertiesThrombus Aging +---------+---------------+---------+-----------+----------+--------------+ CFV      Full           Yes      Yes                                 +---------+---------------+---------+-----------+----------+--------------+ SFJ      Full                                                         +---------+---------------+---------+-----------+----------+--------------+ FV Prox  Full                                                        +---------+---------------+---------+-----------+----------+--------------+ FV Mid   Full                                                        +---------+---------------+---------+-----------+----------+--------------+  FV DistalFull                                                        +---------+---------------+---------+-----------+----------+--------------+ PFV      Full                                                        +---------+---------------+---------+-----------+----------+--------------+ POP      Full           Yes      Yes                                 +---------+---------------+---------+-----------+----------+--------------+ PTV      Full                                                        +---------+---------------+---------+-----------+----------+--------------+ PERO     Full                                                        +---------+---------------+---------+-----------+----------+--------------+     Summary: BILATERAL: - No evidence of deep vein thrombosis seen in the lower extremities, bilaterally.   *See table(s) above for measurements and observations. Electronically signed by Harold Barban MD on 02/23/2020 at 5:39:17 PM.    Final     Cardiac Studies   2D echocardiogram (02/14/2020)  IMPRESSIONS    1. Left ventricular ejection fraction, by estimation, is 65 to 70%. The  left ventricle has normal function. The left ventricle has no regional  wall motion abnormalities. Left ventricular diastolic parameters were  normal.  2. Right ventricular systolic function is normal. The right ventricular  size is normal. Tricuspid regurgitation signal is inadequate for assessing  PA pressure.  3. The mitral valve is grossly normal. No evidence of mitral valve  regurgitation. No  evidence of mitral stenosis.  4. The aortic valve is tricuspid. Aortic valve regurgitation is not  visualized. Mild aortic valve sclerosis is present, with no evidence of  aortic valve stenosis.  5. The inferior vena cava is normal in size with greater than 50%  respiratory variability, suggesting right atrial pressure of 3 mmHg.   Conclusion(s)/Recommendation(s): No intracardiac source of embolism  detected on this transthoracic study. A transesophageal echocardiogram is  recommended to exclude cardiac source of embolism if clinically indicated.  Patient Profile     SHEVA ODER is a 74 y.o. female with a hx of hypertension, hyperlipidemia history of cardiac issues or cardiac evaluation, who was admitted 02/14/2020 intraventricular hemorrhage in the setting of severe thrombocytosis, platelet count initially 990,000, hemoglobin initially 17.7, who is being seen today for the evaluation of rapid atrial fib/flutter .  Assessment & Plan    1: A. fib with RVR-converted to sinus rhythm earlier  this morning on IV amiodarone.  She has been transitioned to p.o. which we will load her with 200 mg p.o. twice daily for 1 week.  She is not a candidate for oral anticoagulation at this point because of her intracranial bleed.  2: Essential hypertension-blood pressure much higher after converting to sinus rhythm currently 169/69.  She is on high-dose amlodipine, hydralazine, losartan.  We may add beta-blocker for better blood pressure every control  3: Hypokalemia-she received 40 mEq of potassium yesterday.  We will recheck a basic metabolic panel and magnesium today.  4: Intraventricular hemorrhage-per neurology.  Patient has persistent headache but this precludes our ability to anticoagulate her.  If she remains in sinus rhythm today we will transfer back to 3 W. tomorrow and sign off.      For questions or updates, please contact [facility] Please consult [email or web address] for contact info  under        Signed, Quay Burow, MD  02/24/2020, 8:40 AM

## 2020-02-24 NOTE — Sedation Documentation (Signed)
4 gold rings given to pt's husband.

## 2020-02-24 NOTE — Transfer of Care (Signed)
Immediate Anesthesia Transfer of Care Note  Patient: [name]  Procedure(s) Performed: IR WITH ANESTHESIA (N/A )  Patient Location: ICU  Anesthesia Type:General  Level of Consciousness: lethargic and responds to stimulation  Airway & Oxygen Therapy: Patient Spontanous Breathing and Patient connected to face mask oxygen  Post-op Assessment: Report given to RN and Post -op Vital signs reviewed and stable  Post vital signs: Reviewed and stable  Last Vitals:  Vitals Value Taken Time  BP 101/66 02/24/20 2100  Temp    Pulse 79 02/24/20 2102  Resp    SpO2 100 % 02/24/20 2102  Vitals shown include unvalidated device data.  Last Pain:  Vitals:   02/24/20 0800  TempSrc:   PainSc: Asleep      Patients Stated Pain Goal: 0 ([id number])  Complications: No apparent anesthesia complications

## 2020-02-24 NOTE — Anesthesia Postprocedure Evaluation (Signed)
Anesthesia Post Note  Patient: SORIAH DENHOLM  Procedure(s) Performed: IR WITH ANESTHESIA (N/A )     Patient location during evaluation: SICU Anesthesia Type: General Level of consciousness: sedated and patient cooperative Pain management: pain level controlled Vital Signs Assessment: post-procedure vital signs reviewed and stable Respiratory status: spontaneous breathing, nonlabored ventilation, respiratory function stable and patient connected to nasal cannula oxygen Cardiovascular status: blood pressure returned to baseline and stable Postop Assessment: no apparent nausea or vomiting Anesthetic complications: no    Last Vitals:  Vitals:   02/24/20 1300 02/24/20 1400  BP: (!) 126/57 (!) 151/76  Pulse: 78 79  Resp: 20 18  Temp:    SpO2: 95% 97%    Last Pain:  Vitals:   02/24/20 0800  TempSrc:   PainSc: Asleep                 Emiah Pellicano,E. Kissie Ziolkowski

## 2020-02-24 NOTE — Progress Notes (Signed)
STOKE MD ADDENDUM  Stat Ct head obtained due to increasing headache shows hyderdensity of straight sinus suggesting likely deep cerebral venous sinus thrombosis. D?w neuro IR on call dr Norma Fredrickson. Will obtain sat CTV brain and proceed to interventional treatment later if needed  Antony Contras, MD

## 2020-02-24 NOTE — Progress Notes (Signed)
[place]  [phone] [phone]) [phone]     ID: [name] DOB: 1946/09/05  MR#: [id number]  VAN#:[id number]  Patient Care Team: Cari Caraway, MD as PCP - General (Family Medicine) Lorretta Harp, MD as PCP - Cardiology (Cardiology) [name], NP OTHER MD:  CHIEF COMPLAINT: Follow-up intracranial bleed, polycythemia  CURRENT TREATMENT: Pheresis, Hydrea, therapeutic phlebotomy  INTERVAL HISTORY: Events of the past few days have been noted.  Briefly, the patient developed shortness of breath and had a CT angiogram of the chest which showed acute bilateral lower lobe pulmonary emboli.  Given her recent intracranial bleed, anticoagulation was not given.  She had an IVC filter placed on 02/22/2020.  The patient was found to have A. fib with RVR on 02/23/2020.  Cardiology was consulted.  Adenosine has been given.  He is currently in the ICU on amiodarone.  From today shows a WBC of 10.2, hemoglobin 14.1 and platelet count down to 24,000 (this was repeated and confirmed).  Last platelet count was performed 2 days ago was 160,000.  She has been receiving hydroxyurea 1000 mg daily.  REVIEW OF SYSTEMS: [name] has no specific complaints today except for her ongoing headache.  This is unchanged.  She denies bleeding today  HISTORY OF CURRENT ILLNESS: From the original consult note:  [name] has a history of hypertension followed by Dr. Theadore Nan.  For the last few days she has had headache and she took her blood pressure and it was as high as 168/102 which is unusual for her. As this was not improving she presented to the emergency room last night 02/13/2020. Dr Roxanne Mins obtained a basic lab work and a head CT.  The head CT without contrast showed an acute intraventricular hemorrhage in the right lateral ventricle, with no parenchymal component.  The lab work showed a white cell count of 12.0, hemoglobin 17.7, and platelets 990,000.  We were consulted  for further evaluation and treatment.  PAST MEDICAL HISTORY: Past Medical History:  Diagnosis Date  . Hepatitis C   . Hyperlipidemia   . Hypertension   . Osteoporosis     PAST SURGICAL HISTORY: Past Surgical History:  Procedure Laterality Date  . IR IVC FILTER PLMT / S&I /IMG GUID/MOD SED  02/22/2020    FAMILY HISTORY History reviewed. No pertinent family history. The patient's father died at age 5 and the patient's mother at age 17 both from heart disease.  The patient has 1 sister, no brothers.  There is no family history of blood problems and no family history of cancer to the patient's knowledge  GYNECOLOGIC HISTORY:  No LMP recorded. Patient is postmenopausal. Menarche: 74 years old Age at first live birth: 74 years old [place] P 2 LMP 44 HRT yes, a few years  Hysterectomy?  Salpingo-oophorectomy?  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  Her husband [name] ran a business but is now retired.  Their son [name] is a Adult nurse.  Their son [name] is an Pharmacologist.  The patient has 1 grandson and 4 step grandchildren.  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    ADVANCED DIRECTIVES: In the absence of any documents to the contrary the patient's husband is her healthcare power of attorney   HEALTH MAINTENANCE: Social History   Tobacco Use  . Smoking status: Former Research scientist (life sciences)  . Smokeless tobacco: Never Used  Substance Use Topics  . Alcohol use: No  . Drug use: No  Colonoscopy: [facility]  PAP: [place]  Bone density:  Mammogram: Due   Allergies  Allergen Reactions  . Erythromycin Itching and Other (See Comments)    Severe stomach pains, diarrhea    Current Facility-Administered Medications  Medication Dose Route Frequency Provider Last Rate Last Admin  .  stroke: mapping our early stages of recovery book   Does not apply Once Barrett, Rhonda G, PA-C      . acetaminophen (TYLENOL) tablet 650 mg  650 mg Oral Q6H PRN Barrett, Rhonda G, PA-C   650  mg at 02/21/20 0435   Or  . acetaminophen (TYLENOL) 160 MG/5ML solution 650 mg  650 mg Per Tube Q6H PRN Barrett, Rhonda G, PA-C       Or  . acetaminophen (TYLENOL) suppository 650 mg  650 mg Rectal Q6H PRN Barrett, Rhonda G, PA-C      . adenosine (ADENOCARD) 12 MG/4ML injection 12 mg  12 mg Intravenous STAT Barrett, Rhonda G, PA-C      . allopurinol (ZYLOPRIM) tablet 300 mg  300 mg Oral Daily Barrett, Rhonda G, PA-C   300 mg at 02/24/20 0913  . amiodarone (PACERONE) tablet 200 mg  200 mg Oral BID Marianna Payment, MD   200 mg at 02/24/20 0912  . amLODipine (NORVASC) tablet 10 mg  10 mg Oral Daily Barrett, Rhonda G, PA-C   10 mg at 02/24/20 0906  . butalbital-acetaminophen-caffeine (FIORICET) 50-325-40 MG per tablet 1 tablet  1 tablet Oral Q8H PRN Barrett, Evelene Croon, PA-C   1 tablet at 02/24/20 0517  . chlorhexidine (PERIDEX) 0.12 % solution 15 mL  15 mL Mouth Rinse BID Barrett, Rhonda G, PA-C   15 mL at 02/23/20 2112  . Chlorhexidine Gluconate Cloth 2 % PADS 6 each  6 each Topical Daily Garvin Fila, MD   6 each at 02/24/20 1001  . diltiazem (CARDIZEM) 125 mg in dextrose 5% 125 mL (1 mg/mL) infusion  5-15 mg/hr Intravenous Titrated Barrett, Rhonda G, PA-C      . fluticasone furoate-vilanterol (BREO ELLIPTA) 200-25 MCG/INH 1 puff  1 puff Inhalation Daily Barrett, Rhonda G, PA-C      . hydrALAZINE (APRESOLINE) tablet 25 mg  25 mg Oral TID Barrett, Rhonda G, PA-C   25 mg at 02/24/20 0912  . hydroxyurea (HYDREA) capsule 1,000 mg  1,000 mg Oral Daily Barrett, Rhonda G, PA-C   1,000 mg at 02/24/20 0912  . labetalol (NORMODYNE) injection 10-20 mg  10-20 mg Intravenous Q2H PRN Barrett, Evelene Croon, PA-C   20 mg at 02/19/20 2138  . levothyroxine (SYNTHROID) tablet 125 mcg  125 mcg Oral QAC breakfast Barrett, Evelene Croon, PA-C   125 mcg at 02/24/20 0641  . losartan (COZAAR) tablet 50 mg  50 mg Oral BID Barrett, Rhonda G, PA-C   50 mg at 02/24/20 0912  . magnesium sulfate IVPB 2 g 50 mL  2 g Intravenous Once Lorretta Harp, MD 50 mL/hr at 02/24/20 1156 2 g at 02/24/20 1156  . ondansetron (ZOFRAN) injection 4 mg  4 mg Intravenous Q4H PRN Barrett, Rhonda G, PA-C   4 mg at 02/18/20 1334  . pantoprazole (PROTONIX) EC tablet 40 mg  40 mg Oral Daily Barrett, Rhonda G, PA-C   40 mg at 02/24/20 0912  . [START ON 02/25/2020] predniSONE (DELTASONE) tablet 10 mg  10 mg Oral Q breakfast Barrett, Rhonda G, PA-C       Followed by  . [START ON 02/26/2020] predniSONE (DELTASONE) tablet 5 mg  5 mg Oral Q breakfast Barrett, Rhonda G, PA-C       Followed by  . [START ON 02/27/2020] predniSONE (DELTASONE) tablet 5 mg  5 mg Oral Q breakfast Barrett, Rhonda G, PA-C      . senna-docusate (Senokot-S) tablet 1 tablet  1 tablet Oral BID Barrett, Evelene Croon, PA-C   1 tablet at 02/23/20 2106  . sodium chloride flush (NS) 0.9 % injection 10-40 mL  10-40 mL Intracatheter Q12H Barrett, Rhonda G, PA-C   10 mL at 02/23/20 1028  . sodium chloride flush (NS) 0.9 % injection 10-40 mL  10-40 mL Intracatheter PRN Barrett, Rhonda G, PA-C      . topiramate (TOPAMAX) tablet 50 mg  50 mg Oral BID Garvin Fila, MD        OBJECTIVE: .white woman examined in bed  Vitals:   02/24/20 1130 02/24/20 1136  BP: 139/68   Pulse: 76   Resp: 20   Temp:  (!) 97.4 F (36.3 C)  SpO2: 96%      Body mass index is 26.73 kg/m.   Wt Readings from Last 3 Encounters:  02/15/20 66.3 kg  08/15/14 67.6 kg    LAB RESULTS:  CMP     Component Value Date/Time   NA 140 02/24/2020 0920   K 2.3 (LL) 02/24/2020 0920   CL 106 02/24/2020 0920   CO2 21 (L) 02/24/2020 0920   GLUCOSE 143 (H) 02/24/2020 0920   BUN 6 (L) 02/24/2020 0920   CREATININE 0.73 02/24/2020 0920   CALCIUM 8.5 (L) 02/24/2020 0920   PROT 4.5 (L) 02/20/2020 0414   ALBUMIN 2.9 (L) 02/20/2020 0414   AST 12 (L) 02/20/2020 0414   ALT 12 02/20/2020 0414   ALKPHOS 34 (L) 02/20/2020 0414   BILITOT 0.8 02/20/2020 0414   GFRNONAA >60 02/24/2020 0920   GFRAA >60 02/24/2020 0920    No results  found for: TOTALPROTELP, ALBUMINELP, A1GS, A2GS, BETS, BETA2SER, GAMS, MSPIKE, SPEI  No results found for: KPAFRELGTCHN, LAMBDASER, KAPLAMBRATIO  Lab Results  Component Value Date   WBC 18.4 (H) 02/22/2020   NEUTROABS 16.2 (H) 02/22/2020   HGB 13.4 02/22/2020   HCT 40.4 02/22/2020   MCV 90.6 02/22/2020   PLT 160 02/22/2020    '@LASTCHEMISTRY' @  No results found for: LABCA2  No components found for: [id number]  No results for input(s): INR in the last 168 hours.  No results found for: LABCA2  No results found for: KCL275  No results found for: TZG017  No results found for: CBS496  No results found for: CA2729  No components found for: HGQUANT  No results found for: CEA1 / No results found for: CEA1   No results found for: AFPTUMOR  No results found for: CHROMOGRNA  No results found for: PSA1  No results displayed because visit has over 200 results.      (this displays the last labs from the last 3 days)  No results found for: TOTALPROTELP, ALBUMINELP, A1GS, A2GS, BETS, BETA2SER, GAMS, MSPIKE, SPEI (this displays SPEP labs)  No results found for: KPAFRELGTCHN, LAMBDASER, KAPLAMBRATIO (kappa/lambda light chains)  No results found for: HGBA, HGBA2QUANT, HGBFQUANT, HGBSQUAN (Hemoglobinopathy evaluation)   No results found for: LDH  Lab Results  Component Value Date   IRON 73 02/14/2020   TIBC 358 02/14/2020   IRONPCTSAT 20 02/14/2020   (Iron and TIBC)  Lab Results  Component Value Date   FERRITIN 75 02/14/2020    Urinalysis    Component Value Date/Time  COLORURINE YELLOW 02/19/2020 1623   APPEARANCEUR CLEAR 02/19/2020 1623   LABSPEC 1.015 02/19/2020 1623   PHURINE 6.0 02/19/2020 1623   GLUCOSEU NEGATIVE 02/19/2020 1623   HGBUR NEGATIVE 02/19/2020 1623   BILIRUBINUR NEGATIVE 02/19/2020 1623   KETONESUR 20 (A) 02/19/2020 1623   PROTEINUR NEGATIVE 02/19/2020 1623   UROBILINOGEN 0.2 08/15/2014 2129   NITRITE NEGATIVE 02/19/2020 1623    LEUKOCYTESUR NEGATIVE 02/19/2020 1623     STUDIES: CT ANGIO HEAD W OR WO CONTRAST  Result Date: 02/19/2020 CLINICAL DATA:  Intraventricular hemorrhage EXAM: CT ANGIOGRAPHY HEAD TECHNIQUE: Multidetector CT imaging of the head was performed using the standard protocol during bolus administration of intravenous contrast. Multiplanar CT image reconstructions and MIPs were obtained to evaluate the vascular anatomy. CONTRAST:  68m OMNIPAQUE IOHEXOL 350 MG/ML SOLN COMPARISON:  None. FINDINGS: Artifact is present. CTA HEAD Anterior circulation: Intracranial internal carotid arteries are patent with mild calcified plaque. Anterior and middle cerebral arteries are patent. Posterior circulation: Intracranial vertebral arteries, basilar artery, and posterior cerebral arteries are patent. Venous sinuses: Not opacified. IMPRESSION: Suboptimal evaluation due to artifact. Patent anterior and posterior circulations. No abnormal vascularity identified. Electronically Signed   By: PMacy MisM.D.   On: 02/19/2020 13:09   DG Chest 2 View  Result Date: 02/22/2020 CLINICAL DATA:  Shortness of breath EXAM: CHEST - 2 VIEW COMPARISON:  February 14, 2020 FINDINGS: There is atelectatic change in the right base. Lungs elsewhere are clear. Heart size and pulmonary vascularity are normal. No adenopathy. No bone lesions. IMPRESSION: Right base atelectasis. Lungs elsewhere clear. Cardiac silhouette within normal limits. Electronically Signed   By: WLowella GripIII M.D.   On: 02/22/2020 09:20   CT HEAD WO CONTRAST  Result Date: 02/19/2020 CLINICAL DATA:  74year old female who presented with right side intraventricular hemorrhage on 02/14/2020. Altered mental status. History of polycythemia. Subsequent encounter. EXAM: CT HEAD WITHOUT CONTRAST TECHNIQUE: Contiguous axial images were obtained from the base of the skull through the vertex without intravenous contrast. COMPARISON:  CTA head and neck earlier today. Head CT  yesterday, and earlier. FINDINGS: Brain: Continued stable volume of right lateral intraventricular hemorrhage. Small volume of layering hemorrhage in the contralateral left occipital horn is stable. No other intraventricular blood at this time. No ventriculomegaly. Pronounced bilateral cerebral white matter hypodensity is stable. No significant intracranial mass effect. Stable gray-white matter differentiation throughout the brain. No cortically based acute infarct identified. Vascular: Stable. Mild Calcified atherosclerosis at the skull base. Skull: Stable, negative. Sinuses/Orbits: Visualized paranasal sinuses and mastoids are stable and well pneumatized. Other: No acute orbit or scalp soft tissue finding. IMPRESSION: 1. Stable since 02/18/2020. Unchanged volume of IVH primarily in the right lateral ventricle. No intracranial mass effect. 2. Severe cerebral white matter disease. Numerous chronic microhemorrhages throughout the brain also demonstrated recently by MRI. Electronically Signed   By: HGenevie AnnM.D.   On: 02/19/2020 20:39   CT HEAD WO CONTRAST  Result Date: 02/18/2020 CLINICAL DATA:  74year old female who presented with right side intraventricular hemorrhage on 02/14/2020. History of polycythemia. Subsequent encounter. EXAM: CT HEAD WITHOUT CONTRAST TECHNIQUE: Contiguous axial images were obtained from the base of the skull through the vertex without intravenous contrast. COMPARISON:  Head CT 02/16/2020 and earlier. FINDINGS: Brain: Continued stable appearance of right lateral intraventricular hemorrhage. Since 02/16/2020 a small volume of 4th ventricular blood has cleared. No significant 3rd ventricular blood is evident. Layering blood in the left occipital horn is stable. Stable ventricle size and configuration. No  definite transependymal edema. No parenchymal extension of blood or new intracranial hemorrhage identified. Confluent bilateral cerebral white matter hypodensity is stable. No midline  shift. Basilar cisterns remain patent. No cortically based acute infarct identified. Vascular: Calcified atherosclerosis at the skull base. Conspicuous increased vascular density probably reflects known polycythemia. Skull: Stable, negative. Sinuses/Orbits: Stable sinus aeration. Other: Visualized orbits and scalp soft tissues are within normal limits. IMPRESSION: 1. Unchanged size and configuration of right lateral intraventricular hemorrhage since 02/16/2020. Small volume of intraventricular hemorrhage elsewhere has regressed. No ventriculomegaly or transependymal edema. 2. No new intracranial abnormality. 3. Advanced white matter disease, and numerous chronic microhemorrhages throughout the brain demonstrated on recent MRI. Electronically Signed   By: Genevie Ann M.D.   On: 02/18/2020 20:11   CT HEAD WO CONTRAST  Result Date: 02/16/2020 CLINICAL DATA:  Intracranial hemorrhage, follow-up EXAM: CT HEAD WITHOUT CONTRAST TECHNIQUE: Contiguous axial images were obtained from the base of the skull through the vertex without intravenous contrast. COMPARISON:  02/14/2020 FINDINGS: Brain: Intraventricular hemorrhage is again identified primarily within the right lateral ventricle. There is no new hemorrhage. Caliber of the ventricles is similar. No new loss of gray differentiation. Confluent hypoattenuation is again identified supratentorial white matter. Vascular: No new finding. Skull: Remains unremarkable. Sinuses/Orbits: No acute finding. Other: None. IMPRESSION: No substantial change in intraventricular hemorrhage primarily within the right lateral ventricle. No new hemorrhage. Stable caliber of the ventricles. Electronically Signed   By: Macy Mis M.D.   On: 02/16/2020 07:09   CT HEAD WO CONTRAST  Result Date: 02/14/2020 CLINICAL DATA:  Follow-up hemorrhage EXAM: CT HEAD WITHOUT CONTRAST TECHNIQUE: Contiguous axial images were obtained from the base of the skull through the vertex without intravenous  contrast. COMPARISON:  Earlier same day, 8 hours ago. FINDINGS: Brain: Intraventricular hemorrhage within the right lateral ventricle persists, approximately the same volume without evidence of ongoing or additional bleeding. Small amount of blood present within the fourth ventricle and left lateral ventricle. Small amount of subarachnoid blood visible within a left parietal sulcus. No overall change in ventricular size. Widespread chronic microangiopathic change of the white matter as seen previously. Old small vessel infarction in the right basal ganglia. No sign of acute infarction. I do not identify an intraparenchymal source. Vascular: There is atherosclerotic calcification of the major vessels at the base of the brain. Skull: Negative Sinuses/Orbits: Clear/normal Other: None IMPRESSION: No significant change since 8 hours ago. Intraventricular hemorrhage primarily within the right lateral ventricle. No evidence of ongoing bleeding. Small amount of blood dependent within the left lateral ventricle and within the fourth ventricle. Ventricular size is stable. Small amount of subarachnoid blood evident in a left parietal sulcus. Electronically Signed   By: Nelson Chimes M.D.   On: 02/14/2020 08:57   CT Head Wo Contrast  Result Date: 02/14/2020 CLINICAL DATA:  Acute onset headache EXAM: CT HEAD WITHOUT CONTRAST TECHNIQUE: Contiguous axial images were obtained from the base of the skull through the vertex without intravenous contrast. COMPARISON:  None. FINDINGS: Brain: There is acute hemorrhage within the right lateral ventricle. No definite intraparenchymal component is identified. There is severe chronic white matter disease, most commonly indicating chronic ischemic microangiopathy. Volume of CSF spaces is normal. No midline shift or other mass effect Vascular: No abnormal hyperdensity of the major intracranial arteries or dural venous sinuses. No intracranial atherosclerosis. Skull: The visualized skull base,  calvarium and extracranial soft tissues are normal. Sinuses/Orbits: No fluid levels or advanced mucosal thickening of the visualized paranasal sinuses.  No mastoid or middle ear effusion. The orbits are normal. IMPRESSION: 1. Acute intraventricular hemorrhage within the right lateral ventricle. No definite parenchymal component identified. This is an unusual pattern, but is perhaps less unlikely in the setting of this patient's abnormal coagulation status. 2. Severe chronic ischemic microangiopathy. Critical Value/emergent results were called by telephone at the time of interpretation on 02/14/2020 at 12:31 am to provider [facility] , who verbally acknowledged these results. Electronically Signed   By: Ulyses Jarred M.D.   On: 02/14/2020 00:32   CT ANGIO CHEST PE W OR WO CONTRAST  Addendum Date: 02/22/2020   ADDENDUM REPORT: 02/22/2020 12:39 ADDENDUM: Critical Value/emergent results were called by telephone at the time of interpretation on 02/22/2020 at 12:32 pm to provider Dr. Leonie Man, who verbally acknowledged these results. Electronically Signed   By: Marijo Conception M.D.   On: 02/22/2020 12:39   Result Date: 02/22/2020 CLINICAL DATA:  Shortness of breath. EXAM: CT ANGIOGRAPHY CHEST WITH CONTRAST TECHNIQUE: Multidetector CT imaging of the chest was performed using the standard protocol during bolus administration of intravenous contrast. Multiplanar CT image reconstructions and MIPs were obtained to evaluate the vascular anatomy. CONTRAST:  163m OMNIPAQUE IOHEXOL 350 MG/ML SOLN COMPARISON:  None. FINDINGS: Cardiovascular: Filling defects are noted in the lower lobe branches of both pulmonary arteries consistent with acute pulmonary emboli. Normal cardiac size. No pericardial effusion. Coronary artery calcifications are noted. Atherosclerosis of thoracic aorta is noted without aneurysm formation. Mediastinum/Nodes: No enlarged mediastinal, hilar, or axillary lymph nodes. Thyroid gland, trachea, and esophagus  demonstrate no significant findings. Lungs/Pleura: No pneumothorax or pleural effusion is noted. Emphysematous disease is noted in the upper lobes bilaterally. Nodular densities are noted laterally in the right lower lobe most consistent with atypical inflammation or other inflammation. Right middle lobe opacity is noted concerning for atelectasis or possibly inflammation. Upper Abdomen: Probable exophytic cyst seen arising from upper pole of left kidney. Musculoskeletal: No chest wall abnormality. No acute or significant osseous findings. Review of the MIP images confirms the above findings. IMPRESSION: 1. Acute bilateral lower lobe pulmonary emboli are noted. 2. Coronary artery calcifications are noted suggesting coronary artery disease. 3. Emphysematous disease is noted in the upper lobes bilaterally. 4. Nodular densities are noted laterally in the right lower lobe most consistent with atypical inflammation or other inflammation. Right middle lobe opacity is noted concerning for atelectasis or possibly inflammation. Follow-up unenhanced chest CT in 3-4 weeks is recommended to ensure resolution and rule out neoplasm. Aortic Atherosclerosis (ICD10-I70.0) and Emphysema (ICD10-J43.9). Electronically Signed: By: JMarijo ConceptionM.D. On: 02/22/2020 12:26   MR ANGIO HEAD WO CONTRAST  Result Date: 02/14/2020 CLINICAL DATA:  Follow-up cerebral hemorrhage. EXAM: MRI HEAD WITHOUT CONTRAST MRA HEAD WITHOUT CONTRAST TECHNIQUE: Multiplanar, multiecho pulse sequences of the brain and surrounding structures were obtained without intravenous contrast. Angiographic images of the head were obtained using MRA technique without contrast. COMPARISON:  Head CT February 14, 2020 6 FINDINGS: MRI HEAD FINDINGS Brain: Redemonstrated right intraventricular hemorrhage with extension into the third and fourth ventricle as well as the occipital horn of left lateral ventricle. The ventricular size remains stable. No midline shift. A loculated  fluid collection measuring approximately 1 cm with hematocrit level is noted in the left parietal region at midline (series 11, image 23). Extensive confluent foci of T2 hyperintensity are seen within the white matter of the cerebral hemispheres, basal ganglia, thalami and pons, nonspecific. Scattered punctate foci of susceptibility artifact are seen the bilateral cerebral  hemispheres predominantly in subcortical location, bilateral thalami, pons and cerebellar hemispheres, likely representing hemosiderin deposit. This appears progressed since prior MRI. Vascular: Normal flow voids. Skull and upper cervical spine: Normal marrow signal. Sinuses/Orbits: Mild mucosal thickening of the right maxillary sinus. The orbits are maintained. Other: None. MRA HEAD FINDINGS The visualized portions of the distal cervical and intracranial internal carotid arteries are widely patent with normal flow related enhancement. The bilateral anterior cerebral arteries and middle cerebral arteries are widely patent with antegrade flow without high-grade flow-limiting stenosis or proximal branch occlusion. No intracranial aneurysm within the anterior circulation. The vertebral arteries are widely patent with antegrade flow. The posterior inferior cerebral arteries are normal. Redemonstrated is a fenestration of the bilateral A1 segments. Vertebrobasilar junction and basilar artery are widely patent with antegrade flow without evidence of basilar stenosis or aneurysm. Posterior cerebral arteries are normal bilaterally. No intracranial aneurysm within the posterior circulation. IMPRESSION: 1. Stable right intraventricular hemorrhage with extension into the third and fourth ventricle. Stable ventricular size. No evidence of hydrocephalus. 2. Extensive confluent T2 hyperintensity within the white matter of the cerebral hemispheres, basal ganglia, thalami, and pons, nonspecific but may be related to chronic small vessel ischemic changes. 3.  Multiple foci of susceptibility artifact throughout the brain parenchyma, progressed from prior MRI. These may be related to microangiopathy versus amyloid angiopathy. Electronically Signed   By: Pedro Earls M.D.   On: 02/14/2020 14:54   MR BRAIN WO CONTRAST  Result Date: 02/14/2020 CLINICAL DATA:  Follow-up cerebral hemorrhage. EXAM: MRI HEAD WITHOUT CONTRAST MRA HEAD WITHOUT CONTRAST TECHNIQUE: Multiplanar, multiecho pulse sequences of the brain and surrounding structures were obtained without intravenous contrast. Angiographic images of the head were obtained using MRA technique without contrast. COMPARISON:  Head CT February 14, 2020 6 FINDINGS: MRI HEAD FINDINGS Brain: Redemonstrated right intraventricular hemorrhage with extension into the third and fourth ventricle as well as the occipital horn of left lateral ventricle. The ventricular size remains stable. No midline shift. A loculated fluid collection measuring approximately 1 cm with hematocrit level is noted in the left parietal region at midline (series 11, image 23). Extensive confluent foci of T2 hyperintensity are seen within the white matter of the cerebral hemispheres, basal ganglia, thalami and pons, nonspecific. Scattered punctate foci of susceptibility artifact are seen the bilateral cerebral hemispheres predominantly in subcortical location, bilateral thalami, pons and cerebellar hemispheres, likely representing hemosiderin deposit. This appears progressed since prior MRI. Vascular: Normal flow voids. Skull and upper cervical spine: Normal marrow signal. Sinuses/Orbits: Mild mucosal thickening of the right maxillary sinus. The orbits are maintained. Other: None. MRA HEAD FINDINGS The visualized portions of the distal cervical and intracranial internal carotid arteries are widely patent with normal flow related enhancement. The bilateral anterior cerebral arteries and middle cerebral arteries are widely patent with antegrade flow  without high-grade flow-limiting stenosis or proximal branch occlusion. No intracranial aneurysm within the anterior circulation. The vertebral arteries are widely patent with antegrade flow. The posterior inferior cerebral arteries are normal. Redemonstrated is a fenestration of the bilateral A1 segments. Vertebrobasilar junction and basilar artery are widely patent with antegrade flow without evidence of basilar stenosis or aneurysm. Posterior cerebral arteries are normal bilaterally. No intracranial aneurysm within the posterior circulation. IMPRESSION: 1. Stable right intraventricular hemorrhage with extension into the third and fourth ventricle. Stable ventricular size. No evidence of hydrocephalus. 2. Extensive confluent T2 hyperintensity within the white matter of the cerebral hemispheres, basal ganglia, thalami, and pons, nonspecific but may  be related to chronic small vessel ischemic changes. 3. Multiple foci of susceptibility artifact throughout the brain parenchyma, progressed from prior MRI. These may be related to microangiopathy versus amyloid angiopathy. Electronically Signed   By: Pedro Earls M.D.   On: 02/14/2020 14:54   IR IVC FILTER PLMT / S&I Burke Keels GUID/MOD SED  Result Date: 02/22/2020 INDICATION: 74 year old with intraventricular hemorrhage, polycythemia vera and bilateral pulmonary emboli. Patient is not a candidate for anticoagulation due to the intraventricular hemorrhage. Request for IVC filter placement. EXAM: IVC FILTER PLACEMENT; IVC VENOGRAM; ULTRASOUND FOR VASCULAR ACCESS Physician: Stephan Minister. Anselm Pancoast, MD MEDICATIONS: None. ANESTHESIA/SEDATION: Fentanyl 0.5 mcg IV; Versed 50 mg IV Moderate Sedation Time:  24 minutes minutes The patient was continuously monitored during the procedure by the interventional radiology nurse under my direct supervision. CONTRAST:  50 mL Omnipaque 300 FLUOROSCOPY TIME:  Fluoroscopy Time: 4 minutes, 18 seconds, 90 mGy COMPLICATIONS: None  immediate. PROCEDURE: Informed consent was obtained for an IVC venogram and filter placement. Ultrasound demonstrated a patent right internal jugular vein. Ultrasound image obtained for documentation. The right side of the neck was prepped and draped in a sterile fashion. Maximal barrier sterile technique was utilized including caps, mask, sterile gowns, sterile gloves, sterile drape, hand hygiene and skin antiseptic. The skin was anesthetized with 1% lidocaine. A 21 gauge needle was directed into the vein with ultrasound guidance and a micropuncture dilator set was placed. A wire was advanced into the IVC. The filter sheath was advanced over the wire into the IVC. An IVC venogram was performed. Five French catheter was used to cannulate the right renal vein with a Bentson wire. Fluoroscopic images were obtained for documentation. A Bard Denali filter was deployed below the lowest renal vein. A follow-up venogram was performed and the vascular sheath was removed with manual compression. FINDINGS: IVC was patent. Bilateral renal veins were identified. The filter was deployed below the lowest renal vein. Follow-up venogram confirmed placement within the IVC and below the renal veins. IMPRESSION: Successful placement of a retrievable IVC filter. PLAN: This IVC filter is potentially retrievable. The patient will be assessed for filter retrieval by Interventional Radiology in approximately 8-12 weeks. Further recommendations regarding filter retrieval, continued surveillance or declaration of device permanence, will be made at that time. Electronically Signed   By: Markus Daft M.D.   On: 02/22/2020 17:50   DG CHEST PORT 1 VIEW  Result Date: 02/14/2020 CLINICAL DATA:  Status post PICC placement EXAM: PORTABLE CHEST 1 VIEW COMPARISON:  August 15, 2014 FINDINGS: The heart size and mediastinal contours are within normal limits. Aortic knob calcifications are seen. A right-sided central venous catheter seen with the tip at  the superior cavoatrial junction. No pneumothorax. No pleural effusion. No acute osseous abnormality. IMPRESSION: Interval placement of right-sided central venous catheter with the tip at the superior cavoatrial junction. Electronically Signed   By: Prudencio Pair M.D.   On: 02/14/2020 13:09   EEG adult  Result Date: 02/19/2020 Lora Havens, MD     02/19/2020  4:18 PM Patient Name: BRYTTANY TORTORELLI MRN: [id number] Epilepsy Attending: Lora Havens Referring Physician/Provider: Dr Antony Contras Date: 02/19/2020 Duration: 25.15 mins Patient history: 74yo F with right IVH. EEG to evaluate for seizure. Level of alertness: lethargic AEDs during EEG study: None Technical aspects: This EEG study was done with scalp electrodes positioned according to the 10-20 International system of electrode placement. Electrical activity was acquired at a sampling rate of '500Hz'  and reviewed  with a high frequency filter of '70Hz'  and a low frequency filter of '1Hz' . EEG data were recorded continuously and digitally stored. DESCRIPTION: EEG showed continuous generalized 3-'6Hz'  theta-delta slowing. Triphasic waves, generalized, maximal bifrontal were also noted. Hyperventilation and photic stimulation were not performed. ABNORMALITY - Continuous slow, generalized - Triphasics waves, generalized IMPRESSION: This study is suggestive of moderate diffuse encephalopathy, non specific to etiology but could be secondary to toxic-metabolic causes. No seizures or epileptiform discharges were seen throughout the recording. Priyanka Barbra Sarks   CT BONE MARROW BIOPSY & ASPIRATION  Result Date: 02/15/2020 CLINICAL DATA:  Polycythemia EXAM: CT GUIDED DEEP ILIAC BONE ASPIRATION AND CORE BIOPSY TECHNIQUE: Patient was placed prone on the CT gantry and limited axial scans through the pelvis were obtained. Appropriate skin entry site was identified. Skin site was marked, prepped with chlorhexidine, draped in usual sterile fashion, and infiltrated locally  with 1% lidocaine. Intravenous Fentanyl 66mg and Versed 161mwere administered as conscious sedation during continuous monitoring of the patient's level of consciousness and physiological / cardiorespiratory status by the radiology RN, with a total moderate sedation time of 12 minutes. Under CT fluoroscopic guidance an 11-gauge Cook trocar bone needle was advanced into the right iliac bone just lateral to the sacroiliac joint. Once needle tip position was confirmed, core and aspiration samples were obtained, submitted to pathology for approval. Post procedure scans show no hematoma or fracture. Patient tolerated procedure well. COMPLICATIONS: COMPLICATIONS none IMPRESSION: 1. Technically successful CT guided right iliac bone core and aspiration biopsy. Electronically Signed   By: D Lucrezia Europe.D.   On: 02/15/2020 15:11   ECHOCARDIOGRAM COMPLETE  Result Date: 02/14/2020    ECHOCARDIOGRAM REPORT   Patient Name:   CADOROTHIA PASSMOREate of Exam: 02/14/2020 Medical Rec #:  [id number]      Height:       62.0 in Accession #:    [id number]     Weight:       155.0 lb Date of Birth:  10May 12, 1947     BSA:          1.715 m Patient Age:    89ears         BP:           121/56 mmHg Patient Gender: F                HR:           90 bpm. Exam Location:  Inpatient Procedure: 2D Echo Indications:    stroke 434.91  History:        Patient has prior history of Echocardiogram examinations, most                 recent 08/16/2014. Risk Factors:Hypertension and Dyslipidemia.  Sonographer:    LaJohny Chesseferring Phys: [id number] IMPRESSIONS  1. Left ventricular ejection fraction, by estimation, is 65 to 70%. The left ventricle has normal function. The left ventricle has no regional wall motion abnormalities. Left ventricular diastolic parameters were normal.  2. Right ventricular systolic function is normal. The right ventricular size is normal. Tricuspid regurgitation signal is inadequate for assessing PA pressure.  3.  The mitral valve is grossly normal. No evidence of mitral valve regurgitation. No evidence of mitral stenosis.  4. The aortic valve is tricuspid. Aortic valve regurgitation is not visualized. Mild aortic valve sclerosis is present, with no evidence of aortic valve stenosis.  5. The inferior vena cava is  normal in size with greater than 50% respiratory variability, suggesting right atrial pressure of 3 mmHg. Conclusion(s)/Recommendation(s): No intracardiac source of embolism detected on this transthoracic study. A transesophageal echocardiogram is recommended to exclude cardiac source of embolism if clinically indicated. FINDINGS  Left Ventricle: Left ventricular ejection fraction, by estimation, is 65 to 70%. The left ventricle has normal function. The left ventricle has no regional wall motion abnormalities. The left ventricular internal cavity size was normal in size. There is  no left ventricular hypertrophy. Left ventricular diastolic parameters were normal. Right Ventricle: The right ventricular size is normal. No increase in right ventricular wall thickness. Right ventricular systolic function is normal. Tricuspid regurgitation signal is inadequate for assessing PA pressure. Left Atrium: Left atrial size was normal in size. Right Atrium: Right atrial size was normal in size. Pericardium: There is no evidence of pericardial effusion. Presence of pericardial fat pad. Mitral Valve: The mitral valve is grossly normal. Mild mitral annular calcification. No evidence of mitral valve regurgitation. No evidence of mitral valve stenosis. Tricuspid Valve: The tricuspid valve is grossly normal. Tricuspid valve regurgitation is not demonstrated. No evidence of tricuspid stenosis. Aortic Valve: The aortic valve is tricuspid. Aortic valve regurgitation is not visualized. Mild aortic valve sclerosis is present, with no evidence of aortic valve stenosis. Pulmonic Valve: The pulmonic valve was grossly normal. Pulmonic valve  regurgitation is not visualized. No evidence of pulmonic stenosis. Aorta: The aortic root is normal in size and structure. Venous: The inferior vena cava is normal in size with greater than 50% respiratory variability, suggesting right atrial pressure of 3 mmHg. IAS/Shunts: The atrial septum is grossly normal.  LEFT VENTRICLE PLAX 2D LVIDd:         4.40 cm  Diastology LVIDs:         2.70 cm  LV e' lateral:   9.46 cm/s LV PW:         0.90 cm  LV E/e' lateral: 8.6 LV IVS:        0.70 cm  LV e' medial:    8.05 cm/s LVOT diam:     2.00 cm  LV E/e' medial:  10.1 LV SV:         74 LV SV Index:   43 LVOT Area:     3.14 cm  RIGHT VENTRICLE RV S prime:     17.10 cm/s TAPSE (M-mode): 2.5 cm LEFT ATRIUM             Index       RIGHT ATRIUM           Index LA diam:        3.40 cm 1.98 cm/m  RA Area:     11.40 cm LA Vol (A2C):   26.9 ml 15.68 ml/m RA Volume:   23.90 ml  13.93 ml/m LA Vol (A4C):   38.5 ml 22.44 ml/m LA Biplane Vol: 33.5 ml 19.53 ml/m  AORTIC VALVE LVOT Vmax:   111.00 cm/s LVOT Vmean:  70.600 cm/s LVOT VTI:    0.234 m MITRAL VALVE MV Area (PHT): 3.12 cm    SHUNTS MV Decel Time: 243 msec    Systemic VTI:  0.23 m MV E velocity: 81.00 cm/s  Systemic Diam: 2.00 cm MV A velocity: 99.80 cm/s MV E/A ratio:  0.81 Eleonore Chiquito MD Electronically signed by Eleonore Chiquito MD Signature Date/Time: 02/14/2020/4:33:15 PM    Final    VAS [place] LOWER EXTREMITY VENOUS (DVT)  Result Date: 02/23/2020  Lower Venous DVTStudy  Indications: Pulmonary embolism.  Limitations: Patient positioning. Comparison Study: no prior Performing Technologist: [name] RVS  Examination Guidelines: A complete evaluation includes B-mode imaging, spectral Doppler, color Doppler, and power Doppler as needed of all accessible portions of each vessel. Bilateral testing is considered an integral part of a complete examination. Limited examinations for reoccurring indications may be performed as noted. The reflux portion of the exam is performed with  the patient in reverse Trendelenburg.  +---------+---------------+---------+-----------+----------+--------------+ RIGHT    CompressibilityPhasicitySpontaneityPropertiesThrombus Aging +---------+---------------+---------+-----------+----------+--------------+ CFV      Full           Yes      Yes                                 +---------+---------------+---------+-----------+----------+--------------+ SFJ      Full                                                        +---------+---------------+---------+-----------+----------+--------------+ FV Prox  Full                                                        +---------+---------------+---------+-----------+----------+--------------+ FV Mid   Full                                                        +---------+---------------+---------+-----------+----------+--------------+ FV DistalFull                                                        +---------+---------------+---------+-----------+----------+--------------+ PFV      Full                                                        +---------+---------------+---------+-----------+----------+--------------+ POP      Full           Yes      Yes                                 +---------+---------------+---------+-----------+----------+--------------+ PTV      Full                                                        +---------+---------------+---------+-----------+----------+--------------+ PERO     Full                                                        +---------+---------------+---------+-----------+----------+--------------+   +---------+---------------+---------+-----------+----------+--------------+  LEFT     CompressibilityPhasicitySpontaneityPropertiesThrombus Aging +---------+---------------+---------+-----------+----------+--------------+ CFV      Full           Yes      Yes                                  +---------+---------------+---------+-----------+----------+--------------+ SFJ      Full                                                        +---------+---------------+---------+-----------+----------+--------------+ FV Prox  Full                                                        +---------+---------------+---------+-----------+----------+--------------+ FV Mid   Full                                                        +---------+---------------+---------+-----------+----------+--------------+ FV DistalFull                                                        +---------+---------------+---------+-----------+----------+--------------+ PFV      Full                                                        +---------+---------------+---------+-----------+----------+--------------+ POP      Full           Yes      Yes                                 +---------+---------------+---------+-----------+----------+--------------+ PTV      Full                                                        +---------+---------------+---------+-----------+----------+--------------+ PERO     Full                                                        +---------+---------------+---------+-----------+----------+--------------+     Summary: BILATERAL: - No evidence of deep vein thrombosis seen in the lower extremities, bilaterally.   *See table(s) above for measurements and observations. Electronically signed by Harold Barban MD on 02/23/2020 at 5:39:17 PM.    Final    [place]  EKG SITE RITE  Result Date: 02/14/2020 If Site Rite image not attached, placement could not be confirmed due to current cardiac rhythm.   ELIGIBLE FOR AVAILABLE RESEARCH PROTOCOL: no  ASSESSMENT: 74 y.o. [place] woman presenting 02/13/2020 with headache, found to have a right lateral ventricle bleed, in the setting of panmyelosis, most c/w polycythemia vera.   (1) leukapheresis x3 . Last  leukapheresis Friday 02/18/2020  (a) HD catheter removed after pheresis on  02/18/2020  (2) bone marrow biopsy 02/13/2020 shows no evidence of leukemia, c/w P Vera; additional confirmatory labs pending  (a) JAK2 mutation positive confirming diagnosis  (3) rule out acquired von Willebrand disease: borderling low ristocetin cofactor; does not meet criteria for vWD; will repeat once patient in remission  (4) intraventricular bleed: amyloid angiopathy? Other?  (a) outpatient neurologic follow-up?  (5) bilateral pulmonary emboli  (a) status post IVC filter placement on 02/22/2020  (6) atrial fibrillation with RVR  PLAN: Tashara is back in the ICU.  She has an ongoing headache and she is not actively bleeding.  Unfortunately, her CBC from today shows a significant drop in her platelet count down to 24,000.  I have discontinued her hydroxyurea.  Recommend continued close monitoring of her CBC and transfuse platelets for a platelet count less than 10,000 or active bleeding.  Additionally, will add on additional lab work including a DIC panel, HIT panel (recently received low-dose heparin with her pheresis as recently as 02/18/2020), and a platelet count in citrate to confirm her low platelet count.  We will continue to follow with you.  [name], NP   02/24/2020 12:10 PM

## 2020-02-24 NOTE — Progress Notes (Signed)
STROKE TEAM PROGRESS NOTE   INTERVAL HISTORY Patient continued to have afib with RVR y`day and was transferred to ICU and started on amiodarone drip by cardiology and is doing better today. Sitting up. Complains of increased headache.no SOB and oxygen sats are good on room air.Platelets are low at 24,000and were 160000 2 days ago. Will ask hematology to follow  Vitals:   02/24/20 1100 02/24/20 1130 02/24/20 1136 02/24/20 1200  BP:  139/68  (!) 146/72  Pulse: 87 76  75  Resp: 18 20  19   Temp:   (!) 97.4 F (36.3 C)   TempSrc:      SpO2: 97% 96%  96%  Weight:      Height:       CBC:  Recent Labs  Lab 02/21/20 0403 02/21/20 0403 02/22/20 0346 02/24/20 1144  WBC 19.5*   < > 18.4* 10.2  NEUTROABS 17.3*  --  16.2*  --   HGB 12.9   < > 13.4 14.1  HCT 38.4   < > 40.4 41.4  MCV 90.1   < > 90.6 87.9  PLT 324   < > 160 24*   < > = values in this interval not displayed.   Basic Metabolic Panel:  Recent Labs  Lab 02/21/20 0403 02/21/20 0403 02/22/20 0346 02/24/20 0920  NA 140   < > 139 140  K 3.3*   < > 3.3* 2.3*  CL 113*   < > 109 106  CO2 20*   < > 21* 21*  GLUCOSE 139*   < > 123* 143*  BUN 10   < > 7* 6*  CREATININE 0.72   < > 0.68 0.73  CALCIUM 8.2*   < > 8.8* 8.5*  MG 2.2  --   --  1.8   < > = values in this interval not displayed.   Lipid Panel:     Component Value Date/Time   CHOL 172 02/14/2020 0959   TRIG 46 02/14/2020 0959   HDL 71 02/14/2020 0959   CHOLHDL 2.4 02/14/2020 0959   VLDL 9 02/14/2020 0959   LDLCALC 92 02/14/2020 0959   HgbA1c:  Lab Results  Component Value Date   HGBA1C 6.1 (H) 02/14/2020    IMAGING past 24 hours No results found.   PHYSICAL EXAM      General - Well nourished, well developed, no acute distress Ophthalmologic - fundi not visualized due to noncooperation. Respiratory system tachypneic but oxygen sats are maintained.  Lungs are clear to auscultation.  No wheezing or rhonchi Cardiovascular - Regular rhythm and  rate.  Mental Status -  She is awake and alert and follows commands appropriately. Speech is fluent.  Language including expression, naming, repetition, comprehension was assessed and found intact.  Cranial Nerves II - XII - II - Visual field intact OU. III, IV, VI - Extraocular movements intact. V - Facial sensation intact bilaterally. VII - mild left facial droop. VIII - Hearing & vestibular intact bilaterally. X - Palate elevates symmetrically. XI - Chin turning & shoulder shrug intact bilaterally. XII - Tongue protrusion intact.  Motor Strength - The patient's strength was normal in all extremities and pronator drift was absent.  Bulk was normal and fasciculations were absent.   Motor Tone - Muscle tone was assessed at the neck and appendages and was normal.  Reflexes - The patient's reflexes were symmetrical in all extremities and she had no pathological reflexes.  Sensory - Light touch, temperature/pinprick were assessed and  were symmetrical.    Coordination - The patient had normal movements in the hands with no ataxia or dysmetria.  Tremor was absent.  Gait and Station - deferred.   ASSESSMENT/PLAN Ms. TANEYA VALOIS is a 74 y.o. female with history of HTN presenting with severe persistent HA accompanied by nausea and vomiting. This has now improved.   IVH - right lateral ventricle IVH secondary to ? CAA vs. HTN Persistent severe headache since admission for 5 days. Lethargy has now improved.  Repeat brain imaging does not show significant hydrocephalus or increasing hemorrhage  CT head 4/5 0032 R lateral IVH. Severe chronic ischemic microangiopathy.  CT head 4/5 0857 no sign change  MRI Stable IVH, severe leukoaraiosis. Multiple foci throughout progressed from previous MRI d/t microangiopathy vs amyloid.  MRA unremarkable   Repeat CT head 4/7 stable IVH and ventricle size  Repeat CT head 4/9 - Unchanged size and configuration of right lateral intraventricular  hemorrhage since 02/16/2020. Small volume of intraventricular hemorrhage elsewhere has regressed. No ventriculomegaly or transependymal edema. No new intracranial abnormality. Advanced white matter disease, and numerous chronic microhemorrhages throughout the brain demonstrated on recent MRI.  CTA Head - 02/19/2020 - Suboptimal evaluation due to artifact. Patent anterior and posterior circulations. No abnormal vascularity identified.  EEG - This study is suggestive of moderate diffuse encephalopathy, non specific to etiology but could be secondary to toxic-metabolic causes. No seizures or epileptiform discharges were seen throughout the recording.   2D Echo EF 65-70%. No source of embolus   LDL 92   UA - unremarkable except ketones 20 (not diabetic)  HgbA1c 6.1   SCDs for VTE prophylaxis  aspirin 81 mg daily prior to admission, now on No antithrombotic given IVH and likely CAA  Therapy recommendations:  HH PT, HH OT, HH SLP (arranged)  Disposition:  pending  (held d/t new PE and now Afib RVR)  Headache, due to IVH  Depakote 500 mg nightly discontinued  Pt now has Fioricet one Q8 hrs prn as well as tylenol prn  Solumedrol 500 mg IV daily for 3 days started Saturday  Dilaudid - one time dose 02/19/20 per Dr Leonie Man  NS at 100 cc's / hr started 4/10  Therapeutic phlebotomy performed 4/10  Add topamax 25 bid  Start prednisone taper 40-30-20-10-5-5 on 4/13>>  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 Ron Agee   IV fluids NS at 100 cc's  per hr.  Dr Leonie Man discussed with Dr Alen Blew on Saturday -> 1 unit of phlebotomy 4/10  Bilateral PE  New SOB and tachycardia  CTA chest - BLE PE. Coronary calcification. BUL emphysema. R sided nodules - RLL inplammation. RLM inflammation vs atx. - need f/u CT in 3-4 wks to ensure resolution and r/o neoplasm.  Unable to get [facility] d/t IVH  Filter placement on 4/13  Afib RVR in 160's - Diltizem gtt started - Unable to anticoagulate d/t ICH  Hypertension  Home meds:  losartan 100, amlodipine 5  On losartan 50 bid and amlodipine 10  Add hydralazine 25 tid . SBP goal < 140  Off Cleviprex  BP Stable  . 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 Multiple foci throughout progressed from previous MRI d/t microangiopathy vs amyloid.  As per husband, pt has some anomia as baseline  BP goal < 140  Avoid antiplatelet or anticoagulation  Hyperlipidemia  Home meds:  lipitor 10  LDL 92  Statin held in setting of acute ICH  Consider continuation of statin at discharge  Hypokalemia and Hypomagnesemia   Potassium 3.3->3.5->2.7->3.8->3.4->3.3 supplement and recheck in AM  Magnesium - 1.6 supplement - 2.2  Magnesium may help HA pain  Other Stroke Risk Factors  Advanced age  Former Cigarette smoker  Overweight, Body mass index is 26.73 kg/m., recommend weight loss, diet and exercise as appropriate   Family hx stroke (mother, father, and multiple other family members)  Other Active Problems  Hepatitis C  Hypothyroid on synthroid. TSH WNL - resume synthroid   Hospital day # 10 Pt has AFIB RVR now rate controlled on amiodarone. Appreciate cardiology help. . Continue prednisone taper for headache and increase Topamax 50 milligrams twice daily and check head CT for increase bleed as platelets have  dropped..   This patient is critically ill and at significant risk of neurological worsening, death and care requires constant monitoring of vital signs, hemodynamics,respiratory and cardiac monitoring, extensive review of multiple databases, frequent neurological assessment, discussion with family, other specialists and medical decision making of high complexity.I have made any additions or clarifications directly to the above note.This critical care time does not reflect procedure time, or teaching time or supervisory time of PA/NP/Med Resident etc but could involve care discussion time.  I spent 30 minutes of neurocritical care time  in the care of  this patient.     Antony Contras, MD Medical Director [facility] Pager: [phone] 02/24/2020 12:49 PM   To contact Stroke Continuity provider, please refer to [email or web address] After hours, contact General Neurology

## 2020-02-24 NOTE — Progress Notes (Signed)
4 gold rings removed by patient prior to administration of general anesthesia.  Patient requested that they be given to her husband.  Items placed in a bag with patient sticker and given to husband at 1648.

## 2020-02-24 NOTE — Consult Note (Signed)
Chief Complaint: Patient was seen in consultation today for venous thrombosis.   Referring Physician(s): Antony Contras, MD  Supervising Physician: Pedro Earls  Patient Status: [facility] - In-pt  History of Present Illness: [name] is a 74 y.o. female with a past medical history significant for osteoporosis, hepatitis C, HLD, HTN, polycythemia vera, right lateral ventricle ICB, JAK2 mutation and PE s/p IVC filter placement 02/22/20 in IR who originally presented to [facility] ED on 02/14/20 with complaints of headache. She was found to have an acute intraventricular hemorrhage within the right lateral ventricle most consistent with polycythemia vera. She was admitted for further evaluation - she underwent a bone marrow biopsy in IR on 4/6 which showed hypercellular bone marrow for age with pan myeloid proliferation. She has been followed closely by hematology and has undergone several rounds of therapeutic phlebotomy and pheresis as well as receiving hydroxyurea. She has continued to have headaches and has been followed by neurology as well for this. On 02/22/20 she began to experience dyspnea and CTA chest was obtained which showed acute bilateral lower lobe PE - IR was asked to place an IVC filter which occurred without incident that same day. Today she began to experience worsening of her headache and a CT head was obtained which noted hyperdensity of the straight sinus suggesting deep cerebral venous sinus thrombosis. A follow up CT venogram was obtained which confirmed extensive thrombus within the confluence of sinuses and straight sinus as well as extending into the vein of Galen. NIR has been consulted for an image guided arteriogram/venogram with possible intervention.  Patient seen in IR suite, sluggish to respond to questions, states she has to pee. Procedure was discussed with husband and son (both named [name]) by Dr. Karenann Cai and they both state understanding  to the requested procedure and are agreeable to proceed.  Past Medical History:  Diagnosis Date  . Hepatitis C   . Hyperlipidemia   . Hypertension   . Osteoporosis     Past Surgical History:  Procedure Laterality Date  . IR IVC FILTER PLMT / S&I /IMG GUID/MOD SED  02/22/2020    Allergies: Erythromycin  Medications: Prior to Admission medications   Medication Sig Start Date End Date Taking? Authorizing Provider  ALPRAZolam Duanne Moron) 0.5 MG tablet Take 0.25 mg by mouth daily as needed for anxiety.    Yes [provider]  amLODipine (NORVASC) 5 MG tablet Take 5 mg by mouth daily. 01/26/20  Yes [provider]  aspirin 81 MG tablet Take 1 tablet (81 mg total) by mouth daily. 08/17/14  Yes Rai, Ripudeep K, MD  atorvastatin (LIPITOR) 10 MG tablet Take 10 mg by mouth every evening.    Yes [provider]  Cholecalciferol (VITAMIN D3) 2000 UNITS TABS Take 2,000 Units by mouth every evening.   Yes [provider]  levothyroxine (SYNTHROID, LEVOTHROID) 125 MCG tablet Take 125 mcg by mouth daily before breakfast.   Yes [provider]  losartan (COZAAR) 100 MG tablet Take 100 mg by mouth every evening.   Yes [provider]  Probiotic Product (ALIGN PO) Take 1 capsule by mouth daily.   Yes [provider]  SYMBICORT 160-4.5 MCG/ACT inhaler Inhale 1 puff into the lungs daily as needed (shortness of breath).  12/03/19  Yes [provider]  TRELEGY ELLIPTA 100-62.5-25 MCG/INH AEPB Inhale 1 puff into the lungs daily as needed (shortness of breath).  02/03/20  Yes [provider]  allopurinol (  ZYLOPRIM) 300 MG tablet Take 1 tablet (300 mg total) by mouth daily. 02/18/20   Magrinat, Virgie Dad, MD  hydroxyurea (HYDREA) 500 MG capsule Take 1 capsule (500 mg total) by mouth daily. May take with food to minimize GI side effects. 02/18/20   Magrinat, Virgie Dad, MD     History reviewed. No pertinent family history.  Social History    Socioeconomic History  . Marital status: Married    Spouse name: Not on file  . Number of children: Not on file  . Years of education: Not on file  . Highest education level: Not on file  Occupational History  . Not on file  Tobacco Use  . Smoking status: Former Research scientist (life sciences)  . Smokeless tobacco: Never Used  Substance and Sexual Activity  . Alcohol use: No  . Drug use: No  . Sexual activity: Not on file  Other Topics Concern  . Not on file  Social History Narrative  . Not on file   Social Determinants of Health   Financial Resource Strain:   . Difficulty of Paying Living Expenses:   Food Insecurity:   . Worried About Charity fundraiser in the Last Year:   . Arboriculturist in the Last Year:   Transportation Needs:   . Film/video editor (Medical):   Marland Kitchen Lack of Transportation (Non-Medical):   Physical Activity:   . Days of Exercise per Week:   . Minutes of Exercise per Session:   Stress:   . Feeling of Stress :   Social Connections:   . Frequency of Communication with Friends and Family:   . Frequency of Social Gatherings with Friends and Family:   . Attends Religious Services:   . Active Member of Clubs or Organizations:   . Attends Archivist Meetings:   Marland Kitchen Marital Status:      Review of Systems: A 12 point ROS discussed and pertinent positives are indicated in the HPI above.  All other systems are negative.  Review of Systems  Unable to perform ROS: Acuity of condition    Vital Signs: BP (!) 151/76   Pulse 79   Temp (!) 97.4 F (36.3 C)   Resp 18   Ht _0  (1.575 m)   Wt 146 lb 2.6 oz (66.3 kg)   SpO2 97%   BMI 26.73 kg/m   Physical Exam Vitals and nursing note reviewed.  Constitutional:      Comments: Sluggish, unable to answer most questions appropriately  HENT:     Head: Normocephalic.  Cardiovascular:     Rate and Rhythm: Normal rate and regular rhythm.     Comments: Distal pulses dopplerable bilaterally Pulmonary:     Effort:  Pulmonary effort is normal.     Breath sounds: Normal breath sounds.  Abdominal:     General: There is no distension.     Palpations: Abdomen is soft.     Tenderness: There is no abdominal tenderness.  Skin:    General: Skin is warm and dry.  Neurological:     Mental Status: She is alert. She is disoriented.      MD Evaluation Airway: WNL Heart: WNL Abdomen: WNL Chest/ Lungs: WNL ASA  Classification: 3 Mallampati/Airway Score: Two   Imaging: CT ANGIO HEAD W OR WO CONTRAST  Result Date: 02/19/2020 CLINICAL DATA:  Intraventricular hemorrhage EXAM: CT ANGIOGRAPHY HEAD TECHNIQUE: Multidetector CT imaging of the head was performed using the standard protocol during bolus administration of intravenous contrast.  Multiplanar CT image reconstructions and MIPs were obtained to evaluate the vascular anatomy. CONTRAST:  33m OMNIPAQUE IOHEXOL 350 MG/ML SOLN COMPARISON:  None. FINDINGS: Artifact is present. CTA HEAD Anterior circulation: Intracranial internal carotid arteries are patent with mild calcified plaque. Anterior and middle cerebral arteries are patent. Posterior circulation: Intracranial vertebral arteries, basilar artery, and posterior cerebral arteries are patent. Venous sinuses: Not opacified. IMPRESSION: Suboptimal evaluation due to artifact. Patent anterior and posterior circulations. No abnormal vascularity identified. Electronically Signed   By: PMacy MisM.D.   On: 02/19/2020 13:09   DG Chest 2 View  Result Date: 02/22/2020 CLINICAL DATA:  Shortness of breath EXAM: CHEST - 2 VIEW COMPARISON:  February 14, 2020 FINDINGS: There is atelectatic change in the right base. Lungs elsewhere are clear. Heart size and pulmonary vascularity are normal. No adenopathy. No bone lesions. IMPRESSION: Right base atelectasis. Lungs elsewhere clear. Cardiac silhouette within normal limits. Electronically Signed   By: WLowella GripIII M.D.   On: 02/22/2020 09:20   CT Head Wo  Contrast  Addendum Date: 02/24/2020   ADDENDUM REPORT: 02/24/2020 15:17 ADDENDUM: These results were called by telephone at the time of interpretation on 02/24/2020 at 2:40 pm to provider PRAMOD SETHI , who verbally acknowledged these results. Electronically Signed   By: KKellie SimmeringDO   On: 02/24/2020 15:17   Result Date: 02/24/2020 CLINICAL DATA:  Stroke, follow-up. EXAM: CT HEAD WITHOUT CONTRAST TECHNIQUE: Contiguous axial images were obtained from the base of the skull through the vertex without intravenous contrast. COMPARISON:  Head CT 02/19/2020, brain MRI 02/14/2020 FINDINGS: Brain: Interval decrease in conspicuity of intraventricular hemorrhage again most notably within the atrium and temporal horn of the right lateral ventricle. Redemonstrated small volume acute hemorrhage layering within the occipital horns. No ventriculomegaly. No midline shift. Redemonstrated advanced cerebral white matter hypodensity. No demarcated cortical infarct. Cerebral volume is normal for age. Vascular: Atherosclerotic arterial calcifications. New as compared to prior examination 02/19/2020 there is increased density of the straight sinus, vein of Galen and inferior sagittal sinus. Hyperdensity is also questioned within the lateral left transverse sinus. Skull: Normal. Negative for fracture or focal lesion. Sinuses/Orbits: Frothy secretions layering within the right maxillary sinus. No significant mastoid effusion. IMPRESSION: Increased density of the straight sinus, vein of Galen and inferior sagittal sinus. Hyperdensity is also questioned within portions of the lateral left transverse sinus. Findings are suspicious for dural venous sinus thrombosis. Consider CT venography for further evaluation. Interval decrease in conspicuity of intraventricular hemorrhage again most notable within the atrium and temporal horn of the right lateral ventricle. No ventriculomegaly. Redemonstrated severe cerebral white matter disease.  Electronically Signed: By: KKellie SimmeringDO On: 02/24/2020 14:34   CT HEAD WO CONTRAST  Result Date: 02/19/2020 CLINICAL DATA:  74year old female who presented with right side intraventricular hemorrhage on 02/14/2020. Altered mental status. History of polycythemia. Subsequent encounter. EXAM: CT HEAD WITHOUT CONTRAST TECHNIQUE: Contiguous axial images were obtained from the base of the skull through the vertex without intravenous contrast. COMPARISON:  CTA head and neck earlier today. Head CT yesterday, and earlier. FINDINGS: Brain: Continued stable volume of right lateral intraventricular hemorrhage. Small volume of layering hemorrhage in the contralateral left occipital horn is stable. No other intraventricular blood at this time. No ventriculomegaly. Pronounced bilateral cerebral white matter hypodensity is stable. No significant intracranial mass effect. Stable gray-white matter differentiation throughout the brain. No cortically based acute infarct identified. Vascular: Stable. Mild Calcified atherosclerosis at the skull base. Skull: Stable,  negative. Sinuses/Orbits: Visualized paranasal sinuses and mastoids are stable and well pneumatized. Other: No acute orbit or scalp soft tissue finding. IMPRESSION: 1. Stable since 02/18/2020. Unchanged volume of IVH primarily in the right lateral ventricle. No intracranial mass effect. 2. Severe cerebral white matter disease. Numerous chronic microhemorrhages throughout the brain also demonstrated recently by MRI. Electronically Signed   By: Genevie Ann M.D.   On: 02/19/2020 20:39   CT HEAD WO CONTRAST  Result Date: 02/18/2020 CLINICAL DATA:  74 year old female who presented with right side intraventricular hemorrhage on 02/14/2020. History of polycythemia. Subsequent encounter. EXAM: CT HEAD WITHOUT CONTRAST TECHNIQUE: Contiguous axial images were obtained from the base of the skull through the vertex without intravenous contrast. COMPARISON:  Head CT 02/16/2020 and  earlier. FINDINGS: Brain: Continued stable appearance of right lateral intraventricular hemorrhage. Since 02/16/2020 a small volume of 4th ventricular blood has cleared. No significant 3rd ventricular blood is evident. Layering blood in the left occipital horn is stable. Stable ventricle size and configuration. No definite transependymal edema. No parenchymal extension of blood or new intracranial hemorrhage identified. Confluent bilateral cerebral white matter hypodensity is stable. No midline shift. Basilar cisterns remain patent. No cortically based acute infarct identified. Vascular: Calcified atherosclerosis at the skull base. Conspicuous increased vascular density probably reflects known polycythemia. Skull: Stable, negative. Sinuses/Orbits: Stable sinus aeration. Other: Visualized orbits and scalp soft tissues are within normal limits. IMPRESSION: 1. Unchanged size and configuration of right lateral intraventricular hemorrhage since 02/16/2020. Small volume of intraventricular hemorrhage elsewhere has regressed. No ventriculomegaly or transependymal edema. 2. No new intracranial abnormality. 3. Advanced white matter disease, and numerous chronic microhemorrhages throughout the brain demonstrated on recent MRI. Electronically Signed   By: Genevie Ann M.D.   On: 02/18/2020 20:11   CT HEAD WO CONTRAST  Result Date: 02/16/2020 CLINICAL DATA:  Intracranial hemorrhage, follow-up EXAM: CT HEAD WITHOUT CONTRAST TECHNIQUE: Contiguous axial images were obtained from the base of the skull through the vertex without intravenous contrast. COMPARISON:  02/14/2020 FINDINGS: Brain: Intraventricular hemorrhage is again identified primarily within the right lateral ventricle. There is no new hemorrhage. Caliber of the ventricles is similar. No new loss of gray differentiation. Confluent hypoattenuation is again identified supratentorial white matter. Vascular: No new finding. Skull: Remains unremarkable. Sinuses/Orbits: No  acute finding. Other: None. IMPRESSION: No substantial change in intraventricular hemorrhage primarily within the right lateral ventricle. No new hemorrhage. Stable caliber of the ventricles. Electronically Signed   By: Macy Mis M.D.   On: 02/16/2020 07:09   CT HEAD WO CONTRAST  Result Date: 02/14/2020 CLINICAL DATA:  Follow-up hemorrhage EXAM: CT HEAD WITHOUT CONTRAST TECHNIQUE: Contiguous axial images were obtained from the base of the skull through the vertex without intravenous contrast. COMPARISON:  Earlier same day, 8 hours ago. FINDINGS: Brain: Intraventricular hemorrhage within the right lateral ventricle persists, approximately the same volume without evidence of ongoing or additional bleeding. Small amount of blood present within the fourth ventricle and left lateral ventricle. Small amount of subarachnoid blood visible within a left parietal sulcus. No overall change in ventricular size. Widespread chronic microangiopathic change of the white matter as seen previously. Old small vessel infarction in the right basal ganglia. No sign of acute infarction. I do not identify an intraparenchymal source. Vascular: There is atherosclerotic calcification of the major vessels at the base of the brain. Skull: Negative Sinuses/Orbits: Clear/normal Other: None IMPRESSION: No significant change since 8 hours ago. Intraventricular hemorrhage primarily within the right lateral ventricle. No evidence of  ongoing bleeding. Small amount of blood dependent within the left lateral ventricle and within the fourth ventricle. Ventricular size is stable. Small amount of subarachnoid blood evident in a left parietal sulcus. Electronically Signed   By: Nelson Chimes M.D.   On: 02/14/2020 08:57   CT Head Wo Contrast  Result Date: 02/14/2020 CLINICAL DATA:  Acute onset headache EXAM: CT HEAD WITHOUT CONTRAST TECHNIQUE: Contiguous axial images were obtained from the base of the skull through the vertex without intravenous  contrast. COMPARISON:  None. FINDINGS: Brain: There is acute hemorrhage within the right lateral ventricle. No definite intraparenchymal component is identified. There is severe chronic white matter disease, most commonly indicating chronic ischemic microangiopathy. Volume of CSF spaces is normal. No midline shift or other mass effect Vascular: No abnormal hyperdensity of the major intracranial arteries or dural venous sinuses. No intracranial atherosclerosis. Skull: The visualized skull base, calvarium and extracranial soft tissues are normal. Sinuses/Orbits: No fluid levels or advanced mucosal thickening of the visualized paranasal sinuses. No mastoid or middle ear effusion. The orbits are normal. IMPRESSION: 1. Acute intraventricular hemorrhage within the right lateral ventricle. No definite parenchymal component identified. This is an unusual pattern, but is perhaps less unlikely in the setting of this patient's abnormal coagulation status. 2. Severe chronic ischemic microangiopathy. Critical Value/emergent results were called by telephone at the time of interpretation on 02/14/2020 at 12:31 am to provider DAVID [facility] , who verbally acknowledged these results. Electronically Signed   By: Ulyses Jarred M.D.   On: 02/14/2020 00:32   CT ANGIO CHEST PE W OR WO CONTRAST  Addendum Date: 02/22/2020   ADDENDUM REPORT: 02/22/2020 12:39 ADDENDUM: Critical Value/emergent results were called by telephone at the time of interpretation on 02/22/2020 at 12:32 pm to provider Dr. Leonie Man, who verbally acknowledged these results. Electronically Signed   By: Marijo Conception M.D.   On: 02/22/2020 12:39   Result Date: 02/22/2020 CLINICAL DATA:  Shortness of breath. EXAM: CT ANGIOGRAPHY CHEST WITH CONTRAST TECHNIQUE: Multidetector CT imaging of the chest was performed using the standard protocol during bolus administration of intravenous contrast. Multiplanar CT image reconstructions and MIPs were obtained to evaluate the vascular  anatomy. CONTRAST:  158m OMNIPAQUE IOHEXOL 350 MG/ML SOLN COMPARISON:  None. FINDINGS: Cardiovascular: Filling defects are noted in the lower lobe branches of both pulmonary arteries consistent with acute pulmonary emboli. Normal cardiac size. No pericardial effusion. Coronary artery calcifications are noted. Atherosclerosis of thoracic aorta is noted without aneurysm formation. Mediastinum/Nodes: No enlarged mediastinal, hilar, or axillary lymph nodes. Thyroid gland, trachea, and esophagus demonstrate no significant findings. Lungs/Pleura: No pneumothorax or pleural effusion is noted. Emphysematous disease is noted in the upper lobes bilaterally. Nodular densities are noted laterally in the right lower lobe most consistent with atypical inflammation or other inflammation. Right middle lobe opacity is noted concerning for atelectasis or possibly inflammation. Upper Abdomen: Probable exophytic cyst seen arising from upper pole of left kidney. Musculoskeletal: No chest wall abnormality. No acute or significant osseous findings. Review of the MIP images confirms the above findings. IMPRESSION: 1. Acute bilateral lower lobe pulmonary emboli are noted. 2. Coronary artery calcifications are noted suggesting coronary artery disease. 3. Emphysematous disease is noted in the upper lobes bilaterally. 4. Nodular densities are noted laterally in the right lower lobe most consistent with atypical inflammation or other inflammation. Right middle lobe opacity is noted concerning for atelectasis or possibly inflammation. Follow-up unenhanced chest CT in 3-4 weeks is recommended to ensure resolution and rule out  neoplasm. Aortic Atherosclerosis (ICD10-I70.0) and Emphysema (ICD10-J43.9). Electronically Signed: By: Marijo Conception M.D. On: 02/22/2020 12:26   MR ANGIO HEAD WO CONTRAST  Result Date: 02/14/2020 CLINICAL DATA:  Follow-up cerebral hemorrhage. EXAM: MRI HEAD WITHOUT CONTRAST MRA HEAD WITHOUT CONTRAST TECHNIQUE:  Multiplanar, multiecho pulse sequences of the brain and surrounding structures were obtained without intravenous contrast. Angiographic images of the head were obtained using MRA technique without contrast. COMPARISON:  Head CT February 14, 2020 6 FINDINGS: MRI HEAD FINDINGS Brain: Redemonstrated right intraventricular hemorrhage with extension into the third and fourth ventricle as well as the occipital horn of left lateral ventricle. The ventricular size remains stable. No midline shift. A loculated fluid collection measuring approximately 1 cm with hematocrit level is noted in the left parietal region at midline (series 11, image 23). Extensive confluent foci of T2 hyperintensity are seen within the white matter of the cerebral hemispheres, basal ganglia, thalami and pons, nonspecific. Scattered punctate foci of susceptibility artifact are seen the bilateral cerebral hemispheres predominantly in subcortical location, bilateral thalami, pons and cerebellar hemispheres, likely representing hemosiderin deposit. This appears progressed since prior MRI. Vascular: Normal flow voids. Skull and upper cervical spine: Normal marrow signal. Sinuses/Orbits: Mild mucosal thickening of the right maxillary sinus. The orbits are maintained. Other: None. MRA HEAD FINDINGS The visualized portions of the distal cervical and intracranial internal carotid arteries are widely patent with normal flow related enhancement. The bilateral anterior cerebral arteries and middle cerebral arteries are widely patent with antegrade flow without high-grade flow-limiting stenosis or proximal branch occlusion. No intracranial aneurysm within the anterior circulation. The vertebral arteries are widely patent with antegrade flow. The posterior inferior cerebral arteries are normal. Redemonstrated is a fenestration of the bilateral A1 segments. Vertebrobasilar junction and basilar artery are widely patent with antegrade flow without evidence of basilar  stenosis or aneurysm. Posterior cerebral arteries are normal bilaterally. No intracranial aneurysm within the posterior circulation. IMPRESSION: 1. Stable right intraventricular hemorrhage with extension into the third and fourth ventricle. Stable ventricular size. No evidence of hydrocephalus. 2. Extensive confluent T2 hyperintensity within the white matter of the cerebral hemispheres, basal ganglia, thalami, and pons, nonspecific but may be related to chronic small vessel ischemic changes. 3. Multiple foci of susceptibility artifact throughout the brain parenchyma, progressed from prior MRI. These may be related to microangiopathy versus amyloid angiopathy. Electronically Signed   By: Pedro Earls M.D.   On: 02/14/2020 14:54   MR BRAIN WO CONTRAST  Result Date: 02/14/2020 CLINICAL DATA:  Follow-up cerebral hemorrhage. EXAM: MRI HEAD WITHOUT CONTRAST MRA HEAD WITHOUT CONTRAST TECHNIQUE: Multiplanar, multiecho pulse sequences of the brain and surrounding structures were obtained without intravenous contrast. Angiographic images of the head were obtained using MRA technique without contrast. COMPARISON:  Head CT February 14, 2020 6 FINDINGS: MRI HEAD FINDINGS Brain: Redemonstrated right intraventricular hemorrhage with extension into the third and fourth ventricle as well as the occipital horn of left lateral ventricle. The ventricular size remains stable. No midline shift. A loculated fluid collection measuring approximately 1 cm with hematocrit level is noted in the left parietal region at midline (series 11, image 23). Extensive confluent foci of T2 hyperintensity are seen within the white matter of the cerebral hemispheres, basal ganglia, thalami and pons, nonspecific. Scattered punctate foci of susceptibility artifact are seen the bilateral cerebral hemispheres predominantly in subcortical location, bilateral thalami, pons and cerebellar hemispheres, likely representing hemosiderin deposit. This  appears progressed since prior MRI. Vascular: Normal flow voids. Skull  and upper cervical spine: Normal marrow signal. Sinuses/Orbits: Mild mucosal thickening of the right maxillary sinus. The orbits are maintained. Other: None. MRA HEAD FINDINGS The visualized portions of the distal cervical and intracranial internal carotid arteries are widely patent with normal flow related enhancement. The bilateral anterior cerebral arteries and middle cerebral arteries are widely patent with antegrade flow without high-grade flow-limiting stenosis or proximal branch occlusion. No intracranial aneurysm within the anterior circulation. The vertebral arteries are widely patent with antegrade flow. The posterior inferior cerebral arteries are normal. Redemonstrated is a fenestration of the bilateral A1 segments. Vertebrobasilar junction and basilar artery are widely patent with antegrade flow without evidence of basilar stenosis or aneurysm. Posterior cerebral arteries are normal bilaterally. No intracranial aneurysm within the posterior circulation. IMPRESSION: 1. Stable right intraventricular hemorrhage with extension into the third and fourth ventricle. Stable ventricular size. No evidence of hydrocephalus. 2. Extensive confluent T2 hyperintensity within the white matter of the cerebral hemispheres, basal ganglia, thalami, and pons, nonspecific but may be related to chronic small vessel ischemic changes. 3. Multiple foci of susceptibility artifact throughout the brain parenchyma, progressed from prior MRI. These may be related to microangiopathy versus amyloid angiopathy. Electronically Signed   By: Pedro Earls M.D.   On: 02/14/2020 14:54   IR IVC FILTER PLMT / S&I Burke Keels GUID/MOD SED  Result Date: 02/22/2020 INDICATION: 74 year old with intraventricular hemorrhage, polycythemia vera and bilateral pulmonary emboli. Patient is not a candidate for anticoagulation due to the intraventricular hemorrhage. Request  for IVC filter placement. EXAM: IVC FILTER PLACEMENT; IVC VENOGRAM; ULTRASOUND FOR VASCULAR ACCESS Physician: Stephan Minister. Anselm Pancoast, MD MEDICATIONS: None. ANESTHESIA/SEDATION: Fentanyl 0.5 mcg IV; Versed 50 mg IV Moderate Sedation Time:  24 minutes minutes The patient was continuously monitored during the procedure by the interventional radiology nurse under my direct supervision. CONTRAST:  50 mL Omnipaque 300 FLUOROSCOPY TIME:  Fluoroscopy Time: 4 minutes, 18 seconds, 90 mGy COMPLICATIONS: None immediate. PROCEDURE: Informed consent was obtained for an IVC venogram and filter placement. Ultrasound demonstrated a patent right internal jugular vein. Ultrasound image obtained for documentation. The right side of the neck was prepped and draped in a sterile fashion. Maximal barrier sterile technique was utilized including caps, mask, sterile gowns, sterile gloves, sterile drape, hand hygiene and skin antiseptic. The skin was anesthetized with 1% lidocaine. A 21 gauge needle was directed into the vein with ultrasound guidance and a micropuncture dilator set was placed. A wire was advanced into the IVC. The filter sheath was advanced over the wire into the IVC. An IVC venogram was performed. Five French catheter was used to cannulate the right renal vein with a Bentson wire. Fluoroscopic images were obtained for documentation. A Bard Denali filter was deployed below the lowest renal vein. A follow-up venogram was performed and the vascular sheath was removed with manual compression. FINDINGS: IVC was patent. Bilateral renal veins were identified. The filter was deployed below the lowest renal vein. Follow-up venogram confirmed placement within the IVC and below the renal veins. IMPRESSION: Successful placement of a retrievable IVC filter. PLAN: This IVC filter is potentially retrievable. The patient will be assessed for filter retrieval by Interventional Radiology in approximately 8-12 weeks. Further recommendations regarding  filter retrieval, continued surveillance or declaration of device permanence, will be made at that time. Electronically Signed   By: Markus Daft M.D.   On: 02/22/2020 17:50   DG CHEST PORT 1 VIEW  Result Date: 02/14/2020 CLINICAL DATA:  Status post PICC placement EXAM:  PORTABLE CHEST 1 VIEW COMPARISON:  August 15, 2014 FINDINGS: The heart size and mediastinal contours are within normal limits. Aortic knob calcifications are seen. A right-sided central venous catheter seen with the tip at the superior cavoatrial junction. No pneumothorax. No pleural effusion. No acute osseous abnormality. IMPRESSION: Interval placement of right-sided central venous catheter with the tip at the superior cavoatrial junction. Electronically Signed   By: Prudencio Pair M.D.   On: 02/14/2020 13:09   EEG adult  Result Date: 02/19/2020 Lora Havens, MD     02/19/2020  4:18 PM Patient Name: [name] MRN: [id number] Epilepsy Attending: Lora Havens Referring Physician/Provider: Dr Antony Contras Date: 02/19/2020 Duration: 25.15 mins Patient history: 74yo F with right IVH. EEG to evaluate for seizure. Level of alertness: lethargic AEDs during EEG study: None Technical aspects: This EEG study was done with scalp electrodes positioned according to the 10-20 International system of electrode placement. Electrical activity was acquired at a sampling rate of _0  and reviewed with a high frequency filter of _1  and a low frequency filter of _2 . EEG data were recorded continuously and digitally stored. DESCRIPTION: EEG showed continuous generalized 3-_3  theta-delta slowing. Triphasic waves, generalized, maximal bifrontal were also noted. Hyperventilation and photic stimulation were not performed. ABNORMALITY - Continuous slow, generalized - Triphasics waves, generalized IMPRESSION: This study is suggestive of moderate diffuse encephalopathy, non specific to etiology but could be secondary to toxic-metabolic causes. No seizures or  epileptiform discharges were seen throughout the recording. Priyanka Barbra Sarks   CT BONE MARROW BIOPSY & ASPIRATION  Result Date: 02/15/2020 CLINICAL DATA:  Polycythemia EXAM: CT GUIDED DEEP ILIAC BONE ASPIRATION AND CORE BIOPSY TECHNIQUE: Patient was placed prone on the CT gantry and limited axial scans through the pelvis were obtained. Appropriate skin entry site was identified. Skin site was marked, prepped with chlorhexidine, draped in usual sterile fashion, and infiltrated locally with 1% lidocaine. Intravenous Fentanyl 89mg and Versed 149mwere administered as conscious sedation during continuous monitoring of the patient's level of consciousness and physiological / cardiorespiratory status by the radiology RN, with a total moderate sedation time of 12 minutes. Under CT fluoroscopic guidance an 11-gauge Cook trocar bone needle was advanced into the right iliac bone just lateral to the sacroiliac joint. Once needle tip position was confirmed, core and aspiration samples were obtained, submitted to pathology for approval. Post procedure scans show no hematoma or fracture. Patient tolerated procedure well. COMPLICATIONS: COMPLICATIONS none IMPRESSION: 1. Technically successful CT guided right iliac bone core and aspiration biopsy. Electronically Signed   By: D Lucrezia Europe.D.   On: 02/15/2020 15:11   ECHOCARDIOGRAM COMPLETE  Result Date: 02/14/2020    ECHOCARDIOGRAM REPORT   Patient Name:   CACAMRON ESSMANate of Exam: 02/14/2020 Medical Rec #:  [id number]      Height:       62.0 in Accession #:    [id number]     Weight:       155.0 lb Date of Birth:  10January 29, 1947     BSA:          1.715 m Patient Age:    [age]         BP:           121/56 mmHg Patient Gender: F                HR:           90 bpm. Exam  Location:  Inpatient Procedure: 2D Echo Indications:    stroke 434.91  History:        Patient has prior history of Echocardiogram examinations, most                 recent 08/16/2014. Risk  Factors:Hypertension and Dyslipidemia.  Sonographer:    Johny Chess Referring Phys: [id number] [place] XU IMPRESSIONS  1. Left ventricular ejection fraction, by estimation, is 65 to 70%. The left ventricle has normal function. The left ventricle has no regional wall motion abnormalities. Left ventricular diastolic parameters were normal.  2. Right ventricular systolic function is normal. The right ventricular size is normal. Tricuspid regurgitation signal is inadequate for assessing PA pressure.  3. The mitral valve is grossly normal. No evidence of mitral valve regurgitation. No evidence of mitral stenosis.  4. The aortic valve is tricuspid. Aortic valve regurgitation is not visualized. Mild aortic valve sclerosis is present, with no evidence of aortic valve stenosis.  5. The inferior vena cava is normal in size with greater than 50% respiratory variability, suggesting right atrial pressure of 3 mmHg. Conclusion(s)/Recommendation(s): No intracardiac source of embolism detected on this transthoracic study. A transesophageal echocardiogram is recommended to exclude cardiac source of embolism if clinically indicated. FINDINGS  Left Ventricle: Left ventricular ejection fraction, by estimation, is 65 to 70%. The left ventricle has normal function. The left ventricle has no regional wall motion abnormalities. The left ventricular internal cavity size was normal in size. There is  no left ventricular hypertrophy. Left ventricular diastolic parameters were normal. Right Ventricle: The right ventricular size is normal. No increase in right ventricular wall thickness. Right ventricular systolic function is normal. Tricuspid regurgitation signal is inadequate for assessing PA pressure. Left Atrium: Left atrial size was normal in size. Right Atrium: Right atrial size was normal in size. Pericardium: There is no evidence of pericardial effusion. Presence of pericardial fat pad. Mitral Valve: The mitral valve is grossly  normal. Mild mitral annular calcification. No evidence of mitral valve regurgitation. No evidence of mitral valve stenosis. Tricuspid Valve: The tricuspid valve is grossly normal. Tricuspid valve regurgitation is not demonstrated. No evidence of tricuspid stenosis. Aortic Valve: The aortic valve is tricuspid. Aortic valve regurgitation is not visualized. Mild aortic valve sclerosis is present, with no evidence of aortic valve stenosis. Pulmonic Valve: The pulmonic valve was grossly normal. Pulmonic valve regurgitation is not visualized. No evidence of pulmonic stenosis. Aorta: The aortic root is normal in size and structure. Venous: The inferior vena cava is normal in size with greater than 50% respiratory variability, suggesting right atrial pressure of 3 mmHg. IAS/Shunts: The atrial septum is grossly normal.  LEFT VENTRICLE PLAX 2D LVIDd:         4.40 cm  Diastology LVIDs:         2.70 cm  LV e' lateral:   9.46 cm/s LV PW:         0.90 cm  LV E/e' lateral: 8.6 LV IVS:        0.70 cm  LV e' medial:    8.05 cm/s LVOT diam:     2.00 cm  LV E/e' medial:  10.1 LV SV:         74 LV SV Index:   43 LVOT Area:     3.14 cm  RIGHT VENTRICLE RV S prime:     17.10 cm/s TAPSE (M-mode): 2.5 cm LEFT ATRIUM             Index  RIGHT ATRIUM           Index LA diam:        3.40 cm 1.98 cm/m  RA Area:     11.40 cm LA Vol (A2C):   26.9 ml 15.68 ml/m RA Volume:   23.90 ml  13.93 ml/m LA Vol (A4C):   38.5 ml 22.44 ml/m LA Biplane Vol: 33.5 ml 19.53 ml/m  AORTIC VALVE LVOT Vmax:   111.00 cm/s LVOT Vmean:  70.600 cm/s LVOT VTI:    0.234 m MITRAL VALVE MV Area (PHT): 3.12 cm    SHUNTS MV Decel Time: 243 msec    Systemic VTI:  0.23 m MV E velocity: 81.00 cm/s  Systemic Diam: 2.00 cm MV A velocity: 99.80 cm/s MV E/A ratio:  0.81 Eleonore Chiquito MD Electronically signed by Eleonore Chiquito MD Signature Date/Time: 02/14/2020/4:33:15 PM    Final    VAS [place] LOWER EXTREMITY VENOUS (DVT)  Result Date: 02/23/2020  Lower Venous DVTStudy  Indications: Pulmonary embolism.  Limitations: Patient positioning. Comparison Study: no prior Performing Technologist: [name] RVS  Examination Guidelines: A complete evaluation includes B-mode imaging, spectral Doppler, color Doppler, and power Doppler as needed of all accessible portions of each vessel. Bilateral testing is considered an integral part of a complete examination. Limited examinations for reoccurring indications may be performed as noted. The reflux portion of the exam is performed with the patient in reverse Trendelenburg.  +---------+---------------+---------+-----------+----------+--------------+ RIGHT    CompressibilityPhasicitySpontaneityPropertiesThrombus Aging +---------+---------------+---------+-----------+----------+--------------+ CFV      Full           Yes      Yes                                 +---------+---------------+---------+-----------+----------+--------------+ SFJ      Full                                                        +---------+---------------+---------+-----------+----------+--------------+ FV Prox  Full                                                        +---------+---------------+---------+-----------+----------+--------------+ FV Mid   Full                                                        +---------+---------------+---------+-----------+----------+--------------+ FV DistalFull                                                        +---------+---------------+---------+-----------+----------+--------------+ PFV      Full                                                        +---------+---------------+---------+-----------+----------+--------------+  POP      Full           Yes      Yes                                 +---------+---------------+---------+-----------+----------+--------------+ PTV      Full                                                         +---------+---------------+---------+-----------+----------+--------------+ PERO     Full                                                        +---------+---------------+---------+-----------+----------+--------------+   +---------+---------------+---------+-----------+----------+--------------+ LEFT     CompressibilityPhasicitySpontaneityPropertiesThrombus Aging +---------+---------------+---------+-----------+----------+--------------+ CFV      Full           Yes      Yes                                 +---------+---------------+---------+-----------+----------+--------------+ SFJ      Full                                                        +---------+---------------+---------+-----------+----------+--------------+ FV Prox  Full                                                        +---------+---------------+---------+-----------+----------+--------------+ FV Mid   Full                                                        +---------+---------------+---------+-----------+----------+--------------+ FV DistalFull                                                        +---------+---------------+---------+-----------+----------+--------------+ PFV      Full                                                        +---------+---------------+---------+-----------+----------+--------------+ POP      Full           Yes      Yes                                 +---------+---------------+---------+-----------+----------+--------------+  PTV      Full                                                        +---------+---------------+---------+-----------+----------+--------------+ PERO     Full                                                        +---------+---------------+---------+-----------+----------+--------------+     Summary: BILATERAL: - No evidence of deep vein thrombosis seen in the lower extremities, bilaterally.   *See table(s)  above for measurements and observations. Electronically signed by Harold Barban MD on 02/23/2020 at 5:39:17 PM.    Final    [place] EKG SITE RITE  Result Date: 02/14/2020 If Site Rite image not attached, placement could not be confirmed due to current cardiac rhythm.   Labs:  CBC: Recent Labs    02/20/20 0414 02/20/20 0414 02/21/20 0403 02/22/20 0346 02/24/20 1144 02/24/20 1450  WBC 18.0*  --  19.5* 18.4* 10.2  --   HGB 13.3  --  12.9 13.4 14.1  --   HCT 39.5  --  38.4 40.4 41.4  --   PLT 548*   < > 324 160 24* 25*  26*   < > = values in this interval not displayed.    COAGS: Recent Labs    02/14/20 0028 02/24/20 1450  INR 1.1 1.3*  APTT 40* 42*    BMP: Recent Labs    02/20/20 0414 02/21/20 0403 02/22/20 0346 02/24/20 0920  NA 138 140 139 140  K 3.4* 3.3* 3.3* 2.3*  CL 109 113* 109 106  CO2 21* 20* 21* 21*  GLUCOSE 146* 139* 123* 143*  BUN 11 10 7* 6*  CALCIUM 8.3* 8.2* 8.8* 8.5*  CREATININE 0.81 0.72 0.68 0.73  GFRNONAA >60 >60 >60 >60  GFRAA >60 >60 >60 >60    LIVER FUNCTION TESTS: Recent Labs    02/16/20 1938 02/20/20 0414  BILITOT  --  0.8  AST  --  12*  ALT  --  12  ALKPHOS  --  34*  PROT  --  4.5*  ALBUMIN 3.8 2.9*    TUMOR MARKERS: No results for input(s): AFPTM, CEA, CA199, CHROMGRNA in the last 8760 hours.  Assessment and Plan:  74 y/o F with history of polycythemia vera, recent right lateral ventricle ICB, PE and now found to have  extensive thrombus within the confluence of sinuses and straight sinus as well as extending into the vein of Galen. NIR has been consulted for an image guided arteriogram/venogram with possible intervention - Dr. Karenann Cai has reviewed patient history and imaging and agrees to proceed.  Risks and benefits of cerebral arteriogram/vengram with possible intervention were discussed with the patient's family including, but not limited to bleeding, infection, vascular injury, contrast induced renal failure,  stroke, reperfusion hemorrhage, or even death. This interventional procedure involves the use of X-rays and because of the nature of the planned procedure, it is possible that we will have prolonged use of X-ray fluoroscopy. Potential radiation risks to you include (but are not limited to) the following: - A slightly elevated risk for cancer  several years later in life. This risk is typically less than 0.5% percent. This risk is low in comparison to the normal incidence of human cancer, which is 33% for women and 50% for men according to the [place]. - Radiation induced injury can include skin redness, resembling a rash, tissue breakdown / ulcers and hair loss (which can be temporary or permanent).  The likelihood of either of these occurring depends on the difficulty of the procedure and whether you are sensitive to radiation due to previous procedures, disease, or genetic conditions.  IF your procedure requires a prolonged use of radiation, you will be notified and given written instructions for further action.  It is your responsibility to monitor the irradiated area for the 2 weeks following the procedure and to notify your physician if you are concerned that you have suffered a radiation induced injury.    All of the patient's family's questions were answered, patient's family is agreeable to proceed.  Consent signed and in chart.  Thank you for this interesting consult.  I greatly enjoyed meeting EMMOGENE SIMSON and look forward to participating in their care.  A copy of this report was sent to the requesting provider on this date.  Electronically Signed: Joaquim Nam, PA-C 02/24/2020, 4:29 PM   I spent a total of 40 Minutes  in face to face in clinical consultation, greater than 50% of which was counseling/coordinating care for cerebral arteriogram/venogram with possible intervention.

## 2020-02-24 NOTE — Procedures (Signed)
INTERVENTIONAL NEURORADIOLOGY BRIEF POSTPROCEDURE NOTE  [name]. [name]  Attending: Dr. Pedro Earls  Assistant: None  Diagnosis: Cerebral venous thrombosis  Access site: Distal right radial artery/ Right cephalic vein  Access closure: Tb band for arterial access/Manual pressure for venous access  Anesthesia: General  Medication used: refer to anesthesia documentation for sedation medication.  Complications: None  Estimated blood loss: 150 mL  Specimen: None  Findings: There was partial thrombosis of the right transverse and sigmoid, torcula and complete occlusion of the straight sinus and vein of Galen. Mechanical thrombectomy performed x 5 with recanalization of the deep system and decreased clot burden in the torcula, right transverse and sigmoid sinus.  The patient tolerated the procedure well without incident or complication and is in stable condition.

## 2020-02-25 ENCOUNTER — Inpatient Hospital Stay (HOSPITAL_COMMUNITY): Payer: Medicare HMO

## 2020-02-25 ENCOUNTER — Inpatient Hospital Stay: Payer: Medicare HMO | Admitting: Oncology

## 2020-02-25 ENCOUNTER — Inpatient Hospital Stay: Payer: Medicare HMO

## 2020-02-25 DIAGNOSIS — I615 Nontraumatic intracerebral hemorrhage, intraventricular: Secondary | ICD-10-CM | POA: Diagnosis not present

## 2020-02-25 DIAGNOSIS — I4892 Unspecified atrial flutter: Secondary | ICD-10-CM | POA: Diagnosis not present

## 2020-02-25 LAB — TYPE AND SCREEN
ABO/RH(D): O POS
Antibody Screen: NEGATIVE

## 2020-02-25 LAB — CBC WITH DIFFERENTIAL/PLATELET
Abs Immature Granulocytes: 0.16 10*3/uL — ABNORMAL HIGH (ref 0.00–0.07)
Basophils Absolute: 0 10*3/uL (ref 0.0–0.1)
Basophils Relative: 0 %
Eosinophils Absolute: 0 10*3/uL (ref 0.0–0.5)
Eosinophils Relative: 0 %
HCT: 35 % — ABNORMAL LOW (ref 36.0–46.0)
Hemoglobin: 11.6 g/dL — ABNORMAL LOW (ref 12.0–15.0)
Immature Granulocytes: 2 %
Lymphocytes Relative: 7 %
Lymphs Abs: 0.8 10*3/uL (ref 0.7–4.0)
MCH: 30.1 pg (ref 26.0–34.0)
MCHC: 33.1 g/dL (ref 30.0–36.0)
MCV: 90.7 fL (ref 80.0–100.0)
Monocytes Absolute: 0.6 10*3/uL (ref 0.1–1.0)
Monocytes Relative: 6 %
Neutro Abs: 9.1 10*3/uL — ABNORMAL HIGH (ref 1.7–7.7)
Neutrophils Relative %: 85 %
Platelets: 23 10*3/uL — CL (ref 150–400)
RBC: 3.86 MIL/uL — ABNORMAL LOW (ref 3.87–5.11)
RDW: 15.7 % — ABNORMAL HIGH (ref 11.5–15.5)
WBC: 10.6 10*3/uL — ABNORMAL HIGH (ref 4.0–10.5)
nRBC: 0 % (ref 0.0–0.2)

## 2020-02-25 LAB — BASIC METABOLIC PANEL
Anion gap: 11 (ref 5–15)
BUN: 8 mg/dL (ref 8–23)
CO2: 18 mmol/L — ABNORMAL LOW (ref 22–32)
Calcium: 8.3 mg/dL — ABNORMAL LOW (ref 8.9–10.3)
Chloride: 110 mmol/L (ref 98–111)
Creatinine, Ser: 0.8 mg/dL (ref 0.44–1.00)
GFR calc Af Amer: >60 mL/min (ref >60)
GFR calc non Af Amer: >60 mL/min (ref >60)
Glucose, Bld: 140 mg/dL — ABNORMAL HIGH (ref 70–99)
Potassium: 3 mmol/L — ABNORMAL LOW (ref 3.5–5.1)
Sodium: 139 mmol/L (ref 135–145)

## 2020-02-25 LAB — GLUCOSE, CAPILLARY: Glucose-Capillary: 122 mg/dL — ABNORMAL HIGH (ref 70–99)

## 2020-02-25 LAB — APTT
aPTT: 58 seconds — ABNORMAL HIGH (ref 24–36)
aPTT: 67 seconds — ABNORMAL HIGH (ref 24–36)
aPTT: 70 seconds — ABNORMAL HIGH (ref 24–36)

## 2020-02-25 LAB — ABO/RH: ABO/RH(D): O POS

## 2020-02-25 LAB — MAGNESIUM: Magnesium: 2.3 mg/dL (ref 1.7–2.4)

## 2020-02-25 LAB — HEPARIN INDUCED PLATELET AB (HIT ANTIBODY): Heparin Induced Plt Ab: 2.553 OD — ABNORMAL HIGH (ref 0.000–0.400)

## 2020-02-25 MED ORDER — ATORVASTATIN CALCIUM 10 MG PO TABS
10.0000 mg | ORAL_TABLET | Freq: Every day | ORAL | Status: DC
  Administered 2020-02-26: 10 mg via ORAL
  Filled 2020-02-25: qty 1

## 2020-02-25 MED ORDER — POTASSIUM CHLORIDE 20 MEQ/15ML (10%) PO SOLN
30.0000 meq | ORAL | Status: AC
  Administered 2020-02-25 (×2): 30 meq
  Filled 2020-02-25 (×2): qty 30

## 2020-02-25 MED ORDER — LABETALOL HCL 5 MG/ML IV SOLN
10.0000 mg | INTRAVENOUS | Status: DC | PRN
  Administered 2020-02-25 (×2): 10 mg via INTRAVENOUS
  Administered 2020-02-25 – 2020-02-26 (×2): 20 mg via INTRAVENOUS
  Administered 2020-02-26: 10 mg via INTRAVENOUS
  Administered 2020-02-26 – 2020-02-29 (×10): 20 mg via INTRAVENOUS
  Administered 2020-03-01: 08:00:00 10 mg via INTRAVENOUS
  Administered 2020-03-01: 14:00:00 20 mg via INTRAVENOUS
  Administered 2020-03-01 (×2): 10 mg via INTRAVENOUS
  Administered 2020-03-02: 04:00:00 20 mg via INTRAVENOUS
  Administered 2020-03-02: 01:00:00 10 mg via INTRAVENOUS
  Administered 2020-03-02 – 2020-03-06 (×8): 20 mg via INTRAVENOUS
  Filled 2020-02-25 (×32): qty 4

## 2020-02-25 MED ORDER — LORAZEPAM 2 MG/ML IJ SOLN: 0.5000 mg | Freq: Once | INTRAMUSCULAR | Status: AC

## 2020-02-25 MED ORDER — SODIUM CHLORIDE 0.9 % IV SOLN: INTRAVENOUS | Status: DC

## 2020-02-25 MED ORDER — ARGATROBAN 50 MG/50ML IV SOLN
0.4000 ug/kg/min | INTRAVENOUS | Status: DC
  Administered 2020-02-25: 0.5 ug/kg/min via INTRAVENOUS
  Administered 2020-02-26: 0.36 ug/kg/min via INTRAVENOUS
  Administered 2020-02-29: 0.25 ug/kg/min via INTRAVENOUS
  Administered 2020-03-01 – 2020-03-03 (×2): 0.28 ug/kg/min via INTRAVENOUS
  Administered 2020-03-04: 0.35 ug/kg/min via INTRAVENOUS
  Filled 2020-02-25 (×7): qty 50

## 2020-02-25 MED ORDER — LORAZEPAM 2 MG/ML IJ SOLN
INTRAMUSCULAR | Status: AC
  Administered 2020-02-25: 0.5 mg via INTRAVENOUS
  Filled 2020-02-25: qty 1

## 2020-02-25 MED ORDER — LORAZEPAM 2 MG/ML IJ SOLN
1.0000 mg | Freq: Once | INTRAMUSCULAR | Status: AC
  Administered 2020-02-25: 1 mg via INTRAVENOUS

## 2020-02-25 MED ORDER — HYDRALAZINE HCL 20 MG/ML IJ SOLN: 10.0000 mg | Freq: Once | INTRAMUSCULAR | Status: DC

## 2020-02-25 NOTE — Progress Notes (Signed)
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  Patient received heparin with her pheresis (earliest 02/16/2020). She has since developed marked thrombocytopenia Results for MEAH, JIRON (MRN [id number]) as of 02/25/2020 18:11  Ref. Range 02/22/2020 03:46 02/24/2020 11:44 02/24/2020 14:50 02/24/2020 14:50 02/25/2020 02:18  Platelets Latest Ref Range: 150 - 400 K/uL 160 24 (LL) 26 (LL) 25 (LL) 23 (LL)   On 02/22/2020 she was found to have bilateral pulmonary emboli and in light of earlier intraventricular bleed anticoagulation was avoided and IVC filter placed. On 02/24/2020 follow-up head CT suggested thrombosis of the sigmoid sinuses, confirmed by CT venogram. She underwent mechanical thrombectomy 04/15 with recanalization of the deep system. MRI venogram today shows improvement but persistent fairly extensive subocclusive thrombus within the right transverse and sigmoid dural venous sinuses. Poor delineation of the left transverse sinus and portions of the left sigmoid sinus likely reflecting persistent thrombus within these vessels.  At this point the concern re thrombosis outweighs the risk of bleeding and we have started argatroban. We will monitor to keep the dose at the lower end of the therapeutic range. Discussed with pharmacy.  I met with the patient's husband and son (a second son, in [place], has not been able to visit; I have discussed the real danger of a catastrophe here with the charge nurse and we may be able to allow that son to visit at least for today). They understand the clotting system is immensely complex and our interventions may over- or under-shoot, with worsening bleeding or thrombosis. Things could quickly take a turn for the worse. If all goes well the argatroban will halt further thrombosis and allow the patient's own system to slowly resolved the clots. The platelet count should also recover off heparin. I suggested they look for signs of improvement within  the next 3 days.  We will follow with you

## 2020-02-25 NOTE — Progress Notes (Signed)
Patient seen for follow up exam. She has been having some increase in perseverative speech relative to earlier in the shift, per RN. She also had an inappropriate word substitution in one of her sentences.   Exam: BP 124/83   Pulse (!) 164   Temp (!) 97.5 F (36.4 C) (Axillary)   Resp 18   Ht 5\' 2"  (1.575 m)   Wt 66.3 kg   SpO2 93%   BMI 26.73 kg/m   HEENT: [place]/AT Lungs: Respirations unlabored  Neuro: Ment: Alert and fully oriented. Able to name fingers but had some delay when naming index finger. Also with difficulty naming a badge shown to her. Speech with fluent replies to most questions, but did perseverate with two of her responses. Able to follow all simple commands. Good eye contact. Pleasant and cooperative.  CN: EOMI. Phonation intact.  Motor: Upper extremities unremarkable.   A/R: 74 year old female s/p venous sinus thrombectomies Thursday evening.  -- Exam with some perseverative speech, but she is oriented x 5 and speech is otherwise fluent. Overall pattern is most consistent with mild anterior frontal lobe hypofunction, likely due to mild hospital delirium or residual effects of anesthesia.  -- Continue to monitor  10 minutes spent in the assessment of this critically ill patient.   Electronically signed: Dr. Kerney Elbe

## 2020-02-25 NOTE — Progress Notes (Signed)
Progress Note  Patient Name: TAMMI BOULIER Date of Encounter: 02/25/2020  Primary Cardiologist: Quay Burow, MD   Subjective   Only complaint this morning is continued headache which she has had for the last 10 to 12 days.  She denies chest pain or shortness of breath.  She did convert from A. fib with RVR to sinus rhythm yesterday morning.  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  Inpatient Medications    Scheduled Meds: .  stroke: mapping our early stages of recovery book   Does not apply Once  . allopurinol  300 mg Oral Daily  . amiodarone  200 mg Oral BID  . amLODipine  10 mg Oral Daily  . chlorhexidine  15 mL Mouth Rinse BID  . Chlorhexidine Gluconate Cloth  6 each Topical Daily  . fluticasone furoate-vilanterol  1 puff Inhalation Daily  . hydrALAZINE  10-20 mg Intravenous Once  . hydrALAZINE  25 mg Oral TID  . levothyroxine  125 mcg Oral QAC breakfast  . losartan  50 mg Oral BID  . pantoprazole  40 mg Oral Daily  . [START ON 02/26/2020] predniSONE  5 mg Oral Q breakfast   Followed by  . [START ON 02/27/2020] predniSONE  5 mg Oral Q breakfast  . senna-docusate  1 tablet Oral BID  . sodium chloride flush  10-40 mL Intracatheter Q12H  . topiramate  50 mg Oral BID   Continuous Infusions: . diltiazem (CARDIZEM) infusion     PRN Meds: acetaminophen **OR** acetaminophen (TYLENOL) oral liquid 160 mg/5 mL **OR** acetaminophen, butalbital-acetaminophen-caffeine, labetalol, ondansetron (ZOFRAN) IV, sodium chloride flush   Vital Signs    Vitals:   02/25/20 0730 02/25/20 0745 02/25/20 0800 02/25/20 0815  BP: 136/75 137/82 138/80 (!) 143/78  Pulse: 73 82 72 78  Resp:      Temp:      TempSrc:      SpO2: 97% 97% 97% 97%  Weight:      Height:        Intake/Output Summary (Last 24 hours) at 02/25/2020 0835 Last data filed at 02/25/2020 0500 Gross per 24 hour  Intake 1249.9 ml  Output 800 ml  Net 449.9 ml    Last 3 Weights 02/25/2020 02/15/2020 02/14/2020  Weight (lbs) 154 lb 1.6 oz 146 lb 2.6 oz 159 lb 2.8 oz  Weight (kg) 69.9 kg 66.3 kg 72.2 kg      Telemetry    Sinus rhythm- Personally Reviewed  ECG    Not performed today- Personally Reviewed  Physical Exam   GEN: No acute distress.   Neck: No JVD Cardiac: RRR, no murmurs, rubs, or gallops.  Respiratory: Clear to auscultation bilaterally. GI: Soft, nontender, non-distended  MS: No edema; No deformity. Neuro:  Nonfocal  Psych: Normal affect   Labs    High Sensitivity Troponin:  No results for input(s): TROPONINIHS in the last 720 hours.    Chemistry Recent Labs  Lab 02/20/20 0414 02/21/20 0403 02/22/20 0346 02/24/20 0920 02/25/20 0218  NA 138   < > 139 140 139  K 3.4*   < > 3.3* 2.3* 3.0*  CL 109   < > 109 106 110  CO2 21*   < > 21* 21* 18*  GLUCOSE 146*   < > 123* 143* 140*  BUN 11   < > 7* 6* 8  CREATININE 0.81   < > 0.68 0.73 0.80  CALCIUM 8.3*   < > 8.8*  8.5* 8.3*  PROT 4.5*  --   --   --   --   ALBUMIN 2.9*  --   --   --   --   AST 12*  --   --   --   --   ALT 12  --   --   --   --   ALKPHOS 34*  --   --   --   --   BILITOT 0.8  --   --   --   --   GFRNONAA >60   < > >60 >60 >60  GFRAA >60   < > >60 >60 >60  ANIONGAP 8   < > '9 13 11   '$ < > = values in this interval not displayed.     Hematology Recent Labs  Lab 02/22/20 0346 02/22/20 0346 02/24/20 1144 02/24/20 1450 02/25/20 0218  WBC 18.4*  --  10.2  --  10.6*  RBC 4.46  --  4.71  --  3.86*  HGB 13.4  --  14.1  --  11.6*  HCT 40.4  --  41.4  --  35.0*  MCV 90.6  --  87.9  --  90.7  MCH 30.0  --  29.9  --  30.1  MCHC 33.2  --  34.1  --  33.1  RDW 15.4  --  15.0  --  15.7*  PLT 160   < > 24* 25*  26* 23*   < > = values in this interval not displayed.    BNPNo results for input(s): BNP, PROBNP in the last 168 hours.   DDimer  Recent Labs  Lab 02/24/20 1450  DDIMER >20.00*     Radiology    CT Head Wo Contrast  Addendum Date:  02/24/2020   ADDENDUM REPORT: 02/24/2020 15:17 ADDENDUM: These results were called by telephone at the time of interpretation on 02/24/2020 at 2:40 pm to provider PRAMOD SETHI , who verbally acknowledged these results. Electronically Signed   By: Kellie Simmering DO   On: 02/24/2020 15:17   Result Date: 02/24/2020 CLINICAL DATA:  Stroke, follow-up. EXAM: CT HEAD WITHOUT CONTRAST TECHNIQUE: Contiguous axial images were obtained from the base of the skull through the vertex without intravenous contrast. COMPARISON:  Head CT 02/19/2020, brain MRI 02/14/2020 FINDINGS: Brain: Interval decrease in conspicuity of intraventricular hemorrhage again most notably within the atrium and temporal horn of the right lateral ventricle. Redemonstrated small volume acute hemorrhage layering within the occipital horns. No ventriculomegaly. No midline shift. Redemonstrated advanced cerebral white matter hypodensity. No demarcated cortical infarct. Cerebral volume is normal for age. Vascular: Atherosclerotic arterial calcifications. New as compared to prior examination 02/19/2020 there is increased density of the straight sinus, vein of Galen and inferior sagittal sinus. Hyperdensity is also questioned within the lateral left transverse sinus. Skull: Normal. Negative for fracture or focal lesion. Sinuses/Orbits: Frothy secretions layering within the right maxillary sinus. No significant mastoid effusion. IMPRESSION: Increased density of the straight sinus, vein of Galen and inferior sagittal sinus. Hyperdensity is also questioned within portions of the lateral left transverse sinus. Findings are suspicious for dural venous sinus thrombosis. Consider CT venography for further evaluation. Interval decrease in conspicuity of intraventricular hemorrhage again most notable within the atrium and temporal horn of the right lateral ventricle. No ventriculomegaly. Redemonstrated severe cerebral white matter disease. Electronically Signed: By: Kellie Simmering DO On: 02/24/2020 14:34   CT VENOGRAM HEAD  Result Date: 02/24/2020 CLINICAL DATA:  Stroke, follow-up.  EXAM: CT VENOGRAM HEAD TECHNIQUE: Postcontrast CT venography of the head was performed. Coronal and sagittal thin reconstructions were also submitted for evaluation. Additionally, axial, coronal and sagittal thick MIP reconstructions were submitted for evaluation. CONTRAST:  62m OMNIPAQUE IOHEXOL 300 MG/ML  SOLN COMPARISON:  Noncontrast head CT performed earlier the same day 02/24/2020. FINDINGS: There is confirmed extensive thrombus within the confluence of sinuses and straight sinus also extending into the vein of Galen. There is also fairly extensive multifocal thrombus within the right greater than left transverse and sigmoid dural venous sinuses. Some enhancement is seen within the internal cerebral veins and proximal basal veins of Rosenthal. No definite thrombus is identified within the superior or inferior sagittal sinuses. Enhancement is seen within the visualized upper jugular veins bilaterally. IMPRESSION: Confirmed extensive thrombus within the confluence of sinuses and straight sinus, also extending into the vein of Galen. There is also fairly extensive multifocal thrombus within the right greater than left transverse and sigmoid dural venous sinuses. Electronically Signed   By: KKellie SimmeringDO   On: 02/24/2020 16:37   VAS [place]OWER EXTREMITY VENOUS (DVT)  Result Date: 02/23/2020  Lower Venous DVTStudy Indications: Pulmonary embolism.  Limitations: Patient positioning. Comparison Study: no prior Performing Technologist: MAbram SanderRVS  Examination Guidelines: A complete evaluation includes B-mode imaging, spectral Doppler, color Doppler, and power Doppler as needed of all accessible portions of each vessel. Bilateral testing is considered an integral part of a complete examination. Limited examinations for reoccurring indications may be performed as noted. The reflux portion of the  exam is performed with the patient in reverse Trendelenburg.  +---------+---------------+---------+-----------+----------+--------------+ RIGHT    CompressibilityPhasicitySpontaneityPropertiesThrombus Aging +---------+---------------+---------+-----------+----------+--------------+ CFV      Full           Yes      Yes                                 +---------+---------------+---------+-----------+----------+--------------+ SFJ      Full                                                        +---------+---------------+---------+-----------+----------+--------------+ FV Prox  Full                                                        +---------+---------------+---------+-----------+----------+--------------+ FV Mid   Full                                                        +---------+---------------+---------+-----------+----------+--------------+ FV DistalFull                                                        +---------+---------------+---------+-----------+----------+--------------+ PFV      Full                                                        +---------+---------------+---------+-----------+----------+--------------+  POP      Full           Yes      Yes                                 +---------+---------------+---------+-----------+----------+--------------+ PTV      Full                                                        +---------+---------------+---------+-----------+----------+--------------+ PERO     Full                                                        +---------+---------------+---------+-----------+----------+--------------+   +---------+---------------+---------+-----------+----------+--------------+ LEFT     CompressibilityPhasicitySpontaneityPropertiesThrombus Aging +---------+---------------+---------+-----------+----------+--------------+ CFV      Full           Yes      Yes                                  +---------+---------------+---------+-----------+----------+--------------+ SFJ      Full                                                        +---------+---------------+---------+-----------+----------+--------------+ FV Prox  Full                                                        +---------+---------------+---------+-----------+----------+--------------+ FV Mid   Full                                                        +---------+---------------+---------+-----------+----------+--------------+ FV DistalFull                                                        +---------+---------------+---------+-----------+----------+--------------+ PFV      Full                                                        +---------+---------------+---------+-----------+----------+--------------+ POP      Full           Yes      Yes                                 +---------+---------------+---------+-----------+----------+--------------+  PTV      Full                                                        +---------+---------------+---------+-----------+----------+--------------+ PERO     Full                                                        +---------+---------------+---------+-----------+----------+--------------+     Summary: BILATERAL: - No evidence of deep vein thrombosis seen in the lower extremities, bilaterally.   *See table(s) above for measurements and observations. Electronically signed by Harold Barban MD on 02/23/2020 at 5:39:17 PM.    Final     Cardiac Studies   2D echocardiogram (02/14/2020)  IMPRESSIONS    1. Left ventricular ejection fraction, by estimation, is 65 to 70%. The  left ventricle has normal function. The left ventricle has no regional  wall motion abnormalities. Left ventricular diastolic parameters were  normal.  2. Right ventricular systolic function is normal. The right ventricular  size is normal.  Tricuspid regurgitation signal is inadequate for assessing  PA pressure.  3. The mitral valve is grossly normal. No evidence of mitral valve  regurgitation. No evidence of mitral stenosis.  4. The aortic valve is tricuspid. Aortic valve regurgitation is not  visualized. Mild aortic valve sclerosis is present, with no evidence of  aortic valve stenosis.  5. The inferior vena cava is normal in size with greater than 50%  respiratory variability, suggesting right atrial pressure of 3 mmHg.   Conclusion(s)/Recommendation(s): No intracardiac source of embolism  detected on this transthoracic study. A transesophageal echocardiogram is  recommended to exclude cardiac source of embolism if clinically indicated.  Patient Profile     RANDILYN FOISY is a 74 y.o. female with a hx of hypertension, hyperlipidemia history of cardiac issues or cardiac evaluation, who was admitted 02/14/2020 intraventricular hemorrhage in the setting of severe thrombocytosis, platelet count initially 990,000, hemoglobin initially 17.7, who we saw because of A. fib with RVR.  She converted with IV amiodarone early yesterday morning and it was transitioned to p.o. amiodarone maintaining sinus rhythm.  Assessment & Plan    1: A. fib with RVR-converted to sinus rhythm earlier this morning on IV amiodarone.  She has been transitioned to p.o. which we will load her with 200 mg p.o. twice daily for 1 week.  She is not a candidate for oral anticoagulation at this point because of her intracranial bleed and thrombocytopenia.  2: Essential hypertension-blood pressure much higher after converting to sinus rhythm currently 126/73.  She is on high-dose amlodipine, hydralazine, losartan.  We may add beta-blocker for better blood pressure every control  3: Hypokalemia-she received 40 mEq of potassium yesterday.  We will recheck a basic metabolic panel and magnesium today.  Potassium was 3.0 after repletion yesterday we will replete again  today and recheck be met in the morning.  4: Intraventricular hemorrhage-per neurology.  Patient has persistent headache but this precludes our ability to anticoagulate her.  She had CT scan yesterday because of ongoing headache and IR procedure with thrombectomy of her thrombosed sinus.  She remains in sinus rhythm on p.o.  amiodarone.  We will sign off but be available for further rhythm abnormalities.      For questions or updates, please contact [place] Please consult [email or web address] for contact info under        Signed, Quay Burow, MD  02/25/2020, 8:35 AM

## 2020-02-25 NOTE — Progress Notes (Signed)
This RN entered the room at 0459 to administer pt's PRN tylenol for previous complaints of headache and schedule potassium replacement. Pt awakened easily and was able to follow commands and shake head appropriately to questions. When I gave patient a drink of water pt repeatedly said "this water tastes.. this water tastes.." and was unable to complete sentence for approx. 3 minutes. Pt finally said "broccoli. This water wastes like broccoli."   Pt was neuro intact at 0400 when I was in the room resassessing her- able to follow commands equally and bilaterally, and answer all orientation questions. At 0459 when asked patient's name and date of birth, patient could recite name but said continuously said her birth date was "[place]". Pt unable to speak in full sentences and appeared to be having expressive aphasia symptoms. Unable to identify objects (watch, flashlight, and marker). Pt was able to follow commands equally (moderate grip and dorsiflexion) and bilaterally.   This RN called RRT to come assess patient's neuro status. RRT completed his neuro exam. Dr. Cheral Marker came to bedside. MD concluded pt's neuro exam was more consistent with altered mental status and not acute changes warranting repeat scan at this time.   Will continue to monitor patient closely.

## 2020-02-25 NOTE — Progress Notes (Signed)
I was notified by [name] of the patients inability to answer questions. [name] was complaining of a HA which is not new, and has some delayed answers with her slight left droop. She could identify objects however she would perseverate some when answering. I notified Dr. Cheral Marker and he came to the bedside.

## 2020-02-25 NOTE — Progress Notes (Signed)
Patient stayed very restless at the beginning of MRI, pulling EKG leads, gown, blankets, swinging arms and kicking her legs up. We were unable to continue MRI at that point. MD was notified and order was received to give 0.5 mg IV Ativan initially if does not work give another 1mg  IV Ativan. Patient received totally 1.5 mg IV Ativan. MRI was completed

## 2020-02-25 NOTE — Progress Notes (Signed)
[place]  [phone] [phone]     ID: [name] DOB: 08-07-1946  MR#: [id number]  GYJ#:[id number]  Patient Care Team: Cari Caraway, MD as PCP - General (Family Medicine) Lorretta Harp, MD as PCP - Cardiology (Cardiology) [name], NP OTHER MD:  CHIEF COMPLAINT: Follow-up intracranial bleed, polycythemia  CURRENT TREATMENT: Pheresis, Hydrea, therapeutic phlebotomy  INTERVAL HISTORY: [name] had a repeat CT of the head yesterday which showed extensive thrombus within the confluence of the sinuses and straight sinus and also extending into the vein and Galen.  She also had fairly extensive multifocal thrombus within the right greater than left transverse and sigmoid dural venous sinuses.  Status post emergent mechanical thrombectomy of the right transverse sinus, sigmoid sinus, torcula, straight sinus, and vena Galen with recanalization of the deep system and decreased clot burden in the left, right transverse, and sigmoid sinus on 02/24/2020 by IR.  She has some difficulty with word finding overnight thought to be due to mild anterior frontal lobe hypofunction secondary to mild hospital delirium or residual effects of anesthesia.  REVIEW OF SYSTEMS: [name] is sedated today.  She just returned from the MRI and had Ativan as a premedication.  Spoke with nursing who has not noticed any bleeding.  Her headache is at baseline.  Nursing reports that the difficulty with word finding has resolved.  HISTORY OF CURRENT ILLNESS: From the original consult note:  [name] has a history of hypertension followed by Dr. Theadore Nan.  For the last few days she has had headache and she took her blood pressure and it was as high as 168/102 which is unusual for her. As this was not improving she presented to the emergency room last night 02/13/2020. Dr Roxanne Mins obtained a basic lab work and a head CT.  The head CT without contrast showed an acute  intraventricular hemorrhage in the right lateral ventricle, with no parenchymal component.  The lab work showed a white cell count of 12.0, hemoglobin 17.7, and platelets 990,000.  We were consulted for further evaluation and treatment.  PAST MEDICAL HISTORY: Past Medical History:  Diagnosis Date  . Hepatitis C   . Hyperlipidemia   . Hypertension   . Osteoporosis     PAST SURGICAL HISTORY: Past Surgical History:  Procedure Laterality Date  . IR IVC FILTER PLMT / S&I /IMG GUID/MOD SED  02/22/2020  . RADIOLOGY WITH ANESTHESIA N/A 02/24/2020   Procedure: IR WITH ANESTHESIA;  Surgeon: Radiologist, Medication, MD;  Location: [place];  Service: Radiology;  Laterality: N/A;    FAMILY HISTORY History reviewed. No pertinent family history. The patient's father died at age 65 and the patient's mother at age 108 both from heart disease.  The patient has 1 sister, no brothers.  There is no family history of blood problems and no family history of cancer to the patient's knowledge  GYNECOLOGIC HISTORY:  No LMP recorded. Patient is postmenopausal. Menarche: 74 years old Age at first live birth: 74 years old [place] P 2 LMP 36 HRT yes, a few years  Hysterectomy?  Salpingo-oophorectomy?  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  Her husband [name] ran a business but is now retired.  Their son [name] is a Adult nurse.  Their son Aaron Edelman is an Pharmacologist.  The patient has 1 grandson and 4 step grandchildren.  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    ADVANCED DIRECTIVES: In the absence of any documents to the contrary  the patient's husband is her healthcare power of attorney   HEALTH MAINTENANCE: Social History   Tobacco Use  . Smoking status: Former Research scientist (life sciences)  . Smokeless tobacco: Never Used  Substance Use Topics  . Alcohol use: No  . Drug use: No     Colonoscopy: [place]  PAP: [place]  Bone density:  Mammogram: Due   Allergies  Allergen Reactions  . Erythromycin  Itching and Other (See Comments)    Severe stomach pains, diarrhea    Current Facility-Administered Medications  Medication Dose Route Frequency Provider Last Rate Last Admin  .  stroke: mapping our early stages of recovery book   Does not apply Once Barrett, Rhonda G, PA-C      . 0.9 %  sodium chloride infusion   Intravenous Continuous Garvin Fila, MD      . acetaminophen (TYLENOL) tablet 650 mg  650 mg Oral Q6H PRN Barrett, Rhonda G, PA-C   325 mg at 02/24/20 2254   Or  . acetaminophen (TYLENOL) 160 MG/5ML solution 650 mg  650 mg Per Tube Q6H PRN Barrett, Rhonda G, PA-C   650 mg at 02/25/20 0537   Or  . acetaminophen (TYLENOL) suppository 650 mg  650 mg Rectal Q6H PRN Barrett, Rhonda G, PA-C      . allopurinol (ZYLOPRIM) tablet 300 mg  300 mg Oral Daily Barrett, Rhonda G, PA-C   300 mg at 02/25/20 0938  . amiodarone (PACERONE) tablet 200 mg  200 mg Oral BID Marianna Payment, MD   200 mg at 02/25/20 7425  . amLODipine (NORVASC) tablet 10 mg  10 mg Oral Daily Barrett, Rhonda G, PA-C   10 mg at 02/25/20 9563  . atorvastatin (LIPITOR) tablet 10 mg  10 mg Oral Daily Lorretta Harp, MD      . butalbital-acetaminophen-caffeine (FIORICET) [phone] MG per tablet 1 tablet  1 tablet Oral Q8H PRN Barrett, Evelene Croon, PA-C   1 tablet at 02/25/20 1018  . chlorhexidine (PERIDEX) 0.12 % solution 15 mL  15 mL Mouth Rinse BID Barrett, Rhonda G, PA-C   15 mL at 02/25/20 0940  . Chlorhexidine Gluconate Cloth 2 % PADS 6 each  6 each Topical Daily Garvin Fila, MD   6 each at 02/25/20 1019  . diltiazem (CARDIZEM) 125 mg in dextrose 5% 125 mL (1 mg/mL) infusion  5-15 mg/hr Intravenous Titrated Barrett, Rhonda G, PA-C      . fluticasone furoate-vilanterol (BREO ELLIPTA) 200-25 MCG/INH 1 puff  1 puff Inhalation Daily Barrett, Rhonda G, PA-C   1 puff at 02/25/20 0811  . hydrALAZINE (APRESOLINE) injection 10-20 mg  10-20 mg Intravenous Once Stretch, Marily Lente, MD   Stopped at 02/25/20 [phone]  . hydrALAZINE  (APRESOLINE) tablet 25 mg  25 mg Oral TID Barrett, Rhonda G, PA-C   25 mg at 02/25/20 0937  . labetalol (NORMODYNE) injection 10-20 mg  10-20 mg Intravenous Q2H PRN Stretch, Marily Lente, MD   10 mg at 02/25/20 0240  . levothyroxine (SYNTHROID) tablet 125 mcg  125 mcg Oral QAC breakfast Barrett, Evelene Croon, PA-C   125 mcg at 02/25/20 0653  . LORazepam (ATIVAN) 2 MG/ML injection           . LORazepam (ATIVAN) injection 0.5 mg  0.5 mg Intravenous Once Antony Contras S, MD      . LORazepam (ATIVAN) injection 1 mg  1 mg Intravenous Once Garvin Fila, MD      . losartan (COZAAR) tablet 50 mg  50  mg Oral BID Barrett, Evelene Croon, PA-C   50 mg at 02/25/20 2751  . ondansetron (ZOFRAN) injection 4 mg  4 mg Intravenous Q4H PRN Barrett, Rhonda G, PA-C   4 mg at 02/18/20 1334  . pantoprazole (PROTONIX) EC tablet 40 mg  40 mg Oral Daily Barrett, Rhonda G, PA-C   40 mg at 02/25/20 0938  . [START ON 02/26/2020] predniSONE (DELTASONE) tablet 5 mg  5 mg Oral Q breakfast Barrett, Rhonda G, PA-C       Followed by  . [START ON 02/27/2020] predniSONE (DELTASONE) tablet 5 mg  5 mg Oral Q breakfast Barrett, Rhonda G, PA-C      . senna-docusate (Senokot-S) tablet 1 tablet  1 tablet Oral BID Barrett, Evelene Croon, PA-C   1 tablet at 02/24/20 2225  . sodium chloride flush (NS) 0.9 % injection 10-40 mL  10-40 mL Intracatheter Q12H Barrett, Rhonda G, PA-C   10 mL at 02/25/20 0939  . sodium chloride flush (NS) 0.9 % injection 10-40 mL  10-40 mL Intracatheter PRN Barrett, Rhonda G, PA-C      . topiramate (TOPAMAX) tablet 50 mg  50 mg Oral BID Garvin Fila, MD   50 mg at 02/25/20 7001    OBJECTIVE: .white woman examined in bed  Vitals:   02/25/20 1100 02/25/20 1115  BP: (!) 121/55 (!) 119/56  Pulse: 77 93  Resp:    Temp:    SpO2: 96% 95%     Body mass index is 28.19 kg/m.   Wt Readings from Last 3 Encounters:  02/25/20 69.9 kg  08/15/14 67.6 kg    LAB RESULTS:  CMP     Component Value Date/Time   NA 139 02/25/2020  0218   K 3.0 (L) 02/25/2020 0218   CL 110 02/25/2020 0218   CO2 18 (L) 02/25/2020 0218   GLUCOSE 140 (H) 02/25/2020 0218   BUN 8 02/25/2020 0218   CREATININE 0.80 02/25/2020 0218   CALCIUM 8.3 (L) 02/25/2020 0218   PROT 4.5 (L) 02/20/2020 0414   ALBUMIN 2.9 (L) 02/20/2020 0414   AST 12 (L) 02/20/2020 0414   ALT 12 02/20/2020 0414   ALKPHOS 34 (L) 02/20/2020 0414   BILITOT 0.8 02/20/2020 0414   GFRNONAA >60 02/25/2020 0218   GFRAA >60 02/25/2020 0218    No results found for: TOTALPROTELP, ALBUMINELP, A1GS, A2GS, BETS, BETA2SER, GAMS, MSPIKE, SPEI  No results found for: KPAFRELGTCHN, LAMBDASER, KAPLAMBRATIO  Lab Results  Component Value Date   WBC 10.6 (H) 02/25/2020   NEUTROABS 9.1 (H) 02/25/2020   HGB 11.6 (L) 02/25/2020   HCT 35.0 (L) 02/25/2020   MCV 90.7 02/25/2020   PLT 23 (LL) 02/25/2020    '@LASTCHEMISTRY' @  No results found for: LABCA2  No components found for: [id number]  Recent Labs  Lab 02/24/20 1450  INR 1.3*    No results found for: LABCA2  No results found for: FFM384  No results found for: YKZ993  No results found for: TTS177  No results found for: CA2729  No components found for: HGQUANT  No results found for: CEA1 / No results found for: CEA1   No results found for: AFPTUMOR  No results found for: CHROMOGRNA  No results found for: PSA1  No results displayed because visit has over 200 results.      (this displays the last labs from the last 3 days)  No results found for: TOTALPROTELP, ALBUMINELP, A1GS, A2GS, BETS, BETA2SER, GAMS, MSPIKE, SPEI (this displays SPEP labs)  No results found for: KPAFRELGTCHN, LAMBDASER, KAPLAMBRATIO (kappa/lambda light chains)  No results found for: HGBA, HGBA2QUANT, HGBFQUANT, HGBSQUAN (Hemoglobinopathy evaluation)   No results found for: LDH  Lab Results  Component Value Date   IRON 73 02/14/2020   TIBC 358 02/14/2020   IRONPCTSAT 20 02/14/2020   (Iron and TIBC)  Lab Results  Component  Value Date   FERRITIN 75 02/14/2020    Urinalysis    Component Value Date/Time   COLORURINE YELLOW 02/19/2020 1623   APPEARANCEUR CLEAR 02/19/2020 1623   LABSPEC 1.015 02/19/2020 1623   PHURINE 6.0 02/19/2020 1623   GLUCOSEU NEGATIVE 02/19/2020 1623   HGBUR NEGATIVE 02/19/2020 1623   BILIRUBINUR NEGATIVE 02/19/2020 1623   KETONESUR 20 (A) 02/19/2020 1623   PROTEINUR NEGATIVE 02/19/2020 1623   UROBILINOGEN 0.2 08/15/2014 2129   NITRITE NEGATIVE 02/19/2020 1623   LEUKOCYTESUR NEGATIVE 02/19/2020 1623     STUDIES: CT ANGIO HEAD W OR WO CONTRAST  Result Date: 02/19/2020 CLINICAL DATA:  Intraventricular hemorrhage EXAM: CT ANGIOGRAPHY HEAD TECHNIQUE: Multidetector CT imaging of the head was performed using the standard protocol during bolus administration of intravenous contrast. Multiplanar CT image reconstructions and MIPs were obtained to evaluate the vascular anatomy. CONTRAST:  30m OMNIPAQUE IOHEXOL 350 MG/ML SOLN COMPARISON:  None. FINDINGS: Artifact is present. CTA HEAD Anterior circulation: Intracranial internal carotid arteries are patent with mild calcified plaque. Anterior and middle cerebral arteries are patent. Posterior circulation: Intracranial vertebral arteries, basilar artery, and posterior cerebral arteries are patent. Venous sinuses: Not opacified. IMPRESSION: Suboptimal evaluation due to artifact. Patent anterior and posterior circulations. No abnormal vascularity identified. Electronically Signed   By: PMacy MisM.D.   On: 02/19/2020 13:09   DG Chest 2 View  Result Date: 02/22/2020 CLINICAL DATA:  Shortness of breath EXAM: CHEST - 2 VIEW COMPARISON:  February 14, 2020 FINDINGS: There is atelectatic change in the right base. Lungs elsewhere are clear. Heart size and pulmonary vascularity are normal. No adenopathy. No bone lesions. IMPRESSION: Right base atelectasis. Lungs elsewhere clear. Cardiac silhouette within normal limits. Electronically Signed   By: WLowella GripIII M.D.   On: 02/22/2020 09:20   CT Head Wo Contrast  Addendum Date: 02/24/2020   ADDENDUM REPORT: 02/24/2020 15:17 ADDENDUM: These results were called by telephone at the time of interpretation on 02/24/2020 at 2:40 pm to provider PRAMOD SETHI , who verbally acknowledged these results. Electronically Signed   By: KKellie SimmeringDO   On: 02/24/2020 15:17   Result Date: 02/24/2020 CLINICAL DATA:  Stroke, follow-up. EXAM: CT HEAD WITHOUT CONTRAST TECHNIQUE: Contiguous axial images were obtained from the base of the skull through the vertex without intravenous contrast. COMPARISON:  Head CT 02/19/2020, brain MRI 02/14/2020 FINDINGS: Brain: Interval decrease in conspicuity of intraventricular hemorrhage again most notably within the atrium and temporal horn of the right lateral ventricle. Redemonstrated small volume acute hemorrhage layering within the occipital horns. No ventriculomegaly. No midline shift. Redemonstrated advanced cerebral white matter hypodensity. No demarcated cortical infarct. Cerebral volume is normal for age. Vascular: Atherosclerotic arterial calcifications. New as compared to prior examination 02/19/2020 there is increased density of the straight sinus, vein of Galen and inferior sagittal sinus. Hyperdensity is also questioned within the lateral left transverse sinus. Skull: Normal. Negative for fracture or focal lesion. Sinuses/Orbits: Frothy secretions layering within the right maxillary sinus. No significant mastoid effusion. IMPRESSION: Increased density of the straight sinus, vein of Galen and inferior sagittal sinus. Hyperdensity is also questioned within portions of the  lateral left transverse sinus. Findings are suspicious for dural venous sinus thrombosis. Consider CT venography for further evaluation. Interval decrease in conspicuity of intraventricular hemorrhage again most notable within the atrium and temporal horn of the right lateral ventricle. No ventriculomegaly.  Redemonstrated severe cerebral white matter disease. Electronically Signed: By: Kellie Simmering DO On: 02/24/2020 14:34   CT HEAD WO CONTRAST  Result Date: 02/19/2020 CLINICAL DATA:  74 year old female who presented with right side intraventricular hemorrhage on 02/14/2020. Altered mental status. History of polycythemia. Subsequent encounter. EXAM: CT HEAD WITHOUT CONTRAST TECHNIQUE: Contiguous axial images were obtained from the base of the skull through the vertex without intravenous contrast. COMPARISON:  CTA head and neck earlier today. Head CT yesterday, and earlier. FINDINGS: Brain: Continued stable volume of right lateral intraventricular hemorrhage. Small volume of layering hemorrhage in the contralateral left occipital horn is stable. No other intraventricular blood at this time. No ventriculomegaly. Pronounced bilateral cerebral white matter hypodensity is stable. No significant intracranial mass effect. Stable gray-white matter differentiation throughout the brain. No cortically based acute infarct identified. Vascular: Stable. Mild Calcified atherosclerosis at the skull base. Skull: Stable, negative. Sinuses/Orbits: Visualized paranasal sinuses and mastoids are stable and well pneumatized. Other: No acute orbit or scalp soft tissue finding. IMPRESSION: 1. Stable since 02/18/2020. Unchanged volume of IVH primarily in the right lateral ventricle. No intracranial mass effect. 2. Severe cerebral white matter disease. Numerous chronic microhemorrhages throughout the brain also demonstrated recently by MRI. Electronically Signed   By: Genevie Ann M.D.   On: 02/19/2020 20:39   CT HEAD WO CONTRAST  Result Date: 02/18/2020 CLINICAL DATA:  74 year old female who presented with right side intraventricular hemorrhage on 02/14/2020. History of polycythemia. Subsequent encounter. EXAM: CT HEAD WITHOUT CONTRAST TECHNIQUE: Contiguous axial images were obtained from the base of the skull through the vertex without  intravenous contrast. COMPARISON:  Head CT 02/16/2020 and earlier. FINDINGS: Brain: Continued stable appearance of right lateral intraventricular hemorrhage. Since 02/16/2020 a small volume of 4th ventricular blood has cleared. No significant 3rd ventricular blood is evident. Layering blood in the left occipital horn is stable. Stable ventricle size and configuration. No definite transependymal edema. No parenchymal extension of blood or new intracranial hemorrhage identified. Confluent bilateral cerebral white matter hypodensity is stable. No midline shift. Basilar cisterns remain patent. No cortically based acute infarct identified. Vascular: Calcified atherosclerosis at the skull base. Conspicuous increased vascular density probably reflects known polycythemia. Skull: Stable, negative. Sinuses/Orbits: Stable sinus aeration. Other: Visualized orbits and scalp soft tissues are within normal limits. IMPRESSION: 1. Unchanged size and configuration of right lateral intraventricular hemorrhage since 02/16/2020. Small volume of intraventricular hemorrhage elsewhere has regressed. No ventriculomegaly or transependymal edema. 2. No new intracranial abnormality. 3. Advanced white matter disease, and numerous chronic microhemorrhages throughout the brain demonstrated on recent MRI. Electronically Signed   By: Genevie Ann M.D.   On: 02/18/2020 20:11   CT HEAD WO CONTRAST  Result Date: 02/16/2020 CLINICAL DATA:  Intracranial hemorrhage, follow-up EXAM: CT HEAD WITHOUT CONTRAST TECHNIQUE: Contiguous axial images were obtained from the base of the skull through the vertex without intravenous contrast. COMPARISON:  02/14/2020 FINDINGS: Brain: Intraventricular hemorrhage is again identified primarily within the right lateral ventricle. There is no new hemorrhage. Caliber of the ventricles is similar. No new loss of gray differentiation. Confluent hypoattenuation is again identified supratentorial white matter. Vascular: No new  finding. Skull: Remains unremarkable. Sinuses/Orbits: No acute finding. Other: None. IMPRESSION: No substantial change in intraventricular hemorrhage primarily within  the right lateral ventricle. No new hemorrhage. Stable caliber of the ventricles. Electronically Signed   By: Macy Mis M.D.   On: 02/16/2020 07:09   CT HEAD WO CONTRAST  Result Date: 02/14/2020 CLINICAL DATA:  Follow-up hemorrhage EXAM: CT HEAD WITHOUT CONTRAST TECHNIQUE: Contiguous axial images were obtained from the base of the skull through the vertex without intravenous contrast. COMPARISON:  Earlier same day, 8 hours ago. FINDINGS: Brain: Intraventricular hemorrhage within the right lateral ventricle persists, approximately the same volume without evidence of ongoing or additional bleeding. Small amount of blood present within the fourth ventricle and left lateral ventricle. Small amount of subarachnoid blood visible within a left parietal sulcus. No overall change in ventricular size. Widespread chronic microangiopathic change of the white matter as seen previously. Old small vessel infarction in the right basal ganglia. No sign of acute infarction. I do not identify an intraparenchymal source. Vascular: There is atherosclerotic calcification of the major vessels at the base of the brain. Skull: Negative Sinuses/Orbits: Clear/normal Other: None IMPRESSION: No significant change since 8 hours ago. Intraventricular hemorrhage primarily within the right lateral ventricle. No evidence of ongoing bleeding. Small amount of blood dependent within the left lateral ventricle and within the fourth ventricle. Ventricular size is stable. Small amount of subarachnoid blood evident in a left parietal sulcus. Electronically Signed   By: Nelson Chimes M.D.   On: 02/14/2020 08:57   CT Head Wo Contrast  Result Date: 02/14/2020 CLINICAL DATA:  Acute onset headache EXAM: CT HEAD WITHOUT CONTRAST TECHNIQUE: Contiguous axial images were obtained from the  base of the skull through the vertex without intravenous contrast. COMPARISON:  None. FINDINGS: Brain: There is acute hemorrhage within the right lateral ventricle. No definite intraparenchymal component is identified. There is severe chronic white matter disease, most commonly indicating chronic ischemic microangiopathy. Volume of CSF spaces is normal. No midline shift or other mass effect Vascular: No abnormal hyperdensity of the major intracranial arteries or dural venous sinuses. No intracranial atherosclerosis. Skull: The visualized skull base, calvarium and extracranial soft tissues are normal. Sinuses/Orbits: No fluid levels or advanced mucosal thickening of the visualized paranasal sinuses. No mastoid or middle ear effusion. The orbits are normal. IMPRESSION: 1. Acute intraventricular hemorrhage within the right lateral ventricle. No definite parenchymal component identified. This is an unusual pattern, but is perhaps less unlikely in the setting of this patient's abnormal coagulation status. 2. Severe chronic ischemic microangiopathy. Critical Value/emergent results were called by telephone at the time of interpretation on 02/14/2020 at 12:31 am to provider DAVID [facility] , who verbally acknowledged these results. Electronically Signed   By: Ulyses Jarred M.D.   On: 02/14/2020 00:32   CT ANGIO CHEST PE W OR WO CONTRAST  Addendum Date: 02/22/2020   ADDENDUM REPORT: 02/22/2020 12:39 ADDENDUM: Critical Value/emergent results were called by telephone at the time of interpretation on 02/22/2020 at 12:32 pm to provider Dr. Leonie Man, who verbally acknowledged these results. Electronically Signed   By: Marijo Conception M.D.   On: 02/22/2020 12:39   Result Date: 02/22/2020 CLINICAL DATA:  Shortness of breath. EXAM: CT ANGIOGRAPHY CHEST WITH CONTRAST TECHNIQUE: Multidetector CT imaging of the chest was performed using the standard protocol during bolus administration of intravenous contrast. Multiplanar CT image  reconstructions and MIPs were obtained to evaluate the vascular anatomy. CONTRAST:  154m OMNIPAQUE IOHEXOL 350 MG/ML SOLN COMPARISON:  None. FINDINGS: Cardiovascular: Filling defects are noted in the lower lobe branches of both pulmonary arteries consistent with acute pulmonary  emboli. Normal cardiac size. No pericardial effusion. Coronary artery calcifications are noted. Atherosclerosis of thoracic aorta is noted without aneurysm formation. Mediastinum/Nodes: No enlarged mediastinal, hilar, or axillary lymph nodes. Thyroid gland, trachea, and esophagus demonstrate no significant findings. Lungs/Pleura: No pneumothorax or pleural effusion is noted. Emphysematous disease is noted in the upper lobes bilaterally. Nodular densities are noted laterally in the right lower lobe most consistent with atypical inflammation or other inflammation. Right middle lobe opacity is noted concerning for atelectasis or possibly inflammation. Upper Abdomen: Probable exophytic cyst seen arising from upper pole of left kidney. Musculoskeletal: No chest wall abnormality. No acute or significant osseous findings. Review of the MIP images confirms the above findings. IMPRESSION: 1. Acute bilateral lower lobe pulmonary emboli are noted. 2. Coronary artery calcifications are noted suggesting coronary artery disease. 3. Emphysematous disease is noted in the upper lobes bilaterally. 4. Nodular densities are noted laterally in the right lower lobe most consistent with atypical inflammation or other inflammation. Right middle lobe opacity is noted concerning for atelectasis or possibly inflammation. Follow-up unenhanced chest CT in 3-4 weeks is recommended to ensure resolution and rule out neoplasm. Aortic Atherosclerosis (ICD10-I70.0) and Emphysema (ICD10-J43.9). Electronically Signed: By: Marijo Conception M.D. On: 02/22/2020 12:26   MR ANGIO HEAD WO CONTRAST  Result Date: 02/14/2020 CLINICAL DATA:  Follow-up cerebral hemorrhage. EXAM: MRI  HEAD WITHOUT CONTRAST MRA HEAD WITHOUT CONTRAST TECHNIQUE: Multiplanar, multiecho pulse sequences of the brain and surrounding structures were obtained without intravenous contrast. Angiographic images of the head were obtained using MRA technique without contrast. COMPARISON:  Head CT February 14, 2020 6 FINDINGS: MRI HEAD FINDINGS Brain: Redemonstrated right intraventricular hemorrhage with extension into the third and fourth ventricle as well as the occipital horn of left lateral ventricle. The ventricular size remains stable. No midline shift. A loculated fluid collection measuring approximately 1 cm with hematocrit level is noted in the left parietal region at midline (series 11, image 23). Extensive confluent foci of T2 hyperintensity are seen within the white matter of the cerebral hemispheres, basal ganglia, thalami and pons, nonspecific. Scattered punctate foci of susceptibility artifact are seen the bilateral cerebral hemispheres predominantly in subcortical location, bilateral thalami, pons and cerebellar hemispheres, likely representing hemosiderin deposit. This appears progressed since prior MRI. Vascular: Normal flow voids. Skull and upper cervical spine: Normal marrow signal. Sinuses/Orbits: Mild mucosal thickening of the right maxillary sinus. The orbits are maintained. Other: None. MRA HEAD FINDINGS The visualized portions of the distal cervical and intracranial internal carotid arteries are widely patent with normal flow related enhancement. The bilateral anterior cerebral arteries and middle cerebral arteries are widely patent with antegrade flow without high-grade flow-limiting stenosis or proximal branch occlusion. No intracranial aneurysm within the anterior circulation. The vertebral arteries are widely patent with antegrade flow. The posterior inferior cerebral arteries are normal. Redemonstrated is a fenestration of the bilateral A1 segments. Vertebrobasilar junction and basilar artery are  widely patent with antegrade flow without evidence of basilar stenosis or aneurysm. Posterior cerebral arteries are normal bilaterally. No intracranial aneurysm within the posterior circulation. IMPRESSION: 1. Stable right intraventricular hemorrhage with extension into the third and fourth ventricle. Stable ventricular size. No evidence of hydrocephalus. 2. Extensive confluent T2 hyperintensity within the white matter of the cerebral hemispheres, basal ganglia, thalami, and pons, nonspecific but may be related to chronic small vessel ischemic changes. 3. Multiple foci of susceptibility artifact throughout the brain parenchyma, progressed from prior MRI. These may be related to microangiopathy versus amyloid angiopathy. Electronically  Signed   By: Pedro Earls M.D.   On: 02/14/2020 14:54   MR BRAIN WO CONTRAST  Result Date: 02/14/2020 CLINICAL DATA:  Follow-up cerebral hemorrhage. EXAM: MRI HEAD WITHOUT CONTRAST MRA HEAD WITHOUT CONTRAST TECHNIQUE: Multiplanar, multiecho pulse sequences of the brain and surrounding structures were obtained without intravenous contrast. Angiographic images of the head were obtained using MRA technique without contrast. COMPARISON:  Head CT February 14, 2020 6 FINDINGS: MRI HEAD FINDINGS Brain: Redemonstrated right intraventricular hemorrhage with extension into the third and fourth ventricle as well as the occipital horn of left lateral ventricle. The ventricular size remains stable. No midline shift. A loculated fluid collection measuring approximately 1 cm with hematocrit level is noted in the left parietal region at midline (series 11, image 23). Extensive confluent foci of T2 hyperintensity are seen within the white matter of the cerebral hemispheres, basal ganglia, thalami and pons, nonspecific. Scattered punctate foci of susceptibility artifact are seen the bilateral cerebral hemispheres predominantly in subcortical location, bilateral thalami, pons and  cerebellar hemispheres, likely representing hemosiderin deposit. This appears progressed since prior MRI. Vascular: Normal flow voids. Skull and upper cervical spine: Normal marrow signal. Sinuses/Orbits: Mild mucosal thickening of the right maxillary sinus. The orbits are maintained. Other: None. MRA HEAD FINDINGS The visualized portions of the distal cervical and intracranial internal carotid arteries are widely patent with normal flow related enhancement. The bilateral anterior cerebral arteries and middle cerebral arteries are widely patent with antegrade flow without high-grade flow-limiting stenosis or proximal branch occlusion. No intracranial aneurysm within the anterior circulation. The vertebral arteries are widely patent with antegrade flow. The posterior inferior cerebral arteries are normal. Redemonstrated is a fenestration of the bilateral A1 segments. Vertebrobasilar junction and basilar artery are widely patent with antegrade flow without evidence of basilar stenosis or aneurysm. Posterior cerebral arteries are normal bilaterally. No intracranial aneurysm within the posterior circulation. IMPRESSION: 1. Stable right intraventricular hemorrhage with extension into the third and fourth ventricle. Stable ventricular size. No evidence of hydrocephalus. 2. Extensive confluent T2 hyperintensity within the white matter of the cerebral hemispheres, basal ganglia, thalami, and pons, nonspecific but may be related to chronic small vessel ischemic changes. 3. Multiple foci of susceptibility artifact throughout the brain parenchyma, progressed from prior MRI. These may be related to microangiopathy versus amyloid angiopathy. Electronically Signed   By: Pedro Earls M.D.   On: 02/14/2020 14:54   IR IVC FILTER PLMT / S&I Burke Keels GUID/MOD SED  Result Date: 02/22/2020 INDICATION: 74 year old with intraventricular hemorrhage, polycythemia vera and bilateral pulmonary emboli. Patient is not a  candidate for anticoagulation due to the intraventricular hemorrhage. Request for IVC filter placement. EXAM: IVC FILTER PLACEMENT; IVC VENOGRAM; ULTRASOUND FOR VASCULAR ACCESS Physician: Stephan Minister. Anselm Pancoast, MD MEDICATIONS: None. ANESTHESIA/SEDATION: Fentanyl 0.5 mcg IV; Versed 50 mg IV Moderate Sedation Time:  24 minutes minutes The patient was continuously monitored during the procedure by the interventional radiology nurse under my direct supervision. CONTRAST:  50 mL Omnipaque 300 FLUOROSCOPY TIME:  Fluoroscopy Time: 4 minutes, 18 seconds, 90 mGy COMPLICATIONS: None immediate. PROCEDURE: Informed consent was obtained for an IVC venogram and filter placement. Ultrasound demonstrated a patent right internal jugular vein. Ultrasound image obtained for documentation. The right side of the neck was prepped and draped in a sterile fashion. Maximal barrier sterile technique was utilized including caps, mask, sterile gowns, sterile gloves, sterile drape, hand hygiene and skin antiseptic. The skin was anesthetized with 1% lidocaine. A 21 gauge needle was  directed into the vein with ultrasound guidance and a micropuncture dilator set was placed. A wire was advanced into the IVC. The filter sheath was advanced over the wire into the IVC. An IVC venogram was performed. Five French catheter was used to cannulate the right renal vein with a Bentson wire. Fluoroscopic images were obtained for documentation. A Bard Denali filter was deployed below the lowest renal vein. A follow-up venogram was performed and the vascular sheath was removed with manual compression. FINDINGS: IVC was patent. Bilateral renal veins were identified. The filter was deployed below the lowest renal vein. Follow-up venogram confirmed placement within the IVC and below the renal veins. IMPRESSION: Successful placement of a retrievable IVC filter. PLAN: This IVC filter is potentially retrievable. The patient will be assessed for filter retrieval by  Interventional Radiology in approximately 8-12 weeks. Further recommendations regarding filter retrieval, continued surveillance or declaration of device permanence, will be made at that time. Electronically Signed   By: Markus Daft M.D.   On: 02/22/2020 17:50   DG CHEST PORT 1 VIEW  Result Date: 02/14/2020 CLINICAL DATA:  Status post PICC placement EXAM: PORTABLE CHEST 1 VIEW COMPARISON:  August 15, 2014 FINDINGS: The heart size and mediastinal contours are within normal limits. Aortic knob calcifications are seen. A right-sided central venous catheter seen with the tip at the superior cavoatrial junction. No pneumothorax. No pleural effusion. No acute osseous abnormality. IMPRESSION: Interval placement of right-sided central venous catheter with the tip at the superior cavoatrial junction. Electronically Signed   By: Prudencio Pair M.D.   On: 02/14/2020 13:09   EEG adult  Result Date: 02/19/2020 Lora Havens, MD     02/19/2020  4:18 PM Patient Name: MCKAY BRANDT MRN: [id number] Epilepsy Attending: Lora Havens Referring Physician/Provider: Dr Antony Contras Date: 02/19/2020 Duration: 25.15 mins Patient history: 74yo F with right IVH. EEG to evaluate for seizure. Level of alertness: lethargic AEDs during EEG study: None Technical aspects: This EEG study was done with scalp electrodes positioned according to the 10-20 International system of electrode placement. Electrical activity was acquired at a sampling rate of '500Hz'  and reviewed with a high frequency filter of '70Hz'  and a low frequency filter of '1Hz' . EEG data were recorded continuously and digitally stored. DESCRIPTION: EEG showed continuous generalized 3-'6Hz'  theta-delta slowing. Triphasic waves, generalized, maximal bifrontal were also noted. Hyperventilation and photic stimulation were not performed. ABNORMALITY - Continuous slow, generalized - Triphasics waves, generalized IMPRESSION: This study is suggestive of moderate diffuse encephalopathy,  non specific to etiology but could be secondary to toxic-metabolic causes. No seizures or epileptiform discharges were seen throughout the recording. Priyanka Barbra Sarks   CT BONE MARROW BIOPSY & ASPIRATION  Result Date: 02/15/2020 CLINICAL DATA:  Polycythemia EXAM: CT GUIDED DEEP ILIAC BONE ASPIRATION AND CORE BIOPSY TECHNIQUE: Patient was placed prone on the CT gantry and limited axial scans through the pelvis were obtained. Appropriate skin entry site was identified. Skin site was marked, prepped with chlorhexidine, draped in usual sterile fashion, and infiltrated locally with 1% lidocaine. Intravenous Fentanyl 18mg and Versed 19mwere administered as conscious sedation during continuous monitoring of the patient's level of consciousness and physiological / cardiorespiratory status by the radiology RN, with a total moderate sedation time of 12 minutes. Under CT fluoroscopic guidance an 11-gauge Cook trocar bone needle was advanced into the right iliac bone just lateral to the sacroiliac joint. Once needle tip position was confirmed, core and aspiration samples were obtained, submitted to pathology  for approval. Post procedure scans show no hematoma or fracture. Patient tolerated procedure well. COMPLICATIONS: COMPLICATIONS none IMPRESSION: 1. Technically successful CT guided right iliac bone core and aspiration biopsy. Electronically Signed   By: Lucrezia Europe M.D.   On: 02/15/2020 15:11   ECHOCARDIOGRAM COMPLETE  Result Date: 02/14/2020    ECHOCARDIOGRAM REPORT   Patient Name:   BRIAHNNA HARRIES Date of Exam: 02/14/2020 Medical Rec #:  [id number]        Height:       62.0 in Accession #:    [id number]       Weight:       155.0 lb Date of Birth:  [birth year]       BSA:          1.715 m Patient Age:    42 years         BP:           121/56 mmHg Patient Gender: F                HR:           90 bpm. Exam Location:  Inpatient Procedure: 2D Echo Indications:    stroke 434.91  History:        Patient has prior history  of Echocardiogram examinations, most                 recent 08/16/2014. Risk Factors:Hypertension and Dyslipidemia.  Sonographer:    Johny Chess Referring Phys: [id number] [place] XU IMPRESSIONS  1. Left ventricular ejection fraction, by estimation, is 65 to 70%. The left ventricle has normal function. The left ventricle has no regional wall motion abnormalities. Left ventricular diastolic parameters were normal.  2. Right ventricular systolic function is normal. The right ventricular size is normal. Tricuspid regurgitation signal is inadequate for assessing PA pressure.  3. The mitral valve is grossly normal. No evidence of mitral valve regurgitation. No evidence of mitral stenosis.  4. The aortic valve is tricuspid. Aortic valve regurgitation is not visualized. Mild aortic valve sclerosis is present, with no evidence of aortic valve stenosis.  5. The inferior vena cava is normal in size with greater than 50% respiratory variability, suggesting right atrial pressure of 3 mmHg. Conclusion(s)/Recommendation(s): No intracardiac source of embolism detected on this transthoracic study. A transesophageal echocardiogram is recommended to exclude cardiac source of embolism if clinically indicated. FINDINGS  Left Ventricle: Left ventricular ejection fraction, by estimation, is 65 to 70%. The left ventricle has normal function. The left ventricle has no regional wall motion abnormalities. The left ventricular internal cavity size was normal in size. There is  no left ventricular hypertrophy. Left ventricular diastolic parameters were normal. Right Ventricle: The right ventricular size is normal. No increase in right ventricular wall thickness. Right ventricular systolic function is normal. Tricuspid regurgitation signal is inadequate for assessing PA pressure. Left Atrium: Left atrial size was normal in size. Right Atrium: Right atrial size was normal in size. Pericardium: There is no evidence of pericardial effusion.  Presence of pericardial fat pad. Mitral Valve: The mitral valve is grossly normal. Mild mitral annular calcification. No evidence of mitral valve regurgitation. No evidence of mitral valve stenosis. Tricuspid Valve: The tricuspid valve is grossly normal. Tricuspid valve regurgitation is not demonstrated. No evidence of tricuspid stenosis. Aortic Valve: The aortic valve is tricuspid. Aortic valve regurgitation is not visualized. Mild aortic valve sclerosis is present, with no evidence of aortic valve stenosis. Pulmonic Valve: The pulmonic valve was grossly normal.  Pulmonic valve regurgitation is not visualized. No evidence of pulmonic stenosis. Aorta: The aortic root is normal in size and structure. Venous: The inferior vena cava is normal in size with greater than 50% respiratory variability, suggesting right atrial pressure of 3 mmHg. IAS/Shunts: The atrial septum is grossly normal.  LEFT VENTRICLE PLAX 2D LVIDd:         4.40 cm  Diastology LVIDs:         2.70 cm  LV e' lateral:   9.46 cm/s LV PW:         0.90 cm  LV E/e' lateral: 8.6 LV IVS:        0.70 cm  LV e' medial:    8.05 cm/s LVOT diam:     2.00 cm  LV E/e' medial:  10.1 LV SV:         74 LV SV Index:   43 LVOT Area:     3.14 cm  RIGHT VENTRICLE RV S prime:     17.10 cm/s TAPSE (M-mode): 2.5 cm LEFT ATRIUM             Index       RIGHT ATRIUM           Index LA diam:        3.40 cm 1.98 cm/m  RA Area:     11.40 cm LA Vol (A2C):   26.9 ml 15.68 ml/m RA Volume:   23.90 ml  13.93 ml/m LA Vol (A4C):   38.5 ml 22.44 ml/m LA Biplane Vol: 33.5 ml 19.53 ml/m  AORTIC VALVE LVOT Vmax:   111.00 cm/s LVOT Vmean:  70.600 cm/s LVOT VTI:    0.234 m MITRAL VALVE MV Area (PHT): 3.12 cm    SHUNTS MV Decel Time: 243 msec    Systemic VTI:  0.23 m MV E velocity: 81.00 cm/s  Systemic Diam: 2.00 cm MV A velocity: 99.80 cm/s MV E/A ratio:  0.81 Eleonore Chiquito MD Electronically signed by Eleonore Chiquito MD Signature Date/Time: 02/14/2020/4:33:15 PM    Final    CT VENOGRAM  HEAD  Result Date: 02/24/2020 CLINICAL DATA:  Stroke, follow-up. EXAM: CT VENOGRAM HEAD TECHNIQUE: Postcontrast CT venography of the head was performed. Coronal and sagittal thin reconstructions were also submitted for evaluation. Additionally, axial, coronal and sagittal thick MIP reconstructions were submitted for evaluation. CONTRAST:  38m OMNIPAQUE IOHEXOL 300 MG/ML  SOLN COMPARISON:  Noncontrast head CT performed earlier the same day 02/24/2020. FINDINGS: There is confirmed extensive thrombus within the confluence of sinuses and straight sinus also extending into the vein of Galen. There is also fairly extensive multifocal thrombus within the right greater than left transverse and sigmoid dural venous sinuses. Some enhancement is seen within the internal cerebral veins and proximal basal veins of Rosenthal. No definite thrombus is identified within the superior or inferior sagittal sinuses. Enhancement is seen within the visualized upper jugular veins bilaterally. IMPRESSION: Confirmed extensive thrombus within the confluence of sinuses and straight sinus, also extending into the vein of Galen. There is also fairly extensive multifocal thrombus within the right greater than left transverse and sigmoid dural venous sinuses. Electronically Signed   By: KKellie SimmeringDO   On: 02/24/2020 16:37   VAS [place]OWER EXTREMITY VENOUS (DVT)  Result Date: 02/23/2020  Lower Venous DVTStudy Indications: Pulmonary embolism.  Limitations: Patient positioning. Comparison Study: no prior Performing Technologist: MAbram SanderRVS  Examination Guidelines: A complete evaluation includes B-mode imaging, spectral Doppler, color Doppler, and power Doppler as  needed of all accessible portions of each vessel. Bilateral testing is considered an integral part of a complete examination. Limited examinations for reoccurring indications may be performed as noted. The reflux portion of the exam is performed with the patient in reverse  Trendelenburg.  +---------+---------------+---------+-----------+----------+--------------+ RIGHT    CompressibilityPhasicitySpontaneityPropertiesThrombus Aging +---------+---------------+---------+-----------+----------+--------------+ CFV      Full           Yes      Yes                                 +---------+---------------+---------+-----------+----------+--------------+ SFJ      Full                                                        +---------+---------------+---------+-----------+----------+--------------+ FV Prox  Full                                                        +---------+---------------+---------+-----------+----------+--------------+ FV Mid   Full                                                        +---------+---------------+---------+-----------+----------+--------------+ FV DistalFull                                                        +---------+---------------+---------+-----------+----------+--------------+ PFV      Full                                                        +---------+---------------+---------+-----------+----------+--------------+ POP      Full           Yes      Yes                                 +---------+---------------+---------+-----------+----------+--------------+ PTV      Full                                                        +---------+---------------+---------+-----------+----------+--------------+ PERO     Full                                                        +---------+---------------+---------+-----------+----------+--------------+   +---------+---------------+---------+-----------+----------+--------------+  LEFT     CompressibilityPhasicitySpontaneityPropertiesThrombus Aging +---------+---------------+---------+-----------+----------+--------------+ CFV      Full           Yes      Yes                                  +---------+---------------+---------+-----------+----------+--------------+ SFJ      Full                                                        +---------+---------------+---------+-----------+----------+--------------+ FV Prox  Full                                                        +---------+---------------+---------+-----------+----------+--------------+ FV Mid   Full                                                        +---------+---------------+---------+-----------+----------+--------------+ FV DistalFull                                                        +---------+---------------+---------+-----------+----------+--------------+ PFV      Full                                                        +---------+---------------+---------+-----------+----------+--------------+ POP      Full           Yes      Yes                                 +---------+---------------+---------+-----------+----------+--------------+ PTV      Full                                                        +---------+---------------+---------+-----------+----------+--------------+ PERO     Full                                                        +---------+---------------+---------+-----------+----------+--------------+     Summary: BILATERAL: - No evidence of deep vein thrombosis seen in the lower extremities, bilaterally.   *See table(s) above for measurements and observations. Electronically signed by Harold Barban MD on 02/23/2020 at 5:39:17 PM.    Final    [place]  EKG SITE RITE  Result Date: 02/14/2020 If Site Rite image not attached, placement could not be confirmed due to current cardiac rhythm.   ELIGIBLE FOR AVAILABLE RESEARCH PROTOCOL: no  ASSESSMENT: 74 y.o. [place] woman presenting 02/13/2020 with headache, found to have a right lateral ventricle bleed, in the setting of panmyelosis, most c/w polycythemia vera.   (1) leukapheresis x3 . Last  leukapheresis Friday 02/18/2020  (a) HD catheter removed after pheresis on  02/18/2020  (2) bone marrow biopsy 02/13/2020 shows no evidence of leukemia, c/w P Vera; additional confirmatory labs pending  (a) JAK2 mutation positive confirming diagnosis  (3) rule out acquired von Willebrand disease: borderling low ristocetin cofactor; does not meet criteria for vWD; will repeat once patient in remission  (4) intraventricular bleed: amyloid angiopathy? Other?  (a) outpatient neurologic follow-up?  (5) bilateral pulmonary emboli  (a) status post IVC filter placement on 02/22/2020  (6) atrial fibrillation with RVR  PLAN: Julita remains in the ICU.  CBC from today is overall stable.  Platelet count remains significantly low at 23,000 but she is not actively bleeding.  The patient's polycythemia vera is currently well controlled with a hematocrit well under 45%.  Her hydroxyurea has been discontinued secondary to severe thrombocytopenia.  I would not expect a significant drop in her platelet count like this from the hydroxyurea.  HIT still remains a concern.  Her HIT antibody is pending.  I contacted the lab who cannot expedite this result for [place].  I anticipate it may result over the weekend.  Continue to avoid all heparin.  Recommend platelet transfusion for platelet count of less than 10,000 or active bleeding.  We will continue to follow with you.  [name], NP   02/25/2020 12:59 PM

## 2020-02-25 NOTE — Progress Notes (Signed)
COURTESY NOTE: Events of past 48 hrs reviewed. The patient's [name] is currently well controlled with HCT well under 45%. The patient has been on hydrea, however that does not explain the extreme drop in her platelets-- note the WBC are still >10K. HIT is a strong concern and unfortunately we are not likely to get results for the HIT screen until the weekend. In the meantime I would avoid all heparin.   HIT is a prothrombotic condition and we generally recommend against platelet transfusion except for bleeding or platelet count <10K. In cases like this one where there is both bleeding and clotting, the risks of aggravating the pro-thrombotic tendency have to be balanced against risk of further bleeding on a transfusion-by-transfusion basis and there is no general rule.  Will follow with you

## 2020-02-25 NOTE — Progress Notes (Signed)
STROKE TEAM PROGRESS NOTE   INTERVAL HISTORY Patient  . Sitting up. Complains of persistent headache.no SOB and oxygen sats are good on room air.Platelets are yet low at 23,000.  She had emergent thrombectomy of occluded deep venous system with decreased clot burden in the torcula, right transverse and sigmoid sinuses.  MRI scan of the brain was obtained today which shows numerous tiny acute infarcts in bilateral cerebral and cerebellar hemispheres with a 2.3 cm cortical-based right temporal and occipital venous infarct.  Small subacute infarcts in splenium of corpus callosum.  Left temporal cortical edema.  Subacute intraventricular hemorrhage in the atrium of the right ventricle appears unchanged.  MR venogram still shows nonvisualization of the straight sinus and vein of Galen which are likely occluded.  Nonvisualization of the right internal cerebral vein and right basal vein of Rosenthal.  Extensive multifocal subocclusive thrombus in the right transverse and sigmoid sinuses.  Left transverse sinus and sigmoid sinus appear to be occluded and were not seen  Vitals:   02/25/20 1530 02/25/20 1558 02/25/20 1600 02/25/20 1630  BP: 121/65  132/71 131/65  Pulse: 81  77 80  Resp:      Temp:  97.6 F (36.4 C)    TempSrc:      SpO2: 97%  97% 96%  Weight:      Height:       CBC:  Recent Labs  Lab 02/22/20 0346 02/22/20 0346 02/24/20 1144 02/24/20 1144 02/24/20 1450 02/25/20 0218  WBC 18.4*   < > 10.2  --   --  10.6*  NEUTROABS 16.2*  --   --   --   --  9.1*  HGB 13.4   < > 14.1  --   --  11.6*  HCT 40.4   < > 41.4  --   --  35.0*  MCV 90.6   < > 87.9  --   --  90.7  PLT 160   < > 24*   < > 25*  26* 23*   < > = values in this interval not displayed.   Basic Metabolic Panel:  Recent Labs  Lab 02/24/20 0920 02/25/20 0218  NA 140 139  K 2.3* 3.0*  CL 106 110  CO2 21* 18*  GLUCOSE 143* 140*  BUN 6* 8  CREATININE 0.73 0.80  CALCIUM 8.5* 8.3*  MG 1.8 2.3   Lipid Panel:      Component Value Date/Time   CHOL 172 02/14/2020 0959   TRIG 46 02/14/2020 0959   HDL 71 02/14/2020 0959   CHOLHDL 2.4 02/14/2020 0959   VLDL 9 02/14/2020 0959   LDLCALC 92 02/14/2020 0959   HgbA1c:  Lab Results  Component Value Date   HGBA1C 6.1 (H) 02/14/2020    IMAGING past 24 hours MR BRAIN WO CONTRAST  Result Date: 02/25/2020 CLINICAL DATA:  Stroke, follow-up. Additional history obtained from [place] post venous thrombectomy yesterday. EXAM: MRI HEAD WITHOUT CONTRAST TECHNIQUE: Multiplanar, multiecho pulse sequences of the brain and surrounding structures were obtained without intravenous contrast. COMPARISON:  CT venogram 02/24/2020, head CT 02/24/2020, MRI/MRA head 02/14/2019 FINDINGS: Brain: New from prior MRI 02/14/2020 there are numerous scattered tiny acute infarcts within the bilateral cerebral and cerebellar hemispheres. Additionally, there is an acute cortically based infarct within the left temporal occipital lobes which measures 2.3 x 1.3 cm in transaxial dimensions (series 5, image 66). Small foci of diffusion weighted hyperintensity within the callosal splenium with corresponding intermediate signal on the ADC  map, likely reflecting small subacute infarcts. Subacute intraventricular hemorrhage predominantly within the atrium of the right lateral ventricle has not significantly changed in extent. No hydrocephalus. Also new from prior MRI there is parenchymal swelling and cortical/subcortical edema within the left temporal lobe and frontal operculum measuring 5.5 x 1.9 cm (AP x CC). This may be secondary to venous hypertension. No evidence of acute infarct at this site on the current exam. Redemonstrated extensive confluent T2/FLAIR hyperintensity within the cerebral white matter and patchy T2 hyperintense signal changes within the basal ganglia, thalami and pons. Findings are nonspecific but may reflect chronic small vessel ischemic disease. Numerous  chronic microhemorrhages again present within the cerebral and cerebellar hemispheres and brainstem. Vascular: Flow voids maintained within the proximal large arterial vessels. Please refer to MR venography separately reported. Skull and upper cervical spine: No focal marrow lesion. Sinuses/Orbits: Visualized orbits demonstrate no acute abnormality. Right maxillary sinus mucous retention cyst. Trace ethmoid sinus mucosal thickening. No significant mastoid effusion. IMPRESSION: 1. Numerous tiny acute infarcts within the bilateral cerebral and cerebellar hemispheres. Findings are suspicious for an embolic process. 2. 2.3 cm acute cortically based infarct within the right temporal occipital lobes likely reflecting a venous infarct. 3. Small subacute infarcts within the callosal splenium. 4. New region of parenchymal swelling and T2 hyperintensity within the left temporal lobe and frontal operculum which may reflect edema related to venous hypertension. No evidence of acute infarct at this site on the current exam. 5. Unchanged subacute intraventricular hemorrhage predominantly within the atrium of the right lateral ventricle. No hydrocephalus. 6. Redemonstrated extensive T2 hyperintense signal changes within the cerebral white matter, basal ganglia, thalami and pons which is nonspecific, but may reflect sequela of chronic small vessel ischemia. 7. Redemonstrated numerous supratentorial and infratentorial chronic microhemorrhages which may be related to hypertensive microangiopathy or amyloid angiopathy. Electronically Signed   By: Kellie Simmering DO   On: 02/25/2020 13:55   MR MRV HEAD WO CM  Result Date: 02/25/2020 CLINICAL DATA:  Stroke, follow-up. Additional history obtained from [place] thrombectomy yesterday. EXAM: MR VENOGRAM OF THE HEAD WITHOUT CONTRAST TECHNIQUE: Angiographic images of the intracranial venous structures were obtained using MRV technique without intravenous contrast.  COMPARISON:  Concurrently performed brain MRI, CT venogram 02/24/2020, MRI/MRA head 02/14/2020 FINDINGS: There is nonvisualization of the straight sinus and vein of Galen likely reflecting complete occlusion. There is flow related signal within the left internal cerebral vein. The right internal cerebral vein is nonvisualized and may be thrombosed. Nonvisualization of the right basal vein of Rosenthal with prominent SWI signal loss at this site on concurrently performed brain MRI, likely thrombosed. As compared to CTV performed yesterday, there is improved patency of the confluence of sinuses. The superior sagittal sinus is patent without filling defect. There is persistent fairly extensive subocclusive thrombus within the right transverse and sigmoid dural venous sinuses. Poor delineation of the left transverse sinus and portions of the left sigmoid sinus likely reflecting persistent thrombus within these vessels. IMPRESSION: 1. Nonvisualization of the straight sinus and vein of Galen likely reflecting occlusive thrombosis. 2. Nonvisualization of the right internal cerebral vein and right basal vein of Rosenthal, likely occluded. 3. Persistent and fairly extensive multifocal subocclusive thrombus within the right transverse and sigmoid sinuses. 4. Poor delineation of the left transverse sinus and of portions of the left sigmoid sinus consistent likely reflecting persistent thrombus within these vessels. 5. Improved patency of the confluence of sinuses 6. Patent superior sagittal sinus. Electronically Signed  By: Kellie Simmering DO   On: 02/25/2020 14:08     PHYSICAL EXAM      General - Well nourished, well developed, no acute distress Ophthalmologic - fundi not visualized due to noncooperation. Respiratory system tachypneic but oxygen sats are maintained.  Lungs are clear to auscultation.  No wheezing or rhonchi Cardiovascular - Regular rhythm and rate.  Mental Status -  She is awake and alert and follows  commands appropriately. Speech is slightly hesitant.  Diminished attention, registration and recall..  Mildly impaired expression, naming, repetition, comprehension Cranial Nerves II - XII - II - Visual field intact OU. III, IV, VI - Extraocular movements intact. V - Facial sensation intact bilaterally. VII - mild left facial droop. VIII - Hearing & vestibular intact bilaterally. X - Palate elevates symmetrically. XI - Chin turning & shoulder shrug intact bilaterally. XII - Tongue protrusion intact.  Motor Strength - The patient's strength was normal in all extremities and pronator drift was absent.  Bulk was normal and fasciculations were absent.   Motor Tone - Muscle tone was assessed at the neck and appendages and was normal.  Reflexes - The patient's reflexes were symmetrical in all extremities and she had no pathological reflexes.  Sensory - Light touch, temperature/pinprick were assessed and were symmetrical.    Coordination - The patient had normal movements in the hands with no ataxia or dysmetria.  Tremor was absent.  Gait and Station - deferred.   ASSESSMENT/PLAN Ms. DEMETRIS SWISSHELM is a 74 y.o. female with history of HTN presenting with severe persistent HA accompanied by nausea and vomiting. This has now improved.   IVH - right lateral ventricle IVH secondary to deep cerebral venous sinus thrombosis secondary to hyperviscosity from polycythemia  S/p mechanical thrombectomy 02/24/20 of occluded deep cerebral veins   repeat MRI imaging 02/25/2020 shows multiple by cerebral and cerebellar infarcts as well as right temporal cortical venous infarct and cytotoxic edema   CT head 4/5 0032 R lateral IVH. Severe chronic ischemic microangiopathy.  CT head 4/5 0857 no sign change  MRI Stable IVH, severe leukoaraiosis. Multiple foci throughout progressed from previous MRI d/t microangiopathy vs amyloid.  MRA unremarkable   Repeat CT head 4/7 stable IVH and ventricle  size  Repeat CT head 4/9 - Unchanged size and configuration of right lateral intraventricular hemorrhage since 02/16/2020. Small volume of intraventricular hemorrhage elsewhere has regressed. No ventriculomegaly or transependymal edema. No new intracranial abnormality. Advanced white matter disease, and numerous chronic microhemorrhages throughout the brain demonstrated on recent MRI.  CTA Head - 02/19/2020 - Suboptimal evaluation due to artifact. Patent anterior and posterior circulations. No abnormal vascularity identified.  EEG - This study is suggestive of moderate diffuse encephalopathy, non specific to etiology but could be secondary to toxic-metabolic causes. No seizures or epileptiform discharges were seen throughout the recording.   2D Echo EF 65-70%. No source of embolus   LDL 92   UA - unremarkable except ketones 20 (not diabetic)  HgbA1c 6.1   SCDs for VTE prophylaxis  aspirin 81 mg daily prior to admission, now on No antithrombotic given IVH and likely CAA  Therapy recommendations:  HH PT, HH OT, [place] SLP (arranged)  Disposition:  pending  (held d/t new PE and now Afib RVR)  Headache, due to IVH  Depakote 500 mg nightly discontinued  Pt now has Fioricet one Q8 hrs prn as well as tylenol prn  Solumedrol 500 mg IV daily for 3 days started Saturday  Dilaudid -  one time dose 02/19/20 per Dr Leonie Man  NS at 100 cc's / hr started 4/10  Therapeutic phlebotomy performed 4/10  Add topamax 25 bid  Start prednisone taper 40-30-20-10-5-5 on 4/13>>  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 Ron Agee   IV fluids NS at 100 cc's per hr.  Dr Leonie Man discussed with Dr Alen Blew on Saturday -> 1 unit of phlebotomy 4/10  Bilateral PE  New SOB and tachycardia  CTA chest - BLE PE. Coronary calcification. BUL emphysema. R sided nodules - RLL inplammation. RLM inflammation vs atx. - need f/u CT in 3-4 wks to ensure resolution and r/o neoplasm.  Unable to get [facility] d/t IVH  Filter placement on 4/13  Afib RVR in 160's - Diltizem gtt started - Unable to anticoagulate d/t ICH  Hypertension  Home meds:  losartan 100, amlodipine 5  On losartan 50 bid and amlodipine 10  Add hydralazine 25 tid . SBP goal < 140  Off Cleviprex  BP Stable  . 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 Multiple foci throughout progressed from previous MRI d/t microangiopathy vs amyloid.  As per husband, pt has some anomia as baseline  BP goal < 140  Avoid antiplatelet or anticoagulation  Hyperlipidemia  Home meds:  lipitor 10  LDL 92  Statin held in setting of acute ICH  Consider continuation of statin at discharge  Hypokalemia and Hypomagnesemia   Potassium 3.3->3.5->2.7->3.8->3.4->3.3 supplement and recheck in AM  Magnesium - 1.6 supplement - 2.2  Magnesium may help HA pain Thrombocytopenia likely secondary to HIT related to heparin Other Stroke Risk Factors  Advanced age  Former Cigarette smoker  Overweight, Body mass index is 28.19 kg/m., recommend weight loss, diet and exercise as appropriate   Family hx stroke (mother, father, and multiple other family members)  Other Active Problems  Hepatitis C  Hypothyroid on synthroid. TSH WNL - resume synthroid   Hospital day # 11 Patient neurologically does not appear to be significantly worse but  MRI scan shows increasing cortical infarcts, cortical edema and stable intracerebral hemorrhage likely from persistent deep cerebral venous sinus thrombosis despite mechanical thrombectomy.  She is at significant risk for worsening of thrombosis and increasing venous infarcts and intracranial hemorrhages however she is a high risk for bleeding due to her thrombocytopenia Case discussed at length with Dr. Jana Hakim hematologist and recommend starting her on argatroban IV drip till the results of HIT panel come back and platelet count increases significantly at least above 50,000 Long discussion with patient's husband as well as son at the bedside and answered questions and reviewed imaging studies This patient is critically ill and at significant risk of neurological worsening, death and care requires constant monitoring of vital signs, hemodynamics,respiratory and cardiac monitoring, extensive review of multiple databases, frequent neurological assessment, discussion with family, other specialists and medical decision making of high complexity.I have made any additions or clarifications directly to the above note.This critical care time does not reflect procedure time, or teaching time  or supervisory time of PA/NP/Med Resident etc but could involve care discussion time.  I spent 60 minutes of neurocritical care time  in the care of  this patient.     Antony Contras, MD Medical Director [facility] Pager: [phone] 02/25/2020 4:51 PM   To contact Stroke Continuity provider, please refer to [email or web address] After hours, contact General Neurology

## 2020-02-25 NOTE — Progress Notes (Addendum)
Referring Physician(s): Garvin Fila  Supervising Physician: Pedro Earls  Patient Status:  [facility] - In-pt  Chief Complaint: "Headache"  Subjective:  Cerebral venous thrombosis s/p cerebral angiogram with emergent mechanical thrombectomy of right transverse sinus, sigmoid sinus, torcula, straight sinus, and vein of Galen with recanalization of the deep system and decreased clot burden in the torcula, right transverse, and sigmoid sinus 02/24/2020 by Dr. Karenann Cai. Patient awake and alert sitting in bed. Accompanied by husband and son at bedside. Complains of headache, stable from yesterday. Can spontaneously move all extremities. Spouse complaining of difficulty with word finding with patient- however, patient able to correctly identity items presented to her today with occasional delayed response. Right radial incision (arterial) and right cephalic incision (venous) c/d/i.   Allergies: Erythromycin  Medications: Prior to Admission medications   Medication Sig Start Date End Date Taking? Authorizing Provider  ALPRAZolam Duanne Moron) 0.5 MG tablet Take 0.25 mg by mouth daily as needed for anxiety.    Yes [provider]  amLODipine (NORVASC) 5 MG tablet Take 5 mg by mouth daily. 01/26/20  Yes [provider]  aspirin 81 MG tablet Take 1 tablet (81 mg total) by mouth daily. 08/17/14  Yes Rai, Ripudeep K, MD  atorvastatin (LIPITOR) 10 MG tablet Take 10 mg by mouth every evening.    Yes [provider]  Cholecalciferol (VITAMIN D3) 2000 UNITS TABS Take 2,000 Units by mouth every evening.   Yes [provider]  levothyroxine (SYNTHROID, LEVOTHROID) 125 MCG tablet Take 125 mcg by mouth daily before breakfast.   Yes [provider]  losartan (COZAAR) 100 MG tablet Take 100 mg by mouth every evening.   Yes [provider]  Probiotic Product (ALIGN PO) Take 1 capsule by mouth daily.   Yes [provider]    SYMBICORT 160-4.5 MCG/ACT inhaler Inhale 1 puff into the lungs daily as needed (shortness of breath).  12/03/19  Yes [provider]  TRELEGY ELLIPTA 100-62.5-25 MCG/INH AEPB Inhale 1 puff into the lungs daily as needed (shortness of breath).  02/03/20  Yes [provider]  allopurinol (ZYLOPRIM) 300 MG tablet Take 1 tablet (300 mg total) by mouth daily. 02/18/20   Magrinat, Virgie Dad, MD  hydroxyurea (HYDREA) 500 MG capsule Take 1 capsule (500 mg total) by mouth daily. May take with food to minimize GI side effects. 02/18/20   Magrinat, Virgie Dad, MD     Vital Signs: BP (!) 143/78   Pulse 78   Temp 97.7 F (36.5 C)   Resp (!) 23   Ht 5\' 2"  (1.575 m)   Wt 154 lb 1.6 oz (69.9 kg)   SpO2 97%   BMI 28.19 kg/m   Physical Exam Vitals and nursing note reviewed.  Constitutional:      General: She is not in acute distress. Pulmonary:     Effort: Pulmonary effort is normal. No respiratory distress.  Skin:    General: Skin is warm and dry.     Comments: Right radial incision (arterial) soft without active bleeding or hematoma; Right cephalic incision (venous) with surrounding ecchymosis, no active bleeding or hematoma.  Neurological:     Mental Status: She is alert.     Comments: Alert and awake but occasionally slow to respond, oriented to person and place but not time. She follows simple commands. Speech occasionally delayed but able to correctly identify pen, name card, cup, and washcloth. PERRL bilaterally. Can spontaneously move all extremities. No  pronator drift. Radial pulses 2+ bilaterally.     Imaging: DG Chest 2 View  Result Date: 02/22/2020 CLINICAL DATA:  Shortness of breath EXAM: CHEST - 2 VIEW COMPARISON:  February 14, 2020 FINDINGS: There is atelectatic change in the right base. Lungs elsewhere are clear. Heart size and pulmonary vascularity are normal. No adenopathy. No bone lesions. IMPRESSION: Right base atelectasis. Lungs elsewhere clear. Cardiac silhouette  within normal limits. Electronically Signed   By: Lowella Grip III M.D.   On: 02/22/2020 09:20   CT Head Wo Contrast  Addendum Date: 02/24/2020   ADDENDUM REPORT: 02/24/2020 15:17 ADDENDUM: These results were called by telephone at the time of interpretation on 02/24/2020 at 2:40 pm to provider PRAMOD SETHI , who verbally acknowledged these results. Electronically Signed   By: Kellie Simmering DO   On: 02/24/2020 15:17   Result Date: 02/24/2020 CLINICAL DATA:  Stroke, follow-up. EXAM: CT HEAD WITHOUT CONTRAST TECHNIQUE: Contiguous axial images were obtained from the base of the skull through the vertex without intravenous contrast. COMPARISON:  Head CT 02/19/2020, brain MRI 02/14/2020 FINDINGS: Brain: Interval decrease in conspicuity of intraventricular hemorrhage again most notably within the atrium and temporal horn of the right lateral ventricle. Redemonstrated small volume acute hemorrhage layering within the occipital horns. No ventriculomegaly. No midline shift. Redemonstrated advanced cerebral white matter hypodensity. No demarcated cortical infarct. Cerebral volume is normal for age. Vascular: Atherosclerotic arterial calcifications. New as compared to prior examination 02/19/2020 there is increased density of the straight sinus, vein of Galen and inferior sagittal sinus. Hyperdensity is also questioned within the lateral left transverse sinus. Skull: Normal. Negative for fracture or focal lesion. Sinuses/Orbits: Frothy secretions layering within the right maxillary sinus. No significant mastoid effusion. IMPRESSION: Increased density of the straight sinus, vein of Galen and inferior sagittal sinus. Hyperdensity is also questioned within portions of the lateral left transverse sinus. Findings are suspicious for dural venous sinus thrombosis. Consider CT venography for further evaluation. Interval decrease in conspicuity of intraventricular hemorrhage again most notable within the atrium and temporal  horn of the right lateral ventricle. No ventriculomegaly. Redemonstrated severe cerebral white matter disease. Electronically Signed: By: Kellie Simmering DO On: 02/24/2020 14:34   CT ANGIO CHEST PE W OR WO CONTRAST  Addendum Date: 02/22/2020   ADDENDUM REPORT: 02/22/2020 12:39 ADDENDUM: Critical Value/emergent results were called by telephone at the time of interpretation on 02/22/2020 at 12:32 pm to provider Dr. Leonie Man, who verbally acknowledged these results. Electronically Signed   By: Marijo Conception M.D.   On: 02/22/2020 12:39   Result Date: 02/22/2020 CLINICAL DATA:  Shortness of breath. EXAM: CT ANGIOGRAPHY CHEST WITH CONTRAST TECHNIQUE: Multidetector CT imaging of the chest was performed using the standard protocol during bolus administration of intravenous contrast. Multiplanar CT image reconstructions and MIPs were obtained to evaluate the vascular anatomy. CONTRAST:  147mL OMNIPAQUE IOHEXOL 350 MG/ML SOLN COMPARISON:  None. FINDINGS: Cardiovascular: Filling defects are noted in the lower lobe branches of both pulmonary arteries consistent with acute pulmonary emboli. Normal cardiac size. No pericardial effusion. Coronary artery calcifications are noted. Atherosclerosis of thoracic aorta is noted without aneurysm formation. Mediastinum/Nodes: No enlarged mediastinal, hilar, or axillary lymph nodes. Thyroid gland, trachea, and esophagus demonstrate no significant findings. Lungs/Pleura: No pneumothorax or pleural effusion is noted. Emphysematous disease is noted in the upper lobes bilaterally. Nodular densities are noted laterally in the right lower lobe most consistent with atypical inflammation or other inflammation. Right middle lobe opacity is noted concerning for  atelectasis or possibly inflammation. Upper Abdomen: Probable exophytic cyst seen arising from upper pole of left kidney. Musculoskeletal: No chest wall abnormality. No acute or significant osseous findings. Review of the MIP images confirms  the above findings. IMPRESSION: 1. Acute bilateral lower lobe pulmonary emboli are noted. 2. Coronary artery calcifications are noted suggesting coronary artery disease. 3. Emphysematous disease is noted in the upper lobes bilaterally. 4. Nodular densities are noted laterally in the right lower lobe most consistent with atypical inflammation or other inflammation. Right middle lobe opacity is noted concerning for atelectasis or possibly inflammation. Follow-up unenhanced chest CT in 3-4 weeks is recommended to ensure resolution and rule out neoplasm. Aortic Atherosclerosis (ICD10-I70.0) and Emphysema (ICD10-J43.9). Electronically Signed: By: Marijo Conception M.D. On: 02/22/2020 12:26   IR IVC FILTER PLMT / S&I Burke Keels GUID/MOD SED  Result Date: 02/22/2020 INDICATION: 74 year old with intraventricular hemorrhage, polycythemia vera and bilateral pulmonary emboli. Patient is not a candidate for anticoagulation due to the intraventricular hemorrhage. Request for IVC filter placement. EXAM: IVC FILTER PLACEMENT; IVC VENOGRAM; ULTRASOUND FOR VASCULAR ACCESS Physician: Stephan Minister. Anselm Pancoast, MD MEDICATIONS: None. ANESTHESIA/SEDATION: Fentanyl 0.5 mcg IV; Versed 50 mg IV Moderate Sedation Time:  24 minutes minutes The patient was continuously monitored during the procedure by the interventional radiology nurse under my direct supervision. CONTRAST:  50 mL Omnipaque 300 FLUOROSCOPY TIME:  Fluoroscopy Time: 4 minutes, 18 seconds, 90 mGy COMPLICATIONS: None immediate. PROCEDURE: Informed consent was obtained for an IVC venogram and filter placement. Ultrasound demonstrated a patent right internal jugular vein. Ultrasound image obtained for documentation. The right side of the neck was prepped and draped in a sterile fashion. Maximal barrier sterile technique was utilized including caps, mask, sterile gowns, sterile gloves, sterile drape, hand hygiene and skin antiseptic. The skin was anesthetized with 1% lidocaine. A 21 gauge needle  was directed into the vein with ultrasound guidance and a micropuncture dilator set was placed. A wire was advanced into the IVC. The filter sheath was advanced over the wire into the IVC. An IVC venogram was performed. Five French catheter was used to cannulate the right renal vein with a Bentson wire. Fluoroscopic images were obtained for documentation. A Bard Denali filter was deployed below the lowest renal vein. A follow-up venogram was performed and the vascular sheath was removed with manual compression. FINDINGS: IVC was patent. Bilateral renal veins were identified. The filter was deployed below the lowest renal vein. Follow-up venogram confirmed placement within the IVC and below the renal veins. IMPRESSION: Successful placement of a retrievable IVC filter. PLAN: This IVC filter is potentially retrievable. The patient will be assessed for filter retrieval by Interventional Radiology in approximately 8-12 weeks. Further recommendations regarding filter retrieval, continued surveillance or declaration of device permanence, will be made at that time. Electronically Signed   By: Markus Daft M.D.   On: 02/22/2020 17:50   CT VENOGRAM HEAD  Result Date: 02/24/2020 CLINICAL DATA:  Stroke, follow-up. EXAM: CT VENOGRAM HEAD TECHNIQUE: Postcontrast CT venography of the head was performed. Coronal and sagittal thin reconstructions were also submitted for evaluation. Additionally, axial, coronal and sagittal thick MIP reconstructions were submitted for evaluation. CONTRAST:  65mL OMNIPAQUE IOHEXOL 300 MG/ML  SOLN COMPARISON:  Noncontrast head CT performed earlier the same day 02/24/2020. FINDINGS: There is confirmed extensive thrombus within the confluence of sinuses and straight sinus also extending into the vein of Galen. There is also fairly extensive multifocal thrombus within the right greater than left transverse and sigmoid dural venous  sinuses. Some enhancement is seen within the internal cerebral veins and  proximal basal veins of Rosenthal. No definite thrombus is identified within the superior or inferior sagittal sinuses. Enhancement is seen within the visualized upper jugular veins bilaterally. IMPRESSION: Confirmed extensive thrombus within the confluence of sinuses and straight sinus, also extending into the vein of Galen. There is also fairly extensive multifocal thrombus within the right greater than left transverse and sigmoid dural venous sinuses. Electronically Signed   By: Kellie Simmering DO   On: 02/24/2020 16:37   VAS [place] LOWER EXTREMITY VENOUS (DVT)  Result Date: 02/23/2020  Lower Venous DVTStudy Indications: Pulmonary embolism.  Limitations: Patient positioning. Comparison Study: no prior Performing Technologist: [name] RVS  Examination Guidelines: A complete evaluation includes B-mode imaging, spectral Doppler, color Doppler, and power Doppler as needed of all accessible portions of each vessel. Bilateral testing is considered an integral part of a complete examination. Limited examinations for reoccurring indications may be performed as noted. The reflux portion of the exam is performed with the patient in reverse Trendelenburg.  +---------+---------------+---------+-----------+----------+--------------+ RIGHT    CompressibilityPhasicitySpontaneityPropertiesThrombus Aging +---------+---------------+---------+-----------+----------+--------------+ CFV      Full           Yes      Yes                                 +---------+---------------+---------+-----------+----------+--------------+ SFJ      Full                                                        +---------+---------------+---------+-----------+----------+--------------+ FV Prox  Full                                                        +---------+---------------+---------+-----------+----------+--------------+ FV Mid   Full                                                         +---------+---------------+---------+-----------+----------+--------------+ FV DistalFull                                                        +---------+---------------+---------+-----------+----------+--------------+ PFV      Full                                                        +---------+---------------+---------+-----------+----------+--------------+ POP      Full           Yes      Yes                                 +---------+---------------+---------+-----------+----------+--------------+  PTV      Full                                                        +---------+---------------+---------+-----------+----------+--------------+ PERO     Full                                                        +---------+---------------+---------+-----------+----------+--------------+   +---------+---------------+---------+-----------+----------+--------------+ LEFT     CompressibilityPhasicitySpontaneityPropertiesThrombus Aging +---------+---------------+---------+-----------+----------+--------------+ CFV      Full           Yes      Yes                                 +---------+---------------+---------+-----------+----------+--------------+ SFJ      Full                                                        +---------+---------------+---------+-----------+----------+--------------+ FV Prox  Full                                                        +---------+---------------+---------+-----------+----------+--------------+ FV Mid   Full                                                        +---------+---------------+---------+-----------+----------+--------------+ FV DistalFull                                                        +---------+---------------+---------+-----------+----------+--------------+ PFV      Full                                                         +---------+---------------+---------+-----------+----------+--------------+ POP      Full           Yes      Yes                                 +---------+---------------+---------+-----------+----------+--------------+ PTV      Full                                                        +---------+---------------+---------+-----------+----------+--------------+  PERO     Full                                                        +---------+---------------+---------+-----------+----------+--------------+     Summary: BILATERAL: - No evidence of deep vein thrombosis seen in the lower extremities, bilaterally.   *See table(s) above for measurements and observations. Electronically signed by Harold Barban MD on 02/23/2020 at 5:39:17 PM.    Final     Labs:  CBC: Recent Labs    02/21/20 0403 02/21/20 0403 02/22/20 0346 02/24/20 1144 02/24/20 1450 02/25/20 0218  WBC 19.5*  --  18.4* 10.2  --  10.6*  HGB 12.9  --  13.4 14.1  --  11.6*  HCT 38.4  --  40.4 41.4  --  35.0*  PLT 324   < > 160 24* 25*  26* 23*   < > = values in this interval not displayed.    COAGS: Recent Labs    02/14/20 0028 02/24/20 1450  INR 1.1 1.3*  APTT 40* 42*    BMP: Recent Labs    02/21/20 0403 02/22/20 0346 02/24/20 0920 02/25/20 0218  NA 140 139 140 139  K 3.3* 3.3* 2.3* 3.0*  CL 113* 109 106 110  CO2 20* 21* 21* 18*  GLUCOSE 139* 123* 143* 140*  BUN 10 7* 6* 8  CALCIUM 8.2* 8.8* 8.5* 8.3*  CREATININE 0.72 0.68 0.73 0.80  GFRNONAA >60 >60 >60 >60  GFRAA >60 >60 >60 >60    LIVER FUNCTION TESTS: Recent Labs    02/16/20 1938 02/20/20 0414  BILITOT  --  0.8  AST  --  12*  ALT  --  12  ALKPHOS  --  34*  PROT  --  4.5*  ALBUMIN 3.8 2.9*    Assessment and Plan:  Cerebral venous thrombosis s/p cerebral angiogram with emergent mechanical thrombectomy of right transverse sinus, sigmoid sinus, torcula, straight sinus, and vein of Galen with recanalization of the deep system  and decreased clot burden in the torcula, right transverse, and sigmoid sinus 02/24/2020 by Dr. Karenann Cai. Patient's condition improving- awake and alert, can spontaneously move all extremities, reports of difficulty with word finding per spouse however patient able to correctly identify items presented to her with occasional delayed response. Right radial incision (arterial) and right cephalic incision (venous) stable, radial pulses 2+ bilaterally. Further plans per neurology/oncology- appreciate and agree with management. NIR to follow.   Electronically Signed: Earley Abide, PA-C 02/25/2020, 9:09 AM   I spent a total of 25 Minutes at the the patient's bedside AND on the patient's hospital floor or unit, greater than 50% of which was counseling/coordinating care for cerebral venous thrombosis s/p revascularization.

## 2020-02-25 NOTE — Progress Notes (Signed)
ANTICOAGULATION CONSULT NOTE - Initial Consult  Pharmacy Consult for argatroban Indication: HIT  Allergies  Allergen Reactions  . Erythromycin Itching and Other (See Comments)    Severe stomach pains, diarrhea    Patient Measurements: Height: 5\' 2"  (157.5 cm) Weight: 69.9 kg (154 lb 1.6 oz) IBW/kg (Calculated) : 50.1  Vital Signs: Temp: 97.6 F (36.4 C) (04/16 1558) Temp Source: Axillary (04/16 0453) BP: 131/65 (04/16 1630) Pulse Rate: 80 (04/16 1630)  Labs: Recent Labs    02/24/20 0920 02/24/20 1144 02/24/20 1450 02/25/20 0218  HGB  --  14.1  --  11.6*  HCT  --  41.4  --  35.0*  PLT  --  24* 25*  26* 23*  APTT  --   --  42*  --   LABPROT  --   --  15.6*  --   INR  --   --  1.3*  --   CREATININE 0.73  --   --  0.80    Estimated Creatinine Clearance: 57.3 mL/min (by C-G formula based on SCr of 0.8 mg/dL).  Assessment: 37 f initially admitted 4/4 with acute IVH and thrombocythemia with PLTC of 990k. Patient was ultimately diagnosed with polythemia, now s/p leukophoresis, therapeutic phlebotomy, and initiation of hydroxyurea.   4/13 - found to have B/L PE - decision was risk of AC was too great and IVC placed  Yesterday CT scan showed extensive thrombus within confluence of sinuses and sent to IR with mechanical thrombectomy x 5.  Of note, plt on 4/4 990>>4/13 160>>4/15 26 and now 23 today DIC panel sent with DDimer > 20, fibrinogen 203 Hep induced antibody is positive at 2.553 (4T score high at 6 - 8 - unknown if we can rule out other causes of clot, but still very high)  Patient received 3 doses of heparin during pharesis and no other heparin documented.   Discussed with Dr Shelba Flake to initiate argatroban to treat HIT associated clots (since risk of further clotting may be worse than risk of re-bleed), though we will have to be careful and target the low end of therapeutic range due to recent IVH  Normally, once therapeutic we would do daily aPTT - in this  case, may need to stick with standard q12h aPTT with therapeutic levels to ensure we do not inadvertently overshoot and get higher than desired levels  Goal of Therapy:  aPTT 50 - 60 seconds Monitor platelets by anticoagulation protocol: Yes   Initial Plan:  Initiate argatroban 0.5 mcg/kg/min Initial aPTT 1830 Will check aPTT q2h until therapeutic, then q12h   8:12 PM A/Plan: Initial aPTT within goal at 58 - no issues per RN Continue current rate 0.5 mcg/kg/min  10:09 PM A/Plan: Next aPTT resulted as 67s - high end of our lower goal.  Patient with some bruising (Not new) but no other concerns for acute bleeding Reduce rate by 10% to 0.45 mcg/kg/min Discussed change with RN and will recheck aPTT in 2 hours  Barth Kirks, PharmD, BCPS, BCCCP Clinical Pharmacist [phone]  Please check AMION for all [place] numbers  02/25/2020 4:59 PM

## 2020-02-25 NOTE — Progress Notes (Signed)
Patient in MRI and restless per SWOT RN Michelene Heady. Paged Dr. Leonie Man and received verbal orders for 0.5mg  IV Ativan initial and  1mg  IV Ativan if first dose not effective. Will page MD for further orders if needed.  [employer] RN

## 2020-02-25 NOTE — Progress Notes (Addendum)
eLink Physician-Brief Progress Note Patient Name: DANELLE ZOELLICK DOB: 03-24-1946 MRN: [id number]   Date of Service  02/25/2020  HPI/Events of Note  SBP > 140 in this patient with BP goal SBP < 140 due to recent cerebral sinus venous thrombosis s/p IR intervention.   eICU Interventions  Changed labetalol PRN parameter to SBP > 140.   ADDENDUM  Also ordered a one time dose of hydralazine 10-20mg  IV for BP control since HR 60 after a dose of labetalol earlier.  Intervention Category Intermediate Interventions: Hypertension - evaluation and management  Marily Lente Cadynce Garrette 02/25/2020, 2:06 AM

## 2020-02-25 NOTE — Progress Notes (Signed)
[facility] ADULT ICU REPLACEMENT PROTOCOL FOR AM LAB REPLACEMENT ONLY  The patient does apply for the [facility] Adult ICU Electrolyte Replacment Protocol based on the criteria listed below:   1. Is GFR >/= 40 ml/min? Yes.    Patient's GFR today is >60 2. Is urine output >/= 0.5 ml/kg/hr for the last 6 hours? Yes.   Patient's UOP is 1.2 ml/kg/hr 3. Is BUN < 60 mg/dL? Yes.    Patient's BUN today is 8 4. Abnormal electrolyte(s): Potassium 3.0 5. Ordered repletion with: Potassium per protocol 6. If a panic level lab has been reported, has the CCM MD in charge been notified? No..   Physician:    Adam Phenix 02/25/2020 3:51 AM

## 2020-02-26 ENCOUNTER — Inpatient Hospital Stay (HOSPITAL_COMMUNITY): Payer: Medicare HMO

## 2020-02-26 DIAGNOSIS — D7582 Heparin induced thrombocytopenia (HIT): Secondary | ICD-10-CM

## 2020-02-26 DIAGNOSIS — E876 Hypokalemia: Secondary | ICD-10-CM

## 2020-02-26 DIAGNOSIS — E854 Organ-limited amyloidosis: Secondary | ICD-10-CM | POA: Diagnosis not present

## 2020-02-26 DIAGNOSIS — R1312 Dysphagia, oropharyngeal phase: Secondary | ICD-10-CM | POA: Diagnosis not present

## 2020-02-26 DIAGNOSIS — G08 Intracranial and intraspinal phlebitis and thrombophlebitis: Secondary | ICD-10-CM

## 2020-02-26 DIAGNOSIS — I4892 Unspecified atrial flutter: Secondary | ICD-10-CM | POA: Diagnosis not present

## 2020-02-26 DIAGNOSIS — I615 Nontraumatic intracerebral hemorrhage, intraventricular: Secondary | ICD-10-CM | POA: Diagnosis not present

## 2020-02-26 DIAGNOSIS — I2699 Other pulmonary embolism without acute cor pulmonale: Secondary | ICD-10-CM

## 2020-02-26 LAB — BASIC METABOLIC PANEL
Anion gap: 8 (ref 5–15)
BUN: 9 mg/dL (ref 8–23)
CO2: 19 mmol/L — ABNORMAL LOW (ref 22–32)
Calcium: 8.3 mg/dL — ABNORMAL LOW (ref 8.9–10.3)
Chloride: 112 mmol/L — ABNORMAL HIGH (ref 98–111)
Creatinine, Ser: 0.8 mg/dL (ref 0.44–1.00)
GFR calc Af Amer: >60 mL/min (ref >60)
GFR calc non Af Amer: >60 mL/min (ref >60)
Glucose, Bld: 118 mg/dL — ABNORMAL HIGH (ref 70–99)
Potassium: 2.8 mmol/L — ABNORMAL LOW (ref 3.5–5.1)
Sodium: 139 mmol/L (ref 135–145)

## 2020-02-26 LAB — BPAM PLATELET PHERESIS
Blood Product Expiration Date: 202104172359
ISSUE DATE / TIME: 202104170027
Unit Type and Rh: 6200

## 2020-02-26 LAB — CBC WITH DIFFERENTIAL/PLATELET
Abs Immature Granulocytes: 0.16 10*3/uL — ABNORMAL HIGH (ref 0.00–0.07)
Basophils Absolute: 0 10*3/uL (ref 0.0–0.1)
Basophils Relative: 0 %
Eosinophils Absolute: 0.1 10*3/uL (ref 0.0–0.5)
Eosinophils Relative: 1 %
HCT: 32.5 % — ABNORMAL LOW (ref 36.0–46.0)
Hemoglobin: 10.6 g/dL — ABNORMAL LOW (ref 12.0–15.0)
Immature Granulocytes: 1 %
Lymphocytes Relative: 10 %
Lymphs Abs: 1.2 10*3/uL (ref 0.7–4.0)
MCH: 30.3 pg (ref 26.0–34.0)
MCHC: 32.6 g/dL (ref 30.0–36.0)
MCV: 92.9 fL (ref 80.0–100.0)
Monocytes Absolute: 0.8 10*3/uL (ref 0.1–1.0)
Monocytes Relative: 7 %
Neutro Abs: 9 10*3/uL — ABNORMAL HIGH (ref 1.7–7.7)
Neutrophils Relative %: 81 %
Platelets: 38 10*3/uL — ABNORMAL LOW (ref 150–400)
RBC: 3.5 MIL/uL — ABNORMAL LOW (ref 3.87–5.11)
RDW: 16 % — ABNORMAL HIGH (ref 11.5–15.5)
WBC: 11.2 10*3/uL — ABNORMAL HIGH (ref 4.0–10.5)
nRBC: 0 % (ref 0.0–0.2)

## 2020-02-26 LAB — CBC
HCT: 32.4 % — ABNORMAL LOW (ref 36.0–46.0)
Hemoglobin: 10.6 g/dL — ABNORMAL LOW (ref 12.0–15.0)
MCH: 30.5 pg (ref 26.0–34.0)
MCHC: 32.7 g/dL (ref 30.0–36.0)
MCV: 93.1 fL (ref 80.0–100.0)
Platelets: 41 10*3/uL — ABNORMAL LOW (ref 150–400)
RBC: 3.48 MIL/uL — ABNORMAL LOW (ref 3.87–5.11)
RDW: 16.2 % — ABNORMAL HIGH (ref 11.5–15.5)
WBC: 11.2 10*3/uL — ABNORMAL HIGH (ref 4.0–10.5)
nRBC: 0 % (ref 0.0–0.2)

## 2020-02-26 LAB — PREPARE PLATELET PHERESIS: Unit division: 0

## 2020-02-26 LAB — APTT
aPTT: 62 seconds — ABNORMAL HIGH (ref 24–36)
aPTT: 61 seconds — ABNORMAL HIGH (ref 24–36)
aPTT: 58 seconds — ABNORMAL HIGH (ref 24–36)
aPTT: 65 seconds — ABNORMAL HIGH (ref 24–36)

## 2020-02-26 MED ORDER — AMIODARONE HCL 200 MG PO TABS
200.0000 mg | ORAL_TABLET | Freq: Two times a day (BID) | ORAL | Status: DC
  Administered 2020-02-26 – 2020-03-02 (×8): 200 mg
  Filled 2020-02-26 (×9): qty 1

## 2020-02-26 MED ORDER — HYDRALAZINE HCL 25 MG PO TABS
25.0000 mg | ORAL_TABLET | Freq: Three times a day (TID) | ORAL | Status: DC
  Administered 2020-02-26 – 2020-02-28 (×3): 25 mg
  Filled 2020-02-26 (×4): qty 1

## 2020-02-26 MED ORDER — POTASSIUM CHLORIDE 20 MEQ/15ML (10%) PO SOLN: 20.0000 meq | Freq: Once | ORAL | Status: DC

## 2020-02-26 MED ORDER — ATORVASTATIN CALCIUM 10 MG PO TABS
10.0000 mg | ORAL_TABLET | Freq: Every day | ORAL | Status: DC
  Administered 2020-02-28 – 2020-03-10 (×12): 10 mg
  Filled 2020-02-26 (×12): qty 1

## 2020-02-26 MED ORDER — POTASSIUM CHLORIDE 20 MEQ/15ML (10%) PO SOLN
40.0000 meq | Freq: Two times a day (BID) | ORAL | Status: DC
  Administered 2020-02-26 (×2): 40 meq
  Filled 2020-02-26 (×4): qty 30

## 2020-02-26 MED ORDER — ALLOPURINOL 300 MG PO TABS
300.0000 mg | ORAL_TABLET | Freq: Every day | ORAL | Status: DC
  Administered 2020-02-28: 300 mg
  Filled 2020-02-26: qty 1

## 2020-02-26 MED ORDER — LOSARTAN POTASSIUM 50 MG PO TABS
50.0000 mg | ORAL_TABLET | Freq: Two times a day (BID) | ORAL | Status: DC
  Administered 2020-02-26 – 2020-03-09 (×23): 50 mg
  Filled 2020-02-26 (×25): qty 1

## 2020-02-26 MED ORDER — AMLODIPINE BESYLATE 10 MG PO TABS
10.0000 mg | ORAL_TABLET | Freq: Every day | ORAL | Status: DC
  Administered 2020-02-28 – 2020-03-09 (×11): 10 mg
  Filled 2020-02-26 (×12): qty 1

## 2020-02-26 MED ORDER — PREDNISONE 10 MG PO TABS
5.0000 mg | ORAL_TABLET | Freq: Every day | ORAL | Status: AC
  Filled 2020-02-26 (×2): qty 1

## 2020-02-26 MED ORDER — CHLORHEXIDINE GLUCONATE CLOTH 2 % EX PADS
6.0000 | MEDICATED_PAD | Freq: Every day | CUTANEOUS | Status: DC
  Administered 2020-02-26 – 2020-02-28 (×3): 6 via TOPICAL

## 2020-02-26 MED ORDER — SENNOSIDES-DOCUSATE SODIUM 8.6-50 MG PO TABS
1.0000 | ORAL_TABLET | Freq: Two times a day (BID) | ORAL | Status: DC
  Administered 2020-02-26 – 2020-03-09 (×7): 1
  Filled 2020-02-26 (×14): qty 1

## 2020-02-26 MED ORDER — LIDOCAINE VISCOUS HCL 2 % MT SOLN
OROMUCOSAL | Status: AC
  Filled 2020-02-26: qty 15

## 2020-02-26 MED ORDER — PANTOPRAZOLE SODIUM 40 MG PO PACK
40.0000 mg | PACK | ORAL | Status: AC
  Administered 2020-02-26 (×2): 40 mg
  Filled 2020-02-26 (×2): qty 20

## 2020-02-26 MED ORDER — LEVOTHYROXINE SODIUM 25 MCG PO TABS
125.0000 ug | ORAL_TABLET | Freq: Every day | ORAL | Status: DC
  Administered 2020-02-29 – 2020-03-10 (×11): 125 ug
  Filled 2020-02-26 (×12): qty 1

## 2020-02-26 MED ORDER — TOPIRAMATE 25 MG PO TABS
50.0000 mg | ORAL_TABLET | Freq: Two times a day (BID) | ORAL | Status: DC
  Administered 2020-02-26 – 2020-03-05 (×15): 50 mg
  Filled 2020-02-26 (×19): qty 2

## 2020-02-26 MED ORDER — PANTOPRAZOLE SODIUM 40 MG PO PACK: 40.0000 mg | PACK | Freq: Every day | ORAL | Status: DC

## 2020-02-26 NOTE — Progress Notes (Signed)
0800 Husband and son at bedside requesting visitation for pt's second son r/t possible catastrophe (see hematology note) Pt not arousable to stay awake; unsafe for po intake most of the day. Meds held and provider aware. [facility] team not here on Saturday.  20 MD will place NGT in IR via fluoroscopy Charge RN ok'ed pt's husband and 2 sons visiting (3 visitors total)  1200 CT head & IR for NGT During NGT placement, pt became combative and was not redirectable. Despite BL mitts, pt able to grab at lines and pull hard enough to remove. NGT replaced and order obtained for BL soft wrist restraints. Pt's family educated and expressed understanding.  1400 NGT taped, not bridled. Attempting to get device ordered and placed.  1800 Pt found restrained but with NGT and securing device loose in bed. MD notified. No order to replace.  1830 Pt briefly alert and interacting appropriately with family at bedside. Answering questions with some perseveration and difficulty finding words, but family is encouraged by the interaction.

## 2020-02-26 NOTE — Progress Notes (Signed)
[place] for argatroban Indication: HIT  Allergies  Allergen Reactions  . Erythromycin Itching and Other (See Comments)    Severe stomach pains, diarrhea  . Heparin     Likely HIT    Patient Measurements: Height: 5\' 2"  (157.5 cm) Weight: 71.4 kg (157 lb 6.5 oz) IBW/kg (Calculated) : 50.1  Vital Signs: Temp: 97.1 F (36.2 C) (04/17 0805) Temp Source: Axillary (04/17 0805) BP: 157/74 (04/17 0600) Pulse Rate: 67 (04/17 0600)  Labs: Recent Labs    02/24/20 0920 02/24/20 1144 02/24/20 1450 02/24/20 1450 02/25/20 0218 02/25/20 0218 02/25/20 1853 02/25/20 2330 02/26/20 0259 02/26/20 0818  HGB  --    < >  --    < > 11.6*   < >  --   --  10.6* 10.6*  HCT  --    < >  --    < > 35.0*  --   --   --  32.5* 32.4*  PLT  --    < > 25*  26*   < > 23*  --   --   --  38* 41*  APTT  --   --  42*  --   --   --    < > 70* 62* 61*  LABPROT  --   --  15.6*  --   --   --   --   --   --   --   INR  --   --  1.3*  --   --   --   --   --   --   --   CREATININE 0.73  --   --   --  0.80  --   --   --  0.80  --    < > = values in this interval not displayed.    Estimated Creatinine Clearance: 57.9 mL/min (by C-G formula based on SCr of 0.8 mg/dL).  Assessment: 52 f initially admitted 4/4 with acute IVH and thrombocythemia with PLTC of 990k. Patient was ultimately diagnosed with polythemia, now s/p leukophoresis, therapeutic phlebotomy, and initiation of hydroxyurea. On 4/13 - found to have B/L PE - decision was risk of AC was too great and IVC placed. CT scan 4/15 showed extensive thrombus within confluence of sinuses and sent to IR with mechanical thrombectomy x5.  Of note, plt on 4/4 990>>4/13 160>>4/15 26 and now 23 4/16. DIC panel sent with DDimer > 20, fibrinogen 203. Heparin antibody OD 2.55 with 4T score ~6-8 (high likelihood). Patient received 3 doses of heparin during pharesis and no other heparin documented.   Discussed with Dr Shelba Flake  (oncology) to initiate argatroban to treat HIT associated clots (since risk of further clotting may be worse than risk of re-bleed), though we will have to be careful and target the low end of therapeutic range due to recent IVH. Normally, once therapeutic we would do daily aPTT - in this case, may need to stick with standard q12h aPTT with therapeutic levels to ensure we do not inadvertently overshoot and get higher than desired levels  Repeat aPTT this morning still slightly above goal at 61 seconds. Pltc is now beginning to improve. Will send SRA to confirm diagnosis of HIT though highly unlikely it will be negative.  Goal of Therapy:  aPTT 50 - 60 seconds Monitor platelets by anticoagulation protocol: Yes   Initial Plan:  Reduce slightly argatroban to 0.3 mcg/kg/min Recheck aPTT in 4h  Send SRA  Arrie Senate, PharmD, BCPS Clinical Pharmacist [phone] Please check AMION for all [facility] numbers 02/26/2020

## 2020-02-26 NOTE — Progress Notes (Signed)
Progress Note  Patient Name: [name] Date of Encounter: 02/26/2020  Primary Cardiologist:   Quay Burow, MD   Subjective   Arousable.  Following minimal commands but very somnolent.    Inpatient Medications    Scheduled Meds: .  stroke: mapping our early stages of recovery book   Does not apply Once  . allopurinol  300 mg Oral Daily  . amiodarone  200 mg Oral BID  . amLODipine  10 mg Oral Daily  . atorvastatin  10 mg Oral Daily  . chlorhexidine  15 mL Mouth Rinse BID  . Chlorhexidine Gluconate Cloth  6 each Topical Daily  . fluticasone furoate-vilanterol  1 puff Inhalation Daily  . hydrALAZINE  10-20 mg Intravenous Once  . hydrALAZINE  25 mg Oral TID  . levothyroxine  125 mcg Oral QAC breakfast  . losartan  50 mg Oral BID  . pantoprazole  40 mg Oral Daily  . predniSONE  5 mg Oral Q breakfast   Followed by  . [START ON 02/27/2020] predniSONE  5 mg Oral Q breakfast  . senna-docusate  1 tablet Oral BID  . sodium chloride flush  10-40 mL Intracatheter Q12H  . topiramate  50 mg Oral BID   Continuous Infusions: . sodium chloride 75 mL/hr at 02/26/20 0557  . argatroban 0.324 mcg/kg/min (02/26/20 0557)  . diltiazem (CARDIZEM) infusion     PRN Meds: acetaminophen **OR** acetaminophen (TYLENOL) oral liquid 160 mg/5 mL **OR** acetaminophen, butalbital-acetaminophen-caffeine, labetalol, ondansetron (ZOFRAN) IV, sodium chloride flush   Vital Signs    Vitals:   02/26/20 0530 02/26/20 0545 02/26/20 0600 02/26/20 0805  BP: (!) 150/68  (!) 157/74   Pulse: 66 66 67   Resp:      Temp: 99.1 F (37.3 C)   (!) 97.1 F (36.2 C)  TempSrc: Oral   Axillary  SpO2: 95% 95% 95%   Weight:   71.4 kg   Height:        Intake/Output Summary (Last 24 hours) at 02/26/2020 0839 Last data filed at 02/26/2020 0557 Gross per 24 hour  Intake 1230.69 ml  Output 550 ml  Net 680.69 ml   Filed Weights   02/15/20 0500 02/25/20 0645 02/26/20 0600  Weight: 66.3 kg 69.9 kg 71.4 kg     Telemetry    NSR - Personally Reviewed  ECG    NA - Personally Reviewed  Physical Exam   GEN: No acute distress.   Neck: No  JVD Cardiac: RRR, NO murmurs, rubs, or gallops.  Respiratory: Clear to auscultation bilaterally. GI: Soft, nontender, non-distended  MS: No  edema; No deformity. Neuro:  Nonfocal but not purposeful.  Psych: Unable to assess.  Labs    Chemistry Recent Labs  Lab 02/20/20 0414 02/21/20 0403 02/24/20 0920 02/25/20 0218 02/26/20 0259  NA 138   < > 140 139 139  K 3.4*   < > 2.3* 3.0* 2.8*  CL 109   < > 106 110 112*  CO2 21*   < > 21* 18* 19*  GLUCOSE 146*   < > 143* 140* 118*  BUN 11   < > 6* 8 9  CREATININE 0.81   < > 0.73 0.80 0.80  CALCIUM 8.3*   < > 8.5* 8.3* 8.3*  PROT 4.5*  --   --   --   --   ALBUMIN 2.9*  --   --   --   --   AST 12*  --   --   --   --  ALT 12  --   --   --   --   ALKPHOS 34*  --   --   --   --   BILITOT 0.8  --   --   --   --   GFRNONAA >60   < > >60 >60 >60  GFRAA >60   < > >60 >60 >60  ANIONGAP 8   < > _0 < > = values in this interval not displayed.     Hematology Recent Labs  Lab 02/24/20 1144 02/24/20 1144 02/24/20 1450 02/25/20 0218 02/26/20 0259  WBC 10.2  --   --  10.6* 11.2*  RBC 4.71  --   --  3.86* 3.50*  HGB 14.1  --   --  11.6* 10.6*  HCT 41.4  --   --  35.0* 32.5*  MCV 87.9  --   --  90.7 92.9  MCH 29.9  --   --  30.1 30.3  MCHC 34.1  --   --  33.1 32.6  RDW 15.0  --   --  15.7* 16.0*  PLT 24*   < > 25*  26* 23* 38*   < > = values in this interval not displayed.    Cardiac EnzymesNo results for input(s): TROPONINI in the last 168 hours. No results for input(s): TROPIPOC in the last 168 hours.   BNPNo results for input(s): BNP, PROBNP in the last 168 hours.   DDimer  Recent Labs  Lab 02/24/20 1450  DDIMER >20.00*     Radiology    CT Head Wo Contrast  Addendum Date: 02/24/2020   ADDENDUM REPORT: 02/24/2020 15:17 ADDENDUM: These results were called by telephone at the  time of interpretation on 02/24/2020 at 2:40 pm to provider PRAMOD SETHI , who verbally acknowledged these results. Electronically Signed   By: Kellie Simmering DO   On: 02/24/2020 15:17   Result Date: 02/24/2020 CLINICAL DATA:  Stroke, follow-up. EXAM: CT HEAD WITHOUT CONTRAST TECHNIQUE: Contiguous axial images were obtained from the base of the skull through the vertex without intravenous contrast. COMPARISON:  Head CT 02/19/2020, brain MRI 02/14/2020 FINDINGS: Brain: Interval decrease in conspicuity of intraventricular hemorrhage again most notably within the atrium and temporal horn of the right lateral ventricle. Redemonstrated small volume acute hemorrhage layering within the occipital horns. No ventriculomegaly. No midline shift. Redemonstrated advanced cerebral white matter hypodensity. No demarcated cortical infarct. Cerebral volume is normal for age. Vascular: Atherosclerotic arterial calcifications. New as compared to prior examination 02/19/2020 there is increased density of the straight sinus, vein of Galen and inferior sagittal sinus. Hyperdensity is also questioned within the lateral left transverse sinus. Skull: Normal. Negative for fracture or focal lesion. Sinuses/Orbits: Frothy secretions layering within the right maxillary sinus. No significant mastoid effusion. IMPRESSION: Increased density of the straight sinus, vein of Galen and inferior sagittal sinus. Hyperdensity is also questioned within portions of the lateral left transverse sinus. Findings are suspicious for dural venous sinus thrombosis. Consider CT venography for further evaluation. Interval decrease in conspicuity of intraventricular hemorrhage again most notable within the atrium and temporal horn of the right lateral ventricle. No ventriculomegaly. Redemonstrated severe cerebral white matter disease. Electronically Signed: By: Kellie Simmering DO On: 02/24/2020 14:34   MR BRAIN WO CONTRAST  Result Date: 02/25/2020 CLINICAL DATA:   Stroke, follow-up. Additional history obtained from [place] post venous thrombectomy yesterday. EXAM: MRI HEAD WITHOUT CONTRAST TECHNIQUE: Multiplanar, multiecho pulse sequences of the  brain and surrounding structures were obtained without intravenous contrast. COMPARISON:  CT venogram 02/24/2020, head CT 02/24/2020, MRI/MRA head 02/14/2019 FINDINGS: Brain: New from prior MRI 02/14/2020 there are numerous scattered tiny acute infarcts within the bilateral cerebral and cerebellar hemispheres. Additionally, there is an acute cortically based infarct within the left temporal occipital lobes which measures 2.3 x 1.3 cm in transaxial dimensions (series 5, image 66). Small foci of diffusion weighted hyperintensity within the callosal splenium with corresponding intermediate signal on the ADC map, likely reflecting small subacute infarcts. Subacute intraventricular hemorrhage predominantly within the atrium of the right lateral ventricle has not significantly changed in extent. No hydrocephalus. Also new from prior MRI there is parenchymal swelling and cortical/subcortical edema within the left temporal lobe and frontal operculum measuring 5.5 x 1.9 cm (AP x CC). This may be secondary to venous hypertension. No evidence of acute infarct at this site on the current exam. Redemonstrated extensive confluent T2/FLAIR hyperintensity within the cerebral white matter and patchy T2 hyperintense signal changes within the basal ganglia, thalami and pons. Findings are nonspecific but may reflect chronic small vessel ischemic disease. Numerous chronic microhemorrhages again present within the cerebral and cerebellar hemispheres and brainstem. Vascular: Flow voids maintained within the proximal large arterial vessels. Please refer to MR venography separately reported. Skull and upper cervical spine: No focal marrow lesion. Sinuses/Orbits: Visualized orbits demonstrate no acute abnormality. Right maxillary  sinus mucous retention cyst. Trace ethmoid sinus mucosal thickening. No significant mastoid effusion. IMPRESSION: 1. Numerous tiny acute infarcts within the bilateral cerebral and cerebellar hemispheres. Findings are suspicious for an embolic process. 2. 2.3 cm acute cortically based infarct within the right temporal occipital lobes likely reflecting a venous infarct. 3. Small subacute infarcts within the callosal splenium. 4. New region of parenchymal swelling and T2 hyperintensity within the left temporal lobe and frontal operculum which may reflect edema related to venous hypertension. No evidence of acute infarct at this site on the current exam. 5. Unchanged subacute intraventricular hemorrhage predominantly within the atrium of the right lateral ventricle. No hydrocephalus. 6. Redemonstrated extensive T2 hyperintense signal changes within the cerebral white matter, basal ganglia, thalami and pons which is nonspecific, but may reflect sequela of chronic small vessel ischemia. 7. Redemonstrated numerous supratentorial and infratentorial chronic microhemorrhages which may be related to hypertensive microangiopathy or amyloid angiopathy. Electronically Signed   By: Kellie Simmering DO   On: 02/25/2020 13:55   IR THROMBECT VENO MECH MOD SED  Result Date: 02/25/2020 INDICATION: 74 year old female with past medical history significant for hypertension, hyperlipidemia and transient ischemic attack who presented to emergency with headache on 02/14/2020. At admission, markedly elevated platelet and red blood cell counts was noted consistent with polycythemia vera. She underwent a head CT that showed a right intraventricular hemorrhage. Follow-up CT of performed on 02/24/2020 showed increased density of the vein of Galen, straight sinus and right transverse and sigmoid sinuses concerning for cerebral venous sinus thrombosis which was confirmed with a CT venography. We were consulted for venous mechanical thrombectomy.  EXAM: Ultrasound-guided arterial access Ultrasound-guided venous access Diagnostic cerebral angiogram Mechanical thrombectomy of cerebral venous thrombosis COMPARISON:  CT venogram 02/24/2020 MEDICATIONS: [id number] of nitroglicerin. ANESTHESIA/SEDATION: General anesthesia performed. CONTRAST:  120 mL Omnipaque 240 FLUOROSCOPY TIME:  Fluoroscopy Time: 88 minutes 12 seconds (1,724 mGy). COMPLICATIONS: None immediate. TECHNIQUE: Informed written consent was obtained from the patient's husband after a thorough discussion of the procedural risks, benefits and alternatives. All questions were addressed. Maximal Sterile Barrier Technique was utilized including  caps, mask, sterile gowns, sterile gloves, sterile drape, hand hygiene and skin antiseptic. A timeout was performed prior to the initiation of the procedure. Real-time ultrasound guidance was utilized for vascular access including the acquisition of a permanent ultrasound image documenting patency of the accessed vessel. Using the modified Seldinger technique and a micropuncture kit, access was gained to the distal right radial artery at the anatomical snuffbox and a 5 French sheath was placed. Slow intra arterial infusion of 765 mcg nitroglicerin diluted in patient's own blood was performed. No significant fluctuation in patient's blood pressure seen. Then, a right radial artery roadmap was obtained via sheath side port. Normal brachial artery branching pattern seen. No significant anatomical variation. The right radial artery caliber is adequate for vascular access. Then, a 5 [place] Simmons 2 glide catheter was navigated over a 0.035 Terumo Glidewire into the right subclavian artery and then into the descending aorta. The catheter tip was reformed and then placed in the left common carotid artery. Frontal and lateral road map of the left ICA were obtained. The catheter was then advanced over the wire into the left ICA. Frontal and lateral angiograms of the head were  obtained. The catheter was then retracted and advanced into the right vertebral artery. Townes and lateral views of the head were obtained. The catheter was placed into the left common carotid artery. From bilateral roadmap were obtained. The catheter was navigated into the right internal carotid artery. Frontal and lateral angiograms of the head were obtained. FINDINGS: Subocclusive filling defects are noted within the torcular, right transverse and sigmoid sinuses. No opacification of the straight sinus seen with bilateral anterior and posterior circulation injection. The left internal cerebral vein appears to drain anteriorly. Delayed opacification of the right internal cerebral vein seen without clear drainage route. The left transverse and sigmoid sinuses at faint opacification on the right vertebral artery injection and appear hypoplastic. There is brisk contrast opacification of the bilateral ACA, MCA and PCA vascular trees. Luminal caliber is smooth and tapering. No high-flow, early draining vein. No aneurysm, AVM or dural AV fistula identified. No areas of abnormal hypervascularity. PROCEDURE: The diagnostic catheter was maintained in the right internal carotid artery for control angiograms and connected to a saline flush containing argatroban given concern for possible heparin-induced thrombocytopenia. The right arm was prepped and draped in a sterile fashion. Real-time ultrasound guidance was utilized for vascular access including the acquisition of a permanent ultrasound image documenting patency of the accessed vessel. Using the modified Seldinger technique and a micropuncture kit, access was gained to the right cephalic vein at the arm and a 5 French sheath was placed. Then, using fluoroscopy guidance, a 5 [place] vert catheter was navigated over a 0.035 Terumo Glidewire into the right subclavian vein and then navigated into the right jugular vein. Right ICA angiograms were obtained utilizing the  arterial access in right anterior oblique and lateral views of the head. Using biplane fluoroscopy a 0.035 inch exchange length Glidewire the diagnostic catheter and 5 French sheath were exchanged for an infinity long sheath which was placed in the jugular bulb. Under biplane roadmap, a large bore aspiration catheter was navigated over a phenom 21 microcatheter and a synchro support microguidewire into the right transverse sinus. The microcatheter was then navigated over the wire into the straight sinus. Then, a 6 mm Trevo stent retriever was deployed spanning the straight sinus. The device was allowed to intercalated with the clot for 4 minutes. The microcatheter was removed. The aspiration  catheter was advanced to the level of occlusion and connected to a penumbra aspiration pump. The thrombectomy device and aspiration catheter were removed under constant aspiration. Right ICA angiograms in right anterior oblique and lateral views of the head showed persistent straight sinus occlusion. Under biplane roadmap, a large bore aspiration catheter was navigated over a phenom 21 microcatheter and a synchro support microguidewire into the right transverse sinus. The microcatheter was then navigated over the wire into the straight sinus. Angiograms obtained from microcatheter contrast injection showed large burden of clot within the Vein of Galen. The microcatheter was then advanced into the vein of Galen. Then, a 6 mm Trevo stent retriever was deployed spanning the the vein of Galen and straight sinus. The device was allowed to intercalated with the clot for 4 minutes. The microcatheter was removed. The aspiration catheter was advanced to the level of occlusion and connected to a penumbra aspiration pump. The thrombectomy device and aspiration catheter were removed under constant aspiration. Right ICA angiograms in right anterior oblique and lateral views of the head showed delayed Galen and straight sinus opacification.  Under biplane roadmap, a large bore aspiration catheter was navigated over a phenom 21 microcatheter and a synchro support microguidewire into the right transverse sinus. The microcatheter was then navigated over the wire into the straight sinus. Then, a 6 mm Trevo stent retriever was deployed spanning the straight sinus. The device was allowed to intercalated with the clot for 4 minutes. The microcatheter was removed. The aspiration catheter was advanced to the level of occlusion and connected to a penumbra aspiration pump. The thrombectomy device and aspiration catheter were removed under constant aspiration. Right ICA angiograms in right anterior oblique and lateral views of the head showed persistent delayed Galen and straight sinus opacification with decreased clot burden. Under biplane roadmap, a large bore aspiration catheter was navigated over a phenom 21 microcatheter and a synchro support microguidewire into the right transverse sinus. The microcatheter was then navigated over the wire into the straight sinus. Then, a 6 mm Trevo stent retriever was deployed spanning the proximal straight sinus, torcula and proximal right transverse sinus. The device was allowed to intercalated with the clot for 4 minutes. The aspiration catheter was advanced to the level of occlusion and connected to a penumbra aspiration pump. The thrombectomy device and aspiration catheter were removed under constant aspiration. Right ICA angiograms in right anterior oblique and lateral views of the head showed decreased clot burden in the torcula. Under biplane roadmap, a large bore aspiration catheter was navigated over a phenom 21 microcatheter and a synchro support microguidewire into the right transverse sinus. The microcatheter was then navigated over the wire into the sigmoid sinus. Then, a 6 mm Trevo stent retriever was deployed spanning the right sigmoid and transverse sinus. The device was allowed to intercalated with the clot  for 4 minutes. The aspiration catheter was advanced to the level of occlusion and connected to a penumbra aspiration pump. The thrombectomy device and aspiration catheter were removed under constant aspiration. Follow-up right ICA angiograms showed delayed opacification of vein of Galen and faint opacification of the straight sinus. The diagnostic at the catheter retracted from the right ACA and advanced into the right vertebral artery. Townes and lateral angiograms of the head were obtained showing anterograde at the CT opacification of the vein of Galen with residual filling defect and retrograde opacification of the straight sinus. The catheter was subsequently withdrawn. Flat panel CT of the head was obtained and  post processed in a separate workstation with concurrent attending physician supervision. Selected images were sent to PACS. No evidence of hemorrhagic complication seen. The arterial sheath in the snuff box was removed and inflatable band was placed at the access site for patent hemostasis. Subsequently, the venous sheath was removed and manual pressure was held for 15 minutes with complete hemostasis. IMPRESSION: Mechanical thrombectomy with recanalization of the vein of Galen and straight sinus and decreased clot burden in the torcula, right transverse and sigmoid sinuses. Residual nonocclusive clot is seen in the vein of Galen. PLAN: 1. Continued close monitoring at ICU. 2. Follow-up with neurology for possible anticoagulation once thrombocytopenia is resolved. Electronically Signed   By: Pedro Earls M.D.   On: 02/25/2020 18:14   IR US Guide Vasc Access Right  Result Date: 02/25/2020 INDICATION: 74 year old female with past medical history significant for hypertension, hyperlipidemia and transient ischemic attack who presented to emergency with headache on 02/14/2020. At admission, markedly elevated platelet and red blood cell counts was noted consistent with polycythemia vera.  She underwent a head CT that showed a right intraventricular hemorrhage. Follow-up CT of performed on 02/24/2020 showed increased density of the vein of Galen, straight sinus and right transverse and sigmoid sinuses concerning for cerebral venous sinus thrombosis which was confirmed with a CT venography. We were consulted for venous mechanical thrombectomy. EXAM: Ultrasound-guided arterial access Ultrasound-guided venous access Diagnostic cerebral angiogram Mechanical thrombectomy of cerebral venous thrombosis COMPARISON:  CT venogram 02/24/2020 MEDICATIONS: [id number] of nitroglicerin. ANESTHESIA/SEDATION: General anesthesia performed. CONTRAST:  120 mL Omnipaque 240 FLUOROSCOPY TIME:  Fluoroscopy Time: 88 minutes 12 seconds (1,724 mGy). COMPLICATIONS: None immediate. TECHNIQUE: Informed written consent was obtained from the patient's husband after a thorough discussion of the procedural risks, benefits and alternatives. All questions were addressed. Maximal Sterile Barrier Technique was utilized including caps, mask, sterile gowns, sterile gloves, sterile drape, hand hygiene and skin antiseptic. A timeout was performed prior to the initiation of the procedure. Real-time ultrasound guidance was utilized for vascular access including the acquisition of a permanent ultrasound image documenting patency of the accessed vessel. Using the modified Seldinger technique and a micropuncture kit, access was gained to the distal right radial artery at the anatomical snuffbox and a 5 French sheath was placed. Slow intra arterial infusion of 045 mcg nitroglicerin diluted in patient's own blood was performed. No significant fluctuation in patient's blood pressure seen. Then, a right radial artery roadmap was obtained via sheath side port. Normal brachial artery branching pattern seen. No significant anatomical variation. The right radial artery caliber is adequate for vascular access. Then, a 5 [place] Simmons 2 glide catheter was  navigated over a 0.035 Terumo Glidewire into the right subclavian artery and then into the descending aorta. The catheter tip was reformed and then placed in the left common carotid artery. Frontal and lateral road map of the left ICA were obtained. The catheter was then advanced over the wire into the left ICA. Frontal and lateral angiograms of the head were obtained. The catheter was then retracted and advanced into the right vertebral artery. Townes and lateral views of the head were obtained. The catheter was placed into the left common carotid artery. From bilateral roadmap were obtained. The catheter was navigated into the right internal carotid artery. Frontal and lateral angiograms of the head were obtained. FINDINGS: Subocclusive filling defects are noted within the torcular, right transverse and sigmoid sinuses. No opacification of the straight sinus seen with bilateral anterior and posterior  circulation injection. The left internal cerebral vein appears to drain anteriorly. Delayed opacification of the right internal cerebral vein seen without clear drainage route. The left transverse and sigmoid sinuses at faint opacification on the right vertebral artery injection and appear hypoplastic. There is brisk contrast opacification of the bilateral ACA, MCA and PCA vascular trees. Luminal caliber is smooth and tapering. No high-flow, early draining vein. No aneurysm, AVM or dural AV fistula identified. No areas of abnormal hypervascularity. PROCEDURE: The diagnostic catheter was maintained in the right internal carotid artery for control angiograms and connected to a saline flush containing argatroban given concern for possible heparin-induced thrombocytopenia. The right arm was prepped and draped in a sterile fashion. Real-time ultrasound guidance was utilized for vascular access including the acquisition of a permanent ultrasound image documenting patency of the accessed vessel. Using the modified Seldinger  technique and a micropuncture kit, access was gained to the right cephalic vein at the arm and a 5 French sheath was placed. Then, using fluoroscopy guidance, a 5 [place] vert catheter was navigated over a 0.035 Terumo Glidewire into the right subclavian vein and then navigated into the right jugular vein. Right ICA angiograms were obtained utilizing the arterial access in right anterior oblique and lateral views of the head. Using biplane fluoroscopy a 0.035 inch exchange length Glidewire the diagnostic catheter and 5 French sheath were exchanged for an infinity long sheath which was placed in the jugular bulb. Under biplane roadmap, a large bore aspiration catheter was navigated over a phenom 21 microcatheter and a synchro support microguidewire into the right transverse sinus. The microcatheter was then navigated over the wire into the straight sinus. Then, a 6 mm Trevo stent retriever was deployed spanning the straight sinus. The device was allowed to intercalated with the clot for 4 minutes. The microcatheter was removed. The aspiration catheter was advanced to the level of occlusion and connected to a penumbra aspiration pump. The thrombectomy device and aspiration catheter were removed under constant aspiration. Right ICA angiograms in right anterior oblique and lateral views of the head showed persistent straight sinus occlusion. Under biplane roadmap, a large bore aspiration catheter was navigated over a phenom 21 microcatheter and a synchro support microguidewire into the right transverse sinus. The microcatheter was then navigated over the wire into the straight sinus. Angiograms obtained from microcatheter contrast injection showed large burden of clot within the Vein of Galen. The microcatheter was then advanced into the vein of Galen. Then, a 6 mm Trevo stent retriever was deployed spanning the the vein of Galen and straight sinus. The device was allowed to intercalated with the clot for 4 minutes. The  microcatheter was removed. The aspiration catheter was advanced to the level of occlusion and connected to a penumbra aspiration pump. The thrombectomy device and aspiration catheter were removed under constant aspiration. Right ICA angiograms in right anterior oblique and lateral views of the head showed delayed Galen and straight sinus opacification. Under biplane roadmap, a large bore aspiration catheter was navigated over a phenom 21 microcatheter and a synchro support microguidewire into the right transverse sinus. The microcatheter was then navigated over the wire into the straight sinus. Then, a 6 mm Trevo stent retriever was deployed spanning the straight sinus. The device was allowed to intercalated with the clot for 4 minutes. The microcatheter was removed. The aspiration catheter was advanced to the level of occlusion and connected to a penumbra aspiration pump. The thrombectomy device and aspiration catheter were removed under constant aspiration.  Right ICA angiograms in right anterior oblique and lateral views of the head showed persistent delayed Galen and straight sinus opacification with decreased clot burden. Under biplane roadmap, a large bore aspiration catheter was navigated over a phenom 21 microcatheter and a synchro support microguidewire into the right transverse sinus. The microcatheter was then navigated over the wire into the straight sinus. Then, a 6 mm Trevo stent retriever was deployed spanning the proximal straight sinus, torcula and proximal right transverse sinus. The device was allowed to intercalated with the clot for 4 minutes. The aspiration catheter was advanced to the level of occlusion and connected to a penumbra aspiration pump. The thrombectomy device and aspiration catheter were removed under constant aspiration. Right ICA angiograms in right anterior oblique and lateral views of the head showed decreased clot burden in the torcula. Under biplane roadmap, a large bore  aspiration catheter was navigated over a phenom 21 microcatheter and a synchro support microguidewire into the right transverse sinus. The microcatheter was then navigated over the wire into the sigmoid sinus. Then, a 6 mm Trevo stent retriever was deployed spanning the right sigmoid and transverse sinus. The device was allowed to intercalated with the clot for 4 minutes. The aspiration catheter was advanced to the level of occlusion and connected to a penumbra aspiration pump. The thrombectomy device and aspiration catheter were removed under constant aspiration. Follow-up right ICA angiograms showed delayed opacification of vein of Galen and faint opacification of the straight sinus. The diagnostic at the catheter retracted from the right ACA and advanced into the right vertebral artery. Townes and lateral angiograms of the head were obtained showing anterograde at the CT opacification of the vein of Galen with residual filling defect and retrograde opacification of the straight sinus. The catheter was subsequently withdrawn. Flat panel CT of the head was obtained and post processed in a separate workstation with concurrent attending physician supervision. Selected images were sent to PACS. No evidence of hemorrhagic complication seen. The arterial sheath in the snuff box was removed and inflatable band was placed at the access site for patent hemostasis. Subsequently, the venous sheath was removed and manual pressure was held for 15 minutes with complete hemostasis. IMPRESSION: Mechanical thrombectomy with recanalization of the vein of Galen and straight sinus and decreased clot burden in the torcula, right transverse and sigmoid sinuses. Residual nonocclusive clot is seen in the vein of Galen. PLAN: 1. Continued close monitoring at ICU. 2. Follow-up with neurology for possible anticoagulation once thrombocytopenia is resolved. Electronically Signed   By: Pedro Earls M.D.   On: 02/25/2020 18:14     IR US Guide Vasc Access Right  Result Date: 02/25/2020 INDICATION: 74 year old female with past medical history significant for hypertension, hyperlipidemia and transient ischemic attack who presented to emergency with headache on 02/14/2020. At admission, markedly elevated platelet and red blood cell counts was noted consistent with polycythemia vera. She underwent a head CT that showed a right intraventricular hemorrhage. Follow-up CT of performed on 02/24/2020 showed increased density of the vein of Galen, straight sinus and right transverse and sigmoid sinuses concerning for cerebral venous sinus thrombosis which was confirmed with a CT venography. We were consulted for venous mechanical thrombectomy. EXAM: Ultrasound-guided arterial access Ultrasound-guided venous access Diagnostic cerebral angiogram Mechanical thrombectomy of cerebral venous thrombosis COMPARISON:  CT venogram 02/24/2020 MEDICATIONS: [id number] of nitroglicerin. ANESTHESIA/SEDATION: General anesthesia performed. CONTRAST:  120 mL Omnipaque 240 FLUOROSCOPY TIME:  Fluoroscopy Time: 88 minutes 12 seconds (1,724 mGy). COMPLICATIONS: None  immediate. TECHNIQUE: Informed written consent was obtained from the patient's husband after a thorough discussion of the procedural risks, benefits and alternatives. All questions were addressed. Maximal Sterile Barrier Technique was utilized including caps, mask, sterile gowns, sterile gloves, sterile drape, hand hygiene and skin antiseptic. A timeout was performed prior to the initiation of the procedure. Real-time ultrasound guidance was utilized for vascular access including the acquisition of a permanent ultrasound image documenting patency of the accessed vessel. Using the modified Seldinger technique and a micropuncture kit, access was gained to the distal right radial artery at the anatomical snuffbox and a 5 French sheath was placed. Slow intra arterial infusion of 295 mcg nitroglicerin diluted in  patient's own blood was performed. No significant fluctuation in patient's blood pressure seen. Then, a right radial artery roadmap was obtained via sheath side port. Normal brachial artery branching pattern seen. No significant anatomical variation. The right radial artery caliber is adequate for vascular access. Then, a 5 [place] Simmons 2 glide catheter was navigated over a 0.035 Terumo Glidewire into the right subclavian artery and then into the descending aorta. The catheter tip was reformed and then placed in the left common carotid artery. Frontal and lateral road map of the left ICA were obtained. The catheter was then advanced over the wire into the left ICA. Frontal and lateral angiograms of the head were obtained. The catheter was then retracted and advanced into the right vertebral artery. Townes and lateral views of the head were obtained. The catheter was placed into the left common carotid artery. From bilateral roadmap were obtained. The catheter was navigated into the right internal carotid artery. Frontal and lateral angiograms of the head were obtained. FINDINGS: Subocclusive filling defects are noted within the torcular, right transverse and sigmoid sinuses. No opacification of the straight sinus seen with bilateral anterior and posterior circulation injection. The left internal cerebral vein appears to drain anteriorly. Delayed opacification of the right internal cerebral vein seen without clear drainage route. The left transverse and sigmoid sinuses at faint opacification on the right vertebral artery injection and appear hypoplastic. There is brisk contrast opacification of the bilateral ACA, MCA and PCA vascular trees. Luminal caliber is smooth and tapering. No high-flow, early draining vein. No aneurysm, AVM or dural AV fistula identified. No areas of abnormal hypervascularity. PROCEDURE: The diagnostic catheter was maintained in the right internal carotid artery for control angiograms and  connected to a saline flush containing argatroban given concern for possible heparin-induced thrombocytopenia. The right arm was prepped and draped in a sterile fashion. Real-time ultrasound guidance was utilized for vascular access including the acquisition of a permanent ultrasound image documenting patency of the accessed vessel. Using the modified Seldinger technique and a micropuncture kit, access was gained to the right cephalic vein at the arm and a 5 French sheath was placed. Then, using fluoroscopy guidance, a 5 [place] vert catheter was navigated over a 0.035 Terumo Glidewire into the right subclavian vein and then navigated into the right jugular vein. Right ICA angiograms were obtained utilizing the arterial access in right anterior oblique and lateral views of the head. Using biplane fluoroscopy a 0.035 inch exchange length Glidewire the diagnostic catheter and 5 French sheath were exchanged for an infinity long sheath which was placed in the jugular bulb. Under biplane roadmap, a large bore aspiration catheter was navigated over a phenom 21 microcatheter and a synchro support microguidewire into the right transverse sinus. The microcatheter was then navigated over the wire into the  straight sinus. Then, a 6 mm Trevo stent retriever was deployed spanning the straight sinus. The device was allowed to intercalated with the clot for 4 minutes. The microcatheter was removed. The aspiration catheter was advanced to the level of occlusion and connected to a penumbra aspiration pump. The thrombectomy device and aspiration catheter were removed under constant aspiration. Right ICA angiograms in right anterior oblique and lateral views of the head showed persistent straight sinus occlusion. Under biplane roadmap, a large bore aspiration catheter was navigated over a phenom 21 microcatheter and a synchro support microguidewire into the right transverse sinus. The microcatheter was then navigated over the wire into  the straight sinus. Angiograms obtained from microcatheter contrast injection showed large burden of clot within the Vein of Galen. The microcatheter was then advanced into the vein of Galen. Then, a 6 mm Trevo stent retriever was deployed spanning the the vein of Galen and straight sinus. The device was allowed to intercalated with the clot for 4 minutes. The microcatheter was removed. The aspiration catheter was advanced to the level of occlusion and connected to a penumbra aspiration pump. The thrombectomy device and aspiration catheter were removed under constant aspiration. Right ICA angiograms in right anterior oblique and lateral views of the head showed delayed Galen and straight sinus opacification. Under biplane roadmap, a large bore aspiration catheter was navigated over a phenom 21 microcatheter and a synchro support microguidewire into the right transverse sinus. The microcatheter was then navigated over the wire into the straight sinus. Then, a 6 mm Trevo stent retriever was deployed spanning the straight sinus. The device was allowed to intercalated with the clot for 4 minutes. The microcatheter was removed. The aspiration catheter was advanced to the level of occlusion and connected to a penumbra aspiration pump. The thrombectomy device and aspiration catheter were removed under constant aspiration. Right ICA angiograms in right anterior oblique and lateral views of the head showed persistent delayed Galen and straight sinus opacification with decreased clot burden. Under biplane roadmap, a large bore aspiration catheter was navigated over a phenom 21 microcatheter and a synchro support microguidewire into the right transverse sinus. The microcatheter was then navigated over the wire into the straight sinus. Then, a 6 mm Trevo stent retriever was deployed spanning the proximal straight sinus, torcula and proximal right transverse sinus. The device was allowed to intercalated with the clot for 4  minutes. The aspiration catheter was advanced to the level of occlusion and connected to a penumbra aspiration pump. The thrombectomy device and aspiration catheter were removed under constant aspiration. Right ICA angiograms in right anterior oblique and lateral views of the head showed decreased clot burden in the torcula. Under biplane roadmap, a large bore aspiration catheter was navigated over a phenom 21 microcatheter and a synchro support microguidewire into the right transverse sinus. The microcatheter was then navigated over the wire into the sigmoid sinus. Then, a 6 mm Trevo stent retriever was deployed spanning the right sigmoid and transverse sinus. The device was allowed to intercalated with the clot for 4 minutes. The aspiration catheter was advanced to the level of occlusion and connected to a penumbra aspiration pump. The thrombectomy device and aspiration catheter were removed under constant aspiration. Follow-up right ICA angiograms showed delayed opacification of vein of Galen and faint opacification of the straight sinus. The diagnostic at the catheter retracted from the right ACA and advanced into the right vertebral artery. Townes and lateral angiograms of the head were obtained showing anterograde at  the CT opacification of the vein of Galen with residual filling defect and retrograde opacification of the straight sinus. The catheter was subsequently withdrawn. Flat panel CT of the head was obtained and post processed in a separate workstation with concurrent attending physician supervision. Selected images were sent to PACS. No evidence of hemorrhagic complication seen. The arterial sheath in the snuff box was removed and inflatable band was placed at the access site for patent hemostasis. Subsequently, the venous sheath was removed and manual pressure was held for 15 minutes with complete hemostasis. IMPRESSION: Mechanical thrombectomy with recanalization of the vein of Galen and straight  sinus and decreased clot burden in the torcula, right transverse and sigmoid sinuses. Residual nonocclusive clot is seen in the vein of Galen. PLAN: 1. Continued close monitoring at ICU. 2. Follow-up with neurology for possible anticoagulation once thrombocytopenia is resolved. Electronically Signed   By: Pedro Earls M.D.   On: 02/25/2020 18:14   MR MRV HEAD WO CM  Result Date: 02/25/2020 CLINICAL DATA:  Stroke, follow-up. Additional history obtained from [place] thrombectomy yesterday. EXAM: MR VENOGRAM OF THE HEAD WITHOUT CONTRAST TECHNIQUE: Angiographic images of the intracranial venous structures were obtained using MRV technique without intravenous contrast. COMPARISON:  Concurrently performed brain MRI, CT venogram 02/24/2020, MRI/MRA head 02/14/2020 FINDINGS: There is nonvisualization of the straight sinus and vein of Galen likely reflecting complete occlusion. There is flow related signal within the left internal cerebral vein. The right internal cerebral vein is nonvisualized and may be thrombosed. Nonvisualization of the right basal vein of Rosenthal with prominent SWI signal loss at this site on concurrently performed brain MRI, likely thrombosed. As compared to CTV performed yesterday, there is improved patency of the confluence of sinuses. The superior sagittal sinus is patent without filling defect. There is persistent fairly extensive subocclusive thrombus within the right transverse and sigmoid dural venous sinuses. Poor delineation of the left transverse sinus and portions of the left sigmoid sinus likely reflecting persistent thrombus within these vessels. IMPRESSION: 1. Nonvisualization of the straight sinus and vein of Galen likely reflecting occlusive thrombosis. 2. Nonvisualization of the right internal cerebral vein and right basal vein of Rosenthal, likely occluded. 3. Persistent and fairly extensive multifocal subocclusive thrombus within the  right transverse and sigmoid sinuses. 4. Poor delineation of the left transverse sinus and of portions of the left sigmoid sinus consistent likely reflecting persistent thrombus within these vessels. 5. Improved patency of the confluence of sinuses 6. Patent superior sagittal sinus. Electronically Signed   By: Kellie Simmering DO   On: 02/25/2020 14:08   CT VENOGRAM HEAD  Result Date: 02/24/2020 CLINICAL DATA:  Stroke, follow-up. EXAM: CT VENOGRAM HEAD TECHNIQUE: Postcontrast CT venography of the head was performed. Coronal and sagittal thin reconstructions were also submitted for evaluation. Additionally, axial, coronal and sagittal thick MIP reconstructions were submitted for evaluation. CONTRAST:  106m OMNIPAQUE IOHEXOL 300 MG/ML  SOLN COMPARISON:  Noncontrast head CT performed earlier the same day 02/24/2020. FINDINGS: There is confirmed extensive thrombus within the confluence of sinuses and straight sinus also extending into the vein of Galen. There is also fairly extensive multifocal thrombus within the right greater than left transverse and sigmoid dural venous sinuses. Some enhancement is seen within the internal cerebral veins and proximal basal veins of Rosenthal. No definite thrombus is identified within the superior or inferior sagittal sinuses. Enhancement is seen within the visualized upper jugular veins bilaterally. IMPRESSION: Confirmed extensive thrombus within the confluence of  sinuses and straight sinus, also extending into the vein of Galen. There is also fairly extensive multifocal thrombus within the right greater than left transverse and sigmoid dural venous sinuses. Electronically Signed   By: Kellie Simmering DO   On: 02/24/2020 16:37   IR ANGIO INTRA EXTRACRAN SEL INTERNAL CAROTID BILAT MOD SED  Result Date: 02/25/2020 INDICATION: 74 year old female with past medical history significant for hypertension, hyperlipidemia and transient ischemic attack who presented to emergency with headache  on 02/14/2020. At admission, markedly elevated platelet and red blood cell counts was noted consistent with polycythemia vera. She underwent a head CT that showed a right intraventricular hemorrhage. Follow-up CT of performed on 02/24/2020 showed increased density of the vein of Galen, straight sinus and right transverse and sigmoid sinuses concerning for cerebral venous sinus thrombosis which was confirmed with a CT venography. We were consulted for venous mechanical thrombectomy. EXAM: Ultrasound-guided arterial access Ultrasound-guided venous access Diagnostic cerebral angiogram Mechanical thrombectomy of cerebral venous thrombosis COMPARISON:  CT venogram 02/24/2020 MEDICATIONS: [id number] of nitroglicerin. ANESTHESIA/SEDATION: General anesthesia performed. CONTRAST:  120 mL Omnipaque 240 FLUOROSCOPY TIME:  Fluoroscopy Time: 88 minutes 12 seconds (1,724 mGy). COMPLICATIONS: None immediate. TECHNIQUE: Informed written consent was obtained from the patient's husband after a thorough discussion of the procedural risks, benefits and alternatives. All questions were addressed. Maximal Sterile Barrier Technique was utilized including caps, mask, sterile gowns, sterile gloves, sterile drape, hand hygiene and skin antiseptic. A timeout was performed prior to the initiation of the procedure. Real-time ultrasound guidance was utilized for vascular access including the acquisition of a permanent ultrasound image documenting patency of the accessed vessel. Using the modified Seldinger technique and a micropuncture kit, access was gained to the distal right radial artery at the anatomical snuffbox and a 5 French sheath was placed. Slow intra arterial infusion of 732 mcg nitroglicerin diluted in patient's own blood was performed. No significant fluctuation in patient's blood pressure seen. Then, a right radial artery roadmap was obtained via sheath side port. Normal brachial artery branching pattern seen. No significant  anatomical variation. The right radial artery caliber is adequate for vascular access. Then, a 5 [place] Simmons 2 glide catheter was navigated over a 0.035 Terumo Glidewire into the right subclavian artery and then into the descending aorta. The catheter tip was reformed and then placed in the left common carotid artery. Frontal and lateral road map of the left ICA were obtained. The catheter was then advanced over the wire into the left ICA. Frontal and lateral angiograms of the head were obtained. The catheter was then retracted and advanced into the right vertebral artery. Townes and lateral views of the head were obtained. The catheter was placed into the left common carotid artery. From bilateral roadmap were obtained. The catheter was navigated into the right internal carotid artery. Frontal and lateral angiograms of the head were obtained. FINDINGS: Subocclusive filling defects are noted within the torcular, right transverse and sigmoid sinuses. No opacification of the straight sinus seen with bilateral anterior and posterior circulation injection. The left internal cerebral vein appears to drain anteriorly. Delayed opacification of the right internal cerebral vein seen without clear drainage route. The left transverse and sigmoid sinuses at faint opacification on the right vertebral artery injection and appear hypoplastic. There is brisk contrast opacification of the bilateral ACA, MCA and PCA vascular trees. Luminal caliber is smooth and tapering. No high-flow, early draining vein. No aneurysm, AVM or dural AV fistula identified. No areas of abnormal hypervascularity. PROCEDURE: The  diagnostic catheter was maintained in the right internal carotid artery for control angiograms and connected to a saline flush containing argatroban given concern for possible heparin-induced thrombocytopenia. The right arm was prepped and draped in a sterile fashion. Real-time ultrasound guidance was utilized for vascular  access including the acquisition of a permanent ultrasound image documenting patency of the accessed vessel. Using the modified Seldinger technique and a micropuncture kit, access was gained to the right cephalic vein at the arm and a 5 French sheath was placed. Then, using fluoroscopy guidance, a 5 [place] vert catheter was navigated over a 0.035 Terumo Glidewire into the right subclavian vein and then navigated into the right jugular vein. Right ICA angiograms were obtained utilizing the arterial access in right anterior oblique and lateral views of the head. Using biplane fluoroscopy a 0.035 inch exchange length Glidewire the diagnostic catheter and 5 French sheath were exchanged for an infinity long sheath which was placed in the jugular bulb. Under biplane roadmap, a large bore aspiration catheter was navigated over a phenom 21 microcatheter and a synchro support microguidewire into the right transverse sinus. The microcatheter was then navigated over the wire into the straight sinus. Then, a 6 mm Trevo stent retriever was deployed spanning the straight sinus. The device was allowed to intercalated with the clot for 4 minutes. The microcatheter was removed. The aspiration catheter was advanced to the level of occlusion and connected to a penumbra aspiration pump. The thrombectomy device and aspiration catheter were removed under constant aspiration. Right ICA angiograms in right anterior oblique and lateral views of the head showed persistent straight sinus occlusion. Under biplane roadmap, a large bore aspiration catheter was navigated over a phenom 21 microcatheter and a synchro support microguidewire into the right transverse sinus. The microcatheter was then navigated over the wire into the straight sinus. Angiograms obtained from microcatheter contrast injection showed large burden of clot within the Vein of Galen. The microcatheter was then advanced into the vein of Galen. Then, a 6 mm Trevo stent  retriever was deployed spanning the the vein of Galen and straight sinus. The device was allowed to intercalated with the clot for 4 minutes. The microcatheter was removed. The aspiration catheter was advanced to the level of occlusion and connected to a penumbra aspiration pump. The thrombectomy device and aspiration catheter were removed under constant aspiration. Right ICA angiograms in right anterior oblique and lateral views of the head showed delayed Galen and straight sinus opacification. Under biplane roadmap, a large bore aspiration catheter was navigated over a phenom 21 microcatheter and a synchro support microguidewire into the right transverse sinus. The microcatheter was then navigated over the wire into the straight sinus. Then, a 6 mm Trevo stent retriever was deployed spanning the straight sinus. The device was allowed to intercalated with the clot for 4 minutes. The microcatheter was removed. The aspiration catheter was advanced to the level of occlusion and connected to a penumbra aspiration pump. The thrombectomy device and aspiration catheter were removed under constant aspiration. Right ICA angiograms in right anterior oblique and lateral views of the head showed persistent delayed Galen and straight sinus opacification with decreased clot burden. Under biplane roadmap, a large bore aspiration catheter was navigated over a phenom 21 microcatheter and a synchro support microguidewire into the right transverse sinus. The microcatheter was then navigated over the wire into the straight sinus. Then, a 6 mm Trevo stent retriever was deployed spanning the proximal straight sinus, torcula and proximal right transverse sinus.  The device was allowed to intercalated with the clot for 4 minutes. The aspiration catheter was advanced to the level of occlusion and connected to a penumbra aspiration pump. The thrombectomy device and aspiration catheter were removed under constant aspiration. Right ICA  angiograms in right anterior oblique and lateral views of the head showed decreased clot burden in the torcula. Under biplane roadmap, a large bore aspiration catheter was navigated over a phenom 21 microcatheter and a synchro support microguidewire into the right transverse sinus. The microcatheter was then navigated over the wire into the sigmoid sinus. Then, a 6 mm Trevo stent retriever was deployed spanning the right sigmoid and transverse sinus. The device was allowed to intercalated with the clot for 4 minutes. The aspiration catheter was advanced to the level of occlusion and connected to a penumbra aspiration pump. The thrombectomy device and aspiration catheter were removed under constant aspiration. Follow-up right ICA angiograms showed delayed opacification of vein of Galen and faint opacification of the straight sinus. The diagnostic at the catheter retracted from the right ACA and advanced into the right vertebral artery. Townes and lateral angiograms of the head were obtained showing anterograde at the CT opacification of the vein of Galen with residual filling defect and retrograde opacification of the straight sinus. The catheter was subsequently withdrawn. Flat panel CT of the head was obtained and post processed in a separate workstation with concurrent attending physician supervision. Selected images were sent to PACS. No evidence of hemorrhagic complication seen. The arterial sheath in the snuff box was removed and inflatable band was placed at the access site for patent hemostasis. Subsequently, the venous sheath was removed and manual pressure was held for 15 minutes with complete hemostasis. IMPRESSION: Mechanical thrombectomy with recanalization of the vein of Galen and straight sinus and decreased clot burden in the torcula, right transverse and sigmoid sinuses. Residual nonocclusive clot is seen in the vein of Galen. PLAN: 1. Continued close monitoring at ICU. 2. Follow-up with neurology  for possible anticoagulation once thrombocytopenia is resolved. Electronically Signed   By: Pedro Earls M.D.   On: 02/25/2020 18:14   IR ANGIO VERTEBRAL SEL VERTEBRAL UNI R MOD SED  Result Date: 02/25/2020 INDICATION: 74 year old female with past medical history significant for hypertension, hyperlipidemia and transient ischemic attack who presented to emergency with headache on 02/14/2020. At admission, markedly elevated platelet and red blood cell counts was noted consistent with polycythemia vera. She underwent a head CT that showed a right intraventricular hemorrhage. Follow-up CT of performed on 02/24/2020 showed increased density of the vein of Galen, straight sinus and right transverse and sigmoid sinuses concerning for cerebral venous sinus thrombosis which was confirmed with a CT venography. We were consulted for venous mechanical thrombectomy. EXAM: Ultrasound-guided arterial access Ultrasound-guided venous access Diagnostic cerebral angiogram Mechanical thrombectomy of cerebral venous thrombosis COMPARISON:  CT venogram 02/24/2020 MEDICATIONS: [id number] of nitroglicerin. ANESTHESIA/SEDATION: General anesthesia performed. CONTRAST:  120 mL Omnipaque 240 FLUOROSCOPY TIME:  Fluoroscopy Time: 88 minutes 12 seconds (1,724 mGy). COMPLICATIONS: None immediate. TECHNIQUE: Informed written consent was obtained from the patient's husband after a thorough discussion of the procedural risks, benefits and alternatives. All questions were addressed. Maximal Sterile Barrier Technique was utilized including caps, mask, sterile gowns, sterile gloves, sterile drape, hand hygiene and skin antiseptic. A timeout was performed prior to the initiation of the procedure. Real-time ultrasound guidance was utilized for vascular access including the acquisition of a permanent ultrasound image documenting patency of the accessed vessel. Using  the modified Seldinger technique and a micropuncture kit, access was  gained to the distal right radial artery at the anatomical snuffbox and a 5 French sheath was placed. Slow intra arterial infusion of 376 mcg nitroglicerin diluted in patient's own blood was performed. No significant fluctuation in patient's blood pressure seen. Then, a right radial artery roadmap was obtained via sheath side port. Normal brachial artery branching pattern seen. No significant anatomical variation. The right radial artery caliber is adequate for vascular access. Then, a 5 [place] Simmons 2 glide catheter was navigated over a 0.035 Terumo Glidewire into the right subclavian artery and then into the descending aorta. The catheter tip was reformed and then placed in the left common carotid artery. Frontal and lateral road map of the left ICA were obtained. The catheter was then advanced over the wire into the left ICA. Frontal and lateral angiograms of the head were obtained. The catheter was then retracted and advanced into the right vertebral artery. Townes and lateral views of the head were obtained. The catheter was placed into the left common carotid artery. From bilateral roadmap were obtained. The catheter was navigated into the right internal carotid artery. Frontal and lateral angiograms of the head were obtained. FINDINGS: Subocclusive filling defects are noted within the torcular, right transverse and sigmoid sinuses. No opacification of the straight sinus seen with bilateral anterior and posterior circulation injection. The left internal cerebral vein appears to drain anteriorly. Delayed opacification of the right internal cerebral vein seen without clear drainage route. The left transverse and sigmoid sinuses at faint opacification on the right vertebral artery injection and appear hypoplastic. There is brisk contrast opacification of the bilateral ACA, MCA and PCA vascular trees. Luminal caliber is smooth and tapering. No high-flow, early draining vein. No aneurysm, AVM or dural AV fistula  identified. No areas of abnormal hypervascularity. PROCEDURE: The diagnostic catheter was maintained in the right internal carotid artery for control angiograms and connected to a saline flush containing argatroban given concern for possible heparin-induced thrombocytopenia. The right arm was prepped and draped in a sterile fashion. Real-time ultrasound guidance was utilized for vascular access including the acquisition of a permanent ultrasound image documenting patency of the accessed vessel. Using the modified Seldinger technique and a micropuncture kit, access was gained to the right cephalic vein at the arm and a 5 French sheath was placed. Then, using fluoroscopy guidance, a 5 [place] vert catheter was navigated over a 0.035 Terumo Glidewire into the right subclavian vein and then navigated into the right jugular vein. Right ICA angiograms were obtained utilizing the arterial access in right anterior oblique and lateral views of the head. Using biplane fluoroscopy a 0.035 inch exchange length Glidewire the diagnostic catheter and 5 French sheath were exchanged for an infinity long sheath which was placed in the jugular bulb. Under biplane roadmap, a large bore aspiration catheter was navigated over a phenom 21 microcatheter and a synchro support microguidewire into the right transverse sinus. The microcatheter was then navigated over the wire into the straight sinus. Then, a 6 mm Trevo stent retriever was deployed spanning the straight sinus. The device was allowed to intercalated with the clot for 4 minutes. The microcatheter was removed. The aspiration catheter was advanced to the level of occlusion and connected to a penumbra aspiration pump. The thrombectomy device and aspiration catheter were removed under constant aspiration. Right ICA angiograms in right anterior oblique and lateral views of the head showed persistent straight sinus occlusion. Under biplane roadmap,  a large bore aspiration catheter was  navigated over a phenom 21 microcatheter and a synchro support microguidewire into the right transverse sinus. The microcatheter was then navigated over the wire into the straight sinus. Angiograms obtained from microcatheter contrast injection showed large burden of clot within the Vein of Galen. The microcatheter was then advanced into the vein of Galen. Then, a 6 mm Trevo stent retriever was deployed spanning the the vein of Galen and straight sinus. The device was allowed to intercalated with the clot for 4 minutes. The microcatheter was removed. The aspiration catheter was advanced to the level of occlusion and connected to a penumbra aspiration pump. The thrombectomy device and aspiration catheter were removed under constant aspiration. Right ICA angiograms in right anterior oblique and lateral views of the head showed delayed Galen and straight sinus opacification. Under biplane roadmap, a large bore aspiration catheter was navigated over a phenom 21 microcatheter and a synchro support microguidewire into the right transverse sinus. The microcatheter was then navigated over the wire into the straight sinus. Then, a 6 mm Trevo stent retriever was deployed spanning the straight sinus. The device was allowed to intercalated with the clot for 4 minutes. The microcatheter was removed. The aspiration catheter was advanced to the level of occlusion and connected to a penumbra aspiration pump. The thrombectomy device and aspiration catheter were removed under constant aspiration. Right ICA angiograms in right anterior oblique and lateral views of the head showed persistent delayed Galen and straight sinus opacification with decreased clot burden. Under biplane roadmap, a large bore aspiration catheter was navigated over a phenom 21 microcatheter and a synchro support microguidewire into the right transverse sinus. The microcatheter was then navigated over the wire into the straight sinus. Then, a 6 mm Trevo stent  retriever was deployed spanning the proximal straight sinus, torcula and proximal right transverse sinus. The device was allowed to intercalated with the clot for 4 minutes. The aspiration catheter was advanced to the level of occlusion and connected to a penumbra aspiration pump. The thrombectomy device and aspiration catheter were removed under constant aspiration. Right ICA angiograms in right anterior oblique and lateral views of the head showed decreased clot burden in the torcula. Under biplane roadmap, a large bore aspiration catheter was navigated over a phenom 21 microcatheter and a synchro support microguidewire into the right transverse sinus. The microcatheter was then navigated over the wire into the sigmoid sinus. Then, a 6 mm Trevo stent retriever was deployed spanning the right sigmoid and transverse sinus. The device was allowed to intercalated with the clot for 4 minutes. The aspiration catheter was advanced to the level of occlusion and connected to a penumbra aspiration pump. The thrombectomy device and aspiration catheter were removed under constant aspiration. Follow-up right ICA angiograms showed delayed opacification of vein of Galen and faint opacification of the straight sinus. The diagnostic at the catheter retracted from the right ACA and advanced into the right vertebral artery. Townes and lateral angiograms of the head were obtained showing anterograde at the CT opacification of the vein of Galen with residual filling defect and retrograde opacification of the straight sinus. The catheter was subsequently withdrawn. Flat panel CT of the head was obtained and post processed in a separate workstation with concurrent attending physician supervision. Selected images were sent to PACS. No evidence of hemorrhagic complication seen. The arterial sheath in the snuff box was removed and inflatable band was placed at the access site for patent hemostasis. Subsequently, the venous  sheath was  removed and manual pressure was held for 15 minutes with complete hemostasis. IMPRESSION: Mechanical thrombectomy with recanalization of the vein of Galen and straight sinus and decreased clot burden in the torcula, right transverse and sigmoid sinuses. Residual nonocclusive clot is seen in the vein of Galen. PLAN: 1. Continued close monitoring at ICU. 2. Follow-up with neurology for possible anticoagulation once thrombocytopenia is resolved. Electronically Signed   By: Pedro Earls M.D.   On: 02/25/2020 18:14    Cardiac Studies   2D echocardiogram (02/14/2020)  IMPRESSIONS    1. Left ventricular ejection fraction, by estimation, is 65 to 70%. The  left ventricle has normal function. The left ventricle has no regional  wall motion abnormalities. Left ventricular diastolic parameters were  normal.  2. Right ventricular systolic function is normal. The right ventricular  size is normal. Tricuspid regurgitation signal is inadequate for assessing  PA pressure.  3. The mitral valve is grossly normal. No evidence of mitral valve  regurgitation. No evidence of mitral stenosis.  4. The aortic valve is tricuspid. Aortic valve regurgitation is not  visualized. Mild aortic valve sclerosis is present, with no evidence of  aortic valve stenosis.  5. The inferior vena cava is normal in size with greater than 50%  respiratory variability, suggesting right atrial pressure of 3 mmHg.   Conclusion(s)/Recommendation(s): No intracardiac source of embolism  detected on this transthoracic study. A transesophageal echocardiogram is  recommended to exclude cardiac source of embolism if clinically indicated.   Patient Profile     74 y.o. female with a hx of hypertension, hyperlipidemia history of cardiac issues or cardiac evaluation,whowas admitted 02/14/2020 intraventricular hemorrhage in the setting of severe thrombocytosis, platelet count initially 990,000, hemoglobin initially 17.7,  who we saw because of A. fib with RVR.  She converted with IV amiodarone and it was transitioned to p.o. amiodarone maintaining sinus rhythm.  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  Continue current therapy.     ATRIAL FIB:  NSR.    No change in therapy.  We will follow as needed.    HTN:  BP mildly elevated but labile.  No change in therapy.    HYPOKALEMIA:  Nursing does not think she should take PO so will supplement with IV KCL.    THROMBOSED SIGMOID SINUS/PE/INTRAVENTRICULAR BLEED:  On argatroban per with the help of Dr. Jana Hakim.  She had thrombectomy of the sinus.      For questions or updates, please contact [place] Please consult [email or web address] for contact info under Cardiology/STEMI.   Signed, Minus Breeding, MD  02/26/2020, 8:39 AM

## 2020-02-26 NOTE — Progress Notes (Addendum)
ANTICOAGULATION CONSULT NOTE  Pharmacy Consult:  Argatroban Indication: HIT  Allergies  Allergen Reactions  . Erythromycin Itching and Other (See Comments)    Severe stomach pains, diarrhea  . Heparin     Likely HIT    Patient Measurements: Height: 5\' 2"  (157.5 cm) Weight: 69.9 kg (154 lb 1.6 oz) IBW/kg (Calculated) : 50.1  Vital Signs: Temp: 97.6 F (36.4 C) (04/16 1558) BP: 128/62 (04/16 1930) Pulse Rate: 68 (04/16 1930)  Labs: Recent Labs    02/24/20 0920 02/24/20 1144 02/24/20 1450 02/24/20 1450 02/25/20 0218 02/25/20 1853 02/25/20 2055 02/25/20 2330  HGB  --  14.1  --   --  11.6*  --   --   --   HCT  --  41.4  --   --  35.0*  --   --   --   PLT  --  24* 25*  26*  --  23*  --   --   --   APTT  --   --  42*   < >  --  58* 67* 70*  LABPROT  --   --  15.6*  --   --   --   --   --   INR  --   --  1.3*  --   --   --   --   --   CREATININE 0.73  --   --   --  0.80  --   --   --    < > = values in this interval not displayed.    Estimated Creatinine Clearance: 57.3 mL/min (by C-G formula based on SCr of 0.8 mg/dL).  Assessment: 74 YOF initially admitted 4/4 with acute IVH and thrombocythemia with PLTC of 990k. Patient was ultimately diagnosed with polythemia, now s/p leukophoresis, therapeutic phlebotomy and initiation of hydroxyurea.   4/13 - found to have B/L PE - decision was risk of AC was too great and IVC placed  4/15 CT scan showed extensive thrombus within confluence of sinuses and sent to IR with mechanical thrombectomy x 5.  Of note, plt on 4/4 990>>4/13 160>>4/15 26 and now 23 today DIC panel sent with DDimer > 20, fibrinogen 203 Hep induced antibody is positive at 2.553 (4T score high at 6 - 8 - unknown if we can rule out other causes of clot, but still very high)  Patient received 3 doses of heparin during pharesis and no other heparin documented.   Discussed with Dr Shelba Flake to initiate argatroban to treat HIT associated clots (since risk of  further clotting may be worse than risk of re-bleed), though we will have to be careful and target the low end of therapeutic range due to recent IVH  Normally, once therapeutic we would do daily aPTT - in this case, may need to stick with standard q12h aPTT with therapeutic levels to ensure we do not inadvertently overshoot and get higher than desired levels.  APTT is elevated post rate reduction.  Bruising is stable, no bleeding reported and labs obtained appropriately.  Goal of Therapy:  aPTT 50 - 60 seconds Monitor platelets by anticoagulation protocol: Yes   Plan:  Reduce argatroban by 20%, new rate will be 0.36 mcg/kg/min Check aPTT in 2 hrs  Jerry Haugen D. Mina Marble, PharmD, BCPS, BCCCP 02/26/2020, 12:13 AM  ===============================  Addendum: APTT is acceptable at 62sec; no bleeding reported Adjust argatroban slightly to 0.32 mcg/kg/min Recheck aPTT.  If therapeutic, then reduce to twice daily aPTT.  Detria Cummings D.  Mina Marble, PharmD, BCPS, [place] 02/26/2020, 4:19 AM

## 2020-02-26 NOTE — Progress Notes (Addendum)
[place] for argatroban Indication: HIT  Allergies  Allergen Reactions  . Erythromycin Itching and Other (See Comments)    Severe stomach pains, diarrhea  . Heparin     Likely HIT    Patient Measurements: Height: 5\' 2"  (157.5 cm) Weight: 71.4 kg (157 lb 6.5 oz) IBW/kg (Calculated) : 50.1  Vital Signs: Temp: 96.7 F (35.9 C) (04/17 1618) Temp Source: Axillary (04/17 1618) BP: 129/73 (04/17 1700) Pulse Rate: 78 (04/17 1600)  Labs: Recent Labs    02/24/20 0920 02/24/20 1144 02/24/20 1450 02/24/20 1450 02/25/20 0218 02/25/20 1853 02/26/20 0259 02/26/20 0818 02/26/20 1549  HGB  --    < >  --    < > 11.6*  --  10.6* 10.6*  --   HCT  --    < >  --    < > 35.0*  --  32.5* 32.4*  --   PLT  --    < > 25*  26*   < > 23*  --  38* 41*  --   APTT  --   --  42*  --   --    < > 62* 61* 58*  LABPROT  --   --  15.6*  --   --   --   --   --   --   INR  --   --  1.3*  --   --   --   --   --   --   CREATININE 0.73  --   --   --  0.80  --  0.80  --   --    < > = values in this interval not displayed.    Estimated Creatinine Clearance: 57.9 mL/min (by C-G formula based on SCr of 0.8 mg/dL).  Assessment: 7 f initially admitted 4/4 with acute IVH and thrombocythemia with PLTC of 990k. Patient was ultimately diagnosed with polythemia, now s/p leukophoresis, therapeutic phlebotomy, and initiation of hydroxyurea. On 4/13 - found to have B/L PE - decision was risk of AC was too great and IVC placed. CT scan 4/15 showed extensive thrombus within confluence of sinuses and sent to IR with mechanical thrombectomy x5.  Of note, plt on 4/4 990>>4/13 160>>4/15 26 and now 23 4/16. DIC panel sent with DDimer > 20, fibrinogen 203. Heparin antibody OD 2.55 with 4T score ~6-8 (high likelihood). Patient received 3 doses of heparin during pharesis and no other heparin documented.   Discussed with Dr Shelba Flake (oncology) to initiate argatroban to treat HIT  associated clots (since risk of further clotting may be worse than risk of re-bleed), though we will have to be careful and target the low end of therapeutic range due to recent IVH. Normally, once therapeutic we would do daily aPTT - in this case, may need to stick with standard q12h aPTT with therapeutic levels to ensure we do not inadvertently overshoot and get higher than desired levels  Repeat aPTT this afternoon now within goal at 58 seconds. No bleeding issues noted.   Goal of Therapy:  aPTT 50 - 60 seconds Monitor platelets by anticoagulation protocol: Yes   Initial Plan:  Continue argatroban to 0.3 mcg/kg/min Recheck aPTT in 4h to confirm [place] PharmD., BCPS Clinical Pharmacist 02/26/2020 5:16 PM   Follow up aptt now above goal at 65s. Will reduce argatroban to 0.52mcg/kgmin and follow up aptt with am labs. No bleeding issues noted.   02/26/2020 8:51 PM

## 2020-02-26 NOTE — Progress Notes (Signed)
STROKE TEAM PROGRESS NOTE   INTERVAL HISTORY Patient husband and son are at the bedside. Pt is very sleepy and drowsy, but able to say "I am old" "leave me alone" "stop it" but not open eyes, not following commands. Complains of "head hurts", moving BUEs equally. Not cooperative with BLE movement but seems equal strength by observation. Her plat up to 41, but heparin ab is positive. She is on Argatroban now. Due to drowsy and sleepy, not able to take po, will need NG tube. Her K was low at 2.8, will give supplement once NG tube placed. Will also do CT head. I had long discussion with husband and son at bedside, updated pt current condition, treatment plan and potential prognosis, and answered all the questions. They expressed understanding and appreciation.    Vitals:   02/26/20 0530 02/26/20 0545 02/26/20 0600 02/26/20 0805  BP: (!) 150/68  (!) 157/74   Pulse: 66 66 67   Resp:      Temp: 99.1 F (37.3 C)   (!) 97.1 F (36.2 C)  TempSrc: Oral   Axillary  SpO2: 95% 95% 95%   Weight:   71.4 kg   Height:       CBC:  Recent Labs  Lab 02/25/20 0218 02/25/20 0218 02/26/20 0259 02/26/20 0818  WBC 10.6*   < > 11.2* 11.2*  NEUTROABS 9.1*  --  9.0*  --   HGB 11.6*   < > 10.6* 10.6*  HCT 35.0*   < > 32.5* 32.4*  MCV 90.7   < > 92.9 93.1  PLT 23*   < > 38* 41*   < > = values in this interval not displayed.   Basic Metabolic Panel:  Recent Labs  Lab 02/24/20 0920 02/24/20 0920 02/25/20 0218 02/26/20 0259  NA 140   < > 139 139  K 2.3*   < > 3.0* 2.8*  CL 106   < > 110 112*  CO2 21*   < > 18* 19*  GLUCOSE 143*   < > 140* 118*  BUN 6*   < > 8 9  CREATININE 0.73   < > 0.80 0.80  CALCIUM 8.5*   < > 8.3* 8.3*  MG 1.8  --  2.3  --    < > = values in this interval not displayed.   Lipid Panel:     Component Value Date/Time   CHOL 172 02/14/2020 0959   TRIG 46 02/14/2020 0959   HDL 71 02/14/2020 0959   CHOLHDL 2.4 02/14/2020 0959   VLDL 9 02/14/2020 0959   LDLCALC 92 02/14/2020  0959   HgbA1c:  Lab Results  Component Value Date   HGBA1C 6.1 (H) 02/14/2020    IMAGING past 24 hours   CT Head WO Contrast - pending 02/26/20  MR BRAIN WO CONTRAST  Result Date: 02/25/2020 CLINICAL DATA:  Stroke, follow-up. Additional history obtained from [facility] post venous thrombectomy yesterday. EXAM: MRI HEAD WITHOUT CONTRAST TECHNIQUE: Multiplanar, multiecho pulse sequences of the brain and surrounding structures were obtained without intravenous contrast. COMPARISON:  CT venogram 02/24/2020, head CT 02/24/2020, MRI/MRA head 02/14/2019 FINDINGS: Brain: New from prior MRI 02/14/2020 there are numerous scattered tiny acute infarcts within the bilateral cerebral and cerebellar hemispheres. Additionally, there is an acute cortically based infarct within the left temporal occipital lobes which measures 2.3 x 1.3 cm in transaxial dimensions (series 5, image 66). Small foci of diffusion weighted hyperintensity within the callosal splenium with corresponding intermediate signal  on the ADC map, likely reflecting small subacute infarcts. Subacute intraventricular hemorrhage predominantly within the atrium of the right lateral ventricle has not significantly changed in extent. No hydrocephalus. Also new from prior MRI there is parenchymal swelling and cortical/subcortical edema within the left temporal lobe and frontal operculum measuring 5.5 x 1.9 cm (AP x CC). This may be secondary to venous hypertension. No evidence of acute infarct at this site on the current exam. Redemonstrated extensive confluent T2/FLAIR hyperintensity within the cerebral white matter and patchy T2 hyperintense signal changes within the basal ganglia, thalami and pons. Findings are nonspecific but may reflect chronic small vessel ischemic disease. Numerous chronic microhemorrhages again present within the cerebral and cerebellar hemispheres and brainstem. Vascular: Flow voids maintained within the  proximal large arterial vessels. Please refer to MR venography separately reported. Skull and upper cervical spine: No focal marrow lesion. Sinuses/Orbits: Visualized orbits demonstrate no acute abnormality. Right maxillary sinus mucous retention cyst. Trace ethmoid sinus mucosal thickening. No significant mastoid effusion. IMPRESSION: 1. Numerous tiny acute infarcts within the bilateral cerebral and cerebellar hemispheres. Findings are suspicious for an embolic process. 2. 2.3 cm acute cortically based infarct within the right temporal occipital lobes likely reflecting a venous infarct. 3. Small subacute infarcts within the callosal splenium. 4. New region of parenchymal swelling and T2 hyperintensity within the left temporal lobe and frontal operculum which may reflect edema related to venous hypertension. No evidence of acute infarct at this site on the current exam. 5. Unchanged subacute intraventricular hemorrhage predominantly within the atrium of the right lateral ventricle. No hydrocephalus. 6. Redemonstrated extensive T2 hyperintense signal changes within the cerebral white matter, basal ganglia, thalami and pons which is nonspecific, but may reflect sequela of chronic small vessel ischemia. 7. Redemonstrated numerous supratentorial and infratentorial chronic microhemorrhages which may be related to hypertensive microangiopathy or amyloid angiopathy. Electronically Signed   By: Kellie Simmering DO   On: 02/25/2020 13:55   MR MRV HEAD WO CM  Result Date: 02/25/2020 CLINICAL DATA:  Stroke, follow-up. Additional history obtained from [facility] thrombectomy yesterday. EXAM: MR VENOGRAM OF THE HEAD WITHOUT CONTRAST TECHNIQUE: Angiographic images of the intracranial venous structures were obtained using MRV technique without intravenous contrast. COMPARISON:  Concurrently performed brain MRI, CT venogram 02/24/2020, MRI/MRA head 02/14/2020 FINDINGS: There is nonvisualization of the  straight sinus and vein of Galen likely reflecting complete occlusion. There is flow related signal within the left internal cerebral vein. The right internal cerebral vein is nonvisualized and may be thrombosed. Nonvisualization of the right basal vein of Rosenthal with prominent SWI signal loss at this site on concurrently performed brain MRI, likely thrombosed. As compared to CTV performed yesterday, there is improved patency of the confluence of sinuses. The superior sagittal sinus is patent without filling defect. There is persistent fairly extensive subocclusive thrombus within the right transverse and sigmoid dural venous sinuses. Poor delineation of the left transverse sinus and portions of the left sigmoid sinus likely reflecting persistent thrombus within these vessels. IMPRESSION: 1. Nonvisualization of the straight sinus and vein of Galen likely reflecting occlusive thrombosis. 2. Nonvisualization of the right internal cerebral vein and right basal vein of Rosenthal, likely occluded. 3. Persistent and fairly extensive multifocal subocclusive thrombus within the right transverse and sigmoid sinuses. 4. Poor delineation of the left transverse sinus and of portions of the left sigmoid sinus consistent likely reflecting persistent thrombus within these vessels. 5. Improved patency of the confluence of sinuses 6. Patent superior sagittal sinus.  Electronically Signed   By: Kellie Simmering DO   On: 02/25/2020 14:08     PHYSICAL EXAM      General - Well nourished, well developed, lethargy, drowsy and sleepy Ophthalmologic - fundi not visualized due to noncooperation. Respiratory Lungs are clear to auscultation.  No wheezing or rhonchi Cardiovascular - Regular rhythm and rate, not in afib.  Neuro - lethargic, drowsy and sleepy, not open eyes on voice or pain, but able to say simple sentences "I am old" "leave me alone" "stop it" . With eye forced opening, PERRL, eye mid position, facial symmetrical, not  blinking to visual threat bilaterally, resistant of eye forced opening. BUE against gravity, symmetrical. BLE 2/5 withdraw to pain, symmetrical. DTR 1+, no babinski. Sensation, coordination and gait not tested.    ASSESSMENT/PLAN Ms. DLORAH COSSAIRT is a 74 y.o. female with history of HTN presenting with severe persistent HA accompanied by nausea and vomiting. This has now improved.   IVH - right lateral ventricle IVH secondary to ? CAA vs. HTN  CT head 4/5 0032 R lateral IVH. Severe chronic ischemic microangiopathy.  MRI 4/5 Stable IVH, severe leukoaraiosis. Multiple foci throughout progressed from previous MRI d/t microangiopathy vs amyloid.  MRA unremarkable   CT head repeat 4/5-4/10 - unchanged size and configuration of right IVH  EEG - This study is suggestive of moderate diffuse encephalopathy  2D Echo EF 65-70%. No source of embolus   LDL 92   HgbA1c 6.1   DVT prophylaxis - argatroban IV   aspirin 81 mg daily prior to admission, now on argatroban due to HIT  Therapy recommendations:  pending  Disposition:  pending  Pancytosis due to polycythemia vera   WBC 12->24.4->17.9->17.8->13.9->20.8->18.0->19.5->18.4->11.2  Hgb 17.1->18.6->16.2->16.0->15.4->16.1->17.3->13.3->12.9->13.4->10.6  PLT 990->738->691->669->721->654->548->324->160->23->38->41  Dr. Ron Agee on board   Bone marrow bx done, results no leukemia, consistent with polycythemia vera on hydrea  S/p Phlebotomy and pheresis x 3  Erythropoietin level 1.3 (L)  JAK2 positive for V617F mutation  Further work-up as per Dr. Jinny Blossom not as outpatient  HIT syndrome  B/l PE cerebral venous sinus thrombosis s/p IR  4/13 New SOB and tachycardia  CTA chest - BLE PE.   Filter placement on 4/13 as unable to get [facility] d/t IVH  4/15 CT head - straight sinus and vein of Galan CSVT  CTV Confirmed extensive thrombus within the confluence of sinuses and straight sinus, also extending into the vein of Galen. There  is also fairly extensive multifocal thrombus within the right greater than left transverse and sigmoid dural venous sinuses.  S/p mechanical thrombectomy 02/24/20 of occluded deep cerebral veins  MRI 02/25/2020 shows multiple by cerebral and cerebellar infarcts as well as right temporal cortical venous infarct and cytotoxic edema  MRV persistent occlusion of 3 sinus, vein of Galan, right IJ, right basal vein of Rosenthal  CT head 4/17 increase edema in the left greater than right temporal and occipital lobes  PLT 990->738->691->669->721->654->548->324->160->23->38->41  HIT antibody positive  On argatroban IV  Headache, due to IVH and CSVT  Depakote 500 mg nightly discontinued  Pt now has Fioricet one Q8 hrs prn as well as tylenol prn  Solumedrol 500 mg IV daily for 3 days started Saturday  Dilaudid - one time dose 02/19/20 per Dr Leonie Man  NS at 100 cc's / hr started 4/10  Add topamax 25 bid  Start prednisone taper 40-30-20-10-5-5 on 4/13>>  CT head 4/17 increase edema in the left greater than right temporal and occipital lobes  May  consider 3% saline and Diamox.  Will discuss with hematology  Afib RVR, new diagnosis  rate in 160's -> now back to sinus  Diltizem gtt discontinued  On amiodarone  Cardiology on board  Dysphagia  NPO status due to altered mental status  Core track placed under fluoroscopy  Continue p.o. meds  Give potassium supplement  Hypertension  Home meds:  losartan 100, amlodipine 5  On losartan 50 bid and amlodipine 10 and hydralazine 25 tid . SBP goal < 140  Off Cleviprex   BP Stable  . 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 Multiple foci throughout progressed from previous MRI d/t microangiopathy vs amyloid.  As per husband, pt has some anomia as  baseline  BP goal < 140  Hyperlipidemia  Home meds:  lipitor 10  LDL 92  Statin held in setting of acute ICH  Consider continuation of statin at discharge  Hypokalemia and Hypomagnesemia   Potassium 3.3->3.5->2.7->3.8->3.4->3.3->2.3->3.0->2.8 - supplement  Magnesium - 1.6 - 2.2->1.8->2.3  Other Stroke Risk Factors  Advanced age  Former Cigarette smoker  Family hx stroke (mother, father, and multiple other family members)  Other Active Problems  Hepatitis C  Hypothyroid on synthroid. TSH WNL - resume synthroid   Hospital day # 12  This patient is critically ill and at significant risk of neurological worsening, death and care requires constant monitoring of vital signs, hemodynamics,respiratory and cardiac monitoring, extensive review of multiple databases, frequent neurological assessment, discussion with family, other specialists and medical decision making of high complexity. I spent 50 minutes of neurocritical care time  in the care of  this patient. I had long discussion with husband and son at bedside, updated pt current condition, treatment plan and potential prognosis, and answered all the questions.  They expressed understanding and appreciation.   Rosalin Hawking, MD PhD Stroke Neurology 02/26/2020 8:10 PM    To contact Stroke Continuity provider, please refer to [email or web address] After hours, contact General Neurology

## 2020-02-27 ENCOUNTER — Inpatient Hospital Stay: Payer: Self-pay

## 2020-02-27 DIAGNOSIS — G936 Cerebral edema: Secondary | ICD-10-CM

## 2020-02-27 DIAGNOSIS — I4892 Unspecified atrial flutter: Secondary | ICD-10-CM | POA: Diagnosis not present

## 2020-02-27 DIAGNOSIS — R1312 Dysphagia, oropharyngeal phase: Secondary | ICD-10-CM | POA: Diagnosis not present

## 2020-02-27 DIAGNOSIS — I615 Nontraumatic intracerebral hemorrhage, intraventricular: Secondary | ICD-10-CM | POA: Diagnosis not present

## 2020-02-27 DIAGNOSIS — E854 Organ-limited amyloidosis: Secondary | ICD-10-CM | POA: Diagnosis not present

## 2020-02-27 LAB — BASIC METABOLIC PANEL
Anion gap: 9 (ref 5–15)
BUN: 7 mg/dL — ABNORMAL LOW (ref 8–23)
CO2: 18 mmol/L — ABNORMAL LOW (ref 22–32)
Calcium: 8.5 mg/dL — ABNORMAL LOW (ref 8.9–10.3)
Chloride: 110 mmol/L (ref 98–111)
Creatinine, Ser: 0.72 mg/dL (ref 0.44–1.00)
GFR calc Af Amer: >60 mL/min (ref >60)
GFR calc non Af Amer: >60 mL/min (ref >60)
Glucose, Bld: 118 mg/dL — ABNORMAL HIGH (ref 70–99)
Potassium: 3 mmol/L — ABNORMAL LOW (ref 3.5–5.1)
Sodium: 137 mmol/L (ref 135–145)

## 2020-02-27 LAB — CBC
HCT: 32 % — ABNORMAL LOW (ref 36.0–46.0)
Hemoglobin: 10.5 g/dL — ABNORMAL LOW (ref 12.0–15.0)
MCH: 30 pg (ref 26.0–34.0)
MCHC: 32.8 g/dL (ref 30.0–36.0)
MCV: 91.4 fL (ref 80.0–100.0)
Platelets: 51 10*3/uL — ABNORMAL LOW (ref 150–400)
RBC: 3.5 MIL/uL — ABNORMAL LOW (ref 3.87–5.11)
RDW: 16.2 % — ABNORMAL HIGH (ref 11.5–15.5)
WBC: 13 10*3/uL — ABNORMAL HIGH (ref 4.0–10.5)
nRBC: 0 % (ref 0.0–0.2)

## 2020-02-27 LAB — SODIUM
Sodium: 137 mmol/L (ref 135–145)
Sodium: 142 mmol/L (ref 135–145)
Sodium: 155 mmol/L — ABNORMAL HIGH (ref 135–145)

## 2020-02-27 LAB — APTT
aPTT: 59 seconds — ABNORMAL HIGH (ref 24–36)
aPTT: 61 seconds — ABNORMAL HIGH (ref 24–36)

## 2020-02-27 MED ORDER — SODIUM CHLORIDE 0.9% FLUSH
10.0000 mL | Freq: Two times a day (BID) | INTRAVENOUS | Status: DC
  Administered 2020-02-27 (×2): 10 mL
  Administered 2020-02-28: 20 mL
  Administered 2020-02-29: 10 mL
  Administered 2020-02-29 – 2020-03-01 (×2): 20 mL
  Administered 2020-03-01 – 2020-03-02 (×3): 10 mL
  Administered 2020-03-03: 20 mL
  Administered 2020-03-03 – 2020-03-10 (×14): 10 mL

## 2020-02-27 MED ORDER — SODIUM CHLORIDE 0.9% FLUSH: 10.0000 mL | INTRAVENOUS | Status: DC | PRN

## 2020-02-27 MED ORDER — DEXAMETHASONE SODIUM PHOSPHATE 4 MG/ML IJ SOLN
4.0000 mg | Freq: Four times a day (QID) | INTRAMUSCULAR | Status: DC
  Administered 2020-02-27 – 2020-03-05 (×30): 4 mg via INTRAVENOUS
  Filled 2020-02-27 (×30): qty 1

## 2020-02-27 MED ORDER — DEXAMETHASONE SODIUM PHOSPHATE 10 MG/ML IJ SOLN
10.0000 mg | Freq: Once | INTRAMUSCULAR | Status: AC
  Administered 2020-02-27: 10 mg via INTRAVENOUS
  Filled 2020-02-27: qty 1

## 2020-02-27 MED ORDER — SODIUM CHLORIDE 0.9% FLUSH
10.0000 mL | Freq: Two times a day (BID) | INTRAVENOUS | Status: DC
  Administered 2020-02-27 (×2): 10 mL

## 2020-02-27 MED ORDER — CLEVIDIPINE BUTYRATE 0.5 MG/ML IV EMUL
0.0000 mg/h | INTRAVENOUS | Status: DC
  Administered 2020-02-27: 1 mg/h via INTRAVENOUS
  Administered 2020-02-28: 5 mg/h via INTRAVENOUS
  Administered 2020-02-28: 6 mg/h via INTRAVENOUS
  Administered 2020-02-28: 21 mg/h via INTRAVENOUS
  Administered 2020-03-02 (×2): 10 mg/h via INTRAVENOUS
  Administered 2020-03-02: 2 mg/h via INTRAVENOUS
  Administered 2020-03-03: 10 mg/h via INTRAVENOUS
  Administered 2020-03-03: 7 mg/h via INTRAVENOUS
  Administered 2020-03-03: 12:00:00 8 mg/h via INTRAVENOUS
  Administered 2020-03-03: 19:00:00 4 mg/h via INTRAVENOUS
  Administered 2020-03-03: 08:00:00 13 mg/h via INTRAVENOUS
  Filled 2020-02-27: qty 200
  Filled 2020-02-27 (×9): qty 50
  Filled 2020-02-27: qty 200
  Filled 2020-02-27 (×4): qty 50

## 2020-02-27 MED ORDER — LABETALOL HCL 5 MG/ML IV SOLN
20.0000 mg | Freq: Once | INTRAVENOUS | Status: AC
  Administered 2020-02-27: 20 mg via INTRAVENOUS
  Filled 2020-02-27: qty 4

## 2020-02-27 MED ORDER — SODIUM CHLORIDE 3 % IV SOLN
INTRAVENOUS | Status: DC
  Administered 2020-02-27: 75 mL/h via INTRAVENOUS
  Administered 2020-02-28: 50 mL/h via INTRAVENOUS
  Filled 2020-02-27 (×10): qty 500

## 2020-02-27 NOTE — Progress Notes (Signed)
[place] for argatroban Indication: HIT  Allergies  Allergen Reactions  . Erythromycin Itching and Other (See Comments)    Severe stomach pains, diarrhea  . Heparin     Likely HIT    Patient Measurements: Height: 5\' 2"  (157.5 cm) Weight: 72.9 kg (160 lb 11.5 oz) IBW/kg (Calculated) : 50.1  Vital Signs: Temp: 98.3 F (36.8 C) (04/18 1147) Temp Source: Axillary (04/18 1147) BP: 155/64 (04/18 2000) Pulse Rate: 85 (04/18 2000)  Labs: Recent Labs    02/25/20 0218 02/25/20 1853 02/26/20 0259 02/26/20 0259 02/26/20 0818 02/26/20 1549 02/26/20 1945 02/27/20 0221 02/27/20 1846  HGB 11.6*  --  10.6*   < > 10.6*  --   --  10.5*  --   HCT 35.0*  --  32.5*  --  32.4*  --   --  32.0*  --   PLT 23*  --  38*  --  41*  --   --  51*  --   APTT  --    < > 62*   < > 61*   < > 65* 59* 61*  CREATININE 0.80  --  0.80  --   --   --   --  0.72  --    < > = values in this interval not displayed.    Estimated Creatinine Clearance: 58.5 mL/min (by C-G formula based on SCr of 0.72 mg/dL).  Assessment: 89 f initially admitted 4/4 with acute IVH and thrombocythemia with PLTC of 990k. Patient was ultimately diagnosed with polythemia, now s/p leukophoresis, therapeutic phlebotomy, and initiation of hydroxyurea. On 4/13 - found to have B/L PE - decision was risk of AC was too great and IVC placed. CT scan 4/15 showed extensive thrombus within confluence of sinuses and sent to IR with mechanical thrombectomy x5.  Of note, plt on 4/4 990>>4/13 160>>4/15 26 and now 23 4/16. DIC panel sent with DDimer > 20, fibrinogen 203. Heparin antibody OD 2.55 with 4T score ~6-8 (high likelihood). Patient received 3 doses of heparin during pheresis and no other heparin documented.   Discussed with Dr Shelba Flake (oncology) to initiate argatroban to treat HIT associated clots (since risk of further clotting may be worse than risk of re-bleed), though we will have to be careful  and target the low end of therapeutic range due to recent IVH. Normally, once therapeutic we would do daily aPTT - in this case, may need to stick with standard q12h aPTT with therapeutic levels to ensure we do not inadvertently overshoot and get higher than desired levels  APTT just above goal tonight at 61. No bleeding issues noted.   Goal of Therapy:  aPTT 50 - 60 seconds Monitor platelets by anticoagulation protocol: Yes   Initial Plan:  Reduce argatroban at 0.2 mcg/kg/min Will f/u q12h PTT (0500/1700)  Erin Hearing PharmD., BCPS Clinical Pharmacist 02/27/2020 8:35 PM

## 2020-02-27 NOTE — Progress Notes (Addendum)
LUE PICC placed with difficulty.  Pt uncooperative, restless, pulling arms, restrained with soft wrist restraints helped minimally. Restlessness caused the veins to constrict during procedure making it difficult to access the vessels.  Logan RN uncomfortable with requesting med to calm pt.  If pt requires an exchange or needs another PICC, pt will need sedation/antianxiety if possible. Logan RN notified PICC ready for use and to remove PIV's to prevent CLABSI.

## 2020-02-27 NOTE — Progress Notes (Signed)
Peripherally Inserted Central Catheter Placement  The IV Nurse has discussed with the patient and/or persons authorized to consent for the patient, the purpose of this procedure and the potential benefits and risks involved with this procedure.  The benefits include less needle sticks, lab draws from the catheter, and the patient may be discharged home with the catheter. Risks include, but not limited to, infection, bleeding, blood clot (thrombus formation), and puncture of an artery; nerve damage and irregular heartbeat and possibility to perform a PICC exchange if needed/ordered by physician.  Alternatives to this procedure were also discussed.  Bard Power PICC patient education guide, fact sheet on infection prevention and patient information card has been provided to patient /or left at bedside.    PICC Placement Documentation  PICC Double Lumen [id number] PICC Left Basilic 40 cm 2 cm (Active)  Indication for Insertion or Continuance of Line Administration of hyperosmolar/irritating solutions (i.e. TPN, Vancomycin, etc.) 02/27/20 1200  Exposed Catheter (cm) 2 cm 02/27/20 1200  Site Assessment Clean;Dry;Intact 02/27/20 1200  Lumen #1 Status Flushed;Blood return noted;Saline locked 02/27/20 1200  Lumen #2 Status Flushed;Blood return noted;Saline locked 02/27/20 1200  Dressing Type Transparent 02/27/20 1200  Dressing Status Clean;Dry;Intact;Antimicrobial disc in place 02/27/20 [place] checked and tightened 02/27/20 1200  Dressing Change Due 03/05/20 02/27/20 1200       Rolena Infante 02/27/2020, 12:59 PM

## 2020-02-27 NOTE — Progress Notes (Signed)
[place] for argatroban Indication: HIT  Allergies  Allergen Reactions  . Erythromycin Itching and Other (See Comments)    Severe stomach pains, diarrhea  . Heparin     Likely HIT    Patient Measurements: Height: 5\' 2"  (157.5 cm) Weight: 71.4 kg (157 lb 6.5 oz) IBW/kg (Calculated) : 50.1  Vital Signs: Temp: 98.6 F (37 C) (04/17 2300) Temp Source: Axillary (04/17 2300) BP: 159/87 (04/18 0100) Pulse Rate: 80 (04/18 0100)  Labs: Recent Labs    02/24/20 1450 02/24/20 1450 02/25/20 0218 02/25/20 1853 02/26/20 0259 02/26/20 0259 02/26/20 0818 02/26/20 0818 02/26/20 1549 02/26/20 1945 02/27/20 0221  HGB  --    < > 11.6*  --  10.6*   < > 10.6*  --   --   --  10.5*  HCT  --    < > 35.0*  --  32.5*  --  32.4*  --   --   --  32.0*  PLT 25*  26*   < > 23*  --  38*  --  41*  --   --   --  51*  APTT 42*  --   --    < > 62*   < > 61*   < > 58* 65* 59*  LABPROT 15.6*  --   --   --   --   --   --   --   --   --   --   INR 1.3*  --   --   --   --   --   --   --   --   --   --   CREATININE  --   --  0.80  --  0.80  --   --   --   --   --  0.72   < > = values in this interval not displayed.    Estimated Creatinine Clearance: 57.9 mL/min (by C-G formula based on SCr of 0.72 mg/dL).  Assessment: 48 f initially admitted 4/4 with acute IVH and thrombocythemia with PLTC of 990k. Patient was ultimately diagnosed with polythemia, now s/p leukophoresis, therapeutic phlebotomy, and initiation of hydroxyurea. On 4/13 - found to have B/L PE - decision was risk of AC was too great and IVC placed. CT scan 4/15 showed extensive thrombus within confluence of sinuses and sent to IR with mechanical thrombectomy x5.  Of note, plt on 4/4 990>>4/13 160>>4/15 26 and now 23 4/16. DIC panel sent with DDimer > 20, fibrinogen 203. Heparin antibody OD 2.55 with 4T score ~6-8 (high likelihood). Patient received 3 doses of heparin during pheresis and no other heparin  documented.   Discussed with Dr Shelba Flake (oncology) to initiate argatroban to treat HIT associated clots (since risk of further clotting may be worse than risk of re-bleed), though we will have to be careful and target the low end of therapeutic range due to recent IVH. Normally, once therapeutic we would do daily aPTT - in this case, may need to stick with standard q12h aPTT with therapeutic levels to ensure we do not inadvertently overshoot and get higher than desired levels  APTT remains at goal (59 sec). No bleeding issues noted.   Goal of Therapy:  aPTT 50 - 60 seconds Monitor platelets by anticoagulation protocol: Yes   Initial Plan:  Continue argatroban at 0.24 mcg/kg/min Will f/u q12h PTT (0500/1700)  Sherlon Handing, PharmD, BCPS Please see amion for complete clinical pharmacist  phone list 02/27/2020 3:44 AM

## 2020-02-27 NOTE — Progress Notes (Signed)
Spoke with Rolla Plate, RN c/o PICC order. Patient not able to sign consent. Consent to be obtained from husband.

## 2020-02-27 NOTE — Progress Notes (Signed)
Called by RN for Na level of 155, up from 142 six hours ago. The patient is on 3% saline gtt. Given the rapid rate of increase by more than 0.5 meq/L per hour, will hold hypertonic saline. Next Na level in 6 hours.   Electronically signed: Dr. Kerney Elbe

## 2020-02-27 NOTE — Progress Notes (Signed)
0800 Unable to get pt to take po's this morning. Per night shift, pt was alert and able to take pills whole with thin liquids. WCTM and attempt po intake when alert and following commands. MAR held; MD and RPh aware Will hold NGT replacement for tomorrow per MD  0900 3%NaCl initiated Education provided to pt's husband and sons at bedside  1000 Continue to hold po medications 2/2 decreased LOC Family tries to stimulate pt and offers po intake. Education provided on risk of aspiration and possibilities of silent aspiration  1400 Family continues to try to stimulate pt to wake up.  It appears that LOC correlates most with BP control  1700 Pt family educated on visitation policy. Although they are able to all three visit, it has been clarified that they are only to have two visitors in the room at a time. Husband, [name], expresses understanding.

## 2020-02-27 NOTE — Progress Notes (Signed)
STROKE TEAM PROGRESS NOTE   INTERVAL HISTORY Patient husband and son as well as son-in-law are at the bedside. Pt is very sleepy and drowsy, but able to say "leave me alone" "stop it" but not open eyes, not following commands. CT head yesterday showed worsening cerebral edema L>R, but no frank hematoma or venous infarct. Discussed with Dr. Jonette Eva hematology on call, will start 3% saline and decadron for both cytotoxic and vasogenic edema. She did not eat well today, po meds on hold, she pulled off cortrak yesterday, will consider replace tomorrow.    Vitals:   02/27/20 0531 02/27/20 0601 02/27/20 0631 02/27/20 0800  BP: (!) 143/69 132/66 (!) 149/64   Pulse: 68 65 66   Resp:      Temp:    97.7 F (36.5 C)  TempSrc:      SpO2: 97% 95% 95%   Weight:      Height:       CBC:  Recent Labs  Lab 02/25/20 0218 02/25/20 0218 02/26/20 0259 02/26/20 0259 02/26/20 0818 02/27/20 0221  WBC 10.6*   < > 11.2*   < > 11.2* 13.0*  NEUTROABS 9.1*  --  9.0*  --   --   --   HGB 11.6*   < > 10.6*   < > 10.6* 10.5*  HCT 35.0*   < > 32.5*   < > 32.4* 32.0*  MCV 90.7   < > 92.9   < > 93.1 91.4  PLT 23*   < > 38*   < > 41* 51*   < > = values in this interval not displayed.   Basic Metabolic Panel:  Recent Labs  Lab 02/24/20 0920 02/24/20 0920 02/25/20 0218 02/25/20 0218 02/26/20 0259 02/27/20 0221  NA 140   < > 139   < > 139 137  K 2.3*   < > 3.0*   < > 2.8* 3.0*  CL 106   < > 110   < > 112* 110  CO2 21*   < > 18*   < > 19* 18*  GLUCOSE 143*   < > 140*   < > 118* 118*  BUN 6*   < > 8   < > 9 7*  CREATININE 0.73   < > 0.80   < > 0.80 0.72  CALCIUM 8.5*   < > 8.3*   < > 8.3* 8.5*  MG 1.8  --  2.3  --   --   --    < > = values in this interval not displayed.   Lipid Panel:     Component Value Date/Time   CHOL 172 02/14/2020 0959   TRIG 46 02/14/2020 0959   HDL 71 02/14/2020 0959   CHOLHDL 2.4 02/14/2020 0959   VLDL 9 02/14/2020 0959   LDLCALC 92 02/14/2020 0959   HgbA1c:  Lab  Results  Component Value Date   HGBA1C 6.1 (H) 02/14/2020    IMAGING past 24 hours  CT Head WO Contrast  02/26/20 IMPRESSION: 1. Increased edema in the left greater than right temporal and occipital lobes which may reflect worsening edema from venous hypertension and venous infarcts. 2. Unchanged right lateral ventricle hemorrhage. Unchanged ventricular size. 3. Severe chronic small vessel ischemic disease. 4. Multifocal venous thrombosis as previously evaluated by dedicated angiographic imaging.  CT HEAD WO CONTRAST  Result Date: 02/26/2020 CLINICAL DATA:  Follow-up cerebral hemorrhage. EXAM: CT HEAD WITHOUT CONTRAST TECHNIQUE: Contiguous axial images were obtained from the base of  the skull through the vertex without intravenous contrast. COMPARISON:  Head MRI/MRV 02/25/2020 and CT 02/24/2020 FINDINGS: Brain: There is increasing cortical and subcortical edema in the left greater than right temporal and occipital lobes. Subacute hemorrhage in the right lateral ventricle is unchanged, and the ventricles are unchanged in size with mild dilatation of the right temporal horn. No definite new intracranial hemorrhage is identified. There is no extra-axial fluid collection. A background of diffuse hypoattenuation throughout the cerebral white matter bilaterally is nonspecific but compatible with severe chronic small vessel ischemic disease. There are small chronic cerebellar infarcts with unchanged mild edema in the left cerebellar hemisphere. Vascular: Hyperdensity of the internal cerebral veins, vein of Galen, straight sinus, and left transverse sinus corresponding to thrombosis on MRV. Skull: No fracture or suspicious osseous lesion. Sinuses/Orbits: Partially visualized right maxillary sinus mucosal thickening. Clear mastoid air cells. Unremarkable orbits. Other: None. IMPRESSION: 1. Increased edema in the left greater than right temporal and occipital lobes which may reflect worsening edema from venous  hypertension and venous infarcts. 2. Unchanged right lateral ventricle hemorrhage. Unchanged ventricular size. 3. Severe chronic small vessel ischemic disease. 4. Multifocal venous thrombosis as previously evaluated by dedicated angiographic imaging. Electronically Signed   By: Logan Bores M.D.   On: 02/26/2020 12:41   DG Naso G Tube Plc W/Fl W/Rad  Result Date: 02/26/2020 CLINICAL DATA:  History of stroke. Dysphagia. EXAM: NASO G TUBE PLACEMENT WITH FL AND WITH RAD CONTRAST:  None FLUOROSCOPY TIME:  Fluoroscopy Time:  0.2 minutes Number of Acquired Spot Images: 1 COMPARISON:  None. FINDINGS: Nasogastric tube was placed. Fluoroscopic spot image confirmed appropriate positioning in the distal stomach. Nurse tape the tube in place and patient was transported back to the floor. IMPRESSION: Successful nasogastric tube placement with fluoroscopic confirmation of appropriate position in the distal stomach. Electronically Signed   By: Franki Cabot M.D.   On: 02/26/2020 12:54     PHYSICAL EXAM      General - Well nourished, well developed, lethargy, drowsy and sleepy Ophthalmologic - fundi not visualized due to noncooperation. Respiratory - Lungs are clear to auscultation.  No wheezing or rhonchi Cardiovascular - Regular rhythm and rate, not in afib.  Neuro - lethargic, drowsy and sleepy, not open eyes on voice or pain, but able to say simple sentences "leave me alone" "stop it" . With eye forced opening, PERRL, eye mid position, facial symmetrical, not blinking to visual threat bilaterally, resistant of eye forced opening. BUE against gravity, symmetrical. BLE 2-/5 withdraw to pain, symmetrical. DTR 1+, no babinski. Sensation, coordination and gait not tested.   ASSESSMENT/PLAN Ms. CRYSTIANA BARTOLUCCI is a 74 y.o. female with history of HTN presenting with severe persistent HA accompanied by nausea and vomiting. This has now improved.   IVH - right lateral ventricle IVH secondary to ? CAA vs. HTN  CT  head 4/5 0032 R lateral IVH. Severe chronic ischemic microangiopathy.  MRI 4/5 Stable IVH, severe leukoaraiosis. Multiple foci throughout progressed from previous MRI d/t microangiopathy vs amyloid.  MRA unremarkable   CT head repeat 4/5-4/10 - unchanged size and configuration of right IVH  CT Head 02/26/20 - Increased edema in the left greater than right temporal and occipital lobes which may reflect worsening edema from venous hypertension and venous infarcts.   EEG - moderate diffuse encephalopathy  2D Echo EF 65-70%. No source of embolus   LDL 92   HgbA1c 6.1   DVT prophylaxis - argatroban IV   aspirin 81  mg daily prior to admission, now on argatroban due for HIT  Therapy recommendations:  pending  Disposition:  pending  Pancytosis due to polycythemia vera   WBC 12->24.4->17.9->17.8->13.9->19.5->18.4->11.2->13.0 (afebrile)  Hgb 17.1->18.6->16.2->16.1->13.3->12.9->13.4->10.6->10.5   PLT 990->738->691->669->721->654->548->324->160->23->38->41->51 (improving)  Dr. Ron Agee on board   Bone marrow bx done, results no leukemia, consistent with polycythemia vera on hydrea  S/p Phlebotomy and pheresis x 3  Erythropoietin level 1.3 (L)  JAK2 positive for V617F mutation  Further work-up as per Dr. Jinny Blossom not as outpatient  HIT syndrome  B/l PE cerebral venous sinus thrombosis s/p IR  4/13 New SOB and tachycardia  CTA chest - BLE PE.   Filter placement on 4/13 as unable to get [facility] d/t IVH  4/15 CT head - straight sinus and vein of Galan CSVT  CTV Confirmed extensive thrombus within the confluence of sinuses and straight sinus, also extending into the vein of Galen. There is also fairly extensive multifocal thrombus within the right greater than left transverse and sigmoid dural venous sinuses.  S/p mechanical thrombectomy 02/24/20 of occluded deep cerebral veins  MRI 02/25/2020 shows multiple by cerebral and cerebellar infarcts as well as right temporal cortical  venous infarct and cytotoxic edema  MRV persistent occlusion of 3 sinus, vein of Galan, right IJ, right basal vein of Rosenthal  CT head 4/17 increase edema in the left greater than right temporal and occipital lobes  PLT 990->738->691->669->721->654->548->324->160->23->38->41->51  HIT antibody positive  On argatroban IV  Decadron 10mg  x 1 -> 4mg  q6  Hypertonic Saline 3% - started 02/27/20 at 75 cc's per hour.  Na Q 6 hrs - (goal 150 - 155) - 137  Headache, due to IVH and CSVT  Depakote 500 mg nightly discontinued  Pt now has Fioricet one Q8 hrs prn as well as tylenol prn  Solumedrol 500 mg -> prednisone -> off  NS at 100 cc's / hr started 4/10 -> 3% saline  Add topamax 25 bid  Decadron started 10 mg IV x 1 then 4 mg IV Q 6 hrs  CT head 4/17 increase edema in the left greater than right temporal and occipital lobes  May also consider Diamox if not improving, discussed with Dr. Jonette Eva hematology on call.   Afib RVR, new diagnosis  Rate in 160's -> now back to sinus  Diltizem gtt discontinued  On amiodarone  Cardiology signed off 02/26/20  Dysphagia  NPO status due to altered mental status  cortrak placed under fluoro 02/26/20 -> pt pulled off same day  PO med on hold today  cortrak in am if needed  Hypertension  Home meds:  losartan 100, amlodipine 5  On losartan 50 bid and amlodipine 10 and hydralazine 25 tid . SBP goal < 140  Off Cleviprex   BP Stable  . 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 Multiple foci throughout progressed from previous MRI d/t microangiopathy vs amyloid.  As per husband, pt has some anomia as baseline  BP goal < 140  Hyperlipidemia  Home meds:  lipitor 10  LDL 92  Statin held in setting of acute ICH  Consider continuation of statin at  discharge  Hypokalemia and Hypomagnesemia   Potassium 3.0->2.8->3.0 - supplement after PICC line placement  Magnesium - 1.6 - 2.2->1.8->2.3  Other Stroke Risk Factors  Advanced age  Former Cigarette smoker  Family hx  stroke (mother, father, and multiple other family members)  Other Active Problems  Hepatitis C  Hypothyroid on synthroid. TSH WNL - resume synthroid   Hospital day # 13  This patient is critically ill and at significant risk of neurological worsening, death and care requires constant monitoring of vital signs, hemodynamics,respiratory and cardiac monitoring, extensive review of multiple databases, frequent neurological assessment, discussion with family, other specialists and medical decision making of high complexity. I spent 50 minutes of neurocritical care time  in the care of  this patient. I had long discussion with husband and son and son in law at bedside, updated pt current condition, treatment plan and potential prognosis, and answered all the questions.  They expressed understanding and appreciation. I also discussed with Dr. Jonette Eva hematology on call.  Rosalin Hawking, MD PhD Stroke Neurology 02/27/2020 11:23 AM   To contact Stroke Continuity provider, please refer to [email or web address] After hours, contact General Neurology

## 2020-02-27 NOTE — Progress Notes (Signed)
Spoke with Dr. Cheral Marker about sodium rising from 142-155. Will turn off 3% gtt per Dr. Cheral Marker and redraw Na level at 0400.

## 2020-02-28 ENCOUNTER — Encounter (HOSPITAL_COMMUNITY): Payer: Self-pay | Admitting: Neurology

## 2020-02-28 ENCOUNTER — Inpatient Hospital Stay (HOSPITAL_COMMUNITY): Payer: Medicare HMO

## 2020-02-28 DIAGNOSIS — I2690 Septic pulmonary embolism without acute cor pulmonale: Secondary | ICD-10-CM | POA: Diagnosis not present

## 2020-02-28 DIAGNOSIS — I8290 Acute embolism and thrombosis of unspecified vein: Secondary | ICD-10-CM

## 2020-02-28 DIAGNOSIS — I10 Essential (primary) hypertension: Secondary | ICD-10-CM | POA: Diagnosis not present

## 2020-02-28 DIAGNOSIS — E854 Organ-limited amyloidosis: Secondary | ICD-10-CM | POA: Diagnosis not present

## 2020-02-28 DIAGNOSIS — I615 Nontraumatic intracerebral hemorrhage, intraventricular: Secondary | ICD-10-CM | POA: Diagnosis not present

## 2020-02-28 DIAGNOSIS — I4892 Unspecified atrial flutter: Secondary | ICD-10-CM | POA: Diagnosis not present

## 2020-02-28 DIAGNOSIS — Z79899 Other long term (current) drug therapy: Secondary | ICD-10-CM

## 2020-02-28 LAB — BASIC METABOLIC PANEL
Anion gap: 15 (ref 5–15)
BUN: 12 mg/dL (ref 8–23)
CO2: 16 mmol/L — ABNORMAL LOW (ref 22–32)
Calcium: 9.1 mg/dL (ref 8.9–10.3)
Chloride: 116 mmol/L — ABNORMAL HIGH (ref 98–111)
Creatinine, Ser: 0.81 mg/dL (ref 0.44–1.00)
GFR calc Af Amer: >60 mL/min (ref >60)
GFR calc non Af Amer: >60 mL/min (ref >60)
Glucose, Bld: 149 mg/dL — ABNORMAL HIGH (ref 70–99)
Potassium: 3 mmol/L — ABNORMAL LOW (ref 3.5–5.1)
Sodium: 147 mmol/L — ABNORMAL HIGH (ref 135–145)

## 2020-02-28 LAB — APTT
aPTT: 56 seconds — ABNORMAL HIGH (ref 24–36)
aPTT: 46 seconds — ABNORMAL HIGH (ref 24–36)
aPTT: 45 seconds — ABNORMAL HIGH (ref 24–36)

## 2020-02-28 LAB — CBC
HCT: 30.5 % — ABNORMAL LOW (ref 36.0–46.0)
Hemoglobin: 9.8 g/dL — ABNORMAL LOW (ref 12.0–15.0)
MCH: 30.2 pg (ref 26.0–34.0)
MCHC: 32.1 g/dL (ref 30.0–36.0)
MCV: 94.1 fL (ref 80.0–100.0)
Platelets: 100 10*3/uL — ABNORMAL LOW (ref 150–400)
RBC: 3.24 MIL/uL — ABNORMAL LOW (ref 3.87–5.11)
RDW: 16.6 % — ABNORMAL HIGH (ref 11.5–15.5)
WBC: 14 10*3/uL — ABNORMAL HIGH (ref 4.0–10.5)
nRBC: 0 % (ref 0.0–0.2)

## 2020-02-28 LAB — SODIUM
Sodium: 147 mmol/L — ABNORMAL HIGH (ref 135–145)
Sodium: 149 mmol/L — ABNORMAL HIGH (ref 135–145)
Sodium: 153 mmol/L — ABNORMAL HIGH (ref 135–145)

## 2020-02-28 LAB — GLUCOSE, CAPILLARY
Glucose-Capillary: 137 mg/dL — ABNORMAL HIGH (ref 70–99)
Glucose-Capillary: 152 mg/dL — ABNORMAL HIGH (ref 70–99)
Glucose-Capillary: 165 mg/dL — ABNORMAL HIGH (ref 70–99)

## 2020-02-28 LAB — PHOSPHORUS
Phosphorus: 2.6 mg/dL (ref 2.5–4.6)
Phosphorus: 2.4 mg/dL — ABNORMAL LOW (ref 2.5–4.6)

## 2020-02-28 LAB — MAGNESIUM
Magnesium: 2.2 mg/dL (ref 1.7–2.4)
Magnesium: 2.1 mg/dL (ref 1.7–2.4)

## 2020-02-28 MED ORDER — HYDRALAZINE HCL 50 MG PO TABS
50.0000 mg | ORAL_TABLET | Freq: Three times a day (TID) | ORAL | Status: DC
  Administered 2020-02-28 – 2020-02-29 (×4): 50 mg
  Filled 2020-02-28 (×4): qty 1

## 2020-02-28 MED ORDER — VITAL HIGH PROTEIN PO LIQD: 1000.0000 mL | ORAL | Status: DC

## 2020-02-28 MED ORDER — PRO-STAT SUGAR FREE PO LIQD: 30.0000 mL | Freq: Two times a day (BID) | ORAL | Status: DC

## 2020-02-28 MED ORDER — POTASSIUM CHLORIDE 20 MEQ/15ML (10%) PO SOLN
40.0000 meq | ORAL | Status: AC
  Administered 2020-02-28 (×3): 40 meq
  Filled 2020-02-28 (×2): qty 30

## 2020-02-28 MED ORDER — CHLORHEXIDINE GLUCONATE 0.12 % MT SOLN
15.0000 mL | Freq: Two times a day (BID) | OROMUCOSAL | Status: DC
  Administered 2020-02-28 – 2020-03-10 (×22): 15 mL via OROMUCOSAL
  Filled 2020-02-28 (×17): qty 15

## 2020-02-28 MED ORDER — PRO-STAT SUGAR FREE PO LIQD
60.0000 mL | Freq: Two times a day (BID) | ORAL | Status: DC
  Administered 2020-02-28: 60 mL via ORAL
  Filled 2020-02-28 (×2): qty 60

## 2020-02-28 MED ORDER — EPINEPHRINE HCL 5 MG/250ML IV SOLN IN NS: 0.5000 ug/min | INTRAVENOUS | Status: DC

## 2020-02-28 MED ORDER — VITAL AF 1.2 CAL PO LIQD
1000.0000 mL | ORAL | Status: DC
  Administered 2020-02-28: 1000 mL

## 2020-02-28 MED ORDER — BUTALBITAL-APAP-CAFFEINE 50-325-40 MG PO TABS: 1.0000 | ORAL_TABLET | Freq: Three times a day (TID) | ORAL | Status: DC | PRN

## 2020-02-28 MED ORDER — ORAL CARE MOUTH RINSE
15.0000 mL | Freq: Two times a day (BID) | OROMUCOSAL | Status: DC
  Administered 2020-02-29 – 2020-03-10 (×22): 15 mL via OROMUCOSAL

## 2020-02-28 MED ORDER — PRO-STAT SUGAR FREE PO LIQD
60.0000 mL | Freq: Two times a day (BID) | ORAL | Status: DC
  Administered 2020-02-28 – 2020-02-29 (×2): 60 mL
  Filled 2020-02-28: qty 60

## 2020-02-28 NOTE — Progress Notes (Signed)
[name]   DOB:July 23, 1946   BE#:[id number]   EOF#:[id number]  Subjective:  [name] awakens to voice and touch, barely opens eyes, says she is "okay," no other verbal response.   Objective: white woman examined in bed, restraints in place Vitals:   02/28/20 0700 02/28/20 0752  BP: (!) 172/63   Pulse: 69   Resp:    Temp:  97.6 F (36.4 C)  SpO2: 99%     Body mass index is 29.4 kg/m.  Intake/Output Summary (Last 24 hours) at 02/28/2020 0756 Last data filed at 02/28/2020 0535 Gross per 24 hour  Intake 1591.39 ml  Output 750 ml  Net 841.39 ml     Lungs no rales or wheezes--auscultated anterolaterally  Heart regular rate and rhythm (sinus on monitor)  Abdomen soft, +BS  Neuro as described above; otherwise nonfocal   CBG (last 3)  No results for input(s): GLUCAP in the last 72 hours.   Labs:  Lab Results  Component Value Date   WBC 14.0 (H) 02/28/2020   HGB 9.8 (L) 02/28/2020   HCT 30.5 (L) 02/28/2020   MCV 94.1 02/28/2020   PLT 100 (L) 02/28/2020   NEUTROABS 9.0 (H) 02/26/2020    _0 @  Urine Studies No results for input(s): UHGB, CRYS in the last 72 hours.  Invalid input(s): UACOL, UAPR, USPG, UPH, UTP, UGL, UKET, UBIL, UNIT, UROB, ULEU, UEPI, UWBC, URBC, UBAC, CAST, UCOM, [place]  Basic Metabolic Panel: Recent Labs  Lab 02/24/20 0920 02/24/20 0920 02/25/20 0218 02/25/20 0218 02/26/20 0259 02/26/20 0259 02/27/20 0221 02/27/20 0843 02/27/20 1440 02/27/20 2145 02/28/20 0416  NA 140   < > 139   < > 139   < > 137 137 142 155* 147*  K 2.3*   < > 3.0*   < > 2.8*   < > 3.0*  --   --   --  3.0*  CL 106  --  110  --  112*  --  110  --   --   --  116*  CO2 21*  --  18*  --  19*  --  18*  --   --   --  16*  GLUCOSE 143*  --  140*  --  118*  --  118*  --   --   --  149*  BUN 6*  --  8  --  9  --  7*  --   --   --  12  CREATININE 0.73  --  0.80  --  0.80  --  0.72  --   --   --  0.81  CALCIUM 8.5*  --  8.3*  --  8.3*  --  8.5*  --   --   --  9.1   MG 1.8  --  2.3  --   --   --   --   --   --   --   --    < > = values in this interval not displayed.   GFR Estimated Creatinine Clearance: 57.8 mL/min (by C-G formula based on SCr of 0.81 mg/dL). Liver Function Tests: No results for input(s): AST, ALT, ALKPHOS, BILITOT, PROT, ALBUMIN in the last 168 hours. No results for input(s): LIPASE, AMYLASE in the last 168 hours. No results for input(s): AMMONIA in the last 168 hours. Coagulation profile Recent Labs  Lab 02/24/20 1450  INR 1.3*    CBC: Recent Labs  Lab 02/22/20 0346 02/24/20 1144 02/25/20 0218 02/26/20 0259  02/26/20 0818 02/27/20 0221 02/28/20 0416  WBC 18.4*   < > 10.6* 11.2* 11.2* 13.0* 14.0*  NEUTROABS 16.2*  --  9.1* 9.0*  --   --   --   HGB 13.4   < > 11.6* 10.6* 10.6* 10.5* 9.8*  HCT 40.4   < > 35.0* 32.5* 32.4* 32.0* 30.5*  MCV 90.6   < > 90.7 92.9 93.1 91.4 94.1  PLT 160   < > 23* 38* 41* 51* 100*   < > = values in this interval not displayed.   Cardiac Enzymes: No results for input(s): CKTOTAL, CKMB, CKMBINDEX, TROPONINI in the last 168 hours. BNP: Invalid input(s): POCBNP CBG: Recent Labs  Lab 02/25/20 0519  GLUCAP 122*   D-Dimer No results for input(s): DDIMER in the last 72 hours. Hgb A1c No results for input(s): HGBA1C in the last 72 hours. Lipid Profile No results for input(s): CHOL, HDL, LDLCALC, TRIG, CHOLHDL, LDLDIRECT in the last 72 hours. Thyroid function studies No results for input(s): TSH, T4TOTAL, T3FREE, THYROIDAB in the last 72 hours.  Invalid input(s): FREET3 Anemia work up No results for input(s): VITAMINB12, FOLATE, FERRITIN, TIBC, IRON, RETICCTPCT in the last 72 hours. Microbiology No results found for this or any previous visit (from the past 240 hour(s)).    Studies:  CT HEAD WO CONTRAST  Result Date: 02/26/2020 CLINICAL DATA:  Follow-up cerebral hemorrhage. EXAM: CT HEAD WITHOUT CONTRAST TECHNIQUE: Contiguous axial images were obtained from the base of the skull  through the vertex without intravenous contrast. COMPARISON:  Head MRI/MRV 02/25/2020 and CT 02/24/2020 FINDINGS: Brain: There is increasing cortical and subcortical edema in the left greater than right temporal and occipital lobes. Subacute hemorrhage in the right lateral ventricle is unchanged, and the ventricles are unchanged in size with mild dilatation of the right temporal horn. No definite new intracranial hemorrhage is identified. There is no extra-axial fluid collection. A background of diffuse hypoattenuation throughout the cerebral white matter bilaterally is nonspecific but compatible with severe chronic small vessel ischemic disease. There are small chronic cerebellar infarcts with unchanged mild edema in the left cerebellar hemisphere. Vascular: Hyperdensity of the internal cerebral veins, vein of Galen, straight sinus, and left transverse sinus corresponding to thrombosis on MRV. Skull: No fracture or suspicious osseous lesion. Sinuses/Orbits: Partially visualized right maxillary sinus mucosal thickening. Clear mastoid air cells. Unremarkable orbits. Other: None. IMPRESSION: 1. Increased edema in the left greater than right temporal and occipital lobes which may reflect worsening edema from venous hypertension and venous infarcts. 2. Unchanged right lateral ventricle hemorrhage. Unchanged ventricular size. 3. Severe chronic small vessel ischemic disease. 4. Multifocal venous thrombosis as previously evaluated by dedicated angiographic imaging. Electronically Signed   By: Logan Bores M.D.   On: 02/26/2020 12:41   DG Naso G Tube Plc W/Fl W/Rad  Result Date: 02/26/2020 CLINICAL DATA:  History of stroke. Dysphagia. EXAM: NASO G TUBE PLACEMENT WITH FL AND WITH RAD CONTRAST:  None FLUOROSCOPY TIME:  Fluoroscopy Time:  0.2 minutes Number of Acquired Spot Images: 1 COMPARISON:  None. FINDINGS: Nasogastric tube was placed. Fluoroscopic spot image confirmed appropriate positioning in the distal stomach.  Nurse tape the tube in place and patient was transported back to the floor. IMPRESSION: Successful nasogastric tube placement with fluoroscopic confirmation of appropriate position in the distal stomach. Electronically Signed   By: Franki Cabot M.D.   On: 02/26/2020 12:54   [place] EKG SITE RITE  Result Date: 02/27/2020 If Site Rite image not attached, placement  could not be confirmed due to current cardiac rhythm.   Assessment: 74 y.o. [place] woman presenting 02/13/2020 with headache, found to have biventricular bleeds in the setting of panmyelosis, subsequently with pulmonary emboli and sinus venous thromboses in the setting of heparin induced thrombocytopenia  (1) polycythemia vera  (a) JAK2 mutation positive confirming diagnosis  (b) bone marrow biopsy 02/13/2020 shows no evidence of leukemia, c/w P Vera  (c)  leukapheresis x3 . Last leukapheresis Friday 02/18/2020   (i) HD catheter removed after pheresis on  02/18/2020  (d) hydrea as needed to keep HCT <45 and platelets in normal range   (2) heparin induced thrombocytopenia: positive HIT screen  (a) severe thrombocythemia noted 02/24/2020  (b) bilateral pulmonary emboli 02/22/2020   (i) s/p IVC filter placement 02/22/2020   (ii) atrial fibrillation with RVR  (c) venous sinus thromboses 02/24/2020   (i) s/p mechanical thrombectomy  (d) argatroban started 02/25/2020  Plan:  From a hematologic point of view patient is improved. The HCT is well under 45, so hydrea is not needed at this time. Will also d/c allopurinol. The platelet count is rising as HIT resolves.  Patient will need to be anticoagulated a minimum of 6 months. We will continue argatroban until she is ready for d/c at which point we will switch to a DOAC  Discussed with patient's husband at bedside.  Will continue to follow with you.     Chauncey Cruel, MD 02/28/2020  7:56 AM Medical Oncology and Hematology [address] [facility] [place], [place] [address] Tel. [phone]    Fax. [phone]

## 2020-02-28 NOTE — Procedures (Signed)
Cortrak  Person Inserting Tube:  Esaw Dace, RD Tube Type:  Cortrak - 43 inches Tube Location:  Left nare Initial Placement:  Stomach Secured by: Bridle Technique Used to Measure Tube Placement:  Documented cm marking at nare/ corner of mouth Cortrak Secured At:  56 cm     Cortrak Tube Team Note:  Consult received to place a Cortrak feeding tube.   No x-ray is required. RN may begin using tube.   If the tube becomes dislodged please keep the tube and contact the Cortrak team at [email or web address] ([email or web address]) for replacement.  If after hours and replacement cannot be delayed, place a NG tube and confirm placement with an abdominal x-ray.    Kerman Passey MS, RDN, LDN, CNSC RD Pager Number and Weekend/On-Call After Hours Pager Located in [place]

## 2020-02-28 NOTE — Consult Note (Signed)
Physical Medicine and Rehabilitation Consult   Reason for Consult: Functional decline due to ICH/bilateral strokes.  Referring Physician: Dr. Erlinda Hong.    HPI: [name] is a 74 y.o. female with history of HTN who was admitted on 02/14/20 with severe persistent HA and N/V. CT head revealed acute IVH in right lateral ventricle with severe chronic ischemic microangiopathy. CBC revealed thrombocytosis with plt- 990, hgb 17.7 and WBC 12.0. Neurology questioned if bleed HTN v/s CAA related. Dr. Jana Hakim was consulted for input and recommended bone biopsy as well as leukopheresis for work up of panmyelosis felt to be due to polycythemia vera.  2D echo done revealing EF 65-70% with no wall abnormality. She was placed on low dose cleviprex and was started on pheresis with improvement in cell counts. Bone biopsy positive for JAK2 confirming polycythemia vera-- did not meet criteria for von Willebrand disease with borderline low ristocetin cofactor -->to be repeated once patient in remission. She developed lethargy 4/5 with repeat CT head showed IVH without change. MRI/MRA brain ordered for work up and showed stable bleed and progression of extensive confluent hyperintensity thorough out the brain question due to microangiopathy v/s amyloid angiopathy. She completed leukopheresis X 3 with plans for d/c but had worsening of HA on 4/10. As hgb up to 17.3, she underwent phlebotomy of one unit and CTA head done showing patent vessels but suboptimal evaluation due to artifact. EEG done which showed moderate diffuse encephalopathy and was negative for seizures   Depakote was added with some improvement in HA. She developed SOB with intermittent tachycardia and CTA chest done showing acute BLL emboli and emphysematous changes seen in BUL. BLE dopplers were negative for DVT. IVC filter placed by Dr. Anselm Pancoast on 4/13 due to [place]. She developed A fib with RVR with short runs of NSVT on 04/14. Cardiology consulted for  input and recommended IV metoprolol with question of cardioversion due to hypotension. She was transferred to ICU for monitoring and required IV amiodarone for rate recurrent flutter.  She developed increased in HA with drop in platelets to <24,00 and IV solumedrol added. Work up positive for HIT.  CT head repeated 4/15 showing hypodensity in straight sinus and follow up CT venogram done revealing extensive thrombus within confluence of sinuses and straight sinus extending into vein of Galen and fairly extensive multifocal thrombus within R>L transverse and sigmoid dural venous sinuses.  She underwent emergent thrombectomy with recanalization of deep system with decrease in clot burden. Follow up MRI brain 4/16 showed numerous tiny acute infarcts in bilateral cerebral and cerebellar hemispheres felt to be embolic, 2.3 cm acute cortical infarct right temporal occipital lobes likely venous infarct, new region of parenchymal swelling left frontal lobe and frontal operculum and unchanged subacute IVH without hydrocephalus.  She was started on hypertonic saline for edema control and case discussed with Hem/Onc who recommended lower therapeutic range of Argotroban.  Mentation continues to fluctuate with bouts of agitation and lethargy. On tube feeds for nutritional supplement. She is able to follow some contextual cues with therapy but has had significant decline in functional status. CIR recommended for follow up therapy.   Review of Systems  Unable to perform ROS: Mental acuity     Past Medical History:  Diagnosis Date  . Hepatitis C   . Hyperlipidemia   . Hypertension   . Osteoporosis     Past Surgical History:  Procedure Laterality Date  . IR ANGIO INTRA EXTRACRAN SEL INTERNAL CAROTID BILAT  MOD SED  02/24/2020  . IR ANGIO VERTEBRAL SEL VERTEBRAL UNI R MOD SED  02/24/2020  . IR IVC FILTER PLMT / S&I /IMG GUID/MOD SED  02/22/2020  . IR THROMBECT VENO MECH MOD SED  02/24/2020  . IR US GUIDE VASC ACCESS  RIGHT  02/24/2020  . IR US GUIDE VASC ACCESS RIGHT  02/24/2020  . RADIOLOGY WITH ANESTHESIA N/A 02/24/2020   Procedure: IR WITH ANESTHESIA;  Surgeon: Radiologist, Medication, MD;  Location: [facility];  Service: Radiology;  Laterality: N/A;    Family History  Problem Relation Age of Onset  . Heart disease Mother   . Heart disease Father      Social History:  Married. Retired Radio broadcast assistant.  reports that she has quit smoking. She has never used smokeless tobacco. She reports that she does not drink alcohol or use drugs.    Allergies  Allergen Reactions  . Erythromycin Itching and Other (See Comments)    Severe stomach pains, diarrhea  . Heparin     Likely HIT   Medications Prior to Admission  Medication Sig Dispense Refill  . ALPRAZolam (XANAX) 0.5 MG tablet Take 0.25 mg by mouth daily as needed for anxiety.     Marland Kitchen amLODipine (NORVASC) 5 MG tablet Take 5 mg by mouth daily.    Marland Kitchen aspirin 81 MG tablet Take 1 tablet (81 mg total) by mouth daily. 60 tablet 2  . atorvastatin (LIPITOR) 10 MG tablet Take 10 mg by mouth every evening.     . Cholecalciferol (VITAMIN D3) 2000 UNITS TABS Take 2,000 Units by mouth every evening.    Marland Kitchen levothyroxine (SYNTHROID, LEVOTHROID) 125 MCG tablet Take 125 mcg by mouth daily before breakfast.    . losartan (COZAAR) 100 MG tablet Take 100 mg by mouth every evening.    . Probiotic Product (ALIGN PO) Take 1 capsule by mouth daily.    . SYMBICORT 160-4.5 MCG/ACT inhaler Inhale 1 puff into the lungs daily as needed (shortness of breath).     . TRELEGY ELLIPTA 100-62.5-25 MCG/INH AEPB Inhale 1 puff into the lungs daily as needed (shortness of breath).       Home: Home Living Family/patient expects to be discharged to:: Private residence Living Arrangements: Spouse/significant other Available Help at Discharge: Family Type of Home: House Home Access: Stairs to enter Technical brewer of Steps: 2 Entrance Stairs-Rails: None Home Layout: Two level, Able to live on  main level with bedroom/bathroom Bathroom Shower/Tub: Chiropodist: Standard Home Equipment: None  Functional History: Prior Function Level of Independence: Independent Comments: pt drives, does everything for self PTA Functional Status:  Mobility: Bed Mobility Overal bed mobility: Modified Independent Bed Mobility: Supine to Sit, Sit to Supine Supine to sit: Supervision General bed mobility comments: egressed bed to chair position during session. Pt initially with R lateral lean upon entry requiring maxA for repositioning towards midline  Transfers Overall transfer level: Needs assistance Equipment used: None Transfers: Sit to/from Stand Sit to Stand: Min guard General transfer comment: attempted sit<>stand while in bed egress to chair with +2 HHA (via face to face method). Pt resisting all attempts and with strong posterior lean against therapists when attempting to bring trunk forward away from [facility]. Multiple attempts with pt resisting throughout Ambulation/Gait Ambulation/Gait assistance: Min guard Gait Distance (Feet): 100 Feet Assistive device: Rolling walker (2 wheeled) Gait Pattern/deviations: Step-through pattern, Decreased stride length General Gait Details: cues for proximity to RW. Pt directing distance but after 100' had to sit for grossly  3 min to recover due to fatigue prior to additional trial of 100' Gait velocity: decr Gait velocity interpretation: 1.31 - 2.62 ft/sec, indicative of limited community ambulator    ADL: ADL Overall ADL's : Needs assistance/impaired Eating/Feeding: Set up, Sitting Grooming: Wash/dry face, Brushing hair, Set up, Min guard Grooming Details (indicate cue type and reason): max cueing to initiate  Lower Body Dressing: Total assistance, Sitting/lateral leans Lower Body Dressing Details (indicate cue type and reason): pt would not initiate donning sock when instructed  Toilet Transfer: Min guard Toilet Transfer Details  (indicate cue type and reason): ambulated without A in session, however stated "I feel like Im drunk" when ambulating Toileting- Clothing Manipulation and Hygiene: Min guard Toileting - Clothing Manipulation Details (indicate cue type and reason): Safety Functional mobility during ADLs: Min guard, Minimal assistance General ADL Comments: pt with decrease in functional status compared to previous OT session  Cognition: Cognition Overall Cognitive Status: Impaired/Different from baseline Orientation Level: Oriented to person, Disoriented to time, Disoriented to situation, Disoriented to place Attention: Sustained Sustained Attention: Impaired Sustained Attention Impairment: Verbal complex Memory: Impaired Memory Impairment: Retrieval deficit(pt benefited from category cue for 3/5 items, multiple choice for 1/5 items and recalled one item without cue) Cognition Arousal/Alertness: Awake/alert Behavior During Therapy: Flat affect Overall Cognitive Status: Impaired/Different from baseline Area of Impairment: Orientation, Attention, Following commands, Safety/judgement, Awareness, Problem solving Orientation Level: Disoriented to, Place, Time, Situation Current Attention Level: Focused Memory: Decreased short-term memory Following Commands: Follows one step commands inconsistently Safety/Judgement: Decreased awareness of safety, Decreased awareness of deficits Awareness: Intellectual Problem Solving: Slow processing, Decreased initiation, Requires verbal cues, Requires tactile cues General Comments: pt requires max multimodal cueing to follow simple commands - doing so inconsistently. Appears pt is better able to carry out purposeful/functional task (i.e. combing hair, wash face) when instructed vs simple instruction (squeeze my hand). Pt often responding to questions with "possibly" and "there's no tellin'" when asked   Blood pressure (!) 161/64, pulse 72, temperature (!) 97.5 F (36.4 C),  resp. rate 18, height 5\' 2"  (1.575 m), weight 72.9 kg, SpO2 100 %. Physical Exam  General: Alert and oriented x 0, No apparent distress HEENT: Head is normocephalic, atraumatic, PERRLA, EOMI, sclera anicteric, oral mucosa pink and moist, dentition intact, NGT in place.   Neck: Supple without JVD or lymphadenopathy Heart: Reg rate and rhythm. No murmurs rubs or gallops Chest: CTA bilaterally without wheezes, rales, or rhonchi; no distress Abdomen: Soft, non-tender, non-distended, bowel sounds positive. Extremities: No clubbing, cyanosis, or edema. Pulses are 2+ Skin: Clean and intact without signs of breakdown Neuro/MSK: Unable to follow commands. Nonsensical vocalization. Opens eyes and can make eye contact during exam. Severe expressive and receptive aphasia.  Psych: Pt's affect is inappropriate--aphasic, unable to follow commands   Results for orders placed or performed during the hospital encounter of 02/13/20 (from the past 24 hour(s))  APTT     Status: Abnormal   Collection Time: 02/27/20  6:46 PM  Result Value Ref Range   aPTT 61 (H) 24 - 36 seconds  Sodium     Status: Abnormal   Collection Time: 02/27/20  9:45 PM  Result Value Ref Range   Sodium 155 (H) 135 - 145 mmol/L  CBC     Status: Abnormal   Collection Time: 02/28/20  4:16 AM  Result Value Ref Range   WBC 14.0 (H) 4.0 - 10.5 K/uL   RBC 3.24 (L) 3.87 - 5.11 MIL/uL   Hemoglobin 9.8 (L) 12.0 -  15.0 g/dL   HCT 30.5 (L) 36.0 - 46.0 %   MCV 94.1 80.0 - 100.0 fL   MCH 30.2 26.0 - 34.0 pg   MCHC 32.1 30.0 - 36.0 g/dL   RDW 16.6 (H) 11.5 - 15.5 %   Platelets 100 (L) 150 - 400 K/uL   nRBC 0.0 0.0 - 0.2 %  Basic metabolic panel     Status: Abnormal   Collection Time: 02/28/20  4:16 AM  Result Value Ref Range   Sodium 147 (H) 135 - 145 mmol/L   Potassium 3.0 (L) 3.5 - 5.1 mmol/L   Chloride 116 (H) 98 - 111 mmol/L   CO2 16 (L) 22 - 32 mmol/L   Glucose, Bld 149 (H) 70 - 99 mg/dL   BUN 12 8 - 23 mg/dL   Creatinine, Ser 0.81  0.44 - 1.00 mg/dL   Calcium 9.1 8.9 - 10.3 mg/dL   GFR calc non Af Amer >60 >60 mL/min   GFR calc Af Amer >60 >60 mL/min   Anion gap 15 5 - 15  APTT     Status: Abnormal   Collection Time: 02/28/20  4:16 AM  Result Value Ref Range   aPTT 56 (H) 24 - 36 seconds  Sodium     Status: Abnormal   Collection Time: 02/28/20  9:20 AM  Result Value Ref Range   Sodium 147 (H) 135 - 145 mmol/L  Magnesium     Status: None   Collection Time: 02/28/20 12:33 PM  Result Value Ref Range   Magnesium 2.2 1.7 - 2.4 mg/dL  Phosphorus     Status: None   Collection Time: 02/28/20 12:33 PM  Result Value Ref Range   Phosphorus 2.6 2.5 - 4.6 mg/dL  Glucose, capillary     Status: Abnormal   Collection Time: 02/28/20  3:43 PM  Result Value Ref Range   Glucose-Capillary 137 (H) 70 - 99 mg/dL   CT HEAD WO CONTRAST  Result Date: 02/28/2020 CLINICAL DATA:  Follow-up cerebral hemorrhage and dural venous sinus thrombosis. EXAM: CT HEAD WITHOUT CONTRAST TECHNIQUE: Contiguous axial images were obtained from the base of the skull through the vertex without intravenous contrast. COMPARISON:  02/26/2020 FINDINGS: Brain: Left greater than right temporo-occipital edema has improved with a small discrete subacute cortical infarct again noted laterally in the right temporo-occipital region. No new cortically based infarct is identified. Hemorrhage in the right greater than left lateral ventricles demonstrates slight redistribution but has not significantly changed in volume. The ventricles are unchanged in size including slight dilatation of the right temporal horn. There is no midline shift or extra-axial fluid collection. Mild left cerebellar edema is less conspicuous. A background of diffuse hypoattenuation throughout the cerebral white matter is again noted and nonspecific but compatible with severe chronic small vessel ischemic disease. There is no midline shift or extra-axial fluid collection. Vascular: Similar appearance of  hyperdensity along the internal cerebral veins, vein of Galen, straight sinus, and left transverse sinus corresponding to known thrombosis. Calcified atherosclerosis at the skull base. Skull: No fracture or suspicious osseous lesion. Sinuses/Orbits: Partially visualized bubbly fluid in the right maxillary sinus. Clear mastoid air cells. Unremarkable orbits. Other: None. IMPRESSION: 1. Decreased left greater than right temporo-occipital edema. 2. Unchanged intraventricular hemorrhage. No hydrocephalus. 3. Known venous sinus thrombosis. 4. No new intracranial abnormality identified. Electronically Signed   By: Logan Bores M.D.   On: 02/28/2020 13:48   [place] EKG SITE RITE  Result Date: 02/27/2020 If Site [place]  not attached, placement could not be confirmed due to current cardiac rhythm.    Assessment/Plan: Diagnosis: IVH 1. Does the need for close, 24 hr/day medical supervision in concert with the patient's rehab needs make it unreasonable for this patient to be served in a less intensive setting? Yes 2. Co-Morbidities requiring supervision/potential complications:  HTN, overweight (BMI 29.4), dysphagia, impaired cognition, severe chronic ischemic microangiopathy 3. Due to bladder management, bowel management, safety, skin/wound care, disease management, medication administration, pain management and patient education, does the patient require 24 hr/day rehab nursing? Yes 4. Does the patient require coordinated care of a physician, rehab nurse, therapy disciplines of PT, OT, SLP to address physical and functional deficits in the context of the above medical diagnosis(es)? Yes Addressing deficits in the following areas: balance, endurance, locomotion, strength, transferring, bowel/bladder control, bathing, dressing, feeding, grooming, toileting, cognition, speech, language, swallowing and psychosocial support 5. Can the patient actively participate in an intensive therapy program of at least 3 hrs of  therapy per day at least 5 days per week? Yes 6. The potential for patient to make measurable gains while on inpatient rehab is excellent 7. Anticipated functional outcomes upon discharge from inpatient rehab are mod assist  with PT, mod assist with OT, mod assist with SLP. 8. Estimated rehab length of stay to reach the above functional goals is: 20-24 days 9. Anticipated discharge destination: Home 10. Overall Rehab/Functional Prognosis: excellent and good  RECOMMENDATIONS: This patient's condition is appropriate for continued rehabilitative care in the following setting: CIR Patient has agreed to participate in recommended program. N/A Note that insurance prior authorization may be required for reimbursement for recommended care.  Comment: Mrs. Brannock may be a good CIR candidate if better able to engage in therapy and follow commands. We will continue to follow in her care. Thank you for this consult.   Bary Leriche, PA-C 02/28/2020   I have personally performed a face to face diagnostic evaluation, including, but not limited to relevant history and physical exam findings, of this patient and developed relevant assessment and plan.  Additionally, I have reviewed and concur with the physician assistant's documentation above.  Leeroy Cha, MD

## 2020-02-28 NOTE — Progress Notes (Signed)
Discussed with Dr. Cheral Marker restarting 3% saline gtt. Sodium down to 147 from 155. Will restart at 50 mL/hr per Dr. Cheral Marker.

## 2020-02-28 NOTE — Progress Notes (Signed)
Spoke with Dr. Cheral Marker regarding SBP> 190. Unable to give another prn dose until 0806. Per Dr. Cheral Marker, continue titrating Cleviprex until SBP within parameters.

## 2020-02-28 NOTE — Progress Notes (Addendum)
Occupational Therapy Re-Evaluation Patient Details Name: [name] MRN: [id number] DOB: 1946/02/07 Today's Date: 02/28/2020    History of present illness 74 yo female presented with persistent HA, nausea and vomiting pt with stable R lateral ventricle IVH. Pt with acute bil PE 4/13, IVC filter placed 4/14. 4/15 head CT demonstrated deep cerebral thrombosis s/p IR for thrombectomy with expressive aphasia after. 4/16 MRI showed multiple cerebral and cerebellar infarcts, Rt temporal infarct with cytotoxic edema and hypertonic saline started. PMHx: HTN, HLD, hep C, hypothyroidism   OT comments  Pt seen for re-evaluation s/p cognitive/speech changes and discovery of new infarcts. Pt now with expressive/receptive difficulties, notable cognitive impairments impacting her overall functional status. Pt requiring max multimodal cues for following simple commands, doing so inconsistently but with improvements noted when instructing pt to perform familiar ADL task. Pt able to complete simple grooming ADL while bed in chair position with setup/supervision and max cues to initiate task. She currently requires totalA for LB ADL; strongly resistive to standing attempts today despite multiple attempts from therapists. Given decrease in overall function have updated discharge recommendations. Pt will benefit from post acute rehab services to maximize her safety and independence with ADL/mobility prior to return home. Will continue to follow acutely.   BP start of session 161/65 With bed egress to chair position 177/75, then 152/85 End of session supine in bed 164/60    Follow Up Recommendations  CIR;Supervision/Assistance - 24 hour    Equipment Recommendations  3 in 1 bedside commode;Other (comment)(TBD in next venue)    Recommendations for Other Services Rehab consult    Precautions / Restrictions Precautions Precautions: Fall Precaution Comments: SBP <140, expressive/receptive difficulties   Restrictions Weight Bearing Restrictions: No       Mobility Bed Mobility               General bed mobility comments: egressed bed to chair position during session. Pt initially with R lateral lean upon entry requiring maxA for repositioning towards midline   Transfers                 General transfer comment: attempted sit<>stand while in bed egress to chair with +2 HHA (via face to face method). Pt resisting all attempts and with strong posterior lean against therapists when attempting to bring trunk forward away from [facility]. Multiple attempts with pt resisting throughout    Balance Overall balance assessment: Needs assistance Sitting-balance support: Feet unsupported Sitting balance-Leahy Scale: Poor                                     ADL either performed or assessed with clinical judgement   ADL Overall ADL's : Needs assistance/impaired     Grooming: Wash/dry face;Brushing hair;Set up;Min guard Grooming Details (indicate cue type and reason): max cueing to initiate              Lower Body Dressing: Total assistance;Sitting/lateral leans Lower Body Dressing Details (indicate cue type and reason): pt would not initiate donning sock when instructed                General ADL Comments: pt with decrease in functional status compared to previous OT session     Vision   Additional Comments: will benefit from further testing PRN, pt intermittently closing one eye but not following enough commands to complete formal assessment    Perception     Praxis  Cognition Arousal/Alertness: Awake/alert Behavior During Therapy: Flat affect Overall Cognitive Status: Impaired/Different from baseline Area of Impairment: Orientation;Attention;Following commands;Safety/judgement;Awareness;Problem solving                 Orientation Level: Disoriented to;Place;Time;Situation Current Attention Level: Focused Memory: Decreased short-term  memory Following Commands: Follows one step commands inconsistently Safety/Judgement: Decreased awareness of safety;Decreased awareness of deficits Awareness: Intellectual Problem Solving: Slow processing;Decreased initiation;Requires verbal cues;Requires tactile cues General Comments: pt requires max multimodal cueing to follow simple commands - doing so inconsistently. Appears pt is better able to carry out purposeful/functional task (i.e. combing hair, wash face) when instructed vs simple instruction (squeeze my hand). Pt often responding to questions with "possibly" and "there's no tellin'" when asked        Exercises     Shoulder Instructions       General Comments      Pertinent Vitals/ Pain       Pain Assessment: Faces Faces Pain Scale: No hurt  Home Living                                          Prior Functioning/Environment              Frequency  Min 2X/week        Progress Toward Goals  OT Goals(current goals can now be found in the care plan section)  Progress towards OT goals: OT to reassess next treatment  Acute Rehab OT Goals Patient Stated Goal: none stated today, previous to go home with family OT Goal Formulation: With patient Time For Goal Achievement: 03/13/20 Potential to Achieve Goals: Good  Plan Discharge plan needs to be updated    Co-evaluation    PT/OT/SLP Co-Evaluation/Treatment: Yes Reason for Co-Treatment: Complexity of the patient's impairments (multi-system involvement);Necessary to address cognition/behavior during functional activity;To address functional/ADL transfers   OT goals addressed during session: ADL's and self-care      AM-PAC OT "6 Clicks" Daily Activity     Outcome Measure   Help from another person eating meals?: Total(NPO) Help from another person taking care of personal grooming?: A Lot Help from another person toileting, which includes using toliet, bedpan, or urinal?: Total Help from  another person bathing (including washing, rinsing, drying)?: A Lot Help from another person to put on and taking off regular upper body clothing?: A Lot Help from another person to put on and taking off regular lower body clothing?: Total 6 Click Score: 9    End of Session    OT Visit Diagnosis: Unsteadiness on feet (R26.81);Muscle weakness (generalized) (M62.81);Other symptoms and signs involving cognitive function;Other symptoms and signs involving the nervous system (R29.898)   Activity Tolerance Patient tolerated treatment well   Patient Left in bed;with call bell/phone within reach;with bed alarm set;with restraints reapplied   Nurse Communication Mobility status        Time: [id number] OT Time Calculation (min): 27 min  Charges: OT General Charges $OT Visit: 1 Visit OT Evaluation $OT Re-eval: 1 Re-eval  Lou Cal, OT Acute Rehabilitation Services Pager [phone] Office [phone]   Raymondo Band 02/28/2020, 1:01 PM

## 2020-02-28 NOTE — Progress Notes (Signed)
Initial Nutrition Assessment  DOCUMENTATION CODES:   Not applicable  INTERVENTION:   Tube feeding:  -Vital AF 1.2 @ 20 ml/hr via Cortrak -60 ml Prostat BID  Provides: 976 kcals (2512 kcal with cleviprex), 96 grams protein, 389 ml free water. Kcal exceed requirement to meet protein needs.   NUTRITION DIAGNOSIS:   Inadequate oral intake related to inability to eat as evidenced by NPO status.  GOAL:   Patient will meet greater than or equal to 90% of their needs  MONITOR:   Weight trends, Labs, I & O's, Diet advancement, Skin, TF tolerance  REASON FOR ASSESSMENT:   Consult Enteral/tube feeding initiation and management  ASSESSMENT:   Patient with PMH significant for HTN, HLD, and Hepatitis C. Presents this admission with right lateral ventricle IVH secondary to HTN.   Pt discussed during ICU rounds and with RN.   Pt agitated. 3% saline started for both cytotoxic/vasogenic edema. Remains on cleviprex. Unable to obtain nutrition/weight history. Intake documented 0-40% since admit. Made NPO this am due to aspiration concern.  Cortrak placed. Titrate TF to goal. Monitor for refeeding.   Weight history limited over the last year. Use 66.3 kg as EDW.   I/O: +8,915 ml since admit  UOP: 750 ml x 24 hrs  Cleviprex running @ 32 ml/hr- provides 1536 kcal from lipids daily   Drips: 3% saline @ 75 ml/hr, cleviprex Medications: decadron, senokot Labs: Na 147 (H) K 3.0 (L) CBG 118-149  NUTRITION - FOCUSED PHYSICAL EXAM:    Most Recent Value  Orbital Region  No depletion  Upper Arm Region  Mild depletion  Thoracic and Lumbar Region  Unable to assess  Buccal Region  No depletion  Temple Region  No depletion  Clavicle Bone Region  No depletion  Clavicle and Acromion Bone Region  No depletion  Scapular Bone Region  Unable to assess  Dorsal Hand  Unable to assess  Patellar Region  No depletion  Anterior Thigh Region  No depletion  Posterior Calf Region  No depletion  Edema  (RD Assessment)  Mild  Hair  Reviewed  Eyes  Reviewed  Mouth  Unable to assess  Skin  Reviewed  Nails  Unable to assess     Diet Order:   Diet Order            Diet NPO time specified  Diet effective now              EDUCATION NEEDS:   Not appropriate for education at this time  Skin:  Skin Assessment: Skin Integrity Issues: Skin Integrity Issues:: Incisions Incisions: coccyx, R knee  Last BM:  4/17  Height:   Ht Readings from Last 1 Encounters:  02/14/20 5\' 2"  (1.575 m)    Weight:   Wt Readings from Last 1 Encounters:  02/27/20 72.9 kg    BMI:  Body mass index is 29.4 kg/m.  Estimated Nutritional Needs:   Kcal:  1700-1900 kcal  Protein:  85-100 grams  Fluid:  >/= 1.7 L/day   Mariana Single RD, LDN Clinical Nutrition Pager listed in [place]

## 2020-02-28 NOTE — Progress Notes (Signed)
[place] for argatroban Indication: HIT  Allergies  Allergen Reactions  . Erythromycin Itching and Other (See Comments)    Severe stomach pains, diarrhea  . Heparin     Likely HIT    Patient Measurements: Height: 5\' 2"  (157.5 cm) Weight: 72.9 kg (160 lb 11.5 oz) IBW/kg (Calculated) : 50.1  Vital Signs: Temp: 97.6 F (36.4 C) (04/19 0752) Temp Source: Oral (04/19 0000) BP: 161/65 (04/19 1000) Pulse Rate: 72 (04/19 1000)  Labs: Recent Labs    02/26/20 0259 02/26/20 0259 02/26/20 0818 02/26/20 1549 02/27/20 0221 02/27/20 1846 02/28/20 0416  HGB 10.6*   < > 10.6*  --  10.5*  --  9.8*  HCT 32.5*   < > 32.4*  --  32.0*  --  30.5*  PLT 38*   < > 41*  --  51*  --  100*  APTT 62*   < > 61*   < > 59* 61* 56*  CREATININE 0.80  --   --   --  0.72  --  0.81   < > = values in this interval not displayed.    Estimated Creatinine Clearance: 57.8 mL/min (by C-G formula based on SCr of 0.81 mg/dL).  Assessment: 52 f initially admitted 4/4 with acute IVH and thrombocythemia with PLTC of 990k. Patient was ultimately diagnosed with polythemia, now s/p leukophoresis, therapeutic phlebotomy, and initiation of hydroxyurea. On 4/13 - found to have B/L PE - decision was risk of AC was too great and IVC placed. CT scan 4/15 showed extensive thrombus within confluence of sinuses and sent to IR with mechanical thrombectomy x5.  Of note, plt on 4/4 990>>4/13 160>>4/15 26 and now 23 4/16. DIC panel sent with DDimer > 20, fibrinogen 203. Heparin antibody OD 2.55 with 4T score ~6-8 (high likelihood). Patient received 3 doses of heparin during pheresis and no other heparin documented.   Discussed with Dr Shelba Flake (oncology) to initiate argatroban to treat HIT associated clots (since risk of further clotting may be worse than risk of re-bleed), though we will have to be careful and target the low end of therapeutic range due to recent IVH. Normally, once  therapeutic we would do daily aPTT - in this case, may need to stick with standard q12h aPTT with therapeutic levels to ensure we do not inadvertently overshoot and get higher than desired levels  APTT at goal of 56. No bleeding issues noted.   Goal of Therapy:  aPTT 50 - 60 seconds Monitor platelets by anticoagulation protocol: Yes   Initial Plan:  Continue argatroban at 0.2 mcg/kg/min Will f/u q12h PTT (0500/1700)  Acey Lav, PharmD  PGY1 Acute Care Pharmacy Resident 02/28/2020 10:41 AM

## 2020-02-28 NOTE — Progress Notes (Addendum)
   NSR on oral amiodarone.  Team signed off last week. [facility] will sign off.   Medication Recommendations:  Amiodarone until thru acute illness and safely on anticoagulation with DOAC, then DC amiodarone. Other recommendations (labs, testing, etc):  TSH  Follow up as an outpatient:  Will need cardiology f/u in 1 month post DC to determine when / if DC amiodarone.

## 2020-02-28 NOTE — Progress Notes (Addendum)
[place] for argatroban Indication: HIT  Allergies  Allergen Reactions  . Erythromycin Itching and Other (See Comments)    Severe stomach pains, diarrhea  . Heparin     Likely HIT    Patient Measurements: Height: 5\' 2"  (157.5 cm) Weight: 72.9 kg (160 lb 11.5 oz) IBW/kg (Calculated) : 50.1  Vital Signs: Temp: 97.5 F (36.4 C) (04/19 1546) BP: 160/55 (04/19 1700) Pulse Rate: 82 (04/19 1700)  Labs: Recent Labs    02/26/20 0259 02/26/20 0259 02/26/20 0818 02/26/20 1549 02/27/20 0221 02/27/20 0221 02/27/20 1846 02/28/20 0416 02/28/20 1748  HGB 10.6*   < > 10.6*  --  10.5*  --   --  9.8*  --   HCT 32.5*   < > 32.4*  --  32.0*  --   --  30.5*  --   PLT 38*   < > 41*  --  51*  --   --  100*  --   APTT 62*   < > 61*   < > 59*   < > 61* 56* 46*  CREATININE 0.80  --   --   --  0.72  --   --  0.81  --    < > = values in this interval not displayed.    Estimated Creatinine Clearance: 57.8 mL/min (by C-G formula based on SCr of 0.81 mg/dL).  Assessment: 80 f initially admitted 4/4 with acute IVH and thrombocythemia with PLTC of 990k. Patient was ultimately diagnosed with polythemia, now s/p leukophoresis, therapeutic phlebotomy, and initiation of hydroxyurea. On 4/13 - found to have B/L PE - decision was risk of AC was too great and IVC placed. CT scan 4/15 showed extensive thrombus within confluence of sinuses and sent to IR with mechanical thrombectomy x5.  Of note, plt on 4/4 990>>4/13 160>>4/15 26 and now 23 4/16. DIC panel sent with DDimer > 20, fibrinogen 203. Heparin antibody OD 2.55 with 4T score ~6-8 (high likelihood). Patient received 3 doses of heparin during pheresis and no other heparin documented.   Discussed with Dr Shelba Flake (oncology) to initiate argatroban to treat HIT associated clots (since risk of further clotting may be worse than risk of re-bleed), though we will have to be careful and target the low end of therapeutic  range due to recent IVH. Normally, once therapeutic we would do daily aPTT - in this case, may need to stick with standard q12h aPTT with therapeutic levels to ensure we do not inadvertently overshoot and get higher than desired levels  aPTT this evening is slightly SUBtherapeutic (aPTT 46 << 65, goal of 50-60), No bleeding or issues noted per discussion with RN. Plts up to 100 with AM labs.   Goal of Therapy:  aPTT 50 - 60 seconds Monitor platelets by anticoagulation protocol: Yes   Plan: - Increase Argatroban to 0.23 mcg/kg/min - Will recheck an aPTT in 2 hours after rate change.   Thank you for allowing pharmacy to be a part of this patient's care.  Alycia Rossetti, PharmD, BCPS Clinical Pharmacist Clinical phone for 02/28/2020: [phone] 02/28/2020 7:09 PM   **Pharmacist phone directory can now be found on [email or web address] ([email or web address]).  Listed under [place].  -------------------------------------------------------------------------------------- Addendum:   APTT this evening after a rate adjustment remains SUBtherapeutic (aPTT 45 << 46, goal of 50-60). Will increase again and recheck a 2h aPTT level.   Plan - Increase Argatroban to 0.27 mcg/kg/min - Will recheck another aPTT 2 hours  after rate change   Alycia Rossetti, PharmD, BCPS10:26 PM

## 2020-02-28 NOTE — Progress Notes (Signed)
Rehab Admissions Coordinator Note:  Patient was screened by Cleatrice Burke for appropriateness for an Inpatient Acute Rehab Consult per PT change in recommendations.  At this time, we are recommending Inpatient Rehab consult. I will place order per protocol.   Cleatrice Burke  RN MSN 02/28/2020, 2:40 PM  I can be reached at [phone].

## 2020-02-28 NOTE — Progress Notes (Signed)
STROKE TEAM PROGRESS NOTE   INTERVAL HISTORY Pt RN at bedside. Pt today awake alert, eyes open, does not seem to be in acute distress or pain or HA. However, she was found to have aphasia, perseveration on orientation questions, limited spontaneous speech, able to say her first name and "I am very thirsty". Not following simple commands. BP on the high end, cortrak placed. Will resume po BP meds. Will repeat CT head. Platelet improved to 100 today. Still on argatroban and 3% with decadron.   Vitals:   02/28/20 0645 02/28/20 0700 02/28/20 0752 02/28/20 0800  BP: (!) 192/54 (!) 172/63  (!) 158/53  Pulse: 71 69  74  Resp:  18    Temp:   97.6 F (36.4 C)   TempSrc:      SpO2: 98% 99%  98%  Weight:      Height:       CBC:  Recent Labs  Lab 02/25/20 0218 02/25/20 0218 02/26/20 0259 02/26/20 0818 02/27/20 0221 02/28/20 0416  WBC 10.6*   < > 11.2*   < > 13.0* 14.0*  NEUTROABS 9.1*  --  9.0*  --   --   --   HGB 11.6*   < > 10.6*   < > 10.5* 9.8*  HCT 35.0*   < > 32.5*   < > 32.0* 30.5*  MCV 90.7   < > 92.9   < > 91.4 94.1  PLT 23*   < > 38*   < > 51* 100*   < > = values in this interval not displayed.   Basic Metabolic Panel:  Recent Labs  Lab 02/24/20 0920 02/24/20 0920 02/25/20 0218 02/26/20 0259 02/27/20 0221 02/27/20 0843 02/27/20 2145 02/28/20 0416  NA 140   < > 139   < > 137   < > 155* 147*  K 2.3*   < > 3.0*   < > 3.0*  --   --  3.0*  CL 106   < > 110   < > 110  --   --  116*  CO2 21*   < > 18*   < > 18*  --   --  16*  GLUCOSE 143*   < > 140*   < > 118*  --   --  149*  BUN 6*   < > 8   < > 7*  --   --  12  CREATININE 0.73   < > 0.80   < > 0.72  --   --  0.81  CALCIUM 8.5*   < > 8.3*   < > 8.5*  --   --  9.1  MG 1.8  --  2.3  --   --   --   --   --    < > = values in this interval not displayed.   Lipid Panel:     Component Value Date/Time   CHOL 172 02/14/2020 0959   TRIG 46 02/14/2020 0959   HDL 71 02/14/2020 0959   CHOLHDL 2.4 02/14/2020 0959   VLDL 9  02/14/2020 0959   LDLCALC 92 02/14/2020 0959   HgbA1c:  Lab Results  Component Value Date   HGBA1C 6.1 (H) 02/14/2020    IMAGING past 24 hours CT HEAD WO CONTRAST  Result Date: 02/28/2020 CLINICAL DATA:  Follow-up cerebral hemorrhage and dural venous sinus thrombosis. EXAM: CT HEAD WITHOUT CONTRAST TECHNIQUE: Contiguous axial images were obtained from the base of the skull through the vertex without  intravenous contrast. COMPARISON:  02/26/2020 FINDINGS: Brain: Left greater than right temporo-occipital edema has improved with a small discrete subacute cortical infarct again noted laterally in the right temporo-occipital region. No new cortically based infarct is identified. Hemorrhage in the right greater than left lateral ventricles demonstrates slight redistribution but has not significantly changed in volume. The ventricles are unchanged in size including slight dilatation of the right temporal horn. There is no midline shift or extra-axial fluid collection. Mild left cerebellar edema is less conspicuous. A background of diffuse hypoattenuation throughout the cerebral white matter is again noted and nonspecific but compatible with severe chronic small vessel ischemic disease. There is no midline shift or extra-axial fluid collection. Vascular: Similar appearance of hyperdensity along the internal cerebral veins, vein of Galen, straight sinus, and left transverse sinus corresponding to known thrombosis. Calcified atherosclerosis at the skull base. Skull: No fracture or suspicious osseous lesion. Sinuses/Orbits: Partially visualized bubbly fluid in the right maxillary sinus. Clear mastoid air cells. Unremarkable orbits. Other: None. IMPRESSION: 1. Decreased left greater than right temporo-occipital edema. 2. Unchanged intraventricular hemorrhage. No hydrocephalus. 3. Known venous sinus thrombosis. 4. No new intracranial abnormality identified. Electronically Signed   By: Logan Bores M.D.   On:  02/28/2020 13:48     PHYSICAL EXAM  General - Well nourished, well developed, awake alert not in distress  Ophthalmologic - fundi not visualized due to noncooperation.  Respiratory - Lungs are clear to auscultation.  No wheezing or rhonchi  Cardiovascular - Regular rhythm and rate, not in afib.  Neuro - awake alert, eyes open, not in acute distress, not in pain or headache.  Found to have aphasia, limited speech output, able to say "I am very thirsty".  Able to tell me her first name but then perseverated on other orientation questions.  Not follow simple commands.  Not corporative on naming or repetition.  PERRL, eye mid position, blinking to visual threat bilaterally, able to track bilaterally.  Facial symmetrical. BUE against gravity, symmetrical. BLE 2/5 withdraw to pain, symmetrical. DTR 1+, no babinski. Sensation, coordination not corporative and gait not tested.   ASSESSMENT/PLAN [name] is a 74 y.o. female with history of HTN presenting with severe persistent HA accompanied by nausea and vomiting. This has now improved.   IVH - right lateral ventricle IVH secondary to ? CAA vs. HTN  CT head 4/5 0032 R lateral IVH. Severe chronic ischemic microangiopathy.  MRI 4/5 Stable IVH, severe leukoaraiosis. Multiple foci throughout progressed from previous MRI d/t microangiopathy vs amyloid.  MRA unremarkable   CT head repeat 4/5-4/10 - unchanged size and configuration of right IVH  CT Head 02/26/20 - Increased edema in the left greater than right temporal and occipital lobes which may reflect worsening edema from venous hypertension and venous infarcts.   CT head 02/28/20 decreased L>R edema. IVH same. Known SVT.    EEG - moderate diffuse encephalopathy  2D Echo EF 65-70%. No source of embolus   LDL 92   HgbA1c 6.1   DVT prophylaxis - argatroban IV   aspirin 81 mg daily prior to admission, now on argatroban due to HIT -:Plan AC for at least 6 mos. Switch to DOAC at  d/c  Therapy recommendations:  CIR  Disposition:  pending  Pancytosis due to polycythemia vera   WBC 12->24.4->17.9->11.2->13.0->14.0 (afebrile)  Hgb 17.1->18.6->16.2->13.4->10.6->10.5->9.8   PLT 990->738->548->324->160->23->38->41->51->100 (improving)  Dr. 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 Ron Agee as outpatient  HIT syndrome  B/l PE cerebral venous sinus thrombosis s/p IR  4/13 New SOB and tachycardia  CTA chest - BLE PE.   Filter placement on 4/13 as unable to get [facility] d/t IVH  4/15 CT head - straight sinus and vein of Galan CSVT  CTV Confirmed extensive thrombus within the confluence of sinuses and straight sinus, also extending into the vein of Galen. There is also fairly extensive multifocal thrombus within the right greater than left transverse and sigmoid dural venous sinuses.  S/p mechanical thrombectomy 02/24/20 of occluded deep cerebral veins  MRI 02/25/2020 shows multiple by cerebral and cerebellar infarcts as well as right temporal cortical venous infarct and cytotoxic edema  MRV persistent occlusion of 3 sinus, vein of Galan, right IJ, right basal vein of Rosenthal  CT head 4/17 increase edema in the left greater than right temporal and occipital lobes  CT head 4/19 decreased L>R edema. IVH same. Known SVT.  PLT 990->738->691->669->548->324->160->23->38->41->51->100 (improving)  HIT antibody positive  On argatroban IV -> continue, may switch to DOAC later. AC at least 6 months  Decadron 10mg  x 1 -> 4mg  q6  3% saline - started 02/27/20 at 75cc/h  Na Q 6 hrs  Na goal 150 - 155  Na 142->155->147->147  Headache, due to IVH and CSVT  Depakote 500 mg nightly discontinued  Pt now has Fioricet one Q8  hrs prn as well as tylenol prn  Solumedrol 500 mg -> prednisone -> off  NS at 100 cc's / hr started 4/10 -> 3% saline  Add topamax 25 bid  Decadron started 10 mg IV x 1 then 4 mg IV Q 6 hrs  CT head 4/17 increase edema in the left greater than right temporal and occipital lobes  CT head 4/19 decreased L>R edema. IVH same. Known SVT.  Will also consider Diamox if not improving, discussed with Dr. Jonette Eva hematology on call.   Afib RVR, new diagnosis  Rate in 160's -> now back to sinus  Diltizem gtt discontinued  On amiodarone  Continue amiodarone through acute illness and stop when safely on [facility] w/ DOAC  TSH pending   OK with AC for 6 mos only given acute illness and no recurrence of afib. Switch to DOAC at d/c  Cardiology signed off 02/26/20  Dysphagia  NPO status due to altered mental status  cortrak placed under fluoro 02/26/20 -> pt pulled off same day  cortrak replaced 4/19  On TF @ 20cc  Hypertension  Home meds:  losartan 100, amlodipine 5  On losartan 50 bid and amlodipine 10 and hydralazine 50 tid . SBP goal < 140  Back on Cleviprex, wean off as able   BP Stable  . 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 Multiple foci throughout progressed from previous MRI d/t microangiopathy vs amyloid.  As per husband, pt has some anomia as baseline  BP goal < 140  Hyperlipidemia  Home meds:  lipitor 10  LDL 92  Statin held in setting of acute ICH  Consider continuation of statin at discharge  Hypokalemia and Hypomagnesemia   Potassium 3.0->2.8->3.0->3.0 - supplement   Magnesium -  1.6 - 2.2->1.8->2.3  Other Stroke Risk Factors  Advanced age  Former Cigarette smoker  Family hx stroke (mother, father, and multiple other family members)  Other Active Problems  Hepatitis  C  Hypothyroid on synthroid. TSH WNL - resume synthroid   Stop allopurinol   Hospital day # 14  This patient is critically ill and at significant risk of neurological worsening, death and care requires constant monitoring of vital signs, hemodynamics,respiratory and cardiac monitoring, extensive review of multiple databases, frequent neurological assessment, discussion with family, other specialists and medical decision making of high complexity. I spent 40 minutes of neurocritical care time  in the care of  this patient. I had long discussion with husband over the phone, updated pt current condition, treatment plan and potential prognosis, and answered all the questions.  He expressed understanding and appreciation.   Rosalin Hawking, MD PhD Stroke Neurology 02/28/2020 9:00 AM   To contact Stroke Continuity provider, please refer to [email or web address] After hours, contact General Neurology

## 2020-02-28 NOTE — Progress Notes (Signed)
Physical Therapy Treatment Patient Details Name: [name] MRN: [id number] DOB: [birth year] Today's Date: 02/28/2020    History of Present Illness 74 yo female presented with persistent HA, nausea and vomiting pt with stable R lateral ventricle IVH. Pt with acute bil PE 4/13, IVC filter placed 4/14. 4/15 head CT demonstrated deep cerebral thrombosis s/p IR for thrombectomy with expressive aphasia after. 4/16 MRI showed multiple cerebral and cerebellar infarcts, Rt temporal infarct with cytotoxic edema and hypertonic saline started. PMHx: HTN, HLD, hep C, hypothyroidism    PT Comments    Pt seen in conjunction with OT. Pt with significant cognitive and functional change since last session due to multiple infarcts. Pt with expressive and receptive aphasia having great difficulty following commands for basic tasks such as kicking or putting on sock even with multimodal cueing and demonstration. Did follow contextual cues for combing hair and washing face. Pt with automatic movement of bil LE with quad strength at least 3/5 but would not move legs on command or with demonstration.   Pt resistant to attempting to mobilize with mod assist to roll and posterior resistance to bringing trunk off surface with bed in chair position and unable to accurately assess sitting balance or progress to even attempt rise from surface.   Per RN pt highly fluctuant in activity and will be very restless trying to roll, sit up and get out of bed vs lethargic. Per RN observation pt more active when BP <130 but difficult to control. Pt limited this session by BP even on cleviprex. BP supine initially 161/65 HR 75, initial chair position 177/75 HR 82, decline to 152/113 in full chair HR 83, 164/60 return to supine   Given significant change and cognitive deficits recommend CIR for progression of function and to decrease burden of care.    Follow Up Recommendations  CIR;Supervision/Assistance - 24 hour      Equipment Recommendations  Other (comment)(TBD with mobility progression)    Recommendations for Other Services       Precautions / Restrictions Precautions Precautions: Fall Precaution Comments: SBP <140, expressive/receptive difficulties  Restrictions Weight Bearing Restrictions: No    Mobility  Bed Mobility   Bed Mobility: Supine to Sit;Sit to Supine           General bed mobility comments: egressed bed to chair position during session. Pt initially with R lateral lean upon entry requiring maxA for repositioning towards midline   Transfers Overall transfer level: Needs assistance               General transfer comment: attempted sit<>stand while in bed egress to chair with +2 HHA (via face to face method). Pt resisting all attempts and with strong posterior lean against therapists when attempting to bring trunk forward away from [facility]. Multiple attempts with pt resisting throughout  Ambulation/Gait                 Stairs             Wheelchair Mobility    Modified Rankin (Stroke Patients Only)       Balance Overall balance assessment: Needs assistance Sitting-balance support: Feet unsupported Sitting balance-Leahy Scale: Poor Sitting balance - Comments: initial right lean with trunk supported throughout and unable to bring trunk off surface                                    Cognition Arousal/Alertness: Awake/alert  Behavior During Therapy: Flat affect Overall Cognitive Status: Impaired/Different from baseline Area of Impairment: Orientation;Attention;Following commands;Safety/judgement;Awareness;Problem solving                 Orientation Level: Disoriented to;Place;Time;Situation Current Attention Level: Focused Memory: Decreased short-term memory Following Commands: Follows one step commands inconsistently Safety/Judgement: Decreased awareness of safety;Decreased awareness of deficits Awareness:  Intellectual Problem Solving: Slow processing;Decreased initiation;Requires verbal cues;Requires tactile cues General Comments: pt requires max multimodal cueing to follow simple commands - doing so inconsistently. Appears pt is better able to carry out purposeful/functional task (i.e. combing hair, wash face) when instructed vs simple instruction (squeeze my hand). Pt often responding to questions with "possibly" and "there's no tellin'" when asked      Exercises General Exercises - Lower Extremity Long Arc Quad: PROM;Both;10 reps;Seated Hip Flexion/Marching: PROM;Both;Seated;10 reps    General Comments        Pertinent Vitals/Pain Pain Assessment: Faces Faces Pain Scale: No hurt    Home Living                      Prior Function            PT Goals (current goals can now be found in the care plan section) Acute Rehab PT Goals Patient Stated Goal: none stated today, previous to go home with family PT Goal Formulation: Patient unable to participate in goal setting Time For Goal Achievement: 03/13/20 Potential to Achieve Goals: Fair Progress towards PT goals: Goals downgraded-see care plan    Frequency    Min 3X/week      PT Plan Discharge plan needs to be updated    Co-evaluation PT/OT/SLP Co-Evaluation/Treatment: Yes Reason for Co-Treatment: Complexity of the patient's impairments (multi-system involvement) PT goals addressed during session: Mobility/safety with mobility OT goals addressed during session: ADL's and self-care      AM-PAC PT "6 Clicks" Mobility   Outcome Measure  Help needed turning from your back to your side while in a flat bed without using bedrails?: A Lot Help needed moving from lying on your back to sitting on the side of a flat bed without using bedrails?: Total Help needed moving to and from a bed to a chair (including a wheelchair)?: Total Help needed standing up from a chair using your arms (e.g., wheelchair or bedside  chair)?: Total Help needed to walk in hospital room?: Total Help needed climbing 3-5 steps with a railing? : Total 6 Click Score: 7    End of Session   Activity Tolerance: Other (comment)(limited by BP and cognition) Patient left: in bed;with call bell/phone within reach;with bed alarm set Nurse Communication: Mobility status;Precautions PT Visit Diagnosis: Other abnormalities of gait and mobility (R26.89);Pain;Other symptoms and signs involving the nervous system (R29.898);Difficulty in walking, not elsewhere classified (R26.2)     Time: [id number] PT Time Calculation (min) (ACUTE ONLY): 26 min  Charges:                        Bayard Males, PT Acute Rehabilitation Services Pager: [phone] Office: [phone]    Caydn Justen B Delon Revelo 02/28/2020, 1:49 PM

## 2020-02-28 NOTE — Progress Notes (Signed)
0900 Pt able to verbalize urge to void.  Bed to chair position with bedpan. Pt alert and brushing her own teeth. Attempting to communicate with family. Follows commands with cueing, but still appears to have some receptive aphasia and word finding. Quickly asleep with change of bed position. No void/BM  1000 Pt again alert working with PT/OT (see note)  1800 Pt transferred to 4N ICU Bladder scan of 500cc; pt unable to follow directions to attempts spontaneous void

## 2020-02-29 ENCOUNTER — Inpatient Hospital Stay (HOSPITAL_COMMUNITY): Payer: Medicare HMO

## 2020-02-29 DIAGNOSIS — I615 Nontraumatic intracerebral hemorrhage, intraventricular: Secondary | ICD-10-CM | POA: Diagnosis not present

## 2020-02-29 DIAGNOSIS — I4892 Unspecified atrial flutter: Secondary | ICD-10-CM | POA: Diagnosis not present

## 2020-02-29 DIAGNOSIS — E854 Organ-limited amyloidosis: Secondary | ICD-10-CM | POA: Diagnosis not present

## 2020-02-29 DIAGNOSIS — R1312 Dysphagia, oropharyngeal phase: Secondary | ICD-10-CM | POA: Diagnosis not present

## 2020-02-29 LAB — TSH: TSH: 5.311 u[IU]/mL — ABNORMAL HIGH (ref 0.350–4.500)

## 2020-02-29 LAB — HEPATIC FUNCTION PANEL
ALT: 46 U/L — ABNORMAL HIGH (ref 0–44)
AST: 26 U/L (ref 15–41)
Albumin: 2.9 g/dL — ABNORMAL LOW (ref 3.5–5.0)
Alkaline Phosphatase: 97 U/L (ref 38–126)
Bilirubin, Direct: 0.1 mg/dL (ref 0.0–0.2)
Indirect Bilirubin: 0.4 mg/dL (ref 0.3–0.9)
Total Bilirubin: 0.5 mg/dL (ref 0.3–1.2)
Total Protein: 5.4 g/dL — ABNORMAL LOW (ref 6.5–8.1)

## 2020-02-29 LAB — GLUCOSE, CAPILLARY
Glucose-Capillary: 142 mg/dL — ABNORMAL HIGH (ref 70–99)
Glucose-Capillary: 157 mg/dL — ABNORMAL HIGH (ref 70–99)
Glucose-Capillary: 158 mg/dL — ABNORMAL HIGH (ref 70–99)
Glucose-Capillary: 161 mg/dL — ABNORMAL HIGH (ref 70–99)
Glucose-Capillary: 177 mg/dL — ABNORMAL HIGH (ref 70–99)
Glucose-Capillary: 191 mg/dL — ABNORMAL HIGH (ref 70–99)

## 2020-02-29 LAB — CBC
HCT: 27.5 % — ABNORMAL LOW (ref 36.0–46.0)
Hemoglobin: 8.6 g/dL — ABNORMAL LOW (ref 12.0–15.0)
MCH: 29.9 pg (ref 26.0–34.0)
MCHC: 31.3 g/dL (ref 30.0–36.0)
MCV: 95.5 fL (ref 80.0–100.0)
Platelets: 139 10*3/uL — ABNORMAL LOW (ref 150–400)
RBC: 2.88 MIL/uL — ABNORMAL LOW (ref 3.87–5.11)
RDW: 17.7 % — ABNORMAL HIGH (ref 11.5–15.5)
WBC: 13.6 10*3/uL — ABNORMAL HIGH (ref 4.0–10.5)
nRBC: 0 % (ref 0.0–0.2)

## 2020-02-29 LAB — APTT
aPTT: 58 seconds — ABNORMAL HIGH (ref 24–36)
aPTT: 63 seconds — ABNORMAL HIGH (ref 24–36)
aPTT: 53 seconds — ABNORMAL HIGH (ref 24–36)
aPTT: 52 seconds — ABNORMAL HIGH (ref 24–36)
aPTT: 47 seconds — ABNORMAL HIGH (ref 24–36)
aPTT: 68 seconds — ABNORMAL HIGH (ref 24–36)

## 2020-02-29 LAB — SODIUM
Sodium: 158 mmol/L — ABNORMAL HIGH (ref 135–145)
Sodium: 156 mmol/L — ABNORMAL HIGH (ref 135–145)
Sodium: 159 mmol/L — ABNORMAL HIGH (ref 135–145)

## 2020-02-29 LAB — BASIC METABOLIC PANEL
BUN: 24 mg/dL — ABNORMAL HIGH (ref 8–23)
CO2: 16 mmol/L — ABNORMAL LOW (ref 22–32)
Calcium: 9.3 mg/dL (ref 8.9–10.3)
Chloride: >130 mmol/L (ref 98–111)
Creatinine, Ser: 0.83 mg/dL (ref 0.44–1.00)
GFR calc Af Amer: >60 mL/min (ref >60)
GFR calc non Af Amer: >60 mL/min (ref >60)
Glucose, Bld: 188 mg/dL — ABNORMAL HIGH (ref 70–99)
Potassium: 3.6 mmol/L (ref 3.5–5.1)
Sodium: 156 mmol/L — ABNORMAL HIGH (ref 135–145)

## 2020-02-29 LAB — PHOSPHORUS
Phosphorus: 2.4 mg/dL — ABNORMAL LOW (ref 2.5–4.6)
Phosphorus: 2.8 mg/dL (ref 2.5–4.6)

## 2020-02-29 LAB — TRIGLYCERIDES: Triglycerides: 96 mg/dL (ref <150)

## 2020-02-29 LAB — MAGNESIUM
Magnesium: 2 mg/dL (ref 1.7–2.4)
Magnesium: 2.1 mg/dL (ref 1.7–2.4)

## 2020-02-29 MED ORDER — LORAZEPAM 2 MG/ML IJ SOLN
2.0000 mg | Freq: Once | INTRAMUSCULAR | Status: AC
  Administered 2020-02-29: 2 mg via INTRAVENOUS

## 2020-02-29 MED ORDER — OSMOLITE 1.2 CAL PO LIQD
1000.0000 mL | ORAL | Status: DC
  Administered 2020-02-29 – 2020-03-06 (×6): 1000 mL
  Filled 2020-02-29 (×9): qty 1000

## 2020-02-29 MED ORDER — SODIUM CHLORIDE 0.9 % IV SOLN: INTRAVENOUS | Status: DC | PRN

## 2020-02-29 MED ORDER — SODIUM CHLORIDE 0.9 % IV SOLN: INTRAVENOUS | Status: DC

## 2020-02-29 MED ORDER — OXYCODONE HCL 5 MG/5ML PO SOLN
5.0000 mg | Freq: Four times a day (QID) | ORAL | Status: DC | PRN
  Administered 2020-02-29 – 2020-03-02 (×6): 5 mg
  Filled 2020-02-29 (×6): qty 5

## 2020-02-29 MED ORDER — OXYCODONE HCL 5 MG/5ML PO SOLN
5.0000 mg | Freq: Once | ORAL | Status: AC
  Administered 2020-02-29: 5 mg
  Filled 2020-02-29: qty 5

## 2020-02-29 MED ORDER — LOPERAMIDE HCL 1 MG/7.5ML PO SUSP
4.0000 mg | Freq: Four times a day (QID) | ORAL | Status: DC | PRN
  Administered 2020-02-29: 4 mg
  Filled 2020-02-29: qty 30

## 2020-02-29 MED ORDER — HYDRALAZINE HCL 50 MG PO TABS
100.0000 mg | ORAL_TABLET | Freq: Three times a day (TID) | ORAL | Status: DC
  Administered 2020-02-29 – 2020-03-10 (×27): 100 mg
  Filled 2020-02-29 (×30): qty 2

## 2020-02-29 MED ORDER — LORAZEPAM 2 MG/ML IJ SOLN
1.0000 mg | Freq: Four times a day (QID) | INTRAMUSCULAR | Status: DC | PRN
  Administered 2020-02-29: 19:00:00 0.5 mg via INTRAVENOUS
  Administered 2020-03-01 – 2020-03-06 (×9): 1 mg via INTRAVENOUS
  Filled 2020-02-29 (×10): qty 1

## 2020-02-29 MED ORDER — VITAL AF 1.2 CAL PO LIQD
1000.0000 mL | ORAL | Status: DC
  Administered 2020-02-29: 09:00:00 1000 mL

## 2020-02-29 MED ORDER — PRO-STAT SUGAR FREE PO LIQD
30.0000 mL | Freq: Every day | ORAL | Status: DC
  Administered 2020-03-01 – 2020-03-10 (×10): 30 mL
  Filled 2020-02-29 (×10): qty 30

## 2020-02-29 MED ORDER — FREE WATER
100.0000 mL | Status: DC
  Administered 2020-02-29 – 2020-03-02 (×10): 100 mL

## 2020-02-29 MED ORDER — LORAZEPAM BOLUS VIA INFUSION: 2.0000 mg | Freq: Once | INTRAVENOUS | Status: DC

## 2020-02-29 MED ORDER — LOPERAMIDE HCL 1 MG/7.5ML PO SUSP
4.0000 mg | Freq: Four times a day (QID) | ORAL | Status: DC | PRN
  Filled 2020-02-29: qty 30

## 2020-02-29 MED ORDER — LORAZEPAM 2 MG/ML IJ SOLN
INTRAMUSCULAR | Status: AC
  Filled 2020-02-29: qty 1

## 2020-02-29 MED ORDER — SODIUM CHLORIDE 0.9 % IV SOLN
INTRAVENOUS | Status: DC | PRN
  Administered 2020-02-29: 05:00:00 500 mL via INTRAVENOUS

## 2020-02-29 NOTE — Progress Notes (Signed)
STROKE TEAM PROGRESS NOTE   INTERVAL HISTORY Husband at the bedside.  Patient again drowsy sleepy not open eyes, restless, moaning.  On wrist restraint.  However, moving all extremities.  Only says "stop it" but not follow any commands.  Well treated with oxycodone and Ativan.  Will repeat MRI and MRV.  Vitals:   02/29/20 0630 02/29/20 0645 02/29/20 0700 02/29/20 0800  BP: (!) 152/87 (!) 154/71 (!) 147/95   Pulse: 100 92 100   Resp: (!) 22 18 (!) 22   Temp:    98.3 F (36.8 C)  TempSrc:      SpO2: 100% 97% 100%   Weight:      Height:       CBC:  Recent Labs  Lab 02/25/20 0218 02/25/20 0218 02/26/20 0259 02/26/20 0818 02/28/20 0416 02/29/20 0506  WBC 10.6*   < > 11.2*   < > 14.0* 13.6*  NEUTROABS 9.1*  --  9.0*  --   --   --   HGB 11.6*   < > 10.6*   < > 9.8* 8.6*  HCT 35.0*   < > 32.5*   < > 30.5* 27.5*  MCV 90.7   < > 92.9   < > 94.1 95.5  PLT 23*   < > 38*   < > 100* 139*   < > = values in this interval not displayed.   Basic Metabolic Panel:  Recent Labs  Lab 02/28/20 0416 02/28/20 0920 02/28/20 1550 02/28/20 1550 02/28/20 2127 02/29/20 0506  NA 147*   < > 149*   < > 153* 156*  K 3.0*  --   --   --   --  3.6  CL 116*  --   --   --   --  >130*  CO2 16*  --   --   --   --  16*  GLUCOSE 149*  --   --   --   --  188*  BUN 12  --   --   --   --  24*  CREATININE 0.81  --   --   --   --  0.83  CALCIUM 9.1  --   --   --   --  9.3  MG  --    < > 2.1  --   --  2.0  PHOS  --    < > 2.4*  --   --  2.4*   < > = values in this interval not displayed.   Lipid Panel:     Component Value Date/Time   CHOL 172 02/14/2020 0959   TRIG 96 02/29/2020 0506   HDL 71 02/14/2020 0959   CHOLHDL 2.4 02/14/2020 0959   VLDL 9 02/14/2020 0959   LDLCALC 92 02/14/2020 0959   HgbA1c:  Lab Results  Component Value Date   HGBA1C 6.1 (H) 02/14/2020    IMAGING past 24 hours CT HEAD WO CONTRAST  Result Date: 02/28/2020 CLINICAL DATA:  Follow-up cerebral hemorrhage and dural  venous sinus thrombosis. EXAM: CT HEAD WITHOUT CONTRAST TECHNIQUE: Contiguous axial images were obtained from the base of the skull through the vertex without intravenous contrast. COMPARISON:  02/26/2020 FINDINGS: Brain: Left greater than right temporo-occipital edema has improved with a small discrete subacute cortical infarct again noted laterally in the right temporo-occipital region. No new cortically based infarct is identified. Hemorrhage in the right greater than left lateral ventricles demonstrates slight redistribution but has not significantly changed in volume. The  ventricles are unchanged in size including slight dilatation of the right temporal horn. There is no midline shift or extra-axial fluid collection. Mild left cerebellar edema is less conspicuous. A background of diffuse hypoattenuation throughout the cerebral white matter is again noted and nonspecific but compatible with severe chronic small vessel ischemic disease. There is no midline shift or extra-axial fluid collection. Vascular: Similar appearance of hyperdensity along the internal cerebral veins, vein of Galen, straight sinus, and left transverse sinus corresponding to known thrombosis. Calcified atherosclerosis at the skull base. Skull: No fracture or suspicious osseous lesion. Sinuses/Orbits: Partially visualized bubbly fluid in the right maxillary sinus. Clear mastoid air cells. Unremarkable orbits. Other: None. IMPRESSION: 1. Decreased left greater than right temporo-occipital edema. 2. Unchanged intraventricular hemorrhage. No hydrocephalus. 3. Known venous sinus thrombosis. 4. No new intracranial abnormality identified. Electronically Signed   By: Logan Bores M.D.   On: 02/28/2020 13:48     PHYSICAL EXAM  General - Well nourished, well developed, lethargic  Ophthalmologic - fundi not visualized due to noncooperation.  Cardiovascular - Regular rhythm and rate, not in afib.  Neuro - lethargic, drowsy and sleepy, not open  eyes on voice or pain, only says "stop it", not following commands. She resisted eye opening and difficult to exam pupils, facial symmetrical. BUE against gravity, symmetrical. BLE 2+/5 withdraw to pain, symmetrical. DTR 1+, no babinski. Sensation, coordination not cooperative and gait not tested.   ASSESSMENT/PLAN Ms. CHARIYA DEOLIVEIRA is a 74 y.o. female with history of HTN presenting with severe persistent HA accompanied by nausea and vomiting. This has now improved.   AMS  Restless and moaning in bed  Could be related to headache   Repeat MRI and MRV  Oxycodone x 1  Ativan PRN  IVH - right lateral ventricle IVH secondary to ? CAA vs. HTN  CT head 4/5 0032 R lateral IVH. Severe chronic ischemic microangiopathy.  MRI 4/5 Stable IVH, severe leukoaraiosis. Multiple foci throughout progressed from previous MRI d/t microangiopathy vs amyloid.  MRA unremarkable   CT head repeat 4/5-4/10 - unchanged size and configuration of right IVH  CT Head 02/26/20 - Increased edema in the left greater than right temporal and occipital lobes which may reflect worsening edema from venous hypertension and venous infarcts.   CT head 02/28/20 decreased L>R edema. IVH same. Known SVT.    EEG - moderate diffuse encephalopathy  2D Echo EF 65-70%. No source of embolus   LDL 92   HgbA1c 6.1   DVT prophylaxis - argatroban IV   aspirin 81 mg daily prior to admission, now on argatroban due to HIT -:Plan AC for at least 6 mos. Switch to DOAC at d/c  Therapy recommendations:  CIR  Disposition:  pending  Pancytosis due to polycythemia vera   WBC 12->24.4->17.9->11.2->13.0->14.0->13.6 (afebrile)  Hgb 17.1->18.6->16.2->13.4->10.6->10.5->9.8->8.6   PLT 990->738->548->324->160->23->38->41->51->100->139 (improving)  Dr. Ron Agee on board   Bone marrow bx done, results no leukemia, consistent with polycythemia vera   Treated with hydrea and allopurinol, now off  S/p Phlebotomy and pheresis x  3  Erythropoietin level 1.3 (L)  JAK2 positive for V617F mutation  Further work-up as per Dr. Ron Agee as outpatient  HIT syndrome  B/l PE cerebral venous sinus thrombosis s/p IR  4/13 New SOB and tachycardia  CTA chest - BLE PE.   Filter placement on 4/13 as unable to get [facility] d/t IVH  4/15 CT head - straight sinus and vein of Galan CSVT  CTV Confirmed extensive thrombus within the  confluence of sinuses and straight sinus, also extending into the vein of Galen. There is also fairly extensive multifocal thrombus within the right greater than left transverse and sigmoid dural venous sinuses.  S/p mechanical thrombectomy 02/24/20 of occluded deep cerebral veins  MRI 02/25/2020 shows multiple by cerebral and cerebellar infarcts as well as right temporal cortical venous infarct and cytotoxic edema  MRV persistent occlusion of 3 sinus, vein of Galan, right IJ, right basal vein of Rosenthal  CT head 4/17 increase edema in the left greater than right temporal and occipital lobes  CT head 4/19 decreased L>R edema. IVH same. Known SVT.  PLT 990->738->691->669->548->324->160->23->38->41->51->100 (improving)  HIT antibody positive  On argatroban IV -> continue, may switch to DOAC later. AC at least 6 months  Decadron 10mg  x 1 -> 4mg  q6  3% saline - started 02/27/20 at 75cc/h ->4/20 NS@50    Na Q 6 hrs  Na goal 150 - 155  Na 142->155->147->147->149->153->156   Headache, due to IVH and CSVT  Depakote 500 mg nightly discontinued  Pt now has Fioricet one Q8 hrs prn as well as tylenol prn  Solumedrol 500 mg -> prednisone -> off  NS at 100 cc's / hr started 4/10 -> 3% saline  Add topamax 25 bid  Decadron started 10 mg IV x 1 then 4 mg IV Q 6 hrs  CT head 4/17 increase edema in the left greater than right temporal and occipital lobes  CT head 4/19 decreased L>R edema. IVH same. Known SVT.  MRI and MRV pending 4/20  Afib RVR, new diagnosis  Rate in 160's -> now back to  sinus  Diltizem gtt discontinued  On amiodarone  Continue amiodarone through acute illness and stop when safely on AC w/ DOAC  TSH 5.311    OK with AC for 6 mos only due to acute illness and no recurrence of afib. Switch to DOAC at d/c  Cardiology signed off 02/26/20  Dysphagia  NPO status due to altered mental status  cortrak placed under fluoro 02/26/20 -> pt pulled off same day  cortrak replaced 4/19  On TF @ 20cc->40cc  SLP onboard  Hypertension  Home meds:  losartan 100, amlodipine 5  On losartan 50 bid and amlodipine 10 and hydralazine 50 tid . SBP goal < 140  off Cleviprex now   BP Stable  . 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 Multiple foci throughout progressed from previous MRI d/t microangiopathy vs amyloid.  As per husband, pt has some anomia as baseline  BP goal < 140  Hyperlipidemia  Home meds:  lipitor 10  LDL 92  Statin held in setting of acute ICH  Consider continuation of statin at discharge  Hypokalemia and Hypomagnesemia and Hypophosphatemia  Potassium 3.0->2.8->3.0->3.0->3.6 - supplement   Magnesium 1.6 - 2.2->1.8->2.3->2.2->2.1->2.0  Phosphorus 2.3->2.6->2.4->2.4   Other Stroke Risk Factors  Advanced age  Former Cigarette smoker  Family hx stroke (mother, father, and multiple other family members)  Other Active Problems  Hepatitis C  Hypothyroid on synthroid. TSH WNL - resume synthroid   Urinary retention, foley placed  Diarrhea with TF - on imodium PRN  Hospital day # 15  This patient is critically ill and at significant risk of neurological worsening, death and care requires constant monitoring of vital signs, hemodynamics,respiratory and cardiac monitoring, extensive review of multiple databases,  frequent neurological assessment,  discussion with family, other specialists and medical decision making of high complexity. I spent 40 minutes of neurocritical care time  in the care of  this patient. I had long discussion with husband at bedside, updated pt current condition, treatment plan and potential prognosis, and answered all the questions.  He expressed understanding and appreciation.   Rosalin Hawking, MD PhD Stroke Neurology 02/29/2020 10:13 AM   To contact Stroke Continuity provider, please refer to [email or web address] After hours, contact General Neurology

## 2020-02-29 NOTE — Evaluation (Signed)
Clinical/Bedside Swallow Evaluation Patient Details  Name: [name] MRN: [id number] Date of Birth: 1945/11/19  Today's Date: 02/29/2020 Time: SLP Start Time (ACUTE ONLY): 0840 SLP Stop Time (ACUTE ONLY): 0854 SLP Time Calculation (min) (ACUTE ONLY): 14 min  Past Medical History:  Past Medical History:  Diagnosis Date  . Hepatitis C   . Hyperlipidemia   . Hypertension   . Osteoporosis    Past Surgical History:  Past Surgical History:  Procedure Laterality Date  . IR ANGIO INTRA EXTRACRAN SEL INTERNAL CAROTID BILAT MOD SED  02/24/2020  . IR ANGIO VERTEBRAL SEL VERTEBRAL UNI R MOD SED  02/24/2020  . IR IVC FILTER PLMT / S&I /IMG GUID/MOD SED  02/22/2020  . IR THROMBECT VENO MECH MOD SED  02/24/2020  . IR US GUIDE VASC ACCESS RIGHT  02/24/2020  . IR US GUIDE VASC ACCESS RIGHT  02/24/2020  . RADIOLOGY WITH ANESTHESIA N/A 02/24/2020   Procedure: IR WITH ANESTHESIA;  Surgeon: Radiologist, Medication, MD;  Location: [place];  Service: Radiology;  Laterality: N/A;   HPI:  74 y.o. female with history of HTN presenting with severe persistent HA accompanied by nausea and vomiting. Dx right lateral IVH. Developed acute PE; IVC filter placed 4/13. 4/15 head CT demonstrated deep cerebral thrombosis s/p IR for thrombectomy with expressive aphasia after. 4/16 MRI showed multiple cerebral and cerebellar infarcts, Rt temporal infarct with cytotoxic edema and hypertonic saline started.   Assessment / Plan / Recommendation Clinical Impression   Pt very lethargic/drowsy for eval. Pt was provided Max multimodal cues for arousal and stimulation (repostioning, lights on, wet wash cloth, oral care), but unfortunately the pt could not be aroused. She required frequent repositioning throughout the session, as she constantly leans to the right. During oral care, she pursed her lips, clenched her teeth and would not open her mouth/teeth for oral care. Recommend continuing with NPO at this time; pt already has a  Cortrak in place. Will continue to follow for PO readiness and re-evaluation of cognition as appropriate.   SLP Visit Diagnosis: Dysphagia, unspecified (R13.10)    Aspiration Risk       Diet Recommendation NPO   Medication Administration: Via alternative means    Other  Recommendations Oral Care Recommendations: Oral care BID   Follow up Recommendations Inpatient Rehab      Frequency and Duration min 2x/week  2 weeks       Prognosis Prognosis for Safe Diet Advancement: Fair Barriers to Reach Goals: Cognitive deficits;Severity of deficits      Swallow Study   General HPI: 74 y.o. female with history of HTN presenting with severe persistent HA accompanied by nausea and vomiting. Dx right lateral IVH. Developed acute PE; IVC filter placed 4/13. 4/15 head CT demonstrated deep cerebral thrombosis s/p IR for thrombectomy with expressive aphasia after. 4/16 MRI showed multiple cerebral and cerebellar infarcts, Rt temporal infarct with cytotoxic edema and hypertonic saline started. Type of Study: Bedside Swallow Evaluation Previous Swallow Assessment: none in chart Diet Prior to this Study: NPO Temperature Spikes Noted: No Respiratory Status: Room air History of Recent Intubation: Yes Length of Intubations (days): 1 days Date extubated: 02/24/20 Behavior/Cognition: Lethargic/Drowsy Oral Cavity Assessment: Other (comment)(clenching teeth/lips) Oral Care Completed by SLP: Yes Oral Cavity - Dentition: Adequate natural dentition    Oral/Motor/Sensory Function Overall Oral Motor/Sensory Function: Other (comment)(unable to complete d/t lethargy)   Ice Chips Ice chips: Not tested   Thin Liquid Thin Liquid: Not tested    Nectar Thick Nectar  Thick Liquid: Not tested   Honey Thick Honey Thick Liquid: Not tested   Puree Puree: Not tested   Solid     Solid: Not tested     [name], Student SLP Office: [phone]  02/29/2020,9:31 AM

## 2020-02-29 NOTE — Progress Notes (Signed)
[place] for argatroban Indication: HIT  Allergies  Allergen Reactions  . Erythromycin Itching and Other (See Comments)    Severe stomach pains, diarrhea  . Heparin     Likely HIT    Patient Measurements: Height: 5\' 2"  (157.5 cm) Weight: 72.9 kg (160 lb 11.5 oz) IBW/kg (Calculated) : 50.1  Vital Signs: Temp: 98.7 F (37.1 C) (04/20 1200) Temp Source: Axillary (04/20 1200) BP: 135/67 (04/20 1300) Pulse Rate: 85 (04/20 1300)  Labs: Recent Labs    02/27/20 0221 02/27/20 1846 02/28/20 0416 02/28/20 1748 02/29/20 0506 02/29/20 0926 02/29/20 1300  HGB 10.5*  --  9.8*  --  8.6*  --   --   HCT 32.0*  --  30.5*  --  27.5*  --   --   PLT 51*  --  100*  --  139*  --   --   APTT 59*   < > 56*   < > 63* 53* 52*  CREATININE 0.72  --  0.81  --  0.83  --   --    < > = values in this interval not displayed.    Estimated Creatinine Clearance: 56.4 mL/min (by C-G formula based on SCr of 0.83 mg/dL).  Assessment: 16 f initially admitted 4/4 with acute IVH and thrombocythemia with PLTC of 990k. Patient was ultimately diagnosed with polythemia, now s/p leukophoresis, therapeutic phlebotomy, and initiation of hydroxyurea. On 4/13 - found to have B/L PE - decision was risk of AC was too great and IVC placed. CT scan 4/15 showed extensive thrombus within confluence of sinuses and sent to IR with mechanical thrombectomy x5.  Of note, plt on 4/4 990>>4/13 160>>4/15 26 and now 23 4/16. DIC panel sent with DDimer > 20, fibrinogen 203. Heparin antibody OD 2.55 with 4T score ~6-8 (high likelihood). Patient received 3 doses of heparin during pheresis and no other heparin documented.   Discussed with Dr Shelba Flake (oncology) to initiate argatroban to treat HIT associated clots (since risk of further clotting may be worse than risk of re-bleed), though we will have to be careful and target the low end of therapeutic range due to recent IVH. Normally, once  therapeutic we would do daily aPTT - in this case, may need to stick with standard q12h aPTT with therapeutic levels to ensure we do not inadvertently overshoot and get higher than desired levels  APTT now 52 - within goal x 2   Goal of Therapy:  aPTT 50 - 60 seconds Monitor platelets by anticoagulation protocol: Yes   Plan: Continue argatroban 0.25 mcg/kg/min Recheck aPTT at 1700 and will resume q12h checks if remains within goal  Barth Kirks, PharmD, BCPS, BCCCP Clinical Pharmacist [phone]  Please check AMION for all [place] numbers  02/29/2020 2:17 PM

## 2020-02-29 NOTE — Progress Notes (Signed)
[name] for argatroban Indication: HIT  Allergies  Allergen Reactions  . Erythromycin Itching and Other (See Comments)    Severe stomach pains, diarrhea  . Heparin     Likely HIT    Patient Measurements: Height: 5\' 2"  (157.5 cm) Weight: 72.9 kg (160 lb 11.5 oz) IBW/kg (Calculated) : 50.1  Vital Signs: Temp: 97.6 F (36.4 C) (04/20 0000) Temp Source: Axillary (04/20 0000) BP: 149/64 (04/20 0200) Pulse Rate: 99 (04/20 0200)  Labs: Recent Labs    02/26/20 0259 02/26/20 0259 02/26/20 0818 02/26/20 1549 02/27/20 0221 02/27/20 1846 02/28/20 0416 02/28/20 0416 02/28/20 1748 02/28/20 2127 02/29/20 0102  HGB 10.6*   < > 10.6*  --  10.5*  --  9.8*  --   --   --   --   HCT 32.5*   < > 32.4*  --  32.0*  --  30.5*  --   --   --   --   PLT 38*   < > 41*  --  51*  --  100*  --   --   --   --   APTT 62*   < > 61*   < > 59*   < > 56*   < > 46* 45* 58*  CREATININE 0.80  --   --   --  0.72  --  0.81  --   --   --   --    < > = values in this interval not displayed.    Estimated Creatinine Clearance: 57.8 mL/min (by C-G formula based on SCr of 0.81 mg/dL).  Assessment: 51 f initially admitted 4/4 with acute IVH and thrombocythemia with PLTC of 990k. Patient was ultimately diagnosed with polythemia, now s/p leukophoresis, therapeutic phlebotomy, and initiation of hydroxyurea. On 4/13 - found to have B/L PE - decision was risk of AC was too great and IVC placed. CT scan 4/15 showed extensive thrombus within confluence of sinuses and sent to IR with mechanical thrombectomy x5.  Of note, plt on 4/4 990>>4/13 160>>4/15 26 and now 23 4/16. DIC panel sent with DDimer > 20, fibrinogen 203. Heparin antibody OD 2.55 with 4T score ~6-8 (high likelihood). Patient received 3 doses of heparin during pheresis and no other heparin documented.   Discussed with Dr Shelba Flake (oncology) to initiate argatroban to treat HIT associated clots (since risk of further  clotting may be worse than risk of re-bleed), though we will have to be careful and target the low end of therapeutic range due to recent IVH. Normally, once therapeutic we would do daily aPTT - in this case, may need to stick with standard q12h aPTT with therapeutic levels to ensure we do not inadvertently overshoot and get higher than desired levels   AM Update: aPTT is therapeutic at 58 (goal 50-60) on 0.27 mcg/kg/min.  No bleeding or issues noted. Plts up to 100 with AM labs yesterday.   Goal of Therapy:  aPTT 50 - 60 seconds Monitor platelets by anticoagulation protocol: Yes   Plan: - Continue Argatroban at 0.27 mcg/kg/min - Will follow-up aPTT and CBC at 5 AM.   Thank you for allowing pharmacy to be a part of this patient's care.  Sloan Leiter, PharmD, BCPS, BCCCP Clinical Pharmacist Please refer to [facility] for [place] numbers 02/29/2020 2:21 AM

## 2020-02-29 NOTE — Progress Notes (Signed)
Patient became very restless and ripped out right forearm IV in the process. Upon further assessment, patient had 546mL in her bladder. Dr. Cheral Marker was paged because earlier on day shift it took 7 people to hold her down to I&O cath her. Patient is aphagic and didn't understand the procedure. New order to give patient 2mg  of ativan before placing a foley.

## 2020-02-29 NOTE — Progress Notes (Signed)
Nutrition Follow-up  DOCUMENTATION CODES:   Not applicable  INTERVENTION:   Tube feeding:  -Osmolite 1.2 @ 60 ml/hr via Cortrak -30 ml Prostat daily  Provides: 1828 kcals, 95 grams protein, 1167 ml free water.    NUTRITION DIAGNOSIS:   Inadequate oral intake related to inability to eat as evidenced by NPO status. Ongoing.   GOAL:   Patient will meet greater than or equal to 90% of their needs Progressing.   MONITOR:   Weight trends, Labs, I & O's, Diet advancement, Skin, TF tolerance  REASON FOR ASSESSMENT:   Consult Enteral/tube feeding initiation and management  ASSESSMENT:   Patient with PMH significant for HTN, HLD, and Hepatitis C. Presents this admission with right lateral ventricle IVH secondary to HTN.   Pt discussed during ICU rounds and with RN.  Pt out of room for MRI.  Pt started TF on day 14 of hospitalization. Electrolytes are being monitored and replaced.    4/4 admitted for IVH secondary to HTN dx with polycythemia vera  4/13 respiratory distress secondary to acute bilateral emboli s/p IVC filter placement 4/15 s/p mechanical thrombectomy  4/17 small bore feeding tube place in IR but pulled out the same day 4/18 pt started on cleveprix and 3% due to worsening edema  4/19 cortrak placed 4/20 pt failed swallow eval due to lethargy   Weight history limited over the last year. Use 66.3 kg as EDW.   Cleviprex stopped Medications: decadron, senokot Labs: Na 158 (H) - previously on 3% hypertonic saline,Cl: > 130 (H), PO4: 2.4 (L) CBG 158-177-161  TF: Vital AF 1.2 @ 40 ml/hr with 60 ml Prostat BID Provides: 1552 kcal and 132 grams protein  Diet Order:   Diet Order            Diet NPO time specified  Diet effective now              EDUCATION NEEDS:   Not appropriate for education at this time  Skin:  Skin Assessment: Skin Integrity Issues: Skin Integrity Issues:: Incisions Incisions: coccyx, R knee  Last BM:  4/20 unmeasured x 4,  rectal tube placed  Height:   Ht Readings from Last 1 Encounters:  02/14/20 5\' 2"  (1.575 m)    Weight:   Wt Readings from Last 1 Encounters:  02/27/20 72.9 kg    BMI:  Body mass index is 29.4 kg/m.  Estimated Nutritional Needs:   Kcal:  1700-1900 kcal  Protein:  85-100 grams  Fluid:  >/= 1.7 L/day   Lockie Pares., RD, LDN, CNSC See AMiON for contact information

## 2020-02-29 NOTE — Progress Notes (Signed)
Inpatient Rehabilitation Admissions Coordinator  I await further progress with therapy to assist with planning rehab dispo when appropriate.  Danne Baxter, RN, MSN Rehab Admissions Coordinator [phone] 02/29/2020 1:54 PM

## 2020-02-29 NOTE — Progress Notes (Addendum)
[place] for argatroban Indication: HIT  Allergies  Allergen Reactions  . Erythromycin Itching and Other (See Comments)    Severe stomach pains, diarrhea  . Heparin     Likely HIT    Patient Measurements: Height: 5\' 2"  (157.5 cm) Weight: 72.9 kg (160 lb 11.5 oz) IBW/kg (Calculated) : 50.1  Vital Signs: Temp: 97.3 F (36.3 C) (04/20 1600) Temp Source: Axillary (04/20 1600) BP: 179/64 (04/20 1600) Pulse Rate: 90 (04/20 1600)  Labs: Recent Labs    02/27/20 0221 02/27/20 1846 02/28/20 0416 02/28/20 1748 02/29/20 0506 02/29/20 0506 02/29/20 0926 02/29/20 1300 02/29/20 1607  HGB 10.5*  --  9.8*  --  8.6*  --   --   --   --   HCT 32.0*  --  30.5*  --  27.5*  --   --   --   --   PLT 51*  --  100*  --  139*  --   --   --   --   APTT 59*   < > 56*   < > 63*   < > 53* 52* 47*  CREATININE 0.72  --  0.81  --  0.83  --   --   --   --    < > = values in this interval not displayed.    Estimated Creatinine Clearance: 56.4 mL/min (by C-G formula based on SCr of 0.83 mg/dL).  Assessment: 43 f initially admitted 4/4 with acute IVH and thrombocythemia with PLTC of 990k. Patient was ultimately diagnosed with polythemia, now s/p leukophoresis, therapeutic phlebotomy, and initiation of hydroxyurea. On 4/13 - found to have B/L PE - decision was risk of AC was too great and IVC placed. CT scan 4/15 showed extensive thrombus within confluence of sinuses and sent to IR with mechanical thrombectomy x5.  Of note, plt on 4/4 990>>4/13 160>>4/15 26 and now 23 4/16. DIC panel sent with DDimer > 20, fibrinogen 203. Heparin antibody OD 2.55 with 4T score ~6-8 (high likelihood). Patient received 3 doses of heparin during pheresis and no other heparin documented.   Discussed with Dr Shelba Flake (oncology) to initiate argatroban to treat HIT associated clots (since risk of further clotting may be worse than risk of re-bleed), though we will have to be careful and  target the low end of therapeutic range due to recent IVH. Normally, once therapeutic we would do daily aPTT - in this case, may need to stick with standard q12h aPTT with therapeutic levels to ensure we do not inadvertently overshoot and get higher than desired levels  APTT now 47 - slightly low  Goal of Therapy:  aPTT 50 - 60 seconds Monitor platelets by anticoagulation protocol: Yes   Plan: Increase argatroban 0.3 mcg/kg/min Recheck aPTT at [place], PharmD, [facility] Clinical Pharmacist Please see AMION for all Pharmacists' Contact Phone Numbers 02/29/2020, 5:31 PM   ADDENDUM:  APTT up to 68 since increase of argatroban to 0.3 mcg/kg/min  Plan:   Decrease argatroban to 0.28 mcg/kg/min Recheck aPTT in 2 hours  Alanda Slim, PharmD, [facility] Clinical Pharmacist Please see AMION for all Pharmacists' Contact Phone Numbers 02/29/2020, 9:34 PM

## 2020-02-29 NOTE — Progress Notes (Addendum)
[place] for argatroban Indication: HIT  Allergies  Allergen Reactions  . Erythromycin Itching and Other (See Comments)    Severe stomach pains, diarrhea  . Heparin     Likely HIT    Patient Measurements: Height: 5\' 2"  (157.5 cm) Weight: 72.9 kg (160 lb 11.5 oz) IBW/kg (Calculated) : 50.1  Vital Signs: Temp: 99.1 F (37.3 C) (04/20 0400) Temp Source: Axillary (04/20 0400) BP: 158/81 (04/20 0600) Pulse Rate: 100 (04/20 0600)  Labs: Recent Labs    02/27/20 0221 02/27/20 1846 02/28/20 0416 02/28/20 1748 02/28/20 2127 02/29/20 0102 02/29/20 0506  HGB 10.5*  --  9.8*  --   --   --  8.6*  HCT 32.0*  --  30.5*  --   --   --  27.5*  PLT 51*  --  100*  --   --   --  139*  APTT 59*   < > 56*   < > 45* 58* 63*  CREATININE 0.72  --  0.81  --   --   --  0.83   < > = values in this interval not displayed.    Estimated Creatinine Clearance: 56.4 mL/min (by C-G formula based on SCr of 0.83 mg/dL).  Assessment: 62 f initially admitted 4/4 with acute IVH and thrombocythemia with PLTC of 990k. Patient was ultimately diagnosed with polythemia, now s/p leukophoresis, therapeutic phlebotomy, and initiation of hydroxyurea. On 4/13 - found to have B/L PE - decision was risk of AC was too great and IVC placed. CT scan 4/15 showed extensive thrombus within confluence of sinuses and sent to IR with mechanical thrombectomy x5.  Of note, plt on 4/4 990>>4/13 160>>4/15 26 and now 23 4/16. DIC panel sent with DDimer > 20, fibrinogen 203. Heparin antibody OD 2.55 with 4T score ~6-8 (high likelihood). Patient received 3 doses of heparin during pheresis and no other heparin documented.   Discussed with Dr Shelba Flake (oncology) to initiate argatroban to treat HIT associated clots (since risk of further clotting may be worse than risk of re-bleed), though we will have to be careful and target the low end of therapeutic range due to recent IVH. Normally, once  therapeutic we would do daily aPTT - in this case, may need to stick with standard q12h aPTT with therapeutic levels to ensure we do not inadvertently overshoot and get higher than desired levels  aPTT this morning ws 63 on argatroban 0.74mcg/kg/min is supratherapeutic (goal 50-60). No reported bleeding or issues with IV infusion. aPTT drawn from central line which is where agatroban is infusing at but per RN paused infusion and flushed line prior to drawing aPTT. Hgb 8.6. Platelets are up to 139.  Goal of Therapy:  aPTT 50 - 60 seconds Monitor platelets by anticoagulation protocol: Yes   Plan: - Decrease argatroban at 0.25 mcg/kg/min - Check aPTT at 0900  Thank you for allowing pharmacy to be a part of this patient's care.  Cristela Felt, PharmD PGY1 Pharmacy Resident [employer]: [phone]  02/29/2020 6:21 AM   Addendum: aPTT of 53 on argatroban 0.48mcg/kg/min is therapeutic (goal 50-60s). Of note, aPTT level was delayed due to first sample being lost. No reported bleeding.   Plan: - Continue argatroban 0.55mcg/kg/min  - Check aPTT at 1430

## 2020-03-01 ENCOUNTER — Encounter (HOSPITAL_COMMUNITY): Payer: Self-pay

## 2020-03-01 DIAGNOSIS — I4892 Unspecified atrial flutter: Secondary | ICD-10-CM | POA: Diagnosis not present

## 2020-03-01 DIAGNOSIS — E854 Organ-limited amyloidosis: Secondary | ICD-10-CM | POA: Diagnosis not present

## 2020-03-01 DIAGNOSIS — I615 Nontraumatic intracerebral hemorrhage, intraventricular: Secondary | ICD-10-CM | POA: Diagnosis not present

## 2020-03-01 DIAGNOSIS — R1312 Dysphagia, oropharyngeal phase: Secondary | ICD-10-CM | POA: Diagnosis not present

## 2020-03-01 LAB — BASIC METABOLIC PANEL
Anion gap: 9 (ref 5–15)
BUN: 32 mg/dL — ABNORMAL HIGH (ref 8–23)
CO2: 18 mmol/L — ABNORMAL LOW (ref 22–32)
Calcium: 9.7 mg/dL (ref 8.9–10.3)
Chloride: 127 mmol/L — ABNORMAL HIGH (ref 98–111)
Creatinine, Ser: 0.88 mg/dL (ref 0.44–1.00)
GFR calc Af Amer: >60 mL/min (ref >60)
GFR calc non Af Amer: >60 mL/min (ref >60)
Glucose, Bld: 243 mg/dL — ABNORMAL HIGH (ref 70–99)
Potassium: 3.2 mmol/L — ABNORMAL LOW (ref 3.5–5.1)
Sodium: 154 mmol/L — ABNORMAL HIGH (ref 135–145)

## 2020-03-01 LAB — GLUCOSE, CAPILLARY
Glucose-Capillary: 150 mg/dL — ABNORMAL HIGH (ref 70–99)
Glucose-Capillary: 175 mg/dL — ABNORMAL HIGH (ref 70–99)
Glucose-Capillary: 186 mg/dL — ABNORMAL HIGH (ref 70–99)
Glucose-Capillary: 189 mg/dL — ABNORMAL HIGH (ref 70–99)
Glucose-Capillary: 208 mg/dL — ABNORMAL HIGH (ref 70–99)
Glucose-Capillary: 211 mg/dL — ABNORMAL HIGH (ref 70–99)

## 2020-03-01 LAB — CBC
HCT: 27.8 % — ABNORMAL LOW (ref 36.0–46.0)
Hemoglobin: 8.7 g/dL — ABNORMAL LOW (ref 12.0–15.0)
MCH: 30.5 pg (ref 26.0–34.0)
MCHC: 31.3 g/dL (ref 30.0–36.0)
MCV: 97.5 fL (ref 80.0–100.0)
Platelets: 95 10*3/uL — ABNORMAL LOW (ref 150–400)
RBC: 2.85 MIL/uL — ABNORMAL LOW (ref 3.87–5.11)
RDW: 18.9 % — ABNORMAL HIGH (ref 11.5–15.5)
WBC: 13.6 10*3/uL — ABNORMAL HIGH (ref 4.0–10.5)
nRBC: 0.1 % (ref 0.0–0.2)

## 2020-03-01 LAB — APTT
aPTT: 53 seconds — ABNORMAL HIGH (ref 24–36)
aPTT: 54 seconds — ABNORMAL HIGH (ref 24–36)
aPTT: 57 seconds — ABNORMAL HIGH (ref 24–36)

## 2020-03-01 LAB — SEROTONIN RELEASE ASSAY (SRA)
SRA .2 IU/mL UFH Ser-aCnc: 6 % (ref 0–20)
SRA 100IU/mL UFH Ser-aCnc: 1 % (ref 0–20)

## 2020-03-01 LAB — T4, FREE: Free T4: 0.67 ng/dL (ref 0.61–1.12)

## 2020-03-01 LAB — SODIUM
Sodium: 157 mmol/L — ABNORMAL HIGH (ref 135–145)
Sodium: 154 mmol/L — ABNORMAL HIGH (ref 135–145)

## 2020-03-01 MED ORDER — POTASSIUM CHLORIDE 20 MEQ/15ML (10%) PO SOLN
40.0000 meq | Freq: Every day | ORAL | Status: DC
  Administered 2020-03-02 – 2020-03-10 (×9): 40 meq
  Filled 2020-03-01 (×9): qty 30

## 2020-03-01 MED ORDER — POTASSIUM CHLORIDE 20 MEQ/15ML (10%) PO SOLN
40.0000 meq | Freq: Two times a day (BID) | ORAL | Status: AC
  Administered 2020-03-01 (×2): 40 meq
  Filled 2020-03-01 (×2): qty 30

## 2020-03-01 MED ORDER — QUETIAPINE FUMARATE 25 MG PO TABS
25.0000 mg | ORAL_TABLET | Freq: Two times a day (BID) | ORAL | Status: DC
  Filled 2020-03-01: qty 1

## 2020-03-01 MED ORDER — INSULIN ASPART 100 UNIT/ML ~~LOC~~ SOLN
0.0000 [IU] | SUBCUTANEOUS | Status: DC
  Administered 2020-03-01 (×2): 3 [IU] via SUBCUTANEOUS
  Administered 2020-03-01: 2 [IU] via SUBCUTANEOUS
  Administered 2020-03-01 – 2020-03-02 (×2): 5 [IU] via SUBCUTANEOUS
  Administered 2020-03-02: 08:00:00 3 [IU] via SUBCUTANEOUS
  Administered 2020-03-02: 5 [IU] via SUBCUTANEOUS
  Administered 2020-03-02: 3 [IU] via SUBCUTANEOUS
  Administered 2020-03-02 (×2): 5 [IU] via SUBCUTANEOUS
  Administered 2020-03-03: 3 [IU] via SUBCUTANEOUS
  Administered 2020-03-03: 5 [IU] via SUBCUTANEOUS
  Administered 2020-03-03: 09:00:00 3 [IU] via SUBCUTANEOUS
  Administered 2020-03-03: 5 [IU] via SUBCUTANEOUS
  Administered 2020-03-03: 2 [IU] via SUBCUTANEOUS
  Administered 2020-03-03: 20:00:00 3 [IU] via SUBCUTANEOUS
  Administered 2020-03-04: 20:00:00 5 [IU] via SUBCUTANEOUS
  Administered 2020-03-04 (×2): 3 [IU] via SUBCUTANEOUS
  Administered 2020-03-04 (×2): 5 [IU] via SUBCUTANEOUS
  Administered 2020-03-04 – 2020-03-05 (×2): 3 [IU] via SUBCUTANEOUS
  Administered 2020-03-05: 5 [IU] via SUBCUTANEOUS
  Administered 2020-03-05 – 2020-03-06 (×4): 3 [IU] via SUBCUTANEOUS
  Administered 2020-03-06 (×2): 5 [IU] via SUBCUTANEOUS

## 2020-03-01 MED ORDER — QUETIAPINE FUMARATE 25 MG PO TABS
25.0000 mg | ORAL_TABLET | Freq: Two times a day (BID) | ORAL | Status: DC
  Administered 2020-03-01 – 2020-03-02 (×2): 25 mg
  Filled 2020-03-01: qty 1

## 2020-03-01 MED ORDER — CHLORHEXIDINE GLUCONATE CLOTH 2 % EX PADS
6.0000 | MEDICATED_PAD | Freq: Every day | CUTANEOUS | Status: DC
  Administered 2020-03-01 – 2020-03-10 (×10): 6 via TOPICAL

## 2020-03-01 NOTE — Progress Notes (Signed)
[place] for argatroban Indication: HIT  Allergies  Allergen Reactions  . Erythromycin Itching and Other (See Comments)    Severe stomach pains, diarrhea  . Heparin     Likely HIT    Patient Measurements: Height: 5\' 2"  (157.5 cm) Weight: 72.9 kg (160 lb 11.5 oz) IBW/kg (Calculated) : 50.1  Vital Signs: Temp: 97.7 F (36.5 C) (04/21 0000) Temp Source: Axillary (04/21 0000) BP: 134/73 (04/21 0030) Pulse Rate: 77 (04/21 0030)  Labs: Recent Labs    02/27/20 0221 02/27/20 1846 02/28/20 0416 02/28/20 1748 02/29/20 0506 02/29/20 0926 02/29/20 1607 02/29/20 2035 02/29/20 2356  HGB 10.5*  --  9.8*  --  8.6*  --   --   --   --   HCT 32.0*  --  30.5*  --  27.5*  --   --   --   --   PLT 51*  --  100*  --  139*  --   --   --   --   APTT 59*   < > 56*   < > 63*   < > 47* 68* 54*  CREATININE 0.72  --  0.81  --  0.83  --   --   --   --    < > = values in this interval not displayed.    Estimated Creatinine Clearance: 56.4 mL/min (by C-G formula based on SCr of 0.83 mg/dL).  Assessment: 74 year old initially admitted 4/4 with acute IVH and thrombocythemia with PLTC of 990k. Patient was ultimately diagnosed with polythemia, now s/p leukophoresis, therapeutic phlebotomy, and initiation of hydroxyurea. On 4/13 - found to have B/L PE - decision was risk of AC was too great and IVC placed. CT scan 4/15 showed extensive thrombus within confluence of sinuses and sent to IR with mechanical thrombectomy x5.  Of note, plt on 4/4 990>>4/13 160>>4/15 26 and now 23 4/16. DIC panel sent with DDimer > 20, fibrinogen 203. Heparin antibody OD 2.55 with 4T score ~6-8 (high likelihood). Patient received 3 doses of heparin during pheresis and no other heparin documented.   Discussed with Dr Shelba Flake (oncology) to initiate argatroban to treat HIT associated clots (since risk of further clotting may be worse than risk of re-bleed), though we will have to be  careful and target the low end of therapeutic range due to recent IVH. Normally, once therapeutic we would do daily aPTT - in this case, may need to stick with standard q12h aPTT with therapeutic levels to ensure we do not inadvertently overshoot and get higher than desired levels  APTT recheck therapeutic at 54 on current rate.   Goal of Therapy:  aPTT 50 - 60 seconds Monitor platelets by anticoagulation protocol: Yes   Plan: Continue argatroban 0.28 mcg/kg/min Follow-up repeat aPTT with AM labs.   Sloan Leiter, PharmD, BCPS, BCCCP Clinical Pharmacist Please refer to [facility] for [place] numbers 03/01/2020, 12:48 AM

## 2020-03-01 NOTE — Progress Notes (Signed)
PT Cancellation Note  Patient Details Name: [name] MRN: [id number] DOB: [birth year]   Cancelled Treatment:    Reason Eval/Treat Not Completed: Other (comment).  Per RN, pt resistive to all attempts at bed mobility, repositioning, not following any commands today. RN, Estill Bamberg, recommended holding as she will not actively participate in therapy today.  PT will check back tomorrow and proceed as able.   Thanks,  Verdene Lennert, PT, DPT  Acute Rehabilitation [phone] pager #[phone] office       Wells Guiles B Youlanda Tomassetti 03/01/2020, 3:00 PM

## 2020-03-01 NOTE — Progress Notes (Signed)
STROKE TEAM PROGRESS NOTE   INTERVAL HISTORY Husband and son at bedside.  Patient still restless and agitated, responding to oxycodone and Ativan so far.  MRI showed left temporal and parietal petechial hemorrhage due to venous thrombosis.  Continue 3% and Decadron.  Add Seroquel for agitation control.  Vitals:   03/01/20 0730 03/01/20 0800 03/01/20 0830 03/01/20 0900  BP: 139/71 (!) 156/84 132/65 137/67  Pulse: 78 93 72 74  Resp: 15 18 15 16   Temp:  97.7 F (36.5 C)    TempSrc:  Axillary    SpO2: 98% 98% 98% 100%  Weight:      Height:       CBC:  Recent Labs  Lab 02/25/20 0218 02/25/20 0218 02/26/20 0259 02/26/20 0818 02/29/20 0506 03/01/20 0458  WBC 10.6*   < > 11.2*   < > 13.6* 13.6*  NEUTROABS 9.1*  --  9.0*  --   --   --   HGB 11.6*   < > 10.6*   < > 8.6* 8.7*  HCT 35.0*   < > 32.5*   < > 27.5* 27.8*  MCV 90.7   < > 92.9   < > 95.5 97.5  PLT 23*   < > 38*   < > 139* 95*   < > = values in this interval not displayed.   Basic Metabolic Panel:  Recent Labs  Lab 02/29/20 0506 02/29/20 0926 02/29/20 1607 02/29/20 1607 02/29/20 2035 03/01/20 0456  NA 156*   < > 156*   < > 159* 154*  K 3.6  --   --   --   --  3.2*  CL >130*  --   --   --   --  127*  CO2 16*  --   --   --   --  18*  GLUCOSE 188*  --   --   --   --  243*  BUN 24*  --   --   --   --  32*  CREATININE 0.83  --   --   --   --  0.88  CALCIUM 9.3  --   --   --   --  9.7  MG 2.0  --  2.1  --   --   --   PHOS 2.4*  --  2.8  --   --   --    < > = values in this interval not displayed.   Lipid Panel:     Component Value Date/Time   CHOL 172 02/14/2020 0959   TRIG 96 02/29/2020 0506   HDL 71 02/14/2020 0959   CHOLHDL 2.4 02/14/2020 0959   VLDL 9 02/14/2020 0959   LDLCALC 92 02/14/2020 0959   HgbA1c:  Lab Results  Component Value Date   HGBA1C 6.1 (H) 02/14/2020    IMAGING past 24 hours MR BRAIN WO CONTRAST  Result Date: 02/29/2020 CLINICAL DATA:  Abnormal head CT. Suspected dural venous  sinus thrombosis. EXAM: MRI HEAD WITHOUT CONTRAST MRV HEAD WITHOUT CONTRAST TECHNIQUE: Multiplanar, multiecho pulse sequences of the brain and surrounding structures were obtained without intravenous contrast. Angiographic images of the intracranial venous structures were obtained using MRV technique without intravenous contrast. COMPARISON:  None. FINDINGS: MRI BRAIN FINDINGS BRAIN: There is unchanged intraventricular hemorrhage within the atrium of the right lateral ventricle. Small amount of layering blood within both occipital horns. There are numerous punctate foci abnormal diffusion restriction within all vascular territories. The largest confluent area is in  the posterior right temporal lobe. Diffuse confluent hyperintense T2-weighted signal within the periventricular, deep and juxtacortical white matter, most commonly due to chronic ischemic microangiopathy. Normal volume of brain parenchyma and CSF spaces. Midline structures are normal. There are numerous chronic microhemorrhages in a predominantly peripheral distribution. SKULL AND UPPER CERVICAL SPINE: Normal calvarium and skull base. Visualized upper cervical spine and soft tissues are normal. SINUSES/ORBITS: No paranasal sinus fluid levels or advanced mucosal thickening. No mastoid or middle ear effusion. Normal orbits. MRV BRAIN FINDINGS Superior sagittal sinus: Normal. Straight sinus: Loss of normal flow related enhancement Inferior sagittal sinus, vein of Galen and internal cerebral veins: Loss of normal flow related enhancement Transverse sinuses: Normal on the right. There is loss of flow related enhancement on the left. Sigmoid sinuses: Normal on the right. Loss of flow related enhancement on the left. Visualized jugular veins: Normal on the right. Diminutive on the left. IMPRESSION: 1. Redemonstration of dural venous sinus thrombosis involving the straight sinus, vein of Galen, internal cerebral veins, left transverse sinus and left sigmoid  sinus. 2. Numerous punctate foci of acute ischemia within all vascular territories in an embolic pattern. 3. Numerous chronic microhemorrhages in a predominantly peripheral distribution, consistent with cerebral amyloid angiopathy. Electronically Signed   By: Ulyses Jarred M.D.   On: 02/29/2020 20:37   MR MRV HEAD WO CM  Result Date: 02/29/2020 CLINICAL DATA:  Abnormal head CT. Suspected dural venous sinus thrombosis. EXAM: MRI HEAD WITHOUT CONTRAST MRV HEAD WITHOUT CONTRAST TECHNIQUE: Multiplanar, multiecho pulse sequences of the brain and surrounding structures were obtained without intravenous contrast. Angiographic images of the intracranial venous structures were obtained using MRV technique without intravenous contrast. COMPARISON:  None. FINDINGS: MRI BRAIN FINDINGS BRAIN: There is unchanged intraventricular hemorrhage within the atrium of the right lateral ventricle. Small amount of layering blood within both occipital horns. There are numerous punctate foci abnormal diffusion restriction within all vascular territories. The largest confluent area is in the posterior right temporal lobe. Diffuse confluent hyperintense T2-weighted signal within the periventricular, deep and juxtacortical white matter, most commonly due to chronic ischemic microangiopathy. Normal volume of brain parenchyma and CSF spaces. Midline structures are normal. There are numerous chronic microhemorrhages in a predominantly peripheral distribution. SKULL AND UPPER CERVICAL SPINE: Normal calvarium and skull base. Visualized upper cervical spine and soft tissues are normal. SINUSES/ORBITS: No paranasal sinus fluid levels or advanced mucosal thickening. No mastoid or middle ear effusion. Normal orbits. MRV BRAIN FINDINGS Superior sagittal sinus: Normal. Straight sinus: Loss of normal flow related enhancement Inferior sagittal sinus, vein of Galen and internal cerebral veins: Loss of normal flow related enhancement Transverse sinuses:  Normal on the right. There is loss of flow related enhancement on the left. Sigmoid sinuses: Normal on the right. Loss of flow related enhancement on the left. Visualized jugular veins: Normal on the right. Diminutive on the left. IMPRESSION: 1. Redemonstration of dural venous sinus thrombosis involving the straight sinus, vein of Galen, internal cerebral veins, left transverse sinus and left sigmoid sinus. 2. Numerous punctate foci of acute ischemia within all vascular territories in an embolic pattern. 3. Numerous chronic microhemorrhages in a predominantly peripheral distribution, consistent with cerebral amyloid angiopathy. Electronically Signed   By: Ulyses Jarred M.D.   On: 02/29/2020 20:37     PHYSICAL EXAM   General - Well nourished, well developed, restless and agitated with exam  Ophthalmologic - fundi not visualized due to noncooperation.  Cardiovascular - Regular rhythm and rate, not in afib.  Neuro -  restless, agitated with exam, but drowsy without stimulation, not open eyes on voice or pain, moaning with stimulation, not following commands. She resisted eye opening and difficult to exam pupils, facial symmetrical. BUE against gravity, symmetrical. BLE 2+/5 withdraw to pain, symmetrical. DTR 1+, no babinski. Sensation, coordination not cooperative and gait not tested.   ASSESSMENT/PLAN Ms. JENNIKA ROSENSTIEL is a 74 y.o. female with history of HTN presenting with severe persistent HA accompanied by nausea and vomiting. This has now improved.   IVH - right lateral ventricle IVH secondary to ? CAA vs. HTN Stroke: multiple punctate B anterior and posterior circulation embolic infarcts in setting of PAF  CT head 4/5 0032 R lateral IVH. Severe chronic ischemic microangiopathy.  MRI 4/5 Stable IVH, severe leukoaraiosis. Multiple foci throughout progressed from previous MRI d/t microangiopathy vs amyloid.  MRA unremarkable   CT head repeat 4/5-4/10 - unchanged size and configuration of  right IVH  CT Head 02/26/20 - Increased edema in the left greater than right temporal and occipital lobes which may reflect worsening edema from venous hypertension and venous infarcts.   CT head 02/28/20 decreased L>R edema. IVH same. Known SVT.    MRI 4/20 - left temporal and parietal significant petechial hemorrhage, likely due to significant CVST. Numerous punctate infarcts throughout brain. Numerous chronic microhemorrhages c/w CAA.  EEG - moderate diffuse encephalopathy  2D Echo EF 65-70%. No source of embolus   LDL 92   HgbA1c 6.1   DVT prophylaxis - argatroban IV   aspirin 81 mg daily prior to admission, now on argatroban due to HIT -:Plan AC for at least 6 mos. Switch to DOAC at d/c  Therapy recommendations:  CIR  Disposition:  pending  Pancytosis due to polycythemia vera   WBC 12->24.4->17.9->11.2->13.0->14.0->13.6->13.6 (afebrile)  Hgb 17.1->18.6->16.2->13.4->10.6->10.5->9.8->8.6->8.7   PLT 990->738->548->324->160->23->38->41->51->100->139->95 (improving)  Dr. 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 Ron Agee as outpatient  HIT syndrome  B/l PE cerebral venous sinus thrombosis s/p IR  4/13 New SOB and tachycardia  CTA chest - BLE PE.   Filter placement on 4/13 as unable to get [facility] d/t IVH  4/15 CT head - straight sinus and vein of Galan CSVT  CTV Confirmed extensive thrombus within the confluence of sinuses and straight sinus, also extending into the vein of Galen. There is also fairly extensive multifocal thrombus within the right greater than left transverse and sigmoid dural venous sinuses.  S/p mechanical thrombectomy 02/24/20 of occluded deep cerebral veins  MRI 02/25/2020 shows multiple by cerebral and cerebellar  infarcts as well as right temporal cortical venous infarct and cytotoxic edema  MRV persistent occlusion of 3 sinus, vein of Galan, right IJ, right basal vein of Rosenthal  CT head 4/17 increase edema in the left greater than right temporal and occipital lobes  CT head 4/19 decreased L>R edema. IVH same. Known SVT.  MRV 4/20 - dural venous sinus unchanged.   MRI 4/20 - left temporal and parietal significant petechial hemorrhage, likely due to significant CVST.   PLT 990->738->691->669->548->324->160->23->38->41->51->100->139->95 (improving)  HIT antibody positive  On argatroban IV -> continue, may switch to DOAC later. AC at least 6 months  Decadron 10mg  x 1 -> 4mg  q6  3% saline - started 02/27/20 at 75cc/h ->4/20 NS@50   Na Q 6 hrs  Na goal 150 - 155  Na 142->155->147->147->149->153->156->159->154->157    Headache, due to IVH and CSVT  Depakote 500 mg nightly discontinued  Pt now has Fioricet one Q8 hrs prn as well as tylenol prn  Solumedrol 500 mg -> prednisone -> off  NS at 100 cc's / hr started 4/10 -> 3% saline  Add topamax 25 bid  Decadron started 10 mg IV x 1 then 4 mg IV Q 6 hrs  CT head 4/17 increase edema in the left greater than right temporal and occipital lobes  CT head 4/19 decreased L>R edema. IVH same. Known SVT.  MRI and MRV4/20 dural venous sinus unchanged. left temporal and parietal significant petechial hemorrhage, likely due to significant CVST.   Oxycodone, Fioricet as needed  Afib RVR, new diagnosis  Rate in 160's -> now back to sinus  Diltizem gtt discontinued  On amiodarone  Continue amiodarone through acute illness and stop when safely on AC w/ DOAC  TSH 5.311  Free T4 normal  OK with AC for 6 mos only due to acute illness and no recurrence of afib. Switch to DOAC at d/c  Cardiology signed off 02/26/20  Dysphagia  NPO status due to altered mental status  cortrak placed under fluoro 02/26/20 -> pt pulled off same  day  cortrak replaced 4/19  On TF @ 60cc  Free water 100 q4h   SLP onboard  Hypertension  Home meds:  losartan 100, amlodipine 5  On losartan 50 bid and amlodipine 10 and hydralazine 50 tid . SBP goal < 140  off Cleviprex now   BP Stable  . 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 Multiple foci throughout progressed from previous MRI d/t microangiopathy vs amyloid.  As per husband, pt has some anomia as baseline  BP goal < 140  Hyperlipidemia  Home meds:  lipitor 10  LDL 92  Statin held in setting of acute ICH  Consider continuation of statin at discharge  Hyperglycemia  HgbA1c 6.1  CBGs q4h  SSI 0-15  Hypokalemia and Hypomagnesemia and Hypophosphatemia  Potassium 3.0->2.8->3.0->3.0->3.6->3.2 - supplement   Magnesium 1.6 - 2.2->1.8->2.3->2.2->2.1->2.0  Phosphorus 2.3->2.6->2.4->2.4   Other Stroke Risk Factors  Advanced age  Former Cigarette smoker  Family hx stroke (mother, father, and multiple other family members)  Other Active Problems  Hepatitis C  Hypothyroid on synthroid. TSH WNL - resume synthroid. Free T4 normal  Urinary retention, foley placed  Diarrhea with TF - on imodium PRN  Hospital day # 16  This patient is critically ill and at significant risk of neurological worsening, death and care requires constant monitoring of vital signs, hemodynamics,respiratory and cardiac monitoring, extensive review of multiple databases, frequent neurological assessment, discussion with family, other specialists and medical decision making of high complexity. I spent 45 minutes of neurocritical care time  in the care of  this patient. I had long discussion with husband and son at bedside, reviewed images, updated pt current condition, treatment plan and potential prognosis,  and answered all the questions.  They expressed understanding and appreciation.   Rosalin Hawking, MD PhD Stroke Neurology 03/01/2020 12:11 PM   To contact Stroke Continuity provider, please refer to [email or web address] After hours, contact General Neurology

## 2020-03-01 NOTE — Progress Notes (Signed)
[place] for argatroban Indication: HIT  Allergies  Allergen Reactions  . Erythromycin Itching and Other (See Comments)    Severe stomach pains, diarrhea  . Heparin     Likely HIT    Patient Measurements: Height: 5\' 2"  (157.5 cm) Weight: 72.9 kg (160 lb 11.5 oz) IBW/kg (Calculated) : 50.1  Vital Signs: Temp: 97.3 F (36.3 C) (04/21 1600) Temp Source: Axillary (04/21 1600) BP: 141/75 (04/21 1900) Pulse Rate: 82 (04/21 1900)  Labs: Recent Labs    02/28/20 0416 02/28/20 1748 02/29/20 0506 02/29/20 0926 02/29/20 2356 03/01/20 0456 03/01/20 0458 03/01/20 1830  HGB 9.8*  --  8.6*  --   --   --  8.7*  --   HCT 30.5*  --  27.5*  --   --   --  27.8*  --   PLT 100*  --  139*  --   --   --  95*  --   APTT 56*   < > 63*   < > 54* 57*  --  53*  CREATININE 0.81  --  0.83  --   --  0.88  --   --    < > = values in this interval not displayed.    Estimated Creatinine Clearance: 53.2 mL/min (by C-G formula based on SCr of 0.88 mg/dL).  Assessment: 74 year old initially admitted 4/4 with acute IVH and thrombocythemia with PLTC of 990k. Patient was ultimately diagnosed with polythemia, now s/p leukophoresis, therapeutic phlebotomy, and initiation of hydroxyurea. On 4/13 - found to have B/L PE - decision was risk of AC was too great and IVC placed. CT scan 4/15 showed extensive thrombus within confluence of sinuses and sent to IR with mechanical thrombectomy x5.  Of note, plt on 4/4 990>>4/13 160>>4/15 26 and now 23 4/16. DIC panel sent with DDimer > 20, fibrinogen 203. Heparin antibody OD 2.55 with 4T score ~6-8 (high likelihood). Patient received 3 doses of heparin during pheresis and no other heparin documented.   Discussed with Dr Shelba Flake (oncology) to initiate argatroban to treat HIT associated clots (since risk of further clotting may be worse than risk of re-bleed), though we will have to be careful and target the low end of therapeutic  range due to recent IVH. Normally, once therapeutic we would do daily aPTT - in this case, may need to stick with standard q12h aPTT with therapeutic levels to ensure we do not inadvertently overshoot and get higher than desired levels  aPTT is 53 on argatroban 0.28 mcg/kg/min is therapeutic.Marland Kitchen Per RN no noted bleeding or issues with IV infusion.    Goal of Therapy:  aPTT 50 - 60 seconds Monitor platelets by anticoagulation protocol: Yes   Plan: Continue argatroban 0.28 mcg/kg/min Check aPTT next at 0500  Monitor aPTT (at 0500 and 1700), CBC and S/S of bleeding daily   Alanda Slim, PharmD, [facility] Clinical Pharmacist Please see AMION for all Pharmacists' Contact Phone Numbers 03/01/2020, 7:16 PM

## 2020-03-01 NOTE — Progress Notes (Signed)
Inpatient Rehabilitation Admissions Coordinator  I met patient at bedside for rehab assessment. No family present. I will follow her progress to assist with planning dispo as appropriate.  Danne Baxter, RN, MSN Rehab Admissions Coordinator [phone] 03/01/2020 3:11 PM

## 2020-03-01 NOTE — Progress Notes (Signed)
SLP Cancellation Note  Patient Details Name: [name] MRN: [id number] DOB: 1946/09/16   Cancelled treatment:       Reason Eval/Treat Not Completed: Fatigue/lethargy limiting ability to participate. RN says that pt's level of alertness remains grossly unchanged since previous date. She recommends holding SLP today. Will f/u as able.   Osie Bond., M.A. [place] [facility] Pager [phone] Office [phone]  03/01/2020, 8:57 AM

## 2020-03-01 NOTE — Progress Notes (Addendum)
[place] for argatroban Indication: HIT  Allergies  Allergen Reactions  . Erythromycin Itching and Other (See Comments)    Severe stomach pains, diarrhea  . Heparin     Likely HIT    Patient Measurements: Height: 5\' 2"  (157.5 cm) Weight: 72.9 kg (160 lb 11.5 oz) IBW/kg (Calculated) : 50.1  Vital Signs: Temp: 97.9 F (36.6 C) (04/21 0400) Temp Source: Axillary (04/21 0400) BP: 128/67 (04/21 0500) Pulse Rate: 71 (04/21 0500)  Labs: Recent Labs    02/28/20 0416 02/28/20 1748 02/29/20 0506 02/29/20 0926 02/29/20 2035 02/29/20 2356 03/01/20 0456  HGB 9.8*  --  8.6*  --   --   --   --   HCT 30.5*  --  27.5*  --   --   --   --   PLT 100*  --  139*  --   --   --   --   APTT 56*   < > 63*   < > 68* 54* 57*  CREATININE 0.81  --  0.83  --   --   --  0.88   < > = values in this interval not displayed.    Estimated Creatinine Clearance: 53.2 mL/min (by C-G formula based on SCr of 0.88 mg/dL).  Assessment: 74 year old initially admitted 4/4 with acute IVH and thrombocythemia with PLTC of 990k. Patient was ultimately diagnosed with polythemia, now s/p leukophoresis, therapeutic phlebotomy, and initiation of hydroxyurea. On 4/13 - found to have B/L PE - decision was risk of AC was too great and IVC placed. CT scan 4/15 showed extensive thrombus within confluence of sinuses and sent to IR with mechanical thrombectomy x5.  Of note, plt on 4/4 990>>4/13 160>>4/15 26 and now 23 4/16. DIC panel sent with DDimer > 20, fibrinogen 203. Heparin antibody OD 2.55 with 4T score ~6-8 (high likelihood). Patient received 3 doses of heparin during pheresis and no other heparin documented.   Discussed with Dr Shelba Flake (oncology) to initiate argatroban to treat HIT associated clots (since risk of further clotting may be worse than risk of re-bleed), though we will have to be careful and target the low end of therapeutic range due to recent IVH. Normally, once  therapeutic we would do daily aPTT - in this case, may need to stick with standard q12h aPTT with therapeutic levels to ensure we do not inadvertently overshoot and get higher than desired levels  aPTT this morning was 57 on argatroban 0.28 mcg/kg/min is therapeutic. Hgb 8.7. Plt 95 - decrease from yesterday. Per RN no noted bleeding or issues with IV infusion. Of note, MRI brain yesterday found numerous chronic microhemorrhages.   Goal of Therapy:  aPTT 50 - 60 seconds Monitor platelets by anticoagulation protocol: Yes   Plan: Continue argatroban 0.28 mcg/kg/min Check aPTT net at 1700 today  Monitor aPTT (at 0500 and 1700), CBC and S/S of bleeding daily   Cristela Felt, PharmD PGY1 Pharmacy Resident [employer]: [phone]  03/01/2020, 5:52 AM

## 2020-03-02 ENCOUNTER — Inpatient Hospital Stay (HOSPITAL_COMMUNITY): Payer: Medicare HMO

## 2020-03-02 DIAGNOSIS — I4892 Unspecified atrial flutter: Secondary | ICD-10-CM | POA: Diagnosis not present

## 2020-03-02 DIAGNOSIS — E854 Organ-limited amyloidosis: Secondary | ICD-10-CM | POA: Diagnosis not present

## 2020-03-02 DIAGNOSIS — I10 Essential (primary) hypertension: Secondary | ICD-10-CM | POA: Diagnosis not present

## 2020-03-02 DIAGNOSIS — D696 Thrombocytopenia, unspecified: Secondary | ICD-10-CM

## 2020-03-02 LAB — APTT
aPTT: 57 seconds — ABNORMAL HIGH (ref 24–36)
aPTT: 54 seconds — ABNORMAL HIGH (ref 24–36)

## 2020-03-02 LAB — BASIC METABOLIC PANEL
Anion gap: 7 (ref 5–15)
BUN: 38 mg/dL — ABNORMAL HIGH (ref 8–23)
CO2: 20 mmol/L — ABNORMAL LOW (ref 22–32)
Calcium: 9.8 mg/dL (ref 8.9–10.3)
Chloride: 125 mmol/L — ABNORMAL HIGH (ref 98–111)
Creatinine, Ser: 0.89 mg/dL (ref 0.44–1.00)
GFR calc Af Amer: >60 mL/min (ref >60)
GFR calc non Af Amer: >60 mL/min (ref >60)
Glucose, Bld: 229 mg/dL — ABNORMAL HIGH (ref 70–99)
Potassium: 4.2 mmol/L (ref 3.5–5.1)
Sodium: 152 mmol/L — ABNORMAL HIGH (ref 135–145)

## 2020-03-02 LAB — CBC
HCT: 28.3 % — ABNORMAL LOW (ref 36.0–46.0)
Hemoglobin: 8.8 g/dL — ABNORMAL LOW (ref 12.0–15.0)
MCH: 31.1 pg (ref 26.0–34.0)
MCHC: 31.1 g/dL (ref 30.0–36.0)
MCV: 100 fL (ref 80.0–100.0)
Platelets: 45 10*3/uL — ABNORMAL LOW (ref 150–400)
RBC: 2.83 MIL/uL — ABNORMAL LOW (ref 3.87–5.11)
RDW: 19.3 % — ABNORMAL HIGH (ref 11.5–15.5)
WBC: 12.7 10*3/uL — ABNORMAL HIGH (ref 4.0–10.5)
nRBC: 0 % (ref 0.0–0.2)

## 2020-03-02 LAB — GLUCOSE, CAPILLARY
Glucose-Capillary: 209 mg/dL — ABNORMAL HIGH (ref 70–99)
Glucose-Capillary: 168 mg/dL — ABNORMAL HIGH (ref 70–99)
Glucose-Capillary: 191 mg/dL — ABNORMAL HIGH (ref 70–99)
Glucose-Capillary: 203 mg/dL — ABNORMAL HIGH (ref 70–99)
Glucose-Capillary: 204 mg/dL — ABNORMAL HIGH (ref 70–99)
Glucose-Capillary: 238 mg/dL — ABNORMAL HIGH (ref 70–99)

## 2020-03-02 LAB — PLATELET BY CITRATE

## 2020-03-02 LAB — PLATELET COUNT: Platelets: 36 10*3/uL — ABNORMAL LOW (ref 150–400)

## 2020-03-02 LAB — SODIUM
Sodium: 152 mmol/L — ABNORMAL HIGH (ref 135–145)
Sodium: 152 mmol/L — ABNORMAL HIGH (ref 135–145)
Sodium: 152 mmol/L — ABNORMAL HIGH (ref 135–145)

## 2020-03-02 LAB — LACTATE DEHYDROGENASE: LDH: 427 U/L — ABNORMAL HIGH (ref 98–192)

## 2020-03-02 NOTE — Progress Notes (Signed)
[place] for argatroban Indication: HIT  Allergies  Allergen Reactions  . Erythromycin Itching and Other (See Comments)    Severe stomach pains, diarrhea  . Heparin     Likely HIT    Patient Measurements: Height: 5\' 2"  (157.5 cm) Weight: 72.2 kg (159 lb 2.8 oz) IBW/kg (Calculated) : 50.1  Vital Signs: Temp: 97.8 F (36.6 C) (04/22 0400) Temp Source: Axillary (04/22 0400) BP: 135/100 (04/22 0430) Pulse Rate: 73 (04/22 0430)  Labs: Recent Labs    02/29/20 0506 02/29/20 0926 03/01/20 0456 03/01/20 0458 03/01/20 1830 03/02/20 0429  HGB 8.6*  --   --  8.7*  --   --   HCT 27.5*  --   --  27.8*  --   --   PLT 139*  --   --  95*  --   --   APTT 63*   < > 57*  --  53* 57*  CREATININE 0.83  --  0.88  --   --  0.89   < > = values in this interval not displayed.    Estimated Creatinine Clearance: 52.3 mL/min (by C-G formula based on SCr of 0.89 mg/dL).  Assessment: 74 year old initially admitted 4/4 with acute IVH and thrombocythemia with PLTC of 990k. Patient was ultimately diagnosed with polythemia, now s/p leukophoresis, therapeutic phlebotomy, and initiation of hydroxyurea. On 4/13 - found to have B/L PE - decision was risk of AC was too great and IVC placed. CT scan 4/15 showed extensive thrombus within confluence of sinuses and sent to IR with mechanical thrombectomy x5.  Of note, plt on 4/4 990>>4/13 160>>4/15 26 and now 23 4/16. DIC panel sent with DDimer > 20, fibrinogen 203. Heparin antibody OD 2.55 with 4T score ~6-8 (high likelihood). Patient received 3 doses of heparin during pheresis and no other heparin documented.   Discussed with Dr Shelba Flake (oncology) to initiate argatroban to treat HIT associated clots (since risk of further clotting may be worse than risk of re-bleed), though we will have to be careful and target the low end of therapeutic range due to recent IVH. Normally, once therapeutic we would do daily aPTT - in  this case, may need to stick with standard q12h aPTT with therapeutic levels to ensure we do not inadvertently overshoot and get higher than desired levels  aPTT is 57 on argatroban 0.28 mcg/kg/min is therapeutic. Per RN no noted bleeding or issues with IV infusion. Of note, SRA resulted negative yesterday, paged Dr. Jana Hakim to discuss.   Goal of Therapy:  aPTT 50 - 60 seconds Monitor platelets by anticoagulation protocol: Yes   Plan: Continue argatroban 0.28 mcg/kg/min Check aPTT next at 1700 Monitor aPTT (at 0500 and 1700), CBC and S/S of bleeding daily  Follow up with Dr. Jana Hakim about negative SRA  Cristela Felt, PharmD PGY1 Pharmacy Resident [employer]: [phone]  03/02/2020, 5:38 AM

## 2020-03-02 NOTE — Progress Notes (Signed)
SLP Cancellation Note  Patient Details Name: [name] MRN: [id number] DOB: 1946/06/29   Cancelled treatment:       Reason Eval/Treat Not Completed: Medical issues which prohibited therapy;Fatigue/lethargy limiting ability to participate. RN suggests holding SLP services today given mentation. Will f/u as able.    Osie Bond., M.A. [place] [facility] Pager [phone] Office [phone]  03/02/2020, 8:27 AM

## 2020-03-02 NOTE — Progress Notes (Signed)
[name]   DOB:1946/01/03   UX#:[id number]   OZD#:[id number]  Subjective:  [name] moves slightly and moans to voice and touch. Husband and son in room.   Objective: white woman examined in bed, restraints in place Vitals:   03/02/20 0700 03/02/20 0800  BP: (!) 146/87 (!) 175/93  Pulse: 77 81  Resp: 17 20  Temp:    SpO2: 100% 100%    Body mass index is 29.11 kg/m.  Intake/Output Summary (Last 24 hours) at 03/02/2020 0829 Last data filed at 03/02/2020 0700 Gross per 24 hour  Intake 2259.37 ml  Output 875 ml  Net 1384.37 ml     CBG (last 3)  Recent Labs    03/01/20 2332 03/02/20 0328 03/02/20 0802  GLUCAP 211* 209* 168*     Labs:  Lab Results  Component Value Date   WBC 12.7 (H) 03/02/2020   HGB 8.8 (L) 03/02/2020   HCT 28.3 (L) 03/02/2020   MCV 100.0 03/02/2020   PLT 45 (L) 03/02/2020   NEUTROABS 9.0 (H) 02/26/2020    _0 @  Urine Studies No results for input(s): UHGB, CRYS in the last 72 hours.  Invalid input(s): UACOL, UAPR, USPG, UPH, UTP, UGL, UKET, UBIL, UNIT, UROB, ULEU, UEPI, UWBC, URBC, UBAC, CAST, [employer], [place]  Basic Metabolic Panel: Recent Labs  Lab 02/25/20 0218 02/26/20 0259 02/27/20 0221 02/27/20 2595 02/28/20 0416 02/28/20 0920 02/28/20 1233 02/28/20 1550 02/28/20 2127 02/29/20 0506 02/29/20 0926 02/29/20 1607 02/29/20 2035 03/01/20 0456 03/01/20 1239 03/01/20 1830 03/01/20 2354 03/02/20 0429  NA 139   < > 137   < > 147*   < >  --  149*   < > 156*   < > 156*   < > 154* 157* 154* 152* 152*  K 3.0*   < > 3.0*  --  3.0*  --   --   --   --  3.6  --   --   --  3.2*  --   --   --  4.2  CL 110   < > 110  --  116*  --   --   --   --  >130*  --   --   --  127*  --   --   --  125*  CO2 18*   < > 18*  --  16*  --   --   --   --  16*  --   --   --  18*  --   --   --  20*  GLUCOSE 140*   < > 118*  --  149*  --   --   --   --  188*  --   --   --  243*  --   --   --  229*  BUN 8   < > 7*  --  12  --   --   --   --  24*  --    --   --  32*  --   --   --  38*  CREATININE 0.80   < > 0.72  --  0.81  --   --   --   --  0.83  --   --   --  0.88  --   --   --  0.89  CALCIUM 8.3*   < > 8.5*  --  9.1  --   --   --   --  9.3  --   --   --  9.7  --   --   --  9.8  MG 2.3  --   --   --   --   --  2.2 2.1  --  2.0  --  2.1  --   --   --   --   --   --   PHOS  --   --   --   --   --   --  2.6 2.4*  --  2.4*  --  2.8  --   --   --   --   --   --    < > = values in this interval not displayed.   GFR Estimated Creatinine Clearance: 52.3 mL/min (by C-G formula based on SCr of 0.89 mg/dL). Liver Function Tests: Recent Labs  Lab 02/29/20 0506  AST 26  ALT 46*  ALKPHOS 97  BILITOT 0.5  PROT 5.4*  ALBUMIN 2.9*   No results for input(s): LIPASE, AMYLASE in the last 168 hours. No results for input(s): AMMONIA in the last 168 hours. Coagulation profile Recent Labs  Lab 02/24/20 1450  INR 1.3*    CBC: Recent Labs  Lab 02/25/20 0218 02/25/20 0218 02/26/20 0259 02/26/20 0818 02/27/20 0221 02/28/20 0416 02/29/20 0506 03/01/20 0458 03/02/20 0429  WBC 10.6*   < > 11.2*   < > 13.0* 14.0* 13.6* 13.6* 12.7*  NEUTROABS 9.1*  --  9.0*  --   --   --   --   --   --   HGB 11.6*   < > 10.6*   < > 10.5* 9.8* 8.6* 8.7* 8.8*  HCT 35.0*   < > 32.5*   < > 32.0* 30.5* 27.5* 27.8* 28.3*  MCV 90.7   < > 92.9   < > 91.4 94.1 95.5 97.5 100.0  PLT 23*   < > 38*   < > 51* 100* 139* 95* 45*   < > = values in this interval not displayed.   Cardiac Enzymes: No results for input(s): CKTOTAL, CKMB, CKMBINDEX, TROPONINI in the last 168 hours. BNP: Invalid input(s): POCBNP CBG: Recent Labs  Lab 03/01/20 1526 03/01/20 1939 03/01/20 2332 03/02/20 0328 03/02/20 0802  GLUCAP 150* 175* 211* 209* 168*   D-Dimer No results for input(s): DDIMER in the last 72 hours. Hgb A1c No results for input(s): HGBA1C in the last 72 hours. Lipid Profile Recent Labs    02/29/20 0506  TRIG 96   Thyroid function studies Recent Labs     02/29/20 0506  TSH 5.311*   Anemia work up No results for input(s): VITAMINB12, FOLATE, FERRITIN, TIBC, IRON, RETICCTPCT in the last 72 hours. Microbiology No results found for this or any previous visit (from the past 240 hour(s)).    Studies:  MR BRAIN WO CONTRAST  Result Date: 02/29/2020 CLINICAL DATA:  Abnormal head CT. Suspected dural venous sinus thrombosis. EXAM: MRI HEAD WITHOUT CONTRAST MRV HEAD WITHOUT CONTRAST TECHNIQUE: Multiplanar, multiecho pulse sequences of the brain and surrounding structures were obtained without intravenous contrast. Angiographic images of the intracranial venous structures were obtained using MRV technique without intravenous contrast. COMPARISON:  None. FINDINGS: MRI BRAIN FINDINGS BRAIN: There is unchanged intraventricular hemorrhage within the atrium of the right lateral ventricle. Small amount of layering blood within both occipital horns. There are numerous punctate foci abnormal diffusion restriction within all vascular territories. The largest confluent area is in the posterior right temporal lobe. Diffuse confluent hyperintense T2-weighted signal within the periventricular, deep and juxtacortical  white matter, most commonly due to chronic ischemic microangiopathy. Normal volume of brain parenchyma and CSF spaces. Midline structures are normal. There are numerous chronic microhemorrhages in a predominantly peripheral distribution. SKULL AND UPPER CERVICAL SPINE: Normal calvarium and skull base. Visualized upper cervical spine and soft tissues are normal. SINUSES/ORBITS: No paranasal sinus fluid levels or advanced mucosal thickening. No mastoid or middle ear effusion. Normal orbits. MRV BRAIN FINDINGS Superior sagittal sinus: Normal. Straight sinus: Loss of normal flow related enhancement Inferior sagittal sinus, vein of Galen and internal cerebral veins: Loss of normal flow related enhancement Transverse sinuses: Normal on the right. There is loss of flow  related enhancement on the left. Sigmoid sinuses: Normal on the right. Loss of flow related enhancement on the left. Visualized jugular veins: Normal on the right. Diminutive on the left. IMPRESSION: 1. Redemonstration of dural venous sinus thrombosis involving the straight sinus, vein of Galen, internal cerebral veins, left transverse sinus and left sigmoid sinus. 2. Numerous punctate foci of acute ischemia within all vascular territories in an embolic pattern. 3. Numerous chronic microhemorrhages in a predominantly peripheral distribution, consistent with cerebral amyloid angiopathy. Electronically Signed   By: Ulyses Jarred M.D.   On: 02/29/2020 20:37   MR MRV HEAD WO CM  Result Date: 02/29/2020 CLINICAL DATA:  Abnormal head CT. Suspected dural venous sinus thrombosis. EXAM: MRI HEAD WITHOUT CONTRAST MRV HEAD WITHOUT CONTRAST TECHNIQUE: Multiplanar, multiecho pulse sequences of the brain and surrounding structures were obtained without intravenous contrast. Angiographic images of the intracranial venous structures were obtained using MRV technique without intravenous contrast. COMPARISON:  None. FINDINGS: MRI BRAIN FINDINGS BRAIN: There is unchanged intraventricular hemorrhage within the atrium of the right lateral ventricle. Small amount of layering blood within both occipital horns. There are numerous punctate foci abnormal diffusion restriction within all vascular territories. The largest confluent area is in the posterior right temporal lobe. Diffuse confluent hyperintense T2-weighted signal within the periventricular, deep and juxtacortical white matter, most commonly due to chronic ischemic microangiopathy. Normal volume of brain parenchyma and CSF spaces. Midline structures are normal. There are numerous chronic microhemorrhages in a predominantly peripheral distribution. SKULL AND UPPER CERVICAL SPINE: Normal calvarium and skull base. Visualized upper cervical spine and soft tissues are normal.  SINUSES/ORBITS: No paranasal sinus fluid levels or advanced mucosal thickening. No mastoid or middle ear effusion. Normal orbits. MRV BRAIN FINDINGS Superior sagittal sinus: Normal. Straight sinus: Loss of normal flow related enhancement Inferior sagittal sinus, vein of Galen and internal cerebral veins: Loss of normal flow related enhancement Transverse sinuses: Normal on the right. There is loss of flow related enhancement on the left. Sigmoid sinuses: Normal on the right. Loss of flow related enhancement on the left. Visualized jugular veins: Normal on the right. Diminutive on the left. IMPRESSION: 1. Redemonstration of dural venous sinus thrombosis involving the straight sinus, vein of Galen, internal cerebral veins, left transverse sinus and left sigmoid sinus. 2. Numerous punctate foci of acute ischemia within all vascular territories in an embolic pattern. 3. Numerous chronic microhemorrhages in a predominantly peripheral distribution, consistent with cerebral amyloid angiopathy. Electronically Signed   By: Ulyses Jarred M.D.   On: 02/29/2020 20:37    Assessment: 74 y.o. [place] woman presenting 02/13/2020 with headache, found to have biventricular bleeds in the setting of panmyelosis, subsequently with pulmonary emboli and sinus venous thromboses in the setting of heparin induced thrombocytopenia  (1) polycythemia vera  (a) JAK2 mutation positive confirming diagnosis  (b) bone marrow biopsy 02/13/2020 shows no  evidence of leukemia, c/w P Vera  (c)  leukapheresis x3 . Last leukapheresis Friday 02/18/2020   (i) HD catheter removed after pheresis on  02/18/2020  (d) hydrea as needed to keep HCT <45 and platelets in normal range   (2) heparin induced thrombocytopenia: positive HIT screen  (a) severe thrombocythemia noted 02/24/2020  (b) bilateral pulmonary emboli 02/22/2020   (i) s/p IVC filter placement 02/22/2020   (ii) atrial fibrillation with RVR  (c) venous sinus thromboses  02/24/2020   (i) s/p mechanical thrombectomy  (d) argatroban started 02/25/2020  (e) platelet count responded to heparin withdrawal  (f) confirmatory serotonin release assay negative (see discussion below)   (i) continue argatroban for anticoagulation, no heparin   (ii) assay being repeated in a different lab  (3) drug-induced thrombocytopenia (see discussion below)  (a) discontinue amiodarone (?have queried cardiology)  Plan:  Met with husband and son and discussed situation from a heme point of view.   REVIEW OF BLOOD FILM: today's blood film shows no schistocytes, no red cell abnormalities; leukocytosis with a minimal left shift; no platelet clumps  Despite the negative serotonin release assay, we should continue with heparin induced thrombocytopenia as a working diagnosis. There was a high pretest probability, the screen was strongly positive, and the platelet count responded to heparin withdrawal. The serotonin release assay has a very low false negative rate when properly performed; however it is a finicky assay, not frequently performed, and the [employer] test does not completely exclude the diagnosis of HIT. I am requesting a repeat in a different lab. In the meantime we should continue to avoid heparin (discussed with nursing) and use argatroban for anticoagulation.  We now have a platelet decline again. Assuming the patient did not receive heparin for flushes etc, we have to consider multiple possibilities including DIC, TTP, and ITP. However the patient is on high-dose dexamethasone, which argues against ITP. I have reviewed the blood film today and there are absolutely no schistocytes, which makes TTP and DIC unlikely.  Accordingly I suspect we are dealing with drug-induced thrombocytopenia. Dozens of drugs have been implicated in this hemorrhagic diathesis, but amiodarone is the only current drug she is receiving that is on a level 1 list. I have sent a note to Dr Gwenlyn Found in cardiology  to see if we can discontinue amiodarone and substitute a different drug for her A fib (which is now well controlled). Would also discontinue any other drugs not absolutely necessary for her immediate treatment.  Will follow with you    Chauncey Cruel, MD 03/02/2020  8:29 AM Medical Oncology and Hematology [facility] [address] [place], [place] [address] Tel. [phone]    Fax. [phone]

## 2020-03-02 NOTE — Progress Notes (Deleted)
Physical Therapy Treatment Patient Details Name: [name] MRN: [id number] DOB: 06/02/1946 Today's Date: 03/02/2020    History of Present Illness 74 yo female presented with persistent HA, nausea and vomiting pt with stable R lateral ventricle IVH. Pt with acute bil PE 4/13, IVC filter placed 4/14. 4/15 head CT demonstrated deep cerebral thrombosis s/p IR for thrombectomy with expressive aphasia after. 4/16 MRI showed multiple cerebral and cerebellar infarcts, Rt temporal infarct with cytotoxic edema and hypertonic saline started. PMHx: HTN, HLD, hep C, hypothyroidism. MRI from 4/20 showing left temporal and parietal significant petechial hemorrhage, likely due to significant CVST.    PT Comments    Pt initially lethargic but becoming more alert with sitting edge of bed and with oral care. Pt becomes able to follow motor commands for RUE and RLE during session, but is unable with actively mobilize L side at this time. Pt requires totalA for all functional mobility. Pt demonstrates the ability to track to midline if PT assists with opening of eye, can not track left of midline at this time. Pt will continue to benefit from PT POC to improve activity tolerance, functional mobility, and balance. Pt with very supportive daughter present and providing encouragement during session.  Follow Up Recommendations  CIR;Supervision/Assistance - 24 hour(needs continued improvement in activity tolerance)     Equipment Recommendations  Hospital bed;Wheelchair (measurements PT);Wheelchair cushion (measurements PT);Other (comment)(mechanical lift, if D/C home today)    Recommendations for Other Services       Precautions / Restrictions Precautions Precautions: Fall Precaution Comments: SBP <140, expressive/receptive difficulties  Restrictions Weight Bearing Restrictions: No    Mobility  Bed Mobility Overal bed mobility: Needs Assistance Bed Mobility: Supine to Sit;Sit to Supine     Supine to  sit: Total assist;+2 for physical assistance Sit to supine: Total assist;+2 for physical assistance      Transfers Overall transfer level: Needs assistance Equipment used: 2 person hand held assist Transfers: Sit to/from Stand Sit to Stand: Total assist;+2 physical assistance            Ambulation/Gait                 Stairs             Wheelchair Mobility    Modified Rankin (Stroke Patients Only)       Balance Overall balance assessment: Needs assistance Sitting-balance support: Feet supported;Single extremity supported Sitting balance-Leahy Scale: Zero Sitting balance - Comments: maxA-total A to maintain sitting balance Postural control: Posterior lean;Right lateral lean Standing balance support: Bilateral upper extremity supported Standing balance-Leahy Scale: Zero Standing balance comment: totalA x2 to maintain standing                            Cognition Arousal/Alertness: Lethargic(becomes more alert with stimulation) Behavior During Therapy: Flat affect Overall Cognitive Status: Difficult to assess                         Following Commands: Follows one step commands consistently;Follows one step commands with increased time(once becoming more alert)              Exercises General Exercises - Upper Extremity Shoulder Flexion: AAROM;Right;5 reps Elbow Flexion: AROM;Right;5 reps Digit Composite Flexion: AROM;Right;5 reps General Exercises - Lower Extremity Long Arc Quad: AROM;Right;5 reps;PROM;Left    General Comments General comments (skin integrity, edema, etc.): pt on vent, 40% FiO2 PEEP of 5. Pt  tachy up to 151 during activity, HR in 110-120s for most of session      Pertinent Vitals/Pain Pain Assessment: Faces Faces Pain Scale: No hurt    Home Living                      Prior Function            PT Goals (current goals can now be found in the care plan section) Acute Rehab PT  Goals Patient Stated Goal: none stated today, previous to go home with family Progress towards PT goals: Progressing toward goals(slowly)    Frequency    Min 3X/week      PT Plan Current plan remains appropriate    Co-evaluation PT/OT/SLP Co-Evaluation/Treatment: Yes Reason for Co-Treatment: Complexity of the patient's impairments (multi-system involvement);Necessary to address cognition/behavior during functional activity;For patient/therapist safety;To address functional/ADL transfers PT goals addressed during session: Mobility/safety with mobility;Balance;Proper use of DME;Strengthening/ROM        AM-PAC PT "6 Clicks" Mobility   Outcome Measure  Help needed turning from your back to your side while in a flat bed without using bedrails?: Total Help needed moving from lying on your back to sitting on the side of a flat bed without using bedrails?: Total Help needed moving to and from a bed to a chair (including a wheelchair)?: Total Help needed standing up from a chair using your arms (e.g., wheelchair or bedside chair)?: Total Help needed to walk in hospital room?: Total Help needed climbing 3-5 steps with a railing? : Total 6 Click Score: 6    End of Session Equipment Utilized During Treatment: Oxygen Activity Tolerance: Patient tolerated treatment well Patient left: in bed;with call bell/phone within reach;with bed alarm set;with family/visitor present;with restraints reapplied Nurse Communication: Mobility status;Precautions PT Visit Diagnosis: Other abnormalities of gait and mobility (R26.89);Pain;Other symptoms and signs involving the nervous system (R29.898);Difficulty in walking, not elsewhere classified (R26.2)     Time: [id number] PT Time Calculation (min) (ACUTE ONLY): 29 min  Charges:  $Therapeutic Activity: 8-22 mins                     [name], PT, DPT Acute Rehabilitation Pager: [phone]    [name] 03/02/2020, 11:25 AM

## 2020-03-02 NOTE — Progress Notes (Signed)
[place] for argatroban Indication: HIT  Allergies  Allergen Reactions  . Erythromycin Itching and Other (See Comments)    Severe stomach pains, diarrhea  . Heparin     Likely HIT    Patient Measurements: Height: 5\' 2"  (157.5 cm) Weight: 72.2 kg (159 lb 2.8 oz) IBW/kg (Calculated) : 50.1  Vital Signs: Temp: 97.6 F (36.4 C) (04/22 1600) Temp Source: Axillary (04/22 1600) BP: 155/47 (04/22 1800) Pulse Rate: 105 (04/22 1800)  Labs: Recent Labs    02/29/20 0506 02/29/20 0506 02/29/20 0926 03/01/20 0456 03/01/20 0456 03/01/20 0458 03/01/20 1830 03/02/20 0429 03/02/20 0844 03/02/20 1549  HGB 8.6*   < >  --   --   --  8.7*  --  8.8*  --   --   HCT 27.5*  --   --   --   --  27.8*  --  28.3*  --   --   PLT 139*   < >  --   --   --  95*  --  45* 36*  --   APTT 63*  --    < > 57*   < >  --  53* 57*  --  54*  CREATININE 0.83  --   --  0.88  --   --   --  0.89  --   --    < > = values in this interval not displayed.    Estimated Creatinine Clearance: 52.3 mL/min (by C-G formula based on SCr of 0.89 mg/dL).  Assessment: 74 year old initially admitted 4/4 with acute IVH and thrombocythemia with PLTC of 990k. Patient was ultimately diagnosed with polythemia, now s/p leukophoresis, therapeutic phlebotomy, and initiation of hydroxyurea. On 4/13 - found to have B/L PE - decision was risk of AC was too great and IVC placed. CT scan 4/15 showed extensive thrombus within confluence of sinuses and sent to IR with mechanical thrombectomy x5.  aPTT is 54 on argatroban 0.28 mcg/kg/min; remains therapeutic. Per RN no noted bleeding or issues with IV infusion. Of note, SRA resulted negative yesterday, Dr. Jana Hakim ordered repeat sent to different lab - still with working diagnosis of HIT d/t high pre-test probability. Now also need to look into other causes for new TCP   Goal of Therapy:  aPTT 50 - 60 seconds Monitor platelets by anticoagulation  protocol: Yes   Plan: Continue argatroban 0.28 mcg/kg/min Monitor aPTT (at 0500 and 1700), CBC and S/S of bleeding daily   Barth Kirks, PharmD, BCPS, BCCCP Clinical Pharmacist [phone]  Please check AMION for all [place] numbers  03/02/2020 6:54 PM

## 2020-03-02 NOTE — Progress Notes (Signed)
STROKE TEAM PROGRESS NOTE   INTERVAL HISTORY RN at bedside. Pt still agitated and restless this am. As per RN, she did not have good sleep either last night. However, she did respond to oxycodone and seroquel some. However, this am lab showed her platelet continues to drop. Dr. Jana Hakim on board and concerning for drug induced thrombocytopenia, will d/c recently started meds if able. Will also need to stop amiodarone if able.   Vitals:   03/02/20 0700 03/02/20 0800 03/02/20 0900 03/02/20 1000  BP: (!) 146/87 (!) 175/93 (!) 152/64   Pulse: 77 81 75 79  Resp: 17 20 13 16   Temp:  98.4 F (36.9 C)    TempSrc:  Axillary    SpO2: 100% 100% 100% 100%  Weight:      Height:       CBC:  Recent Labs  Lab 02/25/20 0218 02/25/20 0218 02/26/20 0259 02/26/20 0818 03/01/20 0458 03/02/20 0429  WBC 10.6*   < > 11.2*   < > 13.6* 12.7*  NEUTROABS 9.1*  --  9.0*  --   --   --   HGB 11.6*   < > 10.6*   < > 8.7* 8.8*  HCT 35.0*   < > 32.5*   < > 27.8* 28.3*  MCV 90.7   < > 92.9   < > 97.5 100.0  PLT 23*   < > 38*   < > 95* 45*   < > = values in this interval not displayed.   Basic Metabolic Panel:  Recent Labs  Lab 02/29/20 0506 02/29/20 0926 02/29/20 1607 02/29/20 2035 03/01/20 0456 03/01/20 1239 03/02/20 0429 03/02/20 0844  NA 156*   < > 156*   < > 154*   < > 152* 152*  K 3.6  --   --   --  3.2*  --  4.2  --   CL >130*  --   --   --  127*  --  125*  --   CO2 16*  --   --   --  18*  --  20*  --   GLUCOSE 188*  --   --   --  243*  --  229*  --   BUN 24*  --   --   --  32*  --  38*  --   CREATININE 0.83  --   --   --  0.88  --  0.89  --   CALCIUM 9.3  --   --   --  9.7  --  9.8  --   MG 2.0  --  2.1  --   --   --   --   --   PHOS 2.4*  --  2.8  --   --   --   --   --    < > = values in this interval not displayed.   Lipid Panel:     Component Value Date/Time   CHOL 172 02/14/2020 0959   TRIG 96 02/29/2020 0506   HDL 71 02/14/2020 0959   CHOLHDL 2.4 02/14/2020 0959   VLDL 9  02/14/2020 0959   LDLCALC 92 02/14/2020 0959   HgbA1c:  Lab Results  Component Value Date   HGBA1C 6.1 (H) 02/14/2020    IMAGING past 24 hours CT HEAD WO CONTRAST  Result Date: 03/02/2020 CLINICAL DATA:  74 year old female with complicated medical history of intraventricular hemorrhage, dural venous sinus thrombosis (including the vein of Galen), PE, and  subsequently heparin induced thrombocytopenia. Intracranial hemorrhage. EXAM: CT HEAD WITHOUT CONTRAST TECHNIQUE: Contiguous axial images were obtained from the base of the skull through the vertex without intravenous contrast. COMPARISON:  Brain MRI and head MRV 02/29/2020 and earlier. FINDINGS: Brain: The recent confluent gyriform hemorrhage in the lateral left temporal and occipital lobes demonstrated by MRI has a mixed hyperdense and intermediate density petechial type appearance on series 3, image 13. Associated confluent white matter hypodensity compatible with associated cerebral edema. And there is on going superimposed bilateral white matter and deep gray matter edema suspected in the setting dural venous sinus and deep venous thrombosis. No superimposed new cortically based infarct identified. Stable relatively small volume intraventricular hemorrhage most pronounced in the atrium of the right lateral ventricle. No definite extra-axial hemorrhage otherwise. No associated midline shift. Mild mass effect on the left lateral ventricle but no ventriculomegaly. Basilar cisterns remain patent. Vascular: Calcified atherosclerosis at the skull base. Continued conspicuous hyperdensity of the straight sinus and vein of Galen compatible with deep venous thrombosis (sagittal images 25 and 26). Skull: No acute osseous abnormality identified. Sinuses/Orbits: Visualized paranasal sinuses and mastoids are stable and generally well pneumatized. Other: Left nasoenteric tube in place. Negative orbit and scalp soft tissues. IMPRESSION: 1. Recent confluent but  petechial type hemorrhage in the lateral left temporal and occipital lobes, stable from the MRI 02/29/2020 with associated increased left hemisphere edema. 2. Stable IVH.  No new intracranial hemorrhage. 3. Only mild intracranial mass effect at this time without midline shift or loss of basilar cisterns. 4. Continued deep venous thrombosis with ongoing hyperdensity of the straight sinus and vein of Galen. 5. Suspect associated bilateral white matter and deep gray matter nuclei edema secondary to #4. Electronically Signed   By: Genevie Ann M.D.   On: 03/02/2020 10:32     PHYSICAL EXAM  General - Well nourished, well developed, restless and agitated with exam  Ophthalmologic - fundi not visualized due to noncooperation.  Cardiovascular - Regular rhythm and rate, not in afib.  Neuro - restless, agitated with exam, but drowsy without stimulation, not open eyes on voice or pain, moaning with stimulation, not following commands. She resisted eye opening and difficult to exam pupils, facial symmetrical. BUE against gravity, symmetrical. BLE 2+/5 withdraw to pain, symmetrical. DTR 1+, no babinski. Sensation, coordination not cooperative and gait not tested.   ASSESSMENT/PLAN [name] is a 74 y.o. female with history of HTN presenting with severe persistent HA accompanied by nausea and vomiting. This has now improved.   IVH - right lateral ventricle IVH secondary to ? CAA vs. HTN Stroke: multiple punctate B anterior and posterior circulation embolic infarcts in setting of PAF  CT head 4/5 0032 R lateral IVH. Severe chronic ischemic microangiopathy.  MRI 4/5 Stable IVH, severe leukoaraiosis. Multiple foci throughout progressed from previous MRI d/t microangiopathy vs amyloid.  MRA unremarkable   CT head repeat 4/5-4/10 - unchanged size and configuration of right IVH  CT Head 02/26/20 - Increased edema in the left greater than right temporal and occipital lobes which may reflect worsening  edema from venous hypertension and venous infarcts.   CT head 02/28/20 decreased L>R edema. IVH same. Known SVT.    MRI 4/20 - left temporal and parietal significant petechial hemorrhage, likely due to significant CVST. Numerous punctate infarcts throughout brain. Numerous chronic microhemorrhages c/w CAA.  CT head 4/22 stable L temporal and occipital petechial hemorrhage. Stable IVH. Mild intracranial mass effect no midline shift. CSVT stable  w/ edema.  EEG - moderate diffuse encephalopathy  2D Echo EF 65-70%. No source of embolus   LDL 92   HgbA1c 6.1   DVT prophylaxis - argatroban IV   aspirin 81 mg daily prior to admission, now on argatroban due to HIT -:Plan AC for at least 6 mos. Switch to DOAC at d/c  Therapy recommendations:  CIR  Disposition:  pending  Pancytosis due to polycythemia vera   WBC 12->24.4->17.9->11.2->13.0->14.0->13.6->13.6->12.7 (afebrile)  Hgb 17.1->18.6->16.2->13.4->10.6->10.5->9.8->8.6->8.7->8.8   PLT 990->738->548->324->160->23->38->41->51->100->139->95->45->39   Bone marrow bx done, results no leukemia, consistent with polycythemia vera   Treated with hydrea and allopurinol, now off  S/p Phlebotomy and pheresis x 3  Erythropoietin level 1.3 (L)  JAK2 positive for V617F mutation  Re-eval 4/22 w/ PLT drop - felt to be medication related - seroquel and fiorecet stopped. Dr. Ron Agee reaching out to Dr. Gwenlyn Found to discuss stopping amiodarone   LDH 427 PLT ct c/w citrate adamts 13 activity pending HIT ab pending reticulocytes pending   Dr. Ron Agee on board   HIT syndrome  B/l PE cerebral venous sinus thrombosis s/p IR ? Drug induced thrombocytopenia  4/13 New SOB and tachycardia  CTA chest - BLE PE.   Filter placement on 4/13 as unable to get [facility] d/t IVH  4/15 CT head - straight sinus and vein of Galan CSVT  CTV Confirmed extensive thrombus within the confluence of sinuses and straight sinus, also extending into the vein of Galen.  There is also fairly extensive multifocal thrombus within the right greater than left transverse and sigmoid dural venous sinuses.  S/p mechanical thrombectomy 02/24/20 of occluded deep cerebral veins  MRI 02/25/2020 shows multiple by cerebral and cerebellar infarcts as well as right temporal cortical venous infarct and cytotoxic edema  MRV persistent occlusion of 3 sinus, vein of Galan, right IJ, right basal vein of Rosenthal  CT head 4/17 increase edema in the left greater than right temporal and occipital lobes  CT head 4/19 decreased L>R edema. IVH same. Known SVT.  MRV 4/20 - dural venous sinus unchanged.   MRI 4/20 - left temporal and parietal significant petechial hemorrhage, likely due to significant CVST.   CT head 4/22 stable L temporal and occipital petechial hemorrhage. Stable IVH. Mild intracranial mass effect no midline shift. DVT stable w/ edema.  PLT 990->738->548->324->160->23->38->41->51->100->139->95->45->36   HIT antibody positive  On argatroban IV -> continue, may switch to DOAC later. AC at least 6 months  Decadron 10mg  x 1 -> 4mg  q6  Off 3% saline   Na Q 6 hrs  Na goal 150 - 155  Na 142->155->147->147->149->153->156->159->154->157->152->152->152  Repeat HIT ab pending   D/c potential offending meds - amiodraone, oxycodone, fioricet, imodium, seroquel  Headache, due to IVH and CSVT  Depakote 500 mg nightly discontinued  Pt now has Fioricet one Q8 hrs prn as well as tylenol prn  Solumedrol 500 mg -> prednisone -> off  NS at 100 cc's / hr started 4/10 -> 3% saline  Add topamax 25 bid  Decadron started 10 mg IV x 1 then 4 mg IV Q 6 hrs  CT head 4/17 increase edema in the left greater than right temporal and occipital lobes  CT head 4/19 decreased L>R edema. IVH same. Known SVT.  MRI and MRV4/20 dural venous sinus unchanged. left temporal and parietal significant petechial hemorrhage, likely due to significant CVST.   CT head 4/22  stable L  temporal and occipital petechial hemorrhage. Stable IVH. Mild intracranial mass effect no midline  shift. CSVT stable w/ edema.  Afib RVR, new diagnosis  Rate in 160's -> now back to sinus  Diltizem gtt discontinued  On amiodarone  Continue amiodarone through acute illness and stop when safely on AC w/ DOAC  TSH 5.311  Free T4 normal  OK with AC for 6 mos only due to acute illness and no recurrence of afib. Switch to DOAC at d/c  Cardiology signed off 02/26/20  Dr. Ron Agee reaching out to Dr. Gwenlyn Found to discuss stopping amiodarone   Dysphagia  NPO status due to altered mental status  cortrak placed under fluoro 02/26/20 -> pt pulled off same day  cortrak replaced 4/19  On TF @ 60cc  SLP onboard  Hypertension  Home meds:  losartan 100, amlodipine 5  On losartan 50 bid and amlodipine 10 and hydralazine 100 tid . SBP goal < 140  off Cleviprex now   BP Stable  . 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 Multiple foci throughout progressed from previous MRI d/t microangiopathy vs amyloid.  As per husband, pt has some anomia as baseline  BP goal < 140  Hyperlipidemia  Home meds:  lipitor 10  LDL 92  Statin held in setting of acute ICH  Consider continuation of statin at discharge  Hyperglycemia  HgbA1c 6.1  CBGs q4h  SSI 0-15  Hypokalemia and Hypomagnesemia and Hypophosphatemia  Potassium 3.0->2.8->3.0->3.0->3.6->3.2->4.2 - supplement   Magnesium 1.6 - 2.2->1.8->2.3->2.2->2.1->2.0  Phosphorus 2.3->2.6->2.4->2.4   Other Stroke Risk Factors  Advanced age  Former Cigarette smoker  Family hx stroke (mother, father, and multiple other family members)  Other Active Problems  Hepatitis C  Hypothyroid on synthroid. TSH WNL - resume synthroid. Free T4 normal  Urinary  retention, foley placed  Diarrhea with TF - on imodium PRN - now off  Hospital day # 17  This patient is critically ill and at significant risk of neurological worsening, death and care requires constant monitoring of vital signs, hemodynamics,respiratory and cardiac monitoring, extensive review of multiple databases, frequent neurological assessment, discussion with family, other specialists and medical decision making of high complexity. I spent 45 minutes of neurocritical care time  in the care of  this patient. I had discussed with Dr. Jana Hakim.  Rosalin Hawking, MD PhD Stroke Neurology 03/02/2020 11:43 AM   To contact Stroke Continuity provider, please refer to [email or web address] After hours, contact General Neurology

## 2020-03-02 NOTE — Progress Notes (Signed)
PT Cancellation Note  Patient Details Name: [name] MRN: [id number] DOB: [birth year]   Cancelled Treatment:    Reason Eval/Treat Not Completed: Medical issues which prohibited therapy;Fatigue/lethargy limiting ability to participate. Per discussion with RN pt is somnolent, very difficult to arouse. PT will attempt to return tomorrow if pt is more alert.   [name] 03/02/2020, 3:27 PM

## 2020-03-03 ENCOUNTER — Inpatient Hospital Stay (HOSPITAL_COMMUNITY): Payer: Medicare HMO

## 2020-03-03 DIAGNOSIS — R1312 Dysphagia, oropharyngeal phase: Secondary | ICD-10-CM | POA: Diagnosis not present

## 2020-03-03 DIAGNOSIS — I4892 Unspecified atrial flutter: Secondary | ICD-10-CM | POA: Diagnosis not present

## 2020-03-03 DIAGNOSIS — E854 Organ-limited amyloidosis: Secondary | ICD-10-CM | POA: Diagnosis not present

## 2020-03-03 LAB — CBC WITH DIFFERENTIAL/PLATELET
Abs Immature Granulocytes: 0.42 10*3/uL — ABNORMAL HIGH (ref 0.00–0.07)
Basophils Absolute: 0 10*3/uL (ref 0.0–0.1)
Basophils Relative: 0 %
Eosinophils Absolute: 0.1 10*3/uL (ref 0.0–0.5)
Eosinophils Relative: 1 %
HCT: 29.2 % — ABNORMAL LOW (ref 36.0–46.0)
Hemoglobin: 9.2 g/dL — ABNORMAL LOW (ref 12.0–15.0)
Immature Granulocytes: 2 %
Lymphocytes Relative: 4 %
Lymphs Abs: 0.7 10*3/uL (ref 0.7–4.0)
MCH: 31.7 pg (ref 26.0–34.0)
MCHC: 31.5 g/dL (ref 30.0–36.0)
MCV: 100.7 fL — ABNORMAL HIGH (ref 80.0–100.0)
Monocytes Absolute: 1.4 10*3/uL — ABNORMAL HIGH (ref 0.1–1.0)
Monocytes Relative: 7 %
Neutro Abs: 16.7 10*3/uL — ABNORMAL HIGH (ref 1.7–7.7)
Neutrophils Relative %: 86 %
Platelets: 52 10*3/uL — ABNORMAL LOW (ref 150–400)
RBC: 2.9 MIL/uL — ABNORMAL LOW (ref 3.87–5.11)
RDW: 19.6 % — ABNORMAL HIGH (ref 11.5–15.5)
WBC: 19.3 10*3/uL — ABNORMAL HIGH (ref 4.0–10.5)
nRBC: 0.2 % (ref 0.0–0.2)

## 2020-03-03 LAB — BASIC METABOLIC PANEL
Anion gap: 11 (ref 5–15)
BUN: 49 mg/dL — ABNORMAL HIGH (ref 8–23)
CO2: 18 mmol/L — ABNORMAL LOW (ref 22–32)
Calcium: 10 mg/dL (ref 8.9–10.3)
Chloride: 123 mmol/L — ABNORMAL HIGH (ref 98–111)
Creatinine, Ser: 0.94 mg/dL (ref 0.44–1.00)
GFR calc Af Amer: >60 mL/min (ref >60)
GFR calc non Af Amer: >60 mL/min (ref >60)
Glucose, Bld: 243 mg/dL — ABNORMAL HIGH (ref 70–99)
Potassium: 4.6 mmol/L (ref 3.5–5.1)
Sodium: 152 mmol/L — ABNORMAL HIGH (ref 135–145)

## 2020-03-03 LAB — SODIUM
Sodium: 149 mmol/L — ABNORMAL HIGH (ref 135–145)
Sodium: 152 mmol/L — ABNORMAL HIGH (ref 135–145)
Sodium: 153 mmol/L — ABNORMAL HIGH (ref 135–145)

## 2020-03-03 LAB — TRIGLYCERIDES: Triglycerides: 318 mg/dL — ABNORMAL HIGH (ref <150)

## 2020-03-03 LAB — HEPARIN INDUCED PLATELET AB (HIT ANTIBODY): Heparin Induced Plt Ab: 1.469 OD — ABNORMAL HIGH (ref 0.000–0.400)

## 2020-03-03 LAB — GLUCOSE, CAPILLARY
Glucose-Capillary: 136 mg/dL — ABNORMAL HIGH (ref 70–99)
Glucose-Capillary: 169 mg/dL — ABNORMAL HIGH (ref 70–99)
Glucose-Capillary: 191 mg/dL — ABNORMAL HIGH (ref 70–99)
Glucose-Capillary: 195 mg/dL — ABNORMAL HIGH (ref 70–99)
Glucose-Capillary: 231 mg/dL — ABNORMAL HIGH (ref 70–99)
Glucose-Capillary: 235 mg/dL — ABNORMAL HIGH (ref 70–99)

## 2020-03-03 LAB — RETICULOCYTES
Immature Retic Fract: 21.7 % — ABNORMAL HIGH (ref 2.3–15.9)
RBC.: 2.86 MIL/uL — ABNORMAL LOW (ref 3.87–5.11)
Retic Count, Absolute: 76.4 10*3/uL (ref 19.0–186.0)
Retic Ct Pct: 2.7 % (ref 0.4–3.1)

## 2020-03-03 LAB — ADAMTS13 ACTIVITY REFLEX

## 2020-03-03 LAB — APTT
aPTT: 53 seconds — ABNORMAL HIGH (ref 24–36)
aPTT: 48 seconds — ABNORMAL HIGH (ref 24–36)

## 2020-03-03 LAB — ADAMTS13 ACTIVITY: Adamts 13 Activity: 78.5 % (ref >66.8)

## 2020-03-03 MED ORDER — IOHEXOL 300 MG/ML  SOLN
100.0000 mL | Freq: Once | INTRAMUSCULAR | Status: AC | PRN
  Administered 2020-03-03: 13:00:00 100 mL via INTRAVENOUS

## 2020-03-03 MED ORDER — INSULIN ASPART 100 UNIT/ML ~~LOC~~ SOLN: 3.0000 [IU] | SUBCUTANEOUS | Status: DC

## 2020-03-03 MED ORDER — INSULIN ASPART 100 UNIT/ML ~~LOC~~ SOLN
2.0000 [IU] | SUBCUTANEOUS | Status: DC
  Administered 2020-03-03 – 2020-03-06 (×18): 2 [IU] via SUBCUTANEOUS

## 2020-03-03 MED ORDER — SODIUM CHLORIDE 3 % IV BOLUS
200.0000 mL | Freq: Once | INTRAVENOUS | Status: AC
  Administered 2020-03-03: 21:00:00 200 mL via INTRAVENOUS
  Filled 2020-03-03: qty 200

## 2020-03-03 NOTE — Progress Notes (Signed)
Physical Therapy Treatment Patient Details Name: [name] MRN: [id number] DOB: [birth year] Today's Date: 03/03/2020    History of Present Illness 74 yo female presented with persistent HA, nausea and vomiting pt with stable R lateral ventricle IVH. Pt with acute bil PE 4/13, IVC filter placed 4/14. 4/15 head CT demonstrated deep cerebral thrombosis s/p IR for thrombectomy with expressive aphasia after. 4/16 MRI showed multiple cerebral and cerebellar infarcts, Rt temporal infarct with cytotoxic edema and hypertonic saline started. PMHx: HTN, HLD, hep C, hypothyroidism    PT Comments    PT/OT co session to help with safe mobility.  Pt was less resistive to efforts at mobilizing EOB.  She responded positively to music (helped her relax and not be as resistant during our seated session).  She was not able to fully stand due to difficulty initiating power up, but we attempted x2 from elevated bed before repositioning in supine.  Her eyes were mostly closed throughout session and she was responding in moans.  PT will continue to follow acutely for safe mobility progression.   Follow Up Recommendations  CIR;Supervision/Assistance - 24 hour     Equipment Recommendations  Wheelchair (measurements PT);Wheelchair cushion (measurements PT);3in1 (PT);Hospital bed    Recommendations for Other Services   NA     Precautions / Restrictions Precautions Precautions: Fall Precaution Comments: SBP <140, expressive/receptive difficulties     Mobility  Bed Mobility Overal bed mobility: Needs Assistance Bed Mobility: Supine to Sit;Sit to Supine     Supine to sit: +2 for safety/equipment;Max assist;HOB elevated Sit to supine: +2 for safety/equipment;Max assist   General bed mobility comments: Two person max assist to come to sitting EOB, mostly manual/tactile cues to come up.  Pt not as resistive as past sessions, but does tend to push.  Transfers Overall transfer level: Needs  assistance Equipment used: 2 person hand held assist Transfers: Sit to/from Stand Sit to Stand: +2 physical assistance;From elevated surface;Max assist         General transfer comment: Attepmted partial stand EOB, however, pt not initiating push up to stand well, bil knees blocked.       Modified Rankin (Stroke Patients Only) Modified Rankin (Stroke Patients Only) Pre-Morbid Rankin Score: No symptoms Modified Rankin: Severe disability     Balance Overall balance assessment: Needs assistance Sitting-balance support: Feet supported;Bilateral upper extremity supported;No upper extremity supported;Single extremity supported Sitting balance-Leahy Scale: Zero Sitting balance - Comments: up to total assist EOB pushing initially R, but would switch to pushing left at times, music seemed to help her relax and work with OT who was in front of her during session.  See OT notes for seated activities.    Standing balance support: Bilateral upper extremity supported Standing balance-Leahy Scale: Zero Standing balance comment: unable to fully stand with max assist.                             Cognition Arousal/Alertness: Lethargic Behavior During Therapy: Restless Overall Cognitive Status: Impaired/Different from baseline Area of Impairment: Orientation;Attention;Following commands;Awareness;Problem solving                 Orientation Level: Disoriented to;Person;Place;Time;Situation Current Attention Level: Focused   Following Commands: (does not follow commands today) Safety/Judgement: Decreased awareness of safety;Decreased awareness of deficits Awareness: Intellectual Problem Solving: Decreased initiation;Difficulty sequencing;Requires verbal cues;Requires tactile cues General Comments: Pt moaning, eyes mostly closed during session, restless, impulsive, not able to follow any commands.  Pertinent Vitals/Pain Pain Assessment: Faces Faces Pain  Scale: Hurts little more Pain Location: generalized Pain Descriptors / Indicators: Moaning Pain Intervention(s): Limited activity within patient's tolerance;Monitored during session;Repositioned           PT Goals (current goals can now be found in the care plan section) Acute Rehab PT Goals Patient Stated Goal: not able to speak, only moan Progress towards PT goals: Progressing toward goals    Frequency    Min 3X/week(until she participates more then increase freq)      PT Plan Current plan remains appropriate    Co-evaluation PT/OT/SLP Co-Evaluation/Treatment: Yes Reason for Co-Treatment: Complexity of the patient's impairments (multi-system involvement);Necessary to address cognition/behavior during functional activity;For patient/therapist safety;To address functional/ADL transfers PT goals addressed during session: Mobility/safety with mobility;Balance;Strengthening/ROM        AM-PAC PT "6 Clicks" Mobility   Outcome Measure  Help needed turning from your back to your side while in a flat bed without using bedrails?: Total Help needed moving from lying on your back to sitting on the side of a flat bed without using bedrails?: Total Help needed moving to and from a bed to a chair (including a wheelchair)?: Total Help needed standing up from a chair using your arms (e.g., wheelchair or bedside chair)?: Total Help needed to walk in hospital room?: Total Help needed climbing 3-5 steps with a railing? : Total 6 Click Score: 6    End of Session   Activity Tolerance: Patient limited by pain;Patient limited by lethargy Patient left: in bed;with call bell/phone within reach;with bed alarm set Nurse Communication: Mobility status PT Visit Diagnosis: Other abnormalities of gait and mobility (R26.89);Pain;Other symptoms and signs involving the nervous system (R29.898);Difficulty in walking, not elsewhere classified (R26.2)     Time: [id number] PT Time Calculation (min)  (ACUTE ONLY): 24 min  Charges:  $Neuromuscular Re-education: 8-22 mins                    Verdene Lennert, PT, DPT  Acute Rehabilitation [phone] pager #[phone] office     03/03/2020, 5:03 PM

## 2020-03-03 NOTE — Progress Notes (Signed)
[name]   DOB:30-Sep-1946   JQ#:[id number]   TMA#:[id number]  Subjective:  [name] was having some grimacing and moaning as her blood sugar was checked this morning.  She did not respond verbally to voice or touch when I approached her and did not open her eyes.  However her husband tells me that this morning she did seem to recognize him and was more responsive.  This was very encouraging to him.  Her amiodarone was discontinued yesterday. She seems to have remained in sinus rhythm.   Objective: white woman examined in bed, restraints in place Vitals:   03/03/20 0800 03/03/20 1200  BP:    Pulse: 95   Resp: 17   Temp: 98.4 F (36.9 C) 98.2 F (36.8 C)  SpO2: 100%     Body mass index is 29.07 kg/m.  Intake/Output Summary (Last 24 hours) at 03/03/2020 1348 Last data filed at 03/03/2020 0800 Gross per 24 hour  Intake 2065.06 ml  Output 425 ml  Net 1640.06 ml     CBG (last 3)  Recent Labs    03/03/20 0325 03/03/20 0802 03/03/20 1111  GLUCAP 191* 195* 231*     Labs:  Lab Results  Component Value Date   WBC 19.3 (H) 03/03/2020   HGB 9.2 (L) 03/03/2020   HCT 29.2 (L) 03/03/2020   MCV 100.7 (H) 03/03/2020   PLT 52 (L) 03/03/2020   NEUTROABS 16.7 (H) 03/03/2020    '@LASTCHEMISTRY' @  Urine Studies No results for input(s): UHGB, CRYS in the last 72 hours.  Invalid input(s): UACOL, UAPR, USPG, UPH, UTP, UGL, UKET, UBIL, UNIT, UROB, ULEU, UEPI, UWBC, URBC, UBAC, CAST, [place], [place]  Basic Metabolic Panel: Recent Labs  Lab 02/28/20 0416 02/28/20 0920 02/28/20 1233 02/28/20 1550 02/28/20 2127 02/29/20 0506 02/29/20 0926 02/29/20 1607 02/29/20 2035 03/01/20 0456 03/01/20 1239 03/02/20 0429 03/02/20 0844 03/02/20 1549 03/02/20 2356 03/03/20 0501  NA 147*   < >  --  149*   < > 156*   < > 156*   < > 154*   < > 152* 152* 152* 153* 152*  K 3.0*  --   --   --   --  3.6  --   --   --  3.2*  --  4.2  --   --   --  4.6  CL 116*  --   --   --   --  >130*  --   --    --  127*  --  125*  --   --   --  123*  CO2 16*  --   --   --   --  16*  --   --   --  18*  --  20*  --   --   --  18*  GLUCOSE 149*  --   --   --   --  188*  --   --   --  243*  --  229*  --   --   --  243*  BUN 12  --   --   --   --  24*  --   --   --  32*  --  38*  --   --   --  49*  CREATININE 0.81  --   --   --   --  0.83  --   --   --  0.88  --  0.89  --   --   --  0.94  CALCIUM 9.1  --   --   --   --  9.3  --   --   --  9.7  --  9.8  --   --   --  10.0  MG  --   --  2.2 2.1  --  2.0  --  2.1  --   --   --   --   --   --   --   --   PHOS  --   --  2.6 2.4*  --  2.4*  --  2.8  --   --   --   --   --   --   --   --    < > = values in this interval not displayed.   GFR Estimated Creatinine Clearance: 49.6 mL/min (by C-G formula based on SCr of 0.94 mg/dL). Liver Function Tests: Recent Labs  Lab 02/29/20 0506  AST 26  ALT 46*  ALKPHOS 97  BILITOT 0.5  PROT 5.4*  ALBUMIN 2.9*   No results for input(s): LIPASE, AMYLASE in the last 168 hours. No results for input(s): AMMONIA in the last 168 hours. Coagulation profile No results for input(s): INR, PROTIME in the last 168 hours.  CBC: Recent Labs  Lab 02/26/20 0259 02/26/20 0818 02/28/20 0416 02/28/20 0416 02/29/20 0506 03/01/20 0458 03/02/20 0429 03/02/20 0844 03/03/20 0501  WBC 11.2*   < > 14.0*  --  13.6* 13.6* 12.7*  --  19.3*  NEUTROABS 9.0*  --   --   --   --   --   --   --  16.7*  HGB 10.6*   < > 9.8*  --  8.6* 8.7* 8.8*  --  9.2*  HCT 32.5*   < > 30.5*  --  27.5* 27.8* 28.3*  --  29.2*  MCV 92.9   < > 94.1  --  95.5 97.5 100.0  --  100.7*  PLT 38*   < > 100*   < > 139* 95* 45* 36* 52*   < > = values in this interval not displayed.   Cardiac Enzymes: No results for input(s): CKTOTAL, CKMB, CKMBINDEX, TROPONINI in the last 168 hours. BNP: Invalid input(s): POCBNP CBG: Recent Labs  Lab 03/02/20 1919 03/02/20 2310 03/03/20 0325 03/03/20 0802 03/03/20 1111  GLUCAP 204* 238* 191* 195* 231*   D-Dimer No  results for input(s): DDIMER in the last 72 hours. Hgb A1c No results for input(s): HGBA1C in the last 72 hours. Lipid Profile Recent Labs    03/03/20 0501  TRIG 318*   Thyroid function studies No results for input(s): TSH, T4TOTAL, T3FREE, THYROIDAB in the last 72 hours.  Invalid input(s): FREET3 Anemia work up Recent Labs    03/03/20 0501  RETICCTPCT 2.7   Microbiology No results found for this or any previous visit (from the past 240 hour(s)).    Studies:  CT HEAD WO CONTRAST  Result Date: 03/02/2020 CLINICAL DATA:  74 year old female with complicated medical history of intraventricular hemorrhage, dural venous sinus thrombosis (including the vein of Galen), PE, and subsequently heparin induced thrombocytopenia. Intracranial hemorrhage. EXAM: CT HEAD WITHOUT CONTRAST TECHNIQUE: Contiguous axial images were obtained from the base of the skull through the vertex without intravenous contrast. COMPARISON:  Brain MRI and head MRV 02/29/2020 and earlier. FINDINGS: Brain: The recent confluent gyriform hemorrhage in the lateral left temporal and occipital lobes demonstrated by MRI has a mixed hyperdense and intermediate density petechial type  appearance on series 3, image 13. Associated confluent white matter hypodensity compatible with associated cerebral edema. And there is on going superimposed bilateral white matter and deep gray matter edema suspected in the setting dural venous sinus and deep venous thrombosis. No superimposed new cortically based infarct identified. Stable relatively small volume intraventricular hemorrhage most pronounced in the atrium of the right lateral ventricle. No definite extra-axial hemorrhage otherwise. No associated midline shift. Mild mass effect on the left lateral ventricle but no ventriculomegaly. Basilar cisterns remain patent. Vascular: Calcified atherosclerosis at the skull base. Continued conspicuous hyperdensity of the straight sinus and vein of Galen  compatible with deep venous thrombosis (sagittal images 25 and 26). Skull: No acute osseous abnormality identified. Sinuses/Orbits: Visualized paranasal sinuses and mastoids are stable and generally well pneumatized. Other: Left nasoenteric tube in place. Negative orbit and scalp soft tissues. IMPRESSION: 1. Recent confluent but petechial type hemorrhage in the lateral left temporal and occipital lobes, stable from the MRI 02/29/2020 with associated increased left hemisphere edema. 2. Stable IVH.  No new intracranial hemorrhage. 3. Only mild intracranial mass effect at this time without midline shift or loss of basilar cisterns. 4. Continued deep venous thrombosis with ongoing hyperdensity of the straight sinus and vein of Galen. 5. Suspect associated bilateral white matter and deep gray matter nuclei edema secondary to #4. Electronically Signed   By: Genevie Ann M.D.   On: 03/02/2020 10:32    Assessment: 74 y.o. [place] woman presenting 02/13/2020 with headache, found to have biventricular bleeds in the setting of panmyelosis, subsequently with pulmonary emboli and sinus venous thromboses in the setting of heparin induced thrombocytopenia  (1) polycythemia vera  (a) JAK2 mutation positive confirming diagnosis  (b) bone marrow biopsy 02/13/2020 shows no evidence of leukemia, c/w P Vera  (c)  leukapheresis x3 . Last leukapheresis Friday 02/18/2020   (i) HD catheter removed after pheresis on  02/18/2020  (d) hydrea as needed to keep HCT <45 and platelets in normal range   (2) heparin induced thrombocytopenia: positive HIT screen  (a) severe thrombocythemia noted 02/24/2020  (b) bilateral pulmonary emboli 02/22/2020   (i) s/p IVC filter placement 02/22/2020   (ii) atrial fibrillation with RVR  (c) venous sinus thromboses 02/24/2020   (i) s/p mechanical thrombectomy  (d) argatroban started 02/25/2020  (e) platelet count responded to heparin withdrawal  (f) confirmatory serotonin release assay  negative (see discussion below)   (i) continue argatroban for anticoagulation, no heparin   (ii) assay being repeated in a different lab  (3) drug-induced thrombocytopenia (see discussion below)  (a) REVIEW OF BLOOD FILM 04/22/2021shows no schistocytes, no red cell abnormalities; leukocytosis with a minimal left shift; no platelet clumps -- this makes DIC and TTP very unlikely  (b) patient is receiving high-dose steroids, which makes ITP unlikely  (c) amiodarone discontinued 03/02/2020  Plan:  Platelets are better today.  Whether that is because the amiodarone was discontinued or because perhaps patient had been receiving inadvertent flush doses of heparin is unclear.  In any case I am encouraged by the change and hope it will continuing the same direction over the next several days.  If and when the patient's hematologic condition stabilizes we could switch her from argatroban to a DOAC.  I would wait on that transition however until the overall situation is more stable and her prognosis clear  [name] remains at risk for both clotting and bleeding complications.  Her husband is aware that things can take a turn for the worse,  but we are hoping with continuing support she will pull-through and we will see improvement over the next several days  I will continue to follow with you    Chauncey Cruel, MD 03/03/2020  1:48 PM Medical Oncology and Hematology [facility] [place], [place] [address] Tel. [phone]    Fax. [phone]

## 2020-03-03 NOTE — Progress Notes (Signed)
SLP Cancellation Note  Patient Details Name: SHANKIA AUSTELL MRN: [id number] DOB: [birth year]   Cancelled treatment:       Reason Eval/Treat Not Completed: Fatigue/lethargy limiting ability to participate. SLP will continue to follow.  Roi Jafari L. Tivis Ringer, [place] CCC/SLP Acute Rehabilitation Services Office number [phone] Pager [phone]    Assunta Curtis 03/03/2020, 2:51 PM

## 2020-03-03 NOTE — Progress Notes (Signed)
Occupational Therapy Treatment Patient Details Name: [name] MRN: [id number] DOB: 03-03-1946 Today's Date: 03/03/2020    History of present illness 74 yo female presented with persistent HA, nausea and vomiting pt with stable R lateral ventricle IVH. Pt with acute bil PE 4/13, IVC filter placed 4/14. 4/15 head CT demonstrated deep cerebral thrombosis s/p IR for thrombectomy with expressive aphasia after. 4/16 MRI showed multiple cerebral and cerebellar infarcts, Rt temporal infarct with cytotoxic edema and hypertonic saline started. PMHx: HTN, HLD, hep C, hypothyroidism   OT comments  Co-session with PT for safety. Pt presenting with restlessness, decreased arousal, and poor following of commands. Pt requiring Max A +2 for bed mobility. Tolerating sitting at EOB with Max A +2. Facilitating AAROM of BUEs and trunk rotation. Requiring Max hand over hand to wash face. Pt seeming to respond well to music Laqueta Linden) becoming less restless. Attempting sit<>stand but pt with decreased power up and following of commands. Continue to recommend dc to CIR and will continue to follow acutely as admitted.    Follow Up Recommendations  CIR;Supervision/Assistance - 24 hour    Equipment Recommendations  3 in 1 bedside commode;Other (comment)    Recommendations for Other Services Rehab consult    Precautions / Restrictions Precautions Precautions: Fall Precaution Comments: SBP <140, expressive/receptive difficulties        Mobility Bed Mobility Overal bed mobility: Needs Assistance Bed Mobility: Supine to Sit;Sit to Supine     Supine to sit: +2 for safety/equipment;Max assist;HOB elevated Sit to supine: +2 for safety/equipment;Max assist   General bed mobility comments: Two person max assist to come to sitting EOB, mostly manual/tactile cues to come up.  Pt not as resistive as past sessions, but does tend to push.  Transfers Overall transfer level: Needs assistance Equipment  used: 2 person hand held assist Transfers: Sit to/from Stand Sit to Stand: +2 physical assistance;From elevated surface;Max assist         General transfer comment: Attepmted partial stand EOB, however, pt not initiating push up to stand well, bil knees blocked.     Balance Overall balance assessment: Needs assistance Sitting-balance support: Feet supported;Bilateral upper extremity supported;No upper extremity supported;Single extremity supported Sitting balance-Leahy Scale: Zero Sitting balance - Comments: up to total assist EOB pushing initially R, but would switch to pushing left at times, music seemed to help her relax and work with OT who was in front of her during session.  See OT notes for seated activities.    Standing balance support: Bilateral upper extremity supported Standing balance-Leahy Scale: Zero Standing balance comment: unable to fully stand with max assist.                            ADL either performed or assessed with clinical judgement   ADL Overall ADL's : Needs assistance/impaired     Grooming: Maximal assistance;Wash/dry face;Sitting Grooming Details (indicate cue type and reason): Not following commands                               General ADL Comments: Max hand over hand for washing face while sitting at EOB.     Vision       Perception     Praxis      Cognition Arousal/Alertness: Lethargic Behavior During Therapy: Restless Overall Cognitive Status: Impaired/Different from baseline Area of Impairment: Orientation;Attention;Following commands;Awareness;Problem solving  Orientation Level: Disoriented to;Person;Place;Time;Situation Current Attention Level: Focused   Following Commands: (does not follow commands today) Safety/Judgement: Decreased awareness of safety;Decreased awareness of deficits Awareness: Intellectual Problem Solving: Decreased initiation;Difficulty sequencing;Requires  verbal cues;Requires tactile cues General Comments: Pt moaning, eyes mostly closed during session, restless, impulsive, not able to follow any commands.          Exercises Exercises: Other exercises Other Exercises Other Exercises: Trunk rotation with Max A while sitting at EOB. 5 reps each.  Other Exercises: AAROM of BUEs    Shoulder Instructions       General Comments VSS    Pertinent Vitals/ Pain       Pain Assessment: Faces Faces Pain Scale: Hurts little more Pain Location: generalized Pain Descriptors / Indicators: Moaning Pain Intervention(s): Monitored during session;Limited activity within patient's tolerance;Repositioned  Home Living                                          Prior Functioning/Environment              Frequency  Min 2X/week        Progress Toward Goals  OT Goals(current goals can now be found in the care plan section)  Progress towards OT goals: Not progressing toward goals - comment  Acute Rehab OT Goals Patient Stated Goal: not able to speak, only moan OT Goal Formulation: With patient Time For Goal Achievement: 03/13/20 Potential to Achieve Goals: Good ADL Goals Pt Will Perform Grooming: sitting;with supervision Pt Will Perform Upper Body Dressing: with set-up;with supervision;sitting Additional ADL Goal #1: Pt will follow 1 step commands with 75% accuracy and no more than min cueing. Additional ADL Goal #2: Pt will perform bed mobility with minguard assist as precursor to ADL. Additional ADL Goal #3: pt will complete bed mobility MODI as precursor to adls  Plan Discharge plan needs to be updated    Co-evaluation    PT/OT/SLP Co-Evaluation/Treatment: Yes Reason for Co-Treatment: For patient/therapist safety;To address functional/ADL transfers PT goals addressed during session: Mobility/safety with mobility;Balance;Strengthening/ROM OT goals addressed during session: ADL's and self-care      AM-PAC OT "6  Clicks" Daily Activity     Outcome Measure   Help from another person eating meals?: Total Help from another person taking care of personal grooming?: A Lot Help from another person toileting, which includes using toliet, bedpan, or urinal?: Total Help from another person bathing (including washing, rinsing, drying)?: A Lot Help from another person to put on and taking off regular upper body clothing?: A Lot Help from another person to put on and taking off regular lower body clothing?: Total 6 Click Score: 9    End of Session    OT Visit Diagnosis: Unsteadiness on feet (R26.81);Muscle weakness (generalized) (M62.81);Other symptoms and signs involving cognitive function;Other symptoms and signs involving the nervous system (R29.898)   Activity Tolerance Patient limited by fatigue   Patient Left in bed;with call bell/phone within reach;with bed alarm set;with restraints reapplied   Nurse Communication Mobility status        Time: [id number] OT Time Calculation (min): 28 min  Charges: OT General Charges $OT Visit: 1 Visit OT Treatments $Therapeutic Activity: 8-22 mins  [place], OTR/L Acute Rehab Pager: [phone] Office: [place] 03/03/2020, 5:49 PM

## 2020-03-03 NOTE — Progress Notes (Signed)
STROKE TEAM PROGRESS NOTE   INTERVAL HISTORY Husband at bedside.  Patient still moaning, restless and grimacing during rounds.  Will give Ativan as needed as ordered.  Sodium 152.  Platelet improved to 52.  Still has hyperglycemia due to steroids use, covered with SSI, will add NovoLog scheduled.  Given her persistent restless, will check CT chest pelvis and abdomen.  Vitals:   03/03/20 0716 03/03/20 0730 03/03/20 0745 03/03/20 0800  BP: (!) 139/55 (!) 156/80 (!) 159/45   Pulse: (!) 102 (!) 107 (!) 102 95  Resp: 19 19 18 17   Temp:    98.4 F (36.9 C)  TempSrc:    Axillary  SpO2: 100% 100% 100% 100%  Weight:      Height:       CBC:  Recent Labs  Lab 02/26/20 0259 02/26/20 0818 03/02/20 0429 03/02/20 0429 03/02/20 0844 03/03/20 0501  WBC 11.2*   < > 12.7*  --   --  19.3*  NEUTROABS 9.0*  --   --   --   --  16.7*  HGB 10.6*   < > 8.8*  --   --  9.2*  HCT 32.5*   < > 28.3*  --   --  29.2*  MCV 92.9   < > 100.0  --   --  100.7*  PLT 38*   < > 45*   < > 36* 52*   < > = values in this interval not displayed.   Basic Metabolic Panel:  Recent Labs  Lab 02/29/20 0506 02/29/20 0926 02/29/20 1607 02/29/20 2035 03/02/20 0429 03/02/20 0844 03/02/20 2356 03/03/20 0501  NA 156*   < > 156*   < > 152*   < > 153* 152*  K 3.6  --   --    < > 4.2  --   --  4.6  CL >130*  --   --    < > 125*  --   --  123*  CO2 16*  --   --    < > 20*  --   --  18*  GLUCOSE 188*  --   --    < > 229*  --   --  243*  BUN 24*  --   --    < > 38*  --   --  49*  CREATININE 0.83  --   --    < > 0.89  --   --  0.94  CALCIUM 9.3  --   --    < > 9.8  --   --  10.0  MG 2.0  --  2.1  --   --   --   --   --   PHOS 2.4*  --  2.8  --   --   --   --   --    < > = values in this interval not displayed.   Lipid Panel:     Component Value Date/Time   CHOL 172 02/14/2020 0959   TRIG 318 (H) 03/03/2020 0501   HDL 71 02/14/2020 0959   CHOLHDL 2.4 02/14/2020 0959   VLDL 9 02/14/2020 0959   LDLCALC 92 02/14/2020  0959   HgbA1c:  Lab Results  Component Value Date   HGBA1C 6.1 (H) 02/14/2020    IMAGING past 24 hours No results found.   PHYSICAL EXAM    General - Well nourished, well developed, restless and agitated with exam  Ophthalmologic - fundi not visualized due  to noncooperation.  Cardiovascular - Regular rhythm and rate, not in afib.  Neuro - restless, agitated with exam, but drowsy without stimulation, not open eyes on voice or pain, moaning with stimulation, not following commands. She resisted eye opening and difficult to exam pupils, facial symmetrical. BUE against gravity, symmetrical. BLE 2+/5 withdraw to pain, symmetrical. DTR 1+, no babinski. Sensation, coordination not cooperative and gait not tested.   ASSESSMENT/PLAN Ms. LAJOYA MCMEANS is a 74 y.o. female with history of HTN presenting with severe persistent HA accompanied by nausea and vomiting. This has now improved.   IVH - right lateral ventricle IVH secondary to ? CAA vs. HTN Stroke: multiple punctate B anterior and posterior circulation embolic infarcts in setting of PAF  CT head 4/5 0032 R lateral IVH. Severe chronic ischemic microangiopathy.  MRI 4/5 Stable IVH, severe leukoaraiosis. Multiple foci throughout progressed from previous MRI d/t microangiopathy vs amyloid.  MRA unremarkable   CT head repeat 4/5-4/10 - unchanged size and configuration of right IVH  CT Head 02/26/20 - Increased edema in the left greater than right temporal and occipital lobes which may reflect worsening edema from venous hypertension and venous infarcts.   CT head 02/28/20 decreased L>R edema. IVH same. Known SVT.    MRI 4/20 - left temporal and parietal significant petechial hemorrhage, likely due to significant CVST. Numerous punctate infarcts throughout brain. Numerous chronic microhemorrhages c/w CAA.  CT head 4/22 stable L temporal and occipital petechial hemorrhage. Stable IVH. Mild intracranial mass effect no midline shift.  CSVT stable w/ edema.  EEG - moderate diffuse encephalopathy  2D Echo EF 65-70%. No source of embolus   LDL 92   HgbA1c 6.1   DVT prophylaxis - argatroban IV   aspirin 81 mg daily prior to admission, now on argatroban due to HIT -:Plan AC for at least 6 mos. Switch to DOAC at d/c  Therapy recommendations:  CIR  Disposition:  pending  Pancytosis due to polycythemia vera   WBC 12->24.4->...->12.7->19.37 (afebrile)  Hgb 17.1->18.6->...->8.8->9.2   PLT 990->...->45->36->52   Bone marrow bx done, results no leukemia, consistent with polycythemia vera   Treated with hydrea and allopurinol, now off  S/p Phlebotomy and pheresis x 3  Erythropoietin level 1.3 (L)  JAK2 positive for V617F mutation  Re-eval 4/22 w/ PLT drop - felt to be medication related - seroquel and fiorecet stopped. Dr. Ron Agee reaching out to Dr. Gwenlyn Found to discuss stopping amiodarone   LDH 427 PLT ct c/w citrate adamts 13 activity pending HIT ab pending reticulocytes 76.4  Dr. Ron Agee on board   HIT syndrome  B/l PE cerebral venous sinus thrombosis s/p IR ? Drug induced thrombocytopenia  4/13 New SOB and tachycardia  CTA chest - BLE PE.   Filter placement on 4/13 as unable to get [facility] d/t IVH  4/15 CT head - straight sinus and vein of Galan CSVT  CTV Confirmed extensive thrombus within the confluence of sinuses and straight sinus, also extending into the vein of Galen. There is also fairly extensive multifocal thrombus within the right greater than left transverse and sigmoid dural venous sinuses.  S/p mechanical thrombectomy 02/24/20 of occluded deep cerebral veins  MRI 02/25/2020 shows multiple by cerebral and cerebellar infarcts as well as right temporal cortical venous infarct and cytotoxic edema  MRV persistent occlusion of 3 sinus, vein of Galan, right IJ, right basal vein of Rosenthal  CT head 4/17 increase edema in the left greater than right temporal and occipital lobes  CT  head 4/19  decreased L>R edema. IVH same. Known SVT.  MRV 4/20 - dural venous sinus unchanged.   MRI 4/20 - left temporal and parietal significant petechial hemorrhage, likely due to significant CVST.   CT head 4/22 stable L temporal and occipital petechial hemorrhage. Stable IVH. Mild intracranial mass effect no midline shift. DVT stable w/ edema.  HIT antibody positive  On argatroban IV -> continue, may switch to DOAC later. AC at least 6 months  Decadron 10mg  x 1 -> 4mg  q6  Off 3% saline   Na Q 6 hrs  Na goal 150 - 155  Na 142->...->152->153-?152  Repeat HIT ab pending   D/c potential offending meds - amiodraone, oxycodone, fioricet, imodium, seroquel  PLT 990->...->45->36->52    Check CT chest, abd/pelvis to rue out any possible pathology  Headache, due to IVH and CSVT  Depakote 500 mg nightly discontinued  Pt now has Fioricet one Q8 hrs prn as well as tylenol prn  Solumedrol 500 mg -> prednisone -> off  NS at 100 cc's / hr started 4/10 -> 3% saline  Add topamax 25 bid  Decadron started 10 mg IV x 1 then 4 mg IV Q 6 hrs  CT head 4/17 increase edema in the left greater than right temporal and occipital lobes  CT head 4/19 decreased L>R edema. IVH same. Known SVT.  MRI and MRV4/20 dural venous sinus unchanged. left temporal and parietal significant petechial hemorrhage, likely due to significant CVST.   CT head 4/22  stable L temporal and occipital petechial hemorrhage. Stable IVH. Mild intracranial mass effect no midline shift. CSVT stable w/ edema.  Continue steroids until swelling decreasing  Afib RVR, new diagnosis  Rate in 160's -> back to sinus  Diltizem gtt discontinued  On amiodarone  Amiodarone d/c'ed in concern of drug induced thrombocytopenia  TSH 5.311  Free T4 normal  OK with AC for 6 mos only due to acute illness and no recurrence of afib. Switch to DOAC once more stable  Cardiology signed off 02/26/20  Stopped amiodarone  4/22  Dysphagia  NPO status due to altered mental status  cortrak placed under fluoro 02/26/20 -> pt pulled off same day  cortrak replaced 4/19  On TF @ 60cc  SLP on board  Hypertension  Home meds:  losartan 100, amlodipine 5  On losartan 50 bid and amlodipine 10 and hydralazine 100 tid . SBP goal < 140  off Cleviprex now   BP Stable  . 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 Multiple foci throughout progressed from previous MRI d/t microangiopathy vs amyloid.  As per husband, pt has some anomia as baseline  BP goal < 140  Hyperlipidemia  Home meds:  lipitor 10  LDL 92  Statin held in setting of acute ICH  Consider continuation of statin at discharge  Hyperglycemia  On steroids  HgbA1c 6.1  CBGs q4h  SSI 0-15  Add novolog 2u q4h  Hypokalemia and Hypomagnesemia and Hypophosphatemia  Potassium 3.0->...->4.2->4.6 - supplement   Magnesium 1.6->...->2.0  Phosphorus 2.3->...->2.4   Other Stroke Risk Factors  Advanced age  Former Cigarette smoker  Family hx stroke (mother, father, and multiple other family members)  Other Active Problems  Hepatitis C  Hypothyroid on synthroid. TSH WNL - resume synthroid. Free T4 normal  Urinary retention, foley placed  Diarrhea  with TF - on imodium PRN - now off  Agitation. Seroquel stopped d/t lowering PLTs. No ativan over night.   Hospital day # 18  This patient is critically ill and at significant risk of neurological worsening, death and care requires constant monitoring of vital signs, hemodynamics,respiratory and cardiac monitoring, extensive review of multiple databases, frequent neurological assessment, discussion with family, other specialists and medical decision making of high complexity. I spent 45 minutes of  neurocritical care time  in the care of  this patient. I had long discussion with husband at bedside, updated pt current condition, treatment plan and potential prognosis, and answered all the questions. He expressed understanding and appreciation.    Rosalin Hawking, MD PhD Stroke Neurology 03/03/2020 11:02 AM   To contact Stroke Continuity provider, please refer to [email or web address] After hours, contact General Neurology

## 2020-03-03 NOTE — Progress Notes (Signed)
[place] for argatroban Indication: HIT  Allergies  Allergen Reactions  . Erythromycin Itching and Other (See Comments)    Severe stomach pains, diarrhea  . Heparin     Likely HIT    Patient Measurements: Height: 5\' 2"  (157.5 cm) Weight: 72.1 kg (158 lb 15.2 oz) IBW/kg (Calculated) : 50.1  Vital Signs: Temp: 99 F (37.2 C) (04/23 2000) Temp Source: Oral (04/23 2000) BP: 140/74 (04/23 2000) Pulse Rate: 104 (04/23 2000)  Labs: Recent Labs     0000 03/01/20 0456 03/01/20 0458 03/01/20 1830 03/02/20 0429 03/02/20 0429 03/02/20 0844 03/02/20 1549 03/03/20 0501 03/03/20 1821  HGB   < >  --  8.7*  --  8.8*  --   --   --  9.2*  --   HCT  --   --  27.8*  --  28.3*  --   --   --  29.2*  --   PLT   < >  --  95*  --  45*  --  36*  --  52*  --   APTT  --  57*  --    < > 57*   < >  --  54* 53* 48*  CREATININE  --  0.88  --   --  0.89  --   --   --  0.94  --    < > = values in this interval not displayed.    Estimated Creatinine Clearance: 49.6 mL/min (by C-G formula based on SCr of 0.94 mg/dL).  Assessment: 74 year old initially admitted 4/4 with acute IVH and thrombocythemia with PLTC of 990k. Patient was ultimately diagnosed with polythemia, now s/p leukophoresis, therapeutic phlebotomy, and initiation of hydroxyurea. On 4/13 - found to have B/L PE - decision was risk of AC was too great and IVC placed. CT scan 4/15 showed extensive thrombus within confluence of sinuses and sent to IR with mechanical thrombectomy x5.  SRA resulted negative, Dr. Jana Hakim ordered repeat sent to different lab - still with working diagnosis of HIT d/t high pre-test probability. Also need to look into other causes for new TCP - though increased this am  APTT slightly low this pm at 48  Goal of Therapy:  aPTT 50 - 60 seconds Monitor platelets by anticoagulation protocol: Yes   Plan: Increase argatroban 0.3 mcg/kg/min Recheck in about 2 hours Monitor  aPTT (at 0500 and 1700), CBC and S/S of bleeding daily   Barth Kirks, PharmD, BCPS, BCCCP Clinical Pharmacist [phone]  Please check AMION for all [place] numbers  03/03/2020 9:07 PM

## 2020-03-03 NOTE — Progress Notes (Signed)
[place] for argatroban Indication: HIT  Allergies  Allergen Reactions  . Erythromycin Itching and Other (See Comments)    Severe stomach pains, diarrhea  . Heparin     Likely HIT    Patient Measurements: Height: 5\' 2"  (157.5 cm) Weight: 72.1 kg (158 lb 15.2 oz) IBW/kg (Calculated) : 50.1  Vital Signs: Temp: 98.4 F (36.9 C) (04/23 0800) Temp Source: Axillary (04/23 0800) BP: 159/45 (04/23 0745) Pulse Rate: 95 (04/23 0800)  Labs: Recent Labs     0000 03/01/20 0456 03/01/20 0458 03/01/20 1830 03/02/20 0429 03/02/20 0844 03/02/20 1549 03/03/20 0501  HGB   < >  --  8.7*  --  8.8*  --   --  9.2*  HCT  --   --  27.8*  --  28.3*  --   --  29.2*  PLT   < >  --  95*  --  45* 36*  --  52*  APTT  --  57*  --    < > 57*  --  54* 53*  CREATININE  --  0.88  --   --  0.89  --   --  0.94   < > = values in this interval not displayed.    Estimated Creatinine Clearance: 49.6 mL/min (by C-G formula based on SCr of 0.94 mg/dL).  Assessment: 74 year old initially admitted 4/4 with acute IVH and thrombocythemia with PLTC of 990k. Patient was ultimately diagnosed with polythemia, now s/p leukophoresis, therapeutic phlebotomy, and initiation of hydroxyurea. On 4/13 - found to have B/L PE - decision was risk of AC was too great and IVC placed. CT scan 4/15 showed extensive thrombus within confluence of sinuses and sent to IR with mechanical thrombectomy x5.  Hgb 9.2. Hct 29.2. Platelets 52. aPTT is 53 on argatroban 0.28 mcg/kg/min; remains therapeutic. No bleeding or issues with IV infusion noted. Reticulocytes results: RBC. 2.86, immature retic fract 21.7.  SRA resulted negative, Dr. Jana Hakim ordered repeat sent to different lab - still with working diagnosis of HIT d/t high pre-test probability. Also need to look into other causes for new TCP.   Goal of Therapy:  aPTT 50 - 60 seconds Monitor platelets by anticoagulation protocol: Yes    Plan: Continue argatroban 0.28 mcg/kg/min Monitor aPTT (at 0500 and 1700), CBC and S/S of bleeding daily   Jefm Petty PharmD Student 03/03/2020 10:08 AM

## 2020-03-04 DIAGNOSIS — I615 Nontraumatic intracerebral hemorrhage, intraventricular: Secondary | ICD-10-CM | POA: Diagnosis not present

## 2020-03-04 LAB — APTT
aPTT: 46 seconds — ABNORMAL HIGH (ref 24–36)
aPTT: 51 seconds — ABNORMAL HIGH (ref 24–36)
aPTT: 97 seconds — ABNORMAL HIGH (ref 24–36)
aPTT: 75 seconds — ABNORMAL HIGH (ref 24–36)
aPTT: 44 seconds — ABNORMAL HIGH (ref 24–36)

## 2020-03-04 LAB — CBC WITH DIFFERENTIAL/PLATELET
Abs Immature Granulocytes: 0.44 10*3/uL — ABNORMAL HIGH (ref 0.00–0.07)
Basophils Absolute: 0 10*3/uL (ref 0.0–0.1)
Basophils Relative: 0 %
Eosinophils Absolute: 0 10*3/uL (ref 0.0–0.5)
Eosinophils Relative: 0 %
HCT: 28.8 % — ABNORMAL LOW (ref 36.0–46.0)
Hemoglobin: 8.6 g/dL — ABNORMAL LOW (ref 12.0–15.0)
Immature Granulocytes: 3 %
Lymphocytes Relative: 3 %
Lymphs Abs: 0.5 10*3/uL — ABNORMAL LOW (ref 0.7–4.0)
MCH: 30.5 pg (ref 26.0–34.0)
MCHC: 29.9 g/dL — ABNORMAL LOW (ref 30.0–36.0)
MCV: 102.1 fL — ABNORMAL HIGH (ref 80.0–100.0)
Monocytes Absolute: 1.2 10*3/uL — ABNORMAL HIGH (ref 0.1–1.0)
Monocytes Relative: 7 %
Neutro Abs: 14.6 10*3/uL — ABNORMAL HIGH (ref 1.7–7.7)
Neutrophils Relative %: 87 %
Platelets: 73 10*3/uL — ABNORMAL LOW (ref 150–400)
RBC: 2.82 MIL/uL — ABNORMAL LOW (ref 3.87–5.11)
RDW: 19.9 % — ABNORMAL HIGH (ref 11.5–15.5)
WBC: 16.7 10*3/uL — ABNORMAL HIGH (ref 4.0–10.5)
nRBC: 0.2 % (ref 0.0–0.2)

## 2020-03-04 LAB — BASIC METABOLIC PANEL
Anion gap: 10 (ref 5–15)
BUN: 47 mg/dL — ABNORMAL HIGH (ref 8–23)
CO2: 20 mmol/L — ABNORMAL LOW (ref 22–32)
Calcium: 9.8 mg/dL (ref 8.9–10.3)
Chloride: 123 mmol/L — ABNORMAL HIGH (ref 98–111)
Creatinine, Ser: 0.8 mg/dL (ref 0.44–1.00)
GFR calc Af Amer: >60 mL/min (ref >60)
GFR calc non Af Amer: >60 mL/min (ref >60)
Glucose, Bld: 191 mg/dL — ABNORMAL HIGH (ref 70–99)
Potassium: 4.5 mmol/L (ref 3.5–5.1)
Sodium: 153 mmol/L — ABNORMAL HIGH (ref 135–145)

## 2020-03-04 LAB — GLUCOSE, CAPILLARY
Glucose-Capillary: 156 mg/dL — ABNORMAL HIGH (ref 70–99)
Glucose-Capillary: 211 mg/dL — ABNORMAL HIGH (ref 70–99)
Glucose-Capillary: 167 mg/dL — ABNORMAL HIGH (ref 70–99)
Glucose-Capillary: 186 mg/dL — ABNORMAL HIGH (ref 70–99)
Glucose-Capillary: 217 mg/dL — ABNORMAL HIGH (ref 70–99)
Glucose-Capillary: 208 mg/dL — ABNORMAL HIGH (ref 70–99)

## 2020-03-04 LAB — SODIUM
Sodium: 154 mmol/L — ABNORMAL HIGH (ref 135–145)
Sodium: 155 mmol/L — ABNORMAL HIGH (ref 135–145)

## 2020-03-04 NOTE — Progress Notes (Signed)
[place] for argatroban Indication: HIT  Allergies  Allergen Reactions  . Erythromycin Itching and Other (See Comments)    Severe stomach pains, diarrhea  . Heparin     Likely HIT, SRA negative    Patient Measurements: Height: 5\' 2"  (157.5 cm) Weight: 72.2 kg (159 lb 2.8 oz) IBW/kg (Calculated) : 50.1  Vital Signs: Temp: 97.5 F (36.4 C) (04/24 1200) Temp Source: Axillary (04/24 1200) BP: 135/68 (04/24 1700) Pulse Rate: 101 (04/24 1700)  Labs: Recent Labs    03/02/20 0429 03/02/20 0429 03/02/20 0844 03/02/20 1549 03/03/20 0501 03/03/20 1821 03/04/20 0339 03/04/20 0339 03/04/20 1112 03/04/20 1409 03/04/20 1513  HGB 8.8*   < >  --   --  9.2*  --  8.6*  --   --   --   --   HCT 28.3*  --   --   --  29.2*  --  28.8*  --   --   --   --   PLT 45*   < > 36*  --  52*  --  73*  --   --   --   --   APTT 57*  --   --    < > 53*   < > 46*   < > 51* 97* 75*  CREATININE 0.89  --   --   --  0.94  --  0.80  --   --   --   --    < > = values in this interval not displayed.    Estimated Creatinine Clearance: 58.2 mL/min (by C-G formula based on SCr of 0.8 mg/dL).  Assessment: 74 year old initially admitted 4/4 with acute IVH and thrombocythemia with PLTC of 990k. Patient was ultimately diagnosed with polythemia, now s/p leukophoresis, therapeutic phlebotomy, and initiation of hydroxyurea. On 4/13 - found to have B/L PE - decision was risk of AC was too great and IVC placed. CT scan 4/15 showed extensive thrombus within confluence of sinuses and sent to IR with mechanical thrombectomy x5.  SRA resulted negative, Dr. Jana Hakim ordered repeat sent to different lab - still with working diagnosis of HIT d/t high pre-test probability. Also need to look into other causes for new TCP - though increased this am  APTT is now supratherapeutic at 75 sec(recheck from 97) - note that RN drawing labs through PICC line and  argatroban is running through PICC  line - RN pausing argatroban waiting 9min and then flushing and drawing labs - trying for consistent procedure to limit error. No bleeding noted, Hgb low stable 8-9s, platelets are up to 73.  Goal of Therapy:  aPTT 50 - 60 seconds Monitor platelets by anticoagulation protocol: Yes   Plan: Decrease argatroban at 0.35 mcg/kg/min Monitor aPTT (at 0500 and 1700), CBC and S/S of bleeding daily   Bonnita Nasuti Pharm.D. CPP, BCPS Clinical Pharmacist [phone] 03/04/2020 5:46 PM     **Pharmacist phone directory can be found on [email or web address] listed under [place]**

## 2020-03-04 NOTE — Progress Notes (Signed)
Hematology Lab review short note  . CBC Latest Ref Rng & Units 03/04/2020 03/03/2020 03/02/2020  WBC 4.0 - 10.5 K/uL 16.7(H) 19.3(H) -  Hemoglobin 12.0 - 15.0 g/dL 8.6(L) 9.2(L) -  Hematocrit 36.0 - 46.0 % 28.8(L) 29.2(L) -  Platelets 150 - 400 K/uL 73(L) 52(L) 36(L)   . CMP Latest Ref Rng & Units 03/04/2020 03/03/2020 03/03/2020  Glucose 70 - 99 mg/dL 191(H) - -  BUN 8 - 23 mg/dL 47(H) - -  Creatinine 0.44 - 1.00 mg/dL 0.80 - -  Sodium 135 - 145 mmol/L 153(H) 149(H) 152(H)  Potassium 3.5 - 5.1 mmol/L 4.5 - -  Chloride 98 - 111 mmol/L 123(H) - -  CO2 22 - 32 mmol/L 20(L) - -  Calcium 8.9 - 10.3 mg/dL 9.8 - -  Total Protein 6.5 - 8.1 g/dL - - -  Total Bilirubin 0.3 - 1.2 mg/dL - - -  Alkaline Phos 38 - 126 U/L - - -  AST 15 - 41 U/L - - -  ALT 0 - 44 U/L - - -    Complicated patient with Polycythemia Vera Jak2 +ve with bleeding and clotting issues. (excellent note by Dr Curlene Labrum noted) -platelet counts continue to improve --up to 73k -no change in hematology plan -hypernatremia ? Free water deficit vs central DI -- will defer mx to primary team. -I shall be available for hematology questions if any arise over the weekend. Dr Curlene Labrum will be back on Monday.  Sullivan Lone

## 2020-03-04 NOTE — Progress Notes (Signed)
[place] for argatroban Indication: HIT  Allergies  Allergen Reactions  . Erythromycin Itching and Other (See Comments)    Severe stomach pains, diarrhea  . Heparin     Likely HIT    Patient Measurements: Height: 5\' 2"  (157.5 cm) Weight: 72.2 kg (159 lb 2.8 oz) IBW/kg (Calculated) : 50.1  Vital Signs: Temp: 99 F (37.2 C) (04/24 0400) Temp Source: Oral (04/24 0400) BP: 140/74 (04/23 2000) Pulse Rate: 104 (04/23 2000)  Labs: Recent Labs    03/02/20 0429 03/02/20 0429 03/02/20 0844 03/02/20 1549 03/03/20 0501 03/03/20 1821 03/04/20 0339  HGB 8.8*   < >  --   --  9.2*  --  8.6*  HCT 28.3*  --   --   --  29.2*  --  28.8*  PLT 45*   < > 36*  --  52*  --  73*  APTT 57*  --   --    < > 53* 48* 46*  CREATININE 0.89  --   --   --  0.94  --  0.80   < > = values in this interval not displayed.    Estimated Creatinine Clearance: 58.2 mL/min (by C-G formula based on SCr of 0.8 mg/dL).  Assessment: 74 y.o. female with PE and sinus venous thromboses, HIT+, for argatroban   Goal of Therapy:  aPTT 50 - 60 seconds Monitor platelets by anticoagulation protocol: Yes   Plan: Increase argatroban 0.4 mcg/kg/min Recheck aPTT in 4 hours  Caryl Pina  03/04/2020 6:29 AM

## 2020-03-04 NOTE — Progress Notes (Addendum)
[place] for argatroban Indication: HIT  Allergies  Allergen Reactions  . Erythromycin Itching and Other (See Comments)    Severe stomach pains, diarrhea  . Heparin     Likely HIT    Patient Measurements: Height: 5\' 2"  (157.5 cm) Weight: 72.2 kg (159 lb 2.8 oz) IBW/kg (Calculated) : 50.1  Vital Signs: Temp: 97.7 F (36.5 C) (04/24 0800) Temp Source: Axillary (04/24 0800) BP: 137/71 (04/24 0800) Pulse Rate: 95 (04/24 0800)  Labs: Recent Labs    03/02/20 0429 03/02/20 0429 03/02/20 0844 03/02/20 1549 03/03/20 0501 03/03/20 0501 03/03/20 1821 03/04/20 0339 03/04/20 1112  HGB 8.8*   < >  --   --  9.2*  --   --  8.6*  --   HCT 28.3*  --   --   --  29.2*  --   --  28.8*  --   PLT 45*   < > 36*  --  52*  --   --  73*  --   APTT 57*  --   --    < > 53*   < > 48* 46* 51*  CREATININE 0.89  --   --   --  0.94  --   --  0.80  --    < > = values in this interval not displayed.    Estimated Creatinine Clearance: 58.2 mL/min (by C-G formula based on SCr of 0.8 mg/dL).  Assessment: 74 year old initially admitted 4/4 with acute IVH and thrombocythemia with PLTC of 990k. Patient was ultimately diagnosed with polythemia, now s/p leukophoresis, therapeutic phlebotomy, and initiation of hydroxyurea. On 4/13 - found to have B/L PE - decision was risk of AC was too great and IVC placed. CT scan 4/15 showed extensive thrombus within confluence of sinuses and sent to IR with mechanical thrombectomy x5.  SRA resulted negative, Dr. Jana Hakim ordered repeat sent to different lab - still with working diagnosis of HIT d/t high pre-test probability. Also need to look into other causes for new TCP - though increased this am  APTT is therapeutic at 51 sec. No bleeding noted, Hgb low stable 8-9s, platelets are up to 73.  Goal of Therapy:  aPTT 50 - 60 seconds Monitor platelets by anticoagulation protocol: Yes   Plan: Continue argatroban at 0.4  mcg/kg/min 2h aPTT Monitor aPTT (at 0500 and 1700), CBC and S/S of bleeding daily   Thank you for involving pharmacy in this patient's care.  Renold Genta, PharmD, BCPS Clinical Pharmacist Clinical phone for 03/04/2020 until 3p is [phone] 03/04/2020 12:27 PM  **Pharmacist phone directory can be found on [email or web address] listed under [place]**  Update: aPTT 51 > 97 sec on same rate. Spoke with RN and the infusion was held for ~1 min before drawing lab. We discussed repeating after holding infusion for ~5 min and will try to consistently do this since a narrow goal range has been requested.   Await redraw results, continue argatroban at 0.4 mcg/kg/min for now.  Renold Genta, PharmD, BCPS 3:09 PM

## 2020-03-04 NOTE — Progress Notes (Signed)
STROKE TEAM PROGRESS NOTE   INTERVAL HISTORY Husband and son at bedside. I answered multiple questions.   Patient still moaning, restless and grimacing during rounds.  Ativan as needed as ordered.  Sodium 154.  Platelet improved to 73.  Still has hyperglycemia due to steroids use, covered with SSI, NovoLog scheduled.  CT Pelvis and abdomen did not show anything to explain her agitation or anything that would cause significant pain.  Vitals:   03/04/20 0400 03/04/20 0500 03/04/20 0515 03/04/20 0600  BP: (!) 132/59 (!) 151/65 138/76 115/70  Pulse: 99   80  Resp: 18 20  16   Temp: 99 F (37.2 C)     TempSrc: Oral     SpO2: 95%   100%  Weight:  72.2 kg    Height:       CBC:  Recent Labs  Lab 03/03/20 0501 03/04/20 0339  WBC 19.3* 16.7*  NEUTROABS 16.7* 14.6*  HGB 9.2* 8.6*  HCT 29.2* 28.8*  MCV 100.7* 102.1*  PLT 52* 73*   Basic Metabolic Panel:  Recent Labs  Lab 02/29/20 0506 02/29/20 0926 02/29/20 1607 02/29/20 2035 03/03/20 0501 03/03/20 1317 03/03/20 1821 03/04/20 0339  NA 156*   < > 156*   < > 152*   < > 149* 153*  K 3.6  --   --    < > 4.6  --   --  4.5  CL >130*  --   --    < > 123*  --   --  123*  CO2 16*  --   --    < > 18*  --   --  20*  GLUCOSE 188*  --   --    < > 243*  --   --  191*  BUN 24*  --   --    < > 49*  --   --  47*  CREATININE 0.83  --   --    < > 0.94  --   --  0.80  CALCIUM 9.3  --   --    < > 10.0  --   --  9.8  MG 2.0  --  2.1  --   --   --   --   --   PHOS 2.4*  --  2.8  --   --   --   --   --    < > = values in this interval not displayed.   Lipid Panel:     Component Value Date/Time   CHOL 172 02/14/2020 0959   TRIG 318 (H) 03/03/2020 0501   HDL 71 02/14/2020 0959   CHOLHDL 2.4 02/14/2020 0959   VLDL 9 02/14/2020 0959   LDLCALC 92 02/14/2020 0959   HgbA1c:  Lab Results  Component Value Date   HGBA1C 6.1 (H) 02/14/2020    IMAGING past 24 hours  CT CHEST W CONTRAST 03/03/2020 IMPRESSION:  1. Recent pulmonary embolus with  development of pulmonary infarct in the right lower lobe.  2. Nodular opacities in the right lower lobe have otherwise improved, likely improving infection.  3. Generalized body wall edema, with mild retroperitoneal edema, nonspecific. No evidence of other acute finding in the abdomen or pelvis.  4. Colonic diverticulosis without diverticulitis.  5. Aortic atherosclerosis and coronary artery calcifications. Aortic Atherosclerosis (ICD10-I70.0)   Emphysema (ICD10-J43.9).   CT ABDOMEN PELVIS W CONTRAST 03/03/2020 IMPRESSION:  1. Recent pulmonary embolus with development of pulmonary infarct in the right lower lobe.  2. Nodular opacities in the right lower lobe have otherwise improved, likely improving infection.  3. Generalized body wall edema, with mild retroperitoneal edema, nonspecific. No evidence of other acute finding in the abdomen or pelvis.  4. Colonic diverticulosis without diverticulitis.  5. Aortic atherosclerosis and coronary artery calcifications. Aortic Atherosclerosis (ICD10-I70.0) and Emphysema (ICD10-J43.9).    PHYSICAL EXAM    General - Well nourished, well developed, restless and agitated with exam  Ophthalmologic - fundi not visualized due to noncooperation.  Cardiovascular - Regular rhythm and rate, not in afib.  Neuro - restless, agitated with exam, moving spontaneously all extremities, but drowsy without stimulation, not open eyes on voice or pain, moaning, not following commands. She resisted eye opening and difficult to exam pupils, facial symmetrical. BUE against gravity, symmetrical. BLE 2+/5 withdraw to pain, symmetrical. DTR 1+, no babinski. Sensation, coordination not cooperative and gait not tested.   ASSESSMENT/PLAN Ms. GLENNIS CLESTER is a 74 y.o. female with history of HTN presenting with severe persistent HA accompanied by nausea and vomiting. This has now improved.   IVH - right lateral ventricle IVH secondary to ? CAA vs. HTN Stroke: multiple  punctate B anterior and posterior circulation embolic infarcts in setting of PAF and Polycythemia Vera with clotting issues  CT head 4/5 0032 R lateral IVH. Severe chronic ischemic microangiopathy.  MRI 4/5 Stable IVH, severe leukoaraiosis. Multiple foci throughout progressed from previous MRI d/t microangiopathy vs amyloid.  MRA unremarkable   CT head repeat 4/5-4/10 - unchanged size and configuration of right IVH  CT Head 02/26/20 - Increased edema in the left greater than right temporal and occipital lobes which may reflect worsening edema from venous hypertension and venous infarcts.   CT head 02/28/20 decreased L>R edema. IVH same. Known SVT.    MRI 4/20 - left temporal and parietal significant petechial hemorrhage, likely due to significant CVST. Numerous punctate infarcts throughout brain. Numerous chronic microhemorrhages c/w CAA.  CT head 4/22 stable L temporal and occipital petechial hemorrhage. Stable IVH. Mild intracranial mass effect no midline shift. CSVT stable w/ edema.  CT Chest w contrast - 03/03/20 - Recent pulmonary embolus with development of pulmonary infarct in the right lower lobe. Nodular opacities in the right lower lobe have otherwise improved, likely improving infection.  CT Abdomen and Pelvis w contrast - 03/03/20 - Generalized body wall edema, with mild retroperitoneal edema, nonspecific. No evidence of other acute finding in the abdomen or pelvis.  Colonic diverticulosis without diverticulitis.   EEG - moderate diffuse encephalopathy  2D Echo EF 65-70%. No source of embolus   LDL 92   HgbA1c 6.1   DVT prophylaxis - argatroban IV   aspirin 81 mg daily prior to admission, now on argatroban due to HIT -:Plan AC for at least 6 mos. Switch to DOAC at d/c  Therapy recommendations:  CIR  Disposition:  pending  Pancytosis due to polycythemia vera   WBC 12->24.4->...->12.7->19.37->16.7 (temp - 99)  Hgb 17.1->18.6->...->8.8->9.2->8.6  PLT  990->...->45->36->52  -> 73  Bone marrow bx done, results no leukemia, consistent with polycythemia vera   Treated with hydrea and allopurinol, now off  S/p Phlebotomy and pheresis x 3  Erythropoietin level 1.3 (L)  JAK2 positive for V617F mutation  Re-eval 4/22 w/ PLT drop - felt to be medication related - seroquel and fiorecet stopped. Dr. Ron Agee reaching out to Dr. Gwenlyn Found to discuss stopping amiodarone   LDH 427 PLT ct c/w citrate adamts 13 activity pending HIT ab 2.553 -> 1.469  reticulocytes 76.4  Hematology on board   HIT syndrome  B/l PE cerebral venous sinus thrombosis s/p IR ? Drug induced thrombocytopenia  4/13 New SOB and tachycardia  CTA chest - BLE PE.   Filter placement on 4/13 as unable to get [facility] d/t IVH  4/15 CT head - straight sinus and vein of Galan CSVT  CTV Confirmed extensive thrombus within the confluence of sinuses and straight sinus, also extending into the vein of Galen. There is also fairly extensive multifocal thrombus within the right greater than left transverse and sigmoid dural venous sinuses.  S/p mechanical thrombectomy 02/24/20 of occluded deep cerebral veins  MRI 02/25/2020 shows multiple by cerebral and cerebellar infarcts as well as right temporal cortical venous infarct and cytotoxic edema  MRV persistent occlusion of 3 sinus, vein of Galan, right IJ, right basal vein of Rosenthal  CT head 4/17 increase edema in the left greater than right temporal and occipital lobes  CT head 4/19 decreased L>R edema. IVH same. Known SVT.  MRV 4/20 - dural venous sinus unchanged.   MRI 4/20 - left temporal and parietal significant petechial hemorrhage, likely due to significant CVST.   CT head 4/22 stable L temporal and occipital petechial hemorrhage. Stable IVH. Mild intracranial mass effect no midline shift. DVT stable w/ edema.  HIT antibody positive  On argatroban IV -> continue, may switch to DOAC later. AC at least 6 months  Decadron  10mg  x 1 -> 4mg  q6  Off 3% saline   Na Q 6 hrs  Na goal 150 - 155  Na 142->...->152->153-?152->149->153  Repeat HIT ab pending   D/c potential offending meds - amiodraone, oxycodone, fioricet, imodium, seroquel  PLT 990->...->45->36->52->73  Check CT chest, abd/pelvis to rue out any possible pathology, was negative  CT Chest w contrast - 03/03/20 - Recent pulmonary embolus with development of pulmonary infarct in the right lower lobe. Nodular opacities in the right lower lobe have otherwise improved, likely improving infection.  CT Abdomen and Pelvis w contrast - 03/03/20 - Generalized body wall edema, with mild retroperitoneal edema, nonspecific. No evidence of other acute finding in the abdomen or pelvis.  Colonic diverticulosis without diverticulitis.   Headache, due to IVH and CSVT  Depakote 500 mg nightly discontinued  Pt now has Fioricet one Q8 hrs prn as well as tylenol prn  Solumedrol 500 mg -> prednisone -> off  NS at 100 cc's / hr started 4/10 -> 3% saline  Add topamax 25 bid  Decadron started 10 mg IV x 1 then 4 mg IV Q 6 hrs (started 02/27/20)  CT head 4/17 increase edema in the left greater than right temporal and occipital lobes  CT head 4/19 decreased L>R edema. IVH same. Known SVT.  MRI and MRV4/20 dural venous sinus unchanged. left temporal and parietal significant petechial hemorrhage, likely due to significant CVST.   CT head 4/22  stable L temporal and occipital petechial hemorrhage. Stable IVH. Mild intracranial mass effect no midline shift. CSVT stable w/ edema.  Continue steroids until swelling decreasing  Afib RVR, new diagnosis  Rate in 160's -> back to sinus  Diltizem gtt discontinued  Amiodarone d/c'ed in concern of drug induced thrombocytopenia  TSH 5.311  Free T4 normal  OK with AC for 6 mos only due to acute illness and no recurrence of afib. Switch to DOAC once more stable  Cardiology signed off 02/26/20  Stopped amiodarone  4/22  Dysphagia  NPO status due to altered mental status  cortrak placed under fluoro 02/26/20 -> pt pulled off same day  cortrak replaced 4/19  On TF @ 60cc  SLP on board  Hypertension  Home meds:  losartan 100, amlodipine 5  On losartan 50 bid and amlodipine 10 and hydralazine 100 tid . SBP goal < 140  off Cleviprex now   BP Stable  . 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 Multiple foci throughout progressed from previous MRI d/t microangiopathy vs amyloid.  As per husband, pt has some anomia as baseline  BP goal < 140  Hyperlipidemia  Home meds:  lipitor 10  LDL 92  Statin held in setting of acute ICH  Consider continuation of statin at discharge  Hyperglycemia  On steroids  HgbA1c 6.1  CBGs q4h  SSI 0-15  Add novolog 2u q4h  Hypokalemia and Hypomagnesemia and Hypophosphatemia  Potassium 3.0->...->4.2->4.6->4.4  Magnesium 1.6->...->2.0->2.1  Phosphorus 2.3->...->2.4->2.8  Other Stroke Risk Factors  Advanced age  Former Cigarette smoker  Family hx stroke (mother, father, and multiple other family members)  Other Active Problems  Hepatitis C  Hypothyroid on synthroid. TSH WNL - resume synthroid. Free T4 normal  Urinary retention, foley placed  Diarrhea with TF - on imodium PRN - now off  Agitation. Seroquel stopped d/t lowering PLTs. No ativan over night.   Aortic Atherosclerosis (ICD10-I70.0)   Emphysema (ICD10-J43.9).   Hospital day # 19 This patient is critically ill and at significant risk of neurological worsening, death and care requires constant monitoring of vital signs, hemodynamics,respiratory and cardiac monitoring,review of multiple databases, neurological assessment, discussion with family, other specialists and medical decision making of  high complexity.I  I spent 60 minutes of neurocritical care time in the care of this patient.  Sarina Ill, MD [facility]     To contact Stroke Continuity provider, please refer to [email or web address] After hours, contact General Neurology

## 2020-03-04 NOTE — Progress Notes (Signed)
[place] for argatroban Indication: HIT  Allergies  Allergen Reactions  . Erythromycin Itching and Other (See Comments)    Severe stomach pains, diarrhea  . Heparin     Likely HIT, SRA negative    Patient Measurements: Height: 5\' 2"  (157.5 cm) Weight: 72.2 kg (159 lb 2.8 oz) IBW/kg (Calculated) : 50.1  Vital Signs: Temp: 98 F (36.7 C) (04/24 2000) Temp Source: Oral (04/24 2000) BP: 118/72 (04/24 2100) Pulse Rate: 85 (04/24 2100)  Labs: Recent Labs    03/02/20 0429 03/02/20 0429 03/02/20 0844 03/02/20 1549 03/03/20 0501 03/03/20 1821 03/04/20 0339 03/04/20 1112 03/04/20 1409 03/04/20 1513 03/04/20 1940  HGB 8.8*   < >  --   --  9.2*  --  8.6*  --   --   --   --   HCT 28.3*  --   --   --  29.2*  --  28.8*  --   --   --   --   PLT 45*   < > 36*  --  52*  --  73*  --   --   --   --   APTT 57*  --   --    < > 53*   < > 46*   < > 97* 75* 44*  CREATININE 0.89  --   --   --  0.94  --  0.80  --   --   --   --    < > = values in this interval not displayed.    Estimated Creatinine Clearance: 58.2 mL/min (by C-G formula based on SCr of 0.8 mg/dL).  Assessment: 74 y.o. female with PE and sinus venous thromboses, HIT+, for argatroban   Goal of Therapy:  aPTT 50 - 60 seconds Monitor platelets by anticoagulation protocol: Yes   Plan: Increase argatroban 0.4 mcg/kg/min Recheck aPTT in 4 hours  Caryl Pina  03/04/2020 11:23 PM

## 2020-03-05 DIAGNOSIS — I615 Nontraumatic intracerebral hemorrhage, intraventricular: Secondary | ICD-10-CM | POA: Diagnosis not present

## 2020-03-05 LAB — GLUCOSE, CAPILLARY
Glucose-Capillary: 185 mg/dL — ABNORMAL HIGH (ref 70–99)
Glucose-Capillary: 188 mg/dL — ABNORMAL HIGH (ref 70–99)
Glucose-Capillary: 189 mg/dL — ABNORMAL HIGH (ref 70–99)
Glucose-Capillary: 198 mg/dL — ABNORMAL HIGH (ref 70–99)
Glucose-Capillary: 206 mg/dL — ABNORMAL HIGH (ref 70–99)
Glucose-Capillary: 212 mg/dL — ABNORMAL HIGH (ref 70–99)

## 2020-03-05 LAB — BASIC METABOLIC PANEL
Anion gap: 8 (ref 5–15)
BUN: 38 mg/dL — ABNORMAL HIGH (ref 8–23)
CO2: 21 mmol/L — ABNORMAL LOW (ref 22–32)
Calcium: 9.4 mg/dL (ref 8.9–10.3)
Chloride: 123 mmol/L — ABNORMAL HIGH (ref 98–111)
Creatinine, Ser: 0.75 mg/dL (ref 0.44–1.00)
GFR calc Af Amer: >60 mL/min (ref >60)
GFR calc non Af Amer: >60 mL/min (ref >60)
Glucose, Bld: 224 mg/dL — ABNORMAL HIGH (ref 70–99)
Potassium: 4.2 mmol/L (ref 3.5–5.1)
Sodium: 152 mmol/L — ABNORMAL HIGH (ref 135–145)

## 2020-03-05 LAB — CBC WITH DIFFERENTIAL/PLATELET
Abs Immature Granulocytes: 0.43 10*3/uL — ABNORMAL HIGH (ref 0.00–0.07)
Basophils Absolute: 0 10*3/uL (ref 0.0–0.1)
Basophils Relative: 0 %
Eosinophils Absolute: 0 10*3/uL (ref 0.0–0.5)
Eosinophils Relative: 0 %
HCT: 27.9 % — ABNORMAL LOW (ref 36.0–46.0)
Hemoglobin: 8.3 g/dL — ABNORMAL LOW (ref 12.0–15.0)
Immature Granulocytes: 3 %
Lymphocytes Relative: 3 %
Lymphs Abs: 0.4 10*3/uL — ABNORMAL LOW (ref 0.7–4.0)
MCH: 30.2 pg (ref 26.0–34.0)
MCHC: 29.7 g/dL — ABNORMAL LOW (ref 30.0–36.0)
MCV: 101.5 fL — ABNORMAL HIGH (ref 80.0–100.0)
Monocytes Absolute: 1.1 10*3/uL — ABNORMAL HIGH (ref 0.1–1.0)
Monocytes Relative: 7 %
Neutro Abs: 12.9 10*3/uL — ABNORMAL HIGH (ref 1.7–7.7)
Neutrophils Relative %: 87 %
Platelets: 85 10*3/uL — ABNORMAL LOW (ref 150–400)
RBC: 2.75 MIL/uL — ABNORMAL LOW (ref 3.87–5.11)
RDW: 19.9 % — ABNORMAL HIGH (ref 11.5–15.5)
WBC: 14.9 10*3/uL — ABNORMAL HIGH (ref 4.0–10.5)
nRBC: 0 % (ref 0.0–0.2)

## 2020-03-05 LAB — SODIUM
Sodium: 150 mmol/L — ABNORMAL HIGH (ref 135–145)
Sodium: 150 mmol/L — ABNORMAL HIGH (ref 135–145)
Sodium: 152 mmol/L — ABNORMAL HIGH (ref 135–145)
Sodium: 152 mmol/L — ABNORMAL HIGH (ref 135–145)

## 2020-03-05 LAB — APTT
aPTT: 48 seconds — ABNORMAL HIGH (ref 24–36)
aPTT: 50 seconds — ABNORMAL HIGH (ref 24–36)
aPTT: 47 seconds — ABNORMAL HIGH (ref 24–36)
aPTT: 53 seconds — ABNORMAL HIGH (ref 24–36)

## 2020-03-05 MED ORDER — ARGATROBAN 50 MG/50ML IV SOLN
0.5700 ug/kg/min | INTRAVENOUS | Status: DC
  Administered 2020-03-05: 22:00:00 0.46 ug/kg/min via INTRAVENOUS
  Administered 2020-03-06 – 2020-03-07 (×2): 0.52 ug/kg/min via INTRAVENOUS
  Administered 2020-03-08: 07:00:00 0.57 ug/kg/min via INTRAVENOUS
  Filled 2020-03-05 (×6): qty 50

## 2020-03-05 NOTE — Progress Notes (Signed)
STROKE TEAM PROGRESS NOTE   INTERVAL HISTORY Husband and son not at bedside today, neuro stable, had ativan at 5am. I answered multiple questions yesterday.   Patient calmer but still moaning, restless and grimacing when trying to examine her, forfully shuts her eyes.  Ativan as needed as ordered.  Sodium 154.  Platelet improved to 73.  Still has hyperglycemia due to steroids use, covered with SSI, NovoLog scheduled.  CT Pelvis and abdomen did not show anything to explain her agitation or anything that would cause significant pain.  Vitals:   03/05/20 1100 03/05/20 1200 03/05/20 1300 03/05/20 1400  BP: (!) 144/70 132/62 118/79 114/63  Pulse: 96 93 97 97  Resp: 18 17 (!) 22 17  Temp:  98.7 F (37.1 C)    TempSrc:  Axillary    SpO2: 99% 98% 99% 98%  Weight:      Height:       CBC:  Recent Labs  Lab 03/04/20 0339 03/05/20 0357  WBC 16.7* 14.9*  NEUTROABS 14.6* 12.9*  HGB 8.6* 8.3*  HCT 28.8* 27.9*  MCV 102.1* 101.5*  PLT 73* 85*   Basic Metabolic Panel:  Recent Labs  Lab 02/29/20 0506 02/29/20 0926 02/29/20 1607 02/29/20 2035 03/04/20 0339 03/04/20 1112 03/05/20 0357 03/05/20 1215  NA 156*   < > 156*   < > 153*   < > 152*  152* 150*  K 3.6  --   --    < > 4.5  --  4.2  --   CL >130*  --   --    < > 123*  --  123*  --   CO2 16*  --   --    < > 20*  --  21*  --   GLUCOSE 188*  --   --    < > 191*  --  224*  --   BUN 24*  --   --    < > 47*  --  38*  --   CREATININE 0.83  --   --    < > 0.80  --  0.75  --   CALCIUM 9.3  --   --    < > 9.8  --  9.4  --   MG 2.0  --  2.1  --   --   --   --   --   PHOS 2.4*  --  2.8  --   --   --   --   --    < > = values in this interval not displayed.   Lipid Panel:     Component Value Date/Time   CHOL 172 02/14/2020 0959   TRIG 318 (H) 03/03/2020 0501   HDL 71 02/14/2020 0959   CHOLHDL 2.4 02/14/2020 0959   VLDL 9 02/14/2020 0959   LDLCALC 92 02/14/2020 0959   HgbA1c:  Lab Results  Component Value Date   HGBA1C 6.1 (H)  02/14/2020    IMAGING past 24 hours  CT CHEST W CONTRAST 03/03/2020 IMPRESSION:  1. Recent pulmonary embolus with development of pulmonary infarct in the right lower lobe.  2. Nodular opacities in the right lower lobe have otherwise improved, likely improving infection.  3. Generalized body wall edema, with mild retroperitoneal edema, nonspecific. No evidence of other acute finding in the abdomen or pelvis.  4. Colonic diverticulosis without diverticulitis.  5. Aortic atherosclerosis and coronary artery calcifications. Aortic Atherosclerosis (ICD10-I70.0)   Emphysema (ICD10-J43.9).   CT ABDOMEN PELVIS W  CONTRAST 03/03/2020 IMPRESSION:  1. Recent pulmonary embolus with development of pulmonary infarct in the right lower lobe.  2. Nodular opacities in the right lower lobe have otherwise improved, likely improving infection.  3. Generalized body wall edema, with mild retroperitoneal edema, nonspecific. No evidence of other acute finding in the abdomen or pelvis.  4. Colonic diverticulosis without diverticulitis.  5. Aortic atherosclerosis and coronary artery calcifications. Aortic Atherosclerosis (ICD10-I70.0) and Emphysema (ICD10-J43.9).    PHYSICAL EXAM    General - Well nourished, well developed, restless and agitated with exam  Ophthalmologic - fundi not visualized due to noncooperation.She resists eye opening.   Cardiovascular - Regular rhythm and rate, not in afib.  Neuro - restless, agitated with exam, moving spontaneously all extremities, but drowsy and calm without stimulation(had ativan several hours ago), not open eyes on voice or pain, moaning, not following commands. She resisted eye opening and difficult to exam pupils, facial symmetrical. BUE against gravity, increased tone x 4, symmetrical. BLE 2+/5 withdraw to pain, symmetrical. DTR 1+, no babinski. Sensation, coordination not cooperative and gait not tested.   ASSESSMENT/PLAN [name] is a 74 y.o. female  with history of HTN presenting with severe persistent HA accompanied by nausea and vomiting. This has now improved.   IVH - right lateral ventricle IVH secondary to ? CAA vs. HTN Stroke: multiple punctate B anterior and posterior circulation embolic infarcts in setting of PAF and Polycythemia Vera with clotting issues  CT head 4/5 0032 R lateral IVH. Severe chronic ischemic microangiopathy.  MRI 4/5 Stable IVH, severe leukoaraiosis. Multiple foci throughout progressed from previous MRI d/t microangiopathy vs amyloid.  MRA unremarkable   CT head repeat 4/5-4/10 - unchanged size and configuration of right IVH  CT Head 02/26/20 - Increased edema in the left greater than right temporal and occipital lobes which may reflect worsening edema from venous hypertension and venous infarcts.   CT head 02/28/20 decreased L>R edema. IVH same. Known SVT.    MRI 4/20 - left temporal and parietal significant petechial hemorrhage, likely due to significant CVST. Numerous punctate infarcts throughout brain. Numerous chronic microhemorrhages c/w CAA.  CT head 4/22 stable L temporal and occipital petechial hemorrhage. Stable IVH. Mild intracranial mass effect no midline shift. CSVT stable w/ edema.  CT Chest w contrast - 03/03/20 - Recent pulmonary embolus with development of pulmonary infarct in the right lower lobe. Nodular opacities in the right lower lobe have otherwise improved, likely improving infection.  CT Abdomen and Pelvis w contrast - 03/03/20 - Generalized body wall edema, with mild retroperitoneal edema, nonspecific. No evidence of other acute finding in the abdomen or pelvis.  Colonic diverticulosis without diverticulitis.   EEG - moderate diffuse encephalopathy  2D Echo EF 65-70%. No source of embolus   LDL 92   HgbA1c 6.1   DVT prophylaxis - argatroban IV   aspirin 81 mg daily prior to admission, now on argatroban due to HIT -:Plan AC for at least 6 mos. 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 Dr. Ron Agee reaching out to Dr. Gwenlyn Found to discuss stopping amiodarone   LDH 427 PLT ct c/w citrate adamts 13 activity pending HIT ab 2.553 -> 1.469 reticulocytes 76.4  Hematology on board   HIT syndrome  B/l PE cerebral venous sinus thrombosis s/p IR ? Drug induced thrombocytopenia  4/13 New SOB and tachycardia  CTA chest - BLE PE.   Filter placement on 4/13 as unable to get [facility] d/t IVH  4/15 CT head - straight sinus and vein of Galan CSVT  CTV Confirmed extensive thrombus within the confluence of sinuses and straight sinus, also extending into the vein of Galen. There is also fairly extensive multifocal thrombus within the right greater than left transverse and sigmoid dural venous sinuses.  S/p mechanical thrombectomy 02/24/20 of occluded deep cerebral veins  MRI 02/25/2020 shows multiple by cerebral and cerebellar infarcts as well as right temporal cortical venous infarct and cytotoxic edema  MRV persistent occlusion of 3 sinus, vein of Galan, right IJ, right basal vein of Rosenthal  CT head 4/17 increase edema in the left greater than right temporal and occipital lobes  CT head 4/19 decreased L>R edema. IVH same. Known SVT.  MRV 4/20 - dural venous sinus unchanged.   MRI 4/20 - left temporal and parietal significant petechial hemorrhage, likely due to significant CVST.   CT head 4/22 stable L temporal and occipital  petechial hemorrhage. Stable IVH. Mild intracranial mass effect no midline shift. DVT stable w/ edema.  HIT antibody positive  On argatroban IV -> continue, may switch to DOAC later. AC at least 6 months  Decadron 10mg  x 1 -> 4mg  q6  Off 3% saline   Na Q 6 hrs  Na goal 150 - 155  Na 142->...->152->153-152->149->153->152->150  Repeat HIT ab pending   D/c potential offending meds - amiodraone, oxycodone, fioricet, imodium, seroquel  PLT 990->...->45->36->52->73->85  Check CT chest, abd/pelvis to rue out any possible pathology, was negative  CT Chest w contrast - 03/03/20 - Recent pulmonary embolus with development of pulmonary infarct in the right lower lobe. Nodular opacities in the right lower lobe have otherwise improved, likely improving infection.  CT Abdomen and Pelvis w contrast - 03/03/20 - Generalized body wall edema, with mild retroperitoneal edema, nonspecific. No evidence of other acute finding in the abdomen or pelvis.  Colonic diverticulosis without diverticulitis.   Headache, due to IVH and CSVT  Depakote 500 mg nightly discontinued  Pt now has Fioricet one Q8 hrs prn as well as tylenol prn  Solumedrol 500 mg -> prednisone -> off  NS at 100 cc's / hr started 4/10 -> 3% saline  Add topamax 25 bid  Decadron started 10 mg IV x 1 then 4 mg IV Q 6 hrs (started 02/27/20)  CT head 4/17 increase edema in the left greater than right temporal and occipital lobes  CT head 4/19 decreased L>R edema. IVH same. Known SVT.  MRI and MRV4/20 dural venous sinus unchanged. left temporal and parietal significant petechial hemorrhage, likely due to significant CVST.   CT head 4/22  stable L temporal and occipital petechial hemorrhage. Stable IVH. Mild intracranial mass effect no midline shift. CSVT stable w/ edema.  Continue steroids until swelling decreasing  Afib RVR, new diagnosis  Rate in 160's -> back to sinus  Diltizem gtt discontinued  Amiodarone d/c'ed in concern  of drug induced thrombocytopenia  TSH 5.311  Free T4 normal  OK with AC for 6 mos only due to acute  illness and no recurrence of afib. Switch to DOAC once more stable  Cardiology signed off 02/26/20  Stopped amiodarone 4/22  Dysphagia  NPO status due to altered mental status  cortrak placed under fluoro 02/26/20 -> pt pulled off same day  cortrak replaced 4/19  On TF @ 60cc  SLP on board  Hypertension  Home meds:  losartan 100, amlodipine 5  On losartan 50 bid and amlodipine 10 and hydralazine 100 tid . SBP goal < 140  off Cleviprex now   BP Stable  . 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 Multiple foci throughout progressed from previous MRI d/t microangiopathy vs amyloid.  As per husband, pt has some anomia as baseline  BP goal < 140  Hyperlipidemia  Home meds:  lipitor 10  LDL 92  Statin held in setting of acute ICH  Consider continuation of statin at discharge  Hyperglycemia  On steroids  HgbA1c 6.1  CBGs q4h  SSI 0-15  Add novolog 2u q4h  Hypokalemia and Hypomagnesemia and Hypophosphatemia  Potassium 3.0->...->4.2->4.6->4.4->4.2  Magnesium 1.6->...->2.0->2.1  Phosphorus 2.3->...->2.4->2.8  Other Stroke Risk Factors  Advanced age  Former Cigarette smoker  Family hx stroke (mother, father, and multiple other family members)  Other Active Problems  Hepatitis C  Hypothyroid on synthroid. TSH WNL - resume synthroid. Free T4 normal  Urinary retention, foley placed  Diarrhea with TF - on imodium PRN - now off  Agitation. Seroquel stopped d/t lowering PLTs. No ativan over night.   Aortic Atherosclerosis (ICD10-I70.0)   Emphysema (ICD10-J43.9).   Hospital day # 20 This patient is critically ill and at significant risk of neurological worsening, death  and care requires constant monitoring of vital signs, hemodynamics,respiratory and cardiac monitoring,review of multiple databases, neurological assessment, discussion with family, other specialists and medical decision making of high complexity.I  I spent 35 minutes of neurocritical care time in the care of this patient.  Personally examined patient and images, and have participated in and made any corrections needed to history, physical, neuro exam,assessment and plan as stated above.  I have personally obtained the history, evaluated lab date, reviewed imaging studies and agree with radiology interpretations.    Sarina Ill, MD Stroke Neurology  .       To contact Stroke Continuity provider, please refer to [email or web address] After hours, contact General Neurology

## 2020-03-05 NOTE — Progress Notes (Addendum)
[place] for argatroban Indication: HIT  Allergies  Allergen Reactions  . Erythromycin Itching and Other (See Comments)    Severe stomach pains, diarrhea  . Heparin     Likely HIT, SRA negative    Patient Measurements: Height: 5\' 2"  (157.5 cm) Weight: 78.4 kg (172 lb 13.5 oz) IBW/kg (Calculated) : 50.1  Vital Signs: Temp: 97.4 F (36.3 C) (04/25 0400) Temp Source: Axillary (04/25 0400) BP: 120/68 (04/25 0600) Pulse Rate: 95 (04/25 0600)  Labs: Recent Labs    03/03/20 0501 03/03/20 1821 03/04/20 0339 03/04/20 1112 03/04/20 1513 03/04/20 1940 03/05/20 0357  HGB 9.2*  --  8.6*  --   --   --  8.3*  HCT 29.2*  --  28.8*  --   --   --  27.9*  PLT 52*  --  73*  --   --   --  85*  APTT 53*   < > 46*   < > 75* 44* 48*  CREATININE 0.94  --  0.80  --   --   --  0.75   < > = values in this interval not displayed.    Estimated Creatinine Clearance: 60.7 mL/min (by C-G formula based on SCr of 0.75 mg/dL).  Assessment: 74 year old initially admitted 4/4 with acute IVH and thrombocythemia with PLTC of 990k. Patient was ultimately diagnosed with polythemia, now s/p leukophoresis, therapeutic phlebotomy, and initiation of hydroxyurea. On 4/13 - found to have B/L PE - decision was risk of AC was too great and IVC placed. CT scan 4/15 showed extensive thrombus within confluence of sinuses and sent to IR with mechanical thrombectomy x5.  SRA resulted negative, Dr. Jana Hakim ordered repeat sent to different lab - still with working diagnosis of HIT d/t high pre-test probability. Also need to look into other causes for new TCP - though increased this am  4/25: aPTT is subtherapeutic at 48 sec; it was supratherapeutic last night. I suspect we will be chasing aPTTs because the argatroban is infusing through the central line, being paused for variable amount of time, flushed, and then the aPTT is drawn. I spoke with the RN this morning and we agreed it  would be best to place a PIV for argatroban to infuse or have phlebotomy do peripheral sticks.   Goal of Therapy:  aPTT 50 - 60 seconds Monitor platelets by anticoagulation protocol: Yes   Plan: Continue argatroban at 0.4 mcg/kg/min until decision made for PIV or peripheral sticks Will f/u later this morning  Thank you for involving pharmacy in this patient's care.  Renold Genta, PharmD, BCPS Clinical Pharmacist Clinical phone for 03/05/2020 until 3p is [phone] 03/05/2020 7:49 AM  **Pharmacist phone directory can be found on [email or web address] listed under [place]**  Update: PIV placed and argatroban changed to this line. 2h aPTT is therapeutic at 50 sec, no bleeding noted, Hgb 8.3, platelets up to 85.  Continue argatroban drip at 0.4 mcg/kg/min Confirmatory aPTT in 2h  [facility], PharmD, BCPS 11:12 AM   Update: aPTT is just below goal at 47 sec. No bleeding noted.   Increase argatroban conservatively by 15% to 0.46 mcg/kg/min 2h aPTT  Renold Genta, PharmD, BCPS 1:12 PM

## 2020-03-05 NOTE — Progress Notes (Signed)
[place] for argatroban Indication: HIT  Allergies  Allergen Reactions  . Erythromycin Itching and Other (See Comments)    Severe stomach pains, diarrhea  . Heparin     Likely HIT, SRA negative    Patient Measurements: Height: 5\' 2"  (157.5 cm) Weight: 78.4 kg (172 lb 13.5 oz) IBW/kg (Calculated) : 50.1  Vital Signs: Temp: 98.2 F (36.8 C) (04/25 1600) Temp Source: Axillary (04/25 1600) BP: 148/72 (04/25 1800) Pulse Rate: 100 (04/25 1800)  Labs: Recent Labs    03/03/20 0501 03/03/20 1821 03/04/20 0339 03/04/20 1112 03/05/20 0357 03/05/20 0357 03/05/20 1022 03/05/20 1215 03/05/20 1743  HGB 9.2*  --  8.6*  --  8.3*  --   --   --   --   HCT 29.2*  --  28.8*  --  27.9*  --   --   --   --   PLT 52*  --  73*  --  85*  --   --   --   --   APTT 53*   < > 46*   < > 48*   < > 50* 47* 53*  CREATININE 0.94  --  0.80  --  0.75  --   --   --   --    < > = values in this interval not displayed.    Estimated Creatinine Clearance: 60.7 mL/min (by C-G formula based on SCr of 0.75 mg/dL).  Assessment: 74 year old initially admitted 4/4 with acute IVH and thrombocythemia with PLTC of 990k. Patient was ultimately diagnosed with polythemia, now s/p leukophoresis, therapeutic phlebotomy, and initiation of hydroxyurea. On 4/13 - found to have B/L PE - decision was risk of AC was too great and IVC placed. CT scan 4/15 showed extensive thrombus within confluence of sinuses and sent to IR with mechanical thrombectomy x5.  SRA resulted negative, Dr. Jana Hakim ordered repeat sent to different lab - still with working diagnosis of HIT d/t high pre-test probability. Also need to look into other causes for new TCP - though increased this am  4/25: aPTT is 53sec at goal on argatroban 0.58mcg/kg/min RN this morning placed a PIV for argatroban to infuse and labs drawn via CL.   Goal of Therapy:  aPTT 50 - 60 seconds Monitor platelets by anticoagulation  protocol: Yes   Plan: Continue argatroban at 0.46 mcg/kg/min  Aptt q12, daily cbc  Bonnita Nasuti Pharm.D. CPP, BCPS Clinical Pharmacist [phone] 03/05/2020 6:43 PM    **Pharmacist phone directory can be found on [email or web address] listed under [place]**

## 2020-03-06 ENCOUNTER — Inpatient Hospital Stay (HOSPITAL_COMMUNITY): Payer: Medicare HMO

## 2020-03-06 DIAGNOSIS — I615 Nontraumatic intracerebral hemorrhage, intraventricular: Secondary | ICD-10-CM | POA: Diagnosis not present

## 2020-03-06 LAB — CBC WITH DIFFERENTIAL/PLATELET
Abs Immature Granulocytes: 0.47 10*3/uL — ABNORMAL HIGH (ref 0.00–0.07)
Basophils Absolute: 0 10*3/uL (ref 0.0–0.1)
Basophils Relative: 0 %
Eosinophils Absolute: 0 10*3/uL (ref 0.0–0.5)
Eosinophils Relative: 0 %
HCT: 26.7 % — ABNORMAL LOW (ref 36.0–46.0)
Hemoglobin: 8 g/dL — ABNORMAL LOW (ref 12.0–15.0)
Immature Granulocytes: 4 %
Lymphocytes Relative: 4 %
Lymphs Abs: 0.4 10*3/uL — ABNORMAL LOW (ref 0.7–4.0)
MCH: 30.4 pg (ref 26.0–34.0)
MCHC: 30 g/dL (ref 30.0–36.0)
MCV: 101.5 fL — ABNORMAL HIGH (ref 80.0–100.0)
Monocytes Absolute: 0.9 10*3/uL (ref 0.1–1.0)
Monocytes Relative: 7 %
Neutro Abs: 10.4 10*3/uL — ABNORMAL HIGH (ref 1.7–7.7)
Neutrophils Relative %: 85 %
Platelets: 108 10*3/uL — ABNORMAL LOW (ref 150–400)
RBC: 2.63 MIL/uL — ABNORMAL LOW (ref 3.87–5.11)
RDW: 19.6 % — ABNORMAL HIGH (ref 11.5–15.5)
WBC: 12.2 10*3/uL — ABNORMAL HIGH (ref 4.0–10.5)
nRBC: 0 % (ref 0.0–0.2)

## 2020-03-06 LAB — URINALYSIS, ROUTINE W REFLEX MICROSCOPIC
Bilirubin Urine: NEGATIVE
Glucose, UA: NEGATIVE mg/dL
Hgb urine dipstick: NEGATIVE
Ketones, ur: NEGATIVE mg/dL
Nitrite: NEGATIVE
Protein, ur: NEGATIVE mg/dL
Specific Gravity, Urine: 1.016 (ref 1.005–1.030)
pH: 7 (ref 5.0–8.0)

## 2020-03-06 LAB — GLUCOSE, CAPILLARY
Glucose-Capillary: 221 mg/dL — ABNORMAL HIGH (ref 70–99)
Glucose-Capillary: 188 mg/dL — ABNORMAL HIGH (ref 70–99)
Glucose-Capillary: 161 mg/dL — ABNORMAL HIGH (ref 70–99)
Glucose-Capillary: 155 mg/dL — ABNORMAL HIGH (ref 70–99)
Glucose-Capillary: 99 mg/dL (ref 70–99)

## 2020-03-06 LAB — BASIC METABOLIC PANEL
Anion gap: 7 (ref 5–15)
BUN: 31 mg/dL — ABNORMAL HIGH (ref 8–23)
CO2: 23 mmol/L (ref 22–32)
Calcium: 9.1 mg/dL (ref 8.9–10.3)
Chloride: 117 mmol/L — ABNORMAL HIGH (ref 98–111)
Creatinine, Ser: 0.67 mg/dL (ref 0.44–1.00)
GFR calc Af Amer: >60 mL/min (ref >60)
GFR calc non Af Amer: >60 mL/min (ref >60)
Glucose, Bld: 207 mg/dL — ABNORMAL HIGH (ref 70–99)
Potassium: 3.8 mmol/L (ref 3.5–5.1)
Sodium: 147 mmol/L — ABNORMAL HIGH (ref 135–145)

## 2020-03-06 LAB — APTT
aPTT: 46 seconds — ABNORMAL HIGH (ref 24–36)
aPTT: 51 seconds — ABNORMAL HIGH (ref 24–36)
aPTT: 52 seconds — ABNORMAL HIGH (ref 24–36)
aPTT: 49 seconds — ABNORMAL HIGH (ref 24–36)
aPTT: 49 seconds — ABNORMAL HIGH (ref 24–36)

## 2020-03-06 LAB — SODIUM: Sodium: 148 mmol/L — ABNORMAL HIGH (ref 135–145)

## 2020-03-06 LAB — TRIGLYCERIDES: Triglycerides: 137 mg/dL (ref <150)

## 2020-03-06 MED ORDER — INSULIN ASPART 100 UNIT/ML ~~LOC~~ SOLN
0.0000 [IU] | SUBCUTANEOUS | Status: DC
  Administered 2020-03-06 (×2): 4 [IU] via SUBCUTANEOUS
  Administered 2020-03-07 (×2): 3 [IU] via SUBCUTANEOUS
  Administered 2020-03-07 – 2020-03-08 (×3): 4 [IU] via SUBCUTANEOUS
  Administered 2020-03-08 (×2): 3 [IU] via SUBCUTANEOUS
  Administered 2020-03-09: 7 [IU] via SUBCUTANEOUS
  Administered 2020-03-09: 4 [IU] via SUBCUTANEOUS
  Administered 2020-03-09: 21:00:00 3 [IU] via SUBCUTANEOUS
  Administered 2020-03-10 (×3): 4 [IU] via SUBCUTANEOUS

## 2020-03-06 MED ORDER — LABETALOL HCL 5 MG/ML IV SOLN: 10.0000 mg | INTRAVENOUS | Status: DC | PRN

## 2020-03-06 MED ORDER — VALPROIC ACID 250 MG/5ML PO SOLN
250.0000 mg | Freq: Two times a day (BID) | ORAL | Status: DC
  Administered 2020-03-06 – 2020-03-10 (×9): 250 mg
  Filled 2020-03-06 (×9): qty 5

## 2020-03-06 MED ORDER — TOPIRAMATE 25 MG PO TABS
50.0000 mg | ORAL_TABLET | Freq: Two times a day (BID) | ORAL | Status: DC
  Administered 2020-03-06: 50 mg

## 2020-03-06 MED ORDER — LORAZEPAM 2 MG/ML IJ SOLN
0.5000 mg | Freq: Once | INTRAMUSCULAR | Status: AC
  Administered 2020-03-06: 22:00:00 0.5 mg via INTRAVENOUS
  Filled 2020-03-06: qty 1

## 2020-03-06 MED ORDER — DEXAMETHASONE SODIUM PHOSPHATE 4 MG/ML IJ SOLN
4.0000 mg | Freq: Three times a day (TID) | INTRAMUSCULAR | Status: DC
  Administered 2020-03-06 – 2020-03-07 (×3): 4 mg via INTRAVENOUS
  Filled 2020-03-06 (×3): qty 1

## 2020-03-06 MED ORDER — AMANTADINE HCL 50 MG/5ML PO SYRP
100.0000 mg | ORAL_SOLUTION | Freq: Two times a day (BID) | ORAL | Status: DC
  Administered 2020-03-06 – 2020-03-10 (×9): 100 mg
  Filled 2020-03-06 (×11): qty 10

## 2020-03-06 MED ORDER — INSULIN ASPART 100 UNIT/ML ~~LOC~~ SOLN
5.0000 [IU] | SUBCUTANEOUS | Status: DC
  Administered 2020-03-06 – 2020-03-10 (×15): 5 [IU] via SUBCUTANEOUS

## 2020-03-06 MED ORDER — GLUCERNA 1.2 CAL PO LIQD
1000.0000 mL | ORAL | Status: DC
  Administered 2020-03-06 – 2020-03-08 (×3): 1000 mL
  Filled 2020-03-06 (×9): qty 1000

## 2020-03-06 NOTE — Progress Notes (Signed)
SLP Cancellation Note  Patient Details Name: [name] MRN: [id number] DOB: Apr 26, 1946   Cancelled treatment:       Reason Eval/Treat Not Completed: Fatigue/lethargy limiting ability to participate. Per RN she is not alert enough for swallow tx this morning but MD has made adjustments to her medications today that may help. Will f/u as alertness improves.     Osie Bond., M.A. [facility] Pager [phone] Office [phone]  03/06/2020, 10:54 AM

## 2020-03-06 NOTE — Progress Notes (Addendum)
[place] for argatroban Indication: HIT  Allergies  Allergen Reactions  . Erythromycin Itching and Other (See Comments)    Severe stomach pains, diarrhea  . Heparin     Likely HIT, SRA negative    Patient Measurements: Height: 5\' 2"  (157.5 cm) Weight: 78.4 kg (172 lb 13.5 oz) IBW/kg (Calculated) : 50.1  Vital Signs: Temp: 98.3 F (36.8 C) (04/26 0400) Temp Source: Axillary (04/26 0400) BP: 120/77 (04/26 0530) Pulse Rate: 80 (04/26 0530)  Labs: Recent Labs    03/04/20 0339 03/04/20 1112 03/05/20 0357 03/05/20 1022 03/05/20 1215 03/05/20 1743 03/06/20 0538  HGB 8.6*  --  8.3*  --   --   --  8.0*  HCT 28.8*  --  27.9*  --   --   --  26.7*  PLT 73*  --  85*  --   --   --  108*  APTT 46*   < > 48*   < > 47* 53* 46*  CREATININE 0.80  --  0.75  --   --   --   --    < > = values in this interval not displayed.    Estimated Creatinine Clearance: 60.7 mL/min (by C-G formula based on SCr of 0.75 mg/dL).  Assessment: 74 year old initially admitted 4/4 with acute IVH and thrombocythemia with PLTC of 990k. Patient was ultimately diagnosed with polythemia, now s/p leukophoresis, therapeutic phlebotomy, and initiation of hydroxyurea. On 4/13 - found to have B/L PE - decision was risk of AC was too great and IVC placed. CT scan 4/15 showed extensive thrombus within confluence of sinuses and sent to IR with mechanical thrombectomy x5.  SRA resulted negative, Dr. Jana Hakim ordered repeat sent to different lab - still with working diagnosis of HIT d/t high pre-test probability. Also need to look into other causes for new TCP - though platelets now improving.   aPTT this morning of 46 is subtherapeutic on agatroban 0.16mcg/kg/min (goal 50-60 seconds). Hgb 8.0. Platelet 108 - improving. No reported bleeding. Per RN no issues with IV infusion or access. Argatroban infusing in PIV and aPTT drawn from central line appropriately.   Goal of Therapy:   aPTT 50 - 60 seconds Monitor platelets by anticoagulation protocol: Yes   Plan: Increase argatroban to 0.52 mcg/kg/min (using previously weight of 69.9kg) Check aPTT at [place], PharmD PGY1 Pharmacy Resident [employer]: [phone]  03/06/2020 6:19 AM    Addendum: aPTT 51 on argatroban 0.64mcg/kg/min is therapeutic. No issues with IV infusion or bleeding noted.   Plan: - Continue argatroban 0.15mcg/kg/min - Check aPTT at 1200   Second Addendum: Confirmatory aPTT 52 on argatroban 0.52 mcg/kg/min continues to be therapeutic.   Plan: - Continue argatroban 0.31mcg/kg/min - Check aPTT at 1700 and continue with 0500/1700 aPTT regimen

## 2020-03-06 NOTE — Progress Notes (Signed)
STROKE TEAM PROGRESS NOTE   INTERVAL HISTORY Patient remains sleepy and lethargic but can be aroused and speaks few words which is difficult to understand but is able to move all 4 extremities against gravity purposefully.  Blood pressure adequately controlled.  Serum sodium is 148 today.  White count is 12.2 hemoglobin 8 and hematocrit 26.7.  Platelet counts have come up to 108,000.  CBGs remain high.  She remains on argatroban infusion monitored by pharmacy.  Her husband is at the bedside. Repeat CT scan of the head today shows stable appearance of left temporoparietal occipital and intraventricular hemorrhage as well as cerebral edema with decreased conspicuityy of the hyperdensity in the vein of Galen and straight sinus. She continues to be anemic with no obvious cause found.  Discussed with Dr. Jana Hakim hematologist who feels this may be due to marrow suppression.  She is still on dexamethasone 4 mg every 6 hourly and sugars remain high. Vitals:   03/06/20 0730 03/06/20 0800 03/06/20 0830 03/06/20 0900  BP: (!) 115/55 (!) 152/108 (!) 108/59 (!) 106/58  Pulse: 93 86 77 73  Resp: 20 16 17 15   Temp:  (!) 97.5 F (36.4 C)    TempSrc:  Axillary    SpO2: 100% 100% 99% 98%  Weight:      Height:       CBC:  Recent Labs  Lab 03/05/20 0357 03/06/20 0538  WBC 14.9* 12.2*  NEUTROABS 12.9* 10.4*  HGB 8.3* 8.0*  HCT 27.9* 26.7*  MCV 101.5* 101.5*  PLT 85* 123XX123*   Basic Metabolic Panel:  Recent Labs  Lab 02/29/20 0506 02/29/20 0926 02/29/20 1607 02/29/20 2035 03/04/20 0339 03/04/20 1112 03/05/20 0357 03/05/20 1215 03/05/20 1743 03/06/20 0115  NA 156*   < > 156*   < > 153*   < > 152*  152*   < > 150* 148*  K 3.6  --   --    < > 4.5  --  4.2  --   --   --   CL >130*  --   --    < > 123*  --  123*  --   --   --   CO2 16*  --   --    < > 20*  --  21*  --   --   --   GLUCOSE 188*  --   --    < > 191*  --  224*  --   --   --   BUN 24*  --   --    < > 47*  --  38*  --   --   --    CREATININE 0.83  --   --    < > 0.80  --  0.75  --   --   --   CALCIUM 9.3  --   --    < > 9.8  --  9.4  --   --   --   MG 2.0  --  2.1  --   --   --   --   --   --   --   PHOS 2.4*  --  2.8  --   --   --   --   --   --   --    < > = values in this interval not displayed.    IMAGING past 24 hours No results found.    PHYSICAL EXAM    General - Well nourished,  well developed, restless and agitated with exam  Ophthalmologic - fundi not visualized due to noncooperation.She resists eye opening.   Cardiovascular - Regular rhythm and rate, not in afib.  Neuro - restless, agitated with exam, moving spontaneously all extremities, but drowsy and calm without stimulation(had ativan several hours ago), not open eyes on voice or pain, moaning, not following commands. She resisted eye opening and difficult to exam pupils, facial symmetrical. BUE against gravity, increased tone x 4, symmetrical. BLE 2+/5 withdraw to pain, symmetrical. DTR 1+, no babinski. Sensation, coordination not cooperative and gait not tested.   ASSESSMENT/PLAN [name] is a 74 y.o. female with history of HTN presenting with severe persistent HA accompanied by nausea and vomiting. This has now improved.   IVH - right lateral ventricle IVH secondary to ? CAA vs. HTN Stroke: multiple punctate B anterior and posterior circulation embolic infarcts in setting of PAF and Polycythemia Vera with clotting issues  CT head 4/5 0032 R lateral IVH. Severe chronic ischemic microangiopathy.  MRI 4/5 Stable IVH, severe leukoaraiosis. Multiple foci throughout progressed from previous MRI d/t microangiopathy vs amyloid.  MRA unremarkable   CT head repeat 4/5-4/10 - unchanged size and configuration of right IVH  CT Head 02/26/20 - Increased edema in the left greater than right temporal and occipital lobes which may reflect worsening edema from venous hypertension and venous infarcts.   CT head 02/28/20 decreased L>R edema. IVH  same. Known SVT.    MRI 4/20 - left temporal and parietal significant petechial hemorrhage, likely due to significant CVST. Numerous punctate infarcts throughout brain. Numerous chronic microhemorrhages c/w CAA.  CT head 4/22 stable L temporal and occipital petechial hemorrhage. Stable IVH. Mild intracranial mass effect no midline shift. CSVT stable w/ edema.  CT Chest w contrast - 03/03/20 - Recent pulmonary embolus with development of pulmonary infarct in the right lower lobe. Nodular opacities in the right lower lobe have otherwise improved, likely improving infection.  CT Abdomen and Pelvis w contrast - 03/03/20 - Generalized body wall edema, with mild retroperitoneal edema, nonspecific. No evidence of other acute finding in the abdomen or pelvis.  Colonic diverticulosis without diverticulitis.   EEG - moderate diffuse encephalopathy  2D Echo EF 65-70%. No source of embolus   LDL 92   HgbA1c 6.1   DVT prophylaxis - argatroban IV   aspirin 81 mg daily prior to admission, now on argatroban due to HIT -:Plan AC for at least 6 mos. 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 Dr. Ron Agee reaching out to Dr. Gwenlyn Found to discuss stopping amiodarone   LDH 427 PLT ct c/w citrate adamts 13 activity pending HIT ab 2.553 -> 1.469 reticulocytes 76.4  Hematology on board   HIT syndrome  B/l PE cerebral venous sinus thrombosis s/p IR ? Drug induced thrombocytopenia  4/13 New SOB and tachycardia  CTA chest -  BLE PE.   Filter placement on 4/13 as unable to get [facility] d/t IVH  4/15 CT head - straight sinus and vein of Galan CSVT  CTV Confirmed extensive thrombus within the confluence of sinuses and straight sinus, also extending into the vein of Galen. There is also fairly extensive multifocal thrombus within the right greater than left transverse and sigmoid dural venous sinuses.  S/p mechanical thrombectomy 02/24/20 of occluded deep cerebral veins  MRI 02/25/2020 shows multiple by cerebral and cerebellar infarcts as well as right temporal cortical venous infarct and cytotoxic edema  MRV persistent occlusion of 3 sinus, vein of Galan, right IJ, right basal vein of Rosenthal  CT head 4/17 increase edema in the left greater than right temporal and occipital lobes  CT head 4/19 decreased L>R edema. IVH same. Known SVT.  MRV 4/20 - dural venous sinus unchanged.   MRI 4/20 - left temporal and parietal significant petechial hemorrhage, likely due to significant CVST.   CT head 4/22 stable L temporal and occipital petechial hemorrhage. Stable IVH. Mild intracranial mass effect no midline shift. DVT stable w/ edema.  HIT antibody positive  On argatroban IV -> continue, may switch to DOAC later. AC at least 6 months  Decadron 10mg  x 1 -> 4mg  q6  Off 3% saline   Na Q 6 hrs -?d/c  Na goal 150 - 155  Na 150     Repeat HIT ab pending   D/c potential offending meds - amiodraone, oxycodone, fioricet, imodium, seroquel  PLT 990->...->85     Check CT chest, abd/pelvis to rue out any possible pathology, was negative  CT Chest w contrast - 03/03/20 - Recent pulmonary embolus with development of pulmonary infarct in the right lower lobe. Nodular opacities in the right lower lobe have otherwise improved, likely improving infection.  CT Abdomen and Pelvis w contrast - 03/03/20 - Generalized body wall edema, with mild retroperitoneal edema, nonspecific. No evidence of other acute finding in the abdomen  or pelvis.  Colonic diverticulosis without diverticulitis.   Q6h Na checks -changed to daily now  Headache, due to IVH and CSVT  Depakote 500 mg nightly discontinued  Pt now has Fioricet one Q8 hrs prn as well as tylenol prn  Solumedrol 500 mg -> prednisone -> off  NS at 100 cc's / hr started 4/10 -> 3% saline  Add topamax 25 bid  Decadron started 10 mg IV x 1 then 4 mg IV Q 6 hrs (started 02/27/20)  CT head 4/17 increase edema in the left greater than right temporal and occipital lobes  CT head 4/19 decreased L>R edema. IVH same. Known SVT.  MRI and MRV4/20 dural venous sinus unchanged. left temporal and parietal significant petechial hemorrhage, likely due to significant CVST.   CT head 4/22  stable L temporal and occipital petechial hemorrhage. Stable IVH. Mild intracranial mass effect no midline shift. CSVT stable w/ edema.  Continue steroids until swelling decreasing  Afib RVR, new diagnosis  Rate in 160's -> back to sinus  Diltizem gtt discontinued  Amiodarone d/c'ed in concern of drug induced thrombocytopenia  TSH 5.311  Free T4 normal  OK with AC for 6 mos only due to acute illness and no recurrence of afib. Switch to DOAC once more stable  Cardiology signed off 02/26/20  Stopped amiodarone 4/22  Dysphagia  NPO status due to altered mental status  cortrak placed under fluoro 02/26/20 -> pt pulled off same day  cortrak replaced 4/19  On TF @ 60cc  SLP on board  Hypertension  Home meds:  losartan 100, amlodipine 5  On losartan 50 bid and amlodipine 10 and hydralazine 100 tid . SBP goal < 140  off Cleviprex now   BP Stable  . 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 Multiple foci throughout progressed from previous MRI d/t microangiopathy vs amyloid.  As per husband, pt  has some anomia as baseline  BP goal < 140  Hyperlipidemia  Home meds:  lipitor 10  LDL 92  Statin held in setting of acute ICH  Consider continuation of statin at discharge  Hyperglycemia  On steroids - begin taper today x 3 days and stop   HgbA1c 6.1  CBGs q4h  SSI 0-15 -? Change to resistenc  increase novolog 2u q4h->    Hypokalemia and Hypomagnesemia and Hypophosphatemia  Potassium 4.2     Magnesium 2.1     Phosphorus 2.8    Other Stroke Risk Factors  Advanced age  Former Cigarette smoker  Family hx stroke (mother, father, and multiple other family members)  Sedation  Stop all sedating meds - ativan, topamax. recently stopped Seroquel     Add amantadine 100 mg twice daily to help her be more alert     Add depakene 250 milligram 3 times daily to help with agitation as well as headaches  Other Active Problems  Hepatitis C  Hypothyroid on synthroid. TSH WNL - resume synthroid. Free T4 normal  Urinary retention, foley placed  Diarrhea with TF - on imodium PRN - now off  Agitation. Seroquel stopped d/t lowering PLTs.   Aortic Atherosclerosis (ICD10-I70.0)   Emphysema (ICD10-J43.9).   Anemia - ? Related to steroids-we will plan to taper and discontinue over the next 3 days Check CT head today If CT improved, taper steroids   Reduce to q8  Hold sedating medications.  Start amantadine for arousal.  Continue tube feeds. Transfer to stepdown unit when bed becomes available. Discussed with Dr. Jana Hakim.  Continue argatroban for now and when able to swallow switched to Eliquis. Long discussion with patient's husband at the bedside and discussed plan of care and answered questions. This patient is critically ill and at significant risk of neurological worsening, death and care requires constant monitoring of vital signs, hemodynamics,respiratory and cardiac monitoring, extensive review of multiple databases, frequent neurological assessment, discussion  with family, other specialists and medical decision making of high complexity.I have made any additions or clarifications directly to the above note.This critical care time does not reflect procedure time, or teaching time or supervisory time of PA/NP/Med Resident etc but could involve care discussion time.  I spent 30 minutes of neurocritical care time  in the care of  this patient.     Hospital day # 21    Victoria Contras, MD  To contact Stroke Continuity provider, please refer to [email or web address] After hours, contact General Neurology

## 2020-03-06 NOTE — Progress Notes (Signed)
Dr Leonie Man brought up the question whether Ms Pro's clotting problems could be related to her earlier [employer] vaccine. I do not find reports of clotting problems from this vaccine. There are clotting problems very rarely associated with the J&J and AstraZeneca vaccines. Interestingly, the clotting complicatons mimic HIT, as described below   (From: Nature: 25 February 2020)  "The clots that have been tentatively linked to the AstraZeneca and J&J vaccines have particular characteristics: they occur in unusual parts of the body, such as the brain or abdomen, and are coupled with low levels of platelets, cell fragments that aid blood coagulation. Further analysis found other hallmarks of a condition called heparin-induced thrombocytopaenia (HIT), a rare side effect sometimes seen in people who have taken the anti-coagulant heparin1,2,3 -- even though the vaccine recipients had not taken that drug.  HIT is thought to be triggered when heparin binds to a protein called platelet factor 4. This kicks off an immune response -- including the production of antibodies against platelet factor 4 -- that ultimately results in platelet destruction and the release of clot-promoting material. The mystery is what serves as the trigger for this syndrome in the absence of heparin.  The vaccines produced by AstraZeneca and J&J both rely on adenoviruses, which carry the DNA encoding a coronavirus protein called spike into human cells. The cells' protein machinery then uses the DNA to make the spike protein, and the body develops an immune response against it.  At present, researchers don't know what component of these vaccines could be causing the unwanted immune response against platelet factor 4. "It could be caused by the vectors, it could be caused by the spike protein, it could be caused by a contaminant present in the vector," says viral immunologist Hildegund Ertl at the [employer] in [place],  [place]."   In short, we know Covid-19 infection can cause clotting problems secondary to endothelial damage. And we know that the J&J and AstraZeneca adenovirus vaccines can very rarely cause a HIT-like syndrome. However we do not have evidence that Ms Coolet had the Covid-19 infection or that the [place] vaccine causes a similar syndrome.  I would continue to stay away from heparin and use argatroban until the patient is able to take orals reliably, at which point we can switch to a DOAC.  Will continue to follow with you

## 2020-03-06 NOTE — Progress Notes (Signed)
Inpatient Rehabilitation Admissions Coordinator  I continue to follow her progress. Not yet a candidate for CIR.  Danne Baxter, RN, MSN Rehab Admissions Coordinator [phone] 03/06/2020 4:21 PM

## 2020-03-06 NOTE — Progress Notes (Signed)
Physical Therapy Treatment Patient Details Name: [name] MRN: [id number] DOB: Dec 15, 1945 Today's Date: 03/06/2020    History of Present Illness 74 yo female presented with persistent HA, nausea and vomiting pt with stable R lateral ventricle IVH. Pt with acute bil PE 4/13, IVC filter placed 4/14. 4/15 head CT demonstrated deep cerebral thrombosis s/p IR for thrombectomy with expressive aphasia after. 4/16 MRI showed multiple cerebral and cerebellar infarcts, Rt temporal infarct with cytotoxic edema and hypertonic saline started.  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 PMHx: HTN, HLD, hep C, hypothyroidism    PT Comments    Pt opened her eyes to the sound of her son's voice today and was able to sustain attention very breifly to the highly familiar task of washing her face and hand over hand assist combing her hair.  She needed frequent re arousal with sternal rubs or upper trap squeezes and followed one command.  She is speaking in one word answers or several word phrases.  We listened to some music that she likes Ophelia Shoulder and Dorthy Cooler) and she hummed along.  She continues to spontaneously move all 4 extremities and is often quite restless in the bed.  She remains appropriate for intensive post acute rehab and I have increased her frequency to reflect her increased participation and arousal today.  PT will continue to follow acutely for safe mobility progression.  Follow Up Recommendations  CIR     Equipment Recommendations  Wheelchair (measurements PT);Wheelchair cushion (measurements PT);3in1 (PT);Hospital bed    Recommendations for Other Services Rehab consult     Precautions / Restrictions Precautions Precautions: Fall Restrictions Weight Bearing Restrictions: No    Mobility  Bed Mobility Overal bed mobility: Needs Assistance Bed Mobility: Rolling Rolling: Total assist;+2 for physical assistance         General bed mobility comments: Pt was total  assist to roll bil for bed change mostly due to pt pushing/resisting movement to her side and pushing in the opposite direction of the roll that PT and RN were attempting.  After bed was changed, pt positioned in max chair mode with pillows and wedges to help prevent right lateral lean.  I high chair mode we worked on pulling forward with back unsupported, purposeful stimulation to help get her aroused with eyes open, oral care, hand over hand face washing and combing of her hair and interaciton with her son and husband as well as using some of her favorite music for stimulation (she hummed along).                                            Balance Overall balance assessment: Needs assistance Sitting-balance support: Feet unsupported;Bilateral upper extremity supported Sitting balance-Leahy Scale: Poor Sitting balance - Comments: mod to max assist for trunk unsupported on bed.  Postural control: Right lateral lean                                  Cognition Arousal/Alertness: Lethargic(more alert than I have seen her ) Behavior During Therapy: Restless Overall Cognitive Status: Impaired/Different from baseline Area of Impairment: Orientation;Attention;Memory;Following commands;Safety/judgement;Awareness;Problem solving                 Orientation Level: Disoriented to;Person;Place;Time;Situation Current Attention Level: Sustained Memory: Decreased short-term memory Following Commands:  Follows one step commands inconsistently Safety/Judgement: Decreased awareness of safety;Decreased awareness of deficits Awareness: Intellectual Problem Solving: Decreased initiation;Difficulty sequencing;Requires verbal cues;Requires tactile cues General Comments: Does some automatic tasks (washing her face, scratching an itch attempting to comb her hair), but only followed one of many asked commands during the session.  Responded exceptionally well to family this PM  opening her eyes fully and interacting with them for breif periods of time (a few mins).  needed re arousal frequently with upper trap pinch.               Pertinent Vitals/Pain Pain Assessment: Faces Faces Pain Scale: Hurts little more Pain Location: headache Pain Descriptors / Indicators: Grimacing Pain Intervention(s): Limited activity within patient's tolerance;Monitored during session;Repositioned           PT Goals (current goals can now be found in the care plan section) Acute Rehab PT Goals Patient Stated Goal: Pt speaking more today one word and short phrase responses.  Progress towards PT goals: Progressing toward goals    Frequency    Min 4X/week      PT Plan Current plan remains appropriate;Frequency needs to be updated    Co-evaluation              AM-PAC PT "6 Clicks" Mobility   Outcome Measure  Help needed turning from your back to your side while in a flat bed without using bedrails?: Total Help needed moving from lying on your back to sitting on the side of a flat bed without using bedrails?: Total Help needed moving to and from a bed to a chair (including a wheelchair)?: A Lot Help needed standing up from a chair using your arms (e.g., wheelchair or bedside chair)?: Total Help needed to walk in hospital room?: Total Help needed climbing 3-5 steps with a railing? : Total 6 Click Score: 7    End of Session   Activity Tolerance: Patient limited by lethargy Patient left: in chair;in bed;Other (comment);with family/visitor present;with restraints reapplied(bed in chair mode)   PT Visit Diagnosis: Other abnormalities of gait and mobility (R26.89);Pain;Other symptoms and signs involving the nervous system (R29.898);Difficulty in walking, not elsewhere classified (R26.2)     Time: 1600-1700 PT Time Calculation (min) (ACUTE ONLY): 60 min  Charges:  $Therapeutic Activity: 38-52 mins $Neuromuscular Re-education: 8-22 mins                     Verdene Lennert, PT, DPT  Acute Rehabilitation [phone] pager [phone]) [phone] office

## 2020-03-06 NOTE — Progress Notes (Signed)
TIAH HECKEL   DOB:Oct 23, 1946   KG#:[id number]   GUY#:[id number]  Subjective:  Kalise did not respond to my voice or touch during my visit today.  No family at the bedside.  Spoke with nursing who states this is how she has been most of their shift.  They indicate that she has not really been able to take much p.o.  Objective: white woman examined in bed, restraints in place Vitals:   03/06/20 1330 03/06/20 1400  BP: (!) 102/58 (!) 113/55  Pulse: 88 88  Resp: 16 16  Temp:    SpO2: 99% 99%    Body mass index is 31.61 kg/m.  Intake/Output Summary (Last 24 hours) at 03/06/2020 1450 Last data filed at 03/06/2020 1400 Gross per 24 hour  Intake 1675.13 ml  Output 1650 ml  Net 25.13 ml     CBG (last 3)  Recent Labs    03/06/20 0331 03/06/20 0801 03/06/20 1134  GLUCAP 221* 188* 161*     Labs:  Lab Results  Component Value Date   WBC 12.2 (H) 03/06/2020   HGB 8.0 (L) 03/06/2020   HCT 26.7 (L) 03/06/2020   MCV 101.5 (H) 03/06/2020   PLT 108 (L) 03/06/2020   NEUTROABS 10.4 (H) 03/06/2020    _0 @  Urine Studies No results for input(s): UHGB, CRYS in the last 72 hours.  Invalid input(s): UACOL, UAPR, USPG, UPH, UTP, UGL, UKET, UBIL, UNIT, UROB, ULEU, UEPI, UWBC, URBC, UBAC, CAST, [place], [place]  Basic Metabolic Panel: Recent Labs  Lab 02/28/20 1550 02/28/20 2127 02/29/20 0506 02/29/20 0926 02/29/20 1607 02/29/20 2035 03/02/20 0429 03/02/20 0844 03/03/20 0501 03/03/20 1317 03/04/20 0339 03/04/20 1112 03/05/20 0357 03/05/20 1215 03/05/20 1743 03/06/20 0115 03/06/20 1000  NA 149*   < > 156*   < > 156*   < > 152*   < > 152*   < > 153*   < > 152*  152* 150* 150* 148* 147*  K  --   --  3.6  --   --    < > 4.2  --  4.6  --  4.5  --  4.2  --   --   --  3.8  CL  --   --  >130*  --   --    < > 125*  --  123*  --  123*  --  123*  --   --   --  117*  CO2  --   --  16*  --   --    < > 20*  --  18*  --  20*  --  21*  --   --   --  23  GLUCOSE  --   --  188*   --   --    < > 229*  --  243*  --  191*  --  224*  --   --   --  207*  BUN  --   --  24*  --   --    < > 38*  --  49*  --  47*  --  38*  --   --   --  31*  CREATININE  --   --  0.83  --   --    < > 0.89  --  0.94  --  0.80  --  0.75  --   --   --  0.67  CALCIUM  --   --  9.3  --   --    < >  9.8  --  10.0  --  9.8  --  9.4  --   --   --  9.1  MG 2.1  --  2.0  --  2.1  --   --   --   --   --   --   --   --   --   --   --   --   PHOS 2.4*  --  2.4*  --  2.8  --   --   --   --   --   --   --   --   --   --   --   --    < > = values in this interval not displayed.   GFR Estimated Creatinine Clearance: 60.7 mL/min (by C-G formula based on SCr of 0.67 mg/dL). Liver Function Tests: Recent Labs  Lab 02/29/20 0506  AST 26  ALT 46*  ALKPHOS 97  BILITOT 0.5  PROT 5.4*  ALBUMIN 2.9*   No results for input(s): LIPASE, AMYLASE in the last 168 hours. No results for input(s): AMMONIA in the last 168 hours. Coagulation profile No results for input(s): INR, PROTIME in the last 168 hours.  CBC: Recent Labs  Lab 03/02/20 0429 03/02/20 0429 03/02/20 0844 03/03/20 0501 03/04/20 0339 03/05/20 0357 03/06/20 0538  WBC 12.7*  --   --  19.3* 16.7* 14.9* 12.2*  NEUTROABS  --   --   --  16.7* 14.6* 12.9* 10.4*  HGB 8.8*  --   --  9.2* 8.6* 8.3* 8.0*  HCT 28.3*  --   --  29.2* 28.8* 27.9* 26.7*  MCV 100.0  --   --  100.7* 102.1* 101.5* 101.5*  PLT 45*   < > 36* 52* 73* 85* 108*   < > = values in this interval not displayed.   Cardiac Enzymes: No results for input(s): CKTOTAL, CKMB, CKMBINDEX, TROPONINI in the last 168 hours. BNP: Invalid input(s): POCBNP CBG: Recent Labs  Lab 03/05/20 1953 03/05/20 2340 03/06/20 0331 03/06/20 0801 03/06/20 1134  GLUCAP 198* 212* 221* 188* 161*   D-Dimer No results for input(s): DDIMER in the last 72 hours. Hgb A1c No results for input(s): HGBA1C in the last 72 hours. Lipid Profile Recent Labs    03/06/20 0115  TRIG 137   Thyroid function  studies No results for input(s): TSH, T4TOTAL, T3FREE, THYROIDAB in the last 72 hours.  Invalid input(s): FREET3 Anemia work up No results for input(s): VITAMINB12, FOLATE, FERRITIN, TIBC, IRON, RETICCTPCT in the last 72 hours. Microbiology No results found for this or any previous visit (from the past 240 hour(s)).    Studies:  CT HEAD WO CONTRAST  Result Date: 03/06/2020 CLINICAL DATA:  Stroke follow-up. EXAM: CT HEAD WITHOUT CONTRAST TECHNIQUE: Contiguous axial images were obtained from the base of the skull through the vertex without intravenous contrast. COMPARISON:  Head CT March 02, 2020 FINDINGS: Brain: Redemonstrated prominent confluent hypodensity of the white matter of the cerebral hemispheres, unchanged. Hypodensity in the bilateral thalami also appear stable. There is also no significant change of the gyriform confluent hemorrhage in the left lateral temporo occipital region as well as intraventricular hemorrhage in the atria and occipital horns, right greater than left. No new focus of hemorrhage identified. No hydrocephalus, the large arterial territorial infarct, no mass lesion. Vascular: There is interval decrease in conspicuity of the hyperdensity within the vein of Galen and straight sinus. Calcified plaques in the bilateral carotid siphons. Skull:  Normal. Negative for fracture or focal lesion. Sinuses/Orbits: No acute finding. Other: Nasoenteric tube again noted. IMPRESSION: 1. No significant change of the gyriform confluent hemorrhage in the left lateral temporo occipital region as well as intraventricular hemorrhage in the atria and occipital horns, right greater than left. No new focus of hemorrhage identified. 2. Redemonstrated prominent confluent hypodensity of the white matter of the cerebral hemispheres and bilateral thalami, stable 3. Interval decrease in conspicuity of the hyperdensity within the vein of Galen and straight sinus. Electronically Signed   By: Pedro Earls M.D.   On: 03/06/2020 11:50    Assessment: 74 y.o. [place] woman presenting 02/13/2020 with headache, found to have biventricular bleeds in the setting of panmyelosis, subsequently with pulmonary emboli and sinus venous thromboses in the setting of heparin induced thrombocytopenia  (1) polycythemia vera  (a) JAK2 mutation positive confirming diagnosis  (b) bone marrow biopsy 02/13/2020 shows no evidence of leukemia, c/w P Vera  (c)  leukapheresis x3 . Last leukapheresis Friday 02/18/2020   (i) HD catheter removed after pheresis on  02/18/2020  (d) hydrea as needed to keep HCT <45 and platelets in normal range   (2) heparin induced thrombocytopenia: positive HIT screen  (a) severe thrombocythemia noted 02/24/2020  (b) bilateral pulmonary emboli 02/22/2020   (i) s/p IVC filter placement 02/22/2020   (ii) atrial fibrillation with RVR  (c) venous sinus thromboses 02/24/2020   (i) s/p mechanical thrombectomy  (d) argatroban started 02/25/2020  (e) platelet count responded to heparin withdrawal  (f) confirmatory serotonin release assay negative (see discussion below)   (i) continue argatroban for anticoagulation, no heparin   (ii) assay being repeated in a different lab  (3) drug-induced thrombocytopenia (see discussion below)  (a) REVIEW OF BLOOD FILM 04/22/2021shows no schistocytes, no red cell abnormalities; leukocytosis with a minimal left shift; no platelet clumps -- this makes DIC and TTP very unlikely  (b) patient is receiving high-dose steroids, which makes ITP unlikely  (c) amiodarone discontinued 03/02/2020  Plan:  Platelets continue to improve.  Unclear if this is related to discontinuation of amiodarone versus discontinuation of heparin flushes.  She remains on argatroban.  No bleeding has been noted.  Recommend switching the patient from argatroban to [place] once her condition stabilizes.  She is currently not able to take p.o. medications very well.  Her  oral medications are currently being given through her cortrack.  Additionally, we were asked to comment about whether the patient is earlier [place] COVID-19 vaccine could have precipitated her clotting problems.  Please see note from 4/26//21 at 10:19 AM for a detailed discussion of this.  I will continue to follow with you  [name], NP 03/06/2020  2:50 PM Medical Oncology and Hematology [facility] [address] [place], [place] [address] Tel. [phone]    Fax. [phone]

## 2020-03-06 NOTE — Progress Notes (Signed)
[place] for argatroban Indication: HIT  Allergies  Allergen Reactions  . Erythromycin Itching and Other (See Comments)    Severe stomach pains, diarrhea  . Heparin     Likely HIT, SRA negative    Patient Measurements: Height: 5\' 2"  (157.5 cm) Weight: 78.4 kg (172 lb 13.5 oz) IBW/kg (Calculated) : 50.1  Vital Signs: Temp: 97.5 F (36.4 C) (04/26 1749) Temp Source: Oral (04/26 1749) BP: 126/70 (04/26 1749) Pulse Rate: 100 (04/26 1749)  Labs: Recent Labs    03/04/20 0339 03/04/20 1112 03/05/20 0357 03/05/20 1022 03/06/20 0538 03/06/20 0538 03/06/20 1000 03/06/20 1254 03/06/20 1745  HGB 8.6*  --  8.3*  --  8.0*  --   --   --   --   HCT 28.8*  --  27.9*  --  26.7*  --   --   --   --   PLT 73*  --  85*  --  108*  --   --   --   --   APTT 46*   < > 48*   < > 46*   < > 51* 52* 49*  CREATININE 0.80  --  0.75  --   --   --  0.67  --   --    < > = values in this interval not displayed.    Estimated Creatinine Clearance: 60.7 mL/min (by C-G formula based on SCr of 0.67 mg/dL).  Assessment: 74 year old initially admitted 4/4 with acute IVH and thrombocythemia with PLTC of 990k. Patient was ultimately diagnosed with polythemia, now s/p leukophoresis, therapeutic phlebotomy, and initiation of hydroxyurea. On 4/13 - found to have B/L PE - decision was risk of AC was too great and IVC placed. CT scan 4/15 showed extensive thrombus within confluence of sinuses and sent to IR with mechanical thrombectomy x5.  SRA resulted negative, Dr. Jana Hakim ordered repeat sent to different lab - still with working diagnosis of HIT d/t high pre-test probability. Also need to look into other causes for new TCP - though platelets now improving.   aPTT this evening is just below goal at 49 sec on agatroban 0.52 mcg/kg/min (goal 50-60 seconds). No reported bleeding. Per RN no issues with IV infusion. RN informed me the argatroban was moved back to the central  line and then the aPTT was drawn; the PIV was removed. RN is going to try to get another PIV to avoid the infusion and levels being drawn from the same line as this can introduce error in levels. Since IV line was the only variable that changed and 2 previous aPTT were therapeutic, will keep argatroban at current rate.  Goal of Therapy:  aPTT 50 - 60 seconds Monitor platelets by anticoagulation protocol: Yes   Plan: Continue argatroban at 0.52 mcg/kg/min (using previously weight of 69.9kg) F/U this evening on line status  Thank you for involving pharmacy in this patient's care.  Renold Genta, PharmD, BCPS Clinical Pharmacist Clinical phone for 03/06/2020 until 10p is [phone] 03/06/2020 6:37 PM  **Pharmacist phone directory can be found on [email or web address] listed under [place]**

## 2020-03-07 DIAGNOSIS — I615 Nontraumatic intracerebral hemorrhage, intraventricular: Secondary | ICD-10-CM | POA: Diagnosis not present

## 2020-03-07 LAB — BASIC METABOLIC PANEL
Anion gap: 7 (ref 5–15)
BUN: 31 mg/dL — ABNORMAL HIGH (ref 8–23)
CO2: 25 mmol/L (ref 22–32)
Calcium: 9.4 mg/dL (ref 8.9–10.3)
Chloride: 114 mmol/L — ABNORMAL HIGH (ref 98–111)
Creatinine, Ser: 0.85 mg/dL (ref 0.44–1.00)
GFR calc Af Amer: >60 mL/min (ref >60)
GFR calc non Af Amer: >60 mL/min (ref >60)
Glucose, Bld: 107 mg/dL — ABNORMAL HIGH (ref 70–99)
Potassium: 4.1 mmol/L (ref 3.5–5.1)
Sodium: 146 mmol/L — ABNORMAL HIGH (ref 135–145)

## 2020-03-07 LAB — APTT
aPTT: 50 seconds — ABNORMAL HIGH (ref 24–36)
aPTT: 47 seconds — ABNORMAL HIGH (ref 24–36)

## 2020-03-07 LAB — CBC WITH DIFFERENTIAL/PLATELET
Abs Immature Granulocytes: 0.56 10*3/uL — ABNORMAL HIGH (ref 0.00–0.07)
Basophils Absolute: 0 10*3/uL (ref 0.0–0.1)
Basophils Relative: 0 %
Eosinophils Absolute: 0.1 10*3/uL (ref 0.0–0.5)
Eosinophils Relative: 1 %
HCT: 28.5 % — ABNORMAL LOW (ref 36.0–46.0)
Hemoglobin: 8.6 g/dL — ABNORMAL LOW (ref 12.0–15.0)
Immature Granulocytes: 4 %
Lymphocytes Relative: 7 %
Lymphs Abs: 0.9 10*3/uL (ref 0.7–4.0)
MCH: 30.7 pg (ref 26.0–34.0)
MCHC: 30.2 g/dL (ref 30.0–36.0)
MCV: 101.8 fL — ABNORMAL HIGH (ref 80.0–100.0)
Monocytes Absolute: 1.2 10*3/uL — ABNORMAL HIGH (ref 0.1–1.0)
Monocytes Relative: 9 %
Neutro Abs: 10.5 10*3/uL — ABNORMAL HIGH (ref 1.7–7.7)
Neutrophils Relative %: 79 %
Platelets: 149 10*3/uL — ABNORMAL LOW (ref 150–400)
RBC: 2.8 MIL/uL — ABNORMAL LOW (ref 3.87–5.11)
RDW: 19.9 % — ABNORMAL HIGH (ref 11.5–15.5)
WBC: 13.3 10*3/uL — ABNORMAL HIGH (ref 4.0–10.5)
nRBC: 0 % (ref 0.0–0.2)

## 2020-03-07 LAB — GLUCOSE, CAPILLARY
Glucose-Capillary: 168 mg/dL — ABNORMAL HIGH (ref 70–99)
Glucose-Capillary: 139 mg/dL — ABNORMAL HIGH (ref 70–99)
Glucose-Capillary: 79 mg/dL (ref 70–99)
Glucose-Capillary: 148 mg/dL — ABNORMAL HIGH (ref 70–99)
Glucose-Capillary: 129 mg/dL — ABNORMAL HIGH (ref 70–99)
Glucose-Capillary: 120 mg/dL — ABNORMAL HIGH (ref 70–99)
Glucose-Capillary: 136 mg/dL — ABNORMAL HIGH (ref 70–99)

## 2020-03-07 MED ORDER — GERHARDT'S BUTT CREAM
TOPICAL_CREAM | CUTANEOUS | Status: DC | PRN
  Filled 2020-03-07 (×2): qty 1

## 2020-03-07 MED ORDER — DEXAMETHASONE SODIUM PHOSPHATE 4 MG/ML IJ SOLN
4.0000 mg | Freq: Two times a day (BID) | INTRAMUSCULAR | Status: DC
  Administered 2020-03-07 – 2020-03-10 (×6): 4 mg via INTRAVENOUS
  Filled 2020-03-07 (×6): qty 1

## 2020-03-07 NOTE — Progress Notes (Addendum)
STROKE TEAM PROGRESS NOTE   INTERVAL HISTORY Patient appears improved today.  She is sitting up in bed.  She is alert and interactive.  She is able to speak short sentences but is confused and disoriented and suspect aphasic.  She follows simple midline and one-step commands and moves all 4 extremities against gravity.  She is needing mitts in both hands to restrain her.  Vital signs are stable.  Platelet counts have come up.  And hematocrit is stable.  Her husband is at the bedside.  Patient was switched to neuro stepdown unit yesterday. Vitals:   03/07/20 0346 03/07/20 0500 03/07/20 0727 03/07/20 1117  BP:   138/80 (!) 106/57  Pulse:   94 95  Resp:   16 16  Temp: (!) 97.5 F (36.4 C)  (!) 97.4 F (36.3 C) 98 F (36.7 C)  TempSrc: Oral  Oral Axillary  SpO2:   100% 100%  Weight:  76.9 kg    Height:       CBC:  Recent Labs  Lab 03/06/20 0538 03/07/20 0608  WBC 12.2* 13.3*  NEUTROABS 10.4* 10.5*  HGB 8.0* 8.6*  HCT 26.7* 28.5*  MCV 101.5* 101.8*  PLT 108* 123456*   Basic Metabolic Panel:  Recent Labs  Lab 02/29/20 1607 02/29/20 2035 03/06/20 1000 03/07/20 0608  NA 156*   < > 147* 146*  K  --    < > 3.8 4.1  CL  --    < > 117* 114*  CO2  --    < > 23 25  GLUCOSE  --    < > 207* 107*  BUN  --    < > 31* 31*  CREATININE  --    < > 0.67 0.85  CALCIUM  --    < > 9.1 9.4  MG 2.1  --   --   --   PHOS 2.8  --   --   --    < > = values in this interval not displayed.    IMAGING past 24 hours No results found.   PHYSICAL EXAM      General - Well nourished, well developed elderly Caucasian lady, restless and agitated with exam  Ophthalmologic - fundi not visualized due to noncooperation.She resists eye opening.   Cardiovascular - Regular rhythm and rate, not in afib.  Neuro -patient is awake and alert today.  She has mild dysarthria but can be clearly understood and speaks short sentences.  She has some word finding difficulties and nonfluent speech.  She can follow only  simple midline and one-step commands.  She has diminished attention registration and recall.  She is disoriented.  She cannot name her husband.  She blinks to threat more on the left than on the right.  No facial weakness.  Tongue midline.  She moves all 4 extremities well against gravity without focal weakness.  . Sensation, coordination not cooperative and gait not tested.   ASSESSMENT/PLAN Ms. PRINCES SABBATH is a 74 y.o. female with history of HTN presenting with severe persistent HA accompanied by nausea and vomiting. This has now improved.    1.  Stroke: multiple punctate B anterior and posterior circulation embolic infarcts in setting of PAF and Polycythemia Vera with clotting issues 2.  IVH - right lateral ventricle IVH secondary to ? CAA vs. HTN  CT head 4/5 0032 R lateral IVH. Severe chronic ischemic microangiopathy.  MRI 4/5 Stable IVH, severe leukoaraiosis. Multiple foci throughout progressed from previous MRI  d/t microangiopathy vs amyloid.  MRA unremarkable   CT head repeat 4/5-4/10 - unchanged size and configuration of right IVH  CT Head 02/26/20 - Increased edema in the left greater than right temporal and occipital lobes which may reflect worsening edema from venous hypertension and venous infarcts.   CT head 02/28/20 decreased L>R edema. IVH same. Known SVT.    MRI 4/20 - left temporal and parietal significant petechial hemorrhage, likely due to significant CVST. Numerous punctate infarcts throughout brain. Numerous chronic microhemorrhages c/w CAA.  CT head 4/22 stable L temporal and occipital petechial hemorrhage. Stable IVH. Mild intracranial mass effect no midline shift. CSVT stable w/ edema.  CT Chest w contrast - 03/03/20 - Recent pulmonary embolus with development of pulmonary infarct in the right lower lobe. Nodular opacities in the right lower lobe have otherwise improved, likely improving infection.  CT Abdomen and Pelvis w contrast - 03/03/20 - Generalized body  wall edema, with mild retroperitoneal edema, nonspecific. No evidence of other acute finding in the abdomen or pelvis.  Colonic diverticulosis without diverticulitis.   EEG - moderate diffuse encephalopathy  2D Echo EF 65-70%. No source of embolus   LDL 92   HgbA1c 6.1   DVT prophylaxis - argatroban IV   aspirin 81 mg daily prior to admission, now on argatroban due to HIT -:Plan AC for at least 6 mos. 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 Dr. Ron Agee reaching out to Dr. Gwenlyn Found to discuss stopping amiodarone   LDH 427 PLT ct c/w citrate adamts 13 activity pending HIT ab 2.553 -> 1.469 reticulocytes 76.4  Hematology on board   HIT syndrome  B/l PE cerebral venous sinus thrombosis s/p IR ? Drug induced thrombocytopenia  4/13 New SOB and tachycardia  CTA chest - BLE PE.   Filter placement on 4/13 as unable to get [facility] d/t IVH  4/15 CT head - straight sinus and vein of Galan CSVT  CTV Confirmed extensive thrombus within the confluence of sinuses and straight sinus, also extending into the vein of Galen. There is also fairly extensive multifocal thrombus within the right greater than left transverse and sigmoid dural venous sinuses.  S/p mechanical thrombectomy 02/24/20 of occluded deep cerebral veins  MRI 02/25/2020 shows multiple by cerebral and cerebellar infarcts as well as right temporal cortical venous infarct and cytotoxic edema  MRV persistent occlusion of 3 sinus, vein of Galan, right  IJ, right basal vein of [place]  CT head 4/17 increase edema in the left greater than right temporal and occipital lobes  CT head 4/19 decreased L>R edema. IVH same. Known SVT.  MRV 4/20 - dural venous sinus unchanged.   MRI 4/20 - left temporal and parietal significant petechial hemorrhage, likely due to significant CVST.   CT head 4/22 stable L temporal and occipital petechial hemorrhage. Stable IVH. Mild intracranial mass effect no midline shift. DVT stable w/ edema.  HIT antibody positive  On argatroban IV -> continue, may switch to DOAC  later. AC at least 6 months  Decadron 10mg  x 1 -> 4mg  q6  Off 3% saline   Na Q 6 hrs -?d/c  Na goal 150 - 155  Na 150     Repeat HIT ab pending   D/c potential offending meds - amiodraone, oxycodone, fioricet, imodium, seroquel  PLT 990->...->85     Check CT chest, abd/pelvis to rue out any possible pathology, was negative  CT Chest w contrast - 03/03/20 - Recent pulmonary embolus with development of pulmonary infarct in the right lower lobe. Nodular opacities in the right lower lobe have otherwise improved, likely improving infection.  CT Abdomen and Pelvis w contrast - 03/03/20 - Generalized body wall edema, with mild retroperitoneal edema, nonspecific. No evidence of other acute finding in the abdomen or pelvis.  Colonic diverticulosis without diverticulitis.   Q6h Na checks -changed to daily now  Headache, due to IVH and CSVT  Depakote 500 mg nightly discontinued  Pt now has Fioricet one Q8 hrs prn as well as tylenol prn  Solumedrol 500 mg -> prednisone -> off  NS at 100 cc's / hr started 4/10 -> 3% saline  Add topamax 25 bid  Decadron started 10 mg IV x 1 then 4 mg IV Q 6 hrs (started 02/27/20)  CT head 4/17 increase edema in the left greater than right temporal and occipital lobes  CT head 4/19 decreased L>R edema. IVH same. Known SVT.  MRI and MRV4/20 dural venous sinus unchanged. left temporal and parietal  significant petechial hemorrhage, likely due to significant CVST.   CT head 4/22  stable L temporal and occipital petechial hemorrhage. Stable IVH. Mild intracranial mass effect no midline shift. CSVT stable w/ edema.  Continue steroids until swelling decreasing  Afib RVR, new diagnosis  Rate in 160's -> back to sinus  Diltizem gtt discontinued  Amiodarone d/c'ed in concern of drug induced thrombocytopenia  TSH 5.311  Free T4 normal  OK with AC for 6 mos only due to acute illness and no recurrence of afib. Switch to DOAC once more stable  Cardiology signed off 02/26/20  Stopped amiodarone 4/22  Dysphagia  Made NPO status due to altered mental status  cortrak placed under fluoro 02/26/20 -> pt pulled off same day  cortrak replaced 4/19  On TF @ 60cc  Repeat swallow assessment as more awake  SLP on board  Hypertension  Home meds:  losartan 100, amlodipine 5  On losartan 50 bid and amlodipine 10 and hydralazine 100 tid . SBP goal < 140  off Cleviprex now   BP Stable  . 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 Multiple foci throughout progressed from previous MRI d/t microangiopathy vs amyloid.  As per husband, pt has some anomia as baseline  BP goal < 140  Hyperlipidemia  Home meds:  lipitor 10  LDL 92  Statin held in setting of acute ICH  Consider continuation of statin at discharge  Hyperglycemia  On steroids - decreased steroids Q12h today the daily tomorrow then stop     HgbA1c 6.1  CBGs q4h  SSI 0-15 -? Change to resistenc  increase novolog 2u q4h->    Hypokalemia and Hypomagnesemia and Hypophosphatemia  Potassium 4.2     Magnesium 2.1     Phosphorus 2.8    Other Stroke Risk Factors  Advanced age  Former Cigarette smoker  Family hx stroke (mother,  father, and multiple other family members)  Sedation  Stop all sedating meds - ativan, topamax. recently stopped Seroquel     Add amantadine 100 mg twice daily to help her be more alert     Add depakene 250 milligram 3 times daily to help with agitation as well as headaches  Other Active Problems  Hypothyroid on synthroid. TSH WNL - resume synthroid. Free T4 normal  Urinary retention, foley placed  Diarrhea with TF - on imodium PRN - now off  Agitation. Seroquel stopped d/t lowering PLTs.   Aortic Atherosclerosis (ICD10-I70.0)   Emphysema (ICD10-J43.9).   Anemia - ? Related to steroids-we will plan to taper and discontinue over the next 3 days  Patient seems more alert and interactive today after starting amantadine and removing some of her medicines.  Recommend taper her dexamethasone further to 4 mg every 12 hourly today and then 4 mg once a day tomorrow and stop.  Mobilize out of bed.  Physical occupational therapy consults.  Speech therapy for swallow eval as well as cognition. May switch to rivaroxaban if patient is able to swallow and continue IV argatroban till then Discussed with patient and husband and answered questions.  Discussed with Dr. Jana Hakim This patient is critically ill and at significant risk of neurological worsening, death and care requires constant monitoring of vital signs, hemodynamics,respiratory and cardiac monitoring, extensive review of multiple databases, frequent neurological assessment, discussion with family, other specialists and medical decision making of high complexity.I have made any additions or clarifications directly to the above note.This critical care time does not reflect procedure time, or teaching time or supervisory time of PA/NP/Med Resident etc but could involve care discussion time.  I spent 30 minutes of neurocritical care time  in the care of  this patient.     Hospital day # [place], MD  To contact Stroke  Continuity provider, please refer to [email or web address] After hours, contact General Neurology

## 2020-03-07 NOTE — Progress Notes (Signed)
[facility] for Argatroban Indication: HIT  Allergies  Allergen Reactions  . Erythromycin Itching and Other (See Comments)    Severe stomach pains, diarrhea  . Heparin     Likely HIT, SRA negative    Patient Measurements: Height: 5\' 2"  (157.5 cm) Weight: 76.9 kg (169 lb 8.5 oz) IBW/kg (Calculated) : 50.1  Vital Signs: Temp: 97.5 F (36.4 C) (04/27 0346) Temp Source: Oral (04/27 0346) BP: 150/73 (04/26 2126) Pulse Rate: 107 (04/26 2126)  Labs: Recent Labs    03/05/20 0357 03/05/20 1022 03/06/20 0538 03/06/20 0538 03/06/20 1000 03/06/20 1254 03/06/20 1745 03/06/20 2255 03/07/20 0608  HGB 8.3*  --  8.0*  --   --   --   --   --  8.6*  HCT 27.9*  --  26.7*  --   --   --   --   --  28.5*  PLT 85*  --  108*  --   --   --   --   --  149*  APTT 48*   < > 46*   < > 51*   < > 49* 49* 50*  CREATININE 0.75  --   --   --  0.67  --   --   --  0.85   < > = values in this interval not displayed.    Estimated Creatinine Clearance: 56.6 mL/min (by C-G formula based on SCr of 0.85 mg/dL).  Assessment: 74 year old initially admitted 4/4 with acute IVH and thrombocythemia with PLTC of 990k. Patient was ultimately diagnosed with polythemia, now s/p leukophoresis, therapeutic phlebotomy, and initiation of hydroxyurea. On 4/13 - found to have B/L PE - decision was risk of AC was too great and IVC placed. CT scan 4/15 showed extensive thrombus within confluence of sinuses and sent to IR with mechanical thrombectomy x5.  SRA resulted negative, Dr. Jana Hakim ordered repeat sent to different lab - still with working diagnosis of HIT d/t high pre-test probability. Also need to look into other causes for new TCP - though platelets now improving.   aPTT this evening is just below goal at 49 sec on agatroban 0.52 mcg/kg/min (goal 50-60 seconds). No reported bleeding. Per RN no issues with IV infusion. RN informed me the argatroban was moved back to the central  line and then the aPTT was drawn; the PIV was removed. RN is going to try to get another PIV to avoid the infusion and levels being drawn from the same line as this can introduce error in levels. Since IV line was the only variable that changed and 2 previous aPTT were therapeutic, will keep argatroban at current rate.  4/27 AM update:  APTT is therapeutic this AM   Goal of Therapy:  aPTT 50 - 60 seconds Monitor platelets by anticoagulation protocol: Yes   Plan: Continue argatroban at 0.52 mcg/kg/min (using previously weight of 69.9kg) q12h aPTTs  Narda Bonds, PharmD, BCPS Clinical Pharmacist Phone: [phone]

## 2020-03-07 NOTE — Progress Notes (Signed)
  Speech Language Pathology Treatment: Dysphagia;Cognitive-Linquistic  Patient Details Name: [name] MRN: [id number] DOB: 03/31/1946 Today's Date: 03/07/2020 Time: [id number] SLP Time Calculation (min) (ACUTE ONLY): 20 min  Assessment / Plan / Recommendation Clinical Impression  Pt is much more alert today but presents with receptive and expressive language deficits. Her expressive language is mostly characterized by learned phrases. SLP attempted automatic speech task (counting) but pt did not engage in task or repeat words. She does not respond to most yes/no questions and when she does, her answers are inaccurate. She needed Mod cues to follow one-step commands. She did take several PO trials, mostly consisting of water and applesauce, but after a few bites and sips she starting pursing her lips and turning away from SLP. RN reported similar behavior when attempting oral care. Pt had a little delayed throat clearing and multiple subswallows, but no overt coughing with the small amount that she consumed. Would offer her a few pieces of ice at a time after oral care when alert and accepting. SLP will f/u for additional trials to better assess oropharyngeal function.   HPI HPI: 74 y.o. female with history of HTN presenting with severe persistent HA accompanied by nausea and vomiting. Dx right lateral IVH. Developed acute PE; IVC filter placed 4/13. 4/15 head CT demonstrated deep cerebral thrombosis s/p IR for thrombectomy with expressive aphasia after. 4/16 MRI showed multiple cerebral and cerebellar infarcts, Rt temporal infarct with cytotoxic edema and hypertonic saline started.      SLP Plan  Continue with current plan of care       Recommendations  Diet recommendations: NPO;Other(comment)(ice chips after oral care) Medication Administration: Via alternative means                Oral Care Recommendations: Oral care QID Follow up Recommendations: Inpatient Rehab SLP Visit  Diagnosis: Aphasia (R47.01);Dysphagia, unspecified (R13.10) Plan: Continue with current plan of care       GO                 Osie Bond., M.A. [place] [facility] Pager [phone] Office [phone]  03/07/2020, 2:04 PM

## 2020-03-07 NOTE — Progress Notes (Signed)
[name]   DOB:1946/05/04   HE#:[id number]   JEH#:[id number]  Subjective:  [name]'s husband tells me yesterday she sat up some and interacted with him; he is encouraged, of course also very concerned.  Objective: white woman examined in bed, restraints in place Vitals:   03/07/20 0346 03/07/20 0727  BP:  138/80  Pulse:  94  Resp:  16  Temp: (!) 97.5 F (36.4 C) (!) 97.4 F (36.3 C)  SpO2:  100%    Body mass index is 31.01 kg/m.  Intake/Output Summary (Last 24 hours) at 03/07/2020 0811 Last data filed at 03/07/2020 3785 Gross per 24 hour  Intake 896.81 ml  Output 1100 ml  Net -203.19 ml   Lungs: no rhonchi or wheezes, auscultated anterolaterally Hart: RRR, no murmur appreciated Abd soft, +BS Neuro:sleeping; minimally arouses to voice and touch; no coherent verbal response  CBG (last 3)  Recent Labs    03/07/20 0012 03/07/20 0348 03/07/20 0730  GLUCAP 168* 139* 79     Labs:  Lab Results  Component Value Date   WBC 13.3 (H) 03/07/2020   HGB 8.6 (L) 03/07/2020   HCT 28.5 (L) 03/07/2020   MCV 101.8 (H) 03/07/2020   PLT 149 (L) 03/07/2020   NEUTROABS 10.5 (H) 03/07/2020    '@LASTCHEMISTRY' @  Urine Studies No results for input(s): UHGB, CRYS in the last 72 hours.  Invalid input(s): UACOL, UAPR, USPG, UPH, UTP, UGL, UKET, UBIL, UNIT, UROB, ULEU, UEPI, UWBC, URBC, UBAC, CAST, [place], [place]  Basic Metabolic Panel: Recent Labs  Lab 02/29/20 1607 02/29/20 2035 03/03/20 0501 03/03/20 1317 03/04/20 0339 03/04/20 1112 03/05/20 0357 03/05/20 0357 03/05/20 1215 03/05/20 1743 03/06/20 0115 03/06/20 1000 03/07/20 0608  NA 156*   < > 152*   < > 153*   < > 152*  152*   < > 150* 150* 148* 147* 146*  K  --    < > 4.6  --  4.5  --  4.2   < >  --   --   --  3.8 4.1  CL  --    < > 123*  --  123*  --  123*  --   --   --   --  117* 114*  CO2  --    < > 18*  --  20*  --  21*  --   --   --   --  23 25  GLUCOSE  --    < > 243*  --  191*  --  224*  --   --   --   --   207* 107*  BUN  --    < > 49*  --  47*  --  38*  --   --   --   --  31* 31*  CREATININE  --    < > 0.94  --  0.80  --  0.75  --   --   --   --  0.67 0.85  CALCIUM  --    < > 10.0  --  9.8  --  9.4  --   --   --   --  9.1 9.4  MG 2.1  --   --   --   --   --   --   --   --   --   --   --   --   PHOS 2.8  --   --   --   --   --   --   --   --   --   --   --   --    < > =  values in this interval not displayed.   GFR Estimated Creatinine Clearance: 56.6 mL/min (by C-G formula based on SCr of 0.85 mg/dL). Liver Function Tests: No results for input(s): AST, ALT, ALKPHOS, BILITOT, PROT, ALBUMIN in the last 168 hours. No results for input(s): LIPASE, AMYLASE in the last 168 hours. No results for input(s): AMMONIA in the last 168 hours. Coagulation profile No results for input(s): INR, PROTIME in the last 168 hours.  CBC: Recent Labs  Lab 03/03/20 0501 03/04/20 0339 03/05/20 0357 03/06/20 0538 03/07/20 0608  WBC 19.3* 16.7* 14.9* 12.2* 13.3*  NEUTROABS 16.7* 14.6* 12.9* 10.4* 10.5*  HGB 9.2* 8.6* 8.3* 8.0* 8.6*  HCT 29.2* 28.8* 27.9* 26.7* 28.5*  MCV 100.7* 102.1* 101.5* 101.5* 101.8*  PLT 52* 73* 85* 108* 149*   Cardiac Enzymes: No results for input(s): CKTOTAL, CKMB, CKMBINDEX, TROPONINI in the last 168 hours. BNP: Invalid input(s): POCBNP CBG: Recent Labs  Lab 03/06/20 1620 03/06/20 2120 03/07/20 0012 03/07/20 0348 03/07/20 0730  GLUCAP 155* 99 168* 139* 79   D-Dimer No results for input(s): DDIMER in the last 72 hours. Hgb A1c No results for input(s): HGBA1C in the last 72 hours. Lipid Profile Recent Labs    03/06/20 0115  TRIG 137   Thyroid function studies No results for input(s): TSH, T4TOTAL, T3FREE, THYROIDAB in the last 72 hours.  Invalid input(s): FREET3 Anemia work up No results for input(s): VITAMINB12, FOLATE, FERRITIN, TIBC, IRON, RETICCTPCT in the last 72 hours. Microbiology No results found for this or any previous visit (from the past 240  hour(s)).    Studies:  CT HEAD WO CONTRAST  Result Date: 03/06/2020 CLINICAL DATA:  Stroke follow-up. EXAM: CT HEAD WITHOUT CONTRAST TECHNIQUE: Contiguous axial images were obtained from the base of the skull through the vertex without intravenous contrast. COMPARISON:  Head CT March 02, 2020 FINDINGS: Brain: Redemonstrated prominent confluent hypodensity of the white matter of the cerebral hemispheres, unchanged. Hypodensity in the bilateral thalami also appear stable. There is also no significant change of the gyriform confluent hemorrhage in the left lateral temporo occipital region as well as intraventricular hemorrhage in the atria and occipital horns, right greater than left. No new focus of hemorrhage identified. No hydrocephalus, the large arterial territorial infarct, no mass lesion. Vascular: There is interval decrease in conspicuity of the hyperdensity within the vein of Galen and straight sinus. Calcified plaques in the bilateral carotid siphons. Skull: Normal. Negative for fracture or focal lesion. Sinuses/Orbits: No acute finding. Other: Nasoenteric tube again noted. IMPRESSION: 1. No significant change of the gyriform confluent hemorrhage in the left lateral temporo occipital region as well as intraventricular hemorrhage in the atria and occipital horns, right greater than left. No new focus of hemorrhage identified. 2. Redemonstrated prominent confluent hypodensity of the white matter of the cerebral hemispheres and bilateral thalami, stable 3. Interval decrease in conspicuity of the hyperdensity within the vein of Galen and straight sinus. Electronically Signed   By: Pedro Earls M.D.   On: 03/06/2020 11:50    Assessment: 74 y.o. [place] woman presenting 02/13/2020 with headache, found to have biventricular bleeds in the setting of panmyelosis, subsequently with pulmonary emboli and sinus venous thromboses in the setting of heparin induced thrombocytopenia  (1)  polycythemia vera  (a) JAK2 mutation positive confirming diagnosis  (b) bone marrow biopsy 02/13/2020 shows no evidence of leukemia, c/w P Vera  (c)  leukapheresis x3 . Last leukapheresis Friday 02/18/2020   (i) HD catheter removed after pheresis on  02/18/2020  (d) hydrea as needed to keep HCT <45 and platelets in normal range   (2) heparin induced thrombocytopenia: positive HIT screen  (a) severe thrombocythemia noted 02/24/2020  (b) bilateral pulmonary emboli 02/22/2020   (i) s/p IVC filter placement 02/22/2020   (ii) atrial fibrillation with RVR  (c) venous sinus thromboses 02/24/2020   (i) s/p mechanical thrombectomy  (d) argatroban started 02/25/2020  (e) platelet count responded to heparin withdrawal  (f) confirmatory serotonin release assay negative (see discussion below)   (i) continue argatroban for anticoagulation, no heparin   (ii) repeat HIT screen at [facility] lab again strongly positive  (3) drug-induced thrombocytopenia (see discussion below)  (a) discontinued amiodarone, inessential meds  (b) remains in sinus  Plan:  Chrishonda appears to be slowly turning the corner. Yesterday's head CT suggests some canalization in the v of Galen and straight sinus, no worsening hemorrhage. Counts are also improving: Hb and platelets both up; neutrophilia at least partly due to steroids.  The repeat HIT screen at [facility] is again strongly positive.   Plan from heme point of view is to continue argatroban until patient taking po's safely, then switch to rivaroxaban. When platelets > 400K will resume low-dose hydrea.  Will follow with you.    Chauncey Cruel, MD 03/07/2020  8:11 AM Medical Oncology and Hematology [facility] [address] [place], [place] [address] Tel. [phone]    Fax. [phone]

## 2020-03-07 NOTE — Progress Notes (Signed)
Physical Therapy Treatment Patient Details Name: [name] MRN: [id number] DOB: 06-Oct-1946 Today's Date: 03/07/2020    History of Present Illness 74 yo female presented with persistent HA, nausea and vomiting pt with stable R lateral ventricle IVH. Pt with acute bil PE 4/13, IVC filter placed 4/14. 4/15 head CT demonstrated deep cerebral thrombosis s/p IR for thrombectomy with expressive aphasia after. 4/16 MRI showed multiple cerebral and cerebellar infarcts, Rt temporal infarct with cytotoxic edema and hypertonic saline started.  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 PMHx: HTN, HLD, hep C, hypothyroidism    PT Comments    Patient seen with OT with pt requesting to have BM initially upon entry.  Able to roll only with total A for bed pan placement.  Assisted up to EOB and squat pivot to recliner with +2 max A and cues for technique.  Left with OT continuing in session, but pt noted to be more interactive this session.  Feel she will benefit from follow up CIR level rehab at d/c.    Follow Up Recommendations  CIR     Equipment Recommendations  Wheelchair (measurements PT);Wheelchair cushion (measurements PT);3in1 (PT);Hospital bed    Recommendations for Other Services       Precautions / Restrictions Precautions Precautions: Fall Precaution Comments: SBP <140, expressive/receptive difficulties  Restrictions Weight Bearing Restrictions: No    Mobility  Bed Mobility Overal bed mobility: Needs Assistance Bed Mobility: Sidelying to Sit;Rolling Rolling: Total assist;+2 for physical assistance Sidelying to sit: Total assist;+2 for physical assistance       General bed mobility comments: pt reports needing to void bowels upon entry; total A +2 to roll to position bed pan. pt required total A +2 to transition from supine>EOB. pt noted to grab onto rail but otherwise requried total A to maintain sidelying position and elevate trunk  Transfers Overall transfer  level: Needs assistance Equipment used: 2 person hand held assist Transfers: Sit to/from [employer] Transfers Sit to Stand: +2 physical assistance;From elevated surface;Max assist Stand pivot transfers: +2 physical assistance;Max assist       General transfer comment: MAX A +2 for stand pivot transfer to recliner. pt required asssit to power into standing and initiate pivotal steps to recliner  Ambulation/Gait                 Stairs             Wheelchair Mobility    Modified Rankin (Stroke Patients Only) Modified Rankin (Stroke Patients Only) Pre-Morbid Rankin Score: No symptoms Modified Rankin: Severe disability     Balance Overall balance assessment: Needs assistance Sitting-balance support: Feet unsupported;Bilateral upper extremity supported Sitting balance-Leahy Scale: Poor Sitting balance - Comments: MINA for sitting balance EOB Postural control: Right lateral lean Standing balance support: Bilateral upper extremity supported Standing balance-Leahy Scale: Poor Standing balance comment: reliant on BUE support and external assist                            Cognition Arousal/Alertness: Awake/alert Behavior During Therapy: Flat affect Overall Cognitive Status: Impaired/Different from baseline Area of Impairment: Orientation;Attention;Memory;Following commands;Safety/judgement;Awareness;Problem solving                 Orientation Level: Disoriented to;Person;Place;Time;Situation Current Attention Level: Sustained Memory: Decreased short-term memory Following Commands: Follows one step commands with increased time Safety/Judgement: Decreased awareness of deficits Awareness: Intellectual Problem Solving: Decreased initiation;Difficulty sequencing;Requires verbal cues;Requires tactile cues General Comments: pt  unable to state location or date but responds appropriately to name. Pt noted to reach to comb fingers through hair but when  handed comb but unable to use. turned on music with pt not attending to [place] songs previously documented. pt however speaking intelligibly and stating "that was nice" at end of session      Exercises      General Comments General comments (skin integrity, edema, etc.): VSS, reapplied mitts at end of session      Pertinent Vitals/Pain Pain Assessment: Faces Faces Pain Scale: No hurt    Home Living                      Prior Function            PT Goals (current goals can now be found in the care plan section) Acute Rehab PT Goals Patient Stated Goal: Pt speaking more today one word and short phrase responses.  Progress towards PT goals: Progressing toward goals    Frequency    Min 4X/week      PT Plan Current plan remains appropriate    Co-evaluation PT/OT/SLP Co-Evaluation/Treatment: Yes Reason for Co-Treatment: Complexity of the patient's impairments (multi-system involvement);Necessary to address cognition/behavior during functional activity;For patient/therapist safety;To address functional/ADL transfers PT goals addressed during session: Mobility/safety with mobility;Balance OT goals addressed during session: ADL's and self-care      AM-PAC PT "6 Clicks" Mobility   Outcome Measure  Help needed turning from your back to your side while in a flat bed without using bedrails?: Total Help needed moving from lying on your back to sitting on the side of a flat bed without using bedrails?: Total Help needed moving to and from a bed to a chair (including a wheelchair)?: A Lot Help needed standing up from a chair using your arms (e.g., wheelchair or bedside chair)?: Total Help needed to walk in hospital room?: Total Help needed climbing 3-5 steps with a railing? : Total 6 Click Score: 7    End of Session Equipment Utilized During Treatment: Gait belt Activity Tolerance: Patient limited by lethargy Patient left: in chair;with restraints reapplied   PT  Visit Diagnosis: Other abnormalities of gait and mobility (R26.89);Other symptoms and signs involving the nervous system (R29.898)     Time: [id number] PT Time Calculation (min) (ACUTE ONLY): 16 min  Charges:  $Therapeutic Activity: 8-22 mins                     Magda Kiel, [place] [facility] [phone] 03/07/2020    Reginia Naas 03/07/2020, 5:39 PM

## 2020-03-07 NOTE — Progress Notes (Signed)
Nutrition Follow-up  DOCUMENTATION CODES:   Not applicable  INTERVENTION:   Tube feeding:  -Glucerna 1.2 @ 60 ml/hr via Cortrak -30 ml Prostat daily  Provides: 1828 kcals, 101 grams protein, 1167 ml free water.    NUTRITION DIAGNOSIS:   Inadequate oral intake related to inability to eat as evidenced by NPO status. Ongoing.   GOAL:   Patient will meet greater than or equal to 90% of their needs Meeting with TF.   MONITOR:   Weight trends, Labs, I & O's, Diet advancement, Skin, TF tolerance  REASON FOR ASSESSMENT:   Consult Enteral/tube feeding initiation and management  ASSESSMENT:   Patient with PMH significant for HTN, HLD, and Hepatitis C. Presents this admission with right lateral ventricle IVH secondary to HTN.   Pt discussed during ICU rounds and with RN.  Pt started TF on day 14 of hospitalization.  4/4 admitted for IVH secondary to HTN dx with polycythemia vera  4/13 respiratory distress secondary to acute bilateral emboli s/p IVC filter placement 4/15 s/p mechanical thrombectomy  4/17 small bore feeding tube place in IR but pulled out the same day 4/18 pt started on cleveprix and 3% due to worsening edema  4/19 cortrak placed 4/20 pt failed swallow eval due to lethargy  4/26 RD adjusted TF to Glucerna due to ongoing blood sugar issues   Weight history limited over the last year. Use 66.3 kg as EDW.   Medications: decadron, SSI, 5 units novolog every 4 hours, 40 mEq KCl daily, senokot-s Labs: Na 146 (H)  CBG [id number] Edema: BLE deep pitting edema   Diet Order:   Diet Order            Diet NPO time specified  Diet effective now              EDUCATION NEEDS:   Not appropriate for education at this time  Skin:  Skin Assessment: Skin Integrity Issues: Skin Integrity Issues:: Incisions Incisions: coccyx, R knee  Last BM:  4/26  Height:   Ht Readings from Last 1 Encounters:  02/14/20 5\' 2"  (1.575 m)    Weight:   Wt Readings from  Last 1 Encounters:  03/07/20 76.9 kg    BMI:  Body mass index is 31.01 kg/m.  Estimated Nutritional Needs:   Kcal:  1700-1900 kcal  Protein:  85-100 grams  Fluid:  >/= 1.7 L/day   Lockie Pares., RD, LDN, CNSC See AMiON for contact information

## 2020-03-07 NOTE — Progress Notes (Signed)
[facility] for Argatroban Indication: HIT  Allergies  Allergen Reactions  . Erythromycin Itching and Other (See Comments)    Severe stomach pains, diarrhea  . Heparin     Likely HIT, SRA negative    Patient Measurements: Height: 5\' 2"  (157.5 cm) Weight: 76.9 kg (169 lb 8.5 oz) IBW/kg (Calculated) : 50.1  Vital Signs: Temp: 97.8 F (36.6 C) (04/27 1629) Temp Source: Oral (04/27 1629) BP: 139/66 (04/27 1629) Pulse Rate: 99 (04/27 1629)  Labs: Recent Labs    03/05/20 0357 03/05/20 1022 03/06/20 0538 03/06/20 0538 03/06/20 1000 03/06/20 1254 03/06/20 2255 03/07/20 0608 03/07/20 1640  HGB 8.3*  --  8.0*  --   --   --   --  8.6*  --   HCT 27.9*  --  26.7*  --   --   --   --  28.5*  --   PLT 85*  --  108*  --   --   --   --  149*  --   APTT 48*   < > 46*   < > 51*   < > 49* 50* 47*  CREATININE 0.75  --   --   --  0.67  --   --  0.85  --    < > = values in this interval not displayed.    Estimated Creatinine Clearance: 56.6 mL/min (by C-G formula based on SCr of 0.85 mg/dL).  Assessment: 74 year old initially admitted 4/4 with acute IVH and thrombocythemia with PLTC of 990k. Patient was ultimately diagnosed with polythemia, now s/p leukophoresis, therapeutic phlebotomy, and initiation of hydroxyurea. On 4/13 - found to have B/L PE - decision was risk of AC was too great and IVC placed. CT scan 4/15 showed extensive thrombus within confluence of sinuses and sent to IR with mechanical thrombectomy x5.  SRA resulted negative, Dr. Jana Hakim ordered repeat sent to different lab - still with working diagnosis of HIT d/t high pre-test probability. Also need to look into other causes for new TCP - though platelets now improving to 149. Hg low but stable.  aPTT this evening is just below goal at 47 sec on agatroban 0.52 mcg/kg/min (goal 50-60 seconds). No reported bleeding. Per RN no issues with IV infusion.  Goal of Therapy:  aPTT 50 - 60  seconds Monitor platelets by anticoagulation protocol: Yes   Plan: Increase argatroban slightly to 0.57 mcg/kg/min (using previously weight of 69.9kg) Continue q12h aPTT checks Monitor daily CBC, s/sx bleeding   Arturo Morton, PharmD, BCPS Please check AMION for all [place] contact numbers Clinical Pharmacist 03/07/2020 6:09 PM

## 2020-03-07 NOTE — Progress Notes (Signed)
PT Note  Patient sliding out of chair and incontinent of urine so called RN in and assisted to bed via Stedy and with assist for hygiene.  Patient fatigued and seemingly in pain with nursing attending to pericare.      03/07/20 1741  PT Visit Information  Last PT Received On 03/07/20  Assistance Needed +2  History of Present Illness 74 yo female presented with persistent HA, nausea and vomiting pt with stable R lateral ventricle IVH. Pt with acute bil PE 4/13, IVC filter placed 4/14. 4/15 head CT demonstrated deep cerebral thrombosis s/p IR for thrombectomy with expressive aphasia after. 4/16 MRI showed multiple cerebral and cerebellar infarcts, Rt temporal infarct with cytotoxic edema and hypertonic saline started.  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 PMHx: HTN, HLD, hep C, hypothyroidism  Precautions  Precautions Fall  Precaution Comments SBP <140, expressive/receptive difficulties   Pain Assessment  Pain Assessment Faces  Faces Pain Scale 6  Pain Location generalized with mobility back to bed  Pain Descriptors / Indicators Grimacing;Moaning  Pain Intervention(s) Monitored during session;Repositioned  Cognition  Arousal/Alertness Awake/alert  Behavior During Therapy Restless  Overall Cognitive Status Impaired/Different from baseline  Area of Impairment Orientation;Attention;Memory;Following commands;Safety/judgement;Awareness;Problem solving  Orientation Level Disoriented to;Person;Place;Time;Situation  Current Attention Level Sustained  Memory Decreased short-term memory  Following Commands Follows one step commands with increased time  Safety/Judgement Decreased awareness of deficits  Awareness Intellectual  Problem Solving Decreased initiation;Difficulty sequencing;Requires verbal cues;Requires tactile cues  General Comments leaning down to R and scooted to edge of chair with legs off L side and moaning up in chair  Bed Mobility  Bed Mobility Sit to Supine  Sit to  supine +2 for safety/equipment;Max assist  General bed mobility comments nursing assisted to help to get pt to supine   Transfers  Overall transfer level Needs assistance  Transfer via Lift Equipment Stedy  Transfers Sit to/from [employer] Transfers  Sit to Stand +2 physical assistance;Max assist  Stand pivot transfers +2 physical assistance;Total assist (with stedy)  General transfer comment patient had urinated in chair and feet slipping on floor though she was trying to initiate scooting back in chair, feet were slipping on floor and nursing wanted to perform pericare so stood with +2 A to [place] with weak LE's flexed against pad on stedy, pivot on seat on steady flexed over reail and +2 A for getting up for back to bed  PT - End of Session  Equipment Utilized During Treatment Other (comment) (stedy)  Activity Tolerance Patient limited by fatigue  Patient left in bed;with nursing/sitter in room   PT - Assessment/Plan  PT Plan Current plan remains appropriate  PT Visit Diagnosis Other abnormalities of gait and mobility (R26.89);Other symptoms and signs involving the nervous system (R29.898)  PT Frequency (ACUTE ONLY) Min 4X/week  Follow Up Recommendations CIR  PT equipment Wheelchair (measurements PT);Wheelchair cushion (measurements PT);3in1 (PT);Hospital bed  AM-PAC PT "6 Clicks" Mobility Outcome Measure (Version 2)  Help needed turning from your back to your side while in a flat bed without using bedrails? 1  Help needed moving from lying on your back to sitting on the side of a flat bed without using bedrails? 1  Help needed moving to and from a bed to a chair (including a wheelchair)? 2  Help needed standing up from a chair using your arms (e.g., wheelchair or bedside chair)? 1  Help needed to walk in hospital room? 1  Help needed climbing 3-5 steps  with a railing?  1  6 Click Score 7  Consider Recommendation of Discharge To: CIR/SNF/LTACH  PT Goal Progression  Progress  towards PT goals Progressing toward goals  PT Time Calculation  PT Start Time (ACUTE ONLY) 1455  PT Stop Time (ACUTE ONLY) 1510  PT Time Calculation (min) (ACUTE ONLY) 15 min  PT General Charges  $$ ACUTE PT VISIT 1 Visit  PT Treatments  $Therapeutic Activity 8-22 mins   Magda Kiel, PT Acute Rehabilitation Services [phone] 03/07/2020

## 2020-03-07 NOTE — Progress Notes (Signed)
Occupational Therapy Treatment Patient Details Name: [name] MRN: [id number] DOB: 03/22/1946 Today's Date: 03/07/2020    History of present illness 74 yo female presented with persistent HA, nausea and vomiting pt with stable R lateral ventricle IVH. Pt with acute bil PE 4/13, IVC filter placed 4/14. 4/15 head CT demonstrated deep cerebral thrombosis s/p IR for thrombectomy with expressive aphasia after. 4/16 MRI showed multiple cerebral and cerebellar infarcts, Rt temporal infarct with cytotoxic edema and hypertonic saline started.  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 PMHx: HTN, HLD, hep C, hypothyroidism   OT comments  Pt making steady progress towards OT goals this session. Session focus on functional mobility and seated ADLs. Pt continues to present with decreased cognition (although much more alert this session), decreased activity tolerance and impaired balance and generalized weakness. Overall, pt requires total A +2 for bed mobility and MAX A+2 for stand pivot transfer to recliner. Trialed use of comb for seated grooming task with pt unable to use effectively without hand over hand assist. DC plan currently remains appropriate, will follow acutely per POC.    Follow Up Recommendations  CIR;Supervision/Assistance - 24 hour    Equipment Recommendations  3 in 1 bedside commode;Other (comment)    Recommendations for Other Services      Precautions / Restrictions Precautions Precautions: Fall Precaution Comments: SBP <140, expressive/receptive difficulties  Restrictions Weight Bearing Restrictions: No       Mobility Bed Mobility Overal bed mobility: Needs Assistance Bed Mobility: Rolling;Sidelying to Sit Rolling: Total assist;+2 for physical assistance Sidelying to sit: Total assist;+2 for physical assistance       General bed mobility comments: pt reports needing to void bowels upon entry; total A +2 to roll to position bed pan. pt required total A +2  to transition from supine>EOB. pt noted to grab onto rail but otherwise requried total A to maintain sidelying position and elevate trunk  Transfers Overall transfer level: Needs assistance Equipment used: 2 person hand held assist Transfers: Sit to/from [employer] Transfers Sit to Stand: +2 physical assistance;From elevated surface;Max assist Stand pivot transfers: +2 physical assistance;Max assist       General transfer comment: MAX A +2 for stand pivot transfer to recliner. pt required asssit to power into standing and initiate pivotal steps to recliner    Balance Overall balance assessment: Needs assistance Sitting-balance support: Feet unsupported;Bilateral upper extremity supported Sitting balance-Leahy Scale: Poor Sitting balance - Comments: MINA for sitting balance EOB   Standing balance support: Bilateral upper extremity supported Standing balance-Leahy Scale: Poor Standing balance comment: reliant on BUE support and external assist                           ADL either performed or assessed with clinical judgement   ADL Overall ADL's : Needs assistance/impaired     Grooming: Total assistance;Brushing hair Grooming Details (indicate cue type and reason): pt noted to comb through hair with fingers but unable to carryover using brush for functional task     Lower Body Bathing: Total assistance;Bed level       Lower Body Dressing: Total assistance;Bed level Lower Body Dressing Details (indicate cue type and reason): to don socks Toilet Transfer: Moderate assistance;Maximal assistance;+2 for physical assistance;Stand-pivot Toilet Transfer Details (indicate cue type and reason): pt complete simulated toilet transfer to recliner with MOD- MAX A +2         Functional mobility during ADLs: Moderate assistance;Maximal  assistance;+2 for physical assistance;+2 for safety/equipment General ADL Comments: pt continues to present with decreased cognition,  decreased activity tolerance, decreased strength. session focus on functional mobility and seated ADLs     Vision       Perception     Praxis      Cognition Arousal/Alertness: Awake/alert Behavior During Therapy: Flat affect Overall Cognitive Status: Impaired/Different from baseline Area of Impairment: Orientation;Attention;Memory;Following commands;Safety/judgement;Awareness;Problem solving                 Orientation Level: Disoriented to;Person;Place;Time;Situation Current Attention Level: Sustained Memory: Decreased short-term memory Following Commands: Follows one step commands with increased time Safety/Judgement: Decreased awareness of deficits Awareness: Intellectual Problem Solving: Decreased initiation;Difficulty sequencing;Requires verbal cues;Requires tactile cues General Comments: pt unable to state location or date but responds appropriately to name. Pt noted to reach to comb fingers through hair but when handed comb but unable to use. turned on music with pt not attending to [place] songs previously documented. pt however speaking intelligibly and stating "that was nice" at end of session        Exercises     Shoulder Instructions       General Comments VSS during session BP 108/71 post transfer to recliner    Pertinent Vitals/ Pain       Pain Assessment: Faces Faces Pain Scale: No hurt  Home Living                                          Prior Functioning/Environment              Frequency  Min 2X/week        Progress Toward Goals  OT Goals(current goals can now be found in the care plan section)  Progress towards OT goals: Progressing toward goals  Acute Rehab OT Goals Patient Stated Goal: Pt speaking more today one word and short phrase responses.  OT Goal Formulation: With patient Time For Goal Achievement: 03/13/20 Potential to Achieve Goals: Good  Plan Discharge plan remains appropriate     Co-evaluation    PT/OT/SLP Co-Evaluation/Treatment: Yes Reason for Co-Treatment: Complexity of the patient's impairments (multi-system involvement);Necessary to address cognition/behavior during functional activity;For patient/therapist safety;To address functional/ADL transfers   OT goals addressed during session: ADL's and self-care      AM-PAC OT "6 Clicks" Daily Activity     Outcome Measure   Help from another person eating meals?: Total Help from another person taking care of personal grooming?: A Lot Help from another person toileting, which includes using toliet, bedpan, or urinal?: Total Help from another person bathing (including washing, rinsing, drying)?: A Lot Help from another person to put on and taking off regular upper body clothing?: A Lot Help from another person to put on and taking off regular lower body clothing?: Total 6 Click Score: 9    End of Session Equipment Utilized During Treatment: Gait belt  OT Visit Diagnosis: Unsteadiness on feet (R26.81);Muscle weakness (generalized) (M62.81);Other symptoms and signs involving cognitive function;Other symptoms and signs involving the nervous system (R29.898)   Activity Tolerance Patient tolerated treatment well   Patient Left in chair;with call bell/phone within reach;with chair alarm set   Nurse Communication Mobility status        Time: [id number] OT Time Calculation (min): 29 min  Charges: OT General Charges $OT Visit: 1 Visit OT Treatments $Self Care/Home Management : 8-22 mins  Lanier Clam., COTA/L Acute Rehabilitation Services [phone] [place] 03/07/2020, 3:03 PM

## 2020-03-08 DIAGNOSIS — I615 Nontraumatic intracerebral hemorrhage, intraventricular: Secondary | ICD-10-CM | POA: Diagnosis not present

## 2020-03-08 LAB — GLUCOSE, CAPILLARY
Glucose-Capillary: 111 mg/dL — ABNORMAL HIGH (ref 70–99)
Glucose-Capillary: 113 mg/dL — ABNORMAL HIGH (ref 70–99)
Glucose-Capillary: 143 mg/dL — ABNORMAL HIGH (ref 70–99)
Glucose-Capillary: 149 mg/dL — ABNORMAL HIGH (ref 70–99)
Glucose-Capillary: 159 mg/dL — ABNORMAL HIGH (ref 70–99)
Glucose-Capillary: 59 mg/dL — ABNORMAL LOW (ref 70–99)
Glucose-Capillary: 90 mg/dL (ref 70–99)

## 2020-03-08 LAB — BASIC METABOLIC PANEL
Anion gap: 11 (ref 5–15)
BUN: 32 mg/dL — ABNORMAL HIGH (ref 8–23)
CO2: 23 mmol/L (ref 22–32)
Calcium: 9.4 mg/dL (ref 8.9–10.3)
Chloride: 108 mmol/L (ref 98–111)
Creatinine, Ser: 0.9 mg/dL (ref 0.44–1.00)
GFR calc Af Amer: >60 mL/min (ref >60)
GFR calc non Af Amer: >60 mL/min (ref >60)
Glucose, Bld: 77 mg/dL (ref 70–99)
Potassium: 3.9 mmol/L (ref 3.5–5.1)
Sodium: 142 mmol/L (ref 135–145)

## 2020-03-08 LAB — CBC WITH DIFFERENTIAL/PLATELET
Abs Immature Granulocytes: 0.56 10*3/uL — ABNORMAL HIGH (ref 0.00–0.07)
Basophils Absolute: 0 10*3/uL (ref 0.0–0.1)
Basophils Relative: 0 %
Eosinophils Absolute: 0.1 10*3/uL (ref 0.0–0.5)
Eosinophils Relative: 1 %
HCT: 29 % — ABNORMAL LOW (ref 36.0–46.0)
Hemoglobin: 8.8 g/dL — ABNORMAL LOW (ref 12.0–15.0)
Immature Granulocytes: 4 %
Lymphocytes Relative: 6 %
Lymphs Abs: 0.9 10*3/uL (ref 0.7–4.0)
MCH: 31.1 pg (ref 26.0–34.0)
MCHC: 30.3 g/dL (ref 30.0–36.0)
MCV: 102.5 fL — ABNORMAL HIGH (ref 80.0–100.0)
Monocytes Absolute: 1.1 10*3/uL — ABNORMAL HIGH (ref 0.1–1.0)
Monocytes Relative: 8 %
Neutro Abs: 11.3 10*3/uL — ABNORMAL HIGH (ref 1.7–7.7)
Neutrophils Relative %: 81 %
Platelets: 188 10*3/uL (ref 150–400)
RBC: 2.83 MIL/uL — ABNORMAL LOW (ref 3.87–5.11)
RDW: 19.9 % — ABNORMAL HIGH (ref 11.5–15.5)
WBC: 14 10*3/uL — ABNORMAL HIGH (ref 4.0–10.5)
nRBC: 0.1 % (ref 0.0–0.2)

## 2020-03-08 LAB — APTT
aPTT: 44 seconds — ABNORMAL HIGH (ref 24–36)
aPTT: 46 seconds — ABNORMAL HIGH (ref 24–36)
aPTT: 52 seconds — ABNORMAL HIGH (ref 24–36)

## 2020-03-08 LAB — VITAMIN B12: Vitamin B-12: 298 pg/mL (ref 180–914)

## 2020-03-08 MED ORDER — ARGATROBAN 50 MG/50ML IV SOLN
0.6500 ug/kg/min | INTRAVENOUS | Status: DC
  Administered 2020-03-08: 0.65 ug/kg/min via INTRAVENOUS
  Filled 2020-03-08 (×5): qty 50

## 2020-03-08 MED ORDER — DEXTROSE 50 % IV SOLN
INTRAVENOUS | Status: AC
  Administered 2020-03-08: 25 mL
  Filled 2020-03-08: qty 50

## 2020-03-08 MED ORDER — LORAZEPAM 2 MG/ML IJ SOLN
0.5000 mg | Freq: Once | INTRAMUSCULAR | Status: AC
  Administered 2020-03-08: 0.5 mg via INTRAVENOUS
  Filled 2020-03-08: qty 1

## 2020-03-08 NOTE — Progress Notes (Addendum)
[facility] for Argatroban Indication: HIT  Allergies  Allergen Reactions  . Erythromycin Itching and Other (See Comments)    Severe stomach pains, diarrhea  . Heparin     Likely HIT, SRA negative    Patient Measurements: Height: 5\' 2"  (157.5 cm) Weight: 79.7 kg (175 lb 11.3 oz) IBW/kg (Calculated) : 50.1  Vital Signs: Temp: 98.2 F (36.8 C) (04/28 0733) Temp Source: Oral (04/28 0733) BP: 156/76 (04/28 0733) Pulse Rate: 97 (04/28 0733)  Labs: Recent Labs    03/06/20 0538 03/06/20 0538 03/06/20 1000 03/06/20 1254 03/07/20 0608 03/07/20 1640 03/08/20 0500  HGB 8.0*   < >  --   --  8.6*  --  8.8*  HCT 26.7*  --   --   --  28.5*  --  29.0*  PLT 108*  --   --   --  149*  --  188  APTT 46*   < > 51*   < > 50* 47* 44*  CREATININE  --   --  0.67  --  0.85  --  0.90   < > = values in this interval not displayed.    Estimated Creatinine Clearance: 54.4 mL/min (by C-G formula based on SCr of 0.9 mg/dL).  Assessment: 74 year old initially admitted 4/4 with acute IVH and thrombocythemia with PLTC of 990k. Patient was ultimately diagnosed with polythemia, now s/p leukophoresis, therapeutic phlebotomy, and initiation of hydroxyurea. On 4/13 - found to have B/L PE - decision was risk of AC was too great and IVC placed. CT scan 4/15 showed extensive thrombus within confluence of sinuses and sent to IR with mechanical thrombectomy x5.  SRA resulted negative, Dr. Jana Hakim ordered repeat sent to different lab - still with working diagnosis of HIT d/t high pre-test probability. Also need to look into other causes for new TCP - though platelets now improving to 149. Hg low but stable.  aPTT this morning is low at 44 sec on agatroban 0.36mcg/kg/min (goal 50-60 seconds). We had been using patient's previous weight for dosing, but is now 79.7 kg  No reported bleeding. Per RN no issues with IV infusion.  Goal of Therapy:  aPTT 50 - 60  seconds Monitor platelets by anticoagulation protocol: Yes   Plan: Continue argatroban at 0.57 mcg/kg/min but adjust to current weight 1115 aPTT Continue q12h aPTT checks Monitor daily CBC, s/sx bleeding  Barth Kirks, PharmD, BCPS, BCCCP Clinical Pharmacist [phone]  Please check AMION for all [place] numbers  03/08/2020 9:05 AM   ADDENDUM:  Recheck of aPTT this morning was still low at 46  Plan: Increase to 0.65 mcg/kg/min Recheck aPTT at [place], PharmD, BCPS, [place] Pharmacist [phone]  Please check AMION for all [place] numbers  03/08/2020 12:44 PM

## 2020-03-08 NOTE — Progress Notes (Signed)
Inpatient Rehabilitation Admissions Coordinator  I will begin insurance authorization with [facility] for a possible inpt rehab admit.   Danne Baxter, RN, MSN Rehab Admissions Coordinator [phone] 03/08/2020 4:19 PM

## 2020-03-08 NOTE — Progress Notes (Signed)
STROKE TEAM PROGRESS NOTE   INTERVAL HISTORY Patient  is sitting up in bed.  She is alert and interactive.  She is able to speak short sentences but remains confused and disoriented and suspect receptive and expressive aphasic.  She follows simple midline and one-step commands and moves all 4 extremities against gravity.  She is needing mitts in both hands to restrain her.  Vital signs are stable.  Platelet counts are now up to 1 88,000.  And hematocrit is stable at 29.  Her husband and son are at the bedside.  [name] Kitchen Headache improved but present Vitals:   03/08/20 0500 03/08/20 0733 03/08/20 1120 03/08/20 1130  BP:  (!) 156/76 (!) 166/61   Pulse:  97 93   Resp:  13 17   Temp:  98.2 F (36.8 C) 98 F (36.7 C)   TempSrc:  Oral Axillary   SpO2:  100% 100%   Weight: 79.7 kg   75.6 kg  Height:       CBC:  Recent Labs  Lab 03/07/20 0608 03/08/20 0500  WBC 13.3* 14.0*  NEUTROABS 10.5* 11.3*  HGB 8.6* 8.8*  HCT 28.5* 29.0*  MCV 101.8* 102.5*  PLT 149* 0000000   Basic Metabolic Panel:  Recent Labs  Lab 03/07/20 0608 03/08/20 0500  NA 146* 142  K 4.1 3.9  CL 114* 108  CO2 25 23  GLUCOSE 107* 77  BUN 31* 32*  CREATININE 0.85 0.90  CALCIUM 9.4 9.4    IMAGING past 24 hours No results found.   PHYSICAL EXAM      General - Well nourished, well developed elderly Caucasian lady, restless and agitated with exam  Ophthalmologic - fundi not visualized due to noncooperation.She resists eye opening.   Cardiovascular - Regular rhythm and rate, not in afib.  Neuro -patient is awake and alert today.  She has mild dysarthria but can be clearly understood and speaks short sentences.  She has mixed aphasia with some word finding difficulties and nonfluent speech.  She can follow only simple midline and one-step commands.  She has diminished attention registration and recall.  She is disoriented.  She cannot name her son she blinks to threat more on the left than on the right.  No facial weakness.   Tongue midline.  She moves all 4 extremities well against gravity without focal weakness.  . Sensation, coordination not cooperative and gait not tested.   ASSESSMENT/PLAN [name] is a 74 y.o. female with history of HTN presenting with severe persistent HA accompanied by nausea and vomiting. This has now improved.    1.  Stroke: multiple punctate B anterior and posterior circulation embolic infarcts in setting of PAF and Polycythemia Vera with clotting issues 2.  IVH - right lateral ventricle IVH secondary to ? CAA vs. HTN  CT head 4/5 0032 R lateral IVH. Severe chronic ischemic microangiopathy.  MRI 4/5 Stable IVH, severe leukoaraiosis. Multiple foci throughout progressed from previous MRI d/t microangiopathy vs amyloid.  MRA unremarkable   CT head repeat 4/5-4/10 - unchanged size and configuration of right IVH  CT Head 02/26/20 - Increased edema in the left greater than right temporal and occipital lobes which may reflect worsening edema from venous hypertension and venous infarcts.   CT head 02/28/20 decreased L>R edema. IVH same. Known SVT.    MRI 4/20 - left temporal and parietal significant petechial hemorrhage, likely due to significant CVST. Numerous punctate infarcts throughout brain. Numerous chronic microhemorrhages c/w CAA.  CT  head 4/22 stable L temporal and occipital petechial hemorrhage. Stable IVH. Mild intracranial mass effect no midline shift. CSVT stable w/ edema.  CT Chest w contrast - 03/03/20 - Recent pulmonary embolus with development of pulmonary infarct in the right lower lobe. Nodular opacities in the right lower lobe have otherwise improved, likely improving infection.  CT Abdomen and Pelvis w contrast - 03/03/20 - Generalized body wall edema, with mild retroperitoneal edema, nonspecific. No evidence of other acute finding in the abdomen or pelvis.  Colonic diverticulosis without diverticulitis.   EEG - moderate diffuse encephalopathy  2D Echo EF  65-70%. No source of embolus   LDL 92   HgbA1c 6.1   DVT prophylaxis - argatroban IV   aspirin 81 mg daily prior to admission, now on argatroban due to HIT -:Plan AC for at least 6 mos. 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 Dr. Ron Agee reaching out to Dr. Gwenlyn Found to discuss stopping amiodarone   LDH 427 PLT ct c/w citrate adamts 13 activity pending HIT ab 2.553 -> 1.469 reticulocytes 76.4  Hematology on board   HIT syndrome  B/l PE cerebral venous sinus thrombosis s/p IR ? Drug induced thrombocytopenia  4/13 New SOB and tachycardia  CTA chest - BLE PE.   Filter placement on 4/13 as unable to get [facility] d/t IVH  4/15 CT head - straight sinus and vein of Galan CSVT  CTV Confirmed extensive thrombus within the confluence of sinuses and straight sinus, also extending into the vein of Galen. There is also fairly extensive multifocal thrombus within the right greater than left transverse and sigmoid dural venous sinuses.  S/p mechanical thrombectomy 02/24/20 of occluded deep cerebral veins  MRI 02/25/2020 shows multiple by cerebral and cerebellar infarcts as well as right temporal cortical venous infarct and cytotoxic edema  MRV persistent occlusion of 3 sinus, vein of Galan, right IJ, right basal vein of Rosenthal  CT head 4/17 increase edema in the left greater than right temporal and occipital lobes  CT head 4/19 decreased L>R edema. IVH same. Known SVT.  MRV 4/20 - dural venous sinus  unchanged.   MRI 4/20 - left temporal and parietal significant petechial hemorrhage, likely due to significant CVST.   CT head 4/22 stable L temporal and occipital petechial hemorrhage. Stable IVH. Mild intracranial mass effect no midline shift. DVT stable w/ edema.  HIT antibody positive  On argatroban IV -> continue, may switch to DOAC later. AC at least 6 months  Decadron 10mg  x 1 -> 4mg  q6  Off 3% saline   Na Q 6 hrs -?d/c  Na goal 150 - 155  Na 150     Repeat HIT ab pending   D/c potential offending meds - amiodraone, oxycodone, fioricet, imodium, seroquel  PLT 990->...->85     CT chest, abd/pelvis to rue out any possible pathology, was negative  CT Chest w contrast - 03/03/20 - Recent pulmonary embolus with development of pulmonary infarct in the right  lower lobe. Nodular opacities in the right lower lobe have otherwise improved, likely improving infection.  CT Abdomen and Pelvis w contrast - 03/03/20 - Generalized body wall edema, with mild retroperitoneal edema, nonspecific. No evidence of other acute finding in the abdomen or pelvis.  Colonic diverticulosis without diverticulitis.   Q6h Na checks -changed to daily now  Headache, due to IVH and CSVT  Depakote 500 mg nightly discontinued  Pt now has Fioricet one Q8 hrs prn as well as tylenol prn  Solumedrol 500 mg -> prednisone -> off  NS at 100 cc's / hr started 4/10 -> 3% saline  Add topamax 25 bid  Decadron started 10 mg IV x 1 then 4 mg IV Q 6 hrs (started 02/27/20) tapered and will discontinue 03/09/2020  CT head 4/17 increase edema in the left greater than right temporal and occipital lobes  CT head 4/19 decreased L>R edema. IVH same. Known SVT.  MRI and MRV4/20 dural venous sinus unchanged. left temporal and parietal significant petechial hemorrhage, likely due to significant CVST.   CT head 4/22  stable L temporal and occipital petechial hemorrhage. Stable IVH. Mild intracranial mass effect no midline  shift. CSVT stable w/ edema.  Continue steroids until swelling decreasing  Afib RVR, new diagnosis  Rate in 160's -> back to sinus  Diltizem gtt discontinued  Amiodarone d/c'ed in concern of drug induced thrombocytopenia  TSH 5.311  Free T4 normal  OK with AC for 6 -9 months only due to acute illness and no recurrence of afib. Switch to DOAC once more stable  Cardiology signed off 02/26/20  Stopped amiodarone 4/22  Dysphagia  Made NPO status due to altered mental status  cortrak placed under fluoro 02/26/20 -> pt pulled off same day  cortrak replaced 4/19  On TF @ 60cc  Repeat swallow assessment as more awake  SLP on board  Hypertension  Home meds:  losartan 100, amlodipine 5  On losartan 50 bid and amlodipine 10 and hydralazine 100 tid . SBP goal < 140  off Cleviprex now   BP Stable  . 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 Multiple foci throughout progressed from previous MRI d/t microangiopathy vs amyloid.  As per husband, pt has some anomia as baseline  BP goal < 140  Hyperlipidemia  Home meds:  lipitor 10  LDL 92  Statin held in setting of acute ICH  Consider continuation of statin at discharge  Hyperglycemia  On steroids - decreased steroids Q12h today the daily tomorrow then stop     HgbA1c 6.1  CBGs q4h  SSI 0-15 -? Change to resistenc  increase novolog 2u q4h->    Hypokalemia and Hypomagnesemia and Hypophosphatemia  Potassium 4.2     Magnesium 2.1     Phosphorus 2.8    Other Stroke Risk Factors  Advanced age  Former Cigarette smoker  Family hx stroke (mother, father, and multiple other family members)  Sedation  Stop all sedating meds - ativan, topamax. recently stopped Seroquel     Add amantadine 100 mg twice daily to help her be more alert      Add depakene 250 milligram 3 times daily to help with agitation as well as headaches  Other Active Problems  Hypothyroid on synthroid. TSH WNL - resume synthroid. Free T4 normal  Urinary retention, foley placed  Diarrhea with TF - on imodium PRN - now off  Agitation. Seroquel stopped d/t lowering PLTs.   Aortic Atherosclerosis (ICD10-I70.0)   Emphysema (ICD10-J43.9).   Anemia - ? Related to steroids-we will plan to taper and discontinue 03/09/20  Patient continues to be more alert and interactive  after starting amantadine and removing some of her medicines.  Recommend continue tapering dexamethasone and stop tomorrow  .  Mobilize out of bed.  Physical occupational therapy consults.  Speech therapy for swallow eval as well as cognition. May switch to rivaroxaban or Eliquis if patient is able to swallow and continue IV argatroban till then Discussed with patient, son and husband and answered questions.   This patient is critically ill and at significant risk of neurological worsening, death and care requires constant monitoring of vital signs, hemodynamics,respiratory and cardiac monitoring, extensive review of multiple databases, frequent neurological assessment, discussion with family, other specialists and medical decision making of high complexity.I have made any additions or clarifications directly to the above note.This critical care time does not reflect procedure time, or teaching time or supervisory time of PA/NP/Med Resident etc but could involve care discussion time.  I spent 30 minutes of neurocritical care time  in the care of  this patient.   Antony Contras, [facility] day # [place], MD  To contact Stroke Continuity provider, please refer to [email or web address] After hours, contact General Neurology

## 2020-03-08 NOTE — Progress Notes (Signed)
[place] for Argatroban Indication: HIT  Allergies  Allergen Reactions  . Erythromycin Itching and Other (See Comments)    Severe stomach pains, diarrhea  . Heparin     Likely HIT, SRA negative    Patient Measurements: Height: 5\' 2"  (157.5 cm) Weight: 75.6 kg (166 lb 10.7 oz) IBW/kg (Calculated) : 50.1  Vital Signs: Temp: 98 F (36.7 C) (04/28 1120) Temp Source: Axillary (04/28 1120) BP: 166/61 (04/28 1120) Pulse Rate: 93 (04/28 1120)  Labs: Recent Labs    03/06/20 0538 03/06/20 0538 03/06/20 1000 03/06/20 1254 03/07/20 0608 03/07/20 1640 03/08/20 0500 03/08/20 1115 03/08/20 1633  HGB 8.0*   < >  --   --  8.6*  --  8.8*  --   --   HCT 26.7*  --   --   --  28.5*  --  29.0*  --   --   PLT 108*  --   --   --  149*  --  188  --   --   APTT 46*   < > 51*   < > 50*   < > 44* 46* 52*  CREATININE  --   --  0.67  --  0.85  --  0.90  --   --    < > = values in this interval not displayed.    Estimated Creatinine Clearance: 53 mL/min (by C-G formula based on SCr of 0.9 mg/dL).  Assessment: 74 year old initially admitted 4/4 with acute IVH and thrombocythemia with PLTC of 990k. Patient was ultimately diagnosed with polythemia, now s/p leukophoresis, therapeutic phlebotomy, and initiation of hydroxyurea. On 4/13 - found to have B/L PE - decision was risk of AC was too great and IVC placed. CT scan 4/15 showed extensive thrombus within confluence of sinuses and sent to IR with mechanical thrombectomy x5.  SRA resulted negative, Dr. Jana Hakim ordered repeat sent to different lab - still with working diagnosis of HIT d/t high pre-test probability. Also need to look into other causes for new TCP - though platelets now improving to 149. Hg low but stable.  aPTT 52 sec on agatroban 0.27mcg/kg/min (goal 50-60 seconds). Currently using a dosing weight of 79.7 kg.  No reported bleeding. Per RN no issues with IV infusion.  Goal of Therapy:   aPTT 50 - 60 seconds Monitor platelets by anticoagulation protocol: Yes   Plan: Continue argatroban at 0.65 mcg/kg/min  Continue q12h aPTT checks Monitor daily CBC, s/sx bleeding  Alanda Slim, PharmD, [facility] Clinical Pharmacist Please see AMION for all Pharmacists' Contact Phone Numbers 03/08/2020, 5:28 PM

## 2020-03-08 NOTE — Progress Notes (Signed)
Physical Therapy Treatment Patient Details Name: [name] MRN: [id number] DOB: 1946/05/10 Today's Date: 03/08/2020    History of Present Illness 74 yo female presented with persistent HA, nausea and vomiting pt with stable R lateral ventricle IVH. Pt with acute bil PE 4/13, IVC filter placed 4/14. 4/15 head CT demonstrated deep cerebral thrombosis s/p IR for thrombectomy with expressive aphasia after. 4/16 MRI showed multiple cerebral and cerebellar infarcts, Rt temporal infarct with cytotoxic edema and hypertonic saline started.  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 PMHx: HTN, HLD, hep C, hypothyroidism    PT Comments    Pt progressing daily with mobility, ability to follow commands, sustained attention and awareness.  She seemed surprised today when I told her she had a stroke.  Bed mobility and transfers improve daily and I feel she is ready to try a RW and possibly short distance gait with a second person assisting.  She remains highly appropriate for CIR level therapies at discharge. PT will continue to follow acutely for safe mobility progression.   Follow Up Recommendations  CIR     Equipment Recommendations  Wheelchair (measurements PT);Wheelchair cushion (measurements PT);3in1 (PT);Hospital bed    Recommendations for Other Services   NA     Precautions / Restrictions Precautions Precautions: Fall Precaution Comments: SBP <140, expressive/receptive difficulties     Mobility  Bed Mobility Overal bed mobility: Needs Assistance Bed Mobility: Supine to Sit;Sit to Supine     Supine to sit: Supervision;HOB elevated Sit to supine: Min assist   General bed mobility comments: Close supervision and visual/verbal cues to come up to sitting EOB.  Pt using R railing to assist her trunk, moved legs over on her own. Min assist to lift one leg and then the other partially in the bed (pt could lift half way, but not all the way).    Transfers Overall transfer  level: Needs assistance Equipment used: 1 person hand held assist;2 person hand held assist Transfers: Sit to/from [facility] Sit to Stand: Mod assist;+2 physical assistance Stand pivot transfers: +2 physical assistance;Mod assist       General transfer comment: Stood EOB with bed rail and therapist's hand held assist x 5 min to mod.  Stand pivot to [facility] mod assist to get there and heavier mod assist to return to bed due to fatigue vs pt reporting back pain.    Ambulation/Gait             General Gait Details: I would like to attempt short distance gait tomorrow with second person assisting for safety and chair to follow.        Modified Rankin (Stroke Patients Only) Modified Rankin (Stroke Patients Only) Pre-Morbid Rankin Score: No symptoms Modified Rankin: Moderately severe disability     Balance Overall balance assessment: Needs assistance Sitting-balance support: Feet supported;Single extremity supported Sitting balance-Leahy Scale: Good Sitting balance - Comments: close supervision EOB.  At times leans on her right elbow almost like she is resting vs actually leaning.  Postural control: Right lateral lean Standing balance support: Bilateral upper extremity supported Standing balance-Leahy Scale: Poor Standing balance comment: needs support from PT and rail.                             Cognition Arousal/Alertness: Awake/alert Behavior During Therapy: Restless;Impulsive Overall Cognitive Status: Impaired/Different from baseline Area of Impairment: Orientation;Attention;Memory;Following commands;Safety/judgement;Awareness;Problem solving  Orientation Level: Disoriented to;Person;Place;Time;Situation Current Attention Level: Sustained Memory: Decreased recall of precautions;Decreased short-term memory Following Commands: Follows one step commands inconsistently;Follows one step commands with increased  time Safety/Judgement: Decreased awareness of deficits;Decreased awareness of safety Awareness: Intellectual Problem Solving: Difficulty sequencing;Requires verbal cues;Requires tactile cues General Comments: Pt restless in the bed, when told she had a stroke she was suprised, sons in room at end of session and she recognized them clearly, but could not name them (part of her aphasia likely), impulsive, unaware of her deficits.              Pertinent Vitals/Pain Pain Assessment: Faces Faces Pain Scale: Hurts even more Pain Location: behind with peri care and return to bed Pain Descriptors / Indicators: Grimacing;Moaning Pain Intervention(s): Limited activity within patient's tolerance;Monitored during session;Repositioned;Other (comment)(butt paste applied)           PT Goals (current goals can now be found in the care plan section) Acute Rehab PT Goals Patient Stated Goal: unable to state clearly, sons want her better and back at home Progress towards PT goals: Progressing toward goals    Frequency    Min 4X/week      PT Plan Current plan remains appropriate       AM-PAC PT "6 Clicks" Mobility   Outcome Measure  Help needed turning from your back to your side while in a flat bed without using bedrails?: A Lot Help needed moving from lying on your back to sitting on the side of a flat bed without using bedrails?: None Help needed moving to and from a bed to a chair (including a wheelchair)?: A Lot Help needed standing up from a chair using your arms (e.g., wheelchair or bedside chair)?: A Lot Help needed to walk in hospital room?: A Lot Help needed climbing 3-5 steps with a railing? : Total 6 Click Score: 13    End of Session   Activity Tolerance: Patient tolerated treatment well Patient left: in bed;with call bell/phone within reach;with bed alarm set;with nursing/sitter in room;with family/visitor present   PT Visit Diagnosis: Other abnormalities of gait and  mobility (R26.89);Other symptoms and signs involving the nervous system [id number])     Time: [id number] PT Time Calculation (min) (ACUTE ONLY): 44 min  Charges:  $Therapeutic Activity: 38-52 mins          [name], PT, DPT  Acute Rehabilitation [phone] pager #[phone] office              03/08/2020, 4:32 PM

## 2020-03-09 ENCOUNTER — Inpatient Hospital Stay (HOSPITAL_COMMUNITY): Payer: Medicare HMO

## 2020-03-09 DIAGNOSIS — I615 Nontraumatic intracerebral hemorrhage, intraventricular: Secondary | ICD-10-CM | POA: Diagnosis not present

## 2020-03-09 LAB — HEPATITIS C ANTIBODY: HCV Ab: NONREACTIVE

## 2020-03-09 LAB — BASIC METABOLIC PANEL
Anion gap: 9 (ref 5–15)
BUN: 30 mg/dL — ABNORMAL HIGH (ref 8–23)
CO2: 24 mmol/L (ref 22–32)
Calcium: 8.9 mg/dL (ref 8.9–10.3)
Chloride: 107 mmol/L (ref 98–111)
Creatinine, Ser: 0.83 mg/dL (ref 0.44–1.00)
GFR calc Af Amer: >60 mL/min (ref >60)
GFR calc non Af Amer: >60 mL/min (ref >60)
Glucose, Bld: 153 mg/dL — ABNORMAL HIGH (ref 70–99)
Potassium: 4 mmol/L (ref 3.5–5.1)
Sodium: 140 mmol/L (ref 135–145)

## 2020-03-09 LAB — CBC WITH DIFFERENTIAL/PLATELET
Abs Immature Granulocytes: 0.44 10*3/uL — ABNORMAL HIGH (ref 0.00–0.07)
Basophils Absolute: 0 10*3/uL (ref 0.0–0.1)
Basophils Relative: 0 %
Eosinophils Absolute: 0.1 10*3/uL (ref 0.0–0.5)
Eosinophils Relative: 0 %
HCT: 24.8 % — ABNORMAL LOW (ref 36.0–46.0)
Hemoglobin: 7.7 g/dL — ABNORMAL LOW (ref 12.0–15.0)
Immature Granulocytes: 3 %
Lymphocytes Relative: 5 %
Lymphs Abs: 0.6 10*3/uL — ABNORMAL LOW (ref 0.7–4.0)
MCH: 30.8 pg (ref 26.0–34.0)
MCHC: 31 g/dL (ref 30.0–36.0)
MCV: 99.2 fL (ref 80.0–100.0)
Monocytes Absolute: 0.6 10*3/uL (ref 0.1–1.0)
Monocytes Relative: 5 %
Neutro Abs: 11.4 10*3/uL — ABNORMAL HIGH (ref 1.7–7.7)
Neutrophils Relative %: 87 %
Platelets: 223 10*3/uL (ref 150–400)
RBC: 2.5 MIL/uL — ABNORMAL LOW (ref 3.87–5.11)
RDW: 20.5 % — ABNORMAL HIGH (ref 11.5–15.5)
WBC: 13.1 10*3/uL — ABNORMAL HIGH (ref 4.0–10.5)
nRBC: 0 % (ref 0.0–0.2)

## 2020-03-09 LAB — IRON AND TIBC
Iron: 53 ug/dL (ref 28–170)
Saturation Ratios: 23 % (ref 10.4–31.8)
TIBC: 231 ug/dL — ABNORMAL LOW (ref 250–450)
UIBC: 178 ug/dL

## 2020-03-09 LAB — RETICULOCYTES
Immature Retic Fract: 20.2 % — ABNORMAL HIGH (ref 2.3–15.9)
RBC.: 2.56 MIL/uL — ABNORMAL LOW (ref 3.87–5.11)
Retic Count, Absolute: 117.2 10*3/uL (ref 19.0–186.0)
Retic Ct Pct: 4.6 % — ABNORMAL HIGH (ref 0.4–3.1)

## 2020-03-09 LAB — GLUCOSE, CAPILLARY
Glucose-Capillary: 118 mg/dL — ABNORMAL HIGH (ref 70–99)
Glucose-Capillary: 140 mg/dL — ABNORMAL HIGH (ref 70–99)
Glucose-Capillary: 160 mg/dL — ABNORMAL HIGH (ref 70–99)
Glucose-Capillary: 160 mg/dL — ABNORMAL HIGH (ref 70–99)
Glucose-Capillary: 167 mg/dL — ABNORMAL HIGH (ref 70–99)
Glucose-Capillary: 204 mg/dL — ABNORMAL HIGH (ref 70–99)
Glucose-Capillary: 47 mg/dL — ABNORMAL LOW (ref 70–99)

## 2020-03-09 LAB — APTT
aPTT: 55 seconds — ABNORMAL HIGH (ref 24–36)
aPTT: 50 seconds — ABNORMAL HIGH (ref 24–36)

## 2020-03-09 LAB — FERRITIN: Ferritin: 228 ng/mL (ref 11–307)

## 2020-03-09 LAB — TRIGLYCERIDES: Triglycerides: 85 mg/dL (ref <150)

## 2020-03-09 LAB — FOLATE: Folate: 8.7 ng/mL (ref >5.9)

## 2020-03-09 MED ORDER — QUETIAPINE FUMARATE 50 MG PO TABS
25.0000 mg | ORAL_TABLET | Freq: Every evening | ORAL | Status: DC | PRN
  Administered 2020-03-09: 25 mg via ORAL
  Filled 2020-03-09: qty 1

## 2020-03-09 MED ORDER — DEXTROSE 50 % IV SOLN
INTRAVENOUS | Status: AC
  Administered 2020-03-09: 08:00:00 50 mL
  Filled 2020-03-09: qty 50

## 2020-03-09 MED ORDER — RESOURCE THICKENUP CLEAR PO POWD
ORAL | Status: DC | PRN
  Filled 2020-03-09 (×2): qty 125

## 2020-03-09 MED ORDER — DABIGATRAN ETEXILATE MESYLATE 75 MG PO CAPS: 75.0000 mg | ORAL_CAPSULE | Freq: Two times a day (BID) | ORAL | Status: DC

## 2020-03-09 NOTE — Progress Notes (Signed)
Inpatient Rehabilitation Admissions Coordinator  I spoke with spouse by phone and await insurance approval for CIR admit.  Danne Baxter, RN, MSN Rehab Admissions Coordinator [phone] 03/09/2020 12:33 PM

## 2020-03-09 NOTE — Progress Notes (Signed)
  Speech Language Pathology Treatment: Dysphagia  Patient Details Name: [name] MRN: [id number] DOB: Jun 24, 1946 Today's Date: 03/09/2020 Time: 0902-0917 SLP Time Calculation (min) (ACUTE ONLY): 15 min  Assessment / Plan / Recommendation Clinical Impression  Pt followed a few one-step commands today but continues to self-limit PO intake. What she did consume was mildly increased from last visit, but she cannot be challenged at bedside for an adequate assessment. She has intermittent coughing and oral holding across water and purees, as well as multiple swallows. Given signs of dysphagia and limited clinical assessment, will proceed straight to MBS. Family returned at the end of session and are in agreement with plan. MBS tentatively planned for later this morning.    HPI HPI: 74 y.o. female with history of HTN presenting with severe persistent HA accompanied by nausea and vomiting. Dx right lateral IVH. Developed acute PE; IVC filter placed 4/13. 4/15 head CT demonstrated deep cerebral thrombosis s/p IR for thrombectomy with expressive aphasia after. 4/16 MRI showed multiple cerebral and cerebellar infarcts, Rt temporal infarct with cytotoxic edema and hypertonic saline started.      SLP Plan  MBS       Recommendations  Diet recommendations: NPO;Other(comment)(ice chips after oral care) Medication Administration: Via alternative means                Oral Care Recommendations: Oral care QID Follow up Recommendations: Inpatient Rehab SLP Visit Diagnosis: Dysphagia, unspecified (R13.10) Plan: MBS       GO                 Osie Bond., M.A. [place] Acute Rehabilitation Services Pager [phone] Office [phone]  03/09/2020, 9:25 AM

## 2020-03-09 NOTE — PMR Pre-admission (Addendum)
PMR Admission Coordinator Pre-Admission Assessment  Patient: [name] is an 74 y.o., female MRN: [id number] DOB: January 10, 1946 Height: 5\' 2"  (157.5 cm) Weight: 75.6 kg              Insurance Information HMO:     PPO:     PCP:      IPA:      80/20:      OTHER:  PRIMARY: [employer] Medicare      Policy#: [id number]      Subscriber: pt CM Name: Manuela Schwartz     Phone#: [phone]     Fax#: [phone] Pre-Cert#: [id number]   Approved for 7 days when updates are due to tbd Employer:  Benefits:  Phone #: [phone]     Name: 4/29 Eff. Date: 11/12/2019     Deduct: none      Out of Pocket Max: $5000      Life Max: none  CIR: $295 co pay per day days 1 until 6      SNF: no copay days 1 until 20; $184 per day days 21 until 100 Outpatient: $35 per visit     Co-Pay: visits per medical neccesity Home Health: 100%      Co-Pay: visits per medical neccesity DME: 80%     Co-Pay: 20% Providers: in network  SECONDARY: none      Policy#:       Phone#:   Development worker, community:       Phone#:   [employer], [employer]" for patients in Inpatient Rehabilitation Facilities with attached "Privacy Act [place] Records" was provided and verbally reviewed with: Family  Emergency Contact Information Contact Information    Name Relation Home Work Mobile   Azeneth, Kost [phone]  [phone]     Current Medical History  Patient Admitting Diagnosis: IVH  History of Present Illness:  74 y.o. female with history of HTN who was admitted on 02/14/20 with severe persistent HA and N/V. CT head revealed acute IVH in right lateral ventricle with severe chronic ischemic microangiopathy. CBC revealed thrombocytosis with plt- 990, hgb 17.7 and WBC 12.0. Neurology questioned if bleed HTN v/s CAA related. Dr. Jana Hakim was consulted for input and recommended bone biopsy as well as leukopheresis for work up of panmyelosis felt to be due to polycythemia vera.  2D echo done revealing EF 65-70%  with no wall abnormality. She was placed on low dose cleviprex and was started on pheresis with improvement in cell counts. Bone biopsy positive for JAK2 confirming polycythemia vera-- did not meet criteria for von Willebrand disease with borderline low ristocetin cofactor -->to be repeated once patient in remission. She developed lethargy 4/5 with repeat CT head showed IVH without change. MRI/MRA brain ordered for work up and showed stable bleed and progression of extensive confluent hyperintensity thorough out the brain question due to microangiopathy v/s amyloid angiopathy. She completed leukopheresis X 3 with plans for d/c but had worsening of HA on 4/10. As hgb up to 17.3, she underwent phlebotomy of one unit and CTA head done showing patent vessels but suboptimal evaluation due to artifact. EEG done which showed moderate diffuse encephalopathy and was negative for seizures .  Depakote was added with some improvement in HA. She developed SOB with intermittent tachycardia and CTA chest done showing acute BLL emboli and emphysematous changes seen in BUL. BLE dopplers were negative for DVT. IVC filter placed by Dr. Anselm Pancoast on 4/13 due to [place]. She developed A fib with RVR with short runs  of NSVT on 04/14. Cardiology consulted for input and recommended IV metoprolol with question of cardioversion due to hypotension. She was transferred to ICU for monitoring and required IV amiodarone for rate recurrent flutter.  She developed increased in HA with drop in platelets to <24,00 and IV solumedrol added. Work up positive for HIT.  CT head repeated 4/15 showing hypodensity in straight sinus and follow up CT venogram done revealing extensive thrombus within confluence of sinuses and straight sinus extending into vein of Galen and fairly extensive multifocal thrombus within R>L transverse and sigmoid dural venous sinuses.  She underwent emergent thrombectomy with recanalization of deep system with decrease in clot burden.  Follow up MRI brain 4/16 showed numerous tiny acute infarcts in bilateral cerebral and cerebellar hemispheres felt to be embolic, 2.3 cm acute cortical infarct right temporal occipital lobes likely venous infarct, new region of parenchymal swelling left frontal lobe and frontal operculum and unchanged subacute IVH without hydrocephalus.  She was started on hypertonic saline for edema control and case discussed with Hem/Onc who recommended lower therapeutic range of Argotroban.  Mentation continues to fluctuate with bouts of agitation and lethargy. On tube feeds for nutritional supplement. Once she is taking pos normally, hemoc will d/c argatroban and start DOACS.   Neurology follow up on 4/29 and patient more alert and interactive after starting amantadine and removing some of her medica cations. Recommend taper her dexamethasone further.   Complete NIHSS TOTAL: 7 Glasgow Coma Scale Score: 14  Past Medical History  Past Medical History:  Diagnosis Date  . Hepatitis C   . Hyperlipidemia   . Hypertension   . Osteoporosis     Family History  family history includes Heart disease in her father and mother.  Prior Rehab/Hospitalizations:  Has the patient had prior rehab or hospitalizations prior to admission? Yes  Has the patient had major surgery during 100 days prior to admission? No  Current Medications   Current Facility-Administered Medications:  .   stroke: mapping our early stages of recovery book, , Does not apply, Once, Biby, Sharon L, NP .  0.9 %  sodium chloride infusion, , Intravenous, PRN, Burnetta Sabin L, NP, Last Rate: 8 mL/hr at 03/06/20 1100, Rate Change at 03/06/20 1100 .  acetaminophen (TYLENOL) tablet 650 mg, 650 mg, Oral, Q6H PRN, 325 mg at 02/24/20 2254 **OR** acetaminophen (TYLENOL) 160 MG/5ML solution 650 mg, 650 mg, Per Tube, Q6H PRN, 650 mg at 03/09/20 2040 **OR** acetaminophen (TYLENOL) suppository 650 mg, 650 mg, Rectal, Q6H PRN, Biby, Sharon L, NP .  amantadine  (SYMMETREL) 50 MG/5ML solution 100 mg, 100 mg, Per Tube, BID, Biby, Sharon L, NP, 100 mg at 03/10/20 [id number] .  amLODipine (NORVASC) tablet 10 mg, 10 mg, Per Tube, Daily, Biby, Sharon L, NP, 10 mg at 03/09/20 1006 .  argatroban 1 mg/mL infusion, 0.65 mcg/kg/min, Intravenous, Continuous, Garvin Fila, MD, Last Rate: 3.11 mL/hr at 03/08/20 1925, 0.65 mcg/kg/min at 03/08/20 1925 .  atorvastatin (LIPITOR) tablet 10 mg, 10 mg, Per Tube, Daily, Biby, Sharon L, NP, 10 mg at 03/10/20 [id number] .  chlorhexidine (PERIDEX) 0.12 % solution 15 mL, 15 mL, Mouth Rinse, BID, Biby, Sharon L, NP, 15 mL at 03/10/20 0924 .  Chlorhexidine Gluconate Cloth 2 % PADS 6 each, 6 each, Topical, Daily, Donzetta Starch, NP, 6 each at 03/10/20 [phone] .  [COMPLETED] dexamethasone (DECADRON) injection 10 mg, 10 mg, Intravenous, Once, 10 mg at 02/27/20 0925 **FOLLOWED BY** dexamethasone (DECADRON) injection 4 mg, 4 mg,  Intravenous, Q12H, Donzetta Starch, NP, 4 mg at 03/10/20 [id number] .  feeding supplement (GLUCERNA 1.2 CAL) liquid 1,000 mL, 1,000 mL, Per Tube, Continuous, Biby, Sharon L, NP, Last Rate: 60 mL/hr at 03/08/20 0631, 1,000 mL at 03/08/20 0631 .  feeding supplement (PRO-STAT SUGAR FREE 64) liquid 30 mL, 30 mL, Per Tube, Daily, Biby, Sharon L, NP, 30 mL at 03/10/20 0921 .  fluticasone furoate-vilanterol (BREO ELLIPTA) 200-25 MCG/INH 1 puff, 1 puff, Inhalation, Daily, Biby, Sharon L, NP, 1 puff at 03/10/20 0819 .  Gerhardt's butt cream, , Topical, PRN, Greta Doom, MD .  hydrALAZINE (APRESOLINE) tablet 100 mg, 100 mg, Per Tube, TID, Donzetta Starch, NP, 100 mg at 03/10/20 1602 .  insulin aspart (novoLOG) injection 0-20 Units, 0-20 Units, Subcutaneous, Q4H, Biby, Sharon L, NP, 4 Units at 03/10/20 1601 .  insulin aspart (novoLOG) injection 5 Units, 5 Units, Subcutaneous, Q4H, Biby, Sharon L, NP, 5 Units at 03/10/20 1601 .  labetalol (NORMODYNE) injection 10-20 mg, 10-20 mg, Intravenous, Q2H PRN, Biby, Sharon L, NP .  levothyroxine  (SYNTHROID) tablet 125 mcg, 125 mcg, Per Tube, QAC breakfast, Donzetta Starch, NP, 125 mcg at 03/10/20 0534 .  losartan (COZAAR) tablet 50 mg, 50 mg, Per Tube, BID, Biby, Sharon L, NP, 50 mg at 03/09/20 2130 .  MEDLINE mouth rinse, 15 mL, Mouth Rinse, q12n4p, Biby, Sharon L, NP, 15 mL at 03/10/20 1602 .  ondansetron (ZOFRAN) injection 4 mg, 4 mg, Intravenous, Q4H PRN, Biby, Sharon L, NP, 4 mg at 02/28/20 1617 .  potassium chloride 20 MEQ/15ML (10%) solution 40 mEq, 40 mEq, Per Tube, Daily, Biby, Sharon L, NP, 40 mEq at 03/10/20 [id number] .  QUEtiapine (SEROQUEL) tablet 25 mg, 25 mg, Oral, QHS PRN, Greta Doom, MD, 25 mg at 03/09/20 2042 .  Resource [employer], , Oral, PRN, Garvin Fila, MD .  senna-docusate (Senokot-S) tablet 1 tablet, 1 tablet, Per Tube, BID, Donzetta Starch, NP, 1 tablet at 03/09/20 2130 .  sodium chloride flush (NS) 0.9 % injection 10-40 mL, 10-40 mL, Intracatheter, Q12H, Biby, Sharon L, NP, 10 mL at 03/10/20 0924 .  sodium chloride flush (NS) 0.9 % injection 10-40 mL, 10-40 mL, Intracatheter, PRN, Biby, Sharon L, NP .  valproic acid (DEPAKENE) 250 MG/5ML solution 250 mg, 250 mg, Per Tube, BID, Biby, Sharon L, NP, 250 mg at 03/10/20 [id number]  Patients Current Diet:  Diet Order            DIET - DYS 1 Room service appropriate? Yes; Fluid consistency: Nectar Thick  Diet effective now              Precautions / Restrictions Precautions Precautions: Fall Precaution Comments: expressive/receptive difficulties Leans R Restrictions Weight Bearing Restrictions: No   Has the patient had 2 or more falls or a fall with injury in the past year?No  Prior Activity Level Community (5-7x/wk): Independent, active and driving  Prior Functional Level Prior Function Level of Independence: Independent Comments: pt drives, does everything for self PTA  Self Care: Did the patient need help bathing, dressing, using the toilet or eating?  Independent  Indoor Mobility: Did  the patient need assistance with walking from room to room (with or without device)? Independent  Stairs: Did the patient need assistance with internal or external stairs (with or without device)? Independent  Functional Cognition: Did the patient need help planning regular tasks such as shopping or remembering to take medications? [place] /  Equipment Home Assistive Devices/Equipment: Eyeglasses Home Equipment: None  Prior Device Use: Indicate devices/aids used by the patient prior to current illness, exacerbation or injury? None of the above  Current Functional Level Cognition  Overall Cognitive Status: Impaired/Different from baseline Difficult to assess due to: Impaired communication Current Attention Level: Sustained(short periods of sustained attention with structured environ) Orientation Level: Disoriented X4 Following Commands: Follows one step commands with increased time, Follows one step commands inconsistently Safety/Judgement: Decreased awareness of safety, Decreased awareness of deficits General Comments: Pt remains confused, unaware of her current situation, safety, picking at lines with mittens off.  Was able to preform peri care with sustained attention today.  Attention: Sustained Sustained Attention: Impaired Sustained Attention Impairment: Verbal complex Memory: Impaired Memory Impairment: Retrieval deficit(pt benefited from category cue for 3/5 items, multiple choice for 1/5 items and recalled one item without cue)    Extremity Assessment (includes Sensation/Coordination)  Upper Extremity Assessment: Difficult to assess due to impaired cognition LUE Deficits / Details: weakness noted but using functionally with decrease grasp LUE Coordination: decreased gross motor, decreased fine motor  Lower Extremity Assessment: Defer to PT evaluation LLE Deficits / Details: weakness    ADLs  Overall ADL's : Needs  assistance/impaired Eating/Feeding: Set up, Sitting Grooming: Total assistance, Brushing hair Grooming Details (indicate cue type and reason): limited d/t ideational apraxia Lower Body Bathing: Total assistance, Bed level Upper Body Dressing : Moderate assistance, Sitting Upper Body Dressing Details (indicate cue type and reason): to don gown as back side cover Lower Body Dressing: Total assistance, Bed level Lower Body Dressing Details (indicate cue type and reason): to don socks Toilet Transfer: Moderate assistance, +2 for physical assistance, Ambulation, RW Toilet Transfer Details (indicate cue type and reason): simulated via functional mobility; R lateral lean during ambulation with RW Toileting- Clothing Manipulation and Hygiene: Min guard Toileting - Clothing Manipulation Details (indicate cue type and reason): Safety Functional mobility during ADLs: Moderate assistance, +2 for physical assistance, +2 for safety/equipment, Rolling walker General ADL Comments: pt able to complete functional mobility this session with RW and MOD A +2 d/t R lateral lean. Pt continues to present with cognitive impairments, generalized weakness and decreased strength impacting ability to participate in ADLs    Mobility  Overal bed mobility: Needs Assistance Bed Mobility: Supine to Sit Rolling: Total assist, +2 for physical assistance Sidelying to sit: Min assist, HOB elevated(husband providing hand held assist) Supine to sit: Min assist, HOB elevated Sit to supine: Min assist, HOB elevated General bed mobility comments: Min hand held assist to come to sitting EOB, min assist of one leg to return to bed.      Transfers  Overall transfer level: Needs assistance Equipment used: Rolling walker (2 wheeled) Transfer via Lift Equipment: Stedy Transfers: Sit to/from Stand, Risk manager Sit to Stand: Min assist, Mod assist Stand pivot transfers: Min assist, Mod assist General transfer comment: Min  assist for first attempt to get to [facility], mod assist for back to bed once done on [facility], stood extra time for pericare. Pt also reporting low back hurts after being on BSC.     Ambulation / Gait / Stairs / Wheelchair Mobility  Ambulation/Gait Ambulation/Gait assistance: Mod assist, +2 physical assistance(third person following with chair. ) Gait Distance (Feet): 30 Feet Assistive device: Rolling walker (2 wheeled) Gait Pattern/deviations: Step-through pattern, Drifts right/left, Staggering right General Gait Details: Did not attempt with only one assist today as her husband and son were not there.   Gait velocity:  decreased Gait velocity interpretation: <1.8 ft/sec, indicate of risk for recurrent falls    Posture / Balance Dynamic Sitting Balance Sitting balance - Comments: close supervision EOB.  Balance Overall balance assessment: Needs assistance Sitting-balance support: Feet supported, Bilateral upper extremity supported Sitting balance-Leahy Scale: Fair Sitting balance - Comments: close supervision EOB.  Postural control: Right lateral lean Standing balance support: Bilateral upper extremity supported Standing balance-Leahy Scale: Poor Standing balance comment: needs external support from RW and therapist in standing.    Secial needs/care consideration  Cortrak 43 inches 10 Fr left nare 02/28/2020 Designated visitors are spouse, [name] and son,  [name]. I explained that second son , Gaspar Bidding not allowed to be third visitor Argatroban IV infusion Left basilic PICC 0000000 Right neck incsion   Previous Home Environment  Living Arrangements: Spouse/significant other  Lives With: Spouse Available Help at Discharge: Family, Available 24 hours/day Type of Home: House Home Layout: Two level, Able to live on main level with bedroom/bathroom Home Access: Stairs to enter Entrance Stairs-Rails: None Entrance Stairs-Number of Steps: 2 Bathroom Shower/Tub: Chiropodist:  [place]: No  Discharge Living Setting Plans for Discharge Living Setting: Patient's home, Lives with (comment)(spouse) Type of Home at Discharge: House Discharge Home Layout: Two level, Able to live on main level with bedroom/bathroom Alternate Level Stairs-Rails: None Discharge Home Access: Stairs to enter Entrance Stairs-Rails: None Entrance Stairs-Number of Steps: 2 Discharge Bathroom Shower/Tub: Tub/shower unit Discharge Bathroom Toilet: Standard Discharge Bathroom Accessibility: Yes How Accessible: Accessible via walker Does the patient have any problems obtaining your medications?: No  Social/Family/Support Systems Patient Roles: Spouse, Parent Contact Information: spouse, [name] Anticipated Caregiver: spouse Anticipated Ambulance person Information: see above Ability/Limitations of Caregiver: none Caregiver Availability: 24/7 Discharge Plan Discussed with Primary Caregiver: Yes Is Caregiver In Agreement with Plan?: Yes Does Caregiver/Family have Issues with Lodging/Transportation while Pt is in Rehab?: No  Goals Patient/Family Goal for Rehab: supervision to min assist assist with PT, OT, and SLP Expected length of stay: ELOS 20 to 24 days Pt/Family Agrees to Admission and willing to participate: Yes Program Orientation Provided & Reviewed with Pt/Caregiver Including Roles  & Responsibilities: Yes  Decrease burden of Care through IP rehab admission:n/a  Possible need for SNF placement upon discharge:not anticipated  Patient Condition: This patient's medical and functional status has changed since the consult dated: 02/28/2020 in which the Rehabilitation Physician determined and documented that the patient's condition is appropriate for intensive rehabilitative care in an inpatient rehabilitation facility. See "History of Present Illness" (above) for medical update. Functional changes are: overall mod assist. Patient's medical and functional status  update has been discussed with the Rehabilitation physician and patient remains appropriate for inpatient rehabilitation. Will admit to inpatient rehab today.  Preadmission Screen Completed By:  Cleatrice Burke, RN, 03/10/2020 4:59 PM ______________________________________________________________________   Discussed status with Dr. Posey Pronto on 03/10/2020 at  70 and received approval for admission today.  Admission Coordinator:  Cleatrice Burke, time [id number] Date 03/10/2020

## 2020-03-09 NOTE — Progress Notes (Signed)
Patient with significant agitation each evening requiring ativan. Will try adding seroquel 25mg  qhs prn.   Roland Rack, MD [facility] [phone]  If 7pm- 7am, please page neurology on call as listed in [place].

## 2020-03-09 NOTE — Progress Notes (Signed)
Results for CHENILLE, PARDE (MRN [id number]) as of 03/09/2020 10:38  Ref. Range 03/08/2020 20:09 03/09/2020 00:16 03/09/2020 04:03 03/09/2020 08:00 03/09/2020 08:33  Glucose-Capillary Latest Ref Range: 70 - 99 mg/dL 90 160 (H) 204 (H) 47 (L) 140 (H)  Noted that patient had low CBG this am of 47 mg/dl.   Recommend decreasing Novolog correction scale to MODERATE every 4 hours and decreasing Novolog tube feed coverage to 4 units every 4 hours if patient continues to have low blood sugars. Tube feed coverage should be held if tube feedings are stopped.   Harvel Ricks RN BSN CDE Diabetes Coordinator Pager: [phone]  8am-5pm

## 2020-03-09 NOTE — Progress Notes (Signed)
Hypoglycemic Event  CBG: 59  Treatment: 38mL D50  Symptoms: asymptomatic   Follow-up CBG: Time:0759 CBG Result:113  Possible Reasons for Event: unknown  Comments/MD notified:    [name]

## 2020-03-09 NOTE — Progress Notes (Signed)
[place] for Argatroban Indication: HIT  Allergies  Allergen Reactions  . Erythromycin Itching and Other (See Comments)    Severe stomach pains, diarrhea  . Heparin     Likely HIT, SRA negative    Patient Measurements: Height: 5\' 2"  (157.5 cm) Weight: 75.6 kg (166 lb 10.7 oz) IBW/kg (Calculated) : 50.1  Vital Signs: Temp: 98.5 F (36.9 C) (04/29 1609) Temp Source: Oral (04/29 1609) BP: 113/79 (04/29 1609) Pulse Rate: 94 (04/29 1609)  Labs: Recent Labs    03/07/20 0608 03/07/20 1640 03/08/20 0500 03/08/20 1115 03/08/20 1633 03/09/20 0500 03/09/20 1659  HGB 8.6*  --  8.8*  --   --  7.7*  --   HCT 28.5*  --  29.0*  --   --  24.8*  --   PLT 149*  --  188  --   --  223  --   APTT 50*   < > 44*   < > 52* 55* 50*  CREATININE 0.85  --  0.90  --   --  0.83  --    < > = values in this interval not displayed.    Estimated Creatinine Clearance: 57.5 mL/min (by C-G formula based on SCr of 0.83 mg/dL).  Assessment: 74 year old initially admitted 4/4 with acute IVH and thrombocythemia with PLTC of 990k. Patient was ultimately diagnosed with polythemia, now s/p leukophoresis, therapeutic phlebotomy, and initiation of hydroxyurea. On 4/13 - found to have B/L PE - decision was risk of AC was too great and IVC placed. CT scan 4/15 showed extensive thrombus within confluence of sinuses and sent to IR with mechanical thrombectomy x5.  SRA resulted negative, Dr. Jana Hakim ordered repeat sent to different lab - still with working diagnosis of HIT d/t high pre-test probability. Also need to look into other causes for new TCP - though platelets now improving to 149. Hg low but stable.  aPTT 50 sec on agatroban 0.36mcg/kg/min (goal 50-60 seconds). Currently using a dosing weight of 79.7 kg.  No reported bleeding even with drop in hemoglobin. Per RN no issues with IV infusion.  Goal of Therapy:  aPTT 50 - 60 seconds Monitor platelets by  anticoagulation protocol: Yes   Plan: Continue argatroban at 0.65 mcg/kg/min  Continue q12h aPTT checks Monitor daily CBC, s/sx bleeding  Sloan Leiter, PharmD, BCPS, BCCCP Clinical Pharmacist Please refer to [facility] for [place] numbers 03/09/2020 6:31 PM

## 2020-03-09 NOTE — Progress Notes (Addendum)
  Speech Language Pathology Treatment: Cognitive-Linquistic  Patient Details Name: [name] MRN: [id number] DOB: [birth year] Today's Date: 03/09/2020 Time: [id number] SLP Time Calculation (min) (ACUTE ONLY): 14 min  Assessment / Plan / Recommendation Clinical Impression  Speech tx was completed in radiology as we awaited for pt's identification band to be retrieved for testing to begin. Pt is very restless and distractible in this new environment, heavily distracted by her safety mits but also grabbing at all of her lines as soon as the mits are removed. Mod cues were provided for safety related to pulling on lines as well as not trying to get out of the radiology chair. When attentive to questions asked she did respond to a few appropriately (what's your husband's name, where did you grow up). Max cues are needed to sustain attention though, so most questions go unanswered. She often said phrases like, "gotta go" and "get me out of here." She will need SLP f/u for ongoing cognitive-linguistic therapy.    HPI HPI: 74 y.o. female with history of HTN presenting with severe persistent HA accompanied by nausea and vomiting. Dx right lateral IVH. Developed acute PE; IVC filter placed 4/13. 4/15 head CT demonstrated deep cerebral thrombosis s/p IR for thrombectomy with expressive aphasia after. 4/16 MRI showed multiple cerebral and cerebellar infarcts, Rt temporal infarct with cytotoxic edema and hypertonic saline started.      SLP Plan  Continue with current plan of care       Recommendations  Diet recommendations: NPO;Other(comment)(ice chips after oral care) Medication Administration: Via alternative means                Oral Care Recommendations: Oral care QID Follow up Recommendations: Inpatient Rehab SLP Visit Diagnosis: Aphasia (R47.01) Plan: Continue with current plan of care       GO                 Osie Bond., M.A. [place] [facility] Pager  [phone] Office [phone]  03/09/2020, 12:32 PM

## 2020-03-09 NOTE — Progress Notes (Signed)
Physical Therapy Treatment Patient Details Name: [name] MRN: [id number] DOB: February 18, 1946 Today's Date: 03/09/2020    History of Present Illness 74 yo female presented with persistent HA, nausea and vomiting pt with stable R lateral ventricle IVH. Pt with acute bil PE 4/13, IVC filter placed 4/14. 4/15 head CT demonstrated deep cerebral thrombosis s/p IR for thrombectomy with expressive aphasia after. 4/16 MRI showed multiple cerebral and cerebellar infarcts, Rt temporal infarct with cytotoxic edema and hypertonic saline started.  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 PMHx: HTN, HLD, hep C, hypothyroidism    PT Comments    Pt progressing every day with her gait and mobility.  She was able to walk a short distance into the hallway with two person mod assist, RW and chair to follow.  Son and husband motivating her throughout session and actively participating.  She remains highly appropriate for post acute multi disciplinary inpatient rehab at discharge.  PT will continue to follow acutely for safe mobility progression   Follow Up Recommendations  CIR     Equipment Recommendations  Rolling walker with 5" wheels;3in1 (PT);Wheelchair (measurements PT);Wheelchair cushion (measurements PT)    Recommendations for Other Services Rehab consult     Precautions / Restrictions Precautions Precautions: Fall Precaution Comments: expressive/receptive difficulties Leans R Restrictions Weight Bearing Restrictions: No    Mobility  Bed Mobility Overal bed mobility: Needs Assistance Bed Mobility: Supine to Sit   Sidelying to sit: Min assist;HOB elevated(husband providing hand held assist)       General bed mobility comments: Multi modal simple cues used to attempt to motivate pt to EOB, used husband's assist to do so successfully.  Husband provided min hand held assist to come to EOB from fully elevated bed.   Transfers Overall transfer level: Needs assistance Equipment  used: Rolling walker (2 wheeled) Transfers: Sit to/from Stand Sit to Stand: +2 physical assistance;Min assist         General transfer comment: Two person min assist to power up from mildly elevated bed.  Pt had both hands on RW so therapist's stabilized RW (I do not think she is ready for complex command of pushing up from bed).    Ambulation/Gait Ambulation/Gait assistance: Mod assist;+2 physical assistance(third person following with chair. ) Gait Distance (Feet): 30 Feet Assistive device: Rolling walker (2 wheeled) Gait Pattern/deviations: Step-through pattern;Drifts right/left;Staggering right Gait velocity: decreased Gait velocity interpretation: <1.8 ft/sec, indicate of risk for recurrent falls General Gait Details: Pt with right lateral lean and drift during gait needing mod assist for balance, RW steering and stability. Husband followed with chair and son encouraged her to walk from in front.         Modified Rankin (Stroke Patients Only) Modified Rankin (Stroke Patients Only) Pre-Morbid Rankin Score: No symptoms Modified Rankin: Moderately severe disability     Balance Overall balance assessment: Needs assistance Sitting-balance support: Feet supported;Bilateral upper extremity supported Sitting balance-Leahy Scale: Fair Sitting balance - Comments: close supervision EOB.  Did not attempt to challenge her today.  Postural control: Right lateral lean Standing balance support: Bilateral upper extremity supported Standing balance-Leahy Scale: Poor Standing balance comment: needs support from RW and therapists                            Cognition Arousal/Alertness: Awake/alert Behavior During Therapy: Restless Overall Cognitive Status: Impaired/Different from baseline Area of Impairment: Orientation;Attention;Memory;Following commands;Safety/judgement;Problem solving;Awareness  Orientation Level: Disoriented  to;Place;Time;Situation("[place]" "[employer]") Current Attention Level: Selective Memory: Decreased recall of precautions;Decreased short-term memory Following Commands: Follows one step commands with increased time;Follows one step commands inconsistently Safety/Judgement: Decreased awareness of safety;Decreased awareness of deficits Awareness: Intellectual Problem Solving: Decreased initiation;Difficulty sequencing;Requires verbal cues;Requires tactile cues General Comments: Multimodal cues used througout session, engaged husband and son as they are very motivating to her.  Very basic commands to encourage understanding.  4 people in room speaking to her at once at times, difficulty alternating, but can selectively focus on one person if we spoke one at a time.               Pertinent Vitals/Pain Pain Assessment: Faces Faces Pain Scale: No hurt           PT Goals (current goals can now be found in the care plan section) Acute Rehab PT Goals Patient Stated Goal: unable to state clearly, son and husband want her back home Progress towards PT goals: Progressing toward goals    Frequency    Min 4X/week      PT Plan Current plan remains appropriate    Co-evaluation PT/OT/SLP Co-Evaluation/Treatment: Yes Reason for Co-Treatment: Complexity of the patient's impairments (multi-system involvement);Necessary to address cognition/behavior during functional activity;For patient/therapist safety;To address functional/ADL transfers PT goals addressed during session: Mobility/safety with mobility;Balance;Proper use of DME        AM-PAC PT "6 Clicks" Mobility   Outcome Measure  Help needed turning from your back to your side while in a flat bed without using bedrails?: A Lot Help needed moving from lying on your back to sitting on the side of a flat bed without using bedrails?: A Little Help needed moving to and from a bed to a chair (including a wheelchair)?: A Little Help  needed standing up from a chair using your arms (e.g., wheelchair or bedside chair)?: A Little Help needed to walk in hospital room?: A Lot Help needed climbing 3-5 steps with a railing? : Total 6 Click Score: 14    End of Session Equipment Utilized During Treatment: Gait belt Activity Tolerance: Patient limited by fatigue Patient left: in chair;with call bell/phone within reach;with chair alarm set Nurse Communication: Mobility status;Other (comment)(re-try chair today) PT Visit Diagnosis: Other abnormalities of gait and mobility (R26.89);Other symptoms and signs involving the nervous system [id number])     Time: [id number] PT Time Calculation (min) (ACUTE ONLY): 29 min  Charges:  $Gait Training: 8-22 mins                    Verdene Lennert, PT, DPT  Acute Rehabilitation [phone] pager #[phone] office     03/09/2020, 10:07 AM

## 2020-03-09 NOTE — Progress Notes (Signed)
Modified Barium Swallow Progress Note  Patient Details  Name: [name] MRN: [id number] Date of Birth: 1946/02/13  Today's Date: 03/09/2020  Modified Barium Swallow completed.  Full report located under Chart Review in the Imaging Section.  Brief recommendations include the following:  Clinical Impression  Pt's oropharyngeal dysphagia appears to be largely cognitive in nature, although testing was also very limited as she began to refuse all POs after only a few trials. She has mildly slow posterior propulsion with purees, leaving minimal lingual residue on her tongue. Pharyngeally she has no overt weakness, but she did aspirate thin liquids in the setting of premature spillage from her oral cavity before the swallow. This elicited a strong cough response, but visibility below her vocal cords was limited and therefore unable to tell if aspirates were cleared. She had a delayed swallow with nectar thick liquids, suspect in part due to small bolus size but more so because pt was so heavily distracted in that moment. Recommend starting a trial of Dys 1 (puree) diet and nectar thick liquids, with full supervision being important given her mentation and the limited results from this study. Given her repeated pattern of refusing POs, I think it may be challenging to meet her nutritional needs strictly PO until we see more cognitive-linguistic improvements. Will continue to follow.   Swallow Evaluation Recommendations       SLP Diet Recommendations: Dysphagia 1 (Puree) solids;Nectar thick liquid   Liquid Administration via: Cup;Straw   Medication Administration: Crushed with puree   Supervision: Staff to assist with self feeding;Full supervision/cueing for compensatory strategies   Compensations: Slow rate;Small sips/bites   Postural Changes: Seated upright at 90 degrees   Oral Care Recommendations: Oral care BID   Other Recommendations: Order thickener from pharmacy;Prohibited food  (jello, ice cream, thin soups);Remove water pitcher;Have oral suction available     Osie Bond., M.A. [place] Pager [phone] Office [phone]  03/09/2020,12:56 PM

## 2020-03-09 NOTE — Progress Notes (Signed)
[place] for Argatroban Indication: HIT  Allergies  Allergen Reactions  . Erythromycin Itching and Other (See Comments)    Severe stomach pains, diarrhea  . Heparin     Likely HIT, SRA negative    Patient Measurements: Height: 5\' 2"  (157.5 cm) Weight: 75.6 kg (166 lb 10.7 oz) IBW/kg (Calculated) : 50.1  Vital Signs: Temp: 97.6 F (36.4 C) (04/29 0805) Temp Source: Axillary (04/29 0805) BP: 113/69 (04/29 0805) Pulse Rate: 95 (04/29 0805)  Labs: Recent Labs    03/07/20 0608 03/07/20 1640 03/08/20 0500 03/08/20 0500 03/08/20 1115 03/08/20 1633 03/09/20 0500  HGB 8.6*  --  8.8*  --   --   --  7.7*  HCT 28.5*  --  29.0*  --   --   --  24.8*  PLT 149*  --  188  --   --   --  223  APTT 50*   < > 44*   < > 46* 52* 55*  CREATININE 0.85  --  0.90  --   --   --  0.83   < > = values in this interval not displayed.    Estimated Creatinine Clearance: 57.5 mL/min (by C-G formula based on SCr of 0.83 mg/dL).  Assessment: 74 year old initially admitted 4/4 with acute IVH and thrombocythemia with PLTC of 990k. Patient was ultimately diagnosed with polythemia, now s/p leukophoresis, therapeutic phlebotomy, and initiation of hydroxyurea. On 4/13 - found to have B/L PE - decision was risk of AC was too great and IVC placed. CT scan 4/15 showed extensive thrombus within confluence of sinuses and sent to IR with mechanical thrombectomy x5.  SRA resulted negative, Dr. Jana Hakim ordered repeat sent to different lab - still with working diagnosis of HIT d/t high pre-test probability. Also need to look into other causes for new TCP - though platelets now improving to 149. Hg low but stable.  aPTT 55 sec on agatroban 0.13mcg/kg/min (goal 50-60 seconds). Currently using a dosing weight of 79.7 kg.  No reported bleeding even with drop in hemoglobin. Per RN no issues with IV infusion.  Goal of Therapy:  aPTT 50 - 60 seconds Monitor platelets by  anticoagulation protocol: Yes   Plan: Continue argatroban at 0.65 mcg/kg/min  Continue q12h aPTT checks Monitor daily CBC, s/sx bleeding  Barth Kirks, PharmD, BCPS, BCCCP Clinical Pharmacist [phone]  Please check AMION for all [place] numbers  03/09/2020 8:21 AM

## 2020-03-09 NOTE — Progress Notes (Signed)
[name]   DOB:[birth year]   ZO#:[id number]   JWJ#:[id number]  Subjective:  [name] is alert, sitting up in bed, trying to remove her hand restraints. She is pleasant, holds my gaze, and speaks clearly. "This is crazy." "I'm glad to hear it." I asked her to tell me the name of her husband who was standing there-- she was puzzled and did not answer. She was not following commands even with modelling ("raide your right hand"). Husband in room  Objective: white woman examined in bed, restraints in place, ng in place Vitals:   03/09/20 0014 03/09/20 0401  BP:    Pulse:    Resp:    Temp: 98.3 F (36.8 C) 98.4 F (36.9 C)  SpO2:      Body mass index is 30.48 kg/m.  Intake/Output Summary (Last 24 hours) at 03/09/2020 0801 Last data filed at 03/08/2020 2148 Gross per 24 hour  Intake 512.1 ml  Output --  Net 512.1 ml   Lungs: no rhonchi or wheezes, auscultated anterolaterally Hart: RRR, no murmur appreciated Abd soft, +BS Neuro: alert but disoriented x3, not following commands, good social responses (smiles pleasantly, gives vague answers)   Ref Range & Units 7 d ago 2 wk ago  Heparin Induced Plt Ab 0.000 - 0.400 OD 1.469High   2.553High  CM        CBG (last 3)  Recent Labs    03/08/20 2009 03/09/20 0016 03/09/20 0403  GLUCAP 90 160* 204*     Labs:  Lab Results  Component Value Date   WBC 13.1 (H) 03/09/2020   HGB 7.7 (L) 03/09/2020   HCT 24.8 (L) 03/09/2020   MCV 99.2 03/09/2020   PLT 223 03/09/2020   NEUTROABS 11.4 (H) 03/09/2020    '@LASTCHEMISTRY' @  Urine Studies No results for input(s): UHGB, CRYS in the last 72 hours.  Invalid input(s): UACOL, UAPR, USPG, UPH, UTP, UGL, UKET, UBIL, UNIT, UROB, ULEU, UEPI, UWBC, URBC, UBAC, CAST, UCOM, [place]  Basic Metabolic Panel: Recent Labs  Lab 03/05/20 0357 03/05/20 0357 03/05/20 1215 03/06/20 0115 03/06/20 1000 03/06/20 1000 03/07/20 0608 03/07/20 0608 03/08/20 0500 03/09/20 0500  NA 152*  152*  --    <  > 148* 147*  --  146*  --  142 140  K 4.2   < >  --   --  3.8   < > 4.1   < > 3.9 4.0  CL 123*  --   --   --  117*  --  114*  --  108 107  CO2 21*  --   --   --  23  --  25  --  23 24  GLUCOSE 224*  --   --   --  207*  --  107*  --  77 153*  BUN 38*  --   --   --  31*  --  31*  --  32* 30*  CREATININE 0.75  --   --   --  0.67  --  0.85  --  0.90 0.83  CALCIUM 9.4  --   --   --  9.1  --  9.4  --  9.4 8.9   < > = values in this interval not displayed.   GFR Estimated Creatinine Clearance: 57.5 mL/min (by C-G formula based on SCr of 0.83 mg/dL). Liver Function Tests: No results for input(s): AST, ALT, ALKPHOS, BILITOT, PROT, ALBUMIN in the last 168 hours. No results for input(s): LIPASE, AMYLASE  in the last 168 hours. No results for input(s): AMMONIA in the last 168 hours. Coagulation profile No results for input(s): INR, PROTIME in the last 168 hours.  CBC: Recent Labs  Lab 03/05/20 0357 03/06/20 0538 03/07/20 0608 03/08/20 0500 03/09/20 0500  WBC 14.9* 12.2* 13.3* 14.0* 13.1*  NEUTROABS 12.9* 10.4* 10.5* 11.3* 11.4*  HGB 8.3* 8.0* 8.6* 8.8* 7.7*  HCT 27.9* 26.7* 28.5* 29.0* 24.8*  MCV 101.5* 101.5* 101.8* 102.5* 99.2  PLT 85* 108* 149* 188 223   Cardiac Enzymes: No results for input(s): CKTOTAL, CKMB, CKMBINDEX, TROPONINI in the last 168 hours. BNP: Invalid input(s): POCBNP CBG: Recent Labs  Lab 03/08/20 1123 03/08/20 1618 03/08/20 2009 03/09/20 0016 03/09/20 0403  GLUCAP 149* 159* 90 160* 204*   D-Dimer No results for input(s): DDIMER in the last 72 hours. Hgb A1c No results for input(s): HGBA1C in the last 72 hours. Lipid Profile Recent Labs    03/09/20 0500  TRIG 85   Thyroid function studies No results for input(s): TSH, T4TOTAL, T3FREE, THYROIDAB in the last 72 hours.  Invalid input(s): FREET3 Anemia work up Recent Labs    03/08/20 1854 03/09/20 0500  VITAMINB12 298  --   FOLATE  --  8.7  FERRITIN  --  228  TIBC  --  231*  IRON  --  53   RETICCTPCT  --  4.6*   Microbiology No results found for this or any previous visit (from the past 240 hour(s)).    Studies:  No results found.  Assessment: 74 y.o. [name] woman presenting 02/13/2020 with headache, found to have biventricular bleeds in the setting of panmyelosis, subsequently with pulmonary emboli and sinus venous thromboses in the setting of heparin induced thrombocytopenia  (1) polycythemia vera  (a) JAK2 mutation positive confirming diagnosis  (b) bone marrow biopsy 02/13/2020 shows no evidence of leukemia, c/w P Vera  (c)  leukapheresis x3 . Last leukapheresis Friday 02/18/2020   (i) HD catheter removed after pheresis on  02/18/2020  (d) hydrea as needed to keep HCT <45 and platelets in normal range   (2) heparin induced thrombocytopenia: positive HIT screen x2  (a) severe thrombocythemia noted 02/24/2020  (b) bilateral pulmonary emboli 02/22/2020   (i) s/p IVC filter placement 02/22/2020   (ii) atrial fibrillation with RVR  (c) venous sinus thromboses 02/24/2020   (i) s/p mechanical thrombectomy  (d) argatroban started 02/25/2020  (e) platelet count responded to heparin withdrawal  (f) confirmatory serotonin release assay negative (see discussion below)   (i) continue argatroban for anticoagulation, no heparin   (ii) repeat H[facility] screen at [facility] lab again strongly positive  (g) platelets normalized as of 03/08/2020  (3) drug-induced thrombocytopenia  (a) discontinued amiodarone, inessential meds   (i) remains in sinus  (b) resolved-- platelets now WNL  (4) anemia: reticulocytes increasing; no folate, B-12 or iron deficiency; normal renal function; will recheck LDH, t bil & follow  Plan:  [name] is waking up nicely. She is alert but disoriented and not following commands-- this may delay her swallowing eval and Rehab transfer. Once she is taking po's normally will d/c argatroban and start DOACs. Plan to follow through transfer to Rehab and as  outpatient  Husband questioned Hep C diagnosis. I am repeating labs in AM to clear issue.  Hb lower today. Occult bleeding is a possibility in this anticoagulated patient; doubt hemolysis but will rule out; no nutritional deficiencies or CKD to explain anemia. Retics are up and that is encouraging.  Husband had many questions re her brain findings, quite aside from the bleeding/clotting issues; neuro to address PRN  Will follow with you   Chauncey Cruel, MD 03/09/2020  8:01 AM Medical Oncology and Hematology [facility] [place], [place] [address] Tel. [phone]    Fax. [phone]

## 2020-03-09 NOTE — Progress Notes (Signed)
STROKE TEAM PROGRESS NOTE   INTERVAL HISTORY Patient  is sitting up in bed.  She remains alert and interactive.  She is able to speak short sentences but remains confused and disoriented and   receptive and expressively aphasic.  She follows simple midline and one-step commands and moves all 4 extremities against gravity.     Vital signs are stable.  Platelet counts are now up to 223000.  And hematocrit is lower at 24.8.  Her husband is at the bedside.  Marland Kitchen Headache improved but present Vitals:   03/09/20 0014 03/09/20 0401 03/09/20 0805 03/09/20 0815  BP:   113/69   Pulse:   95   Resp:   18   Temp: 98.3 F (36.8 C) 98.4 F (36.9 C) 97.6 F (36.4 C)   TempSrc: Oral Oral Axillary   SpO2:   99% 98%  Weight:      Height:       CBC:  Recent Labs  Lab 03/08/20 0500 03/09/20 0500  WBC 14.0* 13.1*  NEUTROABS 11.3* 11.4*  HGB 8.8* 7.7*  HCT 29.0* 24.8*  MCV 102.5* 99.2  PLT 188 [id number]   Basic Metabolic Panel:  Recent Labs  Lab 03/08/20 0500 03/09/20 0500  NA 142 140  K 3.9 4.0  CL 108 107  CO2 23 24  GLUCOSE 77 153*  BUN 32* 30*  CREATININE 0.90 0.83  CALCIUM 9.4 8.9    IMAGING past 24 hours DG Swallowing Func-Speech Pathology  Result Date: 03/09/2020 Objective Swallowing Evaluation: Type of Study: MBS-Modified Barium Swallow Study  Patient Details Name: [name] MRN: [id number] Date of Birth: [birth year] Today's Date: 03/09/2020 Time: SLP Start Time (ACUTE ONLY): 1102 -SLP Stop Time (ACUTE ONLY): 1116 SLP Time Calculation (min) (ACUTE ONLY): 14 min Past Medical History: Past Medical History: Diagnosis Date . Hepatitis C  . Hyperlipidemia  . Hypertension  . Osteoporosis  Past Surgical History: Past Surgical History: Procedure Laterality Date . IR ANGIO INTRA EXTRACRAN SEL INTERNAL CAROTID BILAT MOD SED  02/24/2020 . IR ANGIO VERTEBRAL SEL VERTEBRAL UNI R MOD SED  02/24/2020 . IR ANGIOGRAM SELECTIVE EACH ADDITIONAL VESSEL  02/24/2020 . IR IVC FILTER PLMT / S&I /IMG GUID/MOD SED   02/22/2020 . IR THROMBECT VENO MECH MOD SED  02/24/2020 . IR US GUIDE VASC ACCESS RIGHT  02/24/2020 . IR US GUIDE VASC ACCESS RIGHT  02/24/2020 . IR VENO SAGITTAL SINUS  02/24/2020 . IR VENO/JUGULAR RIGHT  02/24/2020 . RADIOLOGY WITH ANESTHESIA N/A 02/24/2020  Procedure: IR WITH ANESTHESIA;  Surgeon: Radiologist, Medication, MD;  Location: [facility];  Service: Radiology;  Laterality: N/A; HPI: 74 y.o. female with history of HTN presenting with severe persistent HA accompanied by nausea and vomiting. Dx right lateral IVH. Developed acute PE; IVC filter placed 4/13. 4/15 head CT demonstrated deep cerebral thrombosis s/p IR for thrombectomy with expressive aphasia after. 4/16 MRI showed multiple cerebral and cerebellar infarcts, Rt temporal infarct with cytotoxic edema and hypertonic saline started.  Subjective: alert, restless Assessment / Plan / Recommendation CHL IP CLINICAL IMPRESSIONS 03/09/2020 Clinical Impression Pt's oropharyngeal dysphagia appears to be largely cognitive in nature, although testing was also very limited as she began to refuse all POs after only a few trials. She has mildly slow posterior propulsion with purees, leaving minimal lingual residue on her tongue. Pharyngeally she has no overt weakness, but she did aspirate thin liquids in the setting of premature spillage from her oral cavity before the swallow. This elicited a strong cough response, but  visibility below her vocal cords was limited and therefore unable to tell if aspirates were cleared. She had a delayed swallow with nectar thick liquids, suspect in part due to small bolus size but more so because pt was so heavily distracted in that moment. Recommend starting a trial of Dys 1 (puree) diet and nectar thick liquids, with full supervision being important given her mentation and the limited results from this study. Given her repeated pattern of refusing POs, I think it may be challenging to meet her nutritional needs strictly PO until we see more  cognitive-linguistic improvements. Will continue to follow. SLP Visit Diagnosis Dysphagia, oropharyngeal phase (R13.12) Attention and concentration deficit following -- Frontal lobe and executive function deficit following -- Impact on safety and function Moderate aspiration risk;Risk for inadequate nutrition/hydration   CHL IP TREATMENT RECOMMENDATION 03/09/2020 Treatment Recommendations Therapy as outlined in treatment plan below   Prognosis 03/09/2020 Prognosis for Safe Diet Advancement Fair Barriers to Reach Goals Cognitive deficits;Severity of deficits Barriers/Prognosis Comment -- CHL IP DIET RECOMMENDATION 03/09/2020 SLP Diet Recommendations Dysphagia 1 (Puree) solids;Nectar thick liquid Liquid Administration via Cup;Straw Medication Administration Crushed with puree Compensations Slow rate;Small sips/bites Postural Changes Seated upright at 90 degrees   CHL IP OTHER RECOMMENDATIONS 03/09/2020 Recommended Consults -- Oral Care Recommendations Oral care BID Other Recommendations Order thickener from pharmacy;Prohibited food (jello, ice cream, thin soups);Remove water pitcher;Have oral suction available   CHL IP FOLLOW UP RECOMMENDATIONS 03/09/2020 Follow up Recommendations Inpatient Rehab   CHL IP FREQUENCY AND DURATION 03/09/2020 Speech Therapy Frequency (ACUTE ONLY) min 2x/week Treatment Duration 2 weeks      CHL IP ORAL PHASE 03/09/2020 Oral Phase Impaired Oral - Pudding Teaspoon -- Oral - Pudding Cup -- Oral - Honey Teaspoon -- Oral - Honey Cup -- Oral - Nectar Teaspoon -- Oral - Nectar Cup -- Oral - Nectar Straw Premature spillage Oral - Thin Teaspoon -- Oral - Thin Cup -- Oral - Thin Straw Premature spillage Oral - Puree Premature spillage;Delayed oral transit Oral - Mech Soft -- Oral - Regular -- Oral - Multi-Consistency -- Oral - Pill -- Oral Phase - Comment --  CHL IP PHARYNGEAL PHASE 03/09/2020 Pharyngeal Phase Impaired Pharyngeal- Pudding Teaspoon -- Pharyngeal -- Pharyngeal- Pudding Cup -- Pharyngeal --  Pharyngeal- Honey Teaspoon -- Pharyngeal -- Pharyngeal- Honey Cup -- Pharyngeal -- Pharyngeal- Nectar Teaspoon -- Pharyngeal -- Pharyngeal- Nectar Cup -- Pharyngeal -- Pharyngeal- Nectar Straw Delayed swallow initiation-pyriform sinuses Pharyngeal -- Pharyngeal- Thin Teaspoon -- Pharyngeal -- Pharyngeal- Thin Cup -- Pharyngeal -- Pharyngeal- Thin Straw Penetration/Aspiration before swallow Pharyngeal Material enters airway, passes BELOW cords and not ejected out despite cough attempt by patient;Material enters airway, passes BELOW cords then ejected out Pharyngeal- Puree WFL Pharyngeal -- Pharyngeal- Mechanical Soft -- Pharyngeal -- Pharyngeal- Regular -- Pharyngeal -- Pharyngeal- Multi-consistency -- Pharyngeal -- Pharyngeal- Pill -- Pharyngeal -- Pharyngeal Comment --  CHL IP CERVICAL ESOPHAGEAL PHASE 03/09/2020 Cervical Esophageal Phase WFL Pudding Teaspoon -- Pudding Cup -- Honey Teaspoon -- Honey Cup -- Nectar Teaspoon -- Nectar Cup -- Nectar Straw -- Thin Teaspoon -- Thin Cup -- Thin Straw -- Puree -- Mechanical Soft -- Regular -- Multi-consistency -- Pill -- Cervical Esophageal Comment -- Osie Bond., M.A. [facility] Pager [phone] Office [phone] 03/09/2020, 12:57 PM                PHYSICAL EXAM      General - Well nourished, well developed elderly Caucasian lady, restless and agitated with exam  Ophthalmologic -  fundi not visualized due to noncooperation.She resists eye opening.   Cardiovascular - Regular rhythm and rate, not in afib.  Neuro -patient is awake and alert today.  She has mild dysarthria but can be clearly understood and speaks short sentences.  She has mixed aphasia with some word finding difficulties and nonfluent speech.  She can follow only simple midline and one-step commands.  She has diminished attention registration and recall.  She is disoriented.  She cannot name her son she blinks to threat more on the left than on the right.  No facial  weakness.  Tongue midline.  She moves all 4 extremities well against gravity without focal weakness.  . Sensation, coordination not cooperative and gait not tested.   ASSESSMENT/PLAN [name] is a 74 y.o. female with history of HTN presenting with severe persistent HA accompanied by nausea and vomiting. This has now improved.    1.  Stroke: multiple punctate B anterior and posterior circulation embolic infarcts in setting of PAF and Polycythemia Vera with clotting issues 2.  IVH - right lateral ventricle IVH secondary to ? CAA vs. HTN  CT head 4/5 0032 R lateral IVH. Severe chronic ischemic microangiopathy.  MRI 4/5 Stable IVH, severe leukoaraiosis. Multiple foci throughout progressed from previous MRI d/t microangiopathy vs amyloid.  MRA unremarkable   CT head repeat 4/5-4/10 - unchanged size and configuration of right IVH  CT Head 02/26/20 - Increased edema in the left greater than right temporal and occipital lobes which may reflect worsening edema from venous hypertension and venous infarcts.   CT head 02/28/20 decreased L>R edema. IVH same. Known SVT.    MRI 4/20 - left temporal and parietal significant petechial hemorrhage, likely due to significant CVST. Numerous punctate infarcts throughout brain. Numerous chronic microhemorrhages c/w CAA.  CT head 4/22 stable L temporal and occipital petechial hemorrhage. Stable IVH. Mild intracranial mass effect no midline shift. CSVT stable w/ edema.  CT Chest w contrast - 03/03/20 - Recent pulmonary embolus with development of pulmonary infarct in the right lower lobe. Nodular opacities in the right lower lobe have otherwise improved, likely improving infection.  CT Abdomen and Pelvis w contrast - 03/03/20 - Generalized body wall edema, with mild retroperitoneal edema, nonspecific. No evidence of other acute finding in the abdomen or pelvis.  Colonic diverticulosis without diverticulitis.   EEG - moderate diffuse encephalopathy  2D  Echo EF 65-70%. No source of embolus   LDL 92   HgbA1c 6.1   DVT prophylaxis - argatroban IV   aspirin 81 mg daily prior to admission, now on argatroban due to HIT -:Plan AC for at least 6 mos. 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 Dr. Ron Agee reaching out to Dr. Gwenlyn Found to discuss stopping amiodarone   LDH 427 PLT ct c/w citrate adamts 13 activity pending HIT ab 2.553 -> 1.469 reticulocytes 76.4  Hematology on board   HIT syndrome  B/l PE cerebral venous sinus thrombosis s/p IR ?  Drug induced thrombocytopenia  4/13 New SOB and tachycardia  CTA chest - BLE PE.   Filter placement on 4/13 as unable to get [facility] d/t IVH  4/15 CT head - straight sinus and vein of Galan CSVT  CTV Confirmed extensive thrombus within the confluence of sinuses and straight sinus, also extending into the vein of Galen. There is also fairly extensive multifocal thrombus within the right greater than left transverse and sigmoid dural venous sinuses.  S/p mechanical thrombectomy 02/24/20 of occluded deep cerebral veins  MRI 02/25/2020 shows multiple by cerebral and cerebellar infarcts as well as right temporal cortical venous infarct and cytotoxic edema  MRV persistent occlusion of 3 sinus, vein of Galan, right IJ, right basal vein of Rosenthal  CT head 4/17 increase edema in the left greater than right temporal and occipital lobes  CT head 4/19 decreased L>R edema. IVH same. Known SVT.  MRV 4/20 - dural venous sinus  unchanged.   MRI 4/20 - left temporal and parietal significant petechial hemorrhage, likely due to significant CVST.   CT head 4/22 stable L temporal and occipital petechial hemorrhage. Stable IVH. Mild intracranial mass effect no midline shift. DVT stable w/ edema.  HIT antibody positive  On argatroban IV -> continue, may switch to DOAC later. AC at least 6 months  Decadron 10mg  x 1 -> 4mg  q6  Off 3% saline   Na Q 6 hrs -?d/c  Na goal 150 - 155  Na 150     Repeat HIT ab pending   D/c potential offending meds - amiodraone, oxycodone, fioricet, imodium, seroquel  PLT 990->...->85   [id number]  CT chest, abd/pelvis to rue out any possible pathology, was negative  CT Chest w contrast - 03/03/20 - Recent pulmonary embolus with development of pulmonary infarct in the right lower lobe. Nodular opacities in the right lower lobe have otherwise improved, likely improving infection.  CT Abdomen and Pelvis w contrast - 03/03/20 - Generalized body wall edema, with mild retroperitoneal edema, nonspecific. No evidence of other acute finding in the abdomen or pelvis.  Colonic diverticulosis without diverticulitis.   Q6h Na checks -changed to daily now  Headache, due to IVH and CSVT  Depakote 500 mg nightly discontinued  Pt now has Fioricet one Q8 hrs prn as well as tylenol prn  Solumedrol 500 mg -> prednisone -> off  NS at 100 cc's / hr started 4/10 -> 3% saline  Add topamax 25 bid  Decadron started 10 mg IV x 1 then 4 mg IV Q 6 hrs (started 02/27/20) tapered and will discontinue 03/09/2020  CT head 4/17 increase edema in the left greater than right temporal and occipital lobes  CT head 4/19 decreased L>R edema. IVH same. Known SVT.  MRI and MRV4/20 dural venous sinus unchanged. left temporal and parietal significant petechial hemorrhage, likely due to significant CVST.   CT head 4/22  stable L temporal and occipital petechial hemorrhage. Stable IVH. Mild intracranial mass effect no  midline shift. CSVT stable w/ edema.  Continue steroids until swelling decreasing  Afib RVR, new diagnosis  Rate in 160's -> back to sinus  Diltizem gtt discontinued  Amiodarone d/c'ed in concern of drug induced thrombocytopenia  TSH 5.311  Free T4 normal  OK with AC for 6 -9 months only due to acute illness and no recurrence of afib. Switch to DOAC once more stable  Cardiology signed off 02/26/20  Stopped amiodarone 4/22  Dysphagia  Made NPO status due to altered mental status  cortrak placed under fluoro 02/26/20 -> pt pulled off same day  cortrak replaced 4/19  On TF @ 60cc  Repeat swallow assessment as more awake  SLP on board  Hypertension  Home meds:  losartan 100, amlodipine 5  On losartan 50 bid and amlodipine 10 and hydralazine 100 tid . SBP goal < 140  off Cleviprex now   BP Stable  . 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 Multiple foci throughout progressed from previous MRI d/t microangiopathy vs amyloid.  As per husband, pt has some anomia as baseline  BP goal < 140  Hyperlipidemia  Home meds:  lipitor 10  LDL 92  Statin held in setting of acute ICH  Consider continuation of statin at discharge  Hyperglycemia  On steroids - decreased steroids Q12h today the daily tomorrow then stop     HgbA1c 6.1  CBGs q4h  SSI 0-15 -? Change to resistenc  increase novolog 2u q4h->    Hypokalemia and Hypomagnesemia and Hypophosphatemia  Potassium 4.2     Magnesium 2.1     Phosphorus 2.8    Other Stroke Risk Factors  Advanced age  Former Cigarette smoker  Family hx stroke (mother, father, and multiple other family members)  Sedation  Stop all sedating meds - ativan, topamax. recently stopped Seroquel     Add amantadine 100 mg twice daily to help her be  more alert     Add depakene 250 milligram 3 times daily to help with agitation as well as headaches  Other Active Problems  Hypothyroid on synthroid. TSH WNL - resume synthroid. Free T4 normal  Urinary retention, foley placed  Diarrhea with TF - on imodium PRN - now off  Agitation. Seroquel stopped d/t lowering PLTs.   Aortic Atherosclerosis (ICD10-I70.0)   Emphysema (ICD10-J43.9).   Anemia - ? Related to steroids-we will plan to taper and discontinue 03/09/20  Patient continues to be more alert and interactive  after starting amantadine and removing some of her medicines.  Recommend  dexamethasone   stop today  .  Mobilize out of bed.  Physical occupational therapy consults.  Speech therapy for swallow eval as well as cognition. May switch to pradaxa if patient is able to swallow after her modified barium swallow today and continue IV argatroban till then Discussed with patient, speech therapist and husband and answered questions.   This patient is critically ill and at significant risk of neurological worsening, death and care requires constant monitoring of vital signs, hemodynamics,respiratory and cardiac monitoring, extensive review of multiple databases, frequent neurological assessment, discussion with family, other specialists and medical decision making of high complexity.I have made any additions or clarifications directly to the above note.This critical care time does not reflect procedure time, or teaching time or supervisory time of PA/NP/Med Resident etc but could involve care discussion time.  I spent 30 minutes of neurocritical care time  in the care of  this patient.   Antony Contras, [facility] day # 24    Antony Contras, MD  To contact Stroke Continuity provider, please refer to [email or web address] After hours, contact General Neurology

## 2020-03-09 NOTE — Progress Notes (Signed)
Hypoglycemic Event  CBG: 47  Treatment: 33ml D50  Symptoms: asymptomatic  Follow-up CBG: [id number] CBG Result:140  Possible Reasons for Event: Unknown  Comments/MD notified:    [name]

## 2020-03-09 NOTE — Progress Notes (Signed)
Occupational Therapy Treatment Patient Details Name: [name] MRN: [id number] DOB: 31-Mar-1946 Today's Date: 03/09/2020    History of present illness 74 yo female presented with persistent HA, nausea and vomiting pt with stable R lateral ventricle IVH. Pt with acute bil PE 4/13, IVC filter placed 4/14. 4/15 head CT demonstrated deep cerebral thrombosis s/p IR for thrombectomy with expressive aphasia after. 4/16 MRI showed multiple cerebral and cerebellar infarcts, Rt temporal infarct with cytotoxic edema and hypertonic saline started.  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 PMHx: HTN, HLD, hep C, hypothyroidism   OT comments  Pt making excellent progress towards OT goals this session. Session focus on functional mobility as precursor to higher level ADLs. Pt able to ambulate into hallway this session with MOD A +2 d/t R lateral lean with RW with close chair follow. Pt continues to present with expressive aphasia, cognitive deficits, generalized weakness and decreased activity tolerance impacting pt ability to engage in ADLs. Pt continues to be an excellent candidate for CIR level therapies as pt continues to make progress towards OT goals and has strong family support. Will continue to follow acutely per POC.    Follow Up Recommendations  CIR;Supervision/Assistance - 24 hour    Equipment Recommendations  3 in 1 bedside commode;Other (comment)    Recommendations for Other Services      Precautions / Restrictions Precautions Precautions: Fall Precaution Comments: expressive/receptive difficulties Leans R Restrictions Weight Bearing Restrictions: No       Mobility Bed Mobility Overal bed mobility: Needs Assistance Bed Mobility: Supine to Sit   Sidelying to sit: Min assist;HOB elevated(husband providing hand held assist) Supine to sit: Min assist;HOB elevated     General bed mobility comments: Multi modal simple cues used to attempt to motivate pt to EOB, used  husband's assist to do so successfully.  Husband provided min hand held assist to come to EOB from fully elevated bed.   Transfers Overall transfer level: Needs assistance Equipment used: Rolling walker (2 wheeled) Transfers: Sit to/from Stand Sit to Stand: +2 physical assistance;Min assist         General transfer comment: Two person min assist to power up from mildly elevated bed.  Pt had both hands on RW so therapist's stabilized RW (I do not think she is ready for complex command of pushing up from bed).      Balance Overall balance assessment: Needs assistance Sitting-balance support: Feet supported;Bilateral upper extremity supported Sitting balance-Leahy Scale: Fair Sitting balance - Comments: close supervision EOB.  Did not attempt to challenge her today.  Postural control: Right lateral lean Standing balance support: Bilateral upper extremity supported Standing balance-Leahy Scale: Poor Standing balance comment: needs support from RW and therapists                           ADL either performed or assessed with clinical judgement   ADL Overall ADL's : Needs assistance/impaired     Grooming: Total assistance;Brushing hair Grooming Details (indicate cue type and reason): limited d/t ideational apraxia         Upper Body Dressing : Moderate assistance;Sitting Upper Body Dressing Details (indicate cue type and reason): to don gown as back side cover     Toilet Transfer: Moderate assistance;+2 for physical assistance;Ambulation;RW Toilet Transfer Details (indicate cue type and reason): simulated via functional mobility; R lateral lean during ambulation with RW         Functional mobility during  ADLs: Moderate assistance;+2 for physical assistance;+2 for safety/equipment;Rolling walker General ADL Comments: pt able to complete functional mobility this session with RW and MOD A +2 d/t R lateral lean. Pt continues to present with cognitive impairments,  generalized weakness and decreased strength impacting ability to participate in ADLs     Vision       Perception     Praxis      Cognition Arousal/Alertness: Awake/alert Behavior During Therapy: Restless Overall Cognitive Status: Impaired/Different from baseline Area of Impairment: Orientation;Attention;Memory;Following commands;Safety/judgement;Problem solving;Awareness                 Orientation Level: Disoriented to;Place;Time;Situation(able to state [name] to MD) Current Attention Level: Selective Memory: Decreased recall of precautions;Decreased short-term memory Following Commands: Follows one step commands with increased time;Follows one step commands inconsistently Safety/Judgement: Decreased awareness of safety;Decreased awareness of deficits Awareness: Intellectual Problem Solving: Decreased initiation;Difficulty sequencing;Requires verbal cues;Requires tactile cues General Comments: limited by expressive aphasia but utilized son and husband to motivate pt. pt responds well to short simple commands and quiet environment        Exercises     Shoulder Instructions       General Comments VSS    Pertinent Vitals/ Pain       Pain Assessment: Faces Faces Pain Scale: No hurt  Home Living                                          Prior Functioning/Environment              Frequency  Min 2X/week        Progress Toward Goals  OT Goals(current goals can now be found in the care plan section)  Progress towards OT goals: Progressing toward goals  Acute Rehab OT Goals Patient Stated Goal: unable to state clearly, son and husband want her back home OT Goal Formulation: With patient Time For Goal Achievement: 03/13/20 Potential to Achieve Goals: Good  Plan Discharge plan remains appropriate    Co-evaluation    PT/OT/SLP Co-Evaluation/Treatment: Yes Reason for Co-Treatment: Complexity of the patient's impairments  (multi-system involvement);Necessary to address cognition/behavior during functional activity;To address functional/ADL transfers PT goals addressed during session: Mobility/safety with mobility;Balance;Proper use of DME OT goals addressed during session: ADL's and self-care      AM-PAC OT "6 Clicks" Daily Activity     Outcome Measure   Help from another person eating meals?: Total(NPO) Help from another person taking care of personal grooming?: A Lot Help from another person toileting, which includes using toliet, bedpan, or urinal?: A Lot Help from another person bathing (including washing, rinsing, drying)?: A Lot Help from another person to put on and taking off regular upper body clothing?: A Lot Help from another person to put on and taking off regular lower body clothing?: A Lot 6 Click Score: 11    End of Session Equipment Utilized During Treatment: Gait belt;Rolling walker  OT Visit Diagnosis: Unsteadiness on feet (R26.81);Muscle weakness (generalized) (M62.81);Other symptoms and signs involving cognitive function;Other symptoms and signs involving the nervous system (R29.898)   Activity Tolerance Patient tolerated treatment well   Patient Left in chair;with call bell/phone within reach;with chair alarm set;with family/visitor present;with nursing/sitter in room   Nurse Communication Mobility status        Time: [id number] OT Time Calculation (min): 29 min  Charges: OT General Charges $OT Visit: 1 Visit  OT Treatments $Therapeutic Activity: 8-22 mins  Lanier Clam., COTA/L Acute Rehabilitation Services [phone] [facility] 03/09/2020, 10:31 AM

## 2020-03-10 ENCOUNTER — Inpatient Hospital Stay (HOSPITAL_COMMUNITY)
Admission: RE | Admit: 2020-03-10 | Discharge: 2020-03-29 | DRG: 056 | Disposition: A | Discharge status: Home Health | Payer: Medicare HMO | Source: Intra-hospital | Attending: Physical Medicine & Rehabilitation | Admitting: Physical Medicine & Rehabilitation

## 2020-03-10 DIAGNOSIS — I615 Nontraumatic intracerebral hemorrhage, intraventricular: Secondary | ICD-10-CM | POA: Diagnosis not present

## 2020-03-10 DIAGNOSIS — I69391 Dysphagia following cerebral infarction: Secondary | ICD-10-CM

## 2020-03-10 DIAGNOSIS — R609 Edema, unspecified: Secondary | ICD-10-CM | POA: Diagnosis not present

## 2020-03-10 DIAGNOSIS — I10 Essential (primary) hypertension: Secondary | ICD-10-CM | POA: Diagnosis present

## 2020-03-10 DIAGNOSIS — I480 Paroxysmal atrial fibrillation: Secondary | ICD-10-CM | POA: Diagnosis present

## 2020-03-10 DIAGNOSIS — Z8249 Family history of ischemic heart disease and other diseases of the circulatory system: Secondary | ICD-10-CM

## 2020-03-10 DIAGNOSIS — R4587 Impulsiveness: Secondary | ICD-10-CM | POA: Diagnosis not present

## 2020-03-10 DIAGNOSIS — I69319 Unspecified symptoms and signs involving cognitive functions following cerebral infarction: Secondary | ICD-10-CM | POA: Diagnosis not present

## 2020-03-10 DIAGNOSIS — R1311 Dysphagia, oral phase: Secondary | ICD-10-CM

## 2020-03-10 DIAGNOSIS — T380X5A Adverse effect of glucocorticoids and synthetic analogues, initial encounter: Secondary | ICD-10-CM | POA: Diagnosis not present

## 2020-03-10 DIAGNOSIS — Z87891 Personal history of nicotine dependence: Secondary | ICD-10-CM

## 2020-03-10 DIAGNOSIS — R7303 Prediabetes: Secondary | ICD-10-CM | POA: Diagnosis not present

## 2020-03-10 DIAGNOSIS — E785 Hyperlipidemia, unspecified: Secondary | ICD-10-CM | POA: Diagnosis present

## 2020-03-10 DIAGNOSIS — D649 Anemia, unspecified: Secondary | ICD-10-CM | POA: Diagnosis not present

## 2020-03-10 DIAGNOSIS — I2699 Other pulmonary embolism without acute cor pulmonale: Secondary | ICD-10-CM | POA: Diagnosis not present

## 2020-03-10 DIAGNOSIS — E162 Hypoglycemia, unspecified: Secondary | ICD-10-CM | POA: Diagnosis present

## 2020-03-10 DIAGNOSIS — I69398 Other sequelae of cerebral infarction: Principal | ICD-10-CM

## 2020-03-10 DIAGNOSIS — E46 Unspecified protein-calorie malnutrition: Secondary | ICD-10-CM

## 2020-03-10 DIAGNOSIS — Z79890 Hormone replacement therapy (postmenopausal): Secondary | ICD-10-CM

## 2020-03-10 DIAGNOSIS — R799 Abnormal finding of blood chemistry, unspecified: Secondary | ICD-10-CM

## 2020-03-10 DIAGNOSIS — R451 Restlessness and agitation: Secondary | ICD-10-CM | POA: Diagnosis present

## 2020-03-10 DIAGNOSIS — G08 Intracranial and intraspinal phlebitis and thrombophlebitis: Secondary | ICD-10-CM

## 2020-03-10 DIAGNOSIS — D7582 Heparin induced thrombocytopenia (HIT): Secondary | ICD-10-CM | POA: Diagnosis present

## 2020-03-10 DIAGNOSIS — R2689 Other abnormalities of gait and mobility: Secondary | ICD-10-CM | POA: Diagnosis present

## 2020-03-10 DIAGNOSIS — Z79899 Other long term (current) drug therapy: Secondary | ICD-10-CM

## 2020-03-10 DIAGNOSIS — R1312 Dysphagia, oropharyngeal phase: Secondary | ICD-10-CM | POA: Diagnosis present

## 2020-03-10 DIAGNOSIS — E039 Hypothyroidism, unspecified: Secondary | ICD-10-CM | POA: Diagnosis not present

## 2020-03-10 DIAGNOSIS — D72829 Elevated white blood cell count, unspecified: Secondary | ICD-10-CM | POA: Diagnosis not present

## 2020-03-10 DIAGNOSIS — D45 Polycythemia vera: Secondary | ICD-10-CM | POA: Diagnosis present

## 2020-03-10 DIAGNOSIS — E8809 Other disorders of plasma-protein metabolism, not elsewhere classified: Secondary | ICD-10-CM | POA: Diagnosis not present

## 2020-03-10 DIAGNOSIS — Z888 Allergy status to other drugs, medicaments and biological substances status: Secondary | ICD-10-CM

## 2020-03-10 DIAGNOSIS — R600 Localized edema: Secondary | ICD-10-CM | POA: Diagnosis present

## 2020-03-10 DIAGNOSIS — R1310 Dysphagia, unspecified: Secondary | ICD-10-CM

## 2020-03-10 DIAGNOSIS — J449 Chronic obstructive pulmonary disease, unspecified: Secondary | ICD-10-CM | POA: Diagnosis not present

## 2020-03-10 DIAGNOSIS — F05 Delirium due to known physiological condition: Secondary | ICD-10-CM | POA: Diagnosis present

## 2020-03-10 DIAGNOSIS — Z95828 Presence of other vascular implants and grafts: Secondary | ICD-10-CM | POA: Diagnosis not present

## 2020-03-10 DIAGNOSIS — I7389 Other specified peripheral vascular diseases: Secondary | ICD-10-CM | POA: Diagnosis present

## 2020-03-10 DIAGNOSIS — I639 Cerebral infarction, unspecified: Secondary | ICD-10-CM | POA: Diagnosis not present

## 2020-03-10 DIAGNOSIS — M1812 Unilateral primary osteoarthritis of first carpometacarpal joint, left hand: Secondary | ICD-10-CM | POA: Diagnosis not present

## 2020-03-10 DIAGNOSIS — Z881 Allergy status to other antibiotic agents status: Secondary | ICD-10-CM

## 2020-03-10 DIAGNOSIS — R69 Illness, unspecified: Secondary | ICD-10-CM | POA: Diagnosis not present

## 2020-03-10 DIAGNOSIS — I4892 Unspecified atrial flutter: Secondary | ICD-10-CM | POA: Diagnosis not present

## 2020-03-10 DIAGNOSIS — M810 Age-related osteoporosis without current pathological fracture: Secondary | ICD-10-CM | POA: Diagnosis present

## 2020-03-10 DIAGNOSIS — D473 Essential (hemorrhagic) thrombocythemia: Secondary | ICD-10-CM

## 2020-03-10 DIAGNOSIS — Z7952 Long term (current) use of systemic steroids: Secondary | ICD-10-CM

## 2020-03-10 DIAGNOSIS — I8290 Acute embolism and thrombosis of unspecified vein: Secondary | ICD-10-CM

## 2020-03-10 DIAGNOSIS — E782 Mixed hyperlipidemia: Secondary | ICD-10-CM | POA: Diagnosis not present

## 2020-03-10 DIAGNOSIS — R739 Hyperglycemia, unspecified: Secondary | ICD-10-CM | POA: Diagnosis not present

## 2020-03-10 DIAGNOSIS — I610 Nontraumatic intracerebral hemorrhage in hemisphere, subcortical: Secondary | ICD-10-CM | POA: Diagnosis not present

## 2020-03-10 DIAGNOSIS — Z7982 Long term (current) use of aspirin: Secondary | ICD-10-CM

## 2020-03-10 DIAGNOSIS — D72828 Other elevated white blood cell count: Secondary | ICD-10-CM | POA: Diagnosis not present

## 2020-03-10 DIAGNOSIS — G441 Vascular headache, not elsewhere classified: Secondary | ICD-10-CM | POA: Diagnosis present

## 2020-03-10 DIAGNOSIS — Z794 Long term (current) use of insulin: Secondary | ICD-10-CM

## 2020-03-10 LAB — GLUCOSE, CAPILLARY
Glucose-Capillary: 109 mg/dL — ABNORMAL HIGH (ref 70–99)
Glucose-Capillary: 118 mg/dL — ABNORMAL HIGH (ref 70–99)
Glucose-Capillary: 138 mg/dL — ABNORMAL HIGH (ref 70–99)
Glucose-Capillary: 157 mg/dL — ABNORMAL HIGH (ref 70–99)
Glucose-Capillary: 172 mg/dL — ABNORMAL HIGH (ref 70–99)
Glucose-Capillary: 179 mg/dL — ABNORMAL HIGH (ref 70–99)

## 2020-03-10 LAB — HEPATIC FUNCTION PANEL
ALT: 54 U/L — ABNORMAL HIGH (ref 0–44)
AST: 36 U/L (ref 15–41)
Albumin: 2.5 g/dL — ABNORMAL LOW (ref 3.5–5.0)
Alkaline Phosphatase: 124 U/L (ref 38–126)
Bilirubin, Direct: 0.1 mg/dL (ref 0.0–0.2)
Indirect Bilirubin: 0.5 mg/dL (ref 0.3–0.9)
Total Bilirubin: 0.6 mg/dL (ref 0.3–1.2)
Total Protein: 4.8 g/dL — ABNORMAL LOW (ref 6.5–8.1)

## 2020-03-10 LAB — CBC
HCT: 24 % — ABNORMAL LOW (ref 36.0–46.0)
Hemoglobin: 7.4 g/dL — ABNORMAL LOW (ref 12.0–15.0)
MCH: 31.2 pg (ref 26.0–34.0)
MCHC: 30.8 g/dL (ref 30.0–36.0)
MCV: 101.3 fL — ABNORMAL HIGH (ref 80.0–100.0)
Platelets: 268 10*3/uL (ref 150–400)
RBC: 2.37 MIL/uL — ABNORMAL LOW (ref 3.87–5.11)
RDW: 20.6 % — ABNORMAL HIGH (ref 11.5–15.5)
WBC: 12.2 10*3/uL — ABNORMAL HIGH (ref 4.0–10.5)
nRBC: 0 % (ref 0.0–0.2)

## 2020-03-10 LAB — BASIC METABOLIC PANEL
Anion gap: 8 (ref 5–15)
BUN: 31 mg/dL — ABNORMAL HIGH (ref 8–23)
CO2: 24 mmol/L (ref 22–32)
Calcium: 8.7 mg/dL — ABNORMAL LOW (ref 8.9–10.3)
Chloride: 107 mmol/L (ref 98–111)
Creatinine, Ser: 0.83 mg/dL (ref 0.44–1.00)
GFR calc Af Amer: >60 mL/min (ref >60)
GFR calc non Af Amer: >60 mL/min (ref >60)
Glucose, Bld: 200 mg/dL — ABNORMAL HIGH (ref 70–99)
Potassium: 4.2 mmol/L (ref 3.5–5.1)
Sodium: 139 mmol/L (ref 135–145)

## 2020-03-10 LAB — DIRECT ANTIGLOBULIN TEST (NOT AT ARMC)
DAT, IgG: NEGATIVE
DAT, complement: NEGATIVE

## 2020-03-10 LAB — APTT
aPTT: 52 seconds — ABNORMAL HIGH (ref 24–36)
aPTT: 52 seconds — ABNORMAL HIGH (ref 24–36)

## 2020-03-10 LAB — LACTATE DEHYDROGENASE: LDH: 410 U/L — ABNORMAL HIGH (ref 98–192)

## 2020-03-10 LAB — RETICULOCYTES
Immature Retic Fract: 21 % — ABNORMAL HIGH (ref 2.3–15.9)
RBC.: 2.47 MIL/uL — ABNORMAL LOW (ref 3.87–5.11)
Retic Count, Absolute: 135.9 10*3/uL (ref 19.0–186.0)
Retic Ct Pct: 5.5 % — ABNORMAL HIGH (ref 0.4–3.1)

## 2020-03-10 MED ORDER — ARGATROBAN 50 MG/50ML IV SOLN: 0.6500 ug/kg/min | INTRAVENOUS | 3 refills | Status: DC

## 2020-03-10 MED ORDER — GERHARDT'S BUTT CREAM: TOPICAL_CREAM | CUTANEOUS | Status: DC | PRN

## 2020-03-10 MED ORDER — DEXAMETHASONE SODIUM PHOSPHATE 4 MG/ML IJ SOLN: 4.0000 mg | Freq: Two times a day (BID) | INTRAMUSCULAR | 3 refills | Status: DC

## 2020-03-10 MED ORDER — LABETALOL HCL 5 MG/ML IV SOLN
10.0000 mg | INTRAVENOUS | Status: DC | PRN
  Filled 2020-03-10: qty 4

## 2020-03-10 MED ORDER — FLEET ENEMA 7-19 GM/118ML RE ENEM: 1.0000 | ENEMA | Freq: Once | RECTAL | Status: DC | PRN

## 2020-03-10 MED ORDER — INSULIN ASPART 100 UNIT/ML ~~LOC~~ SOLN
0.0000 [IU] | SUBCUTANEOUS | Status: DC
  Administered 2020-03-10 – 2020-03-11 (×2): 3 [IU] via SUBCUTANEOUS
  Administered 2020-03-11: 4 [IU] via SUBCUTANEOUS
  Administered 2020-03-12 – 2020-03-13 (×3): 3 [IU] via SUBCUTANEOUS
  Administered 2020-03-13: 4 [IU] via SUBCUTANEOUS
  Administered 2020-03-13: 3 [IU] via SUBCUTANEOUS
  Administered 2020-03-14: 4 [IU] via SUBCUTANEOUS

## 2020-03-10 MED ORDER — GUAIFENESIN-DM 100-10 MG/5ML PO SYRP: 5.0000 mL | ORAL_SOLUTION | Freq: Four times a day (QID) | ORAL | Status: DC | PRN

## 2020-03-10 MED ORDER — POTASSIUM CHLORIDE 20 MEQ/15ML (10%) PO SOLN
40.0000 meq | Freq: Every day | ORAL | Status: DC
  Administered 2020-03-11 – 2020-03-14 (×4): 40 meq
  Filled 2020-03-10 (×4): qty 30

## 2020-03-10 MED ORDER — SODIUM CHLORIDE 0.9% FLUSH: 10.0000 mL | INTRAVENOUS | Status: DC | PRN

## 2020-03-10 MED ORDER — ALUM & MAG HYDROXIDE-SIMETH 200-200-20 MG/5ML PO SUSP: 30.0000 mL | ORAL | Status: DC | PRN

## 2020-03-10 MED ORDER — BISACODYL 10 MG RE SUPP
10.0000 mg | Freq: Every day | RECTAL | Status: DC | PRN
  Administered 2020-03-16: 10 mg via RECTAL
  Filled 2020-03-10: qty 1

## 2020-03-10 MED ORDER — PRO-STAT SUGAR FREE PO LIQD: 30.0000 mL | Freq: Every day | ORAL | 0 refills | Status: DC

## 2020-03-10 MED ORDER — FLUTICASONE FUROATE-VILANTEROL 200-25 MCG/INH IN AEPB
1.0000 | INHALATION_SPRAY | Freq: Every day | RESPIRATORY_TRACT | Status: DC
  Administered 2020-03-11 – 2020-03-29 (×17): 1 via RESPIRATORY_TRACT

## 2020-03-10 MED ORDER — ACETAMINOPHEN 325 MG PO TABS
325.0000 mg | ORAL_TABLET | ORAL | Status: DC | PRN
  Administered 2020-03-24: 650 mg
  Filled 2020-03-10: qty 2

## 2020-03-10 MED ORDER — HYDRALAZINE HCL 100 MG PO TABS: 100.0000 mg | ORAL_TABLET | Freq: Three times a day (TID) | ORAL | 3 refills | Status: DC

## 2020-03-10 MED ORDER — AMLODIPINE BESYLATE 10 MG PO TABS
10.0000 mg | ORAL_TABLET | Freq: Every day | ORAL | Status: DC
  Administered 2020-03-11 – 2020-03-14 (×4): 10 mg
  Filled 2020-03-10 (×4): qty 1

## 2020-03-10 MED ORDER — ORAL CARE MOUTH RINSE
15.0000 mL | Freq: Two times a day (BID) | OROMUCOSAL | Status: DC
  Administered 2020-03-11 – 2020-03-28 (×32): 15 mL via OROMUCOSAL

## 2020-03-10 MED ORDER — AMANTADINE HCL 50 MG/5ML PO SYRP: 100.0000 mg | ORAL_SOLUTION | Freq: Two times a day (BID) | ORAL | 0 refills | Status: DC

## 2020-03-10 MED ORDER — QUETIAPINE FUMARATE 25 MG PO TABS
25.0000 mg | ORAL_TABLET | Freq: Every evening | ORAL | Status: DC | PRN
  Administered 2020-03-10 – 2020-03-18 (×4): 25 mg via ORAL
  Filled 2020-03-10 (×4): qty 1

## 2020-03-10 MED ORDER — CHLORHEXIDINE GLUCONATE 0.12 % MT SOLN
15.0000 mL | Freq: Two times a day (BID) | OROMUCOSAL | Status: DC
  Administered 2020-03-10 – 2020-03-29 (×34): 15 mL via OROMUCOSAL
  Filled 2020-03-10 (×34): qty 15

## 2020-03-10 MED ORDER — PRO-STAT SUGAR FREE PO LIQD
30.0000 mL | Freq: Every day | ORAL | Status: DC
  Administered 2020-03-11 – 2020-03-22 (×11): 30 mL
  Filled 2020-03-10 (×11): qty 30

## 2020-03-10 MED ORDER — INSULIN ASPART 100 UNIT/ML ~~LOC~~ SOLN
5.0000 [IU] | SUBCUTANEOUS | Status: DC
  Administered 2020-03-10 – 2020-03-14 (×18): 5 [IU] via SUBCUTANEOUS

## 2020-03-10 MED ORDER — DEXAMETHASONE SODIUM PHOSPHATE 4 MG/ML IJ SOLN
4.0000 mg | Freq: Two times a day (BID) | INTRAMUSCULAR | Status: DC
  Administered 2020-03-10 – 2020-03-14 (×9): 4 mg via INTRAVENOUS
  Filled 2020-03-10 (×10): qty 1

## 2020-03-10 MED ORDER — RESOURCE THICKENUP CLEAR PO POWD: ORAL | Status: DC | PRN

## 2020-03-10 MED ORDER — HYDRALAZINE HCL 50 MG PO TABS
100.0000 mg | ORAL_TABLET | Freq: Three times a day (TID) | ORAL | Status: DC
  Administered 2020-03-10 – 2020-03-13 (×10): 100 mg
  Administered 2020-03-14: 50 mg
  Administered 2020-03-14: 100 mg
  Filled 2020-03-10 (×12): qty 2

## 2020-03-10 MED ORDER — VALPROIC ACID 250 MG/5ML PO SOLN
250.0000 mg | Freq: Two times a day (BID) | ORAL | Status: DC
  Administered 2020-03-10 – 2020-03-14 (×8): 250 mg
  Filled 2020-03-10 (×8): qty 5

## 2020-03-10 MED ORDER — AMLODIPINE BESYLATE 10 MG PO TABS: 10.0000 mg | ORAL_TABLET | Freq: Every day | ORAL | 3 refills | Status: DC

## 2020-03-10 MED ORDER — CHLORHEXIDINE GLUCONATE CLOTH 2 % EX PADS
6.0000 | MEDICATED_PAD | Freq: Every day | CUTANEOUS | Status: DC
  Administered 2020-03-13 – 2020-03-14 (×2): 6 via TOPICAL

## 2020-03-10 MED ORDER — SODIUM CHLORIDE 0.9% FLUSH
10.0000 mL | Freq: Two times a day (BID) | INTRAVENOUS | Status: DC
  Administered 2020-03-10 – 2020-03-23 (×14): 10 mL
  Administered 2020-03-24 – 2020-03-25 (×2): 20 mL
  Administered 2020-03-29: 10 mL

## 2020-03-10 MED ORDER — GLUCERNA 1.2 CAL PO LIQD
1000.0000 mL | ORAL | Status: DC
  Administered 2020-03-10 – 2020-03-13 (×3): 1000 mL
  Filled 2020-03-10 (×5): qty 1000

## 2020-03-10 MED ORDER — ONDANSETRON HCL 4 MG/2ML IJ SOLN
4.0000 mg | INTRAMUSCULAR | Status: DC | PRN
  Administered 2020-03-12 – 2020-03-16 (×4): 4 mg via INTRAVENOUS
  Filled 2020-03-10 (×4): qty 2

## 2020-03-10 MED ORDER — SENNOSIDES-DOCUSATE SODIUM 8.6-50 MG PO TABS
1.0000 | ORAL_TABLET | Freq: Two times a day (BID) | ORAL | Status: DC
  Administered 2020-03-10 – 2020-03-15 (×10): 1
  Filled 2020-03-10 (×10): qty 1

## 2020-03-10 MED ORDER — SODIUM CHLORIDE 0.9% FLUSH
10.0000 mL | INTRAVENOUS | Status: DC | PRN
  Administered 2020-03-17 – 2020-03-24 (×2): 10 mL

## 2020-03-10 MED ORDER — GLUCERNA 1.2 CAL PO LIQD: 1000.0000 mL | ORAL | 3 refills | Status: DC

## 2020-03-10 MED ORDER — VALPROIC ACID 250 MG/5ML PO SOLN: 250.0000 mg | Freq: Two times a day (BID) | ORAL | 3 refills | Status: DC

## 2020-03-10 MED ORDER — QUETIAPINE FUMARATE 25 MG PO TABS: 25.0000 mg | ORAL_TABLET | Freq: Every evening | ORAL | 3 refills | Status: DC | PRN

## 2020-03-10 MED ORDER — INSULIN ASPART 100 UNIT/ML ~~LOC~~ SOLN: 5.0000 [IU] | SUBCUTANEOUS | 11 refills | Status: DC

## 2020-03-10 MED ORDER — AMANTADINE HCL 50 MG/5ML PO SYRP
100.0000 mg | ORAL_SOLUTION | Freq: Two times a day (BID) | ORAL | Status: DC
  Administered 2020-03-10 – 2020-03-14 (×8): 100 mg
  Filled 2020-03-10 (×11): qty 10

## 2020-03-10 MED ORDER — LOSARTAN POTASSIUM 50 MG PO TABS: 50.0000 mg | ORAL_TABLET | Freq: Two times a day (BID) | ORAL | 3 refills | Status: DC

## 2020-03-10 MED ORDER — LOSARTAN POTASSIUM 50 MG PO TABS
50.0000 mg | ORAL_TABLET | Freq: Two times a day (BID) | ORAL | Status: DC
  Administered 2020-03-10 – 2020-03-14 (×8): 50 mg
  Filled 2020-03-10 (×8): qty 1

## 2020-03-10 MED ORDER — SODIUM CHLORIDE 0.9% FLUSH
10.0000 mL | Freq: Two times a day (BID) | INTRAVENOUS | Status: DC
  Administered 2020-03-10 – 2020-03-14 (×3): 10 mL

## 2020-03-10 MED ORDER — LEVOTHYROXINE SODIUM 125 MCG PO TABS: 125.0000 ug | ORAL_TABLET | Freq: Every day | ORAL | 3 refills | Status: DC

## 2020-03-10 MED ORDER — ATORVASTATIN CALCIUM 10 MG PO TABS
10.0000 mg | ORAL_TABLET | Freq: Every day | ORAL | Status: DC
  Administered 2020-03-11 – 2020-03-14 (×4): 10 mg
  Filled 2020-03-10 (×4): qty 1

## 2020-03-10 MED ORDER — ATORVASTATIN CALCIUM 10 MG PO TABS: 10.0000 mg | ORAL_TABLET | Freq: Every day | ORAL | 3 refills | Status: DC

## 2020-03-10 MED ORDER — LEVOTHYROXINE SODIUM 25 MCG PO TABS
125.0000 ug | ORAL_TABLET | Freq: Every day | ORAL | Status: DC
  Administered 2020-03-11 – 2020-03-14 (×4): 125 ug
  Filled 2020-03-10 (×4): qty 1

## 2020-03-10 MED ORDER — ARGATROBAN 50 MG/50ML IV SOLN
0.7500 ug/kg/min | INTRAVENOUS | Status: DC
  Administered 2020-03-10 – 2020-03-13 (×6): 0.65 ug/kg/min via INTRAVENOUS
  Administered 2020-03-14 (×2): 0.7 ug/kg/min via INTRAVENOUS
  Administered 2020-03-14 – 2020-03-20 (×11): 0.75 ug/kg/min via INTRAVENOUS
  Filled 2020-03-10 (×22): qty 50

## 2020-03-10 MED ORDER — INSULIN ASPART 100 UNIT/ML ~~LOC~~ SOLN: 0.0000 [IU] | SUBCUTANEOUS | 11 refills | Status: DC

## 2020-03-10 NOTE — Progress Notes (Signed)
STROKE TEAM PROGRESS NOTE   INTERVAL HISTORY Patient  is sitting up in bed.  She remains aphasic and disoriented and slightly confused.  Follows only simple commands    vital signs are stable.  Platelet counts are now up to 268000.  And hematocrit is lower at 24.0  Her husband is not at the bedside.  . Await rehab decision.  Speech therapist yet recommends medications crushed with pure and she remains on argatroban and not on Pradaxa which cannot be crushed Vitals:   03/10/20 0035 03/10/20 0500 03/10/20 0730 03/10/20 1212  BP: 110/61  104/75 124/67  Pulse: 90  91 92  Resp: 18  16 17   Temp: 98.8 F (37.1 C)  98.3 F (36.8 C) 98 F (36.7 C)  TempSrc: Oral  Oral Oral  SpO2: 99%  99% 96%  Weight:  75.6 kg    Height:       CBC:  Recent Labs  Lab 03/08/20 0500 03/08/20 0500 03/09/20 0500 03/10/20 0500  WBC 14.0*   < > 13.1* 12.2*  NEUTROABS 11.3*  --  11.4*  --   HGB 8.8*   < > 7.7* 7.4*  HCT 29.0*   < > 24.8* 24.0*  MCV 102.5*   < > 99.2 101.3*  PLT 188   < > 223 268   < > = values in this interval not displayed.   Basic Metabolic Panel:  Recent Labs  Lab 03/09/20 0500 03/10/20 0500  NA 140 139  K 4.0 4.2  CL 107 107  CO2 24 24  GLUCOSE 153* 200*  BUN 30* 31*  CREATININE 0.83 0.83  CALCIUM 8.9 8.7*    IMAGING past 24 hours No results found.   PHYSICAL EXAM      General - Well nourished, well developed elderly Caucasian lady, restless and agitated with exam  Ophthalmologic - fundi not visualized due to noncooperation.She resists eye opening.   Cardiovascular - Regular rhythm and rate, not in afib.  Neuro -patient is awake and alert   She has mixed aphasia but can be clearly understood and speaks short sentences.  She has some word finding difficulties and nonfluent speech.  She can follow only simple midline and one-step commands.  She has diminished attention registration and recall.  She is disoriented.  She cannot name her son she blinks to threat more on the  left than on the right.  No facial weakness.  Tongue midline.  She moves all 4 extremities well against gravity without focal weakness.  . Sensation, coordination not cooperative and gait not tested.   ASSESSMENT/PLAN Ms. VINCENTIA DAVERSA is a 74 y.o. female with history of HTN presenting with severe persistent HA accompanied by nausea and vomiting. This has now improved.    1.  Stroke: multiple punctate B anterior and posterior circulation embolic infarcts in setting of PAF and Polycythemia Vera with clotting issues 2.  IVH - right lateral ventricle IVH secondary to ? CAA vs. HTN  CT head 4/5 0032 R lateral IVH. Severe chronic ischemic microangiopathy.  MRI 4/5 Stable IVH, severe leukoaraiosis. Multiple foci throughout progressed from previous MRI d/t microangiopathy vs amyloid.  MRA unremarkable   CT head repeat 4/5-4/10 - unchanged size and configuration of right IVH  CT Head 02/26/20 - Increased edema in the left greater than right temporal and occipital lobes which may reflect worsening edema from venous hypertension and venous infarcts.   CT head 02/28/20 decreased L>R edema. IVH same. Known SVT.  MRI 4/20 - left temporal and parietal significant petechial hemorrhage, likely due to significant CVST. Numerous punctate infarcts throughout brain. Numerous chronic microhemorrhages c/w CAA.  CT head 4/22 stable L temporal and occipital petechial hemorrhage. Stable IVH. Mild intracranial mass effect no midline shift. CSVT stable w/ edema.  CT Chest w contrast - 03/03/20 - Recent pulmonary embolus with development of pulmonary infarct in the right lower lobe. Nodular opacities in the right lower lobe have otherwise improved, likely improving infection.  CT Abdomen and Pelvis w contrast - 03/03/20 - Generalized body wall edema, with mild retroperitoneal edema, nonspecific. No evidence of other acute finding in the abdomen or pelvis.  Colonic diverticulosis without diverticulitis.   EEG -  moderate diffuse encephalopathy  2D Echo EF 65-70%. No source of embolus   LDL 92   HgbA1c 6.1   DVT prophylaxis - argatroban IV   aspirin 81 mg daily prior to admission, now on argatroban due to HIT -:Plan AC for at least 6 mos. 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 Dr. Ron Agee reaching out to Dr. Gwenlyn Found to discuss stopping amiodarone   LDH 427 PLT ct c/w citrate adamts 13 activity pending HIT ab 2.553 -> 1.469 reticulocytes 76.4  Hematology on board   HIT syndrome  B/l PE cerebral venous sinus thrombosis s/p IR ? Drug induced thrombocytopenia  4/13 New SOB and tachycardia  CTA chest - BLE PE.   Filter placement on 4/13 as unable to get [facility] d/t IVH  4/15 CT head - straight sinus and vein of Galan CSVT  CTV Confirmed extensive thrombus within the confluence of sinuses and straight sinus, also extending into the vein of Galen. There is also fairly extensive multifocal thrombus within the right greater than left transverse and sigmoid dural venous sinuses.  S/p mechanical thrombectomy 02/24/20 of occluded deep cerebral veins  MRI 02/25/2020 shows multiple by cerebral and cerebellar infarcts as well as right temporal cortical venous infarct and cytotoxic edema  MRV persistent occlusion of 3 sinus, vein of Galan, right IJ, right basal vein of [place]  CT head 4/17 increase edema in the left greater than right temporal and occipital lobes  CT head 4/19 decreased L>R edema. IVH same. Known  SVT.  MRV 4/20 - dural venous sinus unchanged.   MRI 4/20 - left temporal and parietal significant petechial hemorrhage, likely due to significant CVST.   CT head 4/22 stable L temporal and occipital petechial hemorrhage. Stable IVH. Mild intracranial mass effect no midline shift. DVT stable w/ edema.  HIT antibody positive  On argatroban IV -> continue, may switch to DOAC later. AC at least 6 months  Decadron 10mg  x 1 -> 4mg  q6  Off 3% saline   Na Q 6 hrs -?d/c  Na goal 150 - 155  Na 150     Repeat HIT ab pending   D/c potential offending meds - amiodraone, oxycodone, fioricet, imodium, seroquel  PLT 990->...->85   [id number]  CT chest, abd/pelvis  to [place] out any possible pathology, was negative  CT Chest w contrast - 03/03/20 - Recent pulmonary embolus with development of pulmonary infarct in the right lower lobe. Nodular opacities in the right lower lobe have otherwise improved, likely improving infection.  CT Abdomen and Pelvis w contrast - 03/03/20 - Generalized body wall edema, with mild retroperitoneal edema, nonspecific. No evidence of other acute finding in the abdomen or pelvis.  Colonic diverticulosis without diverticulitis.   Q6h Na checks -changed to daily now  Headache, due to IVH and CSVT  Depakote 500 mg nightly discontinued  Pt now has Fioricet one Q8 hrs prn as well as tylenol prn  Solumedrol 500 mg -> prednisone -> off  NS at 100 cc's / hr started 4/10 -> 3% saline  Add topamax 25 bid  Decadron started 10 mg IV x 1 then 4 mg IV Q 6 hrs (started 02/27/20) tapered and will discontinue 03/09/2020  CT head 4/17 increase edema in the left greater than right temporal and occipital lobes  CT head 4/19 decreased L>R edema. IVH same. Known SVT.  MRI and MRV4/20 dural venous sinus unchanged. left temporal and parietal significant petechial hemorrhage, likely due to significant CVST.   CT head 4/22  stable L temporal and occipital petechial hemorrhage. Stable  IVH. Mild intracranial mass effect no midline shift. CSVT stable w/ edema.  Continue steroids until swelling decreasing  Afib RVR, new diagnosis  Rate in 160's -> back to sinus  Diltizem gtt discontinued  Amiodarone d/c'ed in concern of drug induced thrombocytopenia  TSH 5.311  Free T4 normal  OK with AC for 6 -9 months only due to acute illness and no recurrence of afib. Switch to DOAC once more stable  Cardiology signed off 02/26/20  Stopped amiodarone 4/22  Dysphagia  Made NPO status due to altered mental status  cortrak placed under fluoro 02/26/20 -> pt pulled off same day  cortrak replaced 4/19  On TF @ 60cc  Repeat swallow assessment as more awake  SLP on board  Hypertension  Home meds:  losartan 100, amlodipine 5  On losartan 50 bid and amlodipine 10 and hydralazine 100 tid . SBP goal < 140  off Cleviprex now   BP Stable  . 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 Multiple foci throughout progressed from previous MRI d/t microangiopathy vs amyloid.  As per husband, pt has some anomia as baseline  BP goal < 140  Hyperlipidemia  Home meds:  lipitor 10  LDL 92  Statin held in setting of acute ICH  Consider continuation of statin at discharge  Hyperglycemia  On steroids - decreased steroids Q12h today the daily tomorrow then stop     HgbA1c 6.1  CBGs q4h  SSI 0-15 -? Change to resistenc  increase novolog 2u q4h->    Hypokalemia and Hypomagnesemia and Hypophosphatemia  Potassium 4.2     Magnesium 2.1     Phosphorus 2.8    Other Stroke Risk Factors  Advanced age  Former Cigarette smoker  Family hx stroke (mother, father, and multiple other family members)  Sedation  Stop all sedating meds - ativan, topamax. recently stopped Seroquel     Add  amantadine 100 mg twice daily to help her be more alert     Add depakene 250 milligram 3 times daily to help with  agitation as well as headaches  Other Active Problems  Hypothyroid on synthroid. TSH WNL - resume synthroid. Free T4 normal  Urinary retention, foley placed  Diarrhea with TF - on imodium PRN - now off  Agitation. Seroquel stopped d/t lowering PLTs.   Aortic Atherosclerosis (ICD10-I70.0)   Emphysema (ICD10-J43.9).   Anemia - ? Related to steroids-we will plan to taper and discontinue 03/09/20  Patient continues to be more alert and interactive  after starting amantadine and removing some of her medicines.  Recommend   .  Mobilize out of bed.  Continue ongoing physical occupational therapy consults.  Speech therapy for swallow eval as well as cognition. May switch to pradaxa if patient is able to swallow Pradaxa without needing to crush it and continue IV argatroban till then.  Possible transfer to inpatient rehab in the next few days after bed availability.  Medically stable for transfer Discussed with patient, speech therapist and rehab coordinator and answered questions.   Greater than 50% time during this 35-minute visit were spent on counseling and coordination of care about her intracerebral hemorrhage, venous sinus thrombosis, dysphagia and thrombocytopenia and answering questions    Hospital day # [place], [place]  To contact Stroke Continuity provider, please refer to [email or web address] After hours, contact General Neurology

## 2020-03-10 NOTE — H&P (Signed)
Physical Medicine and Rehabilitation Admission H&P    CC: Stroke with functional deficits.    HPI: [name]. Suk is a 74 year old female with history of HTN, Hep C who was admitted on 02/14/20 with onset of severe persistent HA and N/V.  History taken from chart review due to cognition. CT head done revealing acute IVH in right lateral ventricle and severe chronic ischemic microangiopathy.  CBC revealed thrombocytosis with platelets 990, Hgb 17.7 and WBC 12.0.  Neurology question of bleed was HTN versus CVA related.  Dr. Jana Hakim was consulted for input and recommended bone biopsy as well as leukopheresis for work-up of panmyelosis felt to be due to polycytopenia vera.  2D echo showing EF 65-70%.  She was placed on low-dose Cleviprex and was started on pheresis with improvement in cell counts.  Bone biopsy was positive for JAK2 confirming polycythemia vera. She did not meet criteria for von Willebrand disease with borderline low ristocetin cofacto and to be repeated once patient in remission.  She developed lethargy on 04/05 with repeat CT showing IVH without change.  MRI/MRA brain ordered for work-up and showed stable bleed with progression of extensive confluent hyperintensity throughout the brain question due to microangiopathy versus amyloid angiopathy.  She completed leukapheresis x3 with plans for DCCV  but had worsening of headache on 4/10.  As hemoglobin was up to 17.3, she underwent phlebotomy of 1 units and CTA head done showing patent vessels but suboptimal evaluation due to artifact.  EEG done for work-up and showed moderate diffuse encephalopathy and was negative for seizures.  Depakote was added with some improvement in headaches.  She did develop SOB with intermittent tachycardia and CTA chest done showing acute BLL emboli and emphysematous change in BUL.  BLE Dopplers negative for DVT.  IVC filter was placed by Dr. Anselm Pancoast on 04/13 due to [place].  She did develop A. fib with RVR with short  runs of NSVT on 04/14.  Cardiology was consulted for input and recommended IV metoprolol as well as question of cardioversion due to hypotension.  She was transferred to ICU for monitoring and IV amiodarone added to controlled recurrent Atrial flutter.  She did develop increasing headaches with drop in platelets less than 2400.  Work-up was positive for HIT and and IV Solu-Medrol was added.  CT head was repeated on 04/14 showing hyperdensity and straight sinus and follow-up CT venogram revealed extensive thrombus within confluence of sinuses and straight sinus extending into the vein of Galan and fairly extensive multifocal thrombus within right greater than left transverse and sigmoid dural venous sinuses.  She underwent emergent thrombectomy with recanalization of deep system and decrease in clot burden. Follow-up MRI brain on 04/16 showed numerous tiny acute infarcts in bilateral cerebral and cerebellar hemispheres felt to be embolic, 2.3 cm acute cortical infarct right temporal and occipital lobes, new region of parenchymal swelling left frontal lobe and frontal operculum and unchanged subacute IVH without hydrocephalus.    She was started on hypertonic saline for edema control and case was discussed with Dr. Jana Hakim.  He recommended lower therapeutic range of argatroban for treatment.  She was also started on IV steroids, IV Solu-Medrol 500 mg x 3 days and has been tapered to Decadron.  Cognition has fluctuated between lethargy and bouts of agitation.   She did develop drug-induced thrombocytopenia felt to be related to amiodarone which was discontinued as heart rate was stable.  CT abdomen pelvis was ordered due to ongoing issues with  restlessness and showed generalized body wall edema with mild retroperitoneal edema but no other acute abnormality. Hyperglycemia due to steroids being managed with sliding scale insulin.  She continues on tube feeds for nutritional support MBS performed 4/29 and testing was  limited as patient refused all p.o.'s and oropharyngeal dysphagia appeared to be cognitive in nature and aspiration of thins noted. She was started on dysphagia 1. nectar liquids Dr. Jana Hakim recommends argatroban until able to tolerate p.o.'s at which time she could be switched to [place]. Sedating medications which were discontinued and amantadine added to help with activation. Depakote resumed 4/26 to help manage HA is slowly being added back to help manage headaches and agitation. Please see preadmission assessment from today as well.   Review of Systems  Unable to perform ROS: Mental acuity    Past Medical History:  Diagnosis Date  . Hepatitis C   . Hyperlipidemia   . Hypertension   . Osteoporosis     Past Surgical History:  Procedure Laterality Date  . IR ANGIO INTRA EXTRACRAN SEL INTERNAL CAROTID BILAT MOD SED  02/24/2020  . IR ANGIO VERTEBRAL SEL VERTEBRAL UNI R MOD SED  02/24/2020  . IR ANGIOGRAM SELECTIVE EACH ADDITIONAL VESSEL  02/24/2020  . IR IVC FILTER PLMT / S&I /IMG GUID/MOD SED  02/22/2020  . IR THROMBECT VENO MECH MOD SED  02/24/2020  . IR US GUIDE VASC ACCESS RIGHT  02/24/2020  . IR US GUIDE VASC ACCESS RIGHT  02/24/2020  . IR VENO SAGITTAL SINUS  02/24/2020  . IR VENO/JUGULAR RIGHT  02/24/2020  . RADIOLOGY WITH ANESTHESIA N/A 02/24/2020   Procedure: IR WITH ANESTHESIA;  Surgeon: Radiologist, Medication, MD;  Location: [facility];  Service: Radiology;  Laterality: N/A;    Family History  Problem Relation Age of Onset  . Heart disease Mother   . Heart disease Father     Social History:  reports that she has quit smoking. She has never used smokeless tobacco. She reports that she does not drink alcohol or use drugs.    Allergies  Allergen Reactions  . Erythromycin Itching and Other (See Comments)    Severe stomach pains, diarrhea  . Heparin     Likely HIT, SRA negative   Medications Prior to Admission  Medication Sig Dispense Refill  . ALPRAZolam (XANAX) 0.5 MG tablet  Take 0.25 mg by mouth daily as needed for anxiety.     Marland Kitchen amLODipine (NORVASC) 5 MG tablet Take 5 mg by mouth daily.    Marland Kitchen aspirin 81 MG tablet Take 1 tablet (81 mg total) by mouth daily. 60 tablet 2  . atorvastatin (LIPITOR) 10 MG tablet Take 10 mg by mouth every evening.     . Cholecalciferol (VITAMIN D3) 2000 UNITS TABS Take 2,000 Units by mouth every evening.    Marland Kitchen levothyroxine (SYNTHROID, LEVOTHROID) 125 MCG tablet Take 125 mcg by mouth daily before breakfast.    . losartan (COZAAR) 100 MG tablet Take 100 mg by mouth every evening.    . Probiotic Product (ALIGN PO) Take 1 capsule by mouth daily.    . SYMBICORT 160-4.5 MCG/ACT inhaler Inhale 1 puff into the lungs daily as needed (shortness of breath).     . TRELEGY ELLIPTA 100-62.5-25 MCG/INH AEPB Inhale 1 puff into the lungs daily as needed (shortness of breath).       Drug Regimen Review  Drug regimen was reviewed and remains appropriate with no significant issues identified  Home: Home Living Family/patient expects to be discharged  to:: Private residence Living Arrangements: Spouse/significant other Available Help at Discharge: Family, Available 24 hours/day Type of Home: House Home Access: Stairs to enter [employer] of Steps: 2 Entrance Stairs-Rails: None Home Layout: Two level, Able to live on main level with bedroom/bathroom Bathroom Shower/Tub: Chiropodist: Standard Home Equipment: None  Lives With: Spouse   Functional History: Prior Function Level of Independence: Independent Comments: pt drives, does everything for self PTA  Functional Status:  Mobility: Bed Mobility Overal bed mobility: Needs Assistance Bed Mobility: Supine to Sit Rolling: Total assist, +2 for physical assistance Sidelying to sit: Min assist, HOB elevated(husband providing hand held assist) Supine to sit: Min assist, HOB elevated Sit to supine: Min assist, HOB elevated General bed mobility comments: Min hand held  assist to come to sitting EOB, min assist of one leg to return to bed.   Transfers Overall transfer level: Needs assistance Equipment used: Rolling walker (2 wheeled) Transfer via Lift Equipment: Stedy Transfers: Sit to/from Stand, Risk manager Sit to Stand: Min assist, Mod assist Stand pivot transfers: Min assist, Mod assist General transfer comment: Min assist for first attempt to get to [facility], mod assist for back to bed once done on [facility], stood extra time for pericare. Pt also reporting low back hurts after being on BSC.  Ambulation/Gait Ambulation/Gait assistance: Mod assist, +2 physical assistance(third person following with chair. ) Gait Distance (Feet): 30 Feet Assistive device: Rolling walker (2 wheeled) Gait Pattern/deviations: Step-through pattern, Drifts right/left, Staggering right General Gait Details: Did not attempt with only one assist today as her husband and son were not there.   Gait velocity: decreased Gait velocity interpretation: <1.8 ft/sec, indicate of risk for recurrent falls    ADL: ADL Overall ADL's : Needs assistance/impaired Eating/Feeding: Set up, Sitting Grooming: Total assistance, Brushing hair Grooming Details (indicate cue type and reason): limited d/t ideational apraxia Lower Body Bathing: Total assistance, Bed level Upper Body Dressing : Moderate assistance, Sitting Upper Body Dressing Details (indicate cue type and reason): to don gown as back side cover Lower Body Dressing: Total assistance, Bed level Lower Body Dressing Details (indicate cue type and reason): to don socks Toilet Transfer: Moderate assistance, +2 for physical assistance, Ambulation, RW Toilet Transfer Details (indicate cue type and reason): simulated via functional mobility; R lateral lean during ambulation with RW Toileting- Clothing Manipulation and Hygiene: Min guard Toileting - Clothing Manipulation Details (indicate cue type and reason): Safety Functional mobility  during ADLs: Moderate assistance, +2 for physical assistance, +2 for safety/equipment, Rolling walker General ADL Comments: pt able to complete functional mobility this session with RW and MOD A +2 d/t R lateral lean. Pt continues to present with cognitive impairments, generalized weakness and decreased strength impacting ability to participate in ADLs  Cognition: Cognition Overall Cognitive Status: Impaired/Different from baseline Orientation Level: Disoriented X4 Attention: Sustained Sustained Attention: Impaired Sustained Attention Impairment: Verbal complex Memory: Impaired Memory Impairment: Retrieval deficit(pt benefited from category cue for 3/5 items, multiple choice for 1/5 items and recalled one item without cue) Cognition Arousal/Alertness: Awake/alert Behavior During Therapy: Restless, Impulsive Overall Cognitive Status: Impaired/Different from baseline Area of Impairment: Orientation, Attention, Memory, Following commands, Safety/judgement, Awareness, Problem solving Orientation Level: Disoriented to, Time, Situation, Place, Person Current Attention Level: Sustained(short periods of sustained attention with structured environ) Memory: Decreased recall of precautions, Decreased short-term memory Following Commands: Follows one step commands with increased time, Follows one step commands inconsistently Safety/Judgement: Decreased awareness of safety, Decreased awareness of deficits Awareness: Intellectual Problem  Solving: Decreased initiation, Difficulty sequencing, Requires verbal cues, Requires tactile cues General Comments: Pt remains confused, unaware of her current situation, safety, picking at lines with mittens off.  Was able to preform peri care with sustained attention today.  Difficult to assess due to: Impaired communication   Blood pressure 127/64, pulse 95, temperature 98.8 F (37.1 C), temperature source Oral, resp. rate 17, height 5\' 2"  (1.575 m), weight 75.6  kg, SpO2 96 %. Physical Exam  Nursing note and vitals reviewed. Constitutional: She appears well-developed and well-nourished.  +NG  HENT:  Head: Normocephalic and atraumatic.  Right Ear: External ear normal.  Left Ear: External ear normal.  Eyes: Right eye exhibits no discharge. Left eye exhibits no discharge. No scleral icterus.  Neck: No tracheal deviation present. No thyromegaly present.  Respiratory: Effort normal. No stridor. No respiratory distress.  GI: Soft. She exhibits no distension.  Musculoskeletal:     Comments: No edema or tenderness in extremities  Neurological: She is alert.  Not following any commands, spontaneously moving b/l UE.  Skin: Skin is warm and dry.  Psychiatric:  Unable to assess due to mentation    Results for orders placed or performed during the hospital encounter of 02/13/20 (from the past 48 hour(s))  Vitamin B12     Status: None   Collection Time: 03/08/20  6:54 PM  Result Value Ref Range   Vitamin B-12 298 180 - 914 pg/mL    Comment: (NOTE) This assay is not validated for testing neonatal or myeloproliferative syndrome specimens for Vitamin B12 levels. Performed at [facility], [place] [address], [place], [place] [address]   Glucose, capillary     Status: None   Collection Time: 03/08/20  8:09 PM  Result Value Ref Range   Glucose-Capillary 90 70 - 99 mg/dL    Comment: Glucose reference range applies only to samples taken after fasting for at least 8 hours.  Glucose, capillary     Status: Abnormal   Collection Time: 03/09/20 12:16 AM  Result Value Ref Range   Glucose-Capillary 160 (H) 70 - 99 mg/dL    Comment: Glucose reference range applies only to samples taken after fasting for at least 8 hours.  Glucose, capillary     Status: Abnormal   Collection Time: 03/09/20  4:03 AM  Result Value Ref Range   Glucose-Capillary 204 (H) 70 - 99 mg/dL    Comment: Glucose reference range applies only to samples taken after fasting for at least  8 hours.  APTT     Status: Abnormal   Collection Time: 03/09/20  5:00 AM  Result Value Ref Range   aPTT 55 (H) 24 - 36 seconds    Comment:        IF BASELINE aPTT IS ELEVATED, SUGGEST PATIENT RISK ASSESSMENT BE USED TO DETERMINE APPROPRIATE ANTICOAGULANT THERAPY. Performed at [facility], [place] [address] [place], [place] [address]   Triglycerides     Status: None   Collection Time: 03/09/20  5:00 AM  Result Value Ref Range   Triglycerides 85 <150 mg/dL    Comment: Performed at [facility] [address], [place], [place] [address]  CBC with Differential     Status: Abnormal   Collection Time: 03/09/20  5:00 AM  Result Value Ref Range   WBC 13.1 (H) 4.0 - 10.5 K/uL   RBC 2.50 (L) 3.87 - 5.11 MIL/uL   Hemoglobin 7.7 (L) 12.0 - 15.0 g/dL   HCT 24.8 (L)  36.0 - 46.0 %   MCV 99.2 80.0 - 100.0 fL   MCH 30.8 26.0 - 34.0 pg   MCHC 31.0 30.0 - 36.0 g/dL   RDW 20.5 (H) 11.5 - 15.5 %   Platelets 223 150 - 400 K/uL    Comment: REPEATED TO VERIFY   nRBC 0.0 0.0 - 0.2 %   Neutrophils Relative % 87 %   Neutro Abs 11.4 (H) 1.7 - 7.7 K/uL   Lymphocytes Relative 5 %   Lymphs Abs 0.6 (L) 0.7 - 4.0 K/uL   Monocytes Relative 5 %   Monocytes Absolute 0.6 0.1 - 1.0 K/uL   Eosinophils Relative 0 %   Eosinophils Absolute 0.1 0.0 - 0.5 K/uL   Basophils Relative 0 %   Basophils Absolute 0.0 0.0 - 0.1 K/uL   Immature Granulocytes 3 %   Abs Immature Granulocytes 0.44 (H) 0.00 - 0.07 K/uL    Comment: Performed at [facility], [address] [place], [place] [id number]  Basic metabolic panel     Status: Abnormal   Collection Time: 03/09/20  5:00 AM  Result Value Ref Range   Sodium 140 135 - 145 mmol/L   Potassium 4.0 3.5 - 5.1 mmol/L   Chloride 107 98 - 111 mmol/L   CO2 24 22 - 32 mmol/L   Glucose, Bld 153 (H) 70 - 99 mg/dL    Comment: Glucose reference range applies only to samples taken after fasting for at least 8 hours.   BUN 30 (H) 8 - 23 mg/dL   Creatinine, Ser 0.83  0.44 - 1.00 mg/dL   Calcium 8.9 8.9 - 10.3 mg/dL   GFR calc non Af Amer >60 >60 mL/min   GFR calc Af Amer >60 >60 mL/min   Anion gap 9 5 - 15    Comment: Performed at [facility] [address] [place], [place] [address]  Reticulocytes     Status: Abnormal   Collection Time: 03/09/20  5:00 AM  Result Value Ref Range   Retic Ct Pct 4.6 (H) 0.4 - 3.1 %   RBC. 2.56 (L) 3.87 - 5.11 MIL/uL   Retic Count, Absolute 117.2 19.0 - 186.0 K/uL   Immature Retic Fract 20.2 (H) 2.3 - 15.9 %    Comment: Performed at [facility] [address], [place], [place] [address]  Folate     Status: None   Collection Time: 03/09/20  5:00 AM  Result Value Ref Range   Folate 8.7 >5.9 ng/mL    Comment: Performed at [facility], [place] [address] [place], [place] [address]  Iron and TIBC     Status: Abnormal   Collection Time: 03/09/20  5:00 AM  Result Value Ref Range   Iron 53 28 - 170 ug/dL   TIBC 231 (L) 250 - 450 ug/dL   Saturation Ratios 23 10.4 - 31.8 %   UIBC 178 ug/dL    Comment: Performed at [facility], [place] [address] [place], [place] [address]  Ferritin     Status: None   Collection Time: 03/09/20  5:00 AM  Result Value Ref Range   Ferritin 228 11 - 307 ng/mL    Comment: Performed at [facility], [place] [address], [place], [place] [address]  Glucose, capillary     Status: Abnormal   Collection Time: 03/09/20  8:00 AM  Result Value Ref Range   Glucose-Capillary 47 (L) 70 - 99 mg/dL  Comment: Glucose reference range applies only to samples taken after fasting for at least 8 hours.  Hepatitis C antibody     Status: None   Collection Time: 03/09/20  8:15 AM  Result Value Ref Range   HCV Ab NON REACTIVE NON REACTIVE    Comment: (NOTE) Nonreactive HCV antibody screen is consistent with no HCV infections,  unless recent infection is suspected or other evidence exists to indicate HCV infection. Performed at [facility], [place] [address], [place],  [place] [address]   Glucose, capillary     Status: Abnormal   Collection Time: 03/09/20  8:33 AM  Result Value Ref Range   Glucose-Capillary 140 (H) 70 - 99 mg/dL    Comment: Glucose reference range applies only to samples taken after fasting for at least 8 hours.  Glucose, capillary     Status: Abnormal   Collection Time: 03/09/20 11:50 AM  Result Value Ref Range   Glucose-Capillary 118 (H) 70 - 99 mg/dL    Comment: Glucose reference range applies only to samples taken after fasting for at least 8 hours.  Glucose, capillary     Status: Abnormal   Collection Time: 03/09/20  4:08 PM  Result Value Ref Range   Glucose-Capillary 167 (H) 70 - 99 mg/dL    Comment: Glucose reference range applies only to samples taken after fasting for at least 8 hours.  APTT     Status: Abnormal   Collection Time: 03/09/20  4:59 PM  Result Value Ref Range   aPTT 50 (H) 24 - 36 seconds    Comment:        IF BASELINE aPTT IS ELEVATED, SUGGEST PATIENT RISK ASSESSMENT BE USED TO DETERMINE APPROPRIATE ANTICOAGULANT THERAPY. Performed at [facility], [place] [address] [place], [place] [address]   Glucose, capillary     Status: Abnormal   Collection Time: 03/09/20  8:09 PM  Result Value Ref Range   Glucose-Capillary 160 (H) 70 - 99 mg/dL    Comment: Glucose reference range applies only to samples taken after fasting for at least 8 hours.   Comment 1 Notify RN    Comment 2 Document in Chart   Glucose, capillary     Status: Abnormal   Collection Time: 03/10/20 12:34 AM  Result Value Ref Range   Glucose-Capillary 118 (H) 70 - 99 mg/dL    Comment: Glucose reference range applies only to samples taken after fasting for at least 8 hours.   Comment 1 Notify RN    Comment 2 Document in Chart   Glucose, capillary     Status: Abnormal   Collection Time: 03/10/20  4:53 AM  Result Value Ref Range   Glucose-Capillary 179 (H) 70 - 99 mg/dL    Comment: Glucose reference range applies only to samples taken after  fasting for at least 8 hours.   Comment 1 Notify RN    Comment 2 Document in Chart   CBC     Status: Abnormal   Collection Time: 03/10/20  5:00 AM  Result Value Ref Range   WBC 12.2 (H) 4.0 - 10.5 K/uL   RBC 2.37 (L) 3.87 - 5.11 MIL/uL   Hemoglobin 7.4 (L) 12.0 - 15.0 g/dL   HCT 24.0 (L) 36.0 - 46.0 %   MCV 101.3 (H) 80.0 - 100.0 fL   MCH 31.2 26.0 - 34.0 pg   MCHC 30.8 30.0 - 36.0 g/dL   RDW 20.6 (H) 11.5 - 15.5 %   Platelets  268 150 - 400 K/uL    Comment: REPEATED TO VERIFY   nRBC 0.0 0.0 - 0.2 %    Comment: Performed at [name], [place] [address] [place], [place] [address]  APTT     Status: Abnormal   Collection Time: 03/10/20  5:00 AM  Result Value Ref Range   aPTT 52 (H) 24 - 36 seconds    Comment:        IF BASELINE aPTT IS ELEVATED, SUGGEST PATIENT RISK ASSESSMENT BE USED TO DETERMINE APPROPRIATE ANTICOAGULANT THERAPY. Performed at [facility], [place] [address] [place], [place] [id number]   Basic metabolic panel     Status: Abnormal   Collection Time: 03/10/20  5:00 AM  Result Value Ref Range   Sodium 139 135 - 145 mmol/L   Potassium 4.2 3.5 - 5.1 mmol/L   Chloride 107 98 - 111 mmol/L   CO2 24 22 - 32 mmol/L   Glucose, Bld 200 (H) 70 - 99 mg/dL    Comment: Glucose reference range applies only to samples taken after fasting for at least 8 hours.   BUN 31 (H) 8 - 23 mg/dL   Creatinine, Ser 0.83 0.44 - 1.00 mg/dL   Calcium 8.7 (L) 8.9 - 10.3 mg/dL   GFR calc non Af Amer >60 >60 mL/min   GFR calc Af Amer >60 >60 mL/min   Anion gap 8 5 - 15    Comment: Performed at [facility] [address], [place], [place] [address]  Lactate dehydrogenase     Status: Abnormal   Collection Time: 03/10/20  5:00 AM  Result Value Ref Range   LDH 410 (H) 98 - 192 U/L    Comment: Performed at [facility], [place] [address] [place], [place] [address]  Reticulocytes     Status: Abnormal   Collection Time: 03/10/20  5:00 AM  Result Value Ref Range   Retic Ct  Pct 5.5 (H) 0.4 - 3.1 %   RBC. 2.47 (L) 3.87 - 5.11 MIL/uL   Retic Count, Absolute 135.9 19.0 - 186.0 K/uL   Immature Retic Fract 21.0 (H) 2.3 - 15.9 %    Comment: Performed at [facility] [address], [place], [place] [address]  Hepatic function panel     Status: Abnormal   Collection Time: 03/10/20  5:00 AM  Result Value Ref Range   Total Protein 4.8 (L) 6.5 - 8.1 g/dL   Albumin 2.5 (L) 3.5 - 5.0 g/dL   AST 36 15 - 41 U/L   ALT 54 (H) 0 - 44 U/L   Alkaline Phosphatase 124 38 - 126 U/L   Total Bilirubin 0.6 0.3 - 1.2 mg/dL   Bilirubin, Direct 0.1 0.0 - 0.2 mg/dL   Indirect Bilirubin 0.5 0.3 - 0.9 mg/dL    Comment: Performed at [facility], [place] [address], [place], [place] [address]  Direct antiglobulin test     Status: None   Collection Time: 03/10/20  6:00 AM  Result Value Ref Range   DAT, complement NEG    DAT, IgG      NEG Performed at [facility] [address] [place], [place] [address]   Glucose, capillary     Status: Abnormal   Collection Time: 03/10/20  7:31 AM  Result Value Ref Range   Glucose-Capillary 157 (H) 70 - 99 mg/dL    Comment: Glucose reference range applies only to samples taken after  fasting for at least 8 hours.  Glucose, capillary     Status: Abnormal   Collection Time: 03/10/20 11:37 AM  Result Value Ref Range   Glucose-Capillary 109 (H) 70 - 99 mg/dL    Comment: Glucose reference range applies only to samples taken after fasting for at least 8 hours.  Glucose, capillary     Status: Abnormal   Collection Time: 03/10/20  3:39 PM  Result Value Ref Range   Glucose-Capillary 172 (H) 70 - 99 mg/dL    Comment: Glucose reference range applies only to samples taken after fasting for at least 8 hours.  APTT     Status: Abnormal   Collection Time: 03/10/20  3:53 PM  Result Value Ref Range   aPTT 52 (H) 24 - 36 seconds    Comment:        IF BASELINE aPTT IS ELEVATED, SUGGEST PATIENT RISK ASSESSMENT BE USED TO DETERMINE  APPROPRIATE ANTICOAGULANT THERAPY. Performed at [facility], [place] [address] [place], [place] [address]    DG Swallowing Func-Speech Pathology  Result Date: 03/09/2020 Objective Swallowing Evaluation: Type of Study: MBS-Modified Barium Swallow Study  Patient Details Name: CHENELLE ELFERS MRN: [id number] Date of Birth: [birth year] Today's Date: 03/09/2020 Time: SLP Start Time (ACUTE ONLY): 1102 -SLP Stop Time (ACUTE ONLY): 1116 SLP Time Calculation (min) (ACUTE ONLY): 14 min Past Medical History: Past Medical History: Diagnosis Date . Hepatitis C  . Hyperlipidemia  . Hypertension  . Osteoporosis  Past Surgical History: Past Surgical History: Procedure Laterality Date . IR ANGIO INTRA EXTRACRAN SEL INTERNAL CAROTID BILAT MOD SED  02/24/2020 . IR ANGIO VERTEBRAL SEL VERTEBRAL UNI R MOD SED  02/24/2020 . IR ANGIOGRAM SELECTIVE EACH ADDITIONAL VESSEL  02/24/2020 . IR IVC FILTER PLMT / S&I /IMG GUID/MOD SED  02/22/2020 . IR THROMBECT VENO MECH MOD SED  02/24/2020 . IR US GUIDE VASC ACCESS RIGHT  02/24/2020 . IR US GUIDE VASC ACCESS RIGHT  02/24/2020 . IR VENO SAGITTAL SINUS  02/24/2020 . IR VENO/JUGULAR RIGHT  02/24/2020 . RADIOLOGY WITH ANESTHESIA N/A 02/24/2020  Procedure: IR WITH ANESTHESIA;  Surgeon: Radiologist, Medication, MD;  Location: [place];  Service: Radiology;  Laterality: N/A; HPI: 74 y.o. female with history of HTN presenting with severe persistent HA accompanied by nausea and vomiting. Dx right lateral IVH. Developed acute PE; IVC filter placed 4/13. 4/15 head CT demonstrated deep cerebral thrombosis s/p IR for thrombectomy with expressive aphasia after. 4/16 MRI showed multiple cerebral and cerebellar infarcts, Rt temporal infarct with cytotoxic edema and hypertonic saline started.  Subjective: alert, restless Assessment / Plan / Recommendation CHL IP CLINICAL IMPRESSIONS 03/09/2020 Clinical Impression Pt's oropharyngeal dysphagia appears to be largely cognitive in nature, although testing was also very  limited as she began to refuse all POs after only a few trials. She has mildly slow posterior propulsion with purees, leaving minimal lingual residue on her tongue. Pharyngeally she has no overt weakness, but she did aspirate thin liquids in the setting of premature spillage from her oral cavity before the swallow. This elicited a strong cough response, but visibility below her vocal cords was limited and therefore unable to tell if aspirates were cleared. She had a delayed swallow with nectar thick liquids, suspect in part due to small bolus size but more so because pt was so heavily distracted in that moment. Recommend starting a trial of Dys 1 (puree) diet and nectar thick liquids, with full supervision being important given her mentation and the limited results  from this study. Given her repeated pattern of refusing POs, I think it may be challenging to meet her nutritional needs strictly PO until we see more cognitive-linguistic improvements. Will continue to follow. SLP Visit Diagnosis Dysphagia, oropharyngeal phase (R13.12) Attention and concentration deficit following -- Frontal lobe and executive function deficit following -- Impact on safety and function Moderate aspiration risk;Risk for inadequate nutrition/hydration   CHL IP TREATMENT RECOMMENDATION 03/09/2020 Treatment Recommendations Therapy as outlined in treatment plan below   Prognosis 03/09/2020 Prognosis for Safe Diet Advancement Fair Barriers to Reach Goals Cognitive deficits;Severity of deficits Barriers/Prognosis Comment -- CHL IP DIET RECOMMENDATION 03/09/2020 SLP Diet Recommendations Dysphagia 1 (Puree) solids;Nectar thick liquid Liquid Administration via Cup;Straw Medication Administration Crushed with puree Compensations Slow rate;Small sips/bites Postural Changes Seated upright at 90 degrees   CHL IP OTHER RECOMMENDATIONS 03/09/2020 Recommended Consults -- Oral Care Recommendations Oral care BID Other Recommendations Order thickener from  pharmacy;Prohibited food (jello, ice cream, thin soups);Remove water pitcher;Have oral suction available   CHL IP FOLLOW UP RECOMMENDATIONS 03/09/2020 Follow up Recommendations Inpatient Rehab   CHL IP FREQUENCY AND DURATION 03/09/2020 Speech Therapy Frequency (ACUTE ONLY) min 2x/week Treatment Duration 2 weeks      CHL IP ORAL PHASE 03/09/2020 Oral Phase Impaired Oral - Pudding Teaspoon -- Oral - Pudding Cup -- Oral - Honey Teaspoon -- Oral - Honey Cup -- Oral - Nectar Teaspoon -- Oral - Nectar Cup -- Oral - Nectar Straw Premature spillage Oral - Thin Teaspoon -- Oral - Thin Cup -- Oral - Thin Straw Premature spillage Oral - Puree Premature spillage;Delayed oral transit Oral - Mech Soft -- Oral - Regular -- Oral - Multi-Consistency -- Oral - Pill -- Oral Phase - Comment --  CHL IP PHARYNGEAL PHASE 03/09/2020 Pharyngeal Phase Impaired Pharyngeal- Pudding Teaspoon -- Pharyngeal -- Pharyngeal- Pudding Cup -- Pharyngeal -- Pharyngeal- Honey Teaspoon -- Pharyngeal -- Pharyngeal- Honey Cup -- Pharyngeal -- Pharyngeal- Nectar Teaspoon -- Pharyngeal -- Pharyngeal- Nectar Cup -- Pharyngeal -- Pharyngeal- Nectar Straw Delayed swallow initiation-pyriform sinuses Pharyngeal -- Pharyngeal- Thin Teaspoon -- Pharyngeal -- Pharyngeal- Thin Cup -- Pharyngeal -- Pharyngeal- Thin Straw Penetration/Aspiration before swallow Pharyngeal Material enters airway, passes BELOW cords and not ejected out despite cough attempt by patient;Material enters airway, passes BELOW cords then ejected out Pharyngeal- Puree WFL Pharyngeal -- Pharyngeal- Mechanical Soft -- Pharyngeal -- Pharyngeal- Regular -- Pharyngeal -- Pharyngeal- Multi-consistency -- Pharyngeal -- Pharyngeal- Pill -- Pharyngeal -- Pharyngeal Comment --  CHL IP CERVICAL ESOPHAGEAL PHASE 03/09/2020 Cervical Esophageal Phase WFL Pudding Teaspoon -- Pudding Cup -- Honey Teaspoon -- Honey Cup -- Nectar Teaspoon -- Nectar Cup -- Nectar Straw -- Thin Teaspoon -- Thin Cup -- Thin Straw -- Puree  -- Mechanical Soft -- Regular -- Multi-consistency -- Pill -- Cervical Esophageal Comment -- Osie Bond., M.A. [facility] Pager [phone] Office [phone] 03/09/2020, 12:57 PM                  Medical Problem List and Plan: 1.  Deficits with mobility, self-care, cognition secondary to R>L brain bleeds.  -patient may shower  -ELOS/Goals: 18-22 days/Supervision/Min A  Admit to CIR 2.  PE/Antithrombotics: -anticoagulation:  Pharmaceutical: Other (comment)On Argatroban as pradaxa cannot be crushed.    -antiplatelet therapy: N/A 3. Heaches/Pain Management: On depakote tid for agitation as well as HA.    Monitor with increase stimulation 4. Mood: LCSW to follow for evaluation and support.   -antipsychotic agents: N/A 5. Neuropsych: This patient is not capable of  making decisions on her own behalf. 6. Skin/Wound Care: Routine pressure relief measures.  7. Fluids/Electrolytes/Nutrition: Monitor I/O.--now on dysphagia 1, nectar liquids. Intake poor--will keep tube feeds for now.   Advance diet as tolerated 8. 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 Decadron tapered to 4 mg bid on 04/27--slow taper to off.   9. Polycythemia Vera: JAK2 mutation positive. S/p Leukapheresis X 3. Hydrea prn to keep HCT<45     10. 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 Thrombocytopenia resolving.     CBC ordered 11. PAF/A fib with RVR:  Felt to be due to illness . Current recommendations are for [facility] for 6-9 months.   Monitor with increased activity 12. Prediabetes: Hgb A1c-6.1. Monitor BS every 4 hours with 5 units for coverage as well as SSI for elevated BS  Monitor with increased mobility 13. Delirium:  With sundowning. On amantadine for activation and Depakote for agitation. Low dose Seroquel resumed 4/29 pm.   14. Anemia: Continue to monitor with daily labs.  H/H down to 7.4 today.   CBC ordered  Bary Leriche, PA-C 03/10/2020  I have personally performed a face to face diagnostic evaluation, including, but not limited to relevant history and physical exam findings, of this patient and developed relevant assessment and plan.  Additionally, I have reviewed and concur with the physician assistant's documentation above.  Delice Lesch, MD, ABPMR

## 2020-03-10 NOTE — Progress Notes (Signed)
Physical Therapy Treatment Patient Details Name: [name] MRN: [id number] DOB: 1945/11/29 Today's Date: 03/10/2020    History of Present Illness 74 yo female presented with persistent HA, nausea and vomiting pt with stable R lateral ventricle IVH. Pt with acute bil PE 4/13, IVC filter placed 4/14. 4/15 head CT demonstrated deep cerebral thrombosis s/p IR for thrombectomy with expressive aphasia after. 4/16 MRI showed multiple cerebral and cerebellar infarcts, Rt temporal infarct with cytotoxic edema and hypertonic saline started.  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 PMHx: HTN, HLD, hep C, hypothyroidism    PT Comments    Pt restless in bed, transferred to [facility] with min to mod assist and participated in her own peri care today.  She continues to have cognitive deficits and is safer with two person assist for gait.  Son/husband were not in room today, so I did not feel safe as restless as she is getting her up to the recliner chair unsupervised. She remains highly appropriate for CIR level therapies at discharge.  PT will continue to follow acutely for safe mobility progression.   Follow Up Recommendations  CIR     Equipment Recommendations  Rolling walker with 5" wheels;3in1 (PT);Wheelchair (measurements PT);Wheelchair cushion (measurements PT)    Recommendations for Other Services   NA     Precautions / Restrictions Precautions Precautions: Fall Precaution Comments: expressive/receptive difficulties Leans R    Mobility  Bed Mobility Overal bed mobility: Needs Assistance Bed Mobility: Supine to Sit     Supine to sit: Min assist;HOB elevated Sit to supine: Min assist;HOB elevated   General bed mobility comments: Min hand held assist to come to sitting EOB, min assist of one leg to return to bed.    Transfers Overall transfer level: Needs assistance Equipment used: Rolling walker (2 wheeled) Transfers: Sit to/from [facility] Sit to Stand:  Min assist;Mod assist Stand pivot transfers: Min assist;Mod assist       General transfer comment: Min assist for first attempt to get to [facility], mod assist for back to bed once done on [facility], stood extra time for pericare. Pt also reporting low back hurts after being on BSC.   Ambulation/Gait             General Gait Details: Did not attempt with only one assist today as her husband and son were not there.         Modified Rankin (Stroke Patients Only) Modified Rankin (Stroke Patients Only) Pre-Morbid Rankin Score: No symptoms Modified Rankin: Moderately severe disability     Balance Overall balance assessment: Needs assistance Sitting-balance support: Feet supported;Bilateral upper extremity supported Sitting balance-Leahy Scale: Fair Sitting balance - Comments: close supervision EOB.  Postural control: Right lateral lean Standing balance support: Bilateral upper extremity supported Standing balance-Leahy Scale: Poor Standing balance comment: needs external support from RW and therapist in standing.                             Cognition Arousal/Alertness: Awake/alert Behavior During Therapy: Restless;Impulsive Overall Cognitive Status: Impaired/Different from baseline Area of Impairment: Orientation;Attention;Memory;Following commands;Safety/judgement;Awareness;Problem solving                 Orientation Level: Disoriented to;Time;Situation;Place;Person Current Attention Level: Sustained(short periods of sustained attention with structured environ) Memory: Decreased recall of precautions;Decreased short-term memory Following Commands: Follows one step commands with increased time;Follows one step commands inconsistently Safety/Judgement: Decreased awareness of safety;Decreased awareness of deficits  Awareness: Intellectual Problem Solving: Decreased initiation;Difficulty sequencing;Requires verbal cues;Requires tactile cues General Comments: Pt remains  confused, unaware of her current situation, safety, picking at lines with mittens off.  Was able to preform peri care with sustained attention today.          General Comments General comments (skin integrity, edema, etc.): Pt incontinent of urine, when asked if she still needed to go to the bathroom she says "no", but then urinated and had a BM on the [facility].        Pertinent Vitals/Pain Pain Assessment: Faces Faces Pain Scale: Hurts even more Pain Location: low back, difficult to tell if it is a chronic issue when asked.  Pain Descriptors / Indicators: Grimacing;Moaning Pain Intervention(s): Limited activity within patient's tolerance;Monitored during session;Repositioned           PT Goals (current goals can now be found in the care plan section) Acute Rehab PT Goals Patient Stated Goal: unable to state clearly Progress towards PT goals: Progressing toward goals    Frequency    Min 4X/week      PT Plan Current plan remains appropriate       AM-PAC PT "6 Clicks" Mobility   Outcome Measure  Help needed turning from your back to your side while in a flat bed without using bedrails?: A Little Help needed moving from lying on your back to sitting on the side of a flat bed without using bedrails?: A Little Help needed moving to and from a bed to a chair (including a wheelchair)?: A Lot Help needed standing up from a chair using your arms (e.g., wheelchair or bedside chair)?: A Lot Help needed to walk in hospital room?: A Lot Help needed climbing 3-5 steps with a railing? : Total 6 Click Score: 13    End of Session   Activity Tolerance: Patient limited by pain Patient left: in bed;with call bell/phone within reach;with bed alarm set   PT Visit Diagnosis: Other abnormalities of gait and mobility (R26.89);Other symptoms and signs involving the nervous system (R29.898) Pain - Right/Left: (lower) Pain - part of body: (back)     Time: [id number] PT Time Calculation (min)  (ACUTE ONLY): 34 min  Charges:  $Therapeutic Activity: 23-37 mins                    Verdene Lennert, PT, DPT  Acute Rehabilitation [phone] pager #[phone] office     03/10/2020, 1:51 PM

## 2020-03-10 NOTE — Progress Notes (Signed)
  Speech Language Pathology Treatment: Dysphagia;Cognitive-Linquistic  Patient Details Name: [name] MRN: [id number] DOB: 01-23-1946 Today's Date: 03/10/2020 Time: [id number] SLP Time Calculation (min) (ACUTE ONLY): 27 min  Assessment / Plan / Recommendation Clinical Impression  Pt is more easily redirected today in an environment that is quieter, although she is still distracted by her mitts; improved after removal for session. She still does not want much PO intake, but took a few bites of puree and a few cup sips and spoonfuls of nectar thick liquids. Pt would say she did not want anymore POs via comments like "that's enough" or "that's too much" but when given a brief break, she would at least take another bite or two. This still makes it challenging to meet nutritional needs, but there were at least no signs of aspiration observed. Would recommend offering PO trials as often as possible to try to maximize intake.   Pt is becoming more fluent with verbal expression, using learned phrases but also producing some appropriate short phrases. She gave SLP an appropriate social greeting upon arrival and departure, but could not complete confrontational naming or repetition tasks. SLP played Ophelia Shoulder songs, which she reportedly likes, with brief tapping to music x2, sustained for a few seconds at a time. The music seemed to calm her overall even though I could not get her to sing or hum along. She will continue to benefit from SLP services.   HPI HPI: 74 y.o. female with history of HTN presenting with severe persistent HA accompanied by nausea and vomiting. Dx right lateral IVH. Developed acute PE; IVC filter placed 4/13. 4/15 head CT demonstrated deep cerebral thrombosis s/p IR for thrombectomy with expressive aphasia after. 4/16 MRI showed multiple cerebral and cerebellar infarcts, Rt temporal infarct with cytotoxic edema and hypertonic saline started.      SLP Plan  Continue with current  plan of care       Recommendations  Diet recommendations: Dysphagia 1 (puree);Nectar-thick liquid Liquids provided via: Cup;Straw Medication Administration: Crushed with puree Supervision: Staff to assist with self feeding;Full supervision/cueing for compensatory strategies Compensations: Slow rate;Small sips/bites Postural Changes and/or Swallow Maneuvers: Seated upright 90 degrees                Oral Care Recommendations: Oral care BID Follow up Recommendations: Inpatient Rehab SLP Visit Diagnosis: Dysphagia, oropharyngeal phase (R13.12);Aphasia (R47.01) Plan: Continue with current plan of care       GO                 Osie Bond., M.A. [facility] Pager [phone] Office [phone]  03/10/2020, 12:40 PM

## 2020-03-10 NOTE — H&P (Signed)
Physical Medicine and Rehabilitation Admission H&P    CC: Stroke with functional deficits.    HPI: [name]. [name] is a 74 year old female with history of HTN, Hep C who was admitted on 02/14/20 with onset of severe persistent HA and N/V.  History taken from chart review due to cognition. CT head done revealing acute IVH in right lateral ventricle and severe chronic ischemic microangiopathy.  CBC revealed thrombocytosis with platelets 990, Hgb 17.7 and WBC 12.0.  Neurology question of bleed was HTN versus CVA related.  Dr. Jana Hakim was consulted for input and recommended bone biopsy as well as leukopheresis for work-up of panmyelosis felt to be due to polycytopenia vera.  2D echo showing EF 65-70%.  She was placed on low-dose Cleviprex and was started on pheresis with improvement in cell counts.  Bone biopsy was positive for JAK2 confirming polycythemia vera. She did not meet criteria for von Willebrand disease with borderline low ristocetin cofacto and to be repeated once patient in remission.  She developed lethargy on 04/05 with repeat CT showing IVH without change.  MRI/MRA brain ordered for work-up and showed stable bleed with progression of extensive confluent hyperintensity throughout the brain question due to microangiopathy versus amyloid angiopathy.  She completed leukapheresis x3 with plans for DCCV  but had worsening of headache on 4/10.  As hemoglobin was up to 17.3, she underwent phlebotomy of 1 units and CTA head done showing patent vessels but suboptimal evaluation due to artifact.  EEG done for work-up and showed moderate diffuse encephalopathy and was negative for seizures.  Depakote was added with some improvement in headaches.  She did develop SOB with intermittent tachycardia and CTA chest done showing acute BLL emboli and emphysematous change in BUL.  BLE Dopplers negative for DVT.  IVC filter was placed by Dr. Anselm Pancoast on 04/13 due to [place].  She did develop A. fib with RVR with short  runs of NSVT on 04/14.  Cardiology was consulted for input and recommended IV metoprolol as well as question of cardioversion due to hypotension.  She was transferred to ICU for monitoring and IV amiodarone added to controlled recurrent Atrial flutter.  She did develop increasing headaches with drop in platelets less than 2400.  Work-up was positive for HIT and and IV Solu-Medrol was added.  CT head was repeated on 04/14 showing hyperdensity and straight sinus and follow-up CT venogram revealed extensive thrombus within confluence of sinuses and straight sinus extending into the vein of Galan and fairly extensive multifocal thrombus within right greater than left transverse and sigmoid dural venous sinuses.  She underwent emergent thrombectomy with recanalization of deep system and decrease in clot burden. Follow-up MRI brain on 04/16 showed numerous tiny acute infarcts in bilateral cerebral and cerebellar hemispheres felt to be embolic, 2.3 cm acute cortical infarct right temporal and occipital lobes, new region of parenchymal swelling left frontal lobe and frontal operculum and unchanged subacute IVH without hydrocephalus.    She was started on hypertonic saline for edema control and case was discussed with Dr. Jana Hakim.  He recommended lower therapeutic range of argatroban for treatment.  She was also started on IV steroids, IV Solu-Medrol 500 mg x 3 days and has been tapered to Decadron.  Cognition has fluctuated between lethargy and bouts of agitation.   She did develop drug-induced thrombocytopenia felt to be related to amiodarone which was discontinued as heart rate was stable.  CT abdomen pelvis was ordered due to ongoing issues with  restlessness and showed generalized body wall edema with mild retroperitoneal edema but no other acute abnormality. Hyperglycemia due to steroids being managed with sliding scale insulin.  She continues on tube feeds for nutritional support MBS performed 4/29 and testing was  limited as patient refused all p.o.'s and oropharyngeal dysphagia appeared to be cognitive in nature and aspiration of thins noted. She was started on dysphagia 1. nectar liquids Dr. Jana Hakim recommends argatroban until able to tolerate p.o.'s at which time she could be switched to [place]. Sedating medications which were discontinued and amantadine added to help with activation. Depakote resumed 4/26 to help manage HA is slowly being added back to help manage headaches and agitation. Please see preadmission assessment from today as well.   Review of Systems  Unable to perform ROS: Mental acuity    Past Medical History:  Diagnosis Date  . Hepatitis C   . Hyperlipidemia   . Hypertension   . Osteoporosis     Past Surgical History:  Procedure Laterality Date  . IR ANGIO INTRA EXTRACRAN SEL INTERNAL CAROTID BILAT MOD SED  02/24/2020  . IR ANGIO VERTEBRAL SEL VERTEBRAL UNI R MOD SED  02/24/2020  . IR ANGIOGRAM SELECTIVE EACH ADDITIONAL VESSEL  02/24/2020  . IR IVC FILTER PLMT / S&I /IMG GUID/MOD SED  02/22/2020  . IR THROMBECT VENO MECH MOD SED  02/24/2020  . IR US GUIDE VASC ACCESS RIGHT  02/24/2020  . IR US GUIDE VASC ACCESS RIGHT  02/24/2020  . IR VENO SAGITTAL SINUS  02/24/2020  . IR VENO/JUGULAR RIGHT  02/24/2020  . RADIOLOGY WITH ANESTHESIA N/A 02/24/2020   Procedure: IR WITH ANESTHESIA;  Surgeon: Radiologist, Medication, MD;  Location: [facility];  Service: Radiology;  Laterality: N/A;    Family History  Problem Relation Age of Onset  . Heart disease Mother   . Heart disease Father     Social History:  reports that she has quit smoking. She has never used smokeless tobacco. She reports that she does not drink alcohol or use drugs.    Allergies  Allergen Reactions  . Erythromycin Itching and Other (See Comments)    Severe stomach pains, diarrhea  . Heparin     Likely HIT, SRA negative   Medications Prior to Admission  Medication Sig Dispense Refill  . ALPRAZolam (XANAX) 0.5 MG tablet  Take 0.25 mg by mouth daily as needed for anxiety.     Marland Kitchen amLODipine (NORVASC) 5 MG tablet Take 5 mg by mouth daily.    Marland Kitchen aspirin 81 MG tablet Take 1 tablet (81 mg total) by mouth daily. 60 tablet 2  . atorvastatin (LIPITOR) 10 MG tablet Take 10 mg by mouth every evening.     . Cholecalciferol (VITAMIN D3) 2000 UNITS TABS Take 2,000 Units by mouth every evening.    Marland Kitchen levothyroxine (SYNTHROID, LEVOTHROID) 125 MCG tablet Take 125 mcg by mouth daily before breakfast.    . losartan (COZAAR) 100 MG tablet Take 100 mg by mouth every evening.    . Probiotic Product (ALIGN PO) Take 1 capsule by mouth daily.    . SYMBICORT 160-4.5 MCG/ACT inhaler Inhale 1 puff into the lungs daily as needed (shortness of breath).     . TRELEGY ELLIPTA 100-62.5-25 MCG/INH AEPB Inhale 1 puff into the lungs daily as needed (shortness of breath).       Drug Regimen Review  Drug regimen was reviewed and remains appropriate with no significant issues identified  Home: Home Living Family/patient expects to be discharged  to:: Private residence Living Arrangements: Spouse/significant other Available Help at Discharge: Family, Available 24 hours/day Type of Home: House Home Access: Stairs to enter [employer] of Steps: 2 Entrance Stairs-Rails: None Home Layout: Two level, Able to live on main level with bedroom/bathroom Bathroom Shower/Tub: Chiropodist: Standard Home Equipment: None  Lives With: Spouse   Functional History: Prior Function Level of Independence: Independent Comments: pt drives, does everything for self PTA  Functional Status:  Mobility: Bed Mobility Overal bed mobility: Needs Assistance Bed Mobility: Supine to Sit Rolling: Total assist, +2 for physical assistance Sidelying to sit: Min assist, HOB elevated(husband providing hand held assist) Supine to sit: Min assist, HOB elevated Sit to supine: Min assist, HOB elevated General bed mobility comments: Min hand held  assist to come to sitting EOB, min assist of one leg to return to bed.   Transfers Overall transfer level: Needs assistance Equipment used: Rolling walker (2 wheeled) Transfer via Lift Equipment: Stedy Transfers: Sit to/from Stand, Risk manager Sit to Stand: Min assist, Mod assist Stand pivot transfers: Min assist, Mod assist General transfer comment: Min assist for first attempt to get to [facility], mod assist for back to bed once done on [facility], stood extra time for pericare. Pt also reporting low back hurts after being on BSC.  Ambulation/Gait Ambulation/Gait assistance: Mod assist, +2 physical assistance(third person following with chair. ) Gait Distance (Feet): 30 Feet Assistive device: Rolling walker (2 wheeled) Gait Pattern/deviations: Step-through pattern, Drifts right/left, Staggering right General Gait Details: Did not attempt with only one assist today as her husband and son were not there.   Gait velocity: decreased Gait velocity interpretation: <1.8 ft/sec, indicate of risk for recurrent falls    ADL: ADL Overall ADL's : Needs assistance/impaired Eating/Feeding: Set up, Sitting Grooming: Total assistance, Brushing hair Grooming Details (indicate cue type and reason): limited d/t ideational apraxia Lower Body Bathing: Total assistance, Bed level Upper Body Dressing : Moderate assistance, Sitting Upper Body Dressing Details (indicate cue type and reason): to don gown as back side cover Lower Body Dressing: Total assistance, Bed level Lower Body Dressing Details (indicate cue type and reason): to don socks Toilet Transfer: Moderate assistance, +2 for physical assistance, Ambulation, RW Toilet Transfer Details (indicate cue type and reason): simulated via functional mobility; R lateral lean during ambulation with RW Toileting- Clothing Manipulation and Hygiene: Min guard Toileting - Clothing Manipulation Details (indicate cue type and reason): Safety Functional mobility  during ADLs: Moderate assistance, +2 for physical assistance, +2 for safety/equipment, Rolling walker General ADL Comments: pt able to complete functional mobility this session with RW and MOD A +2 d/t R lateral lean. Pt continues to present with cognitive impairments, generalized weakness and decreased strength impacting ability to participate in ADLs  Cognition: Cognition Overall Cognitive Status: Impaired/Different from baseline Orientation Level: Disoriented X4 Attention: Sustained Sustained Attention: Impaired Sustained Attention Impairment: Verbal complex Memory: Impaired Memory Impairment: Retrieval deficit(pt benefited from category cue for 3/5 items, multiple choice for 1/5 items and recalled one item without cue) Cognition Arousal/Alertness: Awake/alert Behavior During Therapy: Restless, Impulsive Overall Cognitive Status: Impaired/Different from baseline Area of Impairment: Orientation, Attention, Memory, Following commands, Safety/judgement, Awareness, Problem solving Orientation Level: Disoriented to, Time, Situation, Place, Person Current Attention Level: Sustained(short periods of sustained attention with structured environ) Memory: Decreased recall of precautions, Decreased short-term memory Following Commands: Follows one step commands with increased time, Follows one step commands inconsistently Safety/Judgement: Decreased awareness of safety, Decreased awareness of deficits Awareness: Intellectual Problem  Solving: Decreased initiation, Difficulty sequencing, Requires verbal cues, Requires tactile cues General Comments: Pt remains confused, unaware of her current situation, safety, picking at lines with mittens off.  Was able to preform peri care with sustained attention today.  Difficult to assess due to: Impaired communication   Blood pressure 127/64, pulse 95, temperature 98.8 F (37.1 C), temperature source Oral, resp. rate 17, height 5\' 2"  (1.575 m), weight 75.6  kg, SpO2 96 %. Physical Exam  Nursing note and vitals reviewed. Constitutional: She appears well-developed and well-nourished.  +NG  HENT:  Head: Normocephalic and atraumatic.  Right Ear: External ear normal.  Left Ear: External ear normal.  Eyes: Right eye exhibits no discharge. Left eye exhibits no discharge. No scleral icterus.  Neck: No tracheal deviation present. No thyromegaly present.  Respiratory: Effort normal. No stridor. No respiratory distress.  GI: Soft. She exhibits no distension.  Musculoskeletal:     Comments: No edema or tenderness in extremities  Neurological: She is alert.  Not following any commands, spontaneously moving b/l UE.  Skin: Skin is warm and dry.  Psychiatric:  Unable to assess due to mentation    Results for orders placed or performed during the hospital encounter of 02/13/20 (from the past 48 hour(s))  Vitamin B12     Status: None   Collection Time: 03/08/20  6:54 PM  Result Value Ref Range   Vitamin B-12 298 180 - 914 pg/mL    Comment: (NOTE) This assay is not validated for testing neonatal or myeloproliferative syndrome specimens for Vitamin B12 levels. Performed at [facility], [place] [address], [place], [place] [address]   Glucose, capillary     Status: None   Collection Time: 03/08/20  8:09 PM  Result Value Ref Range   Glucose-Capillary 90 70 - 99 mg/dL    Comment: Glucose reference range applies only to samples taken after fasting for at least 8 hours.  Glucose, capillary     Status: Abnormal   Collection Time: 03/09/20 12:16 AM  Result Value Ref Range   Glucose-Capillary 160 (H) 70 - 99 mg/dL    Comment: Glucose reference range applies only to samples taken after fasting for at least 8 hours.  Glucose, capillary     Status: Abnormal   Collection Time: 03/09/20  4:03 AM  Result Value Ref Range   Glucose-Capillary 204 (H) 70 - 99 mg/dL    Comment: Glucose reference range applies only to samples taken after fasting for at least  8 hours.  APTT     Status: Abnormal   Collection Time: 03/09/20  5:00 AM  Result Value Ref Range   aPTT 55 (H) 24 - 36 seconds    Comment:        IF BASELINE aPTT IS ELEVATED, SUGGEST PATIENT RISK ASSESSMENT BE USED TO DETERMINE APPROPRIATE ANTICOAGULANT THERAPY. Performed at [facility], [place] [address] [place], [place] [address]   Triglycerides     Status: None   Collection Time: 03/09/20  5:00 AM  Result Value Ref Range   Triglycerides 85 <150 mg/dL    Comment: Performed at [facility] [address] [place], [place] [address]  CBC with Differential     Status: Abnormal   Collection Time: 03/09/20  5:00 AM  Result Value Ref Range   WBC 13.1 (H) 4.0 - 10.5 K/uL   RBC 2.50 (L) 3.87 - 5.11 MIL/uL   Hemoglobin 7.7 (L) 12.0 - 15.0 g/dL   HCT 24.8 (L)  36.0 - 46.0 %   MCV 99.2 80.0 - 100.0 fL   MCH 30.8 26.0 - 34.0 pg   MCHC 31.0 30.0 - 36.0 g/dL   RDW 20.5 (H) 11.5 - 15.5 %   Platelets 223 150 - 400 K/uL    Comment: REPEATED TO VERIFY   nRBC 0.0 0.0 - 0.2 %   Neutrophils Relative % 87 %   Neutro Abs 11.4 (H) 1.7 - 7.7 K/uL   Lymphocytes Relative 5 %   Lymphs Abs 0.6 (L) 0.7 - 4.0 K/uL   Monocytes Relative 5 %   Monocytes Absolute 0.6 0.1 - 1.0 K/uL   Eosinophils Relative 0 %   Eosinophils Absolute 0.1 0.0 - 0.5 K/uL   Basophils Relative 0 %   Basophils Absolute 0.0 0.0 - 0.1 K/uL   Immature Granulocytes 3 %   Abs Immature Granulocytes 0.44 (H) 0.00 - 0.07 K/uL    Comment: Performed at [facility], [address], [place], [place] [id number]  Basic metabolic panel     Status: Abnormal   Collection Time: 03/09/20  5:00 AM  Result Value Ref Range   Sodium 140 135 - 145 mmol/L   Potassium 4.0 3.5 - 5.1 mmol/L   Chloride 107 98 - 111 mmol/L   CO2 24 22 - 32 mmol/L   Glucose, Bld 153 (H) 70 - 99 mg/dL    Comment: Glucose reference range applies only to samples taken after fasting for at least 8 hours.   BUN 30 (H) 8 - 23 mg/dL   Creatinine, Ser 0.83  0.44 - 1.00 mg/dL   Calcium 8.9 8.9 - 10.3 mg/dL   GFR calc non Af Amer >60 >60 mL/min   GFR calc Af Amer >60 >60 mL/min   Anion gap 9 5 - 15    Comment: Performed at [facility] [address], [place], [place] [address]  Reticulocytes     Status: Abnormal   Collection Time: 03/09/20  5:00 AM  Result Value Ref Range   Retic Ct Pct 4.6 (H) 0.4 - 3.1 %   RBC. 2.56 (L) 3.87 - 5.11 MIL/uL   Retic Count, Absolute 117.2 19.0 - 186.0 K/uL   Immature Retic Fract 20.2 (H) 2.3 - 15.9 %    Comment: Performed at [facility] [address] [place], [place] [address]  Folate     Status: None   Collection Time: 03/09/20  5:00 AM  Result Value Ref Range   Folate 8.7 >5.9 ng/mL    Comment: Performed at [facility], [place] [address] [place], [place] [address]  Iron and TIBC     Status: Abnormal   Collection Time: 03/09/20  5:00 AM  Result Value Ref Range   Iron 53 28 - 170 ug/dL   TIBC 231 (L) 250 - 450 ug/dL   Saturation Ratios 23 10.4 - 31.8 %   UIBC 178 ug/dL    Comment: Performed at [facility], [place] [address], [place], [place] [address]  Ferritin     Status: None   Collection Time: 03/09/20  5:00 AM  Result Value Ref Range   Ferritin 228 11 - 307 ng/mL    Comment: Performed at [facility], [place] [address], [place], [place] [address]  Glucose, capillary     Status: Abnormal   Collection Time: 03/09/20  8:00 AM  Result Value Ref Range   Glucose-Capillary 47 (L) 70 - 99 mg/dL  Comment: Glucose reference range applies only to samples taken after fasting for at least 8 hours.  Hepatitis C antibody     Status: None   Collection Time: 03/09/20  8:15 AM  Result Value Ref Range   HCV Ab NON REACTIVE NON REACTIVE    Comment: (NOTE) Nonreactive HCV antibody screen is consistent with no HCV infections,  unless recent infection is suspected or other evidence exists to indicate HCV infection. Performed at [facility], [place] [address] [place],  [place] [address]   Glucose, capillary     Status: Abnormal   Collection Time: 03/09/20  8:33 AM  Result Value Ref Range   Glucose-Capillary 140 (H) 70 - 99 mg/dL    Comment: Glucose reference range applies only to samples taken after fasting for at least 8 hours.  Glucose, capillary     Status: Abnormal   Collection Time: 03/09/20 11:50 AM  Result Value Ref Range   Glucose-Capillary 118 (H) 70 - 99 mg/dL    Comment: Glucose reference range applies only to samples taken after fasting for at least 8 hours.  Glucose, capillary     Status: Abnormal   Collection Time: 03/09/20  4:08 PM  Result Value Ref Range   Glucose-Capillary 167 (H) 70 - 99 mg/dL    Comment: Glucose reference range applies only to samples taken after fasting for at least 8 hours.  APTT     Status: Abnormal   Collection Time: 03/09/20  4:59 PM  Result Value Ref Range   aPTT 50 (H) 24 - 36 seconds    Comment:        IF BASELINE aPTT IS ELEVATED, SUGGEST PATIENT RISK ASSESSMENT BE USED TO DETERMINE APPROPRIATE ANTICOAGULANT THERAPY. Performed at [facility], [place] [address], [place], [place] [address]   Glucose, capillary     Status: Abnormal   Collection Time: 03/09/20  8:09 PM  Result Value Ref Range   Glucose-Capillary 160 (H) 70 - 99 mg/dL    Comment: Glucose reference range applies only to samples taken after fasting for at least 8 hours.   Comment 1 Notify RN    Comment 2 Document in Chart   Glucose, capillary     Status: Abnormal   Collection Time: 03/10/20 12:34 AM  Result Value Ref Range   Glucose-Capillary 118 (H) 70 - 99 mg/dL    Comment: Glucose reference range applies only to samples taken after fasting for at least 8 hours.   Comment 1 Notify RN    Comment 2 Document in Chart   Glucose, capillary     Status: Abnormal   Collection Time: 03/10/20  4:53 AM  Result Value Ref Range   Glucose-Capillary 179 (H) 70 - 99 mg/dL    Comment: Glucose reference range applies only to samples taken after  fasting for at least 8 hours.   Comment 1 Notify RN    Comment 2 Document in Chart   CBC     Status: Abnormal   Collection Time: 03/10/20  5:00 AM  Result Value Ref Range   WBC 12.2 (H) 4.0 - 10.5 K/uL   RBC 2.37 (L) 3.87 - 5.11 MIL/uL   Hemoglobin 7.4 (L) 12.0 - 15.0 g/dL   HCT 24.0 (L) 36.0 - 46.0 %   MCV 101.3 (H) 80.0 - 100.0 fL   MCH 31.2 26.0 - 34.0 pg   MCHC 30.8 30.0 - 36.0 g/dL   RDW 20.6 (H) 11.5 - 15.5 %   Platelets  268 150 - 400 K/uL    Comment: REPEATED TO VERIFY   nRBC 0.0 0.0 - 0.2 %    Comment: Performed at [facility], [place] [address], [place], [place] [address]  APTT     Status: Abnormal   Collection Time: 03/10/20  5:00 AM  Result Value Ref Range   aPTT 52 (H) 24 - 36 seconds    Comment:        IF BASELINE aPTT IS ELEVATED, SUGGEST PATIENT RISK ASSESSMENT BE USED TO DETERMINE APPROPRIATE ANTICOAGULANT THERAPY. Performed at [facility], [place] [address], [place], [place] [id number]   Basic metabolic panel     Status: Abnormal   Collection Time: 03/10/20  5:00 AM  Result Value Ref Range   Sodium 139 135 - 145 mmol/L   Potassium 4.2 3.5 - 5.1 mmol/L   Chloride 107 98 - 111 mmol/L   CO2 24 22 - 32 mmol/L   Glucose, Bld 200 (H) 70 - 99 mg/dL    Comment: Glucose reference range applies only to samples taken after fasting for at least 8 hours.   BUN 31 (H) 8 - 23 mg/dL   Creatinine, Ser 0.83 0.44 - 1.00 mg/dL   Calcium 8.7 (L) 8.9 - 10.3 mg/dL   GFR calc non Af Amer >60 >60 mL/min   GFR calc Af Amer >60 >60 mL/min   Anion gap 8 5 - 15    Comment: Performed at [facility] [address] [place], [place] [address]  Lactate dehydrogenase     Status: Abnormal   Collection Time: 03/10/20  5:00 AM  Result Value Ref Range   LDH 410 (H) 98 - 192 U/L    Comment: Performed at [facility], [place] [address] [place], [place] [address]  Reticulocytes     Status: Abnormal   Collection Time: 03/10/20  5:00 AM  Result Value Ref Range   Retic Ct  Pct 5.5 (H) 0.4 - 3.1 %   RBC. 2.47 (L) 3.87 - 5.11 MIL/uL   Retic Count, Absolute 135.9 19.0 - 186.0 K/uL   Immature Retic Fract 21.0 (H) 2.3 - 15.9 %    Comment: Performed at [facility] [address] [place], [place] [address]  Hepatic function panel     Status: Abnormal   Collection Time: 03/10/20  5:00 AM  Result Value Ref Range   Total Protein 4.8 (L) 6.5 - 8.1 g/dL   Albumin 2.5 (L) 3.5 - 5.0 g/dL   AST 36 15 - 41 U/L   ALT 54 (H) 0 - 44 U/L   Alkaline Phosphatase 124 38 - 126 U/L   Total Bilirubin 0.6 0.3 - 1.2 mg/dL   Bilirubin, Direct 0.1 0.0 - 0.2 mg/dL   Indirect Bilirubin 0.5 0.3 - 0.9 mg/dL    Comment: Performed at [facility], [place] [address], [place], [place] [address]  Direct antiglobulin test     Status: None   Collection Time: 03/10/20  6:00 AM  Result Value Ref Range   DAT, complement NEG    DAT, IgG      NEG Performed at [facility] [address], [place], [place] [address]   Glucose, capillary     Status: Abnormal   Collection Time: 03/10/20  7:31 AM  Result Value Ref Range   Glucose-Capillary 157 (H) 70 - 99 mg/dL    Comment: Glucose reference range applies only to samples taken after  fasting for at least 8 hours.  Glucose, capillary     Status: Abnormal   Collection Time: 03/10/20 11:37 AM  Result Value Ref Range   Glucose-Capillary 109 (H) 70 - 99 mg/dL    Comment: Glucose reference range applies only to samples taken after fasting for at least 8 hours.  Glucose, capillary     Status: Abnormal   Collection Time: 03/10/20  3:39 PM  Result Value Ref Range   Glucose-Capillary 172 (H) 70 - 99 mg/dL    Comment: Glucose reference range applies only to samples taken after fasting for at least 8 hours.  APTT     Status: Abnormal   Collection Time: 03/10/20  3:53 PM  Result Value Ref Range   aPTT 52 (H) 24 - 36 seconds    Comment:        IF BASELINE aPTT IS ELEVATED, SUGGEST PATIENT RISK ASSESSMENT BE USED TO DETERMINE  APPROPRIATE ANTICOAGULANT THERAPY. Performed at [facility], [place] [address], [place], [place] [address]    DG Swallowing Func-Speech Pathology  Result Date: 03/09/2020 Objective Swallowing Evaluation: Type of Study: MBS-Modified Barium Swallow Study  Patient Details Name: RAGENE INGRIM MRN: [id number] Date of Birth: Sep 22, 1946 Today's Date: 03/09/2020 Time: SLP Start Time (ACUTE ONLY): 1102 -SLP Stop Time (ACUTE ONLY): 1116 SLP Time Calculation (min) (ACUTE ONLY): 14 min Past Medical History: Past Medical History: Diagnosis Date . Hepatitis C  . Hyperlipidemia  . Hypertension  . Osteoporosis  Past Surgical History: Past Surgical History: Procedure Laterality Date . IR ANGIO INTRA EXTRACRAN SEL INTERNAL CAROTID BILAT MOD SED  02/24/2020 . IR ANGIO VERTEBRAL SEL VERTEBRAL UNI R MOD SED  02/24/2020 . IR ANGIOGRAM SELECTIVE EACH ADDITIONAL VESSEL  02/24/2020 . IR IVC FILTER PLMT / S&I /IMG GUID/MOD SED  02/22/2020 . IR THROMBECT VENO MECH MOD SED  02/24/2020 . IR US GUIDE VASC ACCESS RIGHT  02/24/2020 . IR US GUIDE VASC ACCESS RIGHT  02/24/2020 . IR VENO SAGITTAL SINUS  02/24/2020 . IR VENO/JUGULAR RIGHT  02/24/2020 . RADIOLOGY WITH ANESTHESIA N/A 02/24/2020  Procedure: IR WITH ANESTHESIA;  Surgeon: Radiologist, Medication, MD;  Location: [facility];  Service: Radiology;  Laterality: N/A; HPI: 74 y.o. female with history of HTN presenting with severe persistent HA accompanied by nausea and vomiting. Dx right lateral IVH. Developed acute PE; IVC filter placed 4/13. 4/15 head CT demonstrated deep cerebral thrombosis s/p IR for thrombectomy with expressive aphasia after. 4/16 MRI showed multiple cerebral and cerebellar infarcts, Rt temporal infarct with cytotoxic edema and hypertonic saline started.  Subjective: alert, restless Assessment / Plan / Recommendation CHL IP CLINICAL IMPRESSIONS 03/09/2020 Clinical Impression Pt's oropharyngeal dysphagia appears to be largely cognitive in nature, although testing was also very  limited as she began to refuse all POs after only a few trials. She has mildly slow posterior propulsion with purees, leaving minimal lingual residue on her tongue. Pharyngeally she has no overt weakness, but she did aspirate thin liquids in the setting of premature spillage from her oral cavity before the swallow. This elicited a strong cough response, but visibility below her vocal cords was limited and therefore unable to tell if aspirates were cleared. She had a delayed swallow with nectar thick liquids, suspect in part due to small bolus size but more so because pt was so heavily distracted in that moment. Recommend starting a trial of Dys 1 (puree) diet and nectar thick liquids, with full supervision being important given her mentation and the limited results  from this study. Given her repeated pattern of refusing POs, I think it may be challenging to meet her nutritional needs strictly PO until we see more cognitive-linguistic improvements. Will continue to follow. SLP Visit Diagnosis Dysphagia, oropharyngeal phase (R13.12) Attention and concentration deficit following -- Frontal lobe and executive function deficit following -- Impact on safety and function Moderate aspiration risk;Risk for inadequate nutrition/hydration   CHL IP TREATMENT RECOMMENDATION 03/09/2020 Treatment Recommendations Therapy as outlined in treatment plan below   Prognosis 03/09/2020 Prognosis for Safe Diet Advancement Fair Barriers to Reach Goals Cognitive deficits;Severity of deficits Barriers/Prognosis Comment -- CHL IP DIET RECOMMENDATION 03/09/2020 SLP Diet Recommendations Dysphagia 1 (Puree) solids;Nectar thick liquid Liquid Administration via Cup;Straw Medication Administration Crushed with puree Compensations Slow rate;Small sips/bites Postural Changes Seated upright at 90 degrees   CHL IP OTHER RECOMMENDATIONS 03/09/2020 Recommended Consults -- Oral Care Recommendations Oral care BID Other Recommendations Order thickener from  pharmacy;Prohibited food (jello, ice cream, thin soups);Remove water pitcher;Have oral suction available   CHL IP FOLLOW UP RECOMMENDATIONS 03/09/2020 Follow up Recommendations Inpatient Rehab   CHL IP FREQUENCY AND DURATION 03/09/2020 Speech Therapy Frequency (ACUTE ONLY) min 2x/week Treatment Duration 2 weeks      CHL IP ORAL PHASE 03/09/2020 Oral Phase Impaired Oral - Pudding Teaspoon -- Oral - Pudding Cup -- Oral - Honey Teaspoon -- Oral - Honey Cup -- Oral - Nectar Teaspoon -- Oral - Nectar Cup -- Oral - Nectar Straw Premature spillage Oral - Thin Teaspoon -- Oral - Thin Cup -- Oral - Thin Straw Premature spillage Oral - Puree Premature spillage;Delayed oral transit Oral - Mech Soft -- Oral - Regular -- Oral - Multi-Consistency -- Oral - Pill -- Oral Phase - Comment --  CHL IP PHARYNGEAL PHASE 03/09/2020 Pharyngeal Phase Impaired Pharyngeal- Pudding Teaspoon -- Pharyngeal -- Pharyngeal- Pudding Cup -- Pharyngeal -- Pharyngeal- Honey Teaspoon -- Pharyngeal -- Pharyngeal- Honey Cup -- Pharyngeal -- Pharyngeal- Nectar Teaspoon -- Pharyngeal -- Pharyngeal- Nectar Cup -- Pharyngeal -- Pharyngeal- Nectar Straw Delayed swallow initiation-pyriform sinuses Pharyngeal -- Pharyngeal- Thin Teaspoon -- Pharyngeal -- Pharyngeal- Thin Cup -- Pharyngeal -- Pharyngeal- Thin Straw Penetration/Aspiration before swallow Pharyngeal Material enters airway, passes BELOW cords and not ejected out despite cough attempt by patient;Material enters airway, passes BELOW cords then ejected out Pharyngeal- Puree WFL Pharyngeal -- Pharyngeal- Mechanical Soft -- Pharyngeal -- Pharyngeal- Regular -- Pharyngeal -- Pharyngeal- Multi-consistency -- Pharyngeal -- Pharyngeal- Pill -- Pharyngeal -- Pharyngeal Comment --  CHL IP CERVICAL ESOPHAGEAL PHASE 03/09/2020 Cervical Esophageal Phase WFL Pudding Teaspoon -- Pudding Cup -- Honey Teaspoon -- Honey Cup -- Nectar Teaspoon -- Nectar Cup -- Nectar Straw -- Thin Teaspoon -- Thin Cup -- Thin Straw -- Puree  -- Mechanical Soft -- Regular -- Multi-consistency -- Pill -- Cervical Esophageal Comment -- Osie Bond., M.A. [facility] Pager [phone] Office [phone] 03/09/2020, 12:57 PM                  Medical Problem List and Plan: 1.  Deficits with mobility, self-care, cognition secondary to R>L brain bleeds.  -patient may shower  -ELOS/Goals: 18-22 days/Supervision/Min A  Admit to CIR 2.  PE/Antithrombotics: -anticoagulation:  Pharmaceutical: Other (comment)On Argatroban as pradaxa cannot be crushed.    -antiplatelet therapy: N/A 3. Heaches/Pain Management: On depakote tid for agitation as well as HA.    Monitor with increase stimulation 4. Mood: LCSW to follow for evaluation and support.   -antipsychotic agents: N/A 5. Neuropsych: This patient is not capable of  making decisions on her own behalf. 6. Skin/Wound Care: Routine pressure relief measures.  7. Fluids/Electrolytes/Nutrition: Monitor I/O.--now on dysphagia 1, nectar liquids. Intake poor--will keep tube feeds for now.   Advance diet as tolerated 8. 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 Decadron tapered to 4 mg bid on 04/27--slow taper to off.   9. Polycythemia Vera: JAK2 mutation positive. S/p Leukapheresis X 3. Hydrea prn to keep HCT<45     10. 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 Thrombocytopenia resolving.     CBC ordered 11. PAF/A fib with RVR:  Felt to be due to illness . Current recommendations are for [facility] for 6-9 months.   Monitor with increased activity 12. Prediabetes: Hgb A1c-6.1. Monitor BS every 4 hours with 5 units for coverage as well as SSI for elevated BS  Monitor with increased mobility 13. Delirium:  With sundowning. On amantadine for activation and Depakote for agitation. Low dose Seroquel resumed 4/29 pm.   14. Anemia: Continue to monitor with daily labs.  H/H down to 7.4 today.   CBC ordered  Bary Leriche, PA-C 03/10/2020  I have personally performed a face to face diagnostic evaluation, including, but not limited to relevant history and physical exam findings, of this patient and developed relevant assessment and plan.  Additionally, I have reviewed and concur with the physician assistant's documentation above.  Delice Lesch, MD, ABPMR  The patient's status has not changed. The original post admission physician evaluation remains appropriate, and any changes from the pre-admission screening or documentation from the acute chart are noted above.   Delice Lesch, MD, ABPMR

## 2020-03-10 NOTE — Progress Notes (Signed)
Nutrition Follow-up  DOCUMENTATION CODES:   Not applicable  INTERVENTION:  Provide Magic cup TID with meals, each supplement provides 290 kcal and 9 grams of protein.  Continue Glucerna 1.2 formula via Cortrak NGT at goal rate of 60 ml/hr.   Continue 30 ml Prostat once daily per tube.   Tube feeding regimen provides 1828 kcal, 101 grams of protein, and 1167 ml water.   NUTRITION DIAGNOSIS:   Inadequate oral intake related to inability to eat as evidenced by NPO status; diet advanced; progressing  GOAL:   Patient will meet greater than or equal to 90% of their needs; met with TF  MONITOR:   PO intake, Diet advancement, Skin, TF tolerance, Weight trends, Labs, I & O's  REASON FOR ASSESSMENT:   Consult Enteral/tube feeding initiation and management  ASSESSMENT:   Patient with PMH significant for HTN, HLD, and Hepatitis C. Presents this admission with right lateral ventricle IVH secondary to HTN. Pt with stroke: multiple punctate B anterior and posterior circulation embolic infarcts in setting of PAF and polycythemia vera.  4/4 admitted for IVH secondary to HTN dx with polycythemia vera  4/13 respiratory distress secondary to acute bilateral emboli s/p IVC filter placement 4/15 s/p mechanical thrombectomy  4/19 cortrak placed  Diet advanced to a dysphagia 1 diet with nectar thick liquids. Per SLP, pt only took a few bites of purees and a few sips of nectar thick liquids at lunch today. SLP recommends continuation of PO trials. Plans to continue tube feeding orders per Cotrak NGT which provides 100% of nutrition needs as po intake poor. Magic cup with meals to aid in PO intake.   Labs and medications reviewed.   Diet Order:   Diet Order            DIET - DYS 1 Room service appropriate? Yes; Fluid consistency: Nectar Thick  Diet effective now              EDUCATION NEEDS:   Not appropriate for education at this time  Skin:  Skin Assessment: Reviewed RN  Assessment Skin Integrity Issues:: Incisions Incisions: N/A  Last BM:  4/29  Height:   Ht Readings from Last 1 Encounters:  02/14/20 '5\' 2"'  (1.575 m)    Weight:   Wt Readings from Last 1 Encounters:  03/10/20 75.6 kg    BMI:  Body mass index is 30.48 kg/m.  Estimated Nutritional Needs:   Kcal:  1700-1900 kcal  Protein:  85-100 grams  Fluid:  >/= 1.7 L/day   Corrin Parker, MS, RD, LDN RD pager number/after hours weekend pager number on Amion.

## 2020-03-10 NOTE — Progress Notes (Signed)
Patient transferred to [phone]. Family made aware.

## 2020-03-10 NOTE — Progress Notes (Signed)
[facility] for Argatroban Indication: HIT  Allergies  Allergen Reactions  . Erythromycin Itching and Other (See Comments)    Severe stomach pains, diarrhea  . Heparin     Likely HIT, SRA negative    Patient Measurements: Height: 5\' 2"  (157.5 cm) Weight: 75.6 kg (166 lb 10.7 oz) IBW/kg (Calculated) : 50.1  Vital Signs: Temp: 98.8 F (37.1 C) (04/30 1542) Temp Source: Oral (04/30 1542) BP: 127/64 (04/30 1542) Pulse Rate: 95 (04/30 1542)  Labs: Recent Labs    03/08/20 0500 03/08/20 1115 03/09/20 0500 03/09/20 0500 03/09/20 1659 03/10/20 0500 03/10/20 1553  HGB 8.8*  --  7.7*  --   --  7.4*  --   HCT 29.0*  --  24.8*  --   --  24.0*  --   PLT 188  --  223  --   --  268  --   APTT 44*   < > 55*   < > 50* 52* 52*  CREATININE 0.90  --  0.83  --   --  0.83  --    < > = values in this interval not displayed.    Estimated Creatinine Clearance: 57.5 mL/min (by C-G formula based on SCr of 0.83 mg/dL).  Assessment: 74 year old initially admitted 4/4 with acute IVH and thrombocythemia with PLTC of 990k. Patient was ultimately diagnosed with polythemia, now s/p leukophoresis, therapeutic phlebotomy, and initiation of hydroxyurea. On 4/13 - found to have B/L PE - decision was risk of AC was too great and IVC placed. CT scan 4/15 showed extensive thrombus within confluence of sinuses and sent to IR with mechanical thrombectomy x5.  SRA resulted negative, Dr. Jana Hakim ordered repeat sent to different lab - still with working diagnosis of HIT d/t high pre-test probability. Also need to look into other causes for new TCP. Platelets are now WNL.  APTT remains therapeutic at 52 sec on agatroban 0.50mcg/kg/min (goal 50-60 seconds). Currently using a dosing weight of 79.7 kg.  No reported bleeding with drop in hemoglobin - down to 7.4 today.   Goal of Therapy:  aPTT 50 - 60 seconds Monitor platelets by anticoagulation protocol: Yes    Plan: Continue argatroban at 0.65 mcg/kg/min  Continue q12h aPTT checks Monitor daily CBC, s/sx bleeding   Arturo Morton, PharmD, BCPS Please check AMION for all [facility] contact numbers Clinical Pharmacist 03/10/2020 5:04 PM

## 2020-03-10 NOTE — Progress Notes (Signed)
[facility] for Argatroban Indication: HIT  Allergies  Allergen Reactions  . Erythromycin Itching and Other (See Comments)    Severe stomach pains, diarrhea  . Heparin     Likely HIT, SRA negative    Patient Measurements: Height: 5\' 2"  (157.5 cm) Weight: 75.6 kg (166 lb 10.7 oz) IBW/kg (Calculated) : 50.1  Vital Signs: Temp: 98.3 F (36.8 C) (04/30 0730) Temp Source: Oral (04/30 0730) BP: 104/75 (04/30 0730) Pulse Rate: 91 (04/30 0730)  Labs: Recent Labs    03/08/20 0500 03/08/20 1115 03/09/20 0500 03/09/20 1659 03/10/20 0500  HGB 8.8*  --  7.7*  --  7.4*  HCT 29.0*  --  24.8*  --  24.0*  PLT 188  --  223  --  268  APTT 44*   < > 55* 50* 52*  CREATININE 0.90  --  0.83  --  0.83   < > = values in this interval not displayed.    Estimated Creatinine Clearance: 57.5 mL/min (by C-G formula based on SCr of 0.83 mg/dL).  Assessment: 74 year old initially admitted 4/4 with acute IVH and thrombocythemia with PLTC of 990k. Patient was ultimately diagnosed with polythemia, now s/p leukophoresis, therapeutic phlebotomy, and initiation of hydroxyurea. On 4/13 - found to have B/L PE - decision was risk of AC was too great and IVC placed. CT scan 4/15 showed extensive thrombus within confluence of sinuses and sent to IR with mechanical thrombectomy x5.  SRA resulted negative, Dr. Jana Hakim ordered repeat sent to different lab - still with working diagnosis of HIT d/t high pre-test probability. Also need to look into other causes for new TCP. Platelets are now WNL.   aPTT 52 sec on agatroban 0.82mcg/kg/min (goal 50-60 seconds). Currently using a dosing weight of 79.7 kg.  No reported bleeding even with drop in hemoglobin.   Goal of Therapy:  aPTT 50 - 60 seconds Monitor platelets by anticoagulation protocol: Yes   Plan: Continue argatroban at 0.65 mcg/kg/min  Continue q12h aPTT checks Monitor daily CBC, s/sx bleeding  [name],  PharmD, BCPS Clinical Pharmacist Please see AMION for all pharmacy numbers 03/10/2020 10:06 AM

## 2020-03-10 NOTE — Progress Notes (Signed)
Physical Therapy Treatment Patient Details Name: [name] MRN: [id number] DOB: 1946-02-21 Today's Date: 03/10/2020    History of Present Illness 74 yo female presented with persistent HA, nausea and vomiting pt with stable R lateral ventricle IVH. Pt with acute bil PE 4/13, IVC filter placed 4/14. 4/15 head CT demonstrated deep cerebral thrombosis s/p IR for thrombectomy with expressive aphasia after. 4/16 MRI showed multiple cerebral and cerebellar infarcts, Rt temporal infarct with cytotoxic edema and hypertonic saline started.  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 PMHx: HTN, HLD, hep C, hypothyroidism    PT Comments    Pt seen for second treatment with family assisting for short distance gait, multiple bouts with seated rest break. Mod assist overall for trunk support over buckling legs.  Assisted in hand over hand self feeding at end of session.  Family will need assist and education to feel comfortable with assisting her to eat.     Follow Up Recommendations  CIR     Equipment Recommendations  Rolling walker with 5" wheels;3in1 (PT);Wheelchair (measurements PT);Wheelchair cushion (measurements PT)    Recommendations for Other Services Rehab consult     Precautions / Restrictions Precautions Precautions: Fall Precaution Comments: expressive/receptive difficulties Leans R    Mobility  Bed Mobility Overal bed mobility: Needs Assistance Bed Mobility: Supine to Sit     Supine to sit: Min assist;HOB elevated     General bed mobility comments: Min hand held assist to pull up to sitting EOB.  Pt using bed rail and HOB maximally elevated.  Pt initating better than last session.   Transfers Overall transfer level: Needs assistance Equipment used: Rolling walker (2 wheeled) Transfers: Sit to/from Stand Sit to Stand: Mod assist         General transfer comment: Up to mod assist to stand from bed and recliner chair x 3  Ambulation/Gait Ambulation/Gait  assistance: Mod assist Gait Distance (Feet): 10 Feet(x3) Assistive device: Rolling walker (2 wheeled) Gait Pattern/deviations: Step-through pattern;Drifts right/left;Staggering right Gait velocity: decreased Gait velocity interpretation: <1.8 ft/sec, indicate of risk for recurrent falls General Gait Details: Pt walked further overall today, but with more seated rest breaks and shorter boughts.  Her LEs were weak and buckling.  She continues to lean right and is more stable with assist from R side.  Cues to step up into RW for increased stability.  Son pulling IV pole and husband behind with chair to follow.        Modified Rankin (Stroke Patients Only) Modified Rankin (Stroke Patients Only) Pre-Morbid Rankin Score: No symptoms Modified Rankin: Moderately severe disability     Balance Overall balance assessment: Needs assistance Sitting-balance support: Feet supported;Bilateral upper extremity supported Sitting balance-Leahy Scale: Fair Sitting balance - Comments: close supervision EOB.  Postural control: Right lateral lean Standing balance support: Bilateral upper extremity supported Standing balance-Leahy Scale: Poor Standing balance comment: needs external assist from RW and therapist.                             Cognition Arousal/Alertness: Awake/alert Behavior During Therapy: Restless;Impulsive Overall Cognitive Status: Impaired/Different from baseline Area of Impairment: Orientation;Attention;Memory;Following commands;Safety/judgement;Awareness;Problem solving                 Orientation Level: Disoriented to;Time;Situation;Place;Person Current Attention Level: Sustained(short periods of sustained attention with structured environ) Memory: Decreased recall of precautions;Decreased short-term memory Following Commands: Follows one step commands with increased time;Follows one step commands inconsistently  Safety/Judgement: Decreased awareness of  safety;Decreased awareness of deficits Awareness: Intellectual Problem Solving: Decreased initiation;Difficulty sequencing;Requires verbal cues;Requires tactile cues General Comments: Pt remains confused, unaware of her current situation, safety, picking at cortrack with mittens off.           General Comments General comments (skin integrity, edema, etc.): Pt situated at end of session in recliner chair to eat, hand over hand assist needed at times to initiate self feeding.  Some coughing, but strong, paused feeding to let her cough and stop coughing before continuing.  She did not yet have thickener in her room.       Pertinent Vitals/Pain Pain Assessment: Faces Faces Pain Scale: No hurt           PT Goals (current goals can now be found in the care plan section) Acute Rehab PT Goals Patient Stated Goal: to go home, drink some water Progress towards PT goals: Progressing toward goals    Frequency    Min 4X/week      PT Plan Current plan remains appropriate       AM-PAC PT "6 Clicks" Mobility   Outcome Measure  Help needed turning from your back to your side while in a flat bed without using bedrails?: A Little Help needed moving from lying on your back to sitting on the side of a flat bed without using bedrails?: A Little Help needed moving to and from a bed to a chair (including a wheelchair)?: A Lot Help needed standing up from a chair using your arms (e.g., wheelchair or bedside chair)?: A Lot Help needed to walk in hospital room?: A Lot Help needed climbing 3-5 steps with a railing? : Total 6 Click Score: 13    End of Session Equipment Utilized During Treatment: Gait belt Activity Tolerance: Patient limited by fatigue Patient left: in chair;with call bell/phone within reach;with family/visitor present Nurse Communication: Mobility status PT Visit Diagnosis: Other abnormalities of gait and mobility (R26.89);Other symptoms and signs involving the nervous system  [id number])     Time: [id number] PT Time Calculation (min) (ACUTE ONLY): 52 min  Charges:  $Gait Training: 23-37 mins $Therapeutic Activity: 8-22 mins            Verdene Lennert, PT, DPT  Acute Rehabilitation [phone] pager #[phone] office              03/10/2020, 6:03 PM

## 2020-03-10 NOTE — Progress Notes (Signed)
[name]   DOB:May 06, 1946   DU#:[id number]   CBJ#:[id number]  Subjective:  [name] is sleeping quietly the time my visit.  Still with hand restraints on.  Spoke with nursing and no bleeding has been noted.  Her level of alertness is improving overall.  Speech therapy evaluation completed this morning.  Recommend dysphagia 1 with nectar thick liquids.  Objective: white woman examined in bed, restraints in place, ng in place Vitals:   03/10/20 0730 03/10/20 1212  BP: 104/75 124/67  Pulse: 91 92  Resp: 16 17  Temp: 98.3 F (36.8 C) 98 F (36.7 C)  SpO2: 99% 96%    Body mass index is 30.48 kg/m.  Intake/Output Summary (Last 24 hours) at 03/10/2020 1415 Last data filed at 03/10/2020 0845 Gross per 24 hour  Intake 1255.93 ml  Output --  Net 1255.93 ml   Lungs: no rhonchi or wheezes, auscultated anterolaterally Hart: RRR, no murmur appreciated Abd soft, +BS Neuro: alert but disoriented x3, not following commands, good social responses (smiles pleasantly, gives vague answers)   Ref Range & Units 7 d ago 2 wk ago  Heparin Induced Plt Ab 0.000 - 0.400 OD 1.469High   2.553High  CM        CBG (last 3)  Recent Labs    03/10/20 0453 03/10/20 0731 03/10/20 1137  GLUCAP 179* 157* 109*     Labs:  Lab Results  Component Value Date   WBC 12.2 (H) 03/10/2020   HGB 7.4 (L) 03/10/2020   HCT 24.0 (L) 03/10/2020   MCV 101.3 (H) 03/10/2020   PLT 268 03/10/2020   NEUTROABS 11.4 (H) 03/09/2020    '@LASTCHEMISTRY' @  Urine Studies No results for input(s): UHGB, CRYS in the last 72 hours.  Invalid input(s): UACOL, UAPR, USPG, UPH, UTP, UGL, UKET, UBIL, UNIT, UROB, ULEU, UEPI, UWBC, URBC, UBAC, CAST, [place], [place]  Basic Metabolic Panel: Recent Labs  Lab 03/06/20 1000 03/06/20 1000 03/07/20 0608 03/07/20 0608 03/08/20 0500 03/08/20 0500 03/09/20 0500 03/10/20 0500  NA 147*  --  146*  --  142  --  140 139  K 3.8   < > 4.1   < > 3.9   < > 4.0 4.2  CL 117*  --  114*  --   108  --  107 107  CO2 23  --  25  --  23  --  24 24  GLUCOSE 207*  --  107*  --  77  --  153* 200*  BUN 31*  --  31*  --  32*  --  30* 31*  CREATININE 0.67  --  0.85  --  0.90  --  0.83 0.83  CALCIUM 9.1  --  9.4  --  9.4  --  8.9 8.7*   < > = values in this interval not displayed.   GFR Estimated Creatinine Clearance: 57.5 mL/min (by C-G formula based on SCr of 0.83 mg/dL). Liver Function Tests: Recent Labs  Lab 03/10/20 0500  AST 36  ALT 54*  ALKPHOS 124  BILITOT 0.6  PROT 4.8*  ALBUMIN 2.5*   No results for input(s): LIPASE, AMYLASE in the last 168 hours. No results for input(s): AMMONIA in the last 168 hours. Coagulation profile No results for input(s): INR, PROTIME in the last 168 hours.  CBC: Recent Labs  Lab 03/05/20 0357 03/05/20 0357 03/06/20 0538 03/07/20 0608 03/08/20 0500 03/09/20 0500 03/10/20 0500  WBC 14.9*   < > 12.2* 13.3* 14.0* 13.1* 12.2*  NEUTROABS 12.9*  --  10.4* 10.5* 11.3* 11.4*  --   HGB 8.3*   < > 8.0* 8.6* 8.8* 7.7* 7.4*  HCT 27.9*   < > 26.7* 28.5* 29.0* 24.8* 24.0*  MCV 101.5*   < > 101.5* 101.8* 102.5* 99.2 101.3*  PLT 85*   < > 108* 149* 188 223 268   < > = values in this interval not displayed.   Cardiac Enzymes: No results for input(s): CKTOTAL, CKMB, CKMBINDEX, TROPONINI in the last 168 hours. BNP: Invalid input(s): POCBNP CBG: Recent Labs  Lab 03/09/20 2009 03/10/20 0034 03/10/20 0453 03/10/20 0731 03/10/20 1137  GLUCAP 160* 118* 179* 157* 109*   D-Dimer No results for input(s): DDIMER in the last 72 hours. Hgb A1c No results for input(s): HGBA1C in the last 72 hours. Lipid Profile Recent Labs    03/09/20 0500  TRIG 85   Thyroid function studies No results for input(s): TSH, T4TOTAL, T3FREE, THYROIDAB in the last 72 hours.  Invalid input(s): FREET3 Anemia work up Recent Labs    03/08/20 1854 03/09/20 0500 03/10/20 0500  VITAMINB12 298  --   --   FOLATE  --  8.7  --   FERRITIN  --  228  --   TIBC  --   231*  --   IRON  --  53  --   RETICCTPCT  --  4.6* 5.5*   Microbiology No results found for this or any previous visit (from the past 240 hour(s)).    Studies:  DG Swallowing Func-Speech Pathology  Result Date: 03/09/2020 Objective Swallowing Evaluation: Type of Study: MBS-Modified Barium Swallow Study  Patient Details Name: [name] MRN: [id number] Date of Birth: Sep 16, 1946 Today's Date: 03/09/2020 Time: SLP Start Time (ACUTE ONLY): 1102 -SLP Stop Time (ACUTE ONLY): 1116 SLP Time Calculation (min) (ACUTE ONLY): 14 min Past Medical History: Past Medical History: Diagnosis Date . Hepatitis C  . Hyperlipidemia  . Hypertension  . Osteoporosis  Past Surgical History: Past Surgical History: Procedure Laterality Date . IR ANGIO INTRA EXTRACRAN SEL INTERNAL CAROTID BILAT MOD SED  02/24/2020 . IR ANGIO VERTEBRAL SEL VERTEBRAL UNI R MOD SED  02/24/2020 . IR ANGIOGRAM SELECTIVE EACH ADDITIONAL VESSEL  02/24/2020 . IR IVC FILTER PLMT / S&I /IMG GUID/MOD SED  02/22/2020 . IR THROMBECT VENO MECH MOD SED  02/24/2020 . IR US GUIDE VASC ACCESS RIGHT  02/24/2020 . IR US GUIDE VASC ACCESS RIGHT  02/24/2020 . IR VENO SAGITTAL SINUS  02/24/2020 . IR VENO/JUGULAR RIGHT  02/24/2020 . RADIOLOGY WITH ANESTHESIA N/A 02/24/2020  Procedure: IR WITH ANESTHESIA;  Surgeon: Radiologist, Medication, MD;  Location: [place];  Service: Radiology;  Laterality: N/A; HPI: 74 y.o. female with history of HTN presenting with severe persistent HA accompanied by nausea and vomiting. Dx right lateral IVH. Developed acute PE; IVC filter placed 4/13. 4/15 head CT demonstrated deep cerebral thrombosis s/p IR for thrombectomy with expressive aphasia after. 4/16 MRI showed multiple cerebral and cerebellar infarcts, Rt temporal infarct with cytotoxic edema and hypertonic saline started.  Subjective: alert, restless Assessment / Plan / Recommendation CHL IP CLINICAL IMPRESSIONS 03/09/2020 Clinical Impression Pt's oropharyngeal dysphagia appears to be largely  cognitive in nature, although testing was also very limited as she began to refuse all POs after only a few trials. She has mildly slow posterior propulsion with purees, leaving minimal lingual residue on her tongue. Pharyngeally she has no overt weakness, but she did aspirate thin liquids in the setting of premature spillage from  her oral cavity before the swallow. This elicited a strong cough response, but visibility below her vocal cords was limited and therefore unable to tell if aspirates were cleared. She had a delayed swallow with nectar thick liquids, suspect in part due to small bolus size but more so because pt was so heavily distracted in that moment. Recommend starting a trial of Dys 1 (puree) diet and nectar thick liquids, with full supervision being important given her mentation and the limited results from this study. Given her repeated pattern of refusing POs, I think it may be challenging to meet her nutritional needs strictly PO until we see more cognitive-linguistic improvements. Will continue to follow. SLP Visit Diagnosis Dysphagia, oropharyngeal phase (R13.12) Attention and concentration deficit following -- Frontal lobe and executive function deficit following -- Impact on safety and function Moderate aspiration risk;Risk for inadequate nutrition/hydration   CHL IP TREATMENT RECOMMENDATION 03/09/2020 Treatment Recommendations Therapy as outlined in treatment plan below   Prognosis 03/09/2020 Prognosis for Safe Diet Advancement Fair Barriers to Reach Goals Cognitive deficits;Severity of deficits Barriers/Prognosis Comment -- CHL IP DIET RECOMMENDATION 03/09/2020 SLP Diet Recommendations Dysphagia 1 (Puree) solids;Nectar thick liquid Liquid Administration via Cup;Straw Medication Administration Crushed with puree Compensations Slow rate;Small sips/bites Postural Changes Seated upright at 90 degrees   CHL IP OTHER RECOMMENDATIONS 03/09/2020 Recommended Consults -- Oral Care Recommendations Oral care  BID Other Recommendations Order thickener from pharmacy;Prohibited food (jello, ice cream, thin soups);Remove water pitcher;Have oral suction available   CHL IP FOLLOW UP RECOMMENDATIONS 03/09/2020 Follow up Recommendations Inpatient Rehab   CHL IP FREQUENCY AND DURATION 03/09/2020 Speech Therapy Frequency (ACUTE ONLY) min 2x/week Treatment Duration 2 weeks      CHL IP ORAL PHASE 03/09/2020 Oral Phase Impaired Oral - Pudding Teaspoon -- Oral - Pudding Cup -- Oral - Honey Teaspoon -- Oral - Honey Cup -- Oral - Nectar Teaspoon -- Oral - Nectar Cup -- Oral - Nectar Straw Premature spillage Oral - Thin Teaspoon -- Oral - Thin Cup -- Oral - Thin Straw Premature spillage Oral - Puree Premature spillage;Delayed oral transit Oral - Mech Soft -- Oral - Regular -- Oral - Multi-Consistency -- Oral - Pill -- Oral Phase - Comment --  CHL IP PHARYNGEAL PHASE 03/09/2020 Pharyngeal Phase Impaired Pharyngeal- Pudding Teaspoon -- Pharyngeal -- Pharyngeal- Pudding Cup -- Pharyngeal -- Pharyngeal- Honey Teaspoon -- Pharyngeal -- Pharyngeal- Honey Cup -- Pharyngeal -- Pharyngeal- Nectar Teaspoon -- Pharyngeal -- Pharyngeal- Nectar Cup -- Pharyngeal -- Pharyngeal- Nectar Straw Delayed swallow initiation-pyriform sinuses Pharyngeal -- Pharyngeal- Thin Teaspoon -- Pharyngeal -- Pharyngeal- Thin Cup -- Pharyngeal -- Pharyngeal- Thin Straw Penetration/Aspiration before swallow Pharyngeal Material enters airway, passes BELOW cords and not ejected out despite cough attempt by patient;Material enters airway, passes BELOW cords then ejected out Pharyngeal- Puree WFL Pharyngeal -- Pharyngeal- Mechanical Soft -- Pharyngeal -- Pharyngeal- Regular -- Pharyngeal -- Pharyngeal- Multi-consistency -- Pharyngeal -- Pharyngeal- Pill -- Pharyngeal -- Pharyngeal Comment --  CHL IP CERVICAL ESOPHAGEAL PHASE 03/09/2020 Cervical Esophageal Phase WFL Pudding Teaspoon -- Pudding Cup -- Honey Teaspoon -- Honey Cup -- Nectar Teaspoon -- Nectar Cup -- Nectar Straw --  Thin Teaspoon -- Thin Cup -- Thin Straw -- Puree -- Mechanical Soft -- Regular -- Multi-consistency -- Pill -- Cervical Esophageal Comment -- [name]., M.A. [facility] Pager [phone] Office [phone] 03/09/2020, 12:57 PM               Assessment: 74 y.o. [place] woman presenting 02/13/2020 with headache, found to  have biventricular bleeds in the setting of panmyelosis, subsequently with pulmonary emboli and sinus venous thromboses in the setting of heparin induced thrombocytopenia  (1) polycythemia vera  (a) JAK2 mutation positive confirming diagnosis  (b) bone marrow biopsy 02/13/2020 shows no evidence of leukemia, c/w P Vera  (c)  leukapheresis x3 . Last leukapheresis Friday 02/18/2020   (i) HD catheter removed after pheresis on  02/18/2020  (d) hydrea as needed to keep HCT <45 and platelets in normal range   (2) heparin induced thrombocytopenia: positive HIT screen x2  (a) severe thrombocythemia noted 02/24/2020  (b) bilateral pulmonary emboli 02/22/2020   (i) s/p IVC filter placement 02/22/2020   (ii) atrial fibrillation with RVR  (c) venous sinus thromboses 02/24/2020   (i) s/p mechanical thrombectomy  (d) argatroban started 02/25/2020  (e) platelet count responded to heparin withdrawal  (f) confirmatory serotonin release assay negative (see discussion below)   (i) continue argatroban for anticoagulation, no heparin   (ii) repeat HIT screen at [facility] lab again strongly positive  (g) platelets normalized as of 03/08/2020  (3) drug-induced thrombocytopenia  (a) discontinued amiodarone, inessential meds   (i) remains in sinus  (b) resolved-- platelets now WNL  (4) anemia: reticulocytes increasing; no folate, B-12 or iron deficiency; normal renal function; will recheck LDH, t bil & follow  Plan:  Sharen appears stable.  She is alert but disoriented and not following commands.  Platelet count has normalized.  She remains on argatroban.  Once she is taking po's normally will d/c argatroban and start DOACs.  We are requesting pharmacy to find out what DOAC is preferred under the patient's insurance. We will continue to follow her when she transfers to inpatient rehab.  Hemoglobin trending lower. Occult bleeding is a possibility in this anticoagulated patient; no evidence of hemolysis on labs; no nutritional deficiencies or CKD to explain anemia. Retics are up and that is encouraging.  Will follow with you   [name], NP 03/10/2020  2:15 PM Medical Oncology and Hematology [facility] [address] [place], [place] [address] Tel. [phone]    Fax. [phone]

## 2020-03-11 ENCOUNTER — Inpatient Hospital Stay (HOSPITAL_COMMUNITY): Payer: Medicare HMO | Admitting: Speech Pathology

## 2020-03-11 ENCOUNTER — Inpatient Hospital Stay (HOSPITAL_COMMUNITY): Payer: Medicare HMO

## 2020-03-11 ENCOUNTER — Inpatient Hospital Stay (HOSPITAL_COMMUNITY): Payer: Medicare HMO | Admitting: Physical Therapy

## 2020-03-11 DIAGNOSIS — R7303 Prediabetes: Secondary | ICD-10-CM

## 2020-03-11 DIAGNOSIS — D649 Anemia, unspecified: Secondary | ICD-10-CM

## 2020-03-11 DIAGNOSIS — I10 Essential (primary) hypertension: Secondary | ICD-10-CM

## 2020-03-11 DIAGNOSIS — E46 Unspecified protein-calorie malnutrition: Secondary | ICD-10-CM

## 2020-03-11 DIAGNOSIS — D72828 Other elevated white blood cell count: Secondary | ICD-10-CM

## 2020-03-11 DIAGNOSIS — R739 Hyperglycemia, unspecified: Secondary | ICD-10-CM | POA: Insufficient documentation

## 2020-03-11 DIAGNOSIS — E8809 Other disorders of plasma-protein metabolism, not elsewhere classified: Secondary | ICD-10-CM

## 2020-03-11 DIAGNOSIS — D72829 Elevated white blood cell count, unspecified: Secondary | ICD-10-CM

## 2020-03-11 DIAGNOSIS — I69391 Dysphagia following cerebral infarction: Secondary | ICD-10-CM

## 2020-03-11 DIAGNOSIS — I610 Nontraumatic intracerebral hemorrhage in hemisphere, subcortical: Secondary | ICD-10-CM

## 2020-03-11 DIAGNOSIS — T380X5A Adverse effect of glucocorticoids and synthetic analogues, initial encounter: Secondary | ICD-10-CM | POA: Insufficient documentation

## 2020-03-11 LAB — BASIC METABOLIC PANEL WITH GFR
Anion gap: 7 (ref 5–15)
BUN: 28 mg/dL — ABNORMAL HIGH (ref 8–23)
CO2: 24 mmol/L (ref 22–32)
Calcium: 8.7 mg/dL — ABNORMAL LOW (ref 8.9–10.3)
Chloride: 106 mmol/L (ref 98–111)
Creatinine, Ser: 0.87 mg/dL (ref 0.44–1.00)
GFR calc Af Amer: >60 mL/min (ref >60)
GFR calc non Af Amer: >60 mL/min (ref >60)
Glucose, Bld: 139 mg/dL — ABNORMAL HIGH (ref 70–99)
Potassium: 4 mmol/L (ref 3.5–5.1)
Sodium: 137 mmol/L (ref 135–145)

## 2020-03-11 LAB — CBC
HCT: 25.1 % — ABNORMAL LOW (ref 36.0–46.0)
Hemoglobin: 7.8 g/dL — ABNORMAL LOW (ref 12.0–15.0)
MCH: 31.5 pg (ref 26.0–34.0)
MCHC: 31.1 g/dL (ref 30.0–36.0)
MCV: 101.2 fL — ABNORMAL HIGH (ref 80.0–100.0)
Platelets: 328 10*3/uL (ref 150–400)
RBC: 2.48 MIL/uL — ABNORMAL LOW (ref 3.87–5.11)
RDW: 20.8 % — ABNORMAL HIGH (ref 11.5–15.5)
WBC: 13.1 10*3/uL — ABNORMAL HIGH (ref 4.0–10.5)
nRBC: 0 % (ref 0.0–0.2)

## 2020-03-11 LAB — GLUCOSE, CAPILLARY
Glucose-Capillary: 100 mg/dL — ABNORMAL HIGH (ref 70–99)
Glucose-Capillary: 110 mg/dL — ABNORMAL HIGH (ref 70–99)
Glucose-Capillary: 115 mg/dL — ABNORMAL HIGH (ref 70–99)
Glucose-Capillary: 148 mg/dL — ABNORMAL HIGH (ref 70–99)
Glucose-Capillary: 174 mg/dL — ABNORMAL HIGH (ref 70–99)
Glucose-Capillary: 55 mg/dL — ABNORMAL LOW (ref 70–99)
Glucose-Capillary: 85 mg/dL (ref 70–99)
Glucose-Capillary: 94 mg/dL (ref 70–99)

## 2020-03-11 LAB — HEPATIC FUNCTION PANEL
ALT: 47 U/L — ABNORMAL HIGH (ref 0–44)
AST: 22 U/L (ref 15–41)
Albumin: 2.5 g/dL — ABNORMAL LOW (ref 3.5–5.0)
Alkaline Phosphatase: 78 U/L (ref 38–126)
Bilirubin, Direct: <0.1 mg/dL (ref 0.0–0.2)
Total Bilirubin: 0.6 mg/dL (ref 0.3–1.2)
Total Protein: 4.9 g/dL — ABNORMAL LOW (ref 6.5–8.1)

## 2020-03-11 LAB — APTT
aPTT: 51 s — ABNORMAL HIGH (ref 24–36)
aPTT: 55 seconds — ABNORMAL HIGH (ref 24–36)

## 2020-03-11 MED ORDER — FUROSEMIDE 20 MG PO TABS
20.0000 mg | ORAL_TABLET | Freq: Every day | ORAL | Status: DC
  Administered 2020-03-11 – 2020-03-29 (×19): 20 mg via ORAL
  Filled 2020-03-11 (×19): qty 1

## 2020-03-11 MED ORDER — DEXTROSE 50 % IV SOLN
12.5000 g | INTRAVENOUS | Status: AC
  Administered 2020-03-11: 12.5 g via INTRAVENOUS
  Filled 2020-03-11: qty 50

## 2020-03-11 NOTE — Evaluation (Signed)
Occupational Therapy Assessment and Plan  Patient Details  Name: [name] MRN: [id number] Date of Birth: 12-23-1945  OT Diagnosis: abnormal posture, apraxia, cognitive deficits, muscle weakness (generalized) and swelling of limb Rehab Potential: Rehab Potential (ACUTE ONLY): Good ELOS: 2-3 weeks   Today's Date: 03/11/2020 OT Individual Time: 1100-1200 OT Individual Time Calculation (min): 60 min     Problem List:  Patient Active Problem List   Diagnosis Date Noted  . Embolic stroke ([place]) 79/48/0165  . Dysphagia   . Vascular headache   . Thrombocytosis ([place])   . Venous thrombosis   . On amiodarone therapy   . Polycythemia   . Encounter for central line care   . Nontraumatic subcortical hemorrhage of right cerebral hemisphere ([place])   . Abnormal CT of the chest   . Atrial flutter with rapid ventricular response ([place])   . Pulmonary embolus ([place]) 02/22/2020  . Polycythemia vera ([place]) 02/17/2020  . Likely Cerebral amyloid angiopathy ([place]) 02/17/2020  . IVH (intraventricular hemorrhage) ([place]) 02/14/2020  . TIA (transient ischemic attack) 08/17/2014  . Acute sinusitis 08/16/2014  . Headache 08/15/2014  . Hypothyroidism 08/15/2014  . Hepatitis C   . Hyperlipidemia   . Hypertension     Past Medical History:  Past Medical History:  Diagnosis Date  . Hepatitis C   . Hyperlipidemia   . Hypertension   . Osteoporosis    Past Surgical History:  Past Surgical History:  Procedure Laterality Date  . IR ANGIO INTRA EXTRACRAN SEL INTERNAL CAROTID BILAT MOD SED  02/24/2020  . IR ANGIO VERTEBRAL SEL VERTEBRAL UNI R MOD SED  02/24/2020  . IR ANGIOGRAM SELECTIVE EACH ADDITIONAL VESSEL  02/24/2020  . IR IVC FILTER PLMT / S&I /IMG GUID/MOD SED  02/22/2020  . IR THROMBECT VENO MECH MOD SED  02/24/2020  . IR US GUIDE VASC ACCESS RIGHT  02/24/2020  . IR US GUIDE VASC ACCESS RIGHT  02/24/2020  . IR VENO SAGITTAL SINUS  02/24/2020  . IR VENO/JUGULAR RIGHT  02/24/2020  . RADIOLOGY WITH  ANESTHESIA N/A 02/24/2020   Procedure: IR WITH ANESTHESIA;  Surgeon: Radiologist, Medication, MD;  Location: [place];  Service: Radiology;  Laterality: N/A;    Assessment & Plan Clinical Impression:  74 yo female presented with persistent HA, nausea and vomiting pt with stable R lateral ventricle IVH. Pt with acute bil PE 4/13, IVC filter placed 4/14. 4/15 head CT demonstrated deep cerebral thrombosis s/p IR for thrombectomy with expressive aphasia after. 4/16 MRI showed multiple cerebral and cerebellar infarcts, Rt temporal infarct with cytotoxic edema and hypertonic saline started.  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 PMHx: HTN, HLD, hep C, hypothyroidism  Patient currently requires max with basic self-care skills secondary to muscle weakness, decreased cardiorespiratoy endurance, impaired timing and sequencing, unbalanced muscle activation and decreased coordination, decreased motor planning, decreased attention, decreased awareness, decreased problem solving, decreased safety awareness and decreased memory and decreased sitting balance, decreased standing balance, decreased postural control and decreased balance strategies.  Prior to hospitalization, patient could complete BADL/IADL with modified independent .  Patient will benefit from skilled intervention to decrease level of assist with basic self-care skills and increase independence with basic self-care skills prior to discharge home with care partner.  Anticipate patient will require 24 hour supervision and follow up home health.  OT - End of Session Activity Tolerance: Tolerates 30+ min activity with multiple rests Endurance Deficit: Yes Endurance Deficit Description: decreased OT Assessment Rehab Potential (ACUTE ONLY): Good  OT Barriers to Discharge: Inaccessible home environment OT Patient demonstrates impairments in the following area(s): Balance;Cognition;Edema;Endurance;Motor;Pain;Perception;Safety;Sensory;Vision OT  Basic ADL's Functional Problem(s): Grooming;Bathing;Dressing;Toileting OT Transfers Functional Problem(s): Toilet;Tub/Shower OT Plan OT Intensity: Minimum of 1-2 x/day, 45 to 90 minutes OT Frequency: 5 out of 7 days OT Duration/Estimated Length of Stay: 2-3 weeks OT Treatment/Interventions: Balance/vestibular training;Discharge planning;Pain management;Self Care/advanced ADL retraining;Therapeutic Activities;UE/LE Coordination activities;Visual/perceptual remediation/compensation;Therapeutic Exercise;Skin care/wound managment;Functional mobility training;Patient/family education;Disease mangement/prevention;Cognitive remediation/compensation;Community reintegration;DME/adaptive equipment instruction;Neuromuscular re-education;Psychosocial support;UE/LE Strength taining/ROM;Wheelchair propulsion/positioning OT Self Feeding Anticipated Outcome(s): S OT Basic Self-Care Anticipated Outcome(s): S OT Toileting Anticipated Outcome(s): S OT Bathroom Transfers Anticipated Outcome(s): S OT Recommendation Patient destination: Home Follow Up Recommendations: Home health OT Equipment Recommended: To be determined   Skilled Therapeutic Intervention 1:1. Pt edu re OT role/purpose, CIR, ELOS and POC. Pt received in bed agreeable to OT with no pain. Details of ADL listed below. Pt complete all mobility with no AD stand pivot transfer using bed rails, arm rest and sink EOB<>w/c<>BSC with VC for hand palcement and guiding A for hips. Pt demo poor motot planning during turns. Pt completes bathing, dressing, grooming and toileting at sink at sit to stand level with MIN A to power up into standing and MOD A to maintain balance. Exited session with pt seated in bed, exit alram on and call light in reach. Vitals WNL during sessison  OT Evaluation Precautions/Restrictions  Precautions Precautions: Fall Precaution Comments: cortrak Restrictions Weight Bearing Restrictions: No General   Vital Signs Therapy  Vitals BP: 119/80 Oxygen Therapy SpO2: 98 % O2 Device: Room Air Pain Pain Assessment Pain Scale: 0-10 Pain Score: 0-No pain Home Living/Prior Functioning Home Living Family/patient expects to be discharged to:: Private residence Living Arrangements: Spouse/significant other Available Help at Discharge: Family, Available 24 hours/day Home Access: Stairs to enter Technical brewer of Steps: 2 Entrance Stairs-Rails: None Home Layout: Two level, Able to live on main level with bedroom/bathroom  Lives With: Spouse(most home set-up and PLOF information taken per chart review 2/2 pt's cognition, pt did confirm she lived w/ her husband) Prior Function Level of Independence: Independent with basic ADLs, Independent with transfers, Independent with homemaking with ambulation, Independent with gait  Able to Take Stairs?: Yes Driving: Yes Vocation: Retired Comments: Pt drives, does everything for self PTA ADL ADL Grooming: Minimal cueing Where Assessed-Grooming: Sitting at sink Upper Body Bathing: Moderate cueing Where Assessed-Upper Body Bathing: Sitting at sink Lower Body Bathing: Maximal assistance Where Assessed-Lower Body Bathing: Sitting at sink, Standing at sink Upper Body Dressing: Moderate assistance(for threadind d/t PICC and cortrak) Where Assessed-Upper Body Dressing: Sitting at sink Lower Body Dressing: Maximal assistance Where Assessed-Lower Body Dressing: Standing at sink, Sitting at sink Toileting: Maximal assistance Where Assessed-Toileting: Bedside Commode Toilet Transfer: Moderate assistance Toilet Transfer Method: Stand pivot Toilet Transfer Equipment: Bedside commode Vision Baseline Vision/History: Wears glasses Wears Glasses: At all times Patient Visual Report: No change from baseline Vision Assessment?: Yes;No apparent visual deficits Eye Alignment: Within Functional Limits Ocular Range of Motion: Within Functional Limits Alignment/Gaze Preference:  Within Defined Limits Tracking/Visual Pursuits: Able to track stimulus in all quads without difficulty Saccades: Within functional limits Convergence: Within functional limits Visual Fields: No apparent deficits Perception  Perception: Within Functional Limits Praxis Praxis: Impaired Praxis Impairment Details: Initiation;Motor planning Cognition Overall Cognitive Status: Impaired/Different from baseline Arousal/Alertness: Awake/alert Orientation Level: Person Year: 2021 Month: (unable) Day of Week: Incorrect Memory: Impaired Immediate Memory Recall: Bed;Sock Memory Recall Sock: Not able to recall Memory Recall Blue: Not able to recall  Memory Recall Bed: Not able to recall Attention: Sustained Sustained Attention: Impaired Awareness: Impaired Problem Solving: Impaired Safety/Judgment: Impaired Sensation Sensation Light Touch: Appears Intact(difficult to formally assess 2/2 cognition, appears intact) Coordination Gross Motor Movements are Fluid and Coordinated: No Coordination and Movement Description: Ataxic movements, pt unable to follow motor planning commads for formal testing of heel-to-shin Motor  Motor Motor: Ataxia;Motor apraxia;Abnormal postural alignment and control Motor - Skilled Clinical Observations: generalized weakness as well Mobility  Bed Mobility Bed Mobility: Rolling Left;Rolling Right;Sit to Supine;Supine to Sit Rolling Right: Minimal Assistance - Patient > 75% Rolling Left: Minimal Assistance - Patient > 75% Supine to Sit: Minimal Assistance - Patient > 75% Sit to Supine: Minimal Assistance - Patient > 75% Transfers Sit to Stand: Minimal Assistance - Patient > 75% Stand to Sit: Minimal Assistance - Patient > 75%  Trunk/Postural Assessment  Cervical Assessment Cervical Assessment: Exceptions to WFL(forward head) Thoracic Assessment Thoracic Assessment: Exceptions to WFL(rounded shoulders) Lumbar Assessment Lumbar Assessment: Exceptions to  WFL(posterior pelvic tilt) Postural Control Postural Control: Deficits on evaluation(delayed/absent)  Balance Balance Balance Assessed: Yes Static Sitting Balance Static Sitting - Level of Assistance: 5: Stand by assistance Dynamic Sitting Balance Dynamic Sitting - Level of Assistance: 4: Min assist Static Standing Balance Static Standing - Level of Assistance: 4: Min assist Dynamic Standing Balance Dynamic Standing - Level of Assistance: 3: Mod assist Extremity/Trunk Assessment RUE Assessment RUE Assessment: Exceptions to [facility] General Strength Comments: generalized weakenss LUE Assessment LUE Assessment: Exceptions to [facility] General Strength Comments: generalized weakness     Refer to Care Plan for Long Term Goals  Recommendations for other services: None    Discharge Criteria: Patient will be discharged from OT if patient refuses treatment 3 consecutive times without medical reason, if treatment goals not met, if there is a change in medical status, if patient makes no progress towards goals or if patient is discharged from hospital.  The above assessment, treatment plan, treatment alternatives and goals were discussed and mutually agreed upon: by patient  Tonny Branch 03/11/2020, 12:08 PM

## 2020-03-11 NOTE — Progress Notes (Addendum)
[facility] PHYSICAL MEDICINE & REHABILITATION PROGRESS NOTE  Subjective/Complaints: Patient seen laying in bed this morning.  Overnight, patient noted to have hypoglycemia.  She was also noted attempting to get out of bed by telesitter.  Her NG became unhooked and was restarted.  ROS: Unable to assess due to cognition.  Objective: Vital Signs: Blood pressure 119/80, pulse 87, temperature 98.6 F (37 C), resp. rate 18, SpO2 98 %. No results found. Recent Labs    03/10/20 0500 03/11/20 0451  WBC 12.2* 13.1*  HGB 7.4* 7.8*  HCT 24.0* 25.1*  PLT 268 328   Recent Labs    03/10/20 0500 03/11/20 0451  NA 139 137  K 4.2 4.0  CL 107 106  CO2 24 24  GLUCOSE 200* 139*  BUN 31* 28*  CREATININE 0.83 0.87  CALCIUM 8.7* 8.7*    Physical Exam: BP 119/80   Pulse 87   Temp 98.6 F (37 C)   Resp 18   SpO2 98%  Constitutional: No distress . Vital signs reviewed. HENT: Normocephalic.  Atraumatic.  + NG. Eyes: EOMI. No discharge. Cardiovascular: No JVD. Respiratory: Normal effort.  No stridor. GI: Non-distended. Skin: Warm and dry.  Intact. Psych: Confused. Musc: LE edema Neurological: Alert Not following any commands, spontaneously moving all extremities  Assessment/Plan: 1. Functional deficits secondary to R>L brain bleeds which require 3+ hours per day of interdisciplinary therapy in a comprehensive inpatient rehab setting.  Physiatrist is providing close team supervision and 24 hour management of active medical problems listed below.  Physiatrist and rehab team continue to assess barriers to discharge/monitor patient progress toward functional and medical goals  Care Tool:  Bathing    Body parts bathed by patient: Right arm, Left arm, Chest, Abdomen, Front perineal area, Right upper leg, Left upper leg, Face   Body parts bathed by helper: Buttocks, Right lower leg, Left lower leg     Bathing assist Assist Level: Moderate Assistance - Patient 50 - 74%     Upper  Body Dressing/Undressing Upper body dressing   What is the patient wearing?: Pull over shirt    Upper body assist Assist Level: Moderate Assistance - Patient 50 - 74%    Lower Body Dressing/Undressing Lower body dressing      What is the patient wearing?: Incontinence brief, Pants     Lower body assist Assist for lower body dressing: Total Assistance - Patient < 25%     Toileting Toileting    Toileting assist Assist for toileting: Maximal Assistance - Patient 25 - 49%     Transfers Chair/bed transfer  Transfers assist     Chair/bed transfer assist level: Moderate Assistance - Patient 50 - 74%     Locomotion Ambulation   Ambulation assist      Assist level: Moderate Assistance - Patient 50 - 74% Assistive device: Hand held assist Max distance: 15'   Walk 10 feet activity   Assist     Assist level: Moderate Assistance - Patient - 50 - 74% Assistive device: Hand held assist   Walk 50 feet activity   Assist Walk 50 feet with 2 turns activity did not occur: Safety/medical concerns         Walk 150 feet activity   Assist Walk 150 feet activity did not occur: Safety/medical concerns         Walk 10 feet on uneven surface  activity   Assist Walk 10 feet on uneven surfaces activity did not occur: Safety/medical concerns  Programmer, multimedia activity did not occur: Safety/medical concerns         Wheelchair 50 feet with 2 turns activity    Assist    Wheelchair 50 feet with 2 turns activity did not occur: Safety/medical concerns       Wheelchair 150 feet activity     Assist Wheelchair 150 feet activity did not occur: Safety/medical concerns          Medical Problem List and Plan: 1.  Deficits with mobility, self-care, cognition secondary to R>L brain bleeds.  Begin CIR evaluations 2.  PE/Antithrombotics: -anticoagulation:  Pharmaceutical: Other (comment)On Argatroban as pradaxa  cannot be crushed.               -antiplatelet therapy: N/A 3. Heaches/Pain Management: On depakote tid for agitation as well as HA.               Monitor with increased emulation 4. Mood: LCSW to follow for evaluation and support.              -antipsychotic agents: N/A 5. Neuropsych: This patient is not capable of making decisions on her own behalf.  Telesitter for safety 6. Skin/Wound Care: Routine pressure relief measures.  7. Fluids/Electrolytes/Nutrition: Monitor I/Os.             Advance diet as tolerated 8. 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 Decadron tapered to 4 mg bid on 04/27--slow taper to off.   9. Polycythemia Vera: JAK2 mutation positive. S/p Leukapheresis X 3. Hydrea prn to keep HCT<45     10. HIT:  SRA negative but HIT screen at [facility] strongly positive.   Thrombocytopenia resolved  11. PAF/A fib with RVR:  Felt to be due to illness . Current recommendations are for [facility] for 6-9 months.              Monitor with increased activity 12.  Steroid-induced hyperglycemia on prediabetes: Hgb A1c-6.1. Monitor BS every 4 hours with 5 units for coverage as well as SSI for elevated BS             Monitor with increased mobility 13. Delirium:  With sundowning. On amantadine for activation and Depakote for agitation. Low dose Seroquel resumed 4/29 pm.   14. Anemia: Continue to monitor with daily labs.   Hemoglobin 7.8 on 5/1  Continue to monitor 15.  Post stroke dysphagia  Dysphagia 1, nectar liquids.   Continue tube feeds  Advance diet as tolerated 16.  Essential hypertension  Continue hydralazine 100 3 times daily  Continue losartan 50 twice daily  Monitor with increasing mobility 17.  Leukocytosis-likely steroid-induced  WBCs 13.1 on 5/1  Afebrile 18.  Severe hypoalbuminemia  Supplement initiated 19.  Peripheral edema  Lasix 20 daily started on 5/1  LOS: 1 days A  FACE TO FACE EVALUATION WAS PERFORMED  [name] 03/11/2020, 1:02 PM

## 2020-03-11 NOTE — Evaluation (Signed)
Speech Language Pathology Assessment and Plan  Patient Details  Name: [name] MRN: [id number] Date of Birth: [birth year]  SLP Diagnosis: Aphasia;Dysphagia  Rehab Potential: Good ELOS: 2-3 weeks    Today's Date: 03/11/2020 SLP Individual Time: 1250-1340 SLP Individual Time Calculation (min): 50 min   Problem List:  Patient Active Problem List   Diagnosis Date Noted  . Hypoalbuminemia due to protein-calorie malnutrition ([place])   . Leucocytosis   . Dysphagia, post-stroke   . Anemia   . Prediabetes   . Steroid-induced hyperglycemia   . Embolic stroke ([place]) 73/42/8768  . Dysphagia   . Vascular headache   . Thrombocytosis ([place])   . Venous thrombosis   . On amiodarone therapy   . Polycythemia   . Encounter for central line care   . Nontraumatic subcortical hemorrhage of right cerebral hemisphere ([place])   . Abnormal CT of the chest   . Atrial flutter with rapid ventricular response ([place])   . Pulmonary embolus ([place]) 02/22/2020  . Polycythemia vera ([place]) 02/17/2020  . Likely Cerebral amyloid angiopathy ([place]) 02/17/2020  . IVH (intraventricular hemorrhage) ([place]) 02/14/2020  . TIA (transient ischemic attack) 08/17/2014  . Acute sinusitis 08/16/2014  . Headache 08/15/2014  . Hypothyroidism 08/15/2014  . Hepatitis C   . Hyperlipidemia   . Hypertension    Past Medical History:  Past Medical History:  Diagnosis Date  . Hepatitis C   . Hyperlipidemia   . Hypertension   . Osteoporosis    Past Surgical History:  Past Surgical History:  Procedure Laterality Date  . IR ANGIO INTRA EXTRACRAN SEL INTERNAL CAROTID BILAT MOD SED  02/24/2020  . IR ANGIO VERTEBRAL SEL VERTEBRAL UNI R MOD SED  02/24/2020  . IR ANGIOGRAM SELECTIVE EACH ADDITIONAL VESSEL  02/24/2020  . IR IVC FILTER PLMT / S&I /IMG GUID/MOD SED  02/22/2020  . IR THROMBECT VENO MECH MOD SED  02/24/2020  . IR US GUIDE VASC ACCESS RIGHT  02/24/2020  . IR US GUIDE VASC ACCESS RIGHT  02/24/2020  . IR VENO SAGITTAL SINUS   02/24/2020  . IR VENO/JUGULAR RIGHT  02/24/2020  . RADIOLOGY WITH ANESTHESIA N/A 02/24/2020   Procedure: IR WITH ANESTHESIA;  Surgeon: Radiologist, Medication, MD;  Location: [facility];  Service: Radiology;  Laterality: N/A;    Assessment / Plan / Recommendation Clinical Impression HPI: [name]. Hou is a 74 year old female with history of HTN, Hep C who was admitted on 02/14/20 with onset of severe persistent HA and N/V.  History taken from chart review due to cognition. CT head done revealing acute IVH in right lateral ventricle and severe chronic ischemic microangiopathy.  CBC revealed thrombocytosis with platelets 990, Hgb 17.7 and WBC 12.0.  Neurology question of bleed was HTN versus CVA related.  Dr. Jana Hakim was consulted for input and recommended bone biopsy as well as leukopheresis for work-up of panmyelosis felt to be due to polycytopenia vera.  2D echo showing EF 65-70%.  She was placed on low-dose Cleviprex and was started on pheresis with improvement in cell counts.  Bone biopsy was positive for JAK2 confirming polycythemia vera. She did not meet criteria for von Willebrand disease with borderline low ristocetin cofacto and to be repeated once patient in remission.  She developed lethargy on 04/05 with repeat CT showing IVH without change.  MRI/MRA brain ordered for work-up and showed stable bleed with progression of extensive confluent hyperintensity throughout the brain question due to microangiopathy versus amyloid angiopathy.  She completed leukapheresis  x3 with plans for DCCV  but had worsening of headache on 4/10.  As hemoglobin was up to 17.3, she underwent phlebotomy of 1 units and CTA head done showing patent vessels but suboptimal evaluation due to artifact.  EEG done for work-up and showed moderate diffuse encephalopathy and was negative for seizures.  Clinical swallow evaluation: Patient has a hx of oropharyngeal dysphagia as evidenced by MBS completed on 03/09/20 revealing audible  aspiration of thin liquids. Recommendations were for a pureed diet and nectar thick liquids. This date oral phase of swallowing was functional for consistencies assessed. No overt s/sx of aspiration were observed with nectar thick liquids or pured consistency. Patient politely declined trials of a soft solid and thin liquids despite verbal encouragement. Recommend continue pureed diet and nectar thick liquids.   Speech/language evaluation: Patient presents with expressive and receptive language deficits in the areas of yes/no questions, multi step directions, repetition at the sentence level and naming. Patient participated in the [place] Bedside indicating transcortical sensory aphasia. Western aphasia scores are as stated: Spontaneous speech content 2/10, spontaneous speech fluency 5/10, auditory verbal comprehension 6/10, sequential commands 6/10, repetition 9/10, and object naming 4/10. Bedside aphasia score was a 53. Patient's language was halting with paraphasias and word finding deficits noted. Patient followed basic 1 step directions without difficulty. However, required max verbal and tactile cues to complete 2 step directions. Phonemic and semantic cues were not helpful during a confrontation naming task.   Patient would benefit from skilled SLP services during CIR stay targeting dysphagia and language skills to increase functional independence upon discharge.   Skilled Therapeutic Interventions          Clinical swallow evaluation and speech/language evaluation  SLP Assessment  Patient will need skilled Speech Lanaguage Pathology Services during CIR admission    Recommendations  SLP Diet Recommendations: Nectar;Dysphagia 1 (Puree) Liquid Administration via: Cup;Straw Medication Administration: Crushed with puree Supervision: Staff to assist with self feeding;Full supervision/cueing for compensatory strategies Compensations: Slow rate;Small sips/bites Postural Changes and/or Swallow  Maneuvers: Seated upright 90 degrees Oral Care Recommendations: Oral care BID Patient destination: Home Follow up Recommendations: Home Health SLP    SLP Frequency 3 to 5 out of 7 days   SLP Duration  SLP Intensity  SLP Treatment/Interventions 2-3 weeks  Minumum of 1-2 x/day, 30 to 90 minutes  Cueing hierarchy;Multimodal communication approach;Speech/Language facilitation;Internal/external aids;Therapeutic Exercise;Patient/family education;Dysphagia/aspiration precaution training    Pain Pain Assessment Pain Scale: Faces Pain Score: 0-No pain Faces Pain Scale: No hurt  Prior Functioning Cognitive/Linguistic Baseline: Information not available  SLP Evaluation Cognition Overall Cognitive Status: No family/caregiver present to determine baseline cognitive functioning Arousal/Alertness: Awake/alert Orientation Level: Oriented to person;Oriented to place Attention: Sustained Sustained Attention: Impaired Awareness: Impaired Problem Solving: Impaired Safety/Judgment: Impaired  Comprehension Auditory Comprehension Overall Auditory Comprehension: Impaired Yes/No Questions: Impaired Basic Biographical Questions: 51-75% accurate Basic Immediate Environment Questions: 50-74% accurate Commands: Impaired One Step Basic Commands: 75-100% accurate Two Step Basic Commands: 25-49% accurate Visual Recognition/Discrimination Discrimination: Not tested Reading Comprehension Reading Status: Not tested Expression Expression Primary Mode of Expression: Verbal Verbal Expression Overall Verbal Expression: Impaired Initiation: Impaired Automatic Speech: Name;Social Response Level of Generative/Spontaneous Verbalization: Phrase;Sentence Repetition: Impaired Level of Impairment: Sentence level Naming: Impairment Confrontation: Impaired Verbal Errors: Perseveration Written Expression Dominant Hand: Right Written Expression: Not tested Oral Motor Oral Motor/Sensory  Function Overall Oral Motor/Sensory Function: (Unable to assess) Motor Speech Overall Motor Speech: Appears within functional limits for tasks assessed  Bedside Swallowing Assessment General Diet  Prior to this Study: Dysphagia 1 (puree);Nectar-thick liquids Temperature Spikes Noted: No Respiratory Status: Room air Behavior/Cognition: Alert;Cooperative;Pleasant mood;Requires cueing;Distractible Oral Cavity - Dentition: Adequate natural dentition Self-Feeding Abilities: Needs assist Patient Positioning: Upright in bed Baseline Vocal Quality: Normal  Thin Liquid Other Comments: Patient declined Nectar Thick Nectar Thick Liquid: Within functional limits Presentation: Straw Puree Puree: Within functional limits Presentation: Spoon Solid Other Comments: Patient declined BSE Assessment Risk for Aspiration Impact on safety and function: Moderate aspiration risk;Risk for inadequate nutrition/hydration  Short Term Goals: Week 1: SLP Short Term Goal 1 (Week 1): Patient will tolerate PO trials of thin liquids with no overt s/sx of aspiration. SLP Short Term Goal 2 (Week 1): Patient will consume PO trials of dysphagia 2 textures with adequate mastication and oral clearance with no overt s/sx of aspiration. SLP Short Term Goal 3 (Week 1): Patient will consume current pureed diet and nectar thick liquids with no overt s/sx of aspriation. SLP Short Term Goal 4 (Week 1): Patient will answer conrete yes/no questions with 80% accuracy and max cues. SLP Short Term Goal 5 (Week 1): Patient will follow 2 step directions with 80% accuracy and max cues. SLP Short Term Goal 6 (Week 1): Patient will name simple/fucntional objects with 80% accuracy and max cues.  Refer to Care Plan for Long Term Goals  Recommendations for other services: None   Discharge Criteria: Patient will be discharged from SLP if patient refuses treatment 3 consecutive times without medical reason, if treatment goals not met, if  there is a change in medical status, if patient makes no progress towards goals or if patient is discharged from hospital.  The above assessment, treatment plan, treatment alternatives and goals were discussed and mutually agreed upon: by patient  Cristy Folks 03/11/2020, 2:38 PM

## 2020-03-11 NOTE — Evaluation (Signed)
Physical Therapy Assessment and Plan  Patient Details  Name: [name] MRN: [id number] Date of Birth: 1946-07-07  PT Diagnosis: Abnormality of gait, Ataxia, Ataxic gait, Cognitive deficits, Coordination disorder, Difficulty walking and Muscle weakness Rehab Potential: Good ELOS: 2-3 weeks   Today's Date: 03/11/2020 PT Individual Time: 0850-1000 PT Individual Time Calculation (min): 70 min    Problem List:  Patient Active Problem List   Diagnosis Date Noted  . Embolic stroke ([place]) 66/29/4765  . Dysphagia   . Vascular headache   . Thrombocytosis ([place])   . Venous thrombosis   . On amiodarone therapy   . Polycythemia   . Encounter for central line care   . Nontraumatic subcortical hemorrhage of right cerebral hemisphere ([place])   . Abnormal CT of the chest   . Atrial flutter with rapid ventricular response ([place])   . Pulmonary embolus ([place]) 02/22/2020  . Polycythemia vera ([place]) 02/17/2020  . Likely Cerebral amyloid angiopathy ([place]) 02/17/2020  . IVH (intraventricular hemorrhage) ([place]) 02/14/2020  . TIA (transient ischemic attack) 08/17/2014  . Acute sinusitis 08/16/2014  . Headache 08/15/2014  . Hypothyroidism 08/15/2014  . Hepatitis C   . Hyperlipidemia   . Hypertension     Past Medical History:  Past Medical History:  Diagnosis Date  . Hepatitis C   . Hyperlipidemia   . Hypertension   . Osteoporosis    Past Surgical History:  Past Surgical History:  Procedure Laterality Date  . IR ANGIO INTRA EXTRACRAN SEL INTERNAL CAROTID BILAT MOD SED  02/24/2020  . IR ANGIO VERTEBRAL SEL VERTEBRAL UNI R MOD SED  02/24/2020  . IR ANGIOGRAM SELECTIVE EACH ADDITIONAL VESSEL  02/24/2020  . IR IVC FILTER PLMT / S&I /IMG GUID/MOD SED  02/22/2020  . IR THROMBECT VENO MECH MOD SED  02/24/2020  . IR US GUIDE VASC ACCESS RIGHT  02/24/2020  . IR US GUIDE VASC ACCESS RIGHT  02/24/2020  . IR VENO SAGITTAL SINUS  02/24/2020  . IR VENO/JUGULAR RIGHT  02/24/2020  . RADIOLOGY WITH ANESTHESIA N/A  02/24/2020   Procedure: IR WITH ANESTHESIA;  Surgeon: Radiologist, Medication, MD;  Location: [facility];  Service: Radiology;  Laterality: N/A;    Assessment & Plan Clinical Impression: Patient is a 74 y.o.femalewith history of HTN who was admitted on 02/14/20 with severe persistent HA and N/V. CT head revealed acute IVH in right lateral ventricle with severe chronic ischemic microangiopathy. CBC revealed thrombocytosis with plt- 990, hgb 17.7 and WBC 12.0. Neurology questioned if bleed HTN v/s CAA related. Dr. Jana Hakim was consulted for input and recommended bone biopsy as well as leukopheresis for work up of panmyelosis felt to be due to polycythemia vera. 2D echo done revealing EF 65-70% with no wall abnormality. She was placed on low dose cleviprex and was started on pheresis with improvement in cell counts. Bone biopsy positive for JAK2 confirming polycythemia vera-- did not meet criteria for von Willebrand disease with borderline low ristocetin cofactor -->to be repeated once patient in remission. She developed lethargy 4/5 with repeat CT head showed IVH without change. MRI/MRA brain ordered for work up and showed stable bleed and progression of extensive confluent hyperintensity thorough out the brain question due to microangiopathy v/s amyloid angiopathy. She completed leukopheresis X 3 with plans for d/c but had worsening of HA on 4/10. As hgb up to 17.3, she underwent phlebotomy of one unit and CTA head done showing patent vessels but suboptimal evaluation due to artifact. EEG done which showed moderate diffuse encephalopathy  and was negative for seizures .  Depakote was added with some improvement in HA. She developed SOB with intermittent tachycardia and CTA chest done showing acute BLL emboli and emphysematous changes seen in BUL. BLE dopplers were negative for DVT. IVC filter placed by Dr. Anselm Pancoast on 4/13 due to [place]. She developed A fib with RVR with short runs of NSVT on 04/14. Cardiology consulted  for input and recommended IV metoprolol with question of cardioversion due to hypotension. She was transferred to ICU for monitoring and required IV amiodarone for rate recurrent flutter. She developed increased in HA with drop in platelets to <24,00 and IV solumedrol added. Work up positive for HIT. CT head repeated 4/15 showing hypodensity in straight sinus and follow up CT venogram done revealing extensive thrombus within confluence of sinuses and straight sinus extending into vein of [place]en and fairly extensive multifocal thrombus within R>L transverse and sigmoid dural venous sinuses. She underwent emergent thrombectomy with recanalization of deep system with decrease in clot burden. Follow up MRI brain 4/16 showed numerous tiny acute infarcts in bilateral cerebral and cerebellar hemispheres felt to be embolic, 2.3 cm acute cortical infarct right temporal occipital lobes likely venous infarct, new region of parenchymal swelling left frontal lobe and frontal operculum and unchanged subacute IVH without hydrocephalus. She was started on hypertonic saline for edema control and case discussed with Hem/Onc who recommended lower therapeutic range of Argotroban. Mentation continues to fluctuate with bouts of agitation and lethargy. On tube feeds for nutritional supplement. Once she is taking pos normally, hemoc will d/c argatroban and start DOACS. Patient transferred to CIR on 03/10/2020 .   Patient currently requires mod with mobility secondary to muscle weakness, decreased cardiorespiratoy endurance, unbalanced muscle activation, motor apraxia, ataxia, decreased coordination and decreased motor planning, decreased initiation, decreased attention, decreased awareness, decreased problem solving, decreased safety awareness, decreased memory and delayed processing and decreased standing balance, decreased postural control and decreased balance strategies.  Prior to hospitalization, patient was independent  with  mobility and lived with Spouse(most home set-up and PLOF information taken per chart review 2/2 pt's cognition, pt did confirm she lived w/ her husband) in a   home.  Home access is 2Stairs to enter.  Patient will benefit from skilled PT intervention to maximize safe functional mobility, minimize fall risk and decrease caregiver burden for planned discharge home with 24 hour supervision.  Anticipate patient will benefit from follow up [place] at discharge.  PT - End of Session Activity Tolerance: Tolerates < 10 min activity, no significant change in vital signs Endurance Deficit: Yes Endurance Deficit Description: decreased PT Assessment Rehab Potential (ACUTE/IP ONLY): Good PT Barriers to Discharge: Medical stability;Nutrition means;Behavior;Inaccessible home environment PT Patient demonstrates impairments in the following area(s): Balance;Edema;Endurance;Motor;Nutrition;Perception;Safety;Behavior PT Transfers Functional Problem(s): Bed Mobility;Bed to Chair;Car;Furniture;Floor PT Locomotion Functional Problem(s): Stairs;Wheelchair Mobility;Ambulation PT Plan PT Intensity: Minimum of 1-2 x/day ,45 to 90 minutes PT Frequency: 5 out of 7 days PT Duration Estimated Length of Stay: 2-3 weeks PT Treatment/Interventions: Ambulation/gait training;Community reintegration;Neuromuscular re-education;DME/adaptive equipment instruction;Psychosocial support;Stair training;UE/LE Strength taining/ROM;Wheelchair propulsion/positioning;UE/LE Coordination activities;Therapeutic Activities;Skin care/wound management;Pain management;Functional electrical stimulation;Balance/vestibular training;Discharge planning;Disease management/prevention;Cognitive remediation/compensation;Functional mobility training;Patient/family education;Splinting/orthotics;Therapeutic Exercise;Visual/perceptual remediation/compensation PT Transfers Anticipated Outcome(s): supervision PT Locomotion Anticipated Outcome(s): supervision short  distance gait w/ LRAD PT Recommendation Follow Up Recommendations: Home health PT Patient destination: Home Equipment Recommended: To be determined  Skilled Therapeutic Intervention  Pt sitting up in bed, eating breakfast w/ NT. Therapist took over from NT and provided encouragement for pt to take  a few more bites. Hand-over-hand assist to self-feed however pt declining anything further to eat. She states "I'm not hungry". Supine>sit w/ min assist and pt requested to toilet. Static sitting balance w/ close supervision while therapist set-up BSC. Stand pivot to [facility] w/ mod assist w/ verbal and tactile cues for technique, sequencing, and UE placement. Max assist for LE garment management. Pt continent of void and performed pericare w/ supervision. Stand pivot to w/c w/ mod assist. Total assist w/c transport to/from therapy gym for time management. Sit>stand w/ mod assist and ambulated 15' w/ mod assist via HHA x1 (on pt's R side). 2nd helper stand-by for safety and to manage IV pole. Mod verbal and tactile cues for sequencing w/ turning to sit in chair. Performed car transfer w/ mod assist, verbal and tactile cues again for technique and sequencing. Pt w/ slower processing towards end of session, requesting to lay down. Returned to room total assist. Stand pivot to EOB w/ min assist. Ended session in supine, all needs in reach. Husband and son present at end of session. Instructed pt, husband, and son in results of PT evaluation as detailed below, PT POC, rehab potential, rehab goals, and discharge recommendations.   PT Evaluation Precautions/Restrictions Precautions Precautions: Fall Precaution Comments: cortrak Restrictions Weight Bearing Restrictions: No General   Vital SignsTherapy Vitals BP: 119/80 Oxygen Therapy SpO2: 98 % O2 Device: Room Air Home Living/Prior Functioning Home Living Available Help at Discharge: Family;Available 24 hours/day Home Access: Stairs to enter Entrance  Stairs-Number of Steps: 2 Entrance Stairs-Rails: None Home Layout: Two level;Able to live on main level with bedroom/bathroom  Lives With: Spouse Prior Function Level of Independence: Independent with basic ADLs;Independent with transfers;Independent with homemaking with ambulation;Independent with gait  Able to Take Stairs?: Yes Driving: Yes Vocation: Retired Comments: Pt drives, does everything for self PTA Vision/Perception  Vision - Assessment Eye Alignment: Within Functional Limits Ocular Range of Motion: Within Functional Limits Alignment/Gaze Preference: Within Defined Limits Tracking/Visual Pursuits: Able to track stimulus in all quads without difficulty Saccades: Within functional limits Convergence: Within functional limits Perception Perception: Within Functional Limits Praxis Praxis: Impaired Praxis Impairment Details: Initiation;Motor planning  Cognition   Sensation Sensation Light Touch: Appears Intact(difficult to formally assess 2/2 cognition, appears intact) Coordination Gross Motor Movements are Fluid and Coordinated: No Coordination and Movement Description: Ataxic movements, pt unable to follow motor planning commads for formal testing of heel-to-shin Motor  Motor Motor: Ataxia;Motor apraxia;Abnormal postural alignment and control Motor - Skilled Clinical Observations: generalized weakness as well  Mobility Bed Mobility Bed Mobility: Rolling Left;Rolling Right;Sit to Supine;Supine to Sit Rolling Right: Minimal Assistance - Patient > 75% Rolling Left: Minimal Assistance - Patient > 75% Supine to Sit: Minimal Assistance - Patient > 75% Sit to Supine: Minimal Assistance - Patient > 75% Transfers Transfers: Stand to Sit;Sit to Stand;Stand Pivot Transfers Sit to Stand: Minimal Assistance - Patient > 75% Stand to Sit: Minimal Assistance - Patient > 75% Stand Pivot Transfers: Moderate Assistance - Patient 50 - 74% Stand Pivot Transfer Details: Manual  facilitation for placement;Manual facilitation for weight shifting;Tactile cues for initiation;Tactile cues for posture;Verbal cues for technique Transfer (Assistive device): None Locomotion  Gait Ambulation: Yes Gait Assistance: Moderate Assistance - Patient 50-74% Gait Distance (Feet): 15 Feet Assistive device: 1 person hand held assist Gait Assistance Details: Verbal cues for gait pattern;Manual facilitation for weight shifting Gait Gait: Yes Gait Pattern: Impaired Gait Pattern: Shuffle;Ataxic;Narrow base of support;Poor foot clearance - left;Poor foot clearance - right Gait velocity:  decreased Stairs / Additional Locomotion Stairs: No Wheelchair Mobility Wheelchair Mobility: No  Trunk/Postural Assessment  Cervical Assessment Cervical Assessment: Exceptions to WFL(forward head) Thoracic Assessment Thoracic Assessment: Exceptions to WFL(rounded shoulders) Lumbar Assessment Lumbar Assessment: Exceptions to WFL(posterior pelvic tilt) Postural Control Postural Control: Deficits on evaluation(delayed/absent)  Balance Balance Balance Assessed: Yes Static Sitting Balance Static Sitting - Level of Assistance: 5: Stand by assistance Dynamic Sitting Balance Dynamic Sitting - Level of Assistance: 4: Min assist Static Standing Balance Static Standing - Level of Assistance: 4: Min assist Dynamic Standing Balance Dynamic Standing - Level of Assistance: 3: Mod assist Extremity Assessment  RLE Assessment RLE Assessment: Exceptions to [facility] Passive Range of Motion (PROM) Comments: [name] General Strength Comments: Hip strength 4/5, knee and ankle WFL LLE Assessment LLE Assessment: Exceptions to [facility] Passive Range of Motion (PROM) Comments: [facility] General Strength Comments: Hip strength 4/5, knee and ankle WFL    Refer to Care Plan for Long Term Goals  Recommendations for other services: None   Discharge Criteria: Patient will be discharged from PT if patient refuses treatment 3  consecutive times without medical reason, if treatment goals not met, if there is a change in medical status, if patient makes no progress towards goals or if patient is discharged from hospital.  The above assessment, treatment plan, treatment alternatives and goals were discussed and mutually agreed upon: by patient and by family  Latacha Texeira K Olivianna Higley 03/11/2020, 10:12 AM

## 2020-03-11 NOTE — Progress Notes (Signed)
Normal for Argatroban Indication: HIT  Allergies  Allergen Reactions  . Erythromycin Itching and Other (See Comments)    Severe stomach pains, diarrhea  . Heparin     Likely HIT, SRA negative    Patient Measurements: Weight: (bed not zeroed. Inaccurate weight. )  Vital Signs: Temp: 98.6 F (37 C) (05/01 0409) BP: 151/60 (05/01 0409) Pulse Rate: 87 (05/01 0409)  Labs: Recent Labs    03/09/20 0500 03/09/20 1659 03/10/20 0500 03/10/20 1553 03/11/20 0451  HGB 7.7*  --  7.4*  --  7.8*  HCT 24.8*  --  24.0*  --  25.1*  PLT 223  --  268  --  328  APTT 55*   < > 52* 52* 51*  CREATININE 0.83  --  0.83  --  0.87   < > = values in this interval not displayed.    Estimated Creatinine Clearance: 54.8 mL/min (by C-G formula based on SCr of 0.87 mg/dL).  Assessment: 74 year old initially admitted 4/4 with acute IVH and thrombocythemia with PLTC of 990k. Patient was ultimately diagnosed with polythemia, now s/p leukophoresis, therapeutic phlebotomy, and initiation of hydroxyurea. On 4/13 - found to have B/L PE - decision was risk of AC was too great and IVC placed. CT scan 4/15 showed extensive thrombus within confluence of sinuses and sent to IR with mechanical thrombectomy x5.  SRA resulted negative, Dr. Jana Hakim ordered repeat sent to different lab - still with working diagnosis of HIT d/t high pre-test probability. Also need to look into other causes for new TCP. Platelets are now WNL.  APTT remains therapeutic at 51 sec on agatroban 0.25mcg/kg/min (goal 50-60 seconds). Currently using a dosing weight of 79.7 kg.  Hemoglobin increased to 7.8 today, no issues with bleeding reported per RN.  Goal of Therapy:  aPTT 50 - 60 seconds Monitor platelets by anticoagulation protocol: Yes   Plan: Continue argatroban at 0.65 mcg/kg/min  Continue q12h aPTT checks Monitor daily CBC, s/sx bleeding Follow-up plans switching to DOAC     Brittanyann Wittner  L. Devin Going, [place] PGY1 Pharmacy Resident [phone] 03/11/20     8:10 AM  Please check AMION for all [place] phone numbers After 10:00 PM, call the [place] [phone]

## 2020-03-11 NOTE — Progress Notes (Addendum)
NT advised me this patient's Q4 CBG at 0000hr was 55 and patient's R AC IV dressing was saturated in blood.  I instituted the hypoglycemic protocol order set and administered the prescribed iv dextrose.  I also removed the iv and placed a clean and dry gauze and tape with gauze wrap over the are to control bleeding.  Addendum:  Upon recheck patient's cbg was 62

## 2020-03-11 NOTE — Progress Notes (Signed)
Was notified by telesitter that pt was trying to exit bed.   Patient stated she was trying to get to the bathroom.  Upon assessment I noticed that the patient's NG tube had become disconnected at the 3 way valve and had leaked on the bed.  After getting the patient on the bsc and reconnecting the ng tube the nt and myself changed the bed and cleaned the patient before placing her back in bed.  She is now resting comfortably.  Telesitter reactivated.

## 2020-03-12 DIAGNOSIS — R609 Edema, unspecified: Secondary | ICD-10-CM

## 2020-03-12 DIAGNOSIS — R600 Localized edema: Secondary | ICD-10-CM

## 2020-03-12 DIAGNOSIS — F05 Delirium due to known physiological condition: Secondary | ICD-10-CM

## 2020-03-12 DIAGNOSIS — D72829 Elevated white blood cell count, unspecified: Secondary | ICD-10-CM

## 2020-03-12 DIAGNOSIS — R4587 Impulsiveness: Secondary | ICD-10-CM

## 2020-03-12 DIAGNOSIS — R799 Abnormal finding of blood chemistry, unspecified: Secondary | ICD-10-CM

## 2020-03-12 LAB — BASIC METABOLIC PANEL
Anion gap: 10 (ref 5–15)
BUN: 25 mg/dL — ABNORMAL HIGH (ref 8–23)
CO2: 20 mmol/L — ABNORMAL LOW (ref 22–32)
Calcium: 9 mg/dL (ref 8.9–10.3)
Chloride: 106 mmol/L (ref 98–111)
Creatinine, Ser: 0.79 mg/dL (ref 0.44–1.00)
GFR calc Af Amer: >60 mL/min (ref >60)
GFR calc non Af Amer: >60 mL/min (ref >60)
Glucose, Bld: 137 mg/dL — ABNORMAL HIGH (ref 70–99)
Potassium: 4.3 mmol/L (ref 3.5–5.1)
Sodium: 136 mmol/L (ref 135–145)

## 2020-03-12 LAB — GLUCOSE, CAPILLARY
Glucose-Capillary: 127 mg/dL — ABNORMAL HIGH (ref 70–99)
Glucose-Capillary: 94 mg/dL (ref 70–99)
Glucose-Capillary: 144 mg/dL — ABNORMAL HIGH (ref 70–99)
Glucose-Capillary: 132 mg/dL — ABNORMAL HIGH (ref 70–99)
Glucose-Capillary: 124 mg/dL — ABNORMAL HIGH (ref 70–99)

## 2020-03-12 LAB — TRIGLYCERIDES: Triglycerides: 101 mg/dL (ref <150)

## 2020-03-12 LAB — CBC
HCT: 28.9 % — ABNORMAL LOW (ref 36.0–46.0)
Hemoglobin: 8.8 g/dL — ABNORMAL LOW (ref 12.0–15.0)
MCH: 31.5 pg (ref 26.0–34.0)
MCHC: 30.4 g/dL (ref 30.0–36.0)
MCV: 103.6 fL — ABNORMAL HIGH (ref 80.0–100.0)
Platelets: 449 10*3/uL — ABNORMAL HIGH (ref 150–400)
RBC: 2.79 MIL/uL — ABNORMAL LOW (ref 3.87–5.11)
RDW: 20.9 % — ABNORMAL HIGH (ref 11.5–15.5)
WBC: 14.6 10*3/uL — ABNORMAL HIGH (ref 4.0–10.5)
nRBC: 0 % (ref 0.0–0.2)

## 2020-03-12 LAB — APTT
aPTT: 53 seconds — ABNORMAL HIGH (ref 24–36)
aPTT: 55 seconds — ABNORMAL HIGH (ref 24–36)

## 2020-03-12 NOTE — Progress Notes (Signed)
[place] for Argatroban Indication: HIT  Allergies  Allergen Reactions  . Erythromycin Itching and Other (See Comments)    Severe stomach pains, diarrhea  . Heparin     Likely HIT, SRA negative    Patient Measurements: Weight: (bed not zeroed. Inaccurate weight. )  Vital Signs: Temp: 98 F (36.7 C) (05/02 0353) BP: 138/75 (05/02 0353) Pulse Rate: 87 (05/02 0353)  Labs: Recent Labs    03/10/20 0500 03/10/20 1553 03/11/20 0451 03/11/20 1900 03/12/20 0536  HGB 7.4*  --  7.8*  --  8.8*  HCT 24.0*  --  25.1*  --  28.9*  PLT 268  --  328  --  449*  APTT 52*   < > 51* 55* 53*  CREATININE 0.83  --  0.87  --  0.79   < > = values in this interval not displayed.    Estimated Creatinine Clearance: 59.6 mL/min (by C-G formula based on SCr of 0.79 mg/dL).  Assessment: 74 year old initially admitted 4/4 with acute IVH and thrombocythemia with PLTC of 990k. Patient was ultimately diagnosed with polythemia, now s/p leukophoresis, therapeutic phlebotomy, and initiation of hydroxyurea. On 4/13 - found to have B/L PE - decision was risk of AC was too great and IVC placed. CT scan 4/15 showed extensive thrombus within confluence of sinuses and sent to IR with mechanical thrombectomy x5.  SRA resulted negative, Dr. Jana Hakim ordered repeat sent to different lab - still with working diagnosis of HIT d/t high pre-test probability. Also need to look into other causes for new TCP. Platelets are now WNL.  APTT remains therapeutic at 53 sec on agatroban 0.48mcg/kg/min (goal 50-60 seconds). Currently using a dosing weight of 79.7 kg.  Hemoglobin increased to 8.8 today, no issues with bleeding reported per RN.  Goal of Therapy:  aPTT 50 - 60 seconds Monitor platelets by anticoagulation protocol: Yes   Plan: Continue argatroban at 0.65 mcg/kg/min  Continue q12h aPTT checks Monitor daily CBC, s/sx bleeding Follow-up plans switching to DOAC     [name]  L. Devin Going, [place] PGY1 Pharmacy Resident [phone] 03/12/20     7:25 AM  Please check AMION for all [place] phone numbers After 10:00 PM, call the [place] [phone]

## 2020-03-12 NOTE — Progress Notes (Signed)
[facility] PHYSICAL MEDICINE & REHABILITATION PROGRESS NOTE  Subjective/Complaints: Patient seen sitting up in bed this morning.  Discussed with nursing, patient restless and perseverative earlier in the night, but then slept well afterward.  ROS: Unable to assess due to cognition  Objective: Vital Signs: Blood pressure 122/77, pulse 79, temperature 98 F (36.7 C), resp. rate 17, SpO2 99 %. No results found. Recent Labs    03/11/20 0451 03/12/20 0536  WBC 13.1* 14.6*  HGB 7.8* 8.8*  HCT 25.1* 28.9*  PLT 328 449*   Recent Labs    03/11/20 0451 03/12/20 0536  NA 137 136  K 4.0 4.3  CL 106 106  CO2 24 20*  GLUCOSE 139* 137*  BUN 28* 25*  CREATININE 0.87 0.79  CALCIUM 8.7* 9.0    Physical Exam: BP 122/77   Pulse 79   Temp 98 F (36.7 C)   Resp 17   SpO2 99%  Constitutional: No distress . Vital signs reviewed. HENT: Normocephalic.  Atraumatic.  + NG. Eyes: EOMI. No discharge. Cardiovascular: No JVD. Respiratory: Normal effort.  No stridor. GI: Non-distended. Skin: Warm and dry.  Intact. Psych: Restless.  Impulsive.  Confused. Musc: Lower extremity edema improving. Neurological: Alert Limited commands, bilateral lower extremities >/ 3/5  Assessment/Plan: 1. Functional deficits secondary to R>L brain bleeds which require 3+ hours per day of interdisciplinary therapy in a comprehensive inpatient rehab setting.  Physiatrist is providing close team supervision and 24 hour management of active medical problems listed below.  Physiatrist and rehab team continue to assess barriers to discharge/monitor patient progress toward functional and medical goals  Care Tool:  Bathing    Body parts bathed by patient: Right arm, Left arm, Chest, Abdomen, Front perineal area, Right upper leg, Left upper leg, Face   Body parts bathed by helper: Buttocks, Right lower leg, Left lower leg     Bathing assist Assist Level: Moderate Assistance - Patient 50 - 74%     Upper Body  Dressing/Undressing Upper body dressing   What is the patient wearing?: Pull over shirt    Upper body assist Assist Level: Moderate Assistance - Patient 50 - 74%    Lower Body Dressing/Undressing Lower body dressing      What is the patient wearing?: Incontinence brief, Underwear/pull up     Lower body assist Assist for lower body dressing: Total Assistance - Patient < 25%     Toileting Toileting    Toileting assist Assist for toileting: Maximal Assistance - Patient 25 - 49%     Transfers Chair/bed transfer  Transfers assist     Chair/bed transfer assist level: Moderate Assistance - Patient 50 - 74%     Locomotion Ambulation   Ambulation assist      Assist level: Moderate Assistance - Patient 50 - 74% Assistive device: Hand held assist Max distance: 15'   Walk 10 feet activity   Assist     Assist level: Moderate Assistance - Patient - 50 - 74% Assistive device: Hand held assist   Walk 50 feet activity   Assist Walk 50 feet with 2 turns activity did not occur: Safety/medical concerns         Walk 150 feet activity   Assist Walk 150 feet activity did not occur: Safety/medical concerns         Walk 10 feet on uneven surface  activity   Assist Walk 10 feet on uneven surfaces activity did not occur: Safety/medical concerns  Programmer, multimedia activity did not occur: Safety/medical concerns         Wheelchair 50 feet with 2 turns activity    Assist    Wheelchair 50 feet with 2 turns activity did not occur: Safety/medical concerns       Wheelchair 150 feet activity     Assist Wheelchair 150 feet activity did not occur: Safety/medical concerns          Medical Problem List and Plan: 1.  Deficits with mobility, self-care, cognition secondary to R>L brain bleeds.  Continue CIR 2.  PE/Antithrombotics: -anticoagulation:  Pharmaceutical: Other (comment)On Argatroban as pradaxa cannot  be crushed.               -antiplatelet therapy: N/A 3. Heaches/Pain Management: On depakote tid for agitation as well as HA.    Appears controlled on 5/2             Monitor with increased emulation 4. Mood: LCSW to follow for evaluation and support.              -antipsychotic agents: N/A 5. Neuropsych: This patient is not capable of making decisions on her own behalf.  Telesitter for safety 6. Skin/Wound Care: Routine pressure relief measures.  7. Fluids/Electrolytes/Nutrition: Monitor I/Os.             Advance diet as tolerated 8. 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 Decadron tapered to 4 mg bid on 04/27--slow taper to off.   9. Polycythemia Vera: JAK2 mutation positive. S/p Leukapheresis X 3. Hydrea prn to keep HCT<45     10. HIT:  SRA negative but HIT screen at [facility] strongly positive.   Thrombocytopenia resolved  11. PAF/A fib with RVR:  Felt to be due to illness . Current recommendations are for [facility] for 6-9 months.   Controlled on 5/2             Monitor with increased activity 12.  Steroid-induced hyperglycemia on prediabetes: Hgb A1c-6.1. Monitor BS every 4 hours with 5 units for coverage as well as SSI for elevated BS  Relatively controlled on 5/2             Monitor with increased mobility 13. Delirium:  With sundowning. On amantadine for activation and Depakote for agitation. Low dose Seroquel resumed 4/29 pm.  May need to schedule Seroquel of persistent sundowning  14. Anemia: Continue to monitor with daily labs.   Hemoglobin 8.8 on 5/2, questionable reliability, labs ordered for tomorrow  Continue to monitor 15.  Post stroke dysphagia  Dysphagia 1, nectar liquids.   Continue tube feeds  Advance diet as tolerated 16.  Essential hypertension  Continue hydralazine 100 3 times daily  Continue losartan 50 twice daily  Controlled on 5/2  Monitor with increasing  mobility 17.  Leukocytosis-likely steroid-induced  WBCs 14.6 on 5/2,?  Reliability, labs ordered for tomorrow  Afebrile 18.  Severe hypoalbuminemia  Supplement initiated 19.  Peripheral edema  Lasix 20 daily started on 5/1 20.  Elevated BUN  Encourage fluids  May need to adjust diuretics if trends up, labs ordered for tomorrow  LOS: 2 days A FACE TO FACE EVALUATION WAS PERFORMED  Keshayla Schrum Lorie Phenix 03/12/2020, 9:34 AM

## 2020-03-12 NOTE — Progress Notes (Addendum)
Restless 1900-2230, multi attempts OOB. Requiring 1:1 supervision for safety. Telesitter in place.  Difficult to reorient, perseverating on calling [name], to pick her up. PRN seroquel given at 2105.  Continent and incontinent of urine. Rested better after 2300. Glucerna infusing without problems. 2 + pitting edema to BLE's.  Patrici Ranks A

## 2020-03-12 NOTE — Progress Notes (Signed)
[place] for Argatroban Indication: HIT  Allergies  Allergen Reactions  . Erythromycin Itching and Other (See Comments)    Severe stomach pains, diarrhea  . Heparin     Likely HIT, SRA negative    Patient Measurements: Weight: (bed not zeroed. Inaccurate weight. )  Vital Signs: Temp: 98 F (36.7 C) (05/02 1346) BP: 128/54 (05/02 1346) Pulse Rate: 87 (05/02 1346)  Labs: Recent Labs    03/10/20 0500 03/10/20 1553 03/11/20 0451 03/11/20 0451 03/11/20 1900 03/12/20 0536 03/12/20 1641  HGB 7.4*  --  7.8*  --   --  8.8*  --   HCT 24.0*  --  25.1*  --   --  28.9*  --   PLT 268  --  328  --   --  449*  --   APTT 52*   < > 51*   < > 55* 53* 55*  CREATININE 0.83  --  0.87  --   --  0.79  --    < > = values in this interval not displayed.    Estimated Creatinine Clearance: 59.6 mL/min (by C-G formula based on SCr of 0.79 mg/dL).  Assessment: 74 year old initially admitted 4/4 with acute IVH and thrombocythemia with PLTC of 990k. Patient was ultimately diagnosed with polythemia, now s/p leukophoresis, therapeutic phlebotomy, and initiation of hydroxyurea. On 4/13 - found to have B/L PE - decision was risk of AC was too great and IVC placed. CT scan 4/15 showed extensive thrombus within confluence of sinuses and sent to IR with mechanical thrombectomy x5.  SRA resulted negative, Dr. Jana Hakim ordered repeat sent to different lab - still with working diagnosis of HIT d/t high pre-test probability. Also need to look into other causes for new TCP. Platelets are now WNL.  APTT remains therapeutic at 55 sec on agatroban 0.47mcg/kg/min (goal 50-60 seconds). Currently using a dosing weight of 79.7 kg.  Goal of Therapy:  aPTT 50 - 60 seconds Monitor platelets by anticoagulation protocol: Yes   Plan: Continue argatroban at 0.65 mcg/kg/min  Continue q12h aPTT checks Monitor daily CBC, s/sx bleeding Follow-up plans switching to  [place], PharmD., BCPS, BCCCP Clinical Pharmacist Clinical phone for 03/12/20 until [phone] If after 10pm, please refer to [facility] for unit-specific pharmacist

## 2020-03-13 ENCOUNTER — Inpatient Hospital Stay (HOSPITAL_COMMUNITY): Payer: Medicare HMO | Admitting: Occupational Therapy

## 2020-03-13 ENCOUNTER — Inpatient Hospital Stay (HOSPITAL_COMMUNITY): Payer: Medicare HMO | Admitting: Physical Therapy

## 2020-03-13 ENCOUNTER — Inpatient Hospital Stay (HOSPITAL_COMMUNITY): Payer: Medicare HMO | Admitting: Speech Pathology

## 2020-03-13 LAB — GLUCOSE, CAPILLARY
Glucose-Capillary: 109 mg/dL — ABNORMAL HIGH (ref 70–99)
Glucose-Capillary: 118 mg/dL — ABNORMAL HIGH (ref 70–99)
Glucose-Capillary: 133 mg/dL — ABNORMAL HIGH (ref 70–99)
Glucose-Capillary: 139 mg/dL — ABNORMAL HIGH (ref 70–99)
Glucose-Capillary: 157 mg/dL — ABNORMAL HIGH (ref 70–99)
Glucose-Capillary: 98 mg/dL (ref 70–99)

## 2020-03-13 LAB — BASIC METABOLIC PANEL
Anion gap: 11 (ref 5–15)
BUN: 21 mg/dL (ref 8–23)
CO2: 24 mmol/L (ref 22–32)
Calcium: 8.6 mg/dL — ABNORMAL LOW (ref 8.9–10.3)
Chloride: 100 mmol/L (ref 98–111)
Creatinine, Ser: 0.79 mg/dL (ref 0.44–1.00)
GFR calc Af Amer: >60 mL/min (ref >60)
GFR calc non Af Amer: >60 mL/min (ref >60)
Glucose, Bld: 125 mg/dL — ABNORMAL HIGH (ref 70–99)
Potassium: 4.1 mmol/L (ref 3.5–5.1)
Sodium: 135 mmol/L (ref 135–145)

## 2020-03-13 LAB — CBC
HCT: 25 % — ABNORMAL LOW (ref 36.0–46.0)
Hemoglobin: 7.8 g/dL — ABNORMAL LOW (ref 12.0–15.0)
MCH: 31.8 pg (ref 26.0–34.0)
MCHC: 31.2 g/dL (ref 30.0–36.0)
MCV: 102 fL — ABNORMAL HIGH (ref 80.0–100.0)
Platelets: 445 10*3/uL — ABNORMAL HIGH (ref 150–400)
RBC: 2.45 MIL/uL — ABNORMAL LOW (ref 3.87–5.11)
RDW: 20.7 % — ABNORMAL HIGH (ref 11.5–15.5)
WBC: 11.6 10*3/uL — ABNORMAL HIGH (ref 4.0–10.5)
nRBC: 0 % (ref 0.0–0.2)

## 2020-03-13 LAB — APTT
aPTT: 52 seconds — ABNORMAL HIGH (ref 24–36)
aPTT: 46 seconds — ABNORMAL HIGH (ref 24–36)

## 2020-03-13 MED ORDER — PANTOPRAZOLE SODIUM 40 MG IV SOLR
40.0000 mg | INTRAVENOUS | Status: DC
  Administered 2020-03-13 – 2020-03-15 (×3): 40 mg via INTRAVENOUS
  Filled 2020-03-13 (×3): qty 40

## 2020-03-13 MED ORDER — GLUCERNA 1.2 CAL PO LIQD
1000.0000 mL | ORAL | Status: DC
  Administered 2020-03-13 – 2020-03-14 (×2): 1000 mL
  Filled 2020-03-13 (×3): qty 1000

## 2020-03-13 NOTE — Progress Notes (Signed)
Initial Nutrition Assessment  DOCUMENTATION CODES:   Not applicable  INTERVENTION:   - Magic Cup TID with meals, each supplement provides 290 kcal and 9 grams of protein  Continue tube feeds via Cortrak: - Glucerna 1.2 @ 70 ml/hr (can be held for up to 4 hours for therapies) - Pro-stat 30 ml daily  Tube feeding regimen provides 1780 kcal, 99 grams of protein, and 1127 ml of H2O.   NUTRITION DIAGNOSIS:   Inadequate oral intake related to lethargy/confusion, dysphagia as evidenced by meal completion < 50%.  GOAL:   Patient will meet greater than or equal to 90% of their needs  MONITOR:   PO intake, Supplement acceptance, Weight trends, TF tolerance, Labs, Diet advancement  REASON FOR ASSESSMENT:   Consult Enteral/tube feeding initiation and management  ASSESSMENT:   74 year old female with PMH of HTN and hepatitis C. Pt admitted on 02/14/20 with onset of severe persistent HA and N/V. CT head done revealing acute IVH. Bone biopsy was positive for JAK2 confirming polycythemia vera. IVC filter was placed on 04/13 due to [place]. Pt underwent emergent thrombectomy with recanalization of deep system and decrease in clot burden. She continues on tube feeds for nutritional support. Pt admitted to CIR on 03/10/20.   Diet advanced to Dysphagia 2 with thin liquids this morning.  Unable to speak with pt at this time. Other providers in room at times of RD's attempted visits.  RD will adjust pt's tube feeds so that they can be held for therapies.  Per RN edema assessment, pt with moderate pitting edema to BLE.  Limited weight history available in chart. Pt with a 2.7 kg weight loss since 03/10/20. This is a 3.6% weight loss in less than 1 week which is significant for timeframe. Pt is at risk for malnutrition.  Meal Completion: 10-25% x 5 recorded meals  Current TF: Glucerna 1.2 @ 60 ml/hr, Pro-stat 30 ml daily  Medications reviewed and include: decadron, Lasix, SSI q 4 hours, Novolog 5  units q 4 hours, protonix, KCl 4 mEq daily, senna  Labs reviewed: hemoglobin 7.8 CBG's: 98-157 x 24 hours  NUTRITION - FOCUSED PHYSICAL EXAM:  Unable to complete at this time.  Diet Order:   Diet Order            DIET DYS 2 Room service appropriate? Yes; Fluid consistency: Thin  Diet effective now              EDUCATION NEEDS:   Not appropriate for education at this time  Skin:  Skin Assessment: Reviewed RN Assessment  Last BM:  03/12/20 medium type 4  Height:   Ht Readings from Last 1 Encounters:  02/14/20 5\' 2"  (1.575 m)    Weight:   Wt Readings from Last 1 Encounters:  03/13/20 72.9 kg    Ideal Body Weight:  50 kg  BMI:  Body mass index is 29.4 kg/m.  Estimated Nutritional Needs:   Kcal:  1700-1900  Protein:  85-100 grams  Fluid:  1.7-1.9 L    Gaynell Face, MS, RD, LDN Inpatient Clinical Dietitian Pager: [phone] Weekend/After Hours: [phone]

## 2020-03-13 NOTE — Progress Notes (Signed)
[place] for Argatroban Indication: HIT,  Bilateral PE  Allergies  Allergen Reactions  . Erythromycin Itching and Other (See Comments)    Severe stomach pains, diarrhea  . Heparin     Likely HIT, SRA negative    Patient Measurements: Weight: 72.9 kg (160 lb 11.5 oz)  Vital Signs: Temp: 98 F (36.7 C) (05/03 1533) Temp Source: Oral (05/03 1533) BP: 127/68 (05/03 1533) Pulse Rate: 83 (05/03 1533)  Labs: Recent Labs    03/11/20 0451 03/11/20 1900 03/12/20 0536 03/12/20 0536 03/12/20 1641 03/13/20 0726 03/13/20 1832  HGB 7.8*  --  8.8*  --   --  7.8*  --   HCT 25.1*  --  28.9*  --   --  25.0*  --   PLT 328  --  449*  --   --  445*  --   APTT 51*   < > 53*   < > 55* 52* 46*  CREATININE 0.87  --  0.79  --   --  0.79  --    < > = values in this interval not displayed.    Estimated Creatinine Clearance: 58.5 mL/min (by C-G formula based on SCr of 0.79 mg/dL).  Assessment: 74 year old initially admitted 4/4 with acute IVH and thrombocythemia with PLTC of 990k. Patient was ultimately diagnosed with polythemia, now s/p leukophoresis, therapeutic phlebotomy, and initiation of hydroxyurea. On 4/13 - found to have B/L PE - decision was risk of AC was too great and IVC placed. CT scan 4/15 showed extensive thrombus within confluence of sinuses and sent to IR with mechanical thrombectomy x5.  SRA resulted negative, Dr. Jana Hakim ordered repeat sent to different lab - still with working diagnosis of HIT d/t high pre-test probability. Also need to look into other causes for new TCP. Platelets are now WNL.  03/13/20: aPTT has been stable last few days but fell this evening slightly sub- therapeutic at 46 sec on agatroban 0.16mcg/kg/min (goal 50-60 seconds). Currently using a dosing weight of 79.7 kg. No acute bleeding noted.  Hgb stable  (ranged in 7s-8s last several days).  Pltc has improved  200s Continuing Argatroban as pradaxa cannot be  crushed.  Goal of Therapy:  aPTT 50 - 60 seconds Monitor platelets by anticoagulation protocol: Yes   Plan: Slightly increase argatroban at 0.7 mcg/kg/min  Continue q12h aPTT checks Monitor daily CBC, s/sx bleeding Follow-up plans switching to [place].D. CPP, BCPS Clinical Pharmacist [phone] 03/13/2020 8:08 PM    If after 10pm, please refer to [facility] for unit-specific pharmacist

## 2020-03-13 NOTE — Progress Notes (Signed)
Social Work Assessment and Plan   Patient Details  Name: [name] MRN: [id number] Date of Birth: 04/05/1946  Today's Date: 03/13/2020  Problem List:  Patient Active Problem List   Diagnosis Date Noted  . Sundowning   . Elevated BUN   . Peripheral edema   . Impulsive   . Hypoalbuminemia due to protein-calorie malnutrition ([place])   . Leucocytosis   . Dysphagia, post-stroke   . Anemia   . Prediabetes   . Steroid-induced hyperglycemia   . Embolic stroke ([place]) 21/30/8657  . Dysphagia   . Vascular headache   . Thrombocytosis ([place])   . Venous thrombosis   . On amiodarone therapy   . Polycythemia   . Encounter for central line care   . Nontraumatic subcortical hemorrhage of right cerebral hemisphere ([place])   . Abnormal CT of the chest   . Atrial flutter with rapid ventricular response ([place])   . Pulmonary embolus ([place]) 02/22/2020  . Polycythemia vera ([place]) 02/17/2020  . Likely Cerebral amyloid angiopathy ([place]) 02/17/2020  . IVH (intraventricular hemorrhage) ([place]) 02/14/2020  . TIA (transient ischemic attack) 08/17/2014  . Acute sinusitis 08/16/2014  . Headache 08/15/2014  . Hypothyroidism 08/15/2014  . Hepatitis C   . Hyperlipidemia   . Hypertension    Past Medical History:  Past Medical History:  Diagnosis Date  . Hepatitis C   . Hyperlipidemia   . Hypertension   . Osteoporosis    Past Surgical History:  Past Surgical History:  Procedure Laterality Date  . IR ANGIO INTRA EXTRACRAN SEL INTERNAL CAROTID BILAT MOD SED  02/24/2020  . IR ANGIO VERTEBRAL SEL VERTEBRAL UNI R MOD SED  02/24/2020  . IR ANGIOGRAM SELECTIVE EACH ADDITIONAL VESSEL  02/24/2020  . IR IVC FILTER PLMT / S&I /IMG GUID/MOD SED  02/22/2020  . IR THROMBECT VENO MECH MOD SED  02/24/2020  . IR US GUIDE VASC ACCESS RIGHT  02/24/2020  . IR US GUIDE VASC ACCESS RIGHT  02/24/2020  . IR VENO SAGITTAL SINUS  02/24/2020  . IR VENO/JUGULAR RIGHT  02/24/2020  . RADIOLOGY WITH ANESTHESIA N/A 02/24/2020   Procedure: IR WITH ANESTHESIA;  Surgeon: Radiologist, Medication, MD;  Location: [facility];  Service: Radiology;  Laterality: N/A;   Social History:  reports that she has quit smoking. She has never used smokeless tobacco. She reports that she does not drink alcohol or use drugs.  Family / Support Systems Patient Roles: Spouse Spouse/Significant Other: [name] Children: 2 Sons (Both in [place]) Other Supports: Sons Anticipated Caregiver: Spouse Ability/Limitations of Caregiver: N/a  Social History Preferred language: English Religion: Non-Denominational Read: Yes Write: Yes Employment Status: Retired   Abuse/Neglect Abuse/Neglect Assessment Can Be Completed: Yes Physical Abuse: Denies Verbal Abuse: Denies Sexual Abuse: Denies Exploitation of patient/patient's resources: Denies Self-Neglect: Denies  Emotional Status Pt's affect, behavior and adjustment status: Patient states she's sure its changed, everything is kind of new to her. Recent Psychosocial Issues: n/a Psychiatric History: n/a Substance Abuse History: n/a  Patient / Family Perceptions, Expectations & Goals Pt/Family understanding of illness & functional limitations: yes Pt/family expectations/goals: Patient goal to discharge back home  [employer]: None Premorbid Home Care/DME Agencies: None Transportation available at discharge: Spouse able to transport  Discharge Planning Living Arrangements: Spouse/significant other Support Systems: Children, Spouse/significant other Type of Residence: Private residence Insurance Resources: Scientist, clinical (histocompatibility and immunogenetics)) Living Expenses: Rent Money Management: Patient, Spouse Does the patient have any problems obtaining your medications?: No Social Work Anticipated Follow Up Needs: HH/OP Expected length  of stay: 20-22 Days  Clinical Impression SW entered patient room. Met patient, introduced self and explained role. SW called patient spouse at bedside. Patient  pleasant, somewhat confused but she was aware of confusion. Sw will continue follow up with questions and concerns.   [name] 03/13/2020, 1:15 PM

## 2020-03-13 NOTE — Progress Notes (Signed)
Speech Language Pathology Daily Session Note  Patient Details  Name: [name] MRN: [id number] Date of Birth: [birth year]  Today's Date: 03/13/2020 SLP Individual Time: 6811-5726 SLP Individual Time Calculation (min): 44 min  Short Term Goals: Week 1: SLP Short Term Goal 1 (Week 1): Patient will tolerate PO trials of thin liquids with no overt s/sx of aspiration. SLP Short Term Goal 2 (Week 1): Patient will consume PO trials of dysphagia 2 textures with adequate mastication and oral clearance with no overt s/sx of aspiration. SLP Short Term Goal 3 (Week 1): Patient will consume current pureed diet and nectar thick liquids with no overt s/sx of aspriation. SLP Short Term Goal 4 (Week 1): Patient will answer conrete yes/no questions with 80% accuracy and max cues. SLP Short Term Goal 5 (Week 1): Patient will follow 2 step directions with 80% accuracy and max cues. SLP Short Term Goal 6 (Week 1): Patient will name simple/fucntional objects with 80% accuracy and max cues.  Skilled Therapeutic Interventions: Pt was seen for skilled ST targeting dysphagia and further diagnostic treatment of pt's cognitive impairments. Pt's husband, [name], was present throughout session and receptive to educational opportunities. SLP facilitated session with trials of ice and thin H2O, provided last MBSS indicated sensed aspiration, which would warrant continue trials at bedside to determine change/readiness for repeat imaging. Pt consumed ~8 ice chips, 6 tsp H2O, and self fed 4 cup sips H2O with 2 immediate throat clear or cough responses noted. Pt also accepted trials of dysphagia 2 solids, during which she demonstrated fully efficient mastication and oral clearance without need for cueing other than for sustained attention to intake and problem solving how to self-feed with interference of NG tube. Would recommend pt upgrade to dysphagia 2 solids (minced/ground), continue nectar thick liquids at this time. Pt will  still require full supervision during intake due to cognition and expressive/receptive aphasia. SLP further facilitated session with administration of portions of the [facility] and other informal measures of pt's cognitive function. Pt required Total A for basic problem solving, and Max A to follow 1-step directions and name items on the [place]. During structured tasks the need for cues to sustain attention and encouragement for participation also significantly increased (Max A). Goals for sustained attention and problem solving initiated for this admission. Answered all questions from pt and her husband to their satisfaction at this time. Pt left laying in bed with alarm set and needs met, husband still present. Continue per current plan of care.        Pain Pain Assessment Pain Scale: 0-10 Pain Score: 0-No pain  Therapy/Group: Individual Therapy  Arbutus Leas 03/13/2020, 7:22 AM

## 2020-03-13 NOTE — IPOC Note (Signed)
Overall Plan of Care [facility]) Patient Details Name: [name] MRN: [id number] DOB: Sep 18, 1946  Admitting Diagnosis: Nontraumatic subcortical hemorrhage of right cerebral hemisphere [facility])  Hospital Problems: Principal Problem:   Nontraumatic subcortical hemorrhage of right cerebral hemisphere [facility]) Active Problems:   Embolic stroke ([place])   Hypoalbuminemia due to protein-calorie malnutrition ([place])   Leucocytosis   Dysphagia, post-stroke   Anemia   Prediabetes   Steroid-induced hyperglycemia   Sundowning   Elevated BUN   Peripheral edema   Impulsive     Functional Problem List: Nursing    PT Balance, Edema, Endurance, Motor, Nutrition, Perception, Safety, Behavior  OT Balance, Cognition, Edema, Endurance, Motor, Pain, Perception, Safety, Sensory, Vision  SLP    TR         Basic ADL's: OT Grooming, Bathing, Dressing, Toileting     Advanced  ADL's: OT       Transfers: PT Bed Mobility, Bed to Chair, Car, Furniture, Floor  OT Toilet, Tub/Shower     Locomotion: PT Stairs, Emergency planning/management officer, Ambulation     Additional Impairments: OT    SLP        TR      Anticipated Outcomes Item Anticipated Outcome  Self Feeding S  Swallowing  Mod I   Basic self-care  S  Toileting  S   Bathroom Transfers S  Bowel/Bladder     Transfers  supervision  Locomotion  supervision short distance gait w/ LRAD  Communication  Min A  Cognition     Pain     Safety/Judgment      Therapy Plan: PT Intensity: Minimum of 1-2 x/day ,45 to 90 minutes PT Frequency: 5 out of 7 days PT Duration Estimated Length of Stay: 2-3 weeks OT Intensity: Minimum of 1-2 x/day, 45 to 90 minutes OT Frequency: 5 out of 7 days OT Duration/Estimated Length of Stay: 2-3 weeks SLP Intensity: Minumum of 1-2 x/day, 30 to 90 minutes SLP Frequency: 3 to 5 out of 7 days SLP Duration/Estimated Length of Stay: 2-3 weeks   Due to the current state of emergency, patients may not be receiving  their 3-hours of Medicare-mandated therapy.   Team Interventions: Nursing Interventions    PT interventions Ambulation/gait training, Community reintegration, Neuromuscular re-education, DME/adaptive equipment instruction, Psychosocial support, Stair training, UE/LE Strength taining/ROM, Wheelchair propulsion/positioning, UE/LE Coordination activities, Therapeutic Activities, Skin care/wound management, Pain management, Functional electrical stimulation, Training and development officer, Discharge planning, Disease management/prevention, Cognitive remediation/compensation, Functional mobility training, Patient/family education, Splinting/orthotics, Therapeutic Exercise, Visual/perceptual remediation/compensation  OT Interventions Balance/vestibular training, Discharge planning, Pain management, Self Care/advanced ADL retraining, Therapeutic Activities, UE/LE Coordination activities, Visual/perceptual remediation/compensation, Therapeutic Exercise, Skin care/wound managment, Functional mobility training, Patient/family education, Disease mangement/prevention, Cognitive remediation/compensation, Academic librarian, Engineer, drilling, Neuromuscular re-education, Psychosocial support, UE/LE Strength taining/ROM, Wheelchair propulsion/positioning  SLP Interventions Cueing hierarchy, Multimodal communication approach, Speech/Language facilitation, Internal/external aids, Therapeutic Exercise, Patient/family education, Dysphagia/aspiration precaution training  TR Interventions    SW/CM Interventions Discharge Planning, Psychosocial Support, Patient/Family Education   Barriers to Discharge MD  Medical stability, Home enviroment access/loayout and Nutritional means  Nursing      PT Medical stability, Nutrition means, Behavior, Inaccessible home environment    OT Inaccessible home environment    SLP      SW       Team Discharge Planning: Destination: PT-Home ,OT- Home ,  SLP-Home Projected Follow-up: PT-Home health PT, OT-  Home health OT, SLP-Home Health SLP Projected Equipment Needs: PT-To be determined, OT- To be determined, SLP-  Equipment Details:  PT- , OT-  Patient/family involved in discharge planning: PT- Patient, Family member/caregiver,  OT-Patient, SLP-Patient  MD ELOS: 21-25d Medical Rehab Prognosis:  Good Assessment:  74 year old female with history of HTN, Hep C who was admitted on 02/14/20 with onset of severe persistent HA and N/V.  History taken from chart review due to cognition. CT head done revealing acute IVH in right lateral ventricle and severe chronic ischemic microangiopathy.  CBC revealed thrombocytosis with platelets 990, Hgb 17.7 and WBC 12.0.  Neurology question of bleed was HTN versus CVA related.  Dr. Jana Hakim was consulted for input and recommended bone biopsy as well as leukopheresis for work-up of panmyelosis felt to be due to polycytopenia vera.  2D echo showing EF 65-70%.  She was placed on low-dose Cleviprex and was started on pheresis with improvement in cell counts.  Bone biopsy was positive for JAK2 confirming polycythemia vera. She did not meet criteria for von Willebrand disease with borderline low ristocetin cofacto and to be repeated once patient in remission.  She developed lethargy on 04/05 with repeat CT showing IVH without change.  MRI/MRA brain ordered for work-up and showed stable bleed with progression of extensive confluent hyperintensity throughout the brain question due to microangiopathy versus amyloid angiopathy.  She completed leukapheresis x3 with plans for DCCV  but had worsening of headache on 4/10.  As hemoglobin was up to 17.3, she underwent phlebotomy of 1 units and CTA head done showing patent vessels but suboptimal evaluation due to artifact.  EEG done for work-up and showed moderate diffuse encephalopathy and was negative for seizures.  Depakote was added with some improvement in headaches.  She did develop  SOB with intermittent tachycardia and CTA chest done showing acute BLL emboli and emphysematous change in BUL.  BLE Dopplers negative for DVT.  IVC filter was placed by Dr. Anselm Pancoast on 04/13 due to [place].  She did develop A. fib with RVR with short runs of NSVT on 04/14.  Cardiology was consulted for input and recommended IV metoprolol as well as question of cardioversion due to hypotension.  She was transferred to ICU for monitoring and IV amiodarone added to controlled recurrent Atrial flutter.  She did develop increasing headaches with drop in platelets less than 2400.  Work-up was positive for HIT and and IV Solu-Medrol was added.  CT head was repeated on 04/14 showing hyperdensity and straight sinus and follow-up CT venogram revealed extensive thrombus within confluence of sinuses and straight sinus extending into the vein of [place] and fairly extensive multifocal thrombus within right greater than left transverse and sigmoid dural venous sinuses.  She underwent emergent thrombectomy with recanalization of deep system and decrease in clot burden. Follow-up MRI brain on 04/16 showed numerous tiny acute infarcts in bilateral cerebral and cerebellar hemispheres felt to be embolic, 2.3 cm acute cortical infarct right temporal and occipital lobes, new region of parenchymal swelling left frontal lobe and frontal operculum and unchanged subacute IVH without hydrocephalus.    She was started on hypertonic saline for edema control and case was discussed with Dr. Jana Hakim.  He recommended lower therapeutic range of argatroban for treatment.  She was also started on IV steroids, IV Solu-Medrol 500 mg x 3 days and has been tapered to Decadron.  Cognition has fluctuated between lethargy and bouts of agitation.   She did develop drug-induced thrombocytopenia felt to be related to amiodarone which was discontinued as heart rate was stable.  CT abdomen pelvis was ordered due to ongoing  issues with restlessness and showed  generalized body wall edema with mild retroperitoneal edema but no other acute abnormality. Hyperglycemia due to steroids being managed with sliding scale insulin.  She continues on tube feeds for nutritional support MBS performed 4/29 and testing was limited as patient refused all p.o.'s and oropharyngeal dysphagia appeared to be cognitive in nature and aspiration of thins noted. She was started on dysphagia 1. nectar liquids Dr. Jana Hakim recommends argatroban until able to tolerate p.o.'s at which time she could be switched to [place]. Sedating medications which were discontinued and amantadine added to help with activation. Depakote resumed 4/26 to help manage HA is slowly being added back to help manage headaches and agitation. Please see preadmission assessment from today as well.    Now requiring 24/7 Rehab RN,MD, as well as CIR level PT, OT and SLP.  Treatment team will focus on ADLs and mobility with goals set at [place] See Team Conference Notes for weekly updates to the plan of care

## 2020-03-13 NOTE — Progress Notes (Signed)
[name], MD  Physician  Physical Medicine and Rehabilitation  PMR Pre-admission     Addendum  Date of Service:  03/09/2020 12:37 PM      Related encounter: ED to Hosp-Admission (Discharged) from 02/13/2020 in [place]        Show:Clear all [x] Manual[x] Template[x] Copied  Added by: [x] Cristina Gong, RN  [] Hover for details PMR Admission Coordinator Pre-Admission Assessment   Patient: [name] is an 74 y.o., female MRN: [id number] DOB: 1946/10/03 Height: 5\' 2"  (157.5 cm) Weight: 75.6 kg                                                                                                                                                  Insurance Information HMO:     PPO:     PCP:      IPA:      80/20:      OTHER:  PRIMARY: [employer] Medicare      Policy#: [id number]      Subscriber: pt CM Name: [name]     Phone#: [phone]     Fax#: [phone] Pre-Cert#: [id number]   Approved for 7 days when updates are due to tbd Employer:  Benefits:  Phone #: [phone]     Name: 4/29 Eff. Date: 11/12/2019     Deduct: none      Out of Pocket Max: $5000      Life Max: none  CIR: $295 co pay per day days 1 until 6      SNF: no copay days 1 until 20; $184 per day days 21 until 100 Outpatient: $35 per visit     Co-Pay: visits per medical neccesity Home Health: 100%      Co-Pay: visits per medical neccesity DME: 80%     Co-Pay: 20% Providers: in network  SECONDARY: none      Policy#:       Phone#:    Development worker, community:       Phone#:    The Engineer, [employer]" for patients in Inpatient Rehabilitation Facilities with attached "Privacy Act [place] Records" was provided and verbally reviewed with: Family   Emergency Contact Information         Contact Information     Name Relation Home Work Mobile    [name], [place] [phone]   [phone]       Current Medical History  Patient Admitting Diagnosis: IVH     History of Present Illness:  74 y.o. female with history of HTN who was admitted on 02/14/20 with severe persistent HA and N/V. CT head revealed acute IVH in right lateral ventricle with severe chronic ischemic microangiopathy. CBC revealed thrombocytosis with plt- 990, hgb 17.7 and WBC 12.0. Neurology questioned if bleed HTN v/s CAA related. Dr. Jana Hakim was consulted for input and recommended bone biopsy  as well as leukopheresis for work up of panmyelosis felt to be due to polycythemia vera.  2D echo done revealing EF 65-70% with no wall abnormality. She was placed on low dose cleviprex and was started on pheresis with improvement in cell counts. Bone biopsy positive for JAK2 confirming polycythemia vera-- did not meet criteria for von Willebrand disease with borderline low ristocetin cofactor -->to be repeated once patient in remission. She developed lethargy 4/5 with repeat CT head showed IVH without change. MRI/MRA brain ordered for work up and showed stable bleed and progression of extensive confluent hyperintensity thorough out the brain question due to microangiopathy v/s amyloid angiopathy. She completed leukopheresis X 3 with plans for d/c but had worsening of HA on 4/10. As hgb up to 17.3, she underwent phlebotomy of one unit and CTA head done showing patent vessels but suboptimal evaluation due to artifact. EEG done which showed moderate diffuse encephalopathy and was negative for seizures .   Depakote was added with some improvement in HA. She developed SOB with intermittent tachycardia and CTA chest done showing acute BLL emboli and emphysematous changes seen in BUL. BLE dopplers were negative for DVT. IVC filter placed by Dr. Anselm Pancoast on 4/13 due to [place]. She developed A fib with RVR with short runs of NSVT on 04/14. Cardiology consulted for input and recommended IV metoprolol with question of cardioversion due to hypotension. She was transferred to ICU for monitoring and required IV amiodarone for  rate recurrent flutter.  She developed increased in HA with drop in platelets to <24,00 and IV solumedrol added. Work up positive for HIT.  CT head repeated 4/15 showing hypodensity in straight sinus and follow up CT venogram done revealing extensive thrombus within confluence of sinuses and straight sinus extending into vein of Galen and fairly extensive multifocal thrombus within R>L transverse and sigmoid dural venous sinuses.  She underwent emergent thrombectomy with recanalization of deep system with decrease in clot burden. Follow up MRI brain 4/16 showed numerous tiny acute infarcts in bilateral cerebral and cerebellar hemispheres felt to be embolic, 2.3 cm acute cortical infarct right temporal occipital lobes likely venous infarct, new region of parenchymal swelling left frontal lobe and frontal operculum and unchanged subacute IVH without hydrocephalus.  She was started on hypertonic saline for edema control and case discussed with Hem/Onc who recommended lower therapeutic range of Argotroban.  Mentation continues to fluctuate with bouts of agitation and lethargy. On tube feeds for nutritional supplement. Once she is taking pos normally, hemoc will d/c argatroban and start DOACS.    Neurology follow up on 4/29 and patient more alert and interactive after starting amantadine and removing some of her medica cations. Recommend taper her dexamethasone further.    Complete NIHSS TOTAL: 7 Glasgow Coma Scale Score: 14   Past Medical History      Past Medical History:  Diagnosis Date  . Hepatitis C    . Hyperlipidemia    . Hypertension    . Osteoporosis        Family History  family history includes Heart disease in her father and mother.   Prior Rehab/Hospitalizations:  Has the patient had prior rehab or hospitalizations prior to admission? Yes   Has the patient had major surgery during 100 days prior to admission? No   Current Medications    Current Facility-Administered Medications:   .   stroke: mapping our early stages of recovery book, , Does not apply, Once, Biby, Sharon L, NP .  0.9 %  sodium chloride infusion, , Intravenous, PRN, Burnetta Sabin L, NP, Last Rate: 8 mL/hr at 03/06/20 1100, Rate Change at 03/06/20 1100 .  acetaminophen (TYLENOL) tablet 650 mg, 650 mg, Oral, Q6H PRN, 325 mg at 02/24/20 2254 **OR** acetaminophen (TYLENOL) 160 MG/5ML solution 650 mg, 650 mg, Per Tube, Q6H PRN, 650 mg at 03/09/20 2040 **OR** acetaminophen (TYLENOL) suppository 650 mg, 650 mg, Rectal, Q6H PRN, Biby, Sharon L, NP .  amantadine (SYMMETREL) 50 MG/5ML solution 100 mg, 100 mg, Per Tube, BID, Biby, Sharon L, NP, 100 mg at 03/10/20 [id number] .  amLODipine (NORVASC) tablet 10 mg, 10 mg, Per Tube, Daily, Biby, Sharon L, NP, 10 mg at 03/09/20 1006 .  argatroban 1 mg/mL infusion, 0.65 mcg/kg/min, Intravenous, Continuous, Garvin Fila, MD, Last Rate: 3.11 mL/hr at 03/08/20 1925, 0.65 mcg/kg/min at 03/08/20 1925 .  atorvastatin (LIPITOR) tablet 10 mg, 10 mg, Per Tube, Daily, Biby, Sharon L, NP, 10 mg at 03/10/20 [id number] .  chlorhexidine (PERIDEX) 0.12 % solution 15 mL, 15 mL, Mouth Rinse, BID, Biby, Sharon L, NP, 15 mL at 03/10/20 0924 .  Chlorhexidine Gluconate Cloth 2 % PADS 6 each, 6 each, Topical, Daily, Donzetta Starch, NP, 6 each at 03/10/20 [phone] .  [COMPLETED] dexamethasone (DECADRON) injection 10 mg, 10 mg, Intravenous, Once, 10 mg at 02/27/20 0925 **FOLLOWED BY** dexamethasone (DECADRON) injection 4 mg, 4 mg, Intravenous, Q12H, Biby, Sharon L, NP, 4 mg at 03/10/20 0921 .  feeding supplement (GLUCERNA 1.2 CAL) liquid 1,000 mL, 1,000 mL, Per Tube, Continuous, Biby, Sharon L, NP, Last Rate: 60 mL/hr at 03/08/20 0631, 1,000 mL at 03/08/20 0631 .  feeding supplement (PRO-STAT SUGAR FREE 64) liquid 30 mL, 30 mL, Per Tube, Daily, Biby, Sharon L, NP, 30 mL at 03/10/20 0921 .  fluticasone furoate-vilanterol (BREO ELLIPTA) 200-25 MCG/INH 1 puff, 1 puff, Inhalation, Daily, Biby, Sharon L, NP, 1 puff at  03/10/20 0819 .  Gerhardt's butt cream, , Topical, PRN, Greta Doom, MD .  hydrALAZINE (APRESOLINE) tablet 100 mg, 100 mg, Per Tube, TID, Donzetta Starch, NP, 100 mg at 03/10/20 1602 .  insulin aspart (novoLOG) injection 0-20 Units, 0-20 Units, Subcutaneous, Q4H, Biby, Sharon L, NP, 4 Units at 03/10/20 1601 .  insulin aspart (novoLOG) injection 5 Units, 5 Units, Subcutaneous, Q4H, Biby, Sharon L, NP, 5 Units at 03/10/20 1601 .  labetalol (NORMODYNE) injection 10-20 mg, 10-20 mg, Intravenous, Q2H PRN, Biby, Sharon L, NP .  levothyroxine (SYNTHROID) tablet 125 mcg, 125 mcg, Per Tube, QAC breakfast, Donzetta Starch, NP, 125 mcg at 03/10/20 0534 .  losartan (COZAAR) tablet 50 mg, 50 mg, Per Tube, BID, Biby, Sharon L, NP, 50 mg at 03/09/20 2130 .  MEDLINE mouth rinse, 15 mL, Mouth Rinse, q12n4p, Biby, Sharon L, NP, 15 mL at 03/10/20 1602 .  ondansetron (ZOFRAN) injection 4 mg, 4 mg, Intravenous, Q4H PRN, Biby, Sharon L, NP, 4 mg at 02/28/20 1617 .  potassium chloride 20 MEQ/15ML (10%) solution 40 mEq, 40 mEq, Per Tube, Daily, Biby, Sharon L, NP, 40 mEq at 03/10/20 [id number] .  QUEtiapine (SEROQUEL) tablet 25 mg, 25 mg, Oral, QHS PRN, Greta Doom, MD, 25 mg at 03/09/20 2042 .  Resource [employer], , Oral, PRN, Garvin Fila, MD .  senna-docusate (Senokot-S) tablet 1 tablet, 1 tablet, Per Tube, BID, Donzetta Starch, NP, 1 tablet at 03/09/20 2130 .  sodium chloride flush (NS) 0.9 % injection 10-40 mL, 10-40 mL, Intracatheter, Q12H, Biby, Sharon L, NP, 10 mL  at 03/10/20 0924 .  sodium chloride flush (NS) 0.9 % injection 10-40 mL, 10-40 mL, Intracatheter, PRN, Biby, Sharon L, NP .  valproic acid (DEPAKENE) 250 MG/5ML solution 250 mg, 250 mg, Per Tube, BID, Biby, Sharon L, NP, 250 mg at 03/10/20 [id number]   Patients Current Diet:     Diet Order                      DIET - DYS 1 Room service appropriate? Yes; Fluid consistency: Nectar Thick  Diet effective now                    Precautions / Restrictions Precautions Precautions: Fall Precaution Comments: expressive/receptive difficulties Leans R Restrictions Weight Bearing Restrictions: No    Has the patient had 2 or more falls or a fall with injury in the past year?No   Prior Activity Level Community (5-7x/wk): Independent, active and driving   Prior Functional Level Prior Function Level of Independence: Independent Comments: pt drives, does everything for self PTA   Self Care: Did the patient need help bathing, dressing, using the toilet or eating?  Independent   Indoor Mobility: Did the patient need assistance with walking from room to room (with or without device)? Independent   Stairs: Did the patient need assistance with internal or external stairs (with or without device)? Independent   Functional Cognition: Did the patient need help planning regular tasks such as shopping or remembering to take medications? Independent   Home Assistive Devices / Equipment Home Assistive Devices/Equipment: Eyeglasses Home Equipment: None   Prior Device Use: Indicate devices/aids used by the patient prior to current illness, exacerbation or injury? None of the above   Current Functional Level Cognition   Overall Cognitive Status: Impaired/Different from baseline Difficult to assess due to: Impaired communication Current Attention Level: Sustained(short periods of sustained attention with structured environ) Orientation Level: Disoriented X4 Following Commands: Follows one step commands with increased time, Follows one step commands inconsistently Safety/Judgement: Decreased awareness of safety, Decreased awareness of deficits General Comments: Pt remains confused, unaware of her current situation, safety, picking at lines with mittens off.  Was able to preform peri care with sustained attention today.  Attention: Sustained Sustained Attention: Impaired Sustained Attention Impairment: Verbal  complex Memory: Impaired Memory Impairment: Retrieval deficit(pt benefited from category cue for 3/5 items, multiple choice for 1/5 items and recalled one item without cue)    Extremity Assessment (includes Sensation/Coordination)   Upper Extremity Assessment: Difficult to assess due to impaired cognition LUE Deficits / Details: weakness noted but using functionally with decrease grasp LUE Coordination: decreased gross motor, decreased fine motor  Lower Extremity Assessment: Defer to PT evaluation LLE Deficits / Details: weakness     ADLs   Overall ADL's : Needs assistance/impaired Eating/Feeding: Set up, Sitting Grooming: Total assistance, Brushing hair Grooming Details (indicate cue type and reason): limited d/t ideational apraxia Lower Body Bathing: Total assistance, Bed level Upper Body Dressing : Moderate assistance, Sitting Upper Body Dressing Details (indicate cue type and reason): to don gown as back side cover Lower Body Dressing: Total assistance, Bed level Lower Body Dressing Details (indicate cue type and reason): to don socks Toilet Transfer: Moderate assistance, +2 for physical assistance, Ambulation, RW Toilet Transfer Details (indicate cue type and reason): simulated via functional mobility; R lateral lean during ambulation with RW Toileting- Clothing Manipulation and Hygiene: Min guard Toileting - Clothing Manipulation Details (indicate cue type and reason): Safety Functional mobility during ADLs:  Moderate assistance, +2 for physical assistance, +2 for safety/equipment, Rolling walker General ADL Comments: pt able to complete functional mobility this session with RW and MOD A +2 d/t R lateral lean. Pt continues to present with cognitive impairments, generalized weakness and decreased strength impacting ability to participate in ADLs     Mobility   Overal bed mobility: Needs Assistance Bed Mobility: Supine to Sit Rolling: Total assist, +2 for physical  assistance Sidelying to sit: Min assist, HOB elevated(husband providing hand held assist) Supine to sit: Min assist, HOB elevated Sit to supine: Min assist, HOB elevated General bed mobility comments: Min hand held assist to come to sitting EOB, min assist of one leg to return to bed.       Transfers   Overall transfer level: Needs assistance Equipment used: Rolling walker (2 wheeled) Transfer via Lift Equipment: Stedy Transfers: Sit to/from Stand, Risk manager Sit to Stand: Min assist, Mod assist Stand pivot transfers: Min assist, Mod assist General transfer comment: Min assist for first attempt to get to [facility], mod assist for back to bed once done on [facility], stood extra time for pericare. Pt also reporting low back hurts after being on BSC.      Ambulation / Gait / Stairs / Wheelchair Mobility   Ambulation/Gait Ambulation/Gait assistance: Mod assist, +2 physical assistance(third person following with chair. ) Gait Distance (Feet): 30 Feet Assistive device: Rolling walker (2 wheeled) Gait Pattern/deviations: Step-through pattern, Drifts right/left, Staggering right General Gait Details: Did not attempt with only one assist today as her husband and son were not there.   Gait velocity: decreased Gait velocity interpretation: <1.8 ft/sec, indicate of risk for recurrent falls     Posture / Balance Dynamic Sitting Balance Sitting balance - Comments: close supervision EOB.  Balance Overall balance assessment: Needs assistance Sitting-balance support: Feet supported, Bilateral upper extremity supported Sitting balance-Leahy Scale: Fair Sitting balance - Comments: close supervision EOB.  Postural control: Right lateral lean Standing balance support: Bilateral upper extremity supported Standing balance-Leahy Scale: Poor Standing balance comment: needs external support from RW and therapist in standing.    Secial needs/care consideration   Cortrak 43 inches 10 Fr left nare  02/28/2020 Designated visitors are spouse, [name] and son,  [name]. I explained that second son , Gaspar Bidding not allowed to be third visitor Argatroban IV infusion Left basilic PICC 0000000 Right neck incsion    Previous Home Environment  Living Arrangements: Spouse/significant other  Lives With: Spouse Available Help at Discharge: Family, Available 24 hours/day Type of Home: House Home Layout: Two level, Able to live on main level with bedroom/bathroom Home Access: Stairs to enter Entrance Stairs-Rails: None Entrance Stairs-Number of Steps: 2 Bathroom Shower/Tub: Chiropodist: Goldsmith: No   Discharge Living Setting Plans for Discharge Living Setting: Patient's home, Lives with (comment)(spouse) Type of Home at Discharge: House Discharge Home Layout: Two level, Able to live on main level with bedroom/bathroom Alternate Level Stairs-Rails: None Discharge Home Access: Stairs to enter Entrance Stairs-Rails: None Entrance Stairs-Number of Steps: 2 Discharge Bathroom Shower/Tub: Tub/shower unit Discharge Bathroom Toilet: Standard Discharge Bathroom Accessibility: Yes How Accessible: Accessible via walker Does the patient have any problems obtaining your medications?: No   Social/Family/Support Systems Patient Roles: Spouse, Parent Contact Information: spouse, [name] Anticipated Caregiver: spouse Anticipated Ambulance person Information: see above Ability/Limitations of Caregiver: none Caregiver Availability: 24/7 Discharge Plan Discussed with Primary Caregiver: Yes Is Caregiver In Agreement with Plan?: Yes Does Caregiver/Family have Issues with Lodging/Transportation while  Pt is in Rehab?: No   Goals Patient/Family Goal for Rehab: supervision to min assist assist with PT, OT, and SLP Expected length of stay: ELOS 20 to 24 days Pt/Family Agrees to Admission and willing to participate: Yes Program Orientation Provided & Reviewed with Pt/Caregiver  Including Roles  & Responsibilities: Yes   Decrease burden of Care through IP rehab admission:n/a   Possible need for SNF placement upon discharge:not anticipated   Patient Condition: This patient's medical and functional status has changed since the consult dated: 02/28/2020 in which the Rehabilitation Physician determined and documented that the patient's condition is appropriate for intensive rehabilitative care in an inpatient rehabilitation facility. See "History of Present Illness" (above) for medical update. Functional changes are: overall mod assist. Patient's medical and functional status update has been discussed with the Rehabilitation physician and patient remains appropriate for inpatient rehabilitation. Will admit to inpatient rehab today.   Preadmission Screen Completed By:  Cleatrice Burke, RN, 03/10/2020 4:59 PM ______________________________________________________________________   Discussed status with Dr. Posey Pronto on 03/10/2020 at  48 and received approval for admission today.   Admission Coordinator:  Cleatrice Burke, time [id number] Date 03/10/2020         Revision History

## 2020-03-13 NOTE — Progress Notes (Signed)
Occupational Therapy Session Note  Patient Details  Name: [name] MRN: [id number] Date of Birth: Apr 04, 1946  Today's Date: 03/13/2020 OT Individual Time: [id number] OT Individual Time Calculation (min): 26 min    Short Term Goals: Week 1:  OT Short Term Goal 1 (Week 1): Pt will don shirt wiht S OT Short Term Goal 2 (Week 1): Pt will complete oral care wiht setup OT Short Term Goal 3 (Week 1): Pt will thread BLE into pants OT Short Term Goal 4 (Week 1): Pt will complete 2/3 components of toileting with MIN A for standing balance  Skilled Therapeutic Interventions/Progress Updates:    Upon entering the room, pt very lethargic and sleeping soundly in bed. Multiple attempts to wake pt who only opens eyes once during this time and quickly goes back to sleep. RN present with medications and reports likely due to dose of seroquel last night. Pt's husband , [name], entering the room with questions reguarding scheduling and discussion about timed toileting schedule. OT answering all questions as able. Pt remained in bed and repositioned with +2 for safety with feeding tube. Bed alarm activated. Call bell and all needed items within reach.   Therapy Documentation Precautions:  Precautions Precautions: Fall Precaution Comments: cortrak Restrictions Weight Bearing Restrictions: No ADL: ADL Grooming: Minimal cueing Where Assessed-Grooming: Sitting at sink Upper Body Bathing: Moderate cueing Where Assessed-Upper Body Bathing: Sitting at sink Lower Body Bathing: Maximal assistance Where Assessed-Lower Body Bathing: Sitting at sink, Standing at sink Upper Body Dressing: Moderate assistance(for threadind d/t PICC and cortrak) Where Assessed-Upper Body Dressing: Sitting at sink Lower Body Dressing: Maximal assistance Where Assessed-Lower Body Dressing: Standing at sink, Sitting at sink Toileting: Maximal assistance Where Assessed-Toileting: Bedside Commode Toilet Transfer: Moderate  assistance Toilet Transfer Method: Stand pivot Toilet Transfer Equipment: Bedside commode   Therapy/Group: Individual Therapy  [name] 03/13/2020, 8:30 AM

## 2020-03-13 NOTE — Plan of Care (Signed)
  Problem: RH Swallowing Goal: LTG Pt will demonstrate functional change in swallow as evidenced by bedside/clinical objective assessment (SLP) Description: LTG: Patient will demonstrate functional change in swallow as evidenced by bedside/clinical objective assessment (SLP) Outcome: Not Applicable   Problem: RH Comprehension Communication Goal: LTG Patient will comprehend basic/complex auditory (SLP) Description: LTG: Patient will comprehend basic/complex auditory information with cues (SLP). Outcome: Not Applicable   Problem: RH Expression Communication Goal: LTG Patient will increase word finding of common (SLP) Description: LTG:  Patient will increase word finding of common objects/daily info/abstract thoughts with cues using compensatory strategies (SLP). Outcome: Not Applicable   Long term goals were entered under incorrect care plan on day of evaluation, therefore these were discontinues and new goals for expressive/receptive language, swallowing, and cognition set under appropriate team care plan.

## 2020-03-13 NOTE — Progress Notes (Signed)
Physical Therapy Session Note  Patient Details  Name: [name] MRN: [id number] Date of Birth: 06/25/1946  Today's Date: 03/13/2020 PT Individual Time: 1045-1130 PT Individual Time Calculation (min): 45 min   Short Term Goals: Week 1:  PT Short Term Goal 1 (Week 1): Pt will ambulate 25' w/ min assist w/ LRAD PT Short Term Goal 2 (Week 1): Pt will initiate stair training PT Short Term Goal 3 (Week 1): Pt will initiate functional tasks w/ min cues 100% of the time PT Short Term Goal 4 (Week 1): Pt will maintain dynamic standing balance w/ min assist  Skilled Therapeutic Interventions/Progress Updates: Pt presented in bed awake and agreeable to therapy. Pt denies pain during start of session. Performed supine to sit at EOB with CGA and increased time with use of bed features. PTA donned socks and pants total A for time management. Performed STS minA with RW to allow PTA to pull up pants over hips and tie pants. Pt then performed stand pivot with RW and minA to w/c. Required cues for sequencing and safe use of RW. Pt transported to day room and performed ambulatory transfer to mat. Performed STS x 5 for BLE strengthening with pt requiring increased time after STS due to fatigue. Performed LAQ and ankle pumps 2 x 10 bilaterally for generalized srtengthening. Performed toe taps to 4in step 2 x 5 bilaterally for standing tolerance and balance. Pt ambulated x 30ft with minA with noted slight ataxia in LLE and mild sway to R (+2 for w/c follow and equip management). Pt transferred back to room and performed ambulatory transfer to bed with CGA and RW. Pt was able to perform sit to supine with CGA and increased time. Pt repositioned to comfort and left with bed alarm on, call bell within reach and needs met.      Therapy Documentation Precautions:  Precautions Precautions: Fall Precaution Comments: cortrak Restrictions Weight Bearing Restrictions: No    Therapy/Group: Individual  Therapy      , PTA  03/13/2020, 4:03 PM  

## 2020-03-13 NOTE — Progress Notes (Signed)
Inpatient Rehabilitation  Patient information reviewed and entered into eRehab system by Mykelle Cockerell M. Thalya Fouche, M.A., CCC/SLP, PPS Coordinator.  Information including medical coding, functional ability and quality indicators will be reviewed and updated through discharge.    

## 2020-03-13 NOTE — Progress Notes (Signed)
[place] for Argatroban Indication: HIT,  Bilateral PE  Allergies  Allergen Reactions  . Erythromycin Itching and Other (See Comments)    Severe stomach pains, diarrhea  . Heparin     Likely HIT, SRA negative    Patient Measurements: Weight: 72.9 kg (160 lb 11.5 oz)  Vital Signs: Temp: 98.1 F (36.7 C) (05/03 0343) BP: 126/61 (05/03 0343) Pulse Rate: 78 (05/03 0343)  Labs: Recent Labs    03/11/20 0451 03/11/20 1900 03/12/20 0536 03/12/20 1641 03/13/20 0726  HGB 7.8*  --  8.8*  --  7.8*  HCT 25.1*  --  28.9*  --  25.0*  PLT 328  --  449*  --  445*  APTT 51*   < > 53* 55* 52*  CREATININE 0.87  --  0.79  --  0.79   < > = values in this interval not displayed.    Estimated Creatinine Clearance: 58.5 mL/min (by C-G formula based on SCr of 0.79 mg/dL).  Assessment: 74 year old initially admitted 4/4 with acute IVH and thrombocythemia with PLTC of 990k. Patient was ultimately diagnosed with polythemia, now s/p leukophoresis, therapeutic phlebotomy, and initiation of hydroxyurea. On 4/13 - found to have B/L PE - decision was risk of AC was too great and IVC placed. CT scan 4/15 showed extensive thrombus within confluence of sinuses and sent to IR with mechanical thrombectomy x5.  SRA resulted negative, Dr. Jana Hakim ordered repeat sent to different lab - still with working diagnosis of HIT d/t high pre-test probability. Also need to look into other causes for new TCP. Platelets are now WNL.  03/13/20: aPTT remains therapeutic at 55 sec on agatroban 0.57mcg/kg/min (goal 50-60 seconds). Currently using a dosing weight of 79.7 kg. No acute bleeding noted.  Hgb stable at 7.8  (ranged in 7s-8s last several days).  Pltc has improved  328k- 445k.  Continuing Argatroban as pradaxa cannot be crushed.  Goal of Therapy:  aPTT 50 - 60 seconds Monitor platelets by anticoagulation protocol: Yes   Plan: Continue argatroban at 0.65 mcg/kg/min   Continue q12h aPTT checks Monitor daily CBC, s/sx bleeding Follow-up plans switching to [place], RPh Clinical Pharmacist Clinical phone for 03/12/20 until 3:30 PM 3:30 PM- 10 PM: [phone] If after 10pm, please refer to [facility] for unit-specific pharmacist

## 2020-03-13 NOTE — Plan of Care (Signed)
  Problem: RH Swallowing Goal: LTG Pt will demonstrate functional change in swallow as evidenced by bedside/clinical objective assessment (SLP) Description: LTG: Patient will demonstrate functional change in swallow as evidenced by bedside/clinical objective assessment (SLP) Outcome: Not Applicable   Problem: RH Comprehension Communication Goal: LTG Patient will comprehend basic/complex auditory (SLP) Description: LTG: Patient will comprehend basic/complex auditory information with cues (SLP). Outcome: Not Applicable   Problem: RH Expression Communication Goal: LTG Patient will increase word finding of common (SLP) Description: LTG:  Patient will increase word finding of common objects/daily info/abstract thoughts with cues using compensatory strategies (SLP). Outcome: Not Applicable   The above listed LTGs were initiated today under correct care plan to address current swallowing, language, and cognitive impairments

## 2020-03-13 NOTE — Progress Notes (Signed)
Victoria Ribas, MD  Physician  Physical Medicine and Rehabilitation  Consult Note     Signed  Date of Service:  02/28/2020  3:09 PM      Related encounter: ED to Hosp-Admission (Discharged) from 02/13/2020 in [place] [facility]   Show:Clear all [x] Manual[x] Template[] Copied  Added by: [x] Love, Ivan Anchors, PA-C[x] Raulkar, Clide Deutscher, MD  [] Hover for details          Physical Medicine and Rehabilitation Consult     Reason for Consult: Functional decline due to ICH/bilateral strokes.  Referring Physician: Dr. Erlinda Hong.      HPI: [name] is a 74 y.o. female with history of HTN who was admitted on 02/14/20 with severe persistent HA and N/V. CT head revealed acute IVH in right lateral ventricle with severe chronic ischemic microangiopathy. CBC revealed thrombocytosis with plt- 990, hgb 17.7 and WBC 12.0. Neurology questioned if bleed HTN v/s CAA related. Dr. Jana Hakim was consulted for input and recommended bone biopsy as well as leukopheresis for work up of panmyelosis felt to be due to polycythemia vera.  2D echo done revealing EF 65-70% with no wall abnormality. She was placed on low dose cleviprex and was started on pheresis with improvement in cell counts. Bone biopsy positive for JAK2 confirming polycythemia vera-- did not meet criteria for von Willebrand disease with borderline low ristocetin cofactor -->to be repeated once patient in remission. She developed lethargy 4/5 with repeat CT head showed IVH without change. MRI/MRA brain ordered for work up and showed stable bleed and progression of extensive confluent hyperintensity thorough out the brain question due to microangiopathy v/s amyloid angiopathy. She completed leukopheresis X 3 with plans for d/c but had worsening of HA on 4/10. As hgb up to 17.3, she underwent phlebotomy of one unit and CTA head done showing patent vessels but suboptimal evaluation due to artifact.  EEG done which showed moderate diffuse encephalopathy and was negative for seizures    Depakote was added with some improvement in HA. She developed SOB with intermittent tachycardia and CTA chest done showing acute BLL emboli and emphysematous changes seen in BUL. BLE dopplers were negative for DVT. IVC filter placed by Dr. Anselm Pancoast on 4/13 due to [place]. She developed A fib with RVR with short runs of NSVT on 04/14. Cardiology consulted for input and recommended IV metoprolol with question of cardioversion due to hypotension. She was transferred to ICU for monitoring and required IV amiodarone for rate recurrent flutter.  She developed increased in HA with drop in platelets to <24,00 and IV solumedrol added. Work up positive for HIT.  CT head repeated 4/15 showing hypodensity in straight sinus and follow up CT venogram done revealing extensive thrombus within confluence of sinuses and straight sinus extending into vein of Galen and fairly extensive multifocal thrombus within R>L transverse and sigmoid dural venous sinuses.  She underwent emergent thrombectomy with recanalization of deep system with decrease in clot burden. Follow up MRI brain 4/16 showed numerous tiny acute infarcts in bilateral cerebral and cerebellar hemispheres felt to be embolic, 2.3 cm acute cortical infarct right temporal occipital lobes likely venous infarct, new region of parenchymal swelling left frontal lobe and frontal operculum and unchanged subacute IVH without hydrocephalus.  She was started on hypertonic saline for edema control and case discussed with Hem/Onc who recommended lower therapeutic range of Argotroban.  Mentation continues to fluctuate with bouts of agitation  and lethargy. On tube feeds for nutritional supplement. She is able to follow some contextual cues with therapy but has had significant decline in functional status. CIR recommended for follow up therapy.    Review of Systems  Unable to perform ROS: Mental acuity            Past Medical History:  Diagnosis Date  . Hepatitis C    . Hyperlipidemia    . Hypertension    . Osteoporosis             Past Surgical History:  Procedure Laterality Date  . IR ANGIO INTRA EXTRACRAN SEL INTERNAL CAROTID BILAT MOD SED   02/24/2020  . IR ANGIO VERTEBRAL SEL VERTEBRAL UNI R MOD SED   02/24/2020  . IR IVC FILTER PLMT / S&I /IMG GUID/MOD SED   02/22/2020  . IR THROMBECT VENO MECH MOD SED   02/24/2020  . IR US GUIDE VASC ACCESS RIGHT   02/24/2020  . IR US GUIDE VASC ACCESS RIGHT   02/24/2020  . RADIOLOGY WITH ANESTHESIA N/A 02/24/2020    Procedure: IR WITH ANESTHESIA;  Surgeon: Radiologist, Medication, MD;  Location: [facility];  Service: Radiology;  Laterality: N/A;           Family History  Problem Relation Age of Onset  . Heart disease Mother    . Heart disease Father        Social History:  Married. Retired Radio broadcast assistant.  reports that she has quit smoking. She has never used smokeless tobacco. She reports that she does not drink alcohol or use drugs.           Allergies  Allergen Reactions  . Erythromycin Itching and Other (See Comments)      Severe stomach pains, diarrhea  . Heparin        Likely HIT          Medications Prior to Admission  Medication Sig Dispense Refill  . ALPRAZolam (XANAX) 0.5 MG tablet Take 0.25 mg by mouth daily as needed for anxiety.       Marland Kitchen amLODipine (NORVASC) 5 MG tablet Take 5 mg by mouth daily.      Marland Kitchen aspirin 81 MG tablet Take 1 tablet (81 mg total) by mouth daily. 60 tablet 2  . atorvastatin (LIPITOR) 10 MG tablet Take 10 mg by mouth every evening.       . Cholecalciferol (VITAMIN D3) 2000 UNITS TABS Take 2,000 Units by mouth every evening.      Marland Kitchen levothyroxine (SYNTHROID, LEVOTHROID) 125 MCG tablet Take 125 mcg by mouth daily before breakfast.      . losartan (COZAAR) 100 MG tablet Take 100 mg by mouth every evening.      . Probiotic Product (ALIGN PO) Take 1 capsule by mouth daily.      . SYMBICORT 160-4.5 MCG/ACT inhaler  Inhale 1 puff into the lungs daily as needed (shortness of breath).       . TRELEGY ELLIPTA 100-62.5-25 MCG/INH AEPB Inhale 1 puff into the lungs daily as needed (shortness of breath).           Home: Home Living Family/patient expects to be discharged to:: Private residence Living Arrangements: Spouse/significant other Available Help at Discharge: Family Type of Home: House Home Access: Stairs to enter Technical brewer of Steps: 2 Entrance Stairs-Rails: None Home Layout: Two level, Able to live on main level with bedroom/bathroom Bathroom Shower/Tub: Chiropodist: Standard Home Equipment: None  Functional History: Prior Function Level  of Independence: Independent Comments: pt drives, does everything for self PTA Functional Status:  Mobility: Bed Mobility Overal bed mobility: Modified Independent Bed Mobility: Supine to Sit, Sit to Supine Supine to sit: Supervision General bed mobility comments: egressed bed to chair position during session. Pt initially with R lateral lean upon entry requiring maxA for repositioning towards midline  Transfers Overall transfer level: Needs assistance Equipment used: None Transfers: Sit to/from Stand Sit to Stand: Min guard General transfer comment: attempted sit<>stand while in bed egress to chair with +2 HHA (via face to face method). Pt resisting all attempts and with strong posterior lean against therapists when attempting to bring trunk forward away from [facility]. Multiple attempts with pt resisting throughout Ambulation/Gait Ambulation/Gait assistance: Min guard Gait Distance (Feet): 100 Feet Assistive device: Rolling walker (2 wheeled) Gait Pattern/deviations: Step-through pattern, Decreased stride length General Gait Details: cues for proximity to RW. Pt directing distance but after 100' had to sit for grossly 3 min to recover due to fatigue prior to additional trial of 100' Gait velocity: decr Gait velocity  interpretation: 1.31 - 2.62 ft/sec, indicative of limited community ambulator   ADL: ADL Overall ADL's : Needs assistance/impaired Eating/Feeding: Set up, Sitting Grooming: Wash/dry face, Brushing hair, Set up, Min guard Grooming Details (indicate cue type and reason): max cueing to initiate  Lower Body Dressing: Total assistance, Sitting/lateral leans Lower Body Dressing Details (indicate cue type and reason): pt would not initiate donning sock when instructed  Toilet Transfer: Min guard Toilet Transfer Details (indicate cue type and reason): ambulated without A in session, however stated "I feel like Im drunk" when ambulating Toileting- Clothing Manipulation and Hygiene: Min guard Toileting - Clothing Manipulation Details (indicate cue type and reason): Safety Functional mobility during ADLs: Min guard, Minimal assistance General ADL Comments: pt with decrease in functional status compared to previous OT session   Cognition: Cognition Overall Cognitive Status: Impaired/Different from baseline Orientation Level: Oriented to person, Disoriented to time, Disoriented to situation, Disoriented to place Attention: Sustained Sustained Attention: Impaired Sustained Attention Impairment: Verbal complex Memory: Impaired Memory Impairment: Retrieval deficit(pt benefited from category cue for 3/5 items, multiple choice for 1/5 items and recalled one item without cue) Cognition Arousal/Alertness: Awake/alert Behavior During Therapy: Flat affect Overall Cognitive Status: Impaired/Different from baseline Area of Impairment: Orientation, Attention, Following commands, Safety/judgement, Awareness, Problem solving Orientation Level: Disoriented to, Place, Time, Situation Current Attention Level: Focused Memory: Decreased short-term memory Following Commands: Follows one step commands inconsistently Safety/Judgement: Decreased awareness of safety, Decreased awareness of deficits Awareness:  Intellectual Problem Solving: Slow processing, Decreased initiation, Requires verbal cues, Requires tactile cues General Comments: pt requires max multimodal cueing to follow simple commands - doing so inconsistently. Appears pt is better able to carry out purposeful/functional task (i.e. combing hair, wash face) when instructed vs simple instruction (squeeze my hand). Pt often responding to questions with "possibly" and "there's no tellin'" when asked     Blood pressure (!) 161/64, pulse 72, temperature (!) 97.5 F (36.4 C), resp. rate 18, height 5\' 2"  (1.575 m), weight 72.9 kg, SpO2 100 %. Physical Exam  General: Alert and oriented x 0, No apparent distress HEENT: Head is normocephalic, atraumatic, PERRLA, EOMI, sclera anicteric, oral mucosa pink and moist, dentition intact, NGT in place.   Neck: Supple without JVD or lymphadenopathy Heart: Reg rate and rhythm. No murmurs rubs or gallops Chest: CTA bilaterally without wheezes, rales, or rhonchi; no distress Abdomen: Soft, non-tender, non-distended, bowel sounds positive. Extremities: No clubbing, cyanosis, or  edema. Pulses are 2+ Skin: Clean and intact without signs of breakdown Neuro/MSK: Unable to follow commands. Nonsensical vocalization. Opens eyes and can make eye contact during exam. Severe expressive and receptive aphasia.  Psych: Pt's affect is inappropriate--aphasic, unable to follow commands     Lab Results Last 24 Hours       Results for orders placed or performed during the hospital encounter of 02/13/20 (from the past 24 hour(s))  APTT     Status: Abnormal    Collection Time: 02/27/20  6:46 PM  Result Value Ref Range    aPTT 61 (H) 24 - 36 seconds  Sodium     Status: Abnormal    Collection Time: 02/27/20  9:45 PM  Result Value Ref Range    Sodium 155 (H) 135 - 145 mmol/L  CBC     Status: Abnormal    Collection Time: 02/28/20  4:16 AM  Result Value Ref Range    WBC 14.0 (H) 4.0 - 10.5 K/uL    RBC 3.24 (L) 3.87 - 5.11  MIL/uL    Hemoglobin 9.8 (L) 12.0 - 15.0 g/dL    HCT 30.5 (L) 36.0 - 46.0 %    MCV 94.1 80.0 - 100.0 fL    MCH 30.2 26.0 - 34.0 pg    MCHC 32.1 30.0 - 36.0 g/dL    RDW 16.6 (H) 11.5 - 15.5 %    Platelets 100 (L) 150 - 400 K/uL    nRBC 0.0 0.0 - 0.2 %  Basic metabolic panel     Status: Abnormal    Collection Time: 02/28/20  4:16 AM  Result Value Ref Range    Sodium 147 (H) 135 - 145 mmol/L    Potassium 3.0 (L) 3.5 - 5.1 mmol/L    Chloride 116 (H) 98 - 111 mmol/L    CO2 16 (L) 22 - 32 mmol/L    Glucose, Bld 149 (H) 70 - 99 mg/dL    BUN 12 8 - 23 mg/dL    Creatinine, Ser 0.81 0.44 - 1.00 mg/dL    Calcium 9.1 8.9 - 10.3 mg/dL    GFR calc non Af Amer >60 >60 mL/min    GFR calc Af Amer >60 >60 mL/min    Anion gap 15 5 - 15  APTT     Status: Abnormal    Collection Time: 02/28/20  4:16 AM  Result Value Ref Range    aPTT 56 (H) 24 - 36 seconds  Sodium     Status: Abnormal    Collection Time: 02/28/20  9:20 AM  Result Value Ref Range    Sodium 147 (H) 135 - 145 mmol/L  Magnesium     Status: None    Collection Time: 02/28/20 12:33 PM  Result Value Ref Range    Magnesium 2.2 1.7 - 2.4 mg/dL  Phosphorus     Status: None    Collection Time: 02/28/20 12:33 PM  Result Value Ref Range    Phosphorus 2.6 2.5 - 4.6 mg/dL  Glucose, capillary     Status: Abnormal    Collection Time: 02/28/20  3:43 PM  Result Value Ref Range    Glucose-Capillary 137 (H) 70 - 99 mg/dL       Imaging Results (Last 48 hours)  CT HEAD WO CONTRAST   Result Date: 02/28/2020 CLINICAL DATA:  Follow-up cerebral hemorrhage and dural venous sinus thrombosis. EXAM: CT HEAD WITHOUT CONTRAST TECHNIQUE: Contiguous axial images were obtained from the base of the skull through the vertex  without intravenous contrast. COMPARISON:  02/26/2020 FINDINGS: Brain: Left greater than right temporo-occipital edema has improved with a small discrete subacute cortical infarct again noted laterally in the right temporo-occipital region.  No new cortically based infarct is identified. Hemorrhage in the right greater than left lateral ventricles demonstrates slight redistribution but has not significantly changed in volume. The ventricles are unchanged in size including slight dilatation of the right temporal horn. There is no midline shift or extra-axial fluid collection. Mild left cerebellar edema is less conspicuous. A background of diffuse hypoattenuation throughout the cerebral white matter is again noted and nonspecific but compatible with severe chronic small vessel ischemic disease. There is no midline shift or extra-axial fluid collection. Vascular: Similar appearance of hyperdensity along the internal cerebral veins, vein of Galen, straight sinus, and left transverse sinus corresponding to known thrombosis. Calcified atherosclerosis at the skull base. Skull: No fracture or suspicious osseous lesion. Sinuses/Orbits: Partially visualized bubbly fluid in the right maxillary sinus. Clear mastoid air cells. Unremarkable orbits. Other: None. IMPRESSION: 1. Decreased left greater than right temporo-occipital edema. 2. Unchanged intraventricular hemorrhage. No hydrocephalus. 3. Known venous sinus thrombosis. 4. No new intracranial abnormality identified. Electronically Signed   By: Logan Bores M.D.   On: 02/28/2020 13:48    [place] EKG SITE RITE   Result Date: 02/27/2020 If Site Rite image not attached, placement could not be confirmed due to current cardiac rhythm.        Assessment/Plan: Diagnosis: IVH 1. Does the need for close, 24 hr/day medical supervision in concert with the patient's rehab needs make it unreasonable for this patient to be served in a less intensive setting? Yes 2. Co-Morbidities requiring supervision/potential complications:  HTN, overweight (BMI 29.4), dysphagia, impaired cognition, severe chronic ischemic microangiopathy 3. Due to bladder management, bowel management, safety, skin/wound care, disease management,  medication administration, pain management and patient education, does the patient require 24 hr/day rehab nursing? Yes 4. Does the patient require coordinated care of a physician, rehab nurse, therapy disciplines of PT, OT, SLP to address physical and functional deficits in the context of the above medical diagnosis(es)? Yes Addressing deficits in the following areas: balance, endurance, locomotion, strength, transferring, bowel/bladder control, bathing, dressing, feeding, grooming, toileting, cognition, speech, language, swallowing and psychosocial support 5. Can the patient actively participate in an intensive therapy program of at least 3 hrs of therapy per day at least 5 days per week? Yes 6. The potential for patient to make measurable gains while on inpatient rehab is excellent 7. Anticipated functional outcomes upon discharge from inpatient rehab are mod assist  with PT, mod assist with OT, mod assist with SLP. 8. Estimated rehab length of stay to reach the above functional goals is: 20-24 days 9. Anticipated discharge destination: Home 10. Overall Rehab/Functional Prognosis: excellent and good   RECOMMENDATIONS: This patient's condition is appropriate for continued rehabilitative care in the following setting: CIR Patient has agreed to participate in recommended program. N/A Note that insurance prior authorization may be required for reimbursement for recommended care.   Comment: Mrs. Dowding may be a good CIR candidate if better able to engage in therapy and follow commands. We will continue to follow in her care. Thank you for this consult.    Victoria Leriche, PA-C 02/28/2020    I have personally performed a face to face diagnostic evaluation, including, but not limited to relevant history and physical exam findings, of this patient and developed relevant assessment and plan.  Additionally, I have reviewed  and concur with the physician assistant's documentation above.   Leeroy Cha,  MD        Revision History                     Routing History

## 2020-03-13 NOTE — Care Management (Signed)
Inpatient [facility] Individual Statement of Services  Patient Name:  [name]  Date:  03/13/2020  Welcome to the [facility].  Our goal is to provide you with an individualized program based on your diagnosis and situation, designed to meet your specific needs.  With this comprehensive rehabilitation program, you will be expected to participate in at least 3 hours of rehabilitation therapies Monday-Friday, with modified therapy programming on the weekends.  Your rehabilitation program will include the following services:  Physical Therapy (PT), Occupational Therapy (OT), Speech Therapy (ST), 24 hour per day rehabilitation nursing, Therapeutic Recreaction (TR), Neuropsychology, Case Management (Social Worker), Rehabilitation Medicine, Nutrition Services, Pharmacy Services and Other  Weekly team conferences will be held on Wednesdays to discuss your progress.  Your Social Worker will talk with you frequently to get your input and to update you on team discussions.  Team conferences with you and your family in attendance may also be held.  Expected length of stay: 20-24 Days  Overall anticipated outcome: MOD I  Depending on your progress and recovery, your program may change. Your Social Worker will coordinate services and will keep you informed of any changes. Your Social Worker's name and contact numbers are listed  below.  The following services may also be recommended but are not provided by the [facility]:    [place] will be made to provide these services after discharge if needed.  Arrangements include referral to agencies that provide these services.  Your insurance has been verified to be:  Airline [employer] Your primary doctor is:  Cari Caraway  Pertinent information will be shared with your doctor and your insurance company.  Social Worker:  Erlene Quan, [place] or [phone]    Information discussed with and copy given to patient by: [name], 03/13/2020, 11:48 AM

## 2020-03-13 NOTE — Progress Notes (Signed)
[name]   DOB:[birth year]   YN#:[id number]   QMV#:[id number]  Subjective:  [name] now in Rehab. We are waiting on her being able to take a regular diet reliably to switch her to an oral anticoagulant  Objective:  Vitals:   03/12/20 1931 03/13/20 0343  BP: 126/62 126/61  Pulse: 84 78  Resp: 16 16  Temp: 97.9 F (36.6 C) 98.1 F (36.7 C)  SpO2: 96% 97%    Body mass index is 29.4 kg/m.  Intake/Output Summary (Last 24 hours) at 03/13/2020 0813 Last data filed at 03/12/2020 2300 Gross per 24 hour  Intake 110 ml  Output -  Net 110 ml      Ref Range & Units 7 d ago 2 wk ago  Heparin Induced Plt Ab 0.000 - 0.400 OD 1.469High   2.553High  CM        CBG (last 3)  Recent Labs    03/12/20 2354 03/13/20 0341 03/13/20 0733  GLUCAP 157* 139* 109*     Labs:  Lab Results  Component Value Date   WBC 11.6 (H) 03/13/2020   HGB 7.8 (L) 03/13/2020   HCT 25.0 (L) 03/13/2020   MCV 102.0 (H) 03/13/2020   PLT 445 (H) 03/13/2020   NEUTROABS 11.4 (H) 03/09/2020    _0 @  Urine Studies No results for input(s): UHGB, CRYS in the last 72 hours.  Invalid input(s): UACOL, UAPR, USPG, UPH, UTP, UGL, UKET, UBIL, UNIT, UROB, ULEU, UEPI, UWBC, URBC, UBAC, CAST, UCOM, [place]  Basic Metabolic Panel: Recent Labs  Lab 03/08/20 0500 03/08/20 0500 03/09/20 0500 03/09/20 0500 03/10/20 0500 03/10/20 0500 03/11/20 0451 03/12/20 0536  NA 142  --  140  --  139  --  137 136  K 3.9   < > 4.0   < > 4.2   < > 4.0 4.3  CL 108  --  107  --  107  --  106 106  CO2 23  --  24  --  24  --  24 20*  GLUCOSE 77  --  153*  --  200*  --  139* 137*  BUN 32*  --  30*  --  31*  --  28* 25*  CREATININE 0.90  --  0.83  --  0.83  --  0.87 0.79  CALCIUM 9.4  --  8.9  --  8.7*  --  8.7* 9.0   < > = values in this interval not displayed.   GFR Estimated Creatinine Clearance: 58.5 mL/min (by C-G formula based on SCr of 0.79 mg/dL). Liver Function Tests: Recent Labs  Lab 03/10/20 0500  03/11/20 0451  AST 36 22  ALT 54* 47*  ALKPHOS 124 78  BILITOT 0.6 0.6  PROT 4.8* 4.9*  ALBUMIN 2.5* 2.5*   No results for input(s): LIPASE, AMYLASE in the last 168 hours. No results for input(s): AMMONIA in the last 168 hours. Coagulation profile No results for input(s): INR, PROTIME in the last 168 hours.  CBC: Recent Labs  Lab 03/07/20 0608 03/07/20 0608 03/08/20 0500 03/08/20 0500 03/09/20 0500 03/10/20 0500 03/11/20 0451 03/12/20 0536 03/13/20 0726  WBC 13.3*   < > 14.0*   < > 13.1* 12.2* 13.1* 14.6* 11.6*  NEUTROABS 10.5*  --  11.3*  --  11.4*  --   --   --   --   HGB 8.6*   < > 8.8*   < > 7.7* 7.4* 7.8* 8.8* 7.8*  HCT 28.5*   < >  29.0*   < > 24.8* 24.0* 25.1* 28.9* 25.0*  MCV 101.8*   < > 102.5*   < > 99.2 101.3* 101.2* 103.6* 102.0*  PLT 149*   < > 188   < > 223 268 328 449* 445*   < > = values in this interval not displayed.   Cardiac Enzymes: No results for input(s): CKTOTAL, CKMB, CKMBINDEX, TROPONINI in the last 168 hours. BNP: Invalid input(s): POCBNP CBG: Recent Labs  Lab 03/12/20 1521 03/12/20 1935 03/12/20 2354 03/13/20 0341 03/13/20 0733  GLUCAP 132* 124* 157* 139* 109*   D-Dimer No results for input(s): DDIMER in the last 72 hours. Hgb A1c No results for input(s): HGBA1C in the last 72 hours. Lipid Profile Recent Labs    03/12/20 0536  TRIG 101   Thyroid function studies No results for input(s): TSH, T4TOTAL, T3FREE, THYROIDAB in the last 72 hours.  Invalid input(s): FREET3 Anemia work up No results for input(s): VITAMINB12, FOLATE, FERRITIN, TIBC, IRON, RETICCTPCT in the last 72 hours. Microbiology No results found for this or any previous visit (from the past 240 hour(s)).    Studies:  No results found.  Assessment/plan: 74 y.o. [name] woman presenting 02/13/2020 with headache, found to have biventricular bleeds in the setting of panmyelosis, subsequently with pulmonary emboli and sinus venous thromboses in the setting of  heparin induced thrombocytopenia  (1) polycythemia vera  (a) JAK2 mutation positive confirming diagnosis  (b) bone marrow biopsy 02/13/2020 shows no evidence of leukemia, c/w P Vera  (c)  leukapheresis x3 . Last leukapheresis Friday 02/18/2020   (i) HD catheter removed after pheresis on  02/18/2020  (d) hydrea as needed to keep HCT <45 and platelets in normal range   (2) heparin induced thrombocytopenia: positive HIT screen x2  (a) severe thrombocythemia noted 02/24/2020  (b) bilateral pulmonary emboli 02/22/2020   (i) s/p IVC filter placement 02/22/2020   (ii) atrial fibrillation with RVR  (c) venous sinus thromboses 02/24/2020   (i) s/p mechanical thrombectomy  (d) argatroban started 02/25/2020  (e) platelet count responded to heparin withdrawal  (f) confirmatory serotonin release assay negative (see discussion below)   (i) continue argatroban for anticoagulation, no heparin   (ii) repeat HIT screen at [facility] lab again strongly positive  (g) platelets normalized as of 03/08/2020  (3) drug-induced thrombocytopenia  (a) discontinued amiodarone, inessential meds   (i) remains in sinus  (b) resolved-- platelets now WNL  (4) anemia: reticulocytes increasing; no folate, B-12 or iron deficiency; normal renal function; LDH slightly elevated but negative DAT and normal t bil (no evidence of hemolysis)--likely patient has occult bleeding--will start protonix empirically and follow   Chauncey Cruel, MD 03/13/2020  8:13 AM Medical Oncology and Hematology [facility] [place], [place] [address] Tel. [phone]    Fax. [phone]

## 2020-03-13 NOTE — Progress Notes (Signed)
[place] PHYSICAL MEDICINE & REHABILITATION PROGRESS NOTE  Subjective/Complaints:  Oriented to person only   ROS: Unable to assess due to cognition  Objective: Vital Signs: Blood pressure 126/61, pulse 78, temperature 98.1 F (36.7 C), resp. rate 16, weight 72.9 kg, SpO2 97 %. No results found. Recent Labs    03/12/20 0536 03/13/20 0726  WBC 14.6* 11.6*  HGB 8.8* 7.8*  HCT 28.9* 25.0*  PLT 449* 445*   Recent Labs    03/11/20 0451 03/12/20 0536  NA 137 136  K 4.0 4.3  CL 106 106  CO2 24 20*  GLUCOSE 139* 137*  BUN 28* 25*  CREATININE 0.87 0.79  CALCIUM 8.7* 9.0    Physical Exam: BP 126/61 (BP Location: Right Arm)   Pulse 78   Temp 98.1 F (36.7 C)   Resp 16   Wt 72.9 kg   SpO2 97%   BMI 29.40 kg/m  Constitutional: No distress . Vital signs reviewed. HENT: Normocephalic.  Atraumatic.  + NG. Eyes: EOMI. No discharge. Cardiovascular: No JVD. Respiratory: Normal effort.  No stridor. GI: Non-distended. Skin: Warm and dry.  Intact. Psych: Restless.  Impulsive.  Confused. Musc: Lower extremity edema improving. Neurological: Alert Limited commands, bilateral lower extremities >/ 3/5  Assessment/Plan: 1. Functional deficits secondary to R>L brain bleeds which require 3+ hours per day of interdisciplinary therapy in a comprehensive inpatient rehab setting.  Physiatrist is providing close team supervision and 24 hour management of active medical problems listed below.  Physiatrist and rehab team continue to assess barriers to discharge/monitor patient progress toward functional and medical goals  Care Tool:  Bathing    Body parts bathed by patient: Right arm, Left arm, Chest, Abdomen, Front perineal area, Right upper leg, Left upper leg, Face   Body parts bathed by helper: Buttocks, Right lower leg, Left lower leg     Bathing assist Assist Level: Moderate Assistance - Patient 50 - 74%     Upper Body Dressing/Undressing Upper body dressing   What is  the patient wearing?: Pull over shirt    Upper body assist Assist Level: Moderate Assistance - Patient 50 - 74%    Lower Body Dressing/Undressing Lower body dressing      What is the patient wearing?: Incontinence brief     Lower body assist Assist for lower body dressing: Total Assistance - Patient < 25%     Toileting Toileting    Toileting assist Assist for toileting: 2 Helpers     Transfers Chair/bed transfer  Transfers assist     Chair/bed transfer assist level: Maximal Assistance - Patient 25 - 49%     Locomotion Ambulation   Ambulation assist      Assist level: Moderate Assistance - Patient 50 - 74% Assistive device: Hand held assist Max distance: 15'   Walk 10 feet activity   Assist     Assist level: Moderate Assistance - Patient - 50 - 74% Assistive device: Hand held assist   Walk 50 feet activity   Assist Walk 50 feet with 2 turns activity did not occur: Safety/medical concerns         Walk 150 feet activity   Assist Walk 150 feet activity did not occur: Safety/medical concerns         Walk 10 feet on uneven surface  activity   Assist Walk 10 feet on uneven surfaces activity did not occur: Safety/medical concerns         Wheelchair     Assist  Wheelchair activity did not occur: Safety/medical concerns         Wheelchair 50 feet with 2 turns activity    Assist    Wheelchair 50 feet with 2 turns activity did not occur: Safety/medical concerns       Wheelchair 150 feet activity     Assist Wheelchair 150 feet activity did not occur: Safety/medical concerns          Medical Problem List and Plan: 1.  Deficits with mobility, self-care, cognition secondary to R>L brain bleeds.  Continue CIR 2.  PE/Antithrombotics: -anticoagulation:  Pharmaceutical: Other (comment)On Argatroban as pradaxa cannot be crushed.               -antiplatelet therapy: N/A 3. Heaches/Pain Management: On depakote tid  for agitation as well as HA.    No c/os today              Monitor with increased emulation 4. Mood: LCSW to follow for evaluation and support.              -antipsychotic agents: N/A 5. Neuropsych: This patient is not capable of making decisions on her own behalf.  Telesitter for safety 6. Skin/Wound Care: Routine pressure relief measures.  7. Fluids/Electrolytes/Nutrition: Monitor I/Os.             Advance diet as tolerated 8. 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 Decadron tapered to 4 mg bid on 04/27--slow taper to off.   9. Polycythemia Vera: JAK2 mutation positive. S/p Leukapheresis X 3. Hydrea prn to keep HCT<45     10. HIT:  SRA negative but HIT screen at [facility] strongly positive.   Thrombocytopenia resolved  11. PAF/A fib with RVR:  Felt to be due to illness . Current recommendations are for [facility] for 6-9 months.   Controlled on 5/2             Monitor with increased activity 12.  Steroid-induced hyperglycemia on prediabetes: Hgb A1c-6.1. Monitor BS every 4 hours with 5 units for coverage as well as SSI for elevated BS   CBG (last 3)  Recent Labs    03/12/20 2354 03/13/20 0341 03/13/20 0733  GLUCAP 157* 139* 109*               Monitor with increased mobility 13. Delirium:  With sundowning. On amantadine for activation and Depakote for agitation. Low dose Seroquel resumed 4/29 pm.  May need to schedule Seroquel of persistent sundowning  14. Anemia: Continue to monitor with daily labs.   Hemoglobin 8.8 on 5/2, questionable reliability, labs ordered for tomorrow  Continue to monitor 15.  Post stroke dysphagia  Dysphagia 1, nectar liquids.   Continue tube feeds  Advance diet as tolerated 16.  Essential hypertension  Continue hydralazine 100 3 times daily  Continue losartan 50 twice daily   Vitals:   03/12/20 1931 03/13/20 0343  BP: 126/62 126/61  Pulse: 84 78  Resp: 16  16  Temp: 97.9 F (36.6 C) 98.1 F (36.7 C)  SpO2: 96% 97%   17.  Leukocytosis-likely steroid-induced  WBCs 14.6 on 5/2,?  Reliability, labs ordered for tomorrow  Afebrile 18.  Severe hypoalbuminemia  Supplement initiated 19.  Peripheral edema  Lasix 20 daily started on 5/1, output not recorded, pt incont 20.  Elevated BUN  Encourage fluids Stable 5/1-5/2 21.  Urinary incont- per husband indicates need to void and generally voids 15-20 min  later  22.  Anemia on anticoag will check stool guaic , per heme-onc starting PPI  LOS: 3 days A FACE TO [place] E [name] 03/13/2020, 8:28 AM

## 2020-03-13 NOTE — Progress Notes (Signed)
Occupational Therapy Session Note  Patient Details  Name: [name] MRN: [id number] Date of Birth: Mar 09, 1946  Today's Date: 03/13/2020 OT Individual Time: 1415-1526 OT Individual Time Calculation (min): 71 min   Short Term Goals: Week 1:  OT Short Term Goal 1 (Week 1): Pt will don shirt wiht S OT Short Term Goal 2 (Week 1): Pt will complete oral care wiht setup OT Short Term Goal 3 (Week 1): Pt will thread BLE into pants OT Short Term Goal 4 (Week 1): Pt will complete 2/3 components of toileting with MIN A for standing balance  Skilled Therapeutic Interventions/Progress Updates:    Pt greeted in bed with no c/o pain, hooked up to IV. Unable to tell therapist what she'd like to do, declined toileting. She needed assistance to don gripper socks while semi reclined in bed, supervision for supine<sit with HOB elevated. Min A for stand pivot<w/c with vcs. She then engaged in bathing/dressing tasks sit<stand at the sink. She required step by step cues for sequencing. CGA for sit<stand and Min A for dynamic standing balance during pericare and LB dressing tasks. Oral care and handwashing completed w/c level with vcs due to apraxia. She then verbalized that she had to void bladder. Stand pivot>toilet using grab bar completed with CGA. Assist required for clothing mgt due to urgency with pt having continent bladder void. Pt able to complete hygiene herself and assist OT with elevating clothing afterwards. Min A for stand pivot<w/c<bed due to fatigue. Supervision for transition to supine. Pt remained comfortably in bed with all needs within reach, LEs elevated for edema mgt as she has pitting edema in both feet, and bed alarm set.   Pt was also provided with w/c cushion and bilateral ELRs during session.   Therapy Documentation Precautions:  Precautions Precautions: Fall Precaution Comments: cortrak Restrictions Weight Bearing Restrictions: No Vital Signs: Therapy Vitals Temp: 98 F (36.7  C) Temp Source: Oral Pulse Rate: 83 Resp: 16 BP: 127/68 Patient Position (if appropriate): Lying Oxygen Therapy SpO2: 96 % O2 Device: Room Air Pain: Pain Assessment Pain Scale: 0-10 Pain Score: 0-No pain ADL: ADL Grooming: Minimal cueing Where Assessed-Grooming: Sitting at sink Upper Body Bathing: Moderate cueing Where Assessed-Upper Body Bathing: Sitting at sink Lower Body Bathing: Maximal assistance Where Assessed-Lower Body Bathing: Sitting at sink, Standing at sink Upper Body Dressing: Moderate assistance(for threadind d/t PICC and cortrak) Where Assessed-Upper Body Dressing: Sitting at sink Lower Body Dressing: Maximal assistance Where Assessed-Lower Body Dressing: Standing at sink, Sitting at sink Toileting: Maximal assistance Where Assessed-Toileting: Bedside Commode Toilet Transfer: Moderate assistance Toilet Transfer Method: Stand pivot Toilet Transfer Equipment: Bedside commode     Therapy/Group: Individual Therapy  Rilya Longo A Sadeen Wiegel 03/13/2020, 4:02 PM

## 2020-03-14 ENCOUNTER — Inpatient Hospital Stay (HOSPITAL_COMMUNITY): Payer: Medicare HMO | Admitting: Occupational Therapy

## 2020-03-14 ENCOUNTER — Inpatient Hospital Stay (HOSPITAL_COMMUNITY): Payer: Medicare HMO | Admitting: Physical Therapy

## 2020-03-14 ENCOUNTER — Inpatient Hospital Stay (HOSPITAL_COMMUNITY): Payer: Medicare HMO | Admitting: Speech Pathology

## 2020-03-14 LAB — HEPATIC FUNCTION PANEL
ALT: 40 U/L (ref 0–44)
AST: 22 U/L (ref 15–41)
Albumin: 2.8 g/dL — ABNORMAL LOW (ref 3.5–5.0)
Alkaline Phosphatase: 85 U/L (ref 38–126)
Bilirubin, Direct: 0.1 mg/dL (ref 0.0–0.2)
Indirect Bilirubin: 0.3 mg/dL
Total Bilirubin: 0.4 mg/dL (ref 0.3–1.2)
Total Protein: 5.1 g/dL — ABNORMAL LOW (ref 6.5–8.1)

## 2020-03-14 LAB — CBC
HCT: 27.2 % — ABNORMAL LOW (ref 36.0–46.0)
Hemoglobin: 8.6 g/dL — ABNORMAL LOW (ref 12.0–15.0)
MCH: 31.7 pg (ref 26.0–34.0)
MCHC: 31.6 g/dL (ref 30.0–36.0)
MCV: 100.4 fL — ABNORMAL HIGH (ref 80.0–100.0)
Platelets: 528 10*3/uL — ABNORMAL HIGH (ref 150–400)
RBC: 2.71 MIL/uL — ABNORMAL LOW (ref 3.87–5.11)
RDW: 20.7 % — ABNORMAL HIGH (ref 11.5–15.5)
WBC: 12.4 10*3/uL — ABNORMAL HIGH (ref 4.0–10.5)
nRBC: 0 % (ref 0.0–0.2)

## 2020-03-14 LAB — BASIC METABOLIC PANEL
Anion gap: 10 (ref 5–15)
BUN: 24 mg/dL — ABNORMAL HIGH (ref 8–23)
CO2: 22 mmol/L (ref 22–32)
Calcium: 8.9 mg/dL (ref 8.9–10.3)
Chloride: 103 mmol/L (ref 98–111)
Creatinine, Ser: 0.89 mg/dL (ref 0.44–1.00)
GFR calc Af Amer: >60 mL/min (ref >60)
GFR calc non Af Amer: >60 mL/min (ref >60)
Glucose, Bld: 164 mg/dL — ABNORMAL HIGH (ref 70–99)
Potassium: 4.2 mmol/L (ref 3.5–5.1)
Sodium: 135 mmol/L (ref 135–145)

## 2020-03-14 LAB — RETICULOCYTES
Immature Retic Fract: 22 % — ABNORMAL HIGH (ref 2.3–15.9)
RBC.: 2.67 MIL/uL — ABNORMAL LOW (ref 3.87–5.11)
Retic Count, Absolute: 188.2 10*3/uL — ABNORMAL HIGH (ref 19.0–186.0)
Retic Ct Pct: 7.1 % — ABNORMAL HIGH (ref 0.4–3.1)

## 2020-03-14 LAB — GLUCOSE, CAPILLARY
Glucose-Capillary: 113 mg/dL — ABNORMAL HIGH (ref 70–99)
Glucose-Capillary: 114 mg/dL — ABNORMAL HIGH (ref 70–99)
Glucose-Capillary: 140 mg/dL — ABNORMAL HIGH (ref 70–99)
Glucose-Capillary: 150 mg/dL — ABNORMAL HIGH (ref 70–99)
Glucose-Capillary: 159 mg/dL — ABNORMAL HIGH (ref 70–99)
Glucose-Capillary: 79 mg/dL (ref 70–99)

## 2020-03-14 LAB — SAVE SMEAR(SSMR), FOR PROVIDER SLIDE REVIEW

## 2020-03-14 LAB — APTT
aPTT: 49 seconds — ABNORMAL HIGH (ref 24–36)
aPTT: 54 seconds — ABNORMAL HIGH (ref 24–36)

## 2020-03-14 MED ORDER — INSULIN ASPART 100 UNIT/ML ~~LOC~~ SOLN: 0.0000 [IU] | Freq: Every day | SUBCUTANEOUS | Status: DC

## 2020-03-14 MED ORDER — LOSARTAN POTASSIUM 50 MG PO TABS
50.0000 mg | ORAL_TABLET | Freq: Two times a day (BID) | ORAL | Status: DC
  Administered 2020-03-14 – 2020-03-29 (×29): 50 mg via ORAL
  Filled 2020-03-14 (×30): qty 1

## 2020-03-14 MED ORDER — GLUCERNA 1.2 CAL PO LIQD
700.0000 mL | ORAL | Status: DC
  Administered 2020-03-14 – 2020-03-16 (×3): 700 mL
  Administered 2020-03-17: 711 mL
  Filled 2020-03-14 (×4): qty 711

## 2020-03-14 MED ORDER — APIXABAN 5 MG PO TABS
5.0000 mg | ORAL_TABLET | Freq: Two times a day (BID) | ORAL | 0 refills | Status: DC
Start: 2020-03-14 — End: 2020-03-14

## 2020-03-14 MED ORDER — INSULIN ASPART 100 UNIT/ML ~~LOC~~ SOLN: 5.0000 [IU] | Freq: Three times a day (TID) | SUBCUTANEOUS | Status: DC

## 2020-03-14 MED ORDER — FREE WATER
100.0000 mL | Freq: Three times a day (TID) | Status: DC
  Administered 2020-03-14 – 2020-03-22 (×24): 100 mL

## 2020-03-14 MED ORDER — ATORVASTATIN CALCIUM 10 MG PO TABS
10.0000 mg | ORAL_TABLET | Freq: Every day | ORAL | Status: DC
  Administered 2020-03-15 – 2020-03-29 (×14): 10 mg via ORAL
  Filled 2020-03-14 (×14): qty 1

## 2020-03-14 MED ORDER — AMLODIPINE BESYLATE 10 MG PO TABS
10.0000 mg | ORAL_TABLET | Freq: Every day | ORAL | Status: DC
  Administered 2020-03-15 – 2020-03-29 (×15): 10 mg via ORAL
  Filled 2020-03-14 (×15): qty 1

## 2020-03-14 MED ORDER — INSULIN ASPART 100 UNIT/ML ~~LOC~~ SOLN
5.0000 [IU] | Freq: Three times a day (TID) | SUBCUTANEOUS | Status: DC
  Administered 2020-03-15 – 2020-03-20 (×8): 5 [IU] via SUBCUTANEOUS

## 2020-03-14 MED ORDER — AMANTADINE HCL 100 MG PO CAPS
100.0000 mg | ORAL_CAPSULE | Freq: Two times a day (BID) | ORAL | Status: DC
  Administered 2020-03-15 – 2020-03-16 (×3): 100 mg via ORAL
  Filled 2020-03-14 (×4): qty 1

## 2020-03-14 MED ORDER — LEVOTHYROXINE SODIUM 25 MCG PO TABS
125.0000 ug | ORAL_TABLET | Freq: Every day | ORAL | Status: DC
  Administered 2020-03-15 – 2020-03-29 (×15): 125 ug via ORAL
  Filled 2020-03-14 (×15): qty 1

## 2020-03-14 MED ORDER — POTASSIUM CHLORIDE 20 MEQ PO PACK
20.0000 meq | PACK | Freq: Two times a day (BID) | ORAL | Status: DC
  Administered 2020-03-15 – 2020-03-29 (×29): 20 meq via ORAL
  Filled 2020-03-14 (×29): qty 1

## 2020-03-14 MED ORDER — VALPROIC ACID 250 MG/5ML PO SOLN
250.0000 mg | Freq: Two times a day (BID) | ORAL | Status: DC
  Administered 2020-03-14 – 2020-03-29 (×30): 250 mg via ORAL
  Filled 2020-03-14 (×30): qty 5

## 2020-03-14 MED ORDER — HYDRALAZINE HCL 50 MG PO TABS
100.0000 mg | ORAL_TABLET | Freq: Three times a day (TID) | ORAL | Status: DC
  Administered 2020-03-14 – 2020-03-25 (×31): 100 mg via ORAL
  Filled 2020-03-14 (×33): qty 2

## 2020-03-14 MED ORDER — INSULIN ASPART 100 UNIT/ML ~~LOC~~ SOLN
0.0000 [IU] | Freq: Three times a day (TID) | SUBCUTANEOUS | Status: DC
  Administered 2020-03-14 – 2020-03-15 (×2): 1 [IU] via SUBCUTANEOUS
  Administered 2020-03-15 – 2020-03-16 (×2): 2 [IU] via SUBCUTANEOUS
  Administered 2020-03-16 – 2020-03-17 (×3): 1 [IU] via SUBCUTANEOUS
  Administered 2020-03-17: 2 [IU] via SUBCUTANEOUS
  Administered 2020-03-18 – 2020-03-22 (×7): 1 [IU] via SUBCUTANEOUS

## 2020-03-14 MED ORDER — GLUCERNA 1.2 CAL PO LIQD: 700.0000 mL | ORAL | Status: DC

## 2020-03-14 NOTE — Plan of Care (Signed)
  Problem: RH BLADDER ELIMINATION Goal: RH STG MANAGE BLADDER WITH ASSISTANCE Description: STG Manage Bladder With Min I Assistance Outcome: Not Progressing; inc at times

## 2020-03-14 NOTE — Progress Notes (Signed)
[name] PHYSICAL MEDICINE & REHABILITATION PROGRESS NOTE  Subjective/Complaints:  No issues overnite   ROS: Unable to assess due to cognition  Objective: Vital Signs: Blood pressure 138/73, pulse 86, temperature 98 F (36.7 C), temperature source Oral, resp. rate 17, weight 72.6 kg, SpO2 97 %. No results found. Recent Labs    03/13/20 0726 03/14/20 0530  WBC 11.6* 12.4*  HGB 7.8* 8.6*  HCT 25.0* 27.2*  PLT 445* 528*   Recent Labs    03/13/20 0726 03/14/20 0530  NA 135 135  K 4.1 4.2  CL 100 103  CO2 24 22  GLUCOSE 125* 164*  BUN 21 24*  CREATININE 0.79 0.89  CALCIUM 8.6* 8.9    Physical Exam: BP 138/73 (BP Location: Right Arm)   Pulse 86   Temp 98 F (36.7 C) (Oral)   Resp 17   Wt 72.6 kg   SpO2 97%   BMI 29.27 kg/m   General: No acute distress Mood and affect are appropriate Heart: Regular rate and rhythm no rubs murmurs or extra sounds Lungs: Clear to auscultation, breathing unlabored, no rales or wheezes Abdomen: Positive bowel sounds, soft nontender to palpation, nondistended Extremities: No clubbing, cyanosis, or edema Skin: No evidence of breakdown, no evidence of rash  Neurological: Alert Limited commands, bilateral lower extremities >/ 3/5  Assessment/Plan: 1. Functional deficits secondary to R>L brain bleeds which require 3+ hours per day of interdisciplinary therapy in a comprehensive inpatient rehab setting.  Physiatrist is providing close team supervision and 24 hour management of active medical problems listed below.  Physiatrist and rehab team continue to assess barriers to discharge/monitor patient progress toward functional and medical goals  Care Tool:  Bathing    Body parts bathed by patient: Left arm, Chest, Abdomen, Front perineal area, Right upper leg, Left upper leg, Face, Right lower leg, Right arm, Left lower leg   Body parts bathed by helper: Buttocks     Bathing assist Assist Level: Minimal Assistance - Patient >  75%     Upper Body Dressing/Undressing Upper body dressing   What is the patient wearing?: Pull over shirt    Upper body assist Assist Level: Minimal Assistance - Patient > 75%    Lower Body Dressing/Undressing Lower body dressing      What is the patient wearing?: Incontinence brief, Pants     Lower body assist Assist for lower body dressing: Moderate Assistance - Patient 50 - 74%     Toileting Toileting    Toileting assist Assist for toileting: Moderate Assistance - Patient 50 - 74%     Transfers Chair/bed transfer  Transfers assist     Chair/bed transfer assist level: Maximal Assistance - Patient 25 - 49%     Locomotion Ambulation   Ambulation assist      Assist level: Moderate Assistance - Patient 50 - 74% Assistive device: Hand held assist Max distance: 15'   Walk 10 feet activity   Assist     Assist level: Moderate Assistance - Patient - 50 - 74% Assistive device: Hand held assist   Walk 50 feet activity   Assist Walk 50 feet with 2 turns activity did not occur: Safety/medical concerns         Walk 150 feet activity   Assist Walk 150 feet activity did not occur: Safety/medical concerns         Walk 10 feet on uneven surface  activity   Assist Walk 10 feet on uneven surfaces activity did not occur:  Safety/medical concerns         Wheelchair     Assist Will patient use wheelchair at discharge?: No(No PT LTG set)   Wheelchair activity did not occur: Safety/medical concerns         Wheelchair 50 feet with 2 turns activity    Assist    Wheelchair 50 feet with 2 turns activity did not occur: Safety/medical concerns       Wheelchair 150 feet activity     Assist Wheelchair 150 feet activity did not occur: Safety/medical concerns          Medical Problem List and Plan: 1.  Deficits with mobility, self-care, cognition secondary to R>L brain bleeds.  Continue CIR 2.   PE/Antithrombotics: -anticoagulation:  Pharmaceutical: Other (comment)On Argatroban as pradaxa cannot be crushed.               -antiplatelet therapy: N/A 3. Heaches/Pain Management: On depakote tid for agitation as well as HA.    No c/os today              Monitor with increased emulation 4. Mood: LCSW to follow for evaluation and support.              -antipsychotic agents: N/A 5. Neuropsych: This patient is not capable of making decisions on her own behalf.  Telesitter for safety 6. Skin/Wound Care: Routine pressure relief measures.  7. Fluids/Electrolytes/Nutrition: Monitor I/Os.             Advance diet as tolerated 8. 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 Decadron tapered to 4 mg bid on 04/27--slow taper to off.   9. Polycythemia Vera: JAK2 mutation positive. S/p Leukapheresis X 3. Hydrea prn to keep HCT<45    HCT 27 10. HIT:  SRA negative but HIT screen at [facility] positive.   Thrombocytopenia resolved  11. PAF/A fib with RVR:  Felt to be due to illness . Current recommendations are for [facility] for 6-9 months.   HR controlled 5/4             Monitor with increased activity 12.  Steroid-induced hyperglycemia on prediabetes: Hgb A1c-6.1. Monitor BS every 4 hours with 5 units for coverage as well as SSI for elevated BS   CBG (last 3)  Recent Labs    03/13/20 2032 03/14/20 0007 03/14/20 0417  GLUCAP 118* 79 159*               Monitor with increased mobility 13. Delirium:  With sundowning.Improving  On amantadine for activation and Depakote for agitation. Low dose Seroquel resumed 4/29 pm.  May need to schedule Seroquel of persistent sundowning  14. Anemia: Continue to monitor with daily labs.   Hemoglobin 8.6 5/4  Continue to monitor 15.  Post stroke dysphagia  Dysphagia 1, nectar liquids.   Continue tube feeds  Advance diet as tolerated 16.  Essential  hypertension  Continue hydralazine 100 3 times daily  Continue losartan 50 twice daily   Vitals:   03/13/20 2202 03/14/20 0307  BP: 127/67 138/73  Pulse:  86  Resp:  17  Temp:  98 F (36.7 C)  SpO2:  97%   17.  Leukocytosis-likely steroid-induced  WBCs 12.4 on 5/4 trending down   Afebrile 18.  Severe hypoalbuminemia  Supplement initiated 19.  Peripheral edema  Lasix 20 daily started on 5/1, output not recorded, pt incont 20.  Elevated BUN  Encourage fluids  Stable 5/1-5/2 21.  Urinary incont- per husband indicates need to void and generally voids 15-20 min later  22.  Anemia on anticoag will check stool guaic , per heme-onc starting PPI  LOS: 4 days A FACE TO [place] E Chaney Ingram 03/14/2020, 7:57 AM

## 2020-03-14 NOTE — Progress Notes (Signed)
ANTICOAGULATION CONSULT NOTE - Follow-Up  Pharmacy Consult for Argatroban Indication: HIT,  Bilateral PE  Allergies  Allergen Reactions  . Erythromycin Itching and Other (See Comments)    Severe stomach pains, diarrhea  . Heparin     Likely HIT, SRA negative    Patient Measurements: Weight: 72.6 kg (160 lb 0.9 oz)  Vital Signs: Temp: 98.1 F (36.7 C) (05/04 1352) Temp Source: Oral (05/04 1352) BP: 129/76 (05/04 1352) Pulse Rate: 86 (05/04 1352)  Labs: Recent Labs    03/12/20 0536 03/12/20 1641 03/13/20 0726 03/13/20 0726 03/13/20 1832 03/14/20 0530 03/14/20 1643  HGB 8.8*  --  7.8*  --   --  8.6*  --   HCT 28.9*  --  25.0*  --   --  27.2*  --   PLT 449*  --  445*  --   --  528*  --   APTT 53*   < > 52*   < > 46* 49* 54*  CREATININE 0.79  --  0.79  --   --  0.89  --    < > = values in this interval not displayed.    Estimated Creatinine Clearance: 52.5 mL/min (by C-G formula based on SCr of 0.89 mg/dL).  Assessment: 74 year old initially admitted 4/4 with acute IVH and thrombocythemia with PLTC of 990k. Patient was ultimately diagnosed with polythemia, now s/p leukophoresis, therapeutic phlebotomy, and initiation of hydroxyurea. On 4/13 - found to have B/L PE - decision was risk of AC was too great and IVC placed. CT scan 4/15 showed extensive thrombus within confluence of sinuses and sent to IR with mechanical thrombectomy x5.  SRA negative but HIT screen at [facility] strongly positive.  Thrombocytopenia resolved   aPTT now therapeutic on 0.25mcg/kg/min.   Lower goal of 50-60 sec due to high risk for bleeding complications with IVH Hgb stable  (ranged in 7s-8s last several days).  Pltc improved to wnl to high.  Goal of Therapy:  aPTT 50 - 60 seconds Monitor platelets by anticoagulation protocol: Yes   Plan: Continue argatroban to 0.75 mcg/kg/min (3.6 ml/hr)  Continue q12h aPTT checks Monitor daily CBC, s/sx bleeding Follow-up plans switching to oral  anticoagulation   Annajulia Lewing, Pharm.D., BCPS Clinical Pharmacist Clinical phone for 03/14/2020 is [phone].  **Pharmacist phone directory can be found on [email or web address] listed under [place].  03/14/2020 5:42 PM

## 2020-03-14 NOTE — Progress Notes (Signed)
Occupational Therapy Session Note  Patient Details  Name: AUSTRALIA DROLL MRN: [id number] Date of Birth: 1946-04-19  Today's Date: 03/14/2020 OT Individual Time: 1300-1330 OT Individual Time Calculation (min): 30 min    Short Term Goals: Week 1:  OT Short Term Goal 1 (Week 1): Pt will don shirt wiht S OT Short Term Goal 2 (Week 1): Pt will complete oral care wiht setup OT Short Term Goal 3 (Week 1): Pt will thread BLE into pants OT Short Term Goal 4 (Week 1): Pt will complete 2/3 components of toileting with MIN A for standing balance  Skilled Therapeutic Interventions/Progress Updates:  Patient met lying supine in agreement with OT treatment session with focus on therapeutic exercise and therapeutic activity as detailed below. Patient able to recall first and last name but unable to recall birthday without cueing. Patient reporting 0/10 pain at rest and with activity. Supine <> EOB with Min A and multimodal cues for sequencing. Patient competed 1 set x10 reps each of BUE HEP in unsupported sitting at EOB in prep for completion of ADLs. Marching in place for 30 sec x2 trials with focus on activity tolerance. Sit to stand x10 with arms crossed over chest in prep for functional transfers. Session concluded with patient lying supine with call bell within reach, bed alarm activated, and all needs met.   Therapy Documentation Precautions:  Precautions Precautions: Fall Precaution Comments: cortrak Restrictions Weight Bearing Restrictions: No  Therapy/Group: Individual Therapy  Emanuele Mcwhirter R [place] 03/14/2020, 10:31 AM

## 2020-03-14 NOTE — Progress Notes (Signed)
Occupational Therapy Session Note  Patient Details  Name: [name] MRN: [id number] Date of Birth: 08-Aug-1946  Today's Date: 03/14/2020 OT Individual Time: [id number] Individual Time Calculation (min): 45 min    Short Term Goals: Week 1:  OT Short Term Goal 1 (Week 1): Pt will don shirt wiht S OT Short Term Goal 2 (Week 1): Pt will complete oral care wiht setup OT Short Term Goal 3 (Week 1): Pt will thread BLE into pants OT Short Term Goal 4 (Week 1): Pt will complete 2/3 components of toileting with MIN A for standing balance  Skilled Therapeutic Interventions/Progress Updates:    Upon entering the room, pt supine in bed and sleeping soundly but able to arouse for participation in OT this session. Pt with no c/o pain and reports sleeping well. Supine >sit with min A to EOB. Pt reports urgency for toileting. Mod A stand pivot transfer with RW to [facility]. Pt needing assistance with clothing management secondary to urgency and began urinating as she sat down on commode chair. Pt able to void and washed peri are and buttocks with min A for standing balance. Pt doffing clothing items and donning clean brief and pull over pants with mod A to thread onto B feet. Pt able to pull over B hips with min A for standing balance. Pt transferring into wheelchair with min A stand pivot transfer. Set up A for breakfast with OT providing supervision. NT arriving to take over supervision at end of session with husband present in the room as well. Pt needing mod multimodal cuing for sequencing and initiation this session. Pt remained in wheelchair with chair alarm belt donned and call bell within reach.   Therapy Documentation Precautions:  Precautions Precautions: Fall Precaution Comments: cortrak Restrictions Weight Bearing Restrictions: No General: General PT Missed Treatment Reason: Patient fatigue Vital Signs: Therapy Vitals Pulse Rate: 98 Resp: 18 Patient Position (if appropriate):  Lying Oxygen Therapy O2 Device: Room Air ADL: ADL Grooming: Minimal cueing Where Assessed-Grooming: Sitting at sink Upper Body Bathing: Moderate cueing Where Assessed-Upper Body Bathing: Sitting at sink Lower Body Bathing: Maximal assistance Where Assessed-Lower Body Bathing: Sitting at sink, Standing at sink Upper Body Dressing: Moderate assistance(for threadind d/t PICC and cortrak) Where Assessed-Upper Body Dressing: Sitting at sink Lower Body Dressing: Maximal assistance Where Assessed-Lower Body Dressing: Standing at sink, Sitting at sink Toileting: Maximal assistance Where Assessed-Toileting: Bedside Commode Toilet Transfer: Moderate assistance Toilet Transfer Method: Stand pivot Toilet Transfer Equipment: Bedside commode   Therapy/Group: Individual Therapy  [name] 03/14/2020, 11:26 AM

## 2020-03-14 NOTE — Progress Notes (Signed)
MEDICATION RELATED CONSULT NOTE - FOLLOW UP   Pharmacy Consult: to ascertain from patient's insurance which anticoagulant is preferred on their formulary (xarelto, eliquis, pradaxa or other), benefits check Indication:  Bilateral pulmonary embolisms  Allergies  Allergen Reactions  . Erythromycin Itching and Other (See Comments)    Severe stomach pains, diarrhea  . Heparin     Likely HIT, SRA negative    Patient Measurements: Weight: 72.6 kg (160 lb 0.9 oz) Height: 62 inches  Vital Signs: Temp: 98.1 F (36.7 C) (05/04 1352) Temp Source: Oral (05/04 1352) BP: 129/76 (05/04 1352) Pulse Rate: 86 (05/04 1352) Intake/Output from previous day: 05/03 0701 - 05/04 0700 In: 117 [P.O.:117] Out: -  Intake/Output from this shift: Total I/O In: 60 [P.O.:60] Out: -   Labs: Recent Labs    03/12/20 0536 03/12/20 1641 03/13/20 0726 03/13/20 1832 03/14/20 0530  WBC 14.6*  --  11.6*  --  12.4*  HGB 8.8*  --  7.8*  --  8.6*  HCT 28.9*  --  25.0*  --  27.2*  PLT 449*  --  445*  --  528*  APTT 53*   < > 52* 46* 49*  CREATININE 0.79  --  0.79  --  0.89  ALBUMIN  --   --   --   --  2.8*  PROT  --   --   --   --  5.1*  AST  --   --   --   --  22  ALT  --   --   --   --  40  ALKPHOS  --   --   --   --  85  BILITOT  --   --   --   --  0.4  BILIDIR  --   --   --   --  0.1  IBILI  --   --   --   --  0.3   < > = values in this interval not displayed.   Estimated Creatinine Clearance: 52.5 mL/min (by C-G formula based on SCr of 0.89 mg/dL).     Assessment: Benefits check completed with patient's insurance, [employer].  DOACs:  Apixaban, Rivaroxaban and Dabigatran are all covered by her insurance plan.   Apixaban and Rivaroxaban are tier 3 and copay is $47/30 day supply for each of these medications.    Pradaxa is a tier 4, thus will be higher copay. I did not get the price for pradaxa (dabigatran) as you can not crush pradaxa, must give capsules whole.   Both apixaban and rivaroxaban may  be crushed.     Thank you for allowing pharmacy to be part of this patients care team.  Nicole Cella, RPh Clinical Pharmacist Please check AMION for all [place] phone numbers After 10:00 PM, call [place] [phone] 03/14/2020,4:19 PM

## 2020-03-14 NOTE — Progress Notes (Signed)
ANNIBELLE BRAZIE   DOB:[birth year]   ME#:[id number]   IWL#:[id number]  Subjective:  [name] is resting in bed today. Working with therapy. Has no bleeding. Remains on argatroban. Planning to transition to [place] once taking diet reliably and once preferred DOAC confirmed with insurance (TOC consult placed X2). Spoke with PA from rehab team who indicates Letecia can take medications by mouth if they are crushed.   Objective:  Vitals:   03/14/20 0307 03/14/20 0824  BP: 138/73   Pulse: 86 98  Resp: 17 18  Temp: 98 F (36.7 C)   SpO2: 97%     Body mass index is 29.27 kg/m.  Intake/Output Summary (Last 24 hours) at 03/14/2020 1253 Last data filed at 03/14/2020 0730 Gross per 24 hour  Intake 177 ml  Output -  Net 177 ml      Ref Range & Units 7 d ago 2 wk ago  Heparin Induced Plt Ab 0.000 - 0.400 OD 1.469High   2.553High  CM        CBG (last 3)  Recent Labs    03/14/20 0417 03/14/20 0845 03/14/20 1142  GLUCAP 159* 114* 113*     Labs:  Lab Results  Component Value Date   WBC 12.4 (H) 03/14/2020   HGB 8.6 (L) 03/14/2020   HCT 27.2 (L) 03/14/2020   MCV 100.4 (H) 03/14/2020   PLT 528 (H) 03/14/2020   NEUTROABS 11.4 (H) 03/09/2020    '@LASTCHEMISTRY' @  Urine Studies No results for input(s): UHGB, CRYS in the last 72 hours.  Invalid input(s): UACOL, UAPR, USPG, UPH, UTP, UGL, [place], UBIL, UNIT, UROB, [place], UEPI, UWBC, [place], UBAC, [place], [place], [place]  Basic Metabolic Panel: Recent Labs  Lab 03/10/20 0500 03/10/20 0500 03/11/20 0451 03/11/20 0451 03/12/20 0536 03/12/20 0536 03/13/20 0726 03/14/20 0530  NA 139  --  137  --  136  --  135 135  K 4.2   < > 4.0   < > 4.3   < > 4.1 4.2  CL 107  --  106  --  106  --  100 103  CO2 24  --  24  --  20*  --  24 22  GLUCOSE 200*  --  139*  --  137*  --  125* 164*  BUN 31*  --  28*  --  25*  --  21 24*  CREATININE 0.83  --  0.87  --  0.79  --  0.79 0.89  CALCIUM 8.7*  --  8.7*  --  9.0  --  8.6* 8.9   < > = values in this  interval not displayed.   GFR Estimated Creatinine Clearance: 52.5 mL/min (by C-G formula based on SCr of 0.89 mg/dL). Liver Function Tests: Recent Labs  Lab 03/10/20 0500 03/11/20 0451 03/14/20 0530  AST 36 22 22  ALT 54* 47* 40  ALKPHOS 124 78 85  BILITOT 0.6 0.6 0.4  PROT 4.8* 4.9* 5.1*  ALBUMIN 2.5* 2.5* 2.8*   No results for input(s): LIPASE, AMYLASE in the last 168 hours. No results for input(s): AMMONIA in the last 168 hours. Coagulation profile No results for input(s): INR, PROTIME in the last 168 hours.  CBC: Recent Labs  Lab 03/08/20 0500 03/08/20 0500 03/09/20 0500 03/09/20 0500 03/10/20 0500 03/11/20 0451 03/12/20 0536 03/13/20 0726 03/14/20 0530  WBC 14.0*   < > 13.1*   < > 12.2* 13.1* 14.6* 11.6* 12.4*  NEUTROABS 11.3*  --  11.4*  --   --   --   --   --   --  HGB 8.8*   < > 7.7*   < > 7.4* 7.8* 8.8* 7.8* 8.6*  HCT 29.0*   < > 24.8*   < > 24.0* 25.1* 28.9* 25.0* 27.2*  MCV 102.5*   < > 99.2   < > 101.3* 101.2* 103.6* 102.0* 100.4*  PLT 188   < > 223   < > 268 328 449* 445* 528*   < > = values in this interval not displayed.   Cardiac Enzymes: No results for input(s): CKTOTAL, CKMB, CKMBINDEX, TROPONINI in the last 168 hours. BNP: Invalid input(s): POCBNP CBG: Recent Labs  Lab 03/13/20 2032 03/14/20 0007 03/14/20 0417 03/14/20 0845 03/14/20 1142  GLUCAP 118* 79 159* 114* 113*   D-Dimer No results for input(s): DDIMER in the last 72 hours. Hgb A1c No results for input(s): HGBA1C in the last 72 hours. Lipid Profile Recent Labs    03/12/20 0536  TRIG 101   Thyroid function studies No results for input(s): TSH, T4TOTAL, T3FREE, THYROIDAB in the last 72 hours.  Invalid input(s): FREET3 Anemia work up Recent Labs    03/14/20 0530  RETICCTPCT 7.1*   Microbiology No results found for this or any previous visit (from the past 240 hour(s)).    Studies:  No results found.  Assessment/plan: 74 y.o. [place] woman presenting  02/13/2020 with headache, found to have biventricular bleeds in the setting of panmyelosis, subsequently with pulmonary emboli and sinus venous thromboses in the setting of heparin induced thrombocytopenia  (1) polycythemia vera  (a) JAK2 mutation positive confirming diagnosis  (b) bone marrow biopsy 02/13/2020 shows no evidence of leukemia, c/w P Vera  (c)  leukapheresis x3 . Last leukapheresis Friday 02/18/2020   (i) HD catheter removed after pheresis on  02/18/2020  (d) hydrea as needed to keep HCT <45 and platelets in normal range   (2) heparin induced thrombocytopenia: positive HIT screen x2  (a) severe thrombocythemia noted 02/24/2020  (b) bilateral pulmonary emboli 02/22/2020   (i) s/p IVC filter placement 02/22/2020   (ii) atrial fibrillation with RVR  (c) venous sinus thromboses 02/24/2020   (i) s/p mechanical thrombectomy  (d) argatroban started 02/25/2020  (e) platelet count responded to heparin withdrawal  (f) confirmatory serotonin release assay negative (see discussion below)   (i) continue argatroban for anticoagulation, no heparin   (ii) repeat HIT screen at [facility] lab again strongly positive  (g) platelets normalized as of 03/08/2020  (3) drug-induced thrombocytopenia  (a) discontinued amiodarone, inessential meds   (i) remains in sinus  (b) resolved-- platelets now elevated  (4) anemia: reticulocytes increasing; no folate, B-12 or iron deficiency; normal renal function; LDH slightly elevated but negative DAT and normal t bil (no evidence of hemolysis)--likely patient has occult bleeding--on protonix  Plan: Katiya is now working with rehab. Remains on argatroban without any evidence of bleeding. Able to take meds crushed. Still awaiting information from [facility] about which DOAC is preferred by her insurance. I spoke with the PA from rehab and they are aware of this request and are working on it. Additionally, I have also placed a pharmacy consult to see if they can  help US obtain this information.   I was asked by the PA from rehab about whether we needed for the patient to be on dexamethasone from a hematology standpoint. This medication was started by Neurology due to edema and headaches. She does not need to be on this medication from our standpoint. Will defer to Neurology/Rehab team about when to taper this  medication.   [name], NP 03/14/2020  12:53 PM Medical Oncology and Hematology [facility] [address] [place], [place] [address] Tel. [phone]    Fax. [phone]

## 2020-03-14 NOTE — Progress Notes (Signed)
Nutrition Brief Note  72-hour calorie count ordered and started today, 03/14/20, at lunch meal. Calorie count will end on 03/17/20 after breakfast meal. RD will provide results of first 24 hours of calorie count tomorrow. Discussed with RN. Calorie count envelope and instructions are posted on pt's door.  Tube feeds changed to nocturnal to promote PO intake during the day: - Glucerna 1.2 @ 70 ml/hr to run for 10 hours from 1900 to 0500 - Pro-stat 30 ml daily - Free water 100 ml q 8 hours  Nocturnal tube feeding regimen and free water provides 940 kcal, 57 grams of protein, and 864 ml of H2O (55% kcal needs, 67% protein needs).   Gaynell Face, MS, RD, LDN Inpatient Clinical Dietitian Pager: [phone] Weekend/After Hours: [phone]

## 2020-03-14 NOTE — Progress Notes (Addendum)
[place] for Argatroban Indication: HIT,  Bilateral PE  Allergies  Allergen Reactions  . Erythromycin Itching and Other (See Comments)    Severe stomach pains, diarrhea  . Heparin     Likely HIT, SRA negative    Patient Measurements: Weight: 72.6 kg (160 lb 0.9 oz)  Vital Signs: Temp: 98 F (36.7 C) (05/04 0307) Temp Source: Oral (05/04 0307) BP: 138/73 (05/04 0307) Pulse Rate: 98 (05/04 0824)  Labs: Recent Labs    03/12/20 0536 03/12/20 1641 03/13/20 0726 03/13/20 1832 03/14/20 0530  HGB 8.8*  --  7.8*  --  8.6*  HCT 28.9*  --  25.0*  --  27.2*  PLT 449*  --  445*  --  528*  APTT 53*   < > 52* 46* 49*  CREATININE 0.79  --  0.79  --  0.89   < > = values in this interval not displayed.    Estimated Creatinine Clearance: 52.5 mL/min (by C-G formula based on SCr of 0.89 mg/dL).  Assessment: 74 year old initially admitted 4/4 with acute IVH and thrombocythemia with PLTC of 990k. Patient was ultimately diagnosed with polythemia, now s/p leukophoresis, therapeutic phlebotomy, and initiation of hydroxyurea. On 4/13 - found to have B/L PE - decision was risk of AC was too great and IVC placed. CT scan 4/15 showed extensive thrombus within confluence of sinuses and sent to IR with mechanical thrombectomy x5.   SRA negative but HIT screen at [facility] strongly positive.  Thrombocytopenia resolved   aPTT has been stable last few days but fell lastis evening to slightly sub- therapeutic at 46 sec on agatroban 0.5mcg/kg/min (goal 50-60 seconds). Currently using a dosing weight of 79.7 kg. Argatroban infusion rate was increased to 0.8mcg/kg/min last evening.  Today's 0500 AM aPTT = 49 seconds,  Increased from last PM's value, however still slightly subtherapeutic.  Lower goal of 50-60 sec due to high risk for bleeding complications with IVH Hgb stable  (ranged in 7s-8s last several days).  Pltc improved to wnl to high. RN reports  argatroban infusing without complications or interruptions and no bleeding noted.  Continuing Argatroban as pradaxa cannot be crushed.  Goal of Therapy:  aPTT 50 - 60 seconds Monitor platelets by anticoagulation protocol: Yes   Plan: Will  increase argatroban to 0.75 mcg/kg/min (3.6 ml/hr)  Continue q12h aPTT checks Monitor daily CBC, s/sx bleeding Follow-up plans switching to [place], RPh Clinical Pharmacist [phone] Please check AMION for all [place] phone numbers After 10:00 PM, call [place] [phone] 03/14/2020 9:15 AM

## 2020-03-14 NOTE — Progress Notes (Signed)
Physical Therapy Session Note  Patient Details  Name: [name] MRN: [id number] Date of Birth: May 18, 1946  Today's Date: 03/14/2020 PT Individual Time: 0940-1010 PT Individual Time Calculation (min): 30 min   Short Term Goals: Week 1:  PT Short Term Goal 1 (Week 1): Pt will ambulate 25' w/ min assist w/ LRAD PT Short Term Goal 2 (Week 1): Pt will initiate stair training PT Short Term Goal 3 (Week 1): Pt will initiate functional tasks w/ min cues 100% of the time PT Short Term Goal 4 (Week 1): Pt will maintain dynamic standing balance w/ min assist  Skilled Therapeutic Interventions/Progress Updates: Pt presented in w/c with nsg and husband present. Pt notably fatigued upon arrival. Per husband pt has been up since OT session at 7:30. PTA unable to re-arrange schedule but discussed option of trying to space out therapies in future as well as slowly building up OOB tolerance. Once nsg completed adjusting K-pump PTA took pt out of room for external stimulation. Pt transferred to ortho gym and attempted UBE for endurance and sustained activity. Pt was able to complete goal of 3 min on L1 however after one minute began resting head on machine. Pt transferred back to room and performed HHA stand pivot transfer with minA back to bed. Pt stated just felt "awful" but unable to quantify pain/nausea/etc. Pt was able to complete supine to sit with CGA and began sleeping by the time PTA completed setting bed alarm and bed settings. Pt left resting comfortably with call bell within reach and nsg notified of pt's disposition.       Therapy Documentation Precautions:  Precautions Precautions: Fall Precaution Comments: cortrak Restrictions Weight Bearing Restrictions: No General: PT Amount of Missed Time (min): 45 Minutes PT Missed Treatment Reason: Patient fatigue Vital Signs: Therapy Vitals Pulse Rate: 98 Resp: 18 Patient Position (if appropriate): Lying Oxygen Therapy O2 Device: Room  Air    Therapy/Group: Individual Therapy  Koralynn Greenspan  Yul Diana, PTA  03/14/2020, 10:40 AM

## 2020-03-14 NOTE — Progress Notes (Signed)
Speech Language Pathology Daily Session Note  Patient Details  Name: [name] MRN: [id number] Date of Birth: 1946-05-14  Today's Date: 03/14/2020 SLP Individual Time: 1347-1431 SLP Individual Time Calculation (min): 44 min  Short Term Goals: Week 1: SLP Short Term Goal 1 (Week 1): Patient will tolerate PO trials of thin liquids with no overt s/sx of aspiration. SLP Short Term Goal 2 (Week 1): Patient will consume PO trials of dysphagia 2 textures with adequate mastication and oral clearance with no overt s/sx of aspiration. SLP Short Term Goal 3 (Week 1): Patient will consume current pureed diet and nectar thick liquids with no overt s/sx of aspriation. SLP Short Term Goal 4 (Week 1): Patient will answer conrete yes/no questions with 80% accuracy and max cues. SLP Short Term Goal 5 (Week 1): Patient will follow 2 step directions with 80% accuracy and max cues. SLP Short Term Goal 6 (Week 1): Patient will name simple/fucntional objects with 80% accuracy and max cues.  Skilled Therapeutic Interventions: Pt was seen for skilled ST targeting dysphagia and communication goals. She was alert and cooperative throughout session. Pt accepted upgraded trials of thin H2O via cup after oral care with 1 immediate throat clear noted (~3 oz intake total). Recommend initiation of the free water protocol, continue dysphagia 2 solid and nectar thick liquids otherwise. Per water protocol, pt may now have thin H2O in between meals when oral care if performed prior to intake and with full staff supervision. Signs posted at [facility] with more detailed explanation of protocol.  During targeted speech tasks, when provided picture collage of family members, pt identified husband and sons' by name and described events within pictures with ~75% accuracy when provided Max A question cues. During confrontation naming tasks targeting functional object identification, she demonstrated 60% accuracy with Max A verbal cues; she  was most receptive to semantic function cues. Although pt did not initiate any conversational exchanges, she answered questions to express her basic wants and needs with Mod assist verbal cues for clarification and to provide choices. Pt left laying in bed with alarm set, needs within reach. Continue per current plan of care.       Pain Pain Assessment Pain Scale: 0-10 Pain Score: 0-No pain  Therapy/Group: Individual Therapy  Arbutus Leas 03/14/2020, 7:24 AM

## 2020-03-15 ENCOUNTER — Inpatient Hospital Stay (HOSPITAL_COMMUNITY): Payer: Medicare HMO | Admitting: Physical Therapy

## 2020-03-15 ENCOUNTER — Inpatient Hospital Stay (HOSPITAL_COMMUNITY): Payer: Medicare HMO | Admitting: Occupational Therapy

## 2020-03-15 LAB — CBC
HCT: 29 % — ABNORMAL LOW (ref 36.0–46.0)
Hemoglobin: 8.9 g/dL — ABNORMAL LOW (ref 12.0–15.0)
MCH: 31.3 pg (ref 26.0–34.0)
MCHC: 30.7 g/dL (ref 30.0–36.0)
MCV: 102.1 fL — ABNORMAL HIGH (ref 80.0–100.0)
Platelets: 640 10*3/uL — ABNORMAL HIGH (ref 150–400)
RBC: 2.84 MIL/uL — ABNORMAL LOW (ref 3.87–5.11)
RDW: 20.7 % — ABNORMAL HIGH (ref 11.5–15.5)
WBC: 13.4 10*3/uL — ABNORMAL HIGH (ref 4.0–10.5)
nRBC: 0 % (ref 0.0–0.2)

## 2020-03-15 LAB — BASIC METABOLIC PANEL
Anion gap: 10 (ref 5–15)
BUN: 24 mg/dL — ABNORMAL HIGH (ref 8–23)
CO2: 23 mmol/L (ref 22–32)
Calcium: 9.3 mg/dL (ref 8.9–10.3)
Chloride: 100 mmol/L (ref 98–111)
Creatinine, Ser: 0.99 mg/dL (ref 0.44–1.00)
GFR calc Af Amer: >60 mL/min (ref >60)
GFR calc non Af Amer: 57 mL/min — ABNORMAL LOW (ref >60)
Glucose, Bld: 196 mg/dL — ABNORMAL HIGH (ref 70–99)
Potassium: 4.3 mmol/L (ref 3.5–5.1)
Sodium: 133 mmol/L — ABNORMAL LOW (ref 135–145)

## 2020-03-15 LAB — APTT
aPTT: 58 seconds — ABNORMAL HIGH (ref 24–36)
aPTT: 56 seconds — ABNORMAL HIGH (ref 24–36)

## 2020-03-15 LAB — GLUCOSE, CAPILLARY
Glucose-Capillary: 172 mg/dL — ABNORMAL HIGH (ref 70–99)
Glucose-Capillary: 129 mg/dL — ABNORMAL HIGH (ref 70–99)
Glucose-Capillary: 119 mg/dL — ABNORMAL HIGH (ref 70–99)
Glucose-Capillary: 142 mg/dL — ABNORMAL HIGH (ref 70–99)

## 2020-03-15 LAB — TRIGLYCERIDES: Triglycerides: 85 mg/dL (ref <150)

## 2020-03-15 MED ORDER — PANTOPRAZOLE SODIUM 40 MG PO TBEC
40.0000 mg | DELAYED_RELEASE_TABLET | Freq: Every day | ORAL | Status: DC
  Administered 2020-03-16 – 2020-03-29 (×14): 40 mg via ORAL
  Filled 2020-03-15 (×14): qty 1

## 2020-03-15 MED ORDER — SENNOSIDES-DOCUSATE SODIUM 8.6-50 MG PO TABS
2.0000 | ORAL_TABLET | Freq: Two times a day (BID) | ORAL | Status: DC
  Administered 2020-03-15 – 2020-03-24 (×18): 2
  Filled 2020-03-15 (×18): qty 2

## 2020-03-15 MED ORDER — DEXAMETHASONE 2 MG PO TABS
2.0000 mg | ORAL_TABLET | Freq: Two times a day (BID) | ORAL | Status: DC
  Administered 2020-03-15 – 2020-03-17 (×5): 2 mg via ORAL
  Filled 2020-03-15 (×5): qty 1

## 2020-03-15 MED ORDER — CHLORHEXIDINE GLUCONATE CLOTH 2 % EX PADS
6.0000 | MEDICATED_PAD | Freq: Two times a day (BID) | CUTANEOUS | Status: DC
  Administered 2020-03-16 – 2020-03-28 (×21): 6 via TOPICAL

## 2020-03-15 MED ORDER — HYDROXYUREA 100 MG/ML ORAL SUSPENSION
500.0000 mg | Freq: Every day | ORAL | Status: DC
  Administered 2020-03-15 – 2020-03-25 (×11): 500 mg via ORAL
  Filled 2020-03-15 (×11): qty 5

## 2020-03-15 NOTE — Progress Notes (Signed)
Calorie Count Note: Day 1  72-hour calorie count ordered.  Diet: Dysphagia 2 with nectar-thick liquids Supplements:  - Magic cup TID with meals, each supplement provides 290 kcal and 9 grams of protein  Day 1: 03/14/20 Lunch: 76 kcal, 3 grams protein 03/14/20 Dinner: 125 kcal, 6 grams protein 03/15/20 Breakfast: 54 kcal, 3 grams protein Supplements: bites of 1 Magic Cup  Total 24-hour PO intake: 255 kcal (15% of minimum estimated needs)  12 grams protein (14% of minimum estimated needs)  Nutrition Diagnosis: Inadequate oral intake related to lethargy/confusion, dysphagia as evidenced by meal completion < 50%.  Goal: Patient will meet greater than or equal to 90% of their needs  Intervention: - Continue 72-hour calorie count - Continue Magic Cup TID with meals - Continue nocturnal tube feeds of Glucerna 1.2 @ 70 ml/hr for 10 hours from 1900 to 0500 with Pro-stat 30 ml daily   Gaynell Face, MS, RD, LDN Inpatient Clinical Dietitian Pager: [phone] Weekend/After Hours: [phone]

## 2020-03-15 NOTE — Patient Care Conference (Signed)
Inpatient RehabilitationTeam Conference and Plan of Care Update Date: 03/15/2020   Time: 10:05 AM    Patient Name: [name]      Medical Record Number: [id number]  Date of Birth: Mar 02, 1946 Sex: Female         Room/Bed: 4W17C/4W17C-01 Payor Info: Payor: [employer] MEDICARE / Plan: [place] MEDICARE HMO/PPO / Product Type: *No Product type* /    Admit Date/Time:  03/10/2020  6:06 PM  Primary Diagnosis:  Nontraumatic subcortical hemorrhage of right cerebral hemisphere [facility])  Patient Active Problem List   Diagnosis Date Noted  . Sundowning   . Elevated BUN   . Peripheral edema   . Impulsive   . Hypoalbuminemia due to protein-calorie malnutrition ([place])   . Leucocytosis   . Dysphagia, post-stroke   . Anemia   . Prediabetes   . Steroid-induced hyperglycemia   . Embolic stroke ([place]) 0000000  . Dysphagia   . Vascular headache   . Thrombocytosis ([place])   . Venous thrombosis   . On amiodarone therapy   . Polycythemia   . Encounter for central line care   . Nontraumatic subcortical hemorrhage of right cerebral hemisphere ([place])   . Abnormal CT of the chest   . Atrial flutter with rapid ventricular response ([place])   . Pulmonary embolus ([place]) 02/22/2020  . Polycythemia vera ([place]) 02/17/2020  . Likely Cerebral amyloid angiopathy ([place]) 02/17/2020  . IVH (intraventricular hemorrhage) ([place]) 02/14/2020  . TIA (transient ischemic attack) 08/17/2014  . Acute sinusitis 08/16/2014  . Headache 08/15/2014  . Hypothyroidism 08/15/2014  . Hepatitis C   . Hyperlipidemia   . Hypertension     Expected Discharge Date: Expected Discharge Date: 03/29/20  Team Members Present: Physician leading conference: Dr. Alysia Penna Care Coodinator Present: Karene Fry, RN, MSN;Christina Sampson Goon, BSW;Deborah Tobin Chad, RN, BSN, CRRN Nurse Present: Rosita Fire, RN PT Present: Barrie Folk, PT;Rosita Dechalus, PTA OT Present: Darleen Crocker, OT SLP Present: Jettie Booze, CF-SLP PPS Coordinator present  : Ileana Ladd, Burna Mortimer, SLP     Current Status/Progress Goal Weekly Team Focus  Bowel/Bladder   Patient is incontinent of bowel and bladder. LBM 03/12/20  obtain continence  q 2-3 hours toileting and PRN assistance for toileting needs   Swallow/Nutrition/ Hydration   Dys 2 (minced/ground), nectar, water protocol, full supervision  Min A  increased participation/PO intake, independence with strategies, tolerance current diet and upgraded water protocol   ADL's   Min A bathing w/c level at sink sit<stand, Min A UB dressing, Mod A LB dressing, CGA stand pivot toilet transfer, Mod A toileting  Supervision  NMR, functional cognition, standing balance, activity tolerance, functional transfers, ADL retraining   Mobility   CGA bed mobility, minA transfers, modA gait with RW with increased R latera lean with fatigue, low endurance threshold  supervision overall with short distance gait, minA stairs  endurance, global strengthening, balance, gait, transfers, d/c planning   Communication   Mod-Max A  Min A  naming tasks, word finding strategies, following basic directions   Safety/Cognition/ Behavioral Observations  Mod-Max A  Min A  orientation, sustained attention to functional tasks, basic problem solving, initiation   Pain   Patient denies pain  maintain 0 pain level with or without activity  q shift and PRN pain assessment   Skin   patient has non-pitting edema in BLE, ecchymosis is both arms  prevent skin from breakdown and infection  q shift and PRN skin assessment    Rehab Goals Patient on target to  meet rehab goals: Yes Rehab Goals Revised: Patient currently being evaluated. *See Care Plan and progress notes for long and short-term goals.     Barriers to Discharge  Current Status/Progress Possible Resolutions Date Resolved   Nursing                  PT                    OT                  SLP                SW                Discharge Planning/Teaching Needs:   Patient and spouse goal is to [place] home  Will schedule education if reccommended   Team Discussion: MD 74 yo B IVH, Hgb too high, taking meds, PE's, steroid taper, cog deficits, hematology following, TFs at night.  RN impulsive, telesitter, coretrak, husband here today, ? Change protonix to PO, MD says will change protonix.  OT min/mod bathing, mod A toileting, goals S.  PT try to space out therapies.  CGA/min A transfers, min/mod 30' RW, low endurance, S goals.  SLP upgraded to [place], started on water protocol, rec/exp language deficits, min/mod goals for cognition.   Revisions to Treatment Plan: SLP upgraded diet to D2/nectar thick liquids.     Medical Summary Current Status: Continues with confusion orientation, poor appetite, still has NG tube with nocturnal feedings Weekly Focus/Goal: Transition to p.o. feedings and adequate oral fluid intake, medication adjustments  Barriers to Discharge: Nutrition means;Other (comments)  Barriers to Discharge Comments: Cognition secondary to bilateral intraventricular bleeds Possible Resolutions to Barriers: Continue rehabilitation program, appreciate hematology input weaning dexamethasone   Continued Need for Acute Rehabilitation Level of Care: The patient requires daily medical management by a physician with specialized training in physical medicine and rehabilitation for the following reasons: Direction of a multidisciplinary physical rehabilitation program to maximize functional independence : Yes Medical management of patient stability for increased activity during participation in an intensive rehabilitation regime.: Yes Analysis of laboratory values and/or radiology reports with any subsequent need for medication adjustment and/or medical intervention. : Yes   I attest that I was present, lead the team conference, and concur with the assessment and plan of the team.   Retta Diones 03/15/2020, 2:11 PM   Team conference was held  via web/ teleconference due to [place] - 19

## 2020-03-15 NOTE — Progress Notes (Signed)
Physical Therapy Session Note  Patient Details  Name: [name] MRN: [id number] Date of Birth: 03-12-1946  Today's Date: 03/15/2020 PT Individual Time: 0905-1000 and 1130-1205 PT Individual Time Calculation (min): 55 min   Short Term Goals: Week 1:  PT Short Term Goal 1 (Week 1): Pt will ambulate 25' w/ min assist w/ LRAD PT Short Term Goal 2 (Week 1): Pt will initiate stair training PT Short Term Goal 3 (Week 1): Pt will initiate functional tasks w/ min cues 100% of the time PT Short Term Goal 4 (Week 1): Pt will maintain dynamic standing balance w/ min assist  Skilled Therapeutic Interventions/Progress Updates: Tx1 Pt presented in bed with family, MD, and NT eating breakfast. Pt was able to take several bites of food with encouragement from family. Per MD ok to use TED hose for BLE edema. Family left after meal task and PTA donned TED hose total A. Pt performed supine to sit CGA with use of bed features and increased time. PTA threaded pants and donned grip socks total A for time management. Pt performed STS from EOB with CGA and was able to pull pants over hips with minA. Pt then stated urgent need for void. Pt instructed to perform stand pivot to w/c however ?urgency, pt unable to process that only needed to sit in chair. Pt proceeds to perform stand pivot with CGA then attempted to lower pants. When instructed to not remove pants and to only sit pt required multiple multimodal cues to ultimately sit in w/c. Pt then transferred to toilet and was able to perform stand pivot with use of wall rail and CGA (+void). Once completed pt able to perform peri-care with supervision, stood with CGA and required minA for LB clothing management. Pt transferred back to w/c in same manner as prior and taken to sink to perform hand hygiene with supervision. Pt then transferred to day room and performed ambulatory transfer to NuStep with RW and overall CGA. Participated in NuStep L1 x 4 min for global  conditioning and sustained task to activity. Pt was able to maintain avg 24SPM with a 13 on BORG scale. Pt then performed ambulatory transfer back to w/c in same manner as prior and transferred back to room. Performed ambulatory transfer with RW to bed and performed sit to supine transfer to flat bed with close S and use of bed rail. Pt repositioned to comfort and left with bed alarm on, call bell within reach and needs met.   Tx2: Pt presented in bed awake and agreeable to therapy. Pt performed supine to sit with supervision and use of bed features. PTA donned grip socks total A with pt indicating need for void. Performed stand pivot transfer to w/c with CGA and improved initiation this session as compared to earlier session. Pt transferred to toilet and performed stand pivot with wall rail to toilet with CGA and toilet transfers with CGA and minA for clothing management (+void). Once completed pt was supervision for peri-care and CGA for STS from elevated toilet seat with PTA providing minA for clothing management. Pt then transferred to sink and performed hand hygiene with set up. Pt then transferred to rehab gym and attempted participated in horseshoes for standing tolerance and balance. Pt was able to stand with CGA and verbal cues to push off from w/c vs holding onto RW. Pt was able to follow commands to reach and throw first horseshoe however was unable to process grasping any additional ones and would just reach  for air despite max multimodal cues to reach for horseshoe. Follow 4 horseshoes were performed with [facility] assist and pt reaching and throwing "horseshoes" with nothing in hand. Pt however was able to maintain standing for x 4 min without knee instability nor increased sway. Once pt in sitting pt was able to complete task of grasping and throwing items with only verbal cues. Pt returned to room and transferred to recliner to encourage OOB tolerance. Performed stand pivot transfer with minA for  sequencing and set up in recliner. Pt left in recliner with belt alarm on, legs elevated, call bell within reach and current needs met.      Therapy Documentation Precautions:  Precautions Precautions: Fall Precaution Comments: cortrak Restrictions Weight Bearing Restrictions: No   Therapy/Group: Individual Therapy  Zac Torti  Beren Yniguez, PTA  03/15/2020, 2:51 PM

## 2020-03-15 NOTE — Progress Notes (Signed)
ANTICOAGULATION CONSULT NOTE - Follow-Up  Pharmacy Consult for Argatroban Indication: HIT,  Bilateral PE  Allergies  Allergen Reactions  . Erythromycin Itching and Other (See Comments)    Severe stomach pains, diarrhea  . Heparin     Likely HIT, SRA negative    Patient Measurements: Weight: 69.6 kg (153 lb 7 oz)  Vital Signs: Temp: 97.7 F (36.5 C) (05/05 0435) BP: 134/67 (05/05 0435) Pulse Rate: 82 (05/05 0435)  Labs: Recent Labs    03/13/20 0726 03/13/20 1832 03/14/20 0530 03/14/20 1643 03/15/20 0454  HGB 7.8*  --  8.6*  --  8.9*  HCT 25.0*  --  27.2*  --  29.0*  PLT 445*  --  528*  --  640*  APTT 52*   < > 49* 54* 58*  CREATININE 0.79  --  0.89  --  0.99   < > = values in this interval not displayed.    Estimated Creatinine Clearance: 46.3 mL/min (by C-G formula based on SCr of 0.99 mg/dL).  Assessment: 74 year old initially admitted 4/4 with acute IVH and thrombocythemia with PLTC of 990k. Patient was ultimately diagnosed with polythemia vera, now s/p leukopheresis, therapeutic phlebotomy, and initiation of hydroxyurea. On 4/13 - found to have B/L PE - decision was risk of AC was too great and IVC placed. CT scan 4/15 showed extensive thrombus within confluence of sinuses and sent to IR with mechanical thrombectomy x5.  SRA negative but H[facility] screen [facility] strongly positive.  Thrombocytopenia resolved   APTT = 58 seconds, remains therapeutic on 0.71mcg/kg/min.  Lower goal of 50-60 sec due to high risk for bleeding complications with IVH Hgb stable  (ranged in 7s-8s last several days).  Hct is 29.   Pltc improved to wnl to high.  Goal of Therapy:  aPTT 50 - 60 seconds Monitor platelets by anticoagulation protocol: Yes   Plan: Continue argatroban to 0.75 mcg/kg/min (3.6 ml/hr)  Continue q12h aPTT checks Monitor daily CBC, s/sx bleeding Follow-up plans switching to oral anticoagulation  Nicole Cella, RPh Clinical Pharmacist Clinical phone for 03/15/2020  is [phone]. **Pharmacist phone directory can be found on [email or web address] listed under [place]. 03/15/2020 10:22 AM

## 2020-03-15 NOTE — Progress Notes (Signed)
ANTICOAGULATION CONSULT NOTE - Follow-Up  Pharmacy Consult for Argatroban Indication: HIT,  Bilateral PE  Allergies  Allergen Reactions  . Erythromycin Itching and Other (See Comments)    Severe stomach pains, diarrhea  . Heparin     Likely HIT, SRA negative    Patient Measurements: Weight: 69.6 kg (153 lb 7 oz)  Vital Signs: Temp: 98.5 F (36.9 C) (05/05 1526) Temp Source: Oral (05/05 1526) BP: 111/55 (05/05 1526) Pulse Rate: 81 (05/05 1526)  Labs: Recent Labs    03/13/20 0726 03/13/20 1832 03/14/20 0530 03/14/20 0530 03/14/20 1643 03/15/20 0454 03/15/20 1632  HGB 7.8*  --  8.6*  --   --  8.9*  --   HCT 25.0*  --  27.2*  --   --  29.0*  --   PLT 445*  --  528*  --   --  640*  --   APTT 52*   < > 49*   < > 54* 58* 56*  CREATININE 0.79  --  0.89  --   --  0.99  --    < > = values in this interval not displayed.    Estimated Creatinine Clearance: 46.3 mL/min (by C-G formula based on SCr of 0.99 mg/dL).  Assessment: 74 year old initially admitted 4/4 with acute IVH and thrombocythemia with platelet count of 990K. Patient was ultimately diagnosed with polythemia vera, now is S/P leukopheresis, therapeutic phlebotomy, and initiation of hydroxyurea. On 4/13, pt was found to have bilateral PE - risk of AC was judged too great and IVC was placed. CT scan 4/15 showed extensive thrombus within confluence of sinuses; pt was sent to IR for mechanical thrombectomy X 5.  SRA was negative, but HIT screen at [facility] was strongly positive.Thrombocytopenia has resolved (current platelet count is 640K).  aPTT this afternoon on argatroban infusion at 0.75 mcg/kg/min was 56 sec, which remains within the goal range for this pt (lower goal of 50-60 sec, due to high risk for bleeding complications with IVH). H/H stable. Per RN, no issues with IV or bleeding observed.  Goal of Therapy:  aPTT 50 - 60 seconds Monitor platelets by anticoagulation protocol: Yes   Plan: Continue  argatroban at 0.75 mcg/kg/min Continue q12h aPTT checks Monitor daily CBC, s/sx bleeding Follow-up plans switching to oral anticoagulation  Gillermina Hu, PharmD, BCPS, [facility] Clinical Pharmacist 03/15/2020 5:15 PM

## 2020-03-15 NOTE — Progress Notes (Signed)
PHYSICAL MEDICINE & REHABILITATION PROGRESS NOTE  Subjective/Complaints:  Appreciate hematology note.  Patient's intake has been poor less than 50% of meals.  Continues with tube feeding at night but it was not shut off prior to breakfast today.  ROS: Unable to assess due to cognition  Objective: Vital Signs: Blood pressure 134/67, pulse 82, temperature 97.7 F (36.5 C), resp. rate 18, weight 69.6 kg, SpO2 98 %. No results found. Recent Labs    03/14/20 0530 03/15/20 0454  WBC 12.4* 13.4*  HGB 8.6* 8.9*  HCT 27.2* 29.0*  PLT 528* 640*   Recent Labs    03/14/20 0530 03/15/20 0454  NA 135 133*  K 4.2 4.3  CL 103 100  CO2 22 23  GLUCOSE 164* 196*  BUN 24* 24*  CREATININE 0.89 0.99  CALCIUM 8.9 9.3    Physical Exam: BP 134/67   Pulse 82   Temp 97.7 F (36.5 C)   Resp 18   Wt 69.6 kg   SpO2 98%   BMI 28.06 kg/m    General: No acute distress Mood and affect are appropriate Heart: Regular rate and rhythm no rubs murmurs or extra sounds Lungs: Clear to auscultation, breathing unlabored, no rales or wheezes Abdomen: Positive bowel sounds, soft nontender to palpation, nondistended Extremities: No clubbing, cyanosis, or edema Skin: No evidence of breakdown, no evidence of rash Neurologic: Oriented to person cranial nerves II through XII intact, motor strength is 5/5 in bilateral deltoid, bicep, tricep, grip, hip flexor, knee extensors, ankle dorsiflexor and plantar flexor Sensory exam normal sensation to light touch and proprioception in bilateral upper and lower extremities Cerebellar exam normal finger to nose to finger as well as heel to shin in bilateral upper and lower extremities Musculoskeletal: Full range of motion in all 4 extremities. No joint swelling   Assessment/Plan: 1. Functional deficits secondary to R>L brain bleeds which require 3+ hours per day of interdisciplinary therapy in a comprehensive inpatient rehab setting.  Physiatrist is  providing close team supervision and 24 hour management of active medical problems listed below.  Physiatrist and rehab team continue to assess barriers to discharge/monitor patient progress toward functional and medical goals  Care Tool:  Bathing    Body parts bathed by patient: Left arm, Chest, Abdomen, Front perineal area, Right upper leg, Left upper leg, Face, Right lower leg, Right arm, Left lower leg   Body parts bathed by helper: Buttocks     Bathing assist Assist Level: Minimal Assistance - Patient > 75%     Upper Body Dressing/Undressing Upper body dressing   What is the patient wearing?: Pull over shirt    Upper body assist Assist Level: Minimal Assistance - Patient > 75%    Lower Body Dressing/Undressing Lower body dressing      What is the patient wearing?: Incontinence brief, Pants     Lower body assist Assist for lower body dressing: Moderate Assistance - Patient 50 - 74%     Toileting Toileting    Toileting assist Assist for toileting: Moderate Assistance - Patient 50 - 74%     Transfers Chair/bed transfer  Transfers assist     Chair/bed transfer assist level: Moderate Assistance - Patient 50 - 74%     Locomotion Ambulation   Ambulation assist      Assist level: Moderate Assistance - Patient 50 - 74% Assistive device: Hand held assist Max distance: 15'   Walk 10 feet activity   Assist     Assist level:  Moderate Assistance - Patient - 50 - 74% Assistive device: Hand held assist   Walk 50 feet activity   Assist Walk 50 feet with 2 turns activity did not occur: Safety/medical concerns         Walk 150 feet activity   Assist Walk 150 feet activity did not occur: Safety/medical concerns         Walk 10 feet on uneven surface  activity   Assist Walk 10 feet on uneven surfaces activity did not occur: Safety/medical concerns         Wheelchair     Assist Will patient use wheelchair at discharge?: No(No PT LTG  set)   Wheelchair activity did not occur: Safety/medical concerns         Wheelchair 50 feet with 2 turns activity    Assist    Wheelchair 50 feet with 2 turns activity did not occur: Safety/medical concerns       Wheelchair 150 feet activity     Assist Wheelchair 150 feet activity did not occur: Safety/medical concerns          Medical Problem List and Plan: 1.  Deficits with mobility, self-care, cognition secondary to R>L brain bleeds.  Continue CIR PT, OT, SLP Team conference today please see physician documentation under team conference tab, met with team  to discuss problems,progress, and goals. Formulized individual treatment plan based on medical history, underlying problem and comorbidities. 2.  PE/Antithrombotics: -anticoagulation:  Pharmaceutical: Other (comment)On Argatroban as pradaxa cannot be crushed.               -antiplatelet therapy: N/A 3. Heaches/Pain Management: On depakote tid for agitation as well as HA.    No c/os today              Monitor with increased emulation 4. Mood: LCSW to follow for evaluation and support.              -antipsychotic agents: N/A 5. Neuropsych: This patient is not capable of making decisions on her own behalf.  Telesitter for safety 6. Skin/Wound Care: Routine pressure relief measures.  7. Fluids/Electrolytes/Nutrition: Monitor I/Os.             Advance diet as tolerated 8. 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 Decadron tapered to 4 mg bid on 04/27--slow taper to off.   9. Polycythemia Vera: JAK2 mutation positive. S/p Leukapheresis X 3. Hydrea prn to keep HCT<45    HCT 27 10. HIT:  SRA negative but HIT screen at [facility] strongly positive.   Thrombocytopenia resolved  11. PAF/A fib with RVR:  Felt to be due to illness . Current recommendations are for [facility] for 6-9 months.   HR controlled 5/4             Monitor  with increased activity 12.  Steroid-induced hyperglycemia on prediabetes: Hgb A1c-6.1. Monitor BS every 4 hours with 5 units for coverage as well as SSI for elevated BS   CBG (last 3)  Recent Labs    03/14/20 1617 03/14/20 2123 03/15/20 0617  GLUCAP 140* 150* 172*               Monitor with increased mobility 13. Delirium:  With sundowning.Improving  On amantadine for activation and Depakote for agitation. Low dose Seroquel resumed 4/29 pm.  May need to schedule Seroquel of persistent sundowning  14. Anemia: Continue to monitor with daily labs.  Hemoglobin 8.6 5/4  Continue to monitor 15.  Post stroke dysphagia  Dysphagia 1, nectar liquids.   Continue tube feeds  Advance diet as tolerated 16.  Essential hypertension  Continue hydralazine 100 3 times daily  Continue losartan 50 twice daily   Vitals:   03/14/20 2028 03/15/20 0435  BP: 123/65 134/67  Pulse: 87 82  Resp: 17 18  Temp: 98.9 F (37.2 C) 97.7 F (36.5 C)  SpO2: 99% 98%   17.  Leukocytosis-likely steroid-induced- wean dexmethasone to 620m PO BID x 3 d, then plan for 131mBID x 3 d, then .20m73mID x 3d then stop   WBCs 12.4 on 5/4 trending down   Afebrile 18.  Severe hypoalbuminemia  Supplement initiated 19.  Peripheral edema  Lasix 20 daily started on 5/1, output not recorded, pt incont 20.  Elevated BUN  Encourage fluids Stable 5/1-5/2 21.  Urinary incont- per husband indicates need to void and generally voids 15-20 min later  22.  Anemia on anticoag will check stool guaic , per heme-onc starting PPI  LOS: 5 days A FACE TO FACSan Pierre[place] 03/15/2020, 8:55 AM

## 2020-03-15 NOTE — Progress Notes (Signed)
Physical Therapy Session Note  Patient Details  Name: [name] MRN: [id number] Date of Birth: 1945/12/09  Today's Date: 03/15/2020 PT Individual Time: 1415-1510 PT Individual Time Calculation (min): 55 min   Short Term Goals: Week 1:  PT Short Term Goal 1 (Week 1): Pt will ambulate 25' w/ min assist w/ LRAD PT Short Term Goal 2 (Week 1): Pt will initiate stair training PT Short Term Goal 3 (Week 1): Pt will initiate functional tasks w/ min cues 100% of the time PT Short Term Goal 4 (Week 1): Pt will maintain dynamic standing balance w/ min assist  Skilled Therapeutic Interventions/Progress Updates:   Pt received sitting in recliner and agreeable to PT, with significant effort from PT. Pt performed stand pivot transfer to [facility] with min assist and 1 UE support on IV pole.  Pt transported to rehab gym in [facility]. Dynamic balance training to perform horse shoe toss to target 2 x 10. Pt initially required mod, Hand over Hand assist to grasp horse shoe, but then able to continue with task throughout each bout, once demonstration given by PT. Pt performed gait training with 2 UE on IV pole x 50 ft and then additional 83f with HHA on the L and IV pole UE support on the R. Min assist throughout from Pt with max encouragement to continue. Mild reports of nausea at end of each bout of gait training, unable to attain VS. Sit<>supine from edge of mat table with CGA for safety, mild dizziness reported into supine and when returning to sitting. BLE therex to perform hip abduction, SLR, and heel slide; x 8 each BLE; AAROM throughout to encourage participation in task. Pt returned to room and performed ambulatory transfer to bed with MinA, HHA by PT. Sit>supine completed with supervision assist for safety, and left supine in bed with call bell in reach and all needs met.        Therapy Documentation Precautions:  Precautions Precautions: Fall Precaution Comments: cortrak Restrictions Weight Bearing  Restrictions: No    Vital Signs: Therapy Vitals Temp: 98.5 F (36.9 C) Temp Source: Oral Pulse Rate: 81 Resp: 17 BP: (!) 111/55 Patient Position (if appropriate): Lying Oxygen Therapy SpO2: 97 % O2 Device: Room Air Pain: Faces: no pain    Therapy/Group: Individual Therapy  ALorie Phenix5/03/2020, 3:41 PM

## 2020-03-15 NOTE — Progress Notes (Signed)
Social Work Patient ID: [name], female   DOB: 1945/12/07, 74 y.o.   MRN: [id number]  SW called spouse to provided team conference updates. No answer, left voicemail. SW will follow up with spouse tomorrow

## 2020-03-15 NOTE — Progress Notes (Signed)
Patient continue being impulsive, not aware for safety precautions when needs to urinate due to urgency. Nurse assisted patient to bedside commode several times and provided per care, but patient getting frustrated when nurse provided cues and instructions when transferring from bed to bedside commode.

## 2020-03-15 NOTE — Progress Notes (Signed)
Occupational Therapy Session Note  Patient Details  Name: [name] MRN: [id number] Date of Birth: 1946-05-06  Today's Date: 03/15/2020 OT Individual Time: 0705-0716 OT Individual Time Calculation (min): 11 min  and Today's Date: 03/15/2020 OT Missed Time:  50 minutes Missed Time Reason:  fatigue   Short Term Goals: Week 1:  OT Short Term Goal 1 (Week 1): Pt will don shirt wiht S OT Short Term Goal 2 (Week 1): Pt will complete oral care wiht setup OT Short Term Goal 3 (Week 1): Pt will thread BLE into pants OT Short Term Goal 4 (Week 1): Pt will complete 2/3 components of toileting with MIN A for standing balance  Skilled Therapeutic Interventions/Progress Updates:    Upon entering the room, pt sleeping soundly in bed. Multiple attempts made to wake pt from sleep but unsuccessful with pt not opening eyes at all and remaining asleep. OT notified RN who reports she has only had medication recently for nausea and emesis bag is in bed with patient. OT requesting scheduled sessions be after 8 am to increase patient participation. Bed alarm activated and call bell within reach.    Therapy Documentation Precautions:  Precautions Precautions: Fall Precaution Comments: cortrak Restrictions Weight Bearing Restrictions: No General:   Vital Signs: Therapy Vitals Temp: 97.7 F (36.5 C) Pulse Rate: 82 Resp: 18 BP: 134/67 Patient Position (if appropriate): Sitting Oxygen Therapy SpO2: 98 % Pain:   ADL: ADL Grooming: Minimal cueing Where Assessed-Grooming: Sitting at sink Upper Body Bathing: Moderate cueing Where Assessed-Upper Body Bathing: Sitting at sink Lower Body Bathing: Maximal assistance Where Assessed-Lower Body Bathing: Sitting at sink, Standing at sink Upper Body Dressing: Moderate assistance(for threadind d/t PICC and cortrak) Where Assessed-Upper Body Dressing: Sitting at sink Lower Body Dressing: Maximal assistance Where Assessed-Lower Body Dressing: Standing at  sink, Sitting at sink Toileting: Maximal assistance Where Assessed-Toileting: Bedside Commode Toilet Transfer: Moderate assistance Toilet Transfer Method: Stand pivot Toilet Transfer Equipment: Bedside commode   Therapy/Group: Individual Therapy  [name] 03/15/2020, 7:20 AM

## 2020-03-15 NOTE — Progress Notes (Signed)
[name]   DOB:[birth year]   YY#:[id number]   WUG#:[id number]  Subjective:  [name] is in bed NG in place.  She is alert, appears amused.  Husband and son are in the room.  The nurse also in the room was not able to tell me how much participation the patient has been doing on rehab but according to the family when the patient needs to go to the bathroom she is able to get out of bed and get to the bathroom with minimal assistance.  They have not noted any bleeding or bruising issues.  Objective: Middle-aged white woman examined in bed Vitals:   03/14/20 2028 03/15/20 0435  BP: 123/65 134/67  Pulse: 87 82  Resp: 17 18  Temp: 98.9 F (37.2 C) 97.7 F (36.5 C)  SpO2: 99% 98%    Body mass index is 28.06 kg/m.  Intake/Output Summary (Last 24 hours) at 03/15/2020 1305 Last data filed at 03/15/2020 8882 Gross per 24 hour  Intake 140 ml  Output --  Net 140 ml   Sclerae unicteric, EOMs intact Lungs no rales or rhonchi, auscultated anterolaterally Heart regular rate and rhythm Abd soft, nontender, positive bowel sounds Neuro: nonfocal; easily confused; knows son's name, was not sure of husband's name; when asked to raise her right arm she raised her left arm.  However when asked to raise both arms she was able to do that. Breasts: Deferred    Ref Range & Units 7 d ago 2 wk ago  Heparin Induced Plt Ab 0.000 - 0.400 OD 1.469High   2.553High  CM        CBG (last 3)  Recent Labs    03/14/20 2123 03/15/20 0617 03/15/20 1134  GLUCAP 150* 172* 129*     Labs:  Lab Results  Component Value Date   WBC 13.4 (H) 03/15/2020   HGB 8.9 (L) 03/15/2020   HCT 29.0 (L) 03/15/2020   MCV 102.1 (H) 03/15/2020   PLT 640 (H) 03/15/2020   NEUTROABS 11.4 (H) 03/09/2020    _0 @  Urine Studies No results for input(s): UHGB, CRYS in the last 72 hours.  Invalid input(s): UACOL, UAPR, USPG, UPH, UTP, UGL, UKET, UBIL, UNIT, UROB, ULEU, UEPI, UWBC, URBC, UBAC, CAST, [place],  [place]  Basic Metabolic Panel: Recent Labs  Lab 03/11/20 0451 03/11/20 0451 03/12/20 0536 03/12/20 0536 03/13/20 0726 03/13/20 0726 03/14/20 0530 03/15/20 0454  NA 137  --  136  --  135  --  135 133*  K 4.0   < > 4.3   < > 4.1   < > 4.2 4.3  CL 106  --  106  --  100  --  103 100  CO2 24  --  20*  --  24  --  22 23  GLUCOSE 139*  --  137*  --  125*  --  164* 196*  BUN 28*  --  25*  --  21  --  24* 24*  CREATININE 0.87  --  0.79  --  0.79  --  0.89 0.99  CALCIUM 8.7*  --  9.0  --  8.6*  --  8.9 9.3   < > = values in this interval not displayed.   GFR Estimated Creatinine Clearance: 46.3 mL/min (by C-G formula based on SCr of 0.99 mg/dL). Liver Function Tests: Recent Labs  Lab 03/10/20 0500 03/11/20 0451 03/14/20 0530  AST 36 22 22  ALT 54* 47* 40  ALKPHOS 124 78 85  BILITOT  0.6 0.6 0.4  PROT 4.8* 4.9* 5.1*  ALBUMIN 2.5* 2.5* 2.8*   No results for input(s): LIPASE, AMYLASE in the last 168 hours. No results for input(s): AMMONIA in the last 168 hours. Coagulation profile No results for input(s): INR, PROTIME in the last 168 hours.  CBC: Recent Labs  Lab 03/09/20 0500 03/10/20 0500 03/11/20 0451 03/12/20 0536 03/13/20 0726 03/14/20 0530 03/15/20 0454  WBC 13.1*   < > 13.1* 14.6* 11.6* 12.4* 13.4*  NEUTROABS 11.4*  --   --   --   --   --   --   HGB 7.7*   < > 7.8* 8.8* 7.8* 8.6* 8.9*  HCT 24.8*   < > 25.1* 28.9* 25.0* 27.2* 29.0*  MCV 99.2   < > 101.2* 103.6* 102.0* 100.4* 102.1*  PLT 223   < > 328 449* 445* 528* 640*   < > = values in this interval not displayed.   Cardiac Enzymes: No results for input(s): CKTOTAL, CKMB, CKMBINDEX, TROPONINI in the last 168 hours. BNP: Invalid input(s): POCBNP CBG: Recent Labs  Lab 03/14/20 1142 03/14/20 1617 03/14/20 2123 03/15/20 0617 03/15/20 1134  GLUCAP 113* 140* 150* 172* 129*   D-Dimer No results for input(s): DDIMER in the last 72 hours. Hgb A1c No results for input(s): HGBA1C in the last 72  hours. Lipid Profile Recent Labs    03/15/20 0454  TRIG 85   Thyroid function studies No results for input(s): TSH, T4TOTAL, T3FREE, THYROIDAB in the last 72 hours.  Invalid input(s): FREET3 Anemia work up Recent Labs    03/14/20 0530  RETICCTPCT 7.1*   Microbiology No results found for this or any previous visit (from the past 240 hour(s)).    Studies:  No results found.  Assessment/plan: 74 y.o. [place] woman presenting 02/13/2020 with headache, found to have biventricular bleeds in the setting of panmyelosis, subsequently with pulmonary emboli and sinus venous thromboses in the setting of heparin induced thrombocytopenia  (1) polycythemia vera  (a) JAK2 mutation positive confirming diagnosis  (b) bone marrow biopsy 02/13/2020 shows no evidence of leukemia, c/w P Vera  (c)  leukapheresis x3 . Last leukapheresis Friday 02/18/2020   (i) HD catheter removed after pheresis on  02/18/2020  (d) hydrea as needed to keep HCT <45 and platelets in normal range   (2) heparin induced thrombocytopenia: positive HIT screen x2  (a) severe thrombocythemia noted 02/24/2020  (b) bilateral pulmonary emboli 02/22/2020   (i) s/p IVC filter placement 02/22/2020   (ii) atrial fibrillation with RVR  (c) venous sinus thromboses 02/24/2020   (i) s/p mechanical thrombectomy  (d) argatroban started 02/25/2020  (e) platelet count responded to heparin withdrawal  (f) confirmatory serotonin release assay negative (see discussion below)   (i) continue argatroban for anticoagulation, no heparin   (ii) repeat HIT screen at [facility] lab again strongly positive  (g) platelets recovered as of 03/08/2020  (3) drug-induced thrombocytopenia  (a) discontinued amiodarone, inessential meds   (i) remains in sinus  (b) thrombocytopenia resolved-- platelets now elevated  (4) anemia: reticulocytes increased; no folate, B-12 or iron deficiency; normal renal function; LDH slightly elevated but negative  DAT and normal t bil (no evidence of hemolysis)--likely patient has occult bleeding--on protonix  Plan: [name] continues to make progress.  She now follows commands and is oriented at least to person.  She is alert and appears comfortable.  Hopefully she will be able to participate fully in the rehab program and be fully ambulatory when discharge  occurs.  I am going to start her on a low-dose of Hydrea to make sure the platelets do not continue to climb.  I am going to continue the argatroban for now.  I would like to remove the IVC filter, since the patient is now anticoagulated, and it would be easier to stop and start the infusion then oral medications.  We will continue to follow with you  Chauncey Cruel, MD 03/15/2020  1:05 PM Medical Oncology and Hematology [facility] [address] [place], [place] [address] Tel. [phone]    Fax. [phone]

## 2020-03-16 ENCOUNTER — Encounter (HOSPITAL_COMMUNITY): Payer: Medicare HMO

## 2020-03-16 ENCOUNTER — Inpatient Hospital Stay (HOSPITAL_COMMUNITY): Payer: Medicare HMO | Admitting: Physical Therapy

## 2020-03-16 ENCOUNTER — Telehealth: Payer: Self-pay | Admitting: *Deleted

## 2020-03-16 ENCOUNTER — Inpatient Hospital Stay (HOSPITAL_COMMUNITY): Payer: Medicare HMO | Admitting: Occupational Therapy

## 2020-03-16 LAB — APTT
aPTT: 56 seconds — ABNORMAL HIGH (ref 24–36)
aPTT: 59 seconds — ABNORMAL HIGH (ref 24–36)

## 2020-03-16 LAB — CBC
HCT: 28 % — ABNORMAL LOW (ref 36.0–46.0)
Hemoglobin: 8.6 g/dL — ABNORMAL LOW (ref 12.0–15.0)
MCH: 31.6 pg (ref 26.0–34.0)
MCHC: 30.7 g/dL (ref 30.0–36.0)
MCV: 102.9 fL — ABNORMAL HIGH (ref 80.0–100.0)
Platelets: 516 10*3/uL — ABNORMAL HIGH (ref 150–400)
RBC: 2.72 MIL/uL — ABNORMAL LOW (ref 3.87–5.11)
RDW: 20.7 % — ABNORMAL HIGH (ref 11.5–15.5)
WBC: 10.1 10*3/uL (ref 4.0–10.5)
nRBC: 0 % (ref 0.0–0.2)

## 2020-03-16 LAB — BASIC METABOLIC PANEL
Anion gap: 7 (ref 5–15)
BUN: 21 mg/dL (ref 8–23)
CO2: 25 mmol/L (ref 22–32)
Calcium: 8.8 mg/dL — ABNORMAL LOW (ref 8.9–10.3)
Chloride: 102 mmol/L (ref 98–111)
Creatinine, Ser: 0.87 mg/dL (ref 0.44–1.00)
GFR calc Af Amer: >60 mL/min (ref >60)
GFR calc non Af Amer: >60 mL/min (ref >60)
Glucose, Bld: 143 mg/dL — ABNORMAL HIGH (ref 70–99)
Potassium: 4.2 mmol/L (ref 3.5–5.1)
Sodium: 134 mmol/L — ABNORMAL LOW (ref 135–145)

## 2020-03-16 LAB — GLUCOSE, CAPILLARY
Glucose-Capillary: 132 mg/dL — ABNORMAL HIGH (ref 70–99)
Glucose-Capillary: 108 mg/dL — ABNORMAL HIGH (ref 70–99)
Glucose-Capillary: 169 mg/dL — ABNORMAL HIGH (ref 70–99)
Glucose-Capillary: 123 mg/dL — ABNORMAL HIGH (ref 70–99)

## 2020-03-16 LAB — OCCULT BLOOD X 1 CARD TO LAB, STOOL: Fecal Occult Bld: NEGATIVE

## 2020-03-16 NOTE — Progress Notes (Signed)
Calorie Count Note: Day 2  72-hour calorie count ordered. Calorie count started on 03/14/20 at lunch meal.  Diet: Dysphagia 2 with nectar-thick liquids (however, currently NPO for possible procedure) Supplements:  - Magic cup TID with meals, each supplement provides 290 kcal and 9 grams of protein  Day 2 03/15/20 Lunch: no information available 03/15/20 Dinner: 134 kcal, 5 grams protein 03/16/20 Breakfast: 0 kcal, 0 grams protein (pt NPO for possible procedure) Supplements: 0 kcal, 0 grams protein  Total 24-hour PO intake: 134 kcal (8% of minimum estimated needs)  5 grams protein (6% of minimum estimated needs)  Nutrition Diagnosis: Inadequate oral intakerelated to lethargy/confusion, dysphagiaas evidenced by meal completion < 50%.  Goal: Patient will meet greater than or equal to 90% of their needs  Intervention: - Continue 72-hour calorie count - Continue Magic Cup TID with meals - Continue nocturnal tube feeds of Glucerna 1.2 @ 70 ml/hr for 10 hours from 1900 to 0500 with Pro-stat 30 ml daily   Gaynell Face, MS, RD, LDN Inpatient Clinical Dietitian Pager: [phone] Weekend/After Hours: [phone]

## 2020-03-16 NOTE — Telephone Encounter (Signed)
Husband left VM asking for Dr. Virgie Dad nurse to call him regarding his wife in the hospital. He has questions about insurance. Forwarded message to Dr. Virgie Dad nurse.

## 2020-03-16 NOTE — Progress Notes (Signed)
ANTICOAGULATION CONSULT NOTE - Follow-Up  Pharmacy Consult for Argatroban Indication: HIT,  Bilateral PE  Allergies  Allergen Reactions  . Erythromycin Itching and Other (See Comments)    Severe stomach pains, diarrhea  . Heparin     Likely HIT, SRA negative    Patient Measurements: Weight: 70.2 kg (154 lb 12.2 oz)  Vital Signs: Temp: 97.7 F (36.5 C) (05/06 1447) BP: 126/70 (05/06 1447) Pulse Rate: 88 (05/06 1447)  Labs: Recent Labs    03/14/20 0530 03/14/20 1643 03/15/20 0454 03/15/20 0454 03/15/20 1632 03/16/20 0529 03/16/20 1648  HGB 8.6*  --  8.9*  --   --  8.6*  --   HCT 27.2*  --  29.0*  --   --  28.0*  --   PLT 528*  --  640*  --   --  516*  --   APTT 49*   < > 58*   < > 56* 56* 59*  CREATININE 0.89  --  0.99  --   --  0.87  --    < > = values in this interval not displayed.    Estimated Creatinine Clearance: 52.8 mL/min (by C-G formula based on SCr of 0.87 mg/dL).  Assessment: 74 year old initially admitted 4/4 with acute IVH and thrombocythemia with platelet count of 990K. Patient was ultimately diagnosed with polythemia vera, now is S/P leukopheresis, therapeutic phlebotomy, and initiation of hydroxyurea. On 4/13, pt was found to have bilateral PE - risk of AC was judged too great and IVC was placed. CT scan 4/15 showed extensive thrombus within confluence of sinuses; pt was sent to IR for mechanical thrombectomy X 5.  HIT antibody sent 4/15 was found to be positive; SRA negative on 4/17. Per Hematology, continuing on DTI for now, given slight risk of false negative, with plans for DOAC once tolerating POs better. Planned to remove IVC by IR today, but earlier scans indicated extensive IVC clot and clot in filter itself, so IVC not able to be removed today; plan is to continue anticoagulation, then repeat scans in 3 mons, by which time hopefully clot may have cleared enough so that the IVC filter can be removed.  Thrombocytopenia has now resolved (plt 516  today), and hydroxyurea has been resumed. Hgb stable, FOBT negative 5/6.  aPTT this afternoon on argatroban infusion at 0.75 mcg/kg/min was 59 sec, which remains within the goal range for this pt (lower goal of 50-60 sec, due to high risk for bleeding complications with IVH). Per RN, no issues with IV or bleeding observed.  Goal of Therapy:  aPTT 50 - 60 seconds Monitor platelets by anticoagulation protocol: Yes   Plan: Continue argatroban at 0.75 mcg/kg/min Continue q12h aPTT checks Monitor daily CBC, s/sx bleeding Follow-up plans to swtich to oral anticoagulation  Gillermina Hu, PharmD, BCPS, [facility] Clinical Pharmacist 03/16/2020 6:24 PM

## 2020-03-16 NOTE — Progress Notes (Signed)
[facility] PHYSICAL MEDICINE & REHABILITATION PROGRESS NOTE  Subjective/Complaints:  Appreciate hematology note.  N.p.o. scheduled for IVC filter removal. ROS: Unable to assess due to cognition  Objective: Vital Signs: Blood pressure 136/77, pulse 84, temperature 98.2 F (36.8 C), resp. rate 17, weight 70.2 kg, SpO2 97 %. No results found. Recent Labs    03/15/20 0454 03/16/20 0529  WBC 13.4* 10.1  HGB 8.9* 8.6*  HCT 29.0* 28.0*  PLT 640* 516*   Recent Labs    03/15/20 0454 03/16/20 0529  NA 133* 134*  K 4.3 4.2  CL 100 102  CO2 23 25  GLUCOSE 196* 143*  BUN 24* 21  CREATININE 0.99 0.87  CALCIUM 9.3 8.8*    Physical Exam: BP 136/77   Pulse 84   Temp 98.2 F (36.8 C)   Resp 17   Wt 70.2 kg   SpO2 97%   BMI 28.31 kg/m      General: No acute distress Mood and affect are appropriate Heart: Regular rate and rhythm no rubs murmurs or extra sounds Lungs: Clear to auscultation, breathing unlabored, no rales or wheezes Abdomen: Positive bowel sounds, soft nontender to palpation, nondistended Extremities: No clubbing, cyanosis, or edema Skin: No evidence of breakdown, no evidence of rash Neurologic: Cranial nerves II through XII intact, motor strength is 5/5 in bilateral deltoid, bicep, tricep, grip, hip flexor, knee extensors, ankle dorsiflexor and plantar flexor   Musculoskeletal: Full range of motion in all 4 extremities. No joint swelling   Assessment/Plan: 1. Functional deficits secondary to R>L brain bleeds which require 3+ hours per day of interdisciplinary therapy in a comprehensive inpatient rehab setting.  Physiatrist is providing close team supervision and 24 hour management of active medical problems listed below.  Physiatrist and rehab team continue to assess barriers to discharge/monitor patient progress toward functional and medical goals  Care Tool:  Bathing    Body parts bathed by patient: Left arm, Chest, Abdomen, Front perineal area,  Right upper leg, Left upper leg, Face, Right lower leg, Right arm, Left lower leg   Body parts bathed by helper: Buttocks     Bathing assist Assist Level: Minimal Assistance - Patient > 75%     Upper Body Dressing/Undressing Upper body dressing   What is the patient wearing?: Pull over shirt    Upper body assist Assist Level: Minimal Assistance - Patient > 75%    Lower Body Dressing/Undressing Lower body dressing      What is the patient wearing?: Incontinence brief, Pants     Lower body assist Assist for lower body dressing: Moderate Assistance - Patient 50 - 74%     Toileting Toileting    Toileting assist Assist for toileting: Moderate Assistance - Patient 50 - 74%     Transfers Chair/bed transfer  Transfers assist     Chair/bed transfer assist level: Minimal Assistance - Patient > 75%     Locomotion Ambulation   Ambulation assist      Assist level: Moderate Assistance - Patient 50 - 74% Assistive device: Hand held assist Max distance: 15'   Walk 10 feet activity   Assist     Assist level: Moderate Assistance - Patient - 50 - 74% Assistive device: Hand held assist   Walk 50 feet activity   Assist Walk 50 feet with 2 turns activity did not occur: Safety/medical concerns         Walk 150 feet activity   Assist Walk 150 feet activity did not occur: Safety/medical  concerns         Walk 10 feet on uneven surface  activity   Assist Walk 10 feet on uneven surfaces activity did not occur: Safety/medical concerns         Wheelchair     Assist Will patient use wheelchair at discharge?: No(No PT LTG set)   Wheelchair activity did not occur: Safety/medical concerns         Wheelchair 50 feet with 2 turns activity    Assist    Wheelchair 50 feet with 2 turns activity did not occur: Safety/medical concerns       Wheelchair 150 feet activity     Assist Wheelchair 150 feet activity did not occur: Safety/medical  concerns          Medical Problem List and Plan: 1.  Deficits with mobility, self-care, cognition secondary to R>L brain bleeds.  Mainly cognitive dysfunction more so than weakness.  Also severe dysphagia  Continue CIR PT, OT, SLP  2.  PE/Antithrombotics: IVC to be retrieved today in radiology department -anticoagulation:  Pharmaceutical: Other (comment)On Argatroban as pradaxa cannot be crushed.  Should be able to swallow pills whole now              -antiplatelet therapy: N/A 3. Heaches/Pain Management: On depakote tid for agitation as well as HA.     No c/os today              Monitor with increased emulation 4. Mood: LCSW to follow for evaluation and support.              -antipsychotic agents: N/A 5. Neuropsych: This patient is not capable of making decisions on her own behalf.  Telesitter for safety 6. Skin/Wound Care: Routine pressure relief measures.  7. Fluids/Electrolytes/Nutrition: Monitor I/Os.             Advance diet as tolerated 8. 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 Decadron tapered to 4 mg bid on 04/27--slow taper to off.   9. Polycythemia Vera: JAK2 mutation positive. S/p Leukapheresis X 3. Hydrea prn to keep HCT<45    HCT 27 10. HIT:  SRA negative but HIT screen at [facility] strongly positive.   Thrombocytopenia resolved  11. PAF/A fib with RVR:  Felt to be due to illness . Current recommendations are for [facility] for 6-9 months.   HR controlled 5/6             Monitor with increased activity 12.  Steroid-induced hyperglycemia on prediabetes: Hgb A1c-6.1. Monitor BS every 4 hours with 5 units for coverage as well as SSI for elevated BS   CBG (last 3)  Recent Labs    03/15/20 1714 03/15/20 2059 03/16/20 0559  GLUCAP 119* 142* 132*               Monitor with increased mobility 13. Delirium:  With sundowning.Improving  On amantadine for activation and Depakote for  agitation. Low dose Seroquel prn, none taken for >3d  No agitation 14. Anemia: Continue to monitor with daily labs.   Hemoglobin 8.6 5/4- stool guaic pending  Continue to monitor 15.  Post stroke dysphagia  Dysphagia 1, nectar liquids.   Continue tube feeds  Advance diet as tolerated 16.  Essential hypertension  Continue hydralazine 100 3 times daily  Continue losartan 50 twice daily   Vitals:   03/15/20 1956 03/16/20 0250  BP: 125/79 136/77  Pulse: 90 84  Resp: 18 17  Temp: 97.9 F (36.6 C) 98.2 F (36.8 C)  SpO2: 96% 97%   17.  Leukocytosis resolved -likely steroid-induced- weaning dexmethasone to 2mg  PO BID x 3 d, then plan for 1mg  BID x 3 d, then .5mg  BID x 3d then stop    Afebrile 18.  Severe hypoalbuminemia  Supplement initiated 19.  Peripheral edema  Lasix 20 daily started on 5/1, output not recorded, pt incont 20.  Elevated BUN  Encourage fluids Stable 5/1-5/2 21.  Urinary incont- per husband indicates need to void and generally voids 15-20 min later  22.  Anemia on anticoag negative stool guaic x1, per heme-onc starting PPI  LOS: 6 days A FACE TO [place] E Korin Setzler 03/16/2020, 8:36 AM

## 2020-03-16 NOTE — Progress Notes (Signed)
Occupational Therapy Session Note  Patient Details  Name: [name] MRN: [id number] Date of Birth: 1945-12-10  Today's Date: 03/16/2020 OT Individual Time: 1300-1410 OT Individual Time Calculation (min): 70 min    Short Term Goals: Week 1:  OT Short Term Goal 1 (Week 1): Pt will don shirt wiht S OT Short Term Goal 2 (Week 1): Pt will complete oral care wiht setup OT Short Term Goal 3 (Week 1): Pt will thread BLE into pants OT Short Term Goal 4 (Week 1): Pt will complete 2/3 components of toileting with MIN A for standing balance  Skilled Therapeutic Interventions/Progress Updates:    Upon entering the room, pt supine in bed and agreeable to OT intervention. Supine >sit with min guard to EOB. Pt reports "No" when asked if needing to use bathroom but OT set up [facility] and pt transferred with min A and appeared to urgently need to use bathroom. Pt able to manage clothing and hygiene this session with min A. Pt transferred into wheelchair for hand hygiene at sink with min cuing for sequencing and initiation. OT presented pt with toothbrush with toothpaste already applied and pt took in hand but did not initiate brushing teeth. OT attempted hand over hand assist to bring to mouth but pt wiped paste on L hand and then applied to face like face cream. OT providing assistance to clean from face. Pt presented with other familiar items like comb and asked what it was ( no response) and to use it (she just looked at comb). OT assisted pt out of room and into day room with moderate external distractions. Pt presented with peg board. She was unable to name colors of pegs even when given choice on two. She was able to group several pegs by color. Pt needing max multimodal cuing to place pegs into board. OT assisted pt back to room and presented with meal tray with set up A this session. Pt feeding self ~ 30% and then verbalized, " Very good but too much". Pt declined further food this session and was assisted  back into bed with min A stand pivot transfer. Bed alarm activated and call bell within reach. Pt asleep as therapist exits the room.   Therapy Documentation Precautions:  Precautions Precautions: Fall Precaution Comments: cortrak Restrictions Weight Bearing Restrictions: No General:   Vital Signs: Therapy Vitals Temp: 97.7 F (36.5 C) Pulse Rate: 88 Resp: 17 BP: 126/70 Patient Position (if appropriate): Sitting Oxygen Therapy SpO2: 98 % O2 Device: Room Air Pain:   ADL: ADL Grooming: Minimal cueing Where Assessed-Grooming: Sitting at sink Upper Body Bathing: Moderate cueing Where Assessed-Upper Body Bathing: Sitting at sink Lower Body Bathing: Maximal assistance Where Assessed-Lower Body Bathing: Sitting at sink, Standing at sink Upper Body Dressing: Moderate assistance(for threadind d/t PICC and cortrak) Where Assessed-Upper Body Dressing: Sitting at sink Lower Body Dressing: Maximal assistance Where Assessed-Lower Body Dressing: Standing at sink, Sitting at sink Toileting: Maximal assistance Where Assessed-Toileting: Bedside Commode Toilet Transfer: Moderate assistance Toilet Transfer Method: Stand pivot Toilet Transfer Equipment: Bedside commode   Therapy/Group: Individual Therapy  [name] 03/16/2020, 4:51 PM

## 2020-03-16 NOTE — Progress Notes (Signed)
ANTICOAGULATION CONSULT NOTE - Follow-Up  Pharmacy Consult for Argatroban Indication: HIT,  Bilateral PE  Allergies  Allergen Reactions  . Erythromycin Itching and Other (See Comments)    Severe stomach pains, diarrhea  . Heparin     Likely HIT, SRA negative    Patient Measurements: Weight: 70.2 kg (154 lb 12.2 oz)  Vital Signs: Temp: 98.2 F (36.8 C) (05/06 0250) BP: 136/77 (05/06 0250) Pulse Rate: 84 (05/06 0250)  Labs: Recent Labs    03/14/20 0530 03/14/20 1643 03/15/20 0454 03/15/20 1632 03/16/20 0529  HGB 8.6*  --  8.9*  --  8.6*  HCT 27.2*  --  29.0*  --  28.0*  PLT 528*  --  640*  --  516*  APTT 49*   < > 58* 56* 56*  CREATININE 0.89  --  0.99  --  0.87   < > = values in this interval not displayed.    Estimated Creatinine Clearance: 52.8 mL/min (by C-G formula based on SCr of 0.87 mg/dL).  Assessment: 74 year old initially admitted 4/4 with acute IVH and thrombocythemia with PLTC of 990k. Patient was ultimately diagnosed with polythemia vera, now s/p leukopheresis, therapeutic phlebotomy, and initiation of hydroxyurea. On 4/13 - found to have B/L PE - decision was risk of AC was too great and IVC placed. CT scan 4/15 showed extensive thrombus within confluence of sinuses and sent to IR with mechanical thrombectomy x5.  HIT antibody sent 4/15 and found to be positive, SRA negative 4/17. Per hematology continuing on DTI for now given slight risk of false negative with plans for DOAC once tolerating POs better.  Thrombocytopenia has now resolved, hydroxyurea resumed. Hgb stable, FOBT negative 5/6. APTT remains therapeutic at 56 seconds.   Goal of Therapy:  aPTT 50 - 60 seconds Monitor platelets by anticoagulation protocol: Yes   Plan: -Continue argatroban to 0.75 mcg/kg/min (3.6 ml/hr)  -Continue q12h aPTT checks -Monitor daily CBC, s/sx bleeding -Follow-up plans switching to oral anticoagulation   Arrie Senate, PharmD, BCPS Clinical  Pharmacist [phone] Please check AMION for all [place] numbers 03/16/2020

## 2020-03-16 NOTE — Progress Notes (Signed)
Patient PRN Dulcolax suppository for constipation is effective. Patient had a medium hard sausage brown stools. Stool sample collected for occult blood test and sent to lab.

## 2020-03-16 NOTE — Progress Notes (Signed)
I wrote an order for IVC filter retrieval. However Dr Laurence Ferrari read the pt's earlier scans and found she had extensive IVC clot and clot in the filter itself. This was not previously noted. We cannot remove filter at present. Plan is to continue anticoagulation (IV until po intake secure) then repeat scans in three months, by which time hopefully clot may have cleared enough filter could be removed.

## 2020-03-16 NOTE — Progress Notes (Signed)
Patient sleep well all night provided with toileting assistance as needed.Tube feeding stopped/on hold started at midnight d/t NPO order, AM medications administered with sips of nectar thick water only. PRN dulcolax suppository administered this morning as well for constipation, result still pending. Will continue to monitor patient and assist for toileting needs.

## 2020-03-16 NOTE — Progress Notes (Signed)
Speech Language Pathology Daily Session Note  Patient Details  Name: INESSA TEPE MRN: [id number] Date of Birth: 1946/08/08  Today's Date: 03/16/2020 SLP Individual Time: [id number] SLP Individual Time Calculation (min): 56 min  Short Term Goals: Week 1: SLP Short Term Goal 1 (Week 1): Patient will tolerate PO trials of thin liquids with no overt s/sx of aspiration. SLP Short Term Goal 2 (Week 1): Patient will consume PO trials of dysphagia 2 textures with adequate mastication and oral clearance with no overt s/sx of aspiration. SLP Short Term Goal 3 (Week 1): Patient will consume current pureed diet and nectar thick liquids with no overt s/sx of aspriation. SLP Short Term Goal 4 (Week 1): Patient will answer conrete yes/no questions with 80% accuracy and max cues. SLP Short Term Goal 5 (Week 1): Patient will follow 2 step directions with 80% accuracy and max cues. SLP Short Term Goal 6 (Week 1): Patient will name simple/fucntional objects with 80% accuracy and max cues.  Skilled Therapeutic Interventions: Skilled ST services focused on language skills and education. Pt's husband and son present for treatment session, focusing on cuing strategies for expressive/receptive language. SLP was unable to educate on swallow strategies due to pt being NPO for procedure. SLP facilitated expressive language skills in naming common objects in 0 out 5 trials with max A semantic, binary choice and repetition cues, nor was pt able to consistently identify objects with yes/no response given binary choice questions in 0 out 3 trials. Pt demonstrated automatic language counting 1-10 with verbal cues fading to visional cues only over 3 trials and naming 4 out 7 days of the week in unison over 2 trials. Pt independently expressed x3 functional phrases within context " I feel queasy" and demonstrated awareness of verbal errors x2. SLP also facilitated receptive language skills in identifying objects from a field of 2  in 4 out 7 trials and matched object to picture from a field of 2 in 3 out 4 trials with max A semantic and object function cues for both receptive tasks. SLP provided education pertaining to cuing strategies to increase automatic expressive language, awareness of verbal errors and comprehension with increase visual/context cues and using simple questions. Pt's husband and son demonstrated understanding in utilizing strategies during the treatment session. SLP recommends to continue education. Pt was left in room with family, call bell within reach and bed alarm set. ST recommends to continue skilled ST services     Pain Pain Assessment Pain Score: 0-No pain  Therapy/Group: Individual Therapy  Alexandro Line  [facility] 03/16/2020, 9:47 AM

## 2020-03-16 NOTE — Progress Notes (Signed)
Referring Physician(s): Dr. Jana Hakim  Supervising Physician: Jacqulynn Cadet  Patient Status:  [name]  Chief Complaint: Bilateral DVT, caval clot, IVC filter placement   Subjective: Sleepy. NGT in place.  No complaints of leg pain or swelling.   Allergies: Erythromycin and Heparin  Medications: Prior to Admission medications   Medication Sig Start Date End Date Taking? Authorizing Provider  amantadine (SYMMETREL) 50 MG/5ML solution Place 10 mLs (100 mg total) into feeding tube 2 (two) times daily. 03/10/20   Bhuvaneswaran, Anitha, PA-C  Amino Acids-Protein Hydrolys (FEEDING SUPPLEMENT, PRO-STAT SUGAR FREE 64,) LIQD Place 30 mLs into feeding tube daily. 03/11/20   Bhuvaneswaran, Anitha, PA-C  amLODipine (NORVASC) 10 MG tablet Place 1 tablet (10 mg total) into feeding tube daily. 03/11/20   Posey Pronto, PA-C  argatroban 1 mg/mL SOLN Inject 51.805 mcg/min into the vein continuous. 03/10/20   Bhuvaneswaran, Anitha, PA-C  atorvastatin (LIPITOR) 10 MG tablet Place 1 tablet (10 mg total) into feeding tube daily. 03/11/20   Bhuvaneswaran, Anitha, PA-C  dexamethasone (DECADRON) 4 MG/ML injection Inject 1 mL (4 mg total) into the vein every 12 (twelve) hours. 03/10/20   Bhuvaneswaran, Anitha, PA-C  hydrALAZINE (APRESOLINE) 100 MG tablet Place 1 tablet (100 mg total) into feeding tube 3 (three) times daily. 03/10/20   Bhuvaneswaran, Anitha, PA-C  insulin aspart (NOVOLOG) 100 UNIT/ML injection Inject 0-20 Units into the skin every 4 (four) hours. 03/10/20   Bhuvaneswaran, Anitha, PA-C  insulin aspart (NOVOLOG) 100 UNIT/ML injection Inject 5 Units into the skin every 4 (four) hours. 03/10/20   Posey Pronto, PA-C  levothyroxine (SYNTHROID) 125 MCG tablet Place 1 tablet (125 mcg total) into feeding tube daily before breakfast. 03/11/20   Bhuvaneswaran, Anitha, PA-C  losartan (COZAAR) 50 MG tablet Place 1 tablet (50 mg total) into feeding tube 2 (two) times daily. 03/10/20    Bhuvaneswaran, Grier Rocher, PA-C  Nutritional Supplements (FEEDING SUPPLEMENT, GLUCERNA 1.2 CAL,) LIQD Place 1,000 mLs into feeding tube continuous. 03/10/20   Bhuvaneswaran, Anitha, PA-C  QUEtiapine (SEROQUEL) 25 MG tablet Place 1 tablet (25 mg total) into feeding tube at bedtime as needed (for agitation). 03/10/20   Posey Pronto, PA-C  valproic acid (DEPAKENE) 250 MG/5ML solution Place 5 mLs (250 mg total) into feeding tube 2 (two) times daily. 03/10/20   Posey Pronto, PA-C     Vital Signs: BP 136/77   Pulse 84   Temp 98.2 F (36.8 C)   Resp 17   Wt 154 lb 12.2 oz (70.2 kg)   SpO2 97%   BMI 28.31 kg/m   Physical Exam  NAD, resting comfortably, sleepy MSK: bilateral legs without swelling or warmth, ROM intact Pulses: intact DPs 2+  Imaging: No results found.  Labs:  CBC: Recent Labs    03/13/20 0726 03/14/20 0530 03/15/20 0454 03/16/20 0529  WBC 11.6* 12.4* 13.4* 10.1  HGB 7.8* 8.6* 8.9* 8.6*  HCT 25.0* 27.2* 29.0* 28.0*  PLT 445* 528* 640* 516*    COAGS: Recent Labs    02/14/20 0028 02/14/20 0028 02/24/20 1450 02/25/20 1853 03/14/20 1643 03/15/20 0454 03/15/20 1632 03/16/20 0529  INR 1.1  --  1.3*  --   --   --   --   --   APTT 40*   < > 42*   < > 54* 58* 56* 56*   < > = values in this interval not displayed.    BMP: Recent Labs    03/13/20 0726 03/14/20 0530 03/15/20 0454 03/16/20 0529  NA  135 135 133* 134*  K 4.1 4.2 4.3 4.2  CL 100 103 100 102  CO2 24 22 23 25   GLUCOSE 125* 164* 196* 143*  BUN 21 24* 24* 21  CALCIUM 8.6* 8.9 9.3 8.8*  CREATININE 0.79 0.89 0.99 0.87  GFRNONAA >60 >60 57* >60  GFRAA >60 >60 >60 >60    LIVER FUNCTION TESTS: Recent Labs    02/29/20 0506 03/10/20 0500 03/11/20 0451 03/14/20 0530  BILITOT 0.5 0.6 0.6 0.4  AST 26 36 22 22  ALT 46* 54* 47* 40  ALKPHOS 97 124 78 85  PROT 5.4* 4.8* 4.9* 5.1*  ALBUMIN 2.9* 2.5* 2.5* 2.8*    Assessment and Plan: IR contacted by Dr. Jana Hakim re: IVC filter  originally placed 02/22/20 by Dr. Anselm Pancoast.  IVC filter was placed due to patient with bilateral pulmonary emboli likely related to polycythemia vera; patient was not a candidate for anticoagulation at the time due to an intraventricular hemorrhage.  She has now been safely transitioned to oral AC, and request was made to consider removal ahead of planned follow-up 8-12 weeks post placement.  Imaging reviewed by Dr. Laurence Ferrari who notes the presence of caval clot and bilateral DVTs.  Of note, her LE dopplers were negative 02/23/20.  Clot then demonstrated on CT CAP 03/03/20.   Dr. Laurence Ferrari has discussed with Dr. Jana Hakim.   Plan for repeat CT venogram in 3 months then reassess for filter retrieval.   Schedulers made aware to arrange appointment.  Patient's husband informed.   Electronically Signed: Docia Barrier, PA 03/16/2020, 10:50 AM   I spent a total of 15 Minutes at the the patient's bedside AND on the patient's hospital floor or unit, greater than 50% of which was counseling/coordinating care for bilateral DVT, pulmonary embolus, IVC filter.

## 2020-03-16 NOTE — Progress Notes (Addendum)
Physical Therapy Session Note  Patient Details  Name: [name] MRN: [id number] Date of Birth: 1946-05-18  Today's Date: 03/16/2020 PT Individual Time: 1100-1145 PT Individual Time Calculation (min): 45 min   Short Term Goals: Week 1:  PT Short Term Goal 1 (Week 1): Pt will ambulate 25' w/ min assist w/ LRAD PT Short Term Goal 2 (Week 1): Pt will initiate stair training PT Short Term Goal 3 (Week 1): Pt will initiate functional tasks w/ min cues 100% of the time PT Short Term Goal 4 (Week 1): Pt will maintain dynamic standing balance w/ min assist  Skilled Therapeutic Interventions/Progress Updates:   Pt received supine in bed and agreeable to PT. Supine>sit transfer with supervision and increased time. With significant encouragement pt agreeable to transfer to [facility]. PT assisted pt to don pants EOB. Sit<>stand with CGA to pull pants to waist. Stand pivot transfer to [facility] with min assist and HHA on the R. Pt transported to day room. Sit<>stand at high ow table with CGA to engage in fine motor task. Once in standing, pt reports need to urinate. Returned to room in [facility]. Ambulatory transfer to toilet with IV pole and HHA and min assist. Pt able to void urine with  With supervision assist for safety. Pt transferred back to [facility] with min assist as listed above. Transported back to day room. Dynamic standing balance at hight low table with supervision-CGA for safety while engaged in fine motor task of rock painting. Pt able to attend to task and maintain standing ~ 5 minutes with encouragement from PT. Pt performed Gait training with UE support on IV pole and HHA from PT x 60 ft. Pt becoming increasing internally distracted throughout session, requesting to lie down repeatedly. Pt returned to room and performed stand pivot transfer to bed with min assist. Sit>supine completed with supervision assist, and left supine in bed with call bell in reach and all needs met.        Therapy  Documentation Precautions:  Precautions Precautions: Fall Precaution Comments: cortrak Restrictions Weight Bearing Restrictions: No General: PT Amount of Missed Time (min): 15 Minutes PT Missed Treatment Reason: Patient unwilling to participate;Patient fatigue    Pain: Pain Assessment Pain Scale: 0-10 Pain Score: 0-No pain    Therapy/Group: Individual Therapy  Lorie Phenix 03/16/2020, 12:31 PM

## 2020-03-17 ENCOUNTER — Inpatient Hospital Stay (HOSPITAL_COMMUNITY): Payer: Medicare HMO | Admitting: Physical Therapy

## 2020-03-17 ENCOUNTER — Inpatient Hospital Stay (HOSPITAL_COMMUNITY): Payer: Medicare HMO | Admitting: Occupational Therapy

## 2020-03-17 ENCOUNTER — Inpatient Hospital Stay (HOSPITAL_COMMUNITY): Payer: Medicare HMO

## 2020-03-17 LAB — CBC
HCT: 28 % — ABNORMAL LOW (ref 36.0–46.0)
Hemoglobin: 8.6 g/dL — ABNORMAL LOW (ref 12.0–15.0)
MCH: 31.7 pg (ref 26.0–34.0)
MCHC: 30.7 g/dL (ref 30.0–36.0)
MCV: 103.3 fL — ABNORMAL HIGH (ref 80.0–100.0)
Platelets: 538 10*3/uL — ABNORMAL HIGH (ref 150–400)
RBC: 2.71 MIL/uL — ABNORMAL LOW (ref 3.87–5.11)
RDW: 20.8 % — ABNORMAL HIGH (ref 11.5–15.5)
WBC: 8.4 10*3/uL (ref 4.0–10.5)
nRBC: 0 % (ref 0.0–0.2)

## 2020-03-17 LAB — GLUCOSE, CAPILLARY
Glucose-Capillary: 138 mg/dL — ABNORMAL HIGH (ref 70–99)
Glucose-Capillary: 176 mg/dL — ABNORMAL HIGH (ref 70–99)
Glucose-Capillary: 126 mg/dL — ABNORMAL HIGH (ref 70–99)
Glucose-Capillary: 79 mg/dL (ref 70–99)

## 2020-03-17 LAB — BASIC METABOLIC PANEL
Anion gap: 6 (ref 5–15)
BUN: 20 mg/dL (ref 8–23)
CO2: 22 mmol/L (ref 22–32)
Calcium: 8.4 mg/dL — ABNORMAL LOW (ref 8.9–10.3)
Chloride: 103 mmol/L (ref 98–111)
Creatinine, Ser: 0.8 mg/dL (ref 0.44–1.00)
GFR calc Af Amer: >60 mL/min (ref >60)
GFR calc non Af Amer: >60 mL/min (ref >60)
Glucose, Bld: 136 mg/dL — ABNORMAL HIGH (ref 70–99)
Potassium: 4.1 mmol/L (ref 3.5–5.1)
Sodium: 131 mmol/L — ABNORMAL LOW (ref 135–145)

## 2020-03-17 LAB — APTT: aPTT: 54 seconds — ABNORMAL HIGH (ref 24–36)

## 2020-03-17 MED ORDER — DEXAMETHASONE 2 MG PO TABS
1.0000 mg | ORAL_TABLET | Freq: Two times a day (BID) | ORAL | Status: AC
  Administered 2020-03-17: 21:00:00 1 mg via ORAL
  Filled 2020-03-17: qty 1

## 2020-03-17 MED ORDER — AMANTADINE HCL 50 MG/5ML PO SYRP
100.0000 mg | ORAL_SOLUTION | Freq: Two times a day (BID) | ORAL | Status: DC
  Administered 2020-03-17 – 2020-03-22 (×11): 100 mg via ORAL
  Filled 2020-03-17 (×12): qty 10

## 2020-03-17 NOTE — Progress Notes (Signed)
ANTICOAGULATION CONSULT NOTE - Follow-Up  Pharmacy Consult for Argatroban Indication: HIT,  Bilateral PE  Allergies  Allergen Reactions  . Erythromycin Itching and Other (See Comments)    Severe stomach pains, diarrhea  . Heparin     Likely HIT, SRA negative    Patient Measurements: Weight: 70.2 kg (154 lb 12.2 oz)  Vital Signs: BP: 120/86 (05/07 0533) Pulse Rate: 79 (05/07 0533)  Labs: Recent Labs    03/15/20 0454 03/15/20 1632 03/16/20 0529 03/16/20 1648 03/17/20 0754  HGB 8.9*  --  8.6*  --  8.6*  HCT 29.0*  --  28.0*  --  28.0*  PLT 640*  --  516*  --  538*  APTT 58*   < > 56* 59* 54*  CREATININE 0.99  --  0.87  --  0.80   < > = values in this interval not displayed.    Estimated Creatinine Clearance: 57.4 mL/min (by C-G formula based on SCr of 0.8 mg/dL).  Assessment: 74 year old initially admitted 4/4 with acute IVH and thrombocythemia with PLTC of 990k. Patient was ultimately diagnosed with polythemia vera, now s/p leukopheresis, therapeutic phlebotomy, and initiation of hydroxyurea. On 4/13 - found to have B/L PE - decision was risk of AC was too great and IVC placed. CT scan 4/15 showed extensive thrombus within confluence of sinuses and sent to IR with mechanical thrombectomy x5.  HIT antibody sent 4/15 and found to be positive, SRA negative 4/17. Per hematology continuing on DTI for now given slight risk of false negative with plans for DOAC once tolerating POs better.  Thrombocytopenia has now resolved, hydroxyurea resumed 5/5. Hgb stable, FOBT negative 5/6. APTT remains therapeutic at 54 seconds. IVC filter removal cancelled with clot present. As pt has been therapeutic with plans to switch to oral anticoagulation once PO intake is stable, will stop q12h aPTT checks at this time.   Goal of Therapy:  aPTT 50 - 60 seconds Monitor platelets by anticoagulation protocol: Yes   Plan: -Continue argatroban to 0.75 mcg/kg/min (3.6 ml/hr)  -Daily aPTT and  CBC -Follow-up plans switching to oral anticoagulation   Arrie Senate, PharmD, BCPS Clinical Pharmacist [phone] Please check AMION for all [place] numbers 03/17/2020

## 2020-03-17 NOTE — Progress Notes (Signed)
Occupational Therapy Session Note  Patient Details  Name: [name] MRN: [id number] Date of Birth: 01-Nov-1946  Today's Date: 03/17/2020 OT Individual Time: 1015-1057 OT Individual Time Calculation (min): 42 min   Short Term Goals: Week 1:  OT Short Term Goal 1 (Week 1): Pt will don shirt wiht S OT Short Term Goal 2 (Week 1): Pt will complete oral care wiht setup OT Short Term Goal 3 (Week 1): Pt will thread BLE into pants OT Short Term Goal 4 (Week 1): Pt will complete 2/3 components of toileting with MIN A for standing balance  Skilled Therapeutic Interventions/Progress Updates:    Pt greeted in bed, declining engagement in toileting, bathing, or dressing tasks. OT presented clothing items from home as well as BSC and w/c with pt refusing to participate or transfer OOB, stating she felt "sad" and that she'd "already done that." Pt visibly had a hard time expressing why she was sad due to aphasia and became frustrated by this. Therefore OT's treatment session focused on gentle UE/LE ROM and coordination exercises while moving in beat to meaningful music to address psychosocial health. Pt was unable to state favorite artists or songs, selected "2s" when given choices of decades to listen to. She required vcs to include the Lt side, HOH to clap hands as she was unable to initiate this with verbal cuing alone. Pts affect appeared to brighten while dancing with OT, smiling and laughing at appropriate times. Near end of session she reported that she needed to "pee right now." Pt initiated transition to EOB and completed stand pivot<BSC with Min A. Min A to fully pull down brief. Pt began urinating as soon as she sat down on the commode. Note that she had urinary incontinence in brief. She was left in care of NT to complete perihygiene, change brief, and transfer back to bed.    Therapy Documentation Precautions:  Precautions Precautions: Fall Precaution Comments: cortrak Restrictions Weight  Bearing Restrictions: No Vital Signs: Oxygen Therapy O2 Device: Room Air Pain: Stomach discomfort, pt declined attempting to have a BM during session, shaking head with presentation of BSC. She also declined for OT to ask RN for antinausea medicine to address. Left her with emesis bag within reach Pain Assessment Pain Scale: 0-10 Pain Score: 0-No pain ADL: ADL Grooming: Minimal cueing Where Assessed-Grooming: Sitting at sink Upper Body Bathing: Moderate cueing Where Assessed-Upper Body Bathing: Sitting at sink Lower Body Bathing: Maximal assistance Where Assessed-Lower Body Bathing: Sitting at sink, Standing at sink Upper Body Dressing: Moderate assistance(for threadind d/t PICC and cortrak) Where Assessed-Upper Body Dressing: Sitting at sink Lower Body Dressing: Maximal assistance Where Assessed-Lower Body Dressing: Standing at sink, Sitting at sink Toileting: Maximal assistance Where Assessed-Toileting: Bedside Commode Toilet Transfer: Moderate assistance Toilet Transfer Method: Stand pivot Toilet Transfer Equipment: Bedside commode      Therapy/Group: Individual Therapy  Malory Spurr A Choice Kleinsasser 03/17/2020, 12:26 PM

## 2020-03-17 NOTE — Progress Notes (Addendum)
Physical Therapy Weekly Progress Note  Patient Details  Name: [name] MRN: [id number] Date of Birth: [birth year]  Beginning of progress report period: Mar 11, 2020 End of progress report period: Mar 17, 2020  Today's Date: 03/17/2020 PT Individual Time: 2440-1027 AND 1635-1730 PT Individual Time Calculation (min): 45 min and 55 min   Patient has met 3 of 4 short term goals.  Pt has made significant progress towards LTG over the past week. Pt continues to have fluctuating participation in therapy due to cognitive deficits affecting insight and awareness, but has progressed to supervision assist  Overall with bed mobility, transfers, and gait with rollator or UE support on IV pole up to 177f.   Patient continues to demonstrate the following deficits muscle weakness and muscle joint tightness, decreased cardiorespiratoy endurance, decreased initiation, decreased attention, decreased awareness, decreased problem solving, decreased safety awareness, decreased memory and delayed processing and decreased standing balance and decreased balance strategies and therefore will continue to benefit from skilled PT intervention to increase functional independence with mobility.  Patient progressing toward long term goals..  Continue plan of care.  PT Short Term Goals Week 1:  PT Short Term Goal 1 (Week 1): Pt will ambulate 25' w/ min assist w/ LRAD PT Short Term Goal 1 - Progress (Week 1): Met PT Short Term Goal 2 (Week 1): Pt will initiate stair training PT Short Term Goal 2 - Progress (Week 1): Met PT Short Term Goal 3 (Week 1): Pt will initiate functional tasks w/ min cues 100% of the time PT Short Term Goal 3 - Progress (Week 1): Partly met PT Short Term Goal 4 (Week 1): Pt will maintain dynamic standing balance w/ min assist PT Short Term Goal 4 - Progress (Week 1): Met Week 2:  PT Short Term Goal 1 (Week 2): Pt will ambulate 569fwith supervision assist and LRAD PT Short Term Goal 2 (Week  2): Pt will perform car transfer with min assist PT Short Term Goal 3 (Week 2): Pt will attend to task for up to 5 minutes with only moderate cues  Skilled Therapeutic Interventions/Progress Updates:  Session 1  Pt received sitting in recliner and agreeable to PT, but initially resistant, demanding husband's presence. Pt able to bed redirected with moderate cues for attention to funcitonal transfer task and education that family would return later in the afternoon. [facility]. Max cues for safe use of AD and brake management with no change in technique. [facility] attention training to engage in checkers while standing. Pt able to maintain standing 2.5 min and 1 min before requesting seated rest break. Max cues for participation and task objective intermittently throughout. PT instructed pt in stair management training with BUE support on rails and CGA for safety. Pt self selected step-to gait pattern, and was initially resistant to assistance from PT. Gait training then performed with IV pole in rehab apartment with CGA for safety with IV pole management; 2 x 2072fPatient returned to room and performed ambulatory transfer to recliner with supervision assist and rollator. Only moderate cues for safety on second use of rollator. Pt left sitting in recliner with call bell in reach and all needs met.    Session 2.  Pt received sitting in recliner and agreeable to PT. Family present for entire session to improve participation and education. Pt performed [facility]ulatory [facility] and  supervision assist, noted to trip on IV pole in turn, but no LOB. Pt transported to rehab gym in [facility]. Stair Therapist, nutritional with HHA and son managing IV pole; CGA overall with HHA on the L, completed x 4 on 5"step. Gait training with Rollator 2x 118f and 753fx 2 with CGA-supervision assist with aid for  rollator parts management in turns and safety into transfers. PT instructed p tin Modified [place] level A balance exercises: HS curl x 8, LAQ x 8, hip abduction x 8, mini squat x 8, sit<>stand x 5, tandem stance 2 x 10 sec BLE. CGA form PT for safety through dynamic balance training. PT then instructed pt in sustained attention task of low and medium peg board puzzles. Min-mod assist/cues for low difficulty puzzle and mod-max problem solving with no more than 3 options per piece to complete 1/2 of moderate difficulty puzzle. Patient returned to room and performed ambulatory transfer to recliner with rollator and CGA for safety. Pt left sitting in recliner with call bell in reach and all needs met.  RN made aware of change in safety/transfer sheet to min assist ambulatory with IV pole or [facility].      Therapy Documentation Precautions:  Precautions Precautions: Fall Precaution Comments: cortrak Restrictions Weight Bearing Restrictions: No Vital Signs: Therapy Vitals Temp: 98.3 F (36.8 C) Pulse Rate: 86 Resp: 16 BP: (!) 111/59 Patient Position (if appropriate): Sitting Oxygen Therapy SpO2: 98 % O2 Device: Room Air Pain: Pain Assessment Pain Scale: 0-10 Pain Score: 0-No pain   Therapy/Group: Individual Therapy  AuLorie Phenix/05/2020, 1:45 PM

## 2020-03-17 NOTE — Progress Notes (Signed)
Speech Language Pathology Weekly Progress and Session Note  Patient Details  Name: LATEESHA BEZOLD MRN: [id number] Date of Birth: 07-Jun-1946  Beginning of progress report period: Mar 11, 2020 End of progress report period: Mar 17, 2020  Today's Date: 03/17/2020 SLP Individual Time: 1435-1530 SLP Individual Time Calculation (min): 55 min  Short Term Goals: Week 1: SLP Short Term Goal 1 (Week 1): Patient will tolerate PO trials of thin liquids with no overt s/sx of aspiration. SLP Short Term Goal 1 - Progress (Week 1): Met SLP Short Term Goal 2 (Week 1): Patient will consume PO trials of dysphagia 2 textures with adequate mastication and oral clearance with no overt s/sx of aspiration. SLP Short Term Goal 2 - Progress (Week 1): Met SLP Short Term Goal 3 (Week 1): Patient will consume current pureed diet and nectar thick liquids with no overt s/sx of aspriation. SLP Short Term Goal 3 - Progress (Week 1): Met SLP Short Term Goal 4 (Week 1): Patient will answer conrete yes/no questions with 80% accuracy and max cues. SLP Short Term Goal 4 - Progress (Week 1): Met SLP Short Term Goal 5 (Week 1): Patient will follow 2 step directions with 80% accuracy and max cues. SLP Short Term Goal 5 - Progress (Week 1): Not met SLP Short Term Goal 6 (Week 1): Patient will name simple/fucntional objects with 80% accuracy and max cues. SLP Short Term Goal 6 - Progress (Week 1): Met    New Short Term Goals: Week 2: SLP Short Term Goal 1 (Week 2): Patient will continue to participate in water protocol with no overt s/sx of aspiration. SLP Short Term Goal 2 (Week 2): Patient will consume PO trials of dysphagia 3 textures with adequate mastication and oral clearance with no overt s/sx of aspiration. SLP Short Term Goal 3 (Week 2): Patient will name simple/fucntional objects with 80% accuracy and mod A multimodal cues. SLP Short Term Goal 4 (Week 2): Patient will write object name/function with 80% accuracy and  mod A verbal cues. SLP Short Term Goal 5 (Week 2): Patient will idenitfy object in a field of two by name/written word with 70% accuracy and mod A verbal cues. SLP Short Term Goal 6 (Week 2): Pt will express wants/needs via multimodal communication (verbal, written, clarification with yes/no questions) with min A multimodal cues.  Weekly Progress Updates: Pt met 5 out 6 goals this reporting period, requiring total -min A multimodal cues with inconsistent demonstration of ability and continued encouragement for participation. Pt demonstrated increase swallow function leading to upgrade of dys 2 textures and currently on water protocol. Pt demonstrates ability to express wants/needs verbal with mod A verbal cues for clarification, following 1 step commands, name common items with 70% accuracy, write at word level (3-5 letter words) given dictation and emerging skill to express thought, identify objects in a field of 2 with 57% accuracy, match picture to objects in a filed of 2 with 75% accuracy and increase awareness of verbal errors. ST services is currently focusing on language and swallow deficits, although cognitive deficits are still present. Pt continued to demonstrate reduced PO intake on current diet. SLP recommends treatment session later in the day verse earlier, due to family request and patient's overall language performance variblility. Pt would benefit from skills ST services in order to maximukize functional indepdence and reduce burden of care, requring 24 hour supervision and continue ST services.     Intensity: Minumum of 1-2 x/day, 30 to 90 minutes Frequency: 3  to 5 out of 7 days Duration/Length of Stay: 5/19 Treatment/Interventions: Cueing hierarchy;Multimodal communication approach;Speech/Language facilitation;Internal/external aids;Therapeutic Exercise;Patient/family education;Dysphagia/aspiration precaution training   Daily Session  Skilled Therapeutic Interventions: Skilled ST  services focused on language skills. Pt demonstrated dramatic improvement in expressive and receptive language skills, compared to last treatment with Probation officer. SLP facilitated object naming, pt demonstarted 70% accuracy with mod A semantic cues and written cues provided by pt. Pt demonstrated ability to write 3-5 letter words given dictation and write pt's full name, but was unable to write family members names nor address. Pt demonstrated ability to write object's name with 60% accuracy given min A verbal cues for error awareness. Pt refused trials of thin liquid and dys 3 textures as well as required continued encouragement to participate in treatment session. Pt was left in room with call bell within reach and chair alarm set. ST recommends to continue skilled ST services.      General    Pain Pain Assessment Pain Score: 0-No pain  Therapy/Group: Individual Therapy  Kambre Messner  [facility] 03/17/2020, 4:19 PM

## 2020-03-17 NOTE — Plan of Care (Signed)
  Problem: Consults Goal: RH GENERAL PATIENT EDUCATION Description: See Patient Education module for education specifics. Educate patient on safety Outcome: Progressing Goal: Skin Care Protocol Initiated - if Braden Score 18 or less Description:  N/A Outcome: Progressing   Problem: RH BLADDER ELIMINATION Goal: RH STG MANAGE BLADDER WITH ASSISTANCE Description: STG Manage Bladder With Min I Assistance Outcome: Progressing   Problem: RH SKIN INTEGRITY Goal: RH STG SKIN FREE OF INFECTION/BREAKDOWN Description: Skin to remain free of breakdown or infection during rehab stay Outcome: Progressing Goal: RH STG MAINTAIN SKIN INTEGRITY WITH ASSISTANCE Description: STG Maintain Skin Integrity With Min I Assistance. Outcome: Progressing   Problem: RH SAFETY Goal: RH STG ADHERE TO SAFETY PRECAUTIONS W/ASSISTANCE/DEVICE Description: STG Adhere to Safety Precautions With Min I  Assistance/Device walker. Outcome: Progressing   Problem: RH PAIN MANAGEMENT Goal: RH STG PAIN MANAGED AT OR BELOW PT'S PAIN GOAL Description: Pain goal of 3 on scale of 0-10, using facial recognition  Outcome: Progressing

## 2020-03-17 NOTE — Progress Notes (Signed)
Calorie Count Note: Day 3  72-hour calorie count ordered. Calorie count started on 03/14/20 at lunch meal. Calorie count has now ended.  Based on results of calorie count, recommend continuing nocturnal tube feeds at this time.  Diet:Dysphagia 2 with nectar-thick liquids Supplements: -Magic cup TID with meals, each supplement provides 290 kcal and 9 grams of protein  Day 3: 5/6/21Lunch:97 kcal, 4 grams protein 5/6/21Dinner:174 kcal, 8 grams protein 03/17/20 Breakfast: 370 kcal, 17 grams protein  Total24-hourPOintake: 641kcal (38% of minimum estimated needs) 29 gramsprotein (34% of minimum estimated needs)  Nutrition Diagnosis:Inadequate oral intakerelated to lethargy/confusion, dysphagiaas evidenced by meal completion < 50%.  Goal:Patient will meet greater than or equal to 90% of their needs  Intervention: - d/c 72-hour calorie count - Continue Magic Cup TID with meals - Continue nocturnal tube feeds of Glucerna 1.2 @ 70 ml/hr for 10 hours from 1900 to 0500 with Pro-stat 30 ml daily   Gaynell Face, MS, RD, LDN Inpatient Clinical Dietitian Pager: [phone] Weekend/After Hours: [phone]

## 2020-03-17 NOTE — Progress Notes (Signed)
[place] PHYSICAL MEDICINE & REHABILITATION PROGRESS NOTE  Subjective/Complaints:  IVC filter could not be removed secondary to clot formation Recommendation is to do this in 3 months if repeat scan does not demonstrate clot going through the filter Patient with some confusion last night try to get out of bed but did not have any type of injury ROS: Unable to assess due to cognition  Objective: Vital Signs: Blood pressure 120/86, pulse 79, temperature 98 F (36.7 C), temperature source Oral, resp. rate 18, weight 70.2 kg, SpO2 99 %. No results found. Recent Labs    03/16/20 0529 03/17/20 0754  WBC 10.1 8.4  HGB 8.6* 8.6*  HCT 28.0* 28.0*  PLT 516* 538*   Recent Labs    03/16/20 0529 03/17/20 0754  NA 134* 131*  K 4.2 4.1  CL 102 103  CO2 25 22  GLUCOSE 143* 136*  BUN 21 20  CREATININE 0.87 0.80  CALCIUM 8.8* 8.4*    Physical Exam: BP 120/86 (BP Location: Left Arm)   Pulse 79   Temp 98 F (36.7 C) (Oral)   Resp 18   Wt 70.2 kg   SpO2 99%   BMI 28.31 kg/m       General: No acute distress Mood and affect are appropriate Heart: Regular rate and rhythm no rubs murmurs or extra sounds Lungs: Clear to auscultation, breathing unlabored, no rales or wheezes Abdomen: Positive bowel sounds, soft nontender to palpation, nondistended Extremities: No clubbing, cyanosis, or edema Skin: No evidence of breakdown, no evidence of rash  Skin: No evidence of breakdown, no evidence of rash Neurologic: Cranial nerves II through XII intact, motor strength is 5/5 in bilateral deltoid, bicep, tricep, grip, hip flexor, knee extensors, ankle dorsiflexor and plantar flexor   Musculoskeletal: Full range of motion in all 4 extremities. No joint swelling   Assessment/Plan: 1. Functional deficits secondary to R>L brain bleeds which require 3+ hours per day of interdisciplinary therapy in a comprehensive inpatient rehab setting.  Physiatrist is providing close team supervision  and 24 hour management of active medical problems listed below.  Physiatrist and rehab team continue to assess barriers to discharge/monitor patient progress toward functional and medical goals  Care Tool:  Bathing    Body parts bathed by patient: Left arm, Chest, Abdomen, Front perineal area, Right upper leg, Left upper leg, Face, Right lower leg, Right arm, Left lower leg   Body parts bathed by helper: Buttocks     Bathing assist Assist Level: Minimal Assistance - Patient > 75%     Upper Body Dressing/Undressing Upper body dressing   What is the patient wearing?: Pull over shirt    Upper body assist Assist Level: Minimal Assistance - Patient > 75%    Lower Body Dressing/Undressing Lower body dressing      What is the patient wearing?: Incontinence brief, Pants     Lower body assist Assist for lower body dressing: Moderate Assistance - Patient 50 - 74%     Toileting Toileting    Toileting assist Assist for toileting: Minimal Assistance - Patient > 75%     Transfers Chair/bed transfer  Transfers assist     Chair/bed transfer assist level: Minimal Assistance - Patient > 75%     Locomotion Ambulation   Ambulation assist      Assist level: Moderate Assistance - Patient 50 - 74% Assistive device: Hand held assist Max distance: 15'   Walk 10 feet activity   Assist     Assist level:  Moderate Assistance - Patient - 50 - 74% Assistive device: Hand held assist   Walk 50 feet activity   Assist Walk 50 feet with 2 turns activity did not occur: Safety/medical concerns         Walk 150 feet activity   Assist Walk 150 feet activity did not occur: Safety/medical concerns         Walk 10 feet on uneven surface  activity   Assist Walk 10 feet on uneven surfaces activity did not occur: Safety/medical concerns         Wheelchair     Assist Will patient use wheelchair at discharge?: No(No PT LTG set)   Wheelchair activity did not  occur: Safety/medical concerns         Wheelchair 50 feet with 2 turns activity    Assist    Wheelchair 50 feet with 2 turns activity did not occur: Safety/medical concerns       Wheelchair 150 feet activity     Assist Wheelchair 150 feet activity did not occur: Safety/medical concerns          Medical Problem List and Plan: 1.  Deficits with mobility, self-care, cognition secondary to R>L brain bleeds.  Mainly cognitive dysfunction more so than weakness.  Also severe dysphagia  Continue CIR PT, OT, SLP  2.  PE/Antithrombotics: IVC to remain in place repeat scan in 3 months-anticoagulation:  Pharmaceutical: Other (comment)On Argatroban as pradaxa cannot be crushed.  Should be able to swallow pills, defer to hematology in terms of switching anticoagulants             -antiplatelet therapy: N/A 3. Heaches/Pain Management: On depakote tid for agitation as well as HA.     No c/os today              Monitor with increased emulation 4. Mood: LCSW to follow for evaluation and support.              -antipsychotic agents: N/A 5. Neuropsych: This patient is not capable of making decisions on her own behalf.  Telesitter for safety 6. Skin/Wound Care: Routine pressure relief measures.  7. Fluids/Electrolytes/Nutrition: Monitor I/Os.             Advance diet as tolerated 8. 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 Decadron tapered to 4 mg bid on 04/27--slow taper to off.   9. Polycythemia Vera: JAK2 mutation positive. S/p Leukapheresis X 3. Hydrea prn to keep HCT<45    HCT 27 10. HIT:  SRA negative but HIT screen at [facility] strongly positive.   Thrombocytopenia resolved  11. PAF/A fib with RVR:  Felt to be due to illness . Current recommendations are for [facility] for 6-9 months.   HR controlled 5/7             Monitor with increased activity 12.  Steroid-induced hyperglycemia on  prediabetes: Hgb A1c-6.1. Monitor BS every 4 hours with 5 units for coverage as well as SSI for elevated BS  Improving CBG (last 3)  Recent Labs    03/16/20 2111 03/17/20 0626 03/17/20 1133  GLUCAP 123* 138* 176*               Monitor with increased mobility 13. Delirium:  With sundowning.Improving  On amantadine for activation and Depakote for agitation. Low dose Seroquel prn, none taken for >3d  No agitation 14. Anemia: Continue to monitor with daily labs.   Hemoglobin  8.6 5/4- stool guaic pending  Continue to monitor 15.  Post stroke dysphagia  Dysphagia 1, nectar liquids.   Continue tube feeds  Advance diet as tolerated 16.  Essential hypertension  Continue hydralazine 100 3 times daily  Continue losartan 50 twice daily   Vitals:   03/16/20 1943 03/17/20 0533  BP: 112/67 120/86  Pulse: 85 79  Resp: 16 18  Temp: 98 F (36.7 C)   SpO2: 98% 99%   17.  Leukocytosis resolved -likely steroid-induced- weaning dexmethasone to 2mg  PO BID x 3 d, then plan for 1mg  BID x 3 d, then .5mg  BID x 3d then stop    Afebrile 18.  Severe hypoalbuminemia  Supplement initiated 19.  Peripheral edema  Lasix 20 daily started on 5/1, output not recorded, pt incont 20.  Elevated BUN  Encourage fluids Stable 5/1-5/2 21.  Urinary incont- per husband indicates need to void and generally voids 15-20 min later  22.  Anemia on anticoag negative stool guaic x1, per heme-onc starting PPI  LOS: 7 days A FACE TO [place] E Raveena Hebdon 03/17/2020, 1:35 PM

## 2020-03-17 NOTE — Progress Notes (Addendum)
Telesitter called and told staff that pt was crawling out of bed. Pt  Told tech that she had to go to the bathroom, but refused to get on [facility], and started using the bathroom on herselftPt refused to let nurse tech change her. Pt very aggressive and yelling at staff. Pt usually not aggressive or refusing care.

## 2020-03-18 LAB — BASIC METABOLIC PANEL
Anion gap: 10 (ref 5–15)
BUN: 23 mg/dL (ref 8–23)
CO2: 23 mmol/L (ref 22–32)
Calcium: 9.2 mg/dL (ref 8.9–10.3)
Chloride: 100 mmol/L (ref 98–111)
Creatinine, Ser: 1.03 mg/dL — ABNORMAL HIGH (ref 0.44–1.00)
GFR calc Af Amer: >60 mL/min (ref >60)
GFR calc non Af Amer: 54 mL/min — ABNORMAL LOW (ref >60)
Glucose, Bld: 144 mg/dL — ABNORMAL HIGH (ref 70–99)
Potassium: 4.2 mmol/L (ref 3.5–5.1)
Sodium: 133 mmol/L — ABNORMAL LOW (ref 135–145)

## 2020-03-18 LAB — TRIGLYCERIDES: Triglycerides: 107 mg/dL (ref <150)

## 2020-03-18 LAB — CBC
HCT: 30.5 % — ABNORMAL LOW (ref 36.0–46.0)
Hemoglobin: 9.4 g/dL — ABNORMAL LOW (ref 12.0–15.0)
MCH: 31.5 pg (ref 26.0–34.0)
MCHC: 30.8 g/dL (ref 30.0–36.0)
MCV: 102.3 fL — ABNORMAL HIGH (ref 80.0–100.0)
Platelets: 604 10*3/uL — ABNORMAL HIGH (ref 150–400)
RBC: 2.98 MIL/uL — ABNORMAL LOW (ref 3.87–5.11)
RDW: 20.9 % — ABNORMAL HIGH (ref 11.5–15.5)
WBC: 9.3 10*3/uL (ref 4.0–10.5)
nRBC: 0 % (ref 0.0–0.2)

## 2020-03-18 LAB — GLUCOSE, CAPILLARY
Glucose-Capillary: 134 mg/dL — ABNORMAL HIGH (ref 70–99)
Glucose-Capillary: 84 mg/dL (ref 70–99)
Glucose-Capillary: 144 mg/dL — ABNORMAL HIGH (ref 70–99)
Glucose-Capillary: 105 mg/dL — ABNORMAL HIGH (ref 70–99)

## 2020-03-18 LAB — APTT: aPTT: 57 seconds — ABNORMAL HIGH (ref 24–36)

## 2020-03-18 MED ORDER — GLUCERNA 1.2 CAL PO LIQD
350.0000 mL | ORAL | Status: DC
  Administered 2020-03-18 – 2020-03-21 (×4): 350 mL
  Filled 2020-03-18: qty 1000
  Filled 2020-03-18 (×4): qty 474
  Filled 2020-03-18: qty 1000
  Filled 2020-03-18: qty 474

## 2020-03-18 NOTE — Progress Notes (Signed)
[place] PHYSICAL MEDICINE & REHABILITATION PROGRESS NOTE  Subjective/Complaints:  Lying in bed. No new complaints. Slept well  ROS: Limited due to cognitive/behavioral   Objective: Vital Signs: Blood pressure 120/76, pulse 97, temperature 98 F (36.7 C), temperature source Oral, resp. rate 18, weight 70.2 kg, SpO2 97 %. No results found. Recent Labs    03/17/20 0754 03/18/20 0508  WBC 8.4 9.3  HGB 8.6* 9.4*  HCT 28.0* 30.5*  PLT 538* 604*   Recent Labs    03/17/20 0754 03/18/20 0508  NA 131* 133*  K 4.1 4.2  CL 103 100  CO2 22 23  GLUCOSE 136* 144*  BUN 20 23  CREATININE 0.80 1.03*  CALCIUM 8.4* 9.2    Physical Exam: BP 120/76 (BP Location: Right Arm)   Pulse 97   Temp 98 F (36.7 C) (Oral)   Resp 18   Wt 70.2 kg   SpO2 97%   BMI 28.31 kg/m       Constitutional: No distress . Vital signs reviewed. HEENT: EOMI, oral membranes moist, NGT Neck: supple Cardiovascular: RRR without murmur. No JVD    Respiratory/Chest: CTA Bilaterally without wheezes or rales. Normal effort    GI/Abdomen: BS +, non-tender, non-distended Ext: no clubbing, cyanosis, or edema Psych: pleasant and cooperative Neurologic: fair insight, stm deficits. Cranial nerves II through XII intact, motor strength is 5/5 in bilateral deltoid, bicep, tricep, grip, hip flexor, knee extensors, ankle dorsiflexor and plantar flexor   Musculoskeletal: Full range of motion in all 4 extremities. No joint swelling   Assessment/Plan: 1. Functional deficits secondary to R>L brain bleeds which require 3+ hours per day of interdisciplinary therapy in a comprehensive inpatient rehab setting.  Physiatrist is providing close team supervision and 24 hour management of active medical problems listed below.  Physiatrist and rehab team continue to assess barriers to discharge/monitor patient progress toward functional and medical goals  Care Tool:  Bathing    Body parts bathed by patient: Left arm,  Chest, Abdomen, Front perineal area, Right upper leg, Left upper leg, Face, Right lower leg, Right arm, Left lower leg   Body parts bathed by helper: Buttocks     Bathing assist Assist Level: Minimal Assistance - Patient > 75%     Upper Body Dressing/Undressing Upper body dressing   What is the patient wearing?: Pull over shirt    Upper body assist Assist Level: Minimal Assistance - Patient > 75%    Lower Body Dressing/Undressing Lower body dressing      What is the patient wearing?: Incontinence brief, Pants     Lower body assist Assist for lower body dressing: Moderate Assistance - Patient 50 - 74%     Toileting Toileting    Toileting assist Assist for toileting: Minimal Assistance - Patient > 75%     Transfers Chair/bed transfer  Transfers assist     Chair/bed transfer assist level: Supervision/Verbal cueing Chair/bed transfer assistive device: Programmer, multimedia   Ambulation assist      Assist level: Supervision/Verbal cueing Assistive device: Rollator Max distance: 150   Walk 10 feet activity   Assist     Assist level: Supervision/Verbal cueing Assistive device: Rollator   Walk 50 feet activity   Assist Walk 50 feet with 2 turns activity did not occur: Safety/medical concerns  Assist level: Supervision/Verbal cueing Assistive device: Rollator    Walk 150 feet activity   Assist Walk 150 feet activity did not occur: Safety/medical concerns  Assist level: Supervision/Verbal cueing  Assistive device: Rollator    Walk 10 feet on uneven surface  activity   Assist Walk 10 feet on uneven surfaces activity did not occur: Safety/medical concerns   Assist level: Supervision/Verbal cueing     Wheelchair     Assist Will patient use wheelchair at discharge?: No(No PT LTG set)   Wheelchair activity did not occur: Safety/medical concerns         Wheelchair 50 feet with 2 turns activity    Assist    Wheelchair  50 feet with 2 turns activity did not occur: Safety/medical concerns       Wheelchair 150 feet activity     Assist Wheelchair 150 feet activity did not occur: Safety/medical concerns          Medical Problem List and Plan: 1.  Deficits with mobility, self-care, cognition secondary to R>L brain bleeds.  Mainly cognitive dysfunction more so than weakness.  Also severe dysphagia  Continue CIR PT, OT, SLP 2.  PE/Antithrombotics: IVC to remain in place repeat scan in 3 months-anticoagulation:  Pharmaceutical: Other (comment)On Argatroban as pradaxa cannot be crushed.  Should be able to swallow pills, defer to hematology in terms of switching anticoagulants             -antiplatelet therapy: N/A 3. Heaches/Pain Management: On depakote tid for agitation as well as HA.     No c/os today              Monitor with increased emulation 4. Mood: LCSW to follow for evaluation and support.              -antipsychotic agents: N/A 5. Neuropsych: This patient is not capable of making decisions on her own behalf.  Telesitter for safety still required 6. Skin/Wound Care: Routine pressure relief measures.  7. Fluids/Electrolytes/Nutrition: Monitor I/Os.             Advance diet as tolerated 8. 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 Decadron tapered to 4 mg bid on 04/27--slow taper to off.   9. Polycythemia Vera: JAK2 mutation positive. S/p Leukapheresis X 3. Hydrea prn to keep HCT<45    HCT 27 10. HIT:  SRA negative but HIT screen at [facility] strongly positive.   Thrombocytopenia resolved  11. PAF/A fib with RVR:  Felt to be due to illness . Current recommendations are for [facility] for 6-9 months.   HR controlled 5/7             Monitor with increased activity 12.  Steroid-induced hyperglycemia on prediabetes: Hgb A1c-6.1. Monitor BS every 4 hours with 5 units for coverage as well as SSI for elevated  BS  Improved CBG (last 3)  Recent Labs    03/17/20 2105 03/18/20 0607 03/18/20 1135  GLUCAP 79 134* 84               Monitor with increased mobility 13. Delirium:  With sundowning.Improving  On amantadine for activation and Depakote for agitation. Low dose Seroquel prn, none taken for >3d  No agitation 14. Anemia: Continue to monitor with daily labs.   Hemoglobin 8.6 5/4- stool guaic pending  Continue to monitor  5/8 hgb 9.4  PPI 15.  Post stroke dysphagia  Dysphagia 2, nectar liquids. Intake poor to borderline although may be improving over last 24  Continue tube feeds, but reduce rate to stimulate appetite  Advance diet as tolerated 16.  Essential hypertension  Continue  hydralazine 100 3 times daily  Continue losartan 50 twice daily   Vitals:   03/18/20 0300 03/18/20 0735  BP: 120/76   Pulse: 97   Resp: 18   Temp: 98 F (36.7 C)   SpO2: 100% 97%   17.  Leukocytosis resolved -likely steroid-induced- weaning dexmethasone to 2mg  PO BID x 3 d, then plan for 1mg  BID x 3 d, then .5mg  BID x 3d then stop    Afebrile 18.  Severe hypoalbuminemia  Supplement initiated 19.  Peripheral edema  Lasix 20 daily started on 5/1, output not recorded, pt incont 20.  Elevated BUN  Encourage fluids Stable 5/1-5/2 21.  Urinary incont- per husband indicates need to void and generally voids 15-20 min later    LOS: 8 days A FACE TO [place] 03/18/2020, 11:41 AM

## 2020-03-18 NOTE — Progress Notes (Signed)
ANTICOAGULATION CONSULT NOTE - Follow-Up  Pharmacy Consult for Argatroban Indication: HIT, Bilateral PE  Allergies  Allergen Reactions  . Erythromycin Itching and Other (See Comments)    Severe stomach pains, diarrhea  . Heparin     Likely HIT, SRA negative    Patient Measurements: Weight: 70.2 kg (154 lb 12.2 oz)  Vital Signs: Temp: 98 F (36.7 C) (05/08 0300) Temp Source: Oral (05/08 0300) BP: 120/76 (05/08 0300) Pulse Rate: 97 (05/08 0300)  Labs: Recent Labs    03/16/20 0529 03/16/20 0529 03/16/20 1648 03/17/20 0754 03/18/20 0508  HGB 8.6*   < >  --  8.6* 9.4*  HCT 28.0*  --   --  28.0* 30.5*  PLT 516*  --   --  538* 604*  APTT 56*   < > 59* 54* 57*  CREATININE 0.87  --   --  0.80 1.03*   < > = values in this interval not displayed.    Estimated Creatinine Clearance: 44.6 mL/min (A) (by C-G formula based on SCr of 1.03 mg/dL (H)).  Assessment: 74 year old initially admitted 4/4 with acute IVH and thrombocythemia with PLTC of 990k. Patient was ultimately diagnosed with polythemia vera, now s/p leukopheresis, therapeutic phlebotomy, and initiation of hydroxyurea. On 4/13 - found to have B/L PE - decision was risk of AC was too great and IVC placed. CT scan 4/15 showed extensive thrombus within confluence of sinuses and sent to IR with mechanical thrombectomy x5.  HIT antibody sent 4/15 and found to be positive, SRA negative 4/17. Per hematology continuing on DTI for now given slight risk of false negative with plans for DOAC once tolerating POs better.  Thrombocytopenia has now resolved, hydroxyurea resumed 5/5. Hgb stable, FOBT negative 5/6. APTT remains therapeutic at 57 seconds this AM. IVC filter to remain in place until repeat scan in 3 months.  Goal of Therapy:  aPTT 50 - 60 seconds Monitor platelets by anticoagulation protocol: Yes   Plan: -Continue argatroban to 0.75 mcg/kg/min (3.6 ml/hr)  -Daily aPTT and CBC -Follow-up plans to switch to oral  anticoagulation once PO intake stable   Kennon Holter, PharmD PGY1 Ambulatory Care Pharmacy Resident 03/18/2020

## 2020-03-18 NOTE — Plan of Care (Signed)
  Problem: RH SAFETY Goal: RH STG ADHERE TO SAFETY PRECAUTIONS W/ASSISTANCE/DEVICE Description: STG Adhere to Safety Precautions With Min I  Assistance/Device walker. Outcome: Progressing   Problem: RH PAIN MANAGEMENT Goal: RH STG PAIN MANAGED AT OR BELOW PT'S PAIN GOAL Description: Pain goal of 3 on scale of 0-10, using facial recognition  Outcome: Progressing

## 2020-03-19 ENCOUNTER — Inpatient Hospital Stay (HOSPITAL_COMMUNITY): Payer: Medicare HMO | Admitting: Occupational Therapy

## 2020-03-19 LAB — CBC
HCT: 29.8 % — ABNORMAL LOW (ref 36.0–46.0)
Hemoglobin: 9.3 g/dL — ABNORMAL LOW (ref 12.0–15.0)
MCH: 31.6 pg (ref 26.0–34.0)
MCHC: 31.2 g/dL (ref 30.0–36.0)
MCV: 101.4 fL — ABNORMAL HIGH (ref 80.0–100.0)
Platelets: 486 10*3/uL — ABNORMAL HIGH (ref 150–400)
RBC: 2.94 MIL/uL — ABNORMAL LOW (ref 3.87–5.11)
RDW: 21 % — ABNORMAL HIGH (ref 11.5–15.5)
WBC: 6.6 10*3/uL (ref 4.0–10.5)
nRBC: 0 % (ref 0.0–0.2)

## 2020-03-19 LAB — BASIC METABOLIC PANEL
Anion gap: 9 (ref 5–15)
BUN: 19 mg/dL (ref 8–23)
CO2: 23 mmol/L (ref 22–32)
Calcium: 9.1 mg/dL (ref 8.9–10.3)
Chloride: 103 mmol/L (ref 98–111)
Creatinine, Ser: 0.84 mg/dL (ref 0.44–1.00)
GFR calc Af Amer: >60 mL/min (ref >60)
GFR calc non Af Amer: >60 mL/min (ref >60)
Glucose, Bld: 110 mg/dL — ABNORMAL HIGH (ref 70–99)
Potassium: 4.2 mmol/L (ref 3.5–5.1)
Sodium: 135 mmol/L (ref 135–145)

## 2020-03-19 LAB — GLUCOSE, CAPILLARY
Glucose-Capillary: 104 mg/dL — ABNORMAL HIGH (ref 70–99)
Glucose-Capillary: 136 mg/dL — ABNORMAL HIGH (ref 70–99)
Glucose-Capillary: 124 mg/dL — ABNORMAL HIGH (ref 70–99)
Glucose-Capillary: 101 mg/dL — ABNORMAL HIGH (ref 70–99)

## 2020-03-19 LAB — APTT: aPTT: 54 seconds — ABNORMAL HIGH (ref 24–36)

## 2020-03-19 NOTE — Plan of Care (Signed)
  Problem: RH BLADDER ELIMINATION Goal: RH STG MANAGE BLADDER WITH ASSISTANCE Description: STG Manage Bladder With Min I Assistance Outcome: Progressing   Problem: RH SAFETY Goal: RH STG ADHERE TO SAFETY PRECAUTIONS W/ASSISTANCE/DEVICE Description: STG Adhere to Safety Precautions With Min I  Assistance/Device walker. Outcome: Progressing

## 2020-03-19 NOTE — Progress Notes (Addendum)
[place] PHYSICAL MEDICINE & REHABILITATION PROGRESS NOTE  Subjective/Complaints:  No new issues. Slept last night. Denies pain  ROS: Limited due to cognitive/behavioral    Objective: Vital Signs: Blood pressure 122/73, pulse 83, temperature 98.7 F (37.1 C), resp. rate 17, weight 67.7 kg, SpO2 96 %. No results found. Recent Labs    03/18/20 0508 03/19/20 0729  WBC 9.3 6.6  HGB 9.4* 9.3*  HCT 30.5* 29.8*  PLT 604* 486*   Recent Labs    03/18/20 0508 03/19/20 0729  NA 133* 135  K 4.2 4.2  CL 100 103  CO2 23 23  GLUCOSE 144* 110*  BUN 23 19  CREATININE 1.03* 0.84  CALCIUM 9.2 9.1    Physical Exam: BP 122/73 (BP Location: Right Arm)   Pulse 83   Temp 98.7 F (37.1 C)   Resp 17   Wt 67.7 kg   SpO2 96%   BMI 27.30 kg/m   Constitutional: No distress . Vital signs reviewed. HEENT: EOMI, oral membranes moist Neck: supple Cardiovascular: RRR without murmur. No JVD    Respiratory/Chest: CTA Bilaterally without wheezes or rales. Normal effort    GI/Abdomen: BS +, non-tender, non-distended Ext: no clubbing, cyanosis, or edema Psych: pleasant but quitet Neurologic: fair insight, stm deficits. Cranial nerves II through XII intact, motor strength is 5/5 in bilateral deltoid, bicep, tricep, grip, hip flexor, knee extensors, ankle dorsiflexor and plantar flexor   Musculoskeletal: Full range of motion in all 4 extremities. No joint swelling   Assessment/Plan: 1. Functional deficits secondary to R>L brain bleeds which require 3+ hours per day of interdisciplinary therapy in a comprehensive inpatient rehab setting.  Physiatrist is providing close team supervision and 24 hour management of active medical problems listed below.  Physiatrist and rehab team continue to assess barriers to discharge/monitor patient progress toward functional and medical goals  Care Tool:  Bathing    Body parts bathed by patient: Left arm, Chest, Abdomen, Front perineal area, Right  upper leg, Left upper leg, Face, Right lower leg, Right arm, Left lower leg   Body parts bathed by helper: Buttocks     Bathing assist Assist Level: Minimal Assistance - Patient > 75%     Upper Body Dressing/Undressing Upper body dressing   What is the patient wearing?: Pull over shirt    Upper body assist Assist Level: Minimal Assistance - Patient > 75%    Lower Body Dressing/Undressing Lower body dressing      What is the patient wearing?: Incontinence brief, Pants     Lower body assist Assist for lower body dressing: Moderate Assistance - Patient 50 - 74%     Toileting Toileting    Toileting assist Assist for toileting: Minimal Assistance - Patient > 75%     Transfers Chair/bed transfer  Transfers assist     Chair/bed transfer assist level: Supervision/Verbal cueing Chair/bed transfer assistive device: Programmer, multimedia   Ambulation assist      Assist level: Supervision/Verbal cueing Assistive device: Rollator Max distance: 150   Walk 10 feet activity   Assist     Assist level: Supervision/Verbal cueing Assistive device: Rollator   Walk 50 feet activity   Assist Walk 50 feet with 2 turns activity did not occur: Safety/medical concerns  Assist level: Supervision/Verbal cueing Assistive device: Rollator    Walk 150 feet activity   Assist Walk 150 feet activity did not occur: Safety/medical concerns  Assist level: Supervision/Verbal cueing Assistive device: Rollator    Walk 10  feet on uneven surface  activity   Assist Walk 10 feet on uneven surfaces activity did not occur: Safety/medical concerns   Assist level: Supervision/Verbal cueing     Wheelchair     Assist Will patient use wheelchair at discharge?: No(No PT LTG set)   Wheelchair activity did not occur: Safety/medical concerns         Wheelchair 50 feet with 2 turns activity    Assist    Wheelchair 50 feet with 2 turns activity did not  occur: Safety/medical concerns       Wheelchair 150 feet activity     Assist Wheelchair 150 feet activity did not occur: Safety/medical concerns          Medical Problem List and Plan: 1.  Deficits with mobility, self-care, cognition secondary to R>L brain bleeds.  Mainly cognitive dysfunction more so than weakness.  Also severe dysphagia  Continue CIR PT, OT, SLP 2.  PE/Antithrombotics: IVC to remain in place repeat scan in 3 months-anticoagulation:  Pharmaceutical: Other (comment)On Argatroban as pradaxa cannot be crushed.  Should be able to swallow pills, defer to hematology in terms of switching anticoagulants             -antiplatelet therapy: N/A 3. Heaches/Pain Management: On depakote tid for agitation as well as HA.     No c/os today              Monitor with increased emulation 4. Mood: LCSW to follow for evaluation and support.              -antipsychotic agents: N/A 5. Neuropsych: This patient is not capable of making decisions on her own behalf.  Telesitter for safety still required 6. Skin/Wound Care: Routine pressure relief measures.  7. Fluids/Electrolytes/Nutrition: Monitor I/Os.             Advance diet as tolerated 8. 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 Decadron tapered to 4 mg bid on 04/27--slow taper to off.   9. Polycythemia Vera: JAK2 mutation positive. S/p Leukapheresis X 3. Hydrea prn to keep HCT<45    HCT 27 10. HIT:  SRA negative but HIT screen at [facility] strongly positive.   Thrombocytopenia resolved  11. PAF/A fib with RVR:  Felt to be due to illness . Current recommendations are for [facility] for 6-9 months.   HR controlled 5/9             Monitor with increased activity 12.  Steroid-induced hyperglycemia on prediabetes: Hgb A1c-6.1. Monitor BS every 4 hours with 5 units for coverage as well as SSI for elevated BS  Improved 5/9 CBG (last 3)  Recent  Labs    03/18/20 1633 03/18/20 2148 03/19/20 0542  GLUCAP 144* 105* 104*               Monitor with increased mobility 13. Delirium:  With sundowning.Improving  On amantadine for activation and Depakote for agitation. Low dose Seroquel prn, none taken for >3d  No agitation 14. Anemia: Continue to monitor with daily labs.   Hemoglobin 8.6 5/4- stool guaic pending  Continue to monitor  5/8 hgb 9.4--->9.3 on 5/9  PPI 15.  Post stroke dysphagia  Dysphagia 2, nectar liquids. Intake poor to borderline although may be improving over last 24  Reduced rate of TF on 5/8 to help stimulate appetite. Still taking in little by mouth.   5/9 -today's BMET reviewed and WNL  Advance diet as tolerated 16.  Essential hypertension  Continue hydralazine 100 3 times daily  Continue losartan 50 twice daily   Vitals:   03/19/20 0525 03/19/20 0728  BP: 122/73   Pulse: 83   Resp:    Temp: 98.7 F (37.1 C)   SpO2: 98% 96%   17.  Leukocytosis resolved -likely steroid-induced- weaning dexmethasone to 2mg  PO BID x 3 d, then plan for 1mg  BID x 3 d, then .5mg  BID x 3d then stop    Afebrile, wbc's 6.6 on 5/9 18.  Severe hypoalbuminemia  Supplement initiated 19.  Peripheral edema  Lasix 20 daily started on 5/1, output not recorded, pt incont 20.  Elevated BUN  Encourage fluids Stable 5/1-5/2  -labs stable 5/9 21.  Urinary incont- per husband indicates need to void and generally voids 15-20 min later    LOS: 9 days A FACE TO [place] 03/19/2020, 10:02 AM

## 2020-03-19 NOTE — Progress Notes (Signed)
Occupational Therapy Session Note  Patient Details  Name: FE ESSENBURG MRN: [id number] Date of Birth: [birth year]  Today's Date: 03/19/2020 OT Individual Time: 1400-1429 OT Individual Time Calculation (min): 29 min   Short Term Goals: Week 1:  OT Short Term Goal 1 (Week 1): Pt will don shirt wiht S OT Short Term Goal 2 (Week 1): Pt will complete oral care wiht setup OT Short Term Goal 3 (Week 1): Pt will thread BLE into pants OT Short Term Goal 4 (Week 1): Pt will complete 2/3 components of toileting with MIN A for standing balance  Skilled Therapeutic Interventions/Progress Updates:    Pt greeted in bed, alert and declining pain. When asked if she needed to use the bathroom, pt stated "I don't know." Agreeable to try. Supervision for transition to supine and CGA for ambulatory bathroom transfer using rollator. Max vcs for rollator management with assistance for locking brakes and appropriate placement of AD during transfers. Pt began voiding bladder as soon as she sat down on toilet. She initiated hygiene completion herself and then OT donned a clean brief as she had been incontinent in the one she was wearing. When pt was ambulating out of the bathroom she stated "rest" and began turning to sit on the rollator before it was locked. OT intervened for safety. Pt rested her head in her arm and then stated "tired." OT positioned pt near the sink where she then engaged in handwashing, oral care, and face washing while seated on rollator seat. Pt needed step by step cues to sequence these tasks, placing her hand under the faucet and tapping it when asked to turn on the water. Pt responded "I'm doing it" at these times. She then used the rollator to transfer to the recliner. Pt donned pants with Mod A, visibly fatigued by task demands. She was agreeable to remain sitting up in the recliner for a bit. Left her comfortably with LEs elevated and safety belt fastened. Tx focus placed on NMR, ADL retraining,  functional transfers, and standing balance.     Therapy Documentation Precautions:  Precautions Precautions: Fall Precaution Comments: cortrak Restrictions Weight Bearing Restrictions: No Vital Signs: Therapy Vitals Temp: 97.6 F (36.4 C) Pulse Rate: 92 Resp: 17 BP: (!) 106/59 Patient Position (if appropriate): Sitting Oxygen Therapy SpO2: 98 % O2 Device: Room Air ADL: ADL Grooming: Minimal cueing Where Assessed-Grooming: Sitting at sink Upper Body Bathing: Moderate cueing Where Assessed-Upper Body Bathing: Sitting at sink Lower Body Bathing: Maximal assistance Where Assessed-Lower Body Bathing: Sitting at sink, Standing at sink Upper Body Dressing: Moderate assistance(for threadind d/t PICC and cortrak) Where Assessed-Upper Body Dressing: Sitting at sink Lower Body Dressing: Maximal assistance Where Assessed-Lower Body Dressing: Standing at sink, Sitting at sink Toileting: Maximal assistance Where Assessed-Toileting: Bedside Commode Toilet Transfer: Moderate assistance Toilet Transfer Method: Stand pivot Toilet Transfer Equipment: Bedside commode      Therapy/Group: Individual Therapy  Emalee Knies A Benji Poynter 03/19/2020, 4:08 PM

## 2020-03-19 NOTE — Progress Notes (Signed)
ANTICOAGULATION CONSULT NOTE - Follow-Up  Pharmacy Consult for Argatroban Indication: HIT, Bilateral PE  Allergies  Allergen Reactions  . Erythromycin Itching and Other (See Comments)    Severe stomach pains, diarrhea  . Heparin     Likely HIT, SRA negative    Patient Measurements: Weight: 67.7 kg (149 lb 4 oz)  Vital Signs: Temp: 98.7 F (37.1 C) (05/09 0525) BP: 122/73 (05/09 0525) Pulse Rate: 83 (05/09 0525)  Labs: Recent Labs    03/17/20 0754 03/17/20 0754 03/18/20 0508 03/19/20 0729  HGB 8.6*   < > 9.4* 9.3*  HCT 28.0*  --  30.5* 29.8*  PLT 538*  --  604* 486*  APTT 54*  --  57* 54*  CREATININE 0.80  --  1.03* 0.84   < > = values in this interval not displayed.    Estimated Creatinine Clearance: 53.8 mL/min (by C-G formula based on SCr of 0.84 mg/dL).  Assessment: 74 year old initially admitted 4/4 with acute IVH and thrombocythemia with PLTC of 990k. Patient was ultimately diagnosed with polythemia vera, now s/p leukopheresis, therapeutic phlebotomy, and initiation of hydroxyurea. On 4/13 - found to have B/L PE - decision was risk of AC was too great and IVC placed. CT scan 4/15 showed extensive thrombus within confluence of sinuses and sent to IR with mechanical thrombectomy x5.  HIT antibody sent 4/15 and found to be positive, SRA negative 4/17. Per hematology continuing on DTI for now given slight risk of false negative with plans for DOAC once tolerating POs better. Per MD on 5/8, PO intake poor to borderline although may be slightly improving over last 24 hours.  Thrombocytopenia has now resolved, hydroxyurea resumed 5/5. Hgb stable, FOBT negative 5/6  APTT remains therapeutic at 54 seconds this AM. IVC filter to remain in place until repeat scan in 3 months.  Goal of Therapy:  aPTT 50 - 60 seconds Monitor platelets by anticoagulation protocol: Yes   Plan: -Continue argatroban to 0.75 mcg/kg/min (3.6 ml/hr)  -Daily aPTT and CBC -Follow-up plans to  switch to oral anticoagulation once PO intake stable   Kennon Holter, PharmD PGY1 Ambulatory Care Pharmacy Resident 03/19/2020

## 2020-03-20 ENCOUNTER — Inpatient Hospital Stay (HOSPITAL_COMMUNITY): Payer: Medicare HMO | Admitting: Speech Pathology

## 2020-03-20 ENCOUNTER — Inpatient Hospital Stay (HOSPITAL_COMMUNITY): Payer: Medicare HMO | Admitting: Physical Therapy

## 2020-03-20 ENCOUNTER — Encounter: Payer: Self-pay | Admitting: Oncology

## 2020-03-20 ENCOUNTER — Inpatient Hospital Stay (HOSPITAL_COMMUNITY): Payer: Medicare HMO | Admitting: Occupational Therapy

## 2020-03-20 LAB — APTT: aPTT: 56 seconds — ABNORMAL HIGH (ref 24–36)

## 2020-03-20 LAB — CBC
HCT: 31 % — ABNORMAL LOW (ref 36.0–46.0)
Hemoglobin: 9.6 g/dL — ABNORMAL LOW (ref 12.0–15.0)
MCH: 31.6 pg (ref 26.0–34.0)
MCHC: 31 g/dL (ref 30.0–36.0)
MCV: 102 fL — ABNORMAL HIGH (ref 80.0–100.0)
Platelets: 509 10*3/uL — ABNORMAL HIGH (ref 150–400)
RBC: 3.04 MIL/uL — ABNORMAL LOW (ref 3.87–5.11)
RDW: 21.1 % — ABNORMAL HIGH (ref 11.5–15.5)
WBC: 5.9 10*3/uL (ref 4.0–10.5)
nRBC: 0 % (ref 0.0–0.2)

## 2020-03-20 LAB — BASIC METABOLIC PANEL
Anion gap: 10 (ref 5–15)
BUN: 23 mg/dL (ref 8–23)
CO2: 22 mmol/L (ref 22–32)
Calcium: 9.3 mg/dL (ref 8.9–10.3)
Chloride: 105 mmol/L (ref 98–111)
Creatinine, Ser: 0.9 mg/dL (ref 0.44–1.00)
GFR calc Af Amer: >60 mL/min (ref >60)
GFR calc non Af Amer: >60 mL/min (ref >60)
Glucose, Bld: 123 mg/dL — ABNORMAL HIGH (ref 70–99)
Potassium: 4.3 mmol/L (ref 3.5–5.1)
Sodium: 137 mmol/L (ref 135–145)

## 2020-03-20 LAB — GLUCOSE, CAPILLARY
Glucose-Capillary: 111 mg/dL — ABNORMAL HIGH (ref 70–99)
Glucose-Capillary: 89 mg/dL (ref 70–99)
Glucose-Capillary: 138 mg/dL — ABNORMAL HIGH (ref 70–99)
Glucose-Capillary: 41 mg/dL — CL (ref 70–99)
Glucose-Capillary: 98 mg/dL (ref 70–99)

## 2020-03-20 NOTE — Progress Notes (Signed)
ANTICOAGULATION CONSULT NOTE - Follow-Up  Pharmacy Consult for Argatroban Indication: HIT, Bilateral PE  Allergies  Allergen Reactions  . Erythromycin Itching and Other (See Comments)    Severe stomach pains, diarrhea  . Heparin     Likely HIT, SRA negative    Patient Measurements: Weight: 67.9 kg (149 lb 11.1 oz)  Vital Signs: Temp: 98 F (36.7 C) (05/10 0511) Temp Source: Oral (05/10 0511) BP: 131/80 (05/10 0511) Pulse Rate: 86 (05/10 0511)  Labs: Recent Labs    03/18/20 0508 03/18/20 0508 03/19/20 0729 03/20/20 0605  HGB 9.4*   < > 9.3* 9.6*  HCT 30.5*  --  29.8* 31.0*  PLT 604*  --  486* 509*  APTT 57*  --  54* 56*  CREATININE 1.03*  --  0.84 0.90   < > = values in this interval not displayed.    Estimated Creatinine Clearance: 50.3 mL/min (by C-G formula based on SCr of 0.9 mg/dL).  Assessment: 74 year old initially admitted 4/4 with acute IVH and thrombocythemia with PLTC of 990k. Patient was ultimately diagnosed with polythemia vera, now s/p leukopheresis, therapeutic phlebotomy, and initiation of hydroxyurea. On 4/13 - found to have B/L PE - decision was risk of AC was too great and IVC placed. CT scan 4/15 showed extensive thrombus within confluence of sinuses and sent to IR with mechanical thrombectomy x5.  HIT antibody sent 4/15 and found to be positive, SRA negative 4/17. Per hematology continuing on DTI for now given slight risk of false negative with plans for DOAC once tolerating POs better. Per MD on 5/8, PO intake poor to borderline although may be slightly improving over last 24 hours.  Thrombocytopenia has now resolved, hydroxyurea resumed 5/5. Hgb stable, FOBT negative 5/6  APTT remains therapeutic at 56 seconds this AM. IVC filter to remain in place until repeat scan in 3 months. Pt has been taking PO meds now. Will ask MD to change to DOAC this week.   Goal of Therapy:  aPTT 50 - 60 seconds Monitor platelets by anticoagulation protocol: Yes    Plan: -Continue argatroban to 0.75 mcg/kg/min (3.6 ml/hr)  -Daily aPTT and CBC -Follow-up plans to switch to oral anticoagulation once PO intake stable  Onnie Boer, PharmD, [place], AAHIVP, CPP Infectious Disease Pharmacist 03/20/2020 11:31 AM

## 2020-03-20 NOTE — Progress Notes (Signed)
Occupational Therapy Session Note  Patient Details  Name: [name] MRN: [id number] Date of Birth: 12-19-1945  Today's Date: 03/20/2020 OT Individual Time: 1448-1530 OT Individual Time Calculation (min): 42 min    Short Term Goals: Week 2:  OT Short Term Goal 1 (Week 2): STGs=LTGs due to ELOS  Skilled Therapeutic Interventions/Progress Updates:    Upon entering the room, pt supine in bed with husband present in the room. Husband leaving at the beginning of session. Pt very pleasant and agreeable to OT intervention. RN arrives with medication to give this session. Pt performed bed mobility with supervision. Pt ambulating with use of RW and CGA for balance to sink. Pt then needing min cuing to lock rollator breaks and turn around to sit on seat at sink. Pt brushing teeth with supervision and increased time to sequence and initiate task this session. Pt verbalized "I don't think so" when asked if she needed to use bathroom and was encouraged to try. Pt ambulating with rollator into bathroom and OT managing IV pole this session. Pt needing min guard for balance with clothing management. She was able to void on commode and performs hygiene herself. Hand hygiene at sink with CGA as she returns back to bed secondary to fatigue. OT provided pt with some family pictures and she was able to name all the family/friends with 100% accuracy with increased time for 7 photos. OT assisted pt with repositioning in bed for comfort. Bed alarm activated and call bell within reach.   Therapy Documentation Precautions:  Precautions Precautions: Fall Precaution Comments: cortrak Restrictions Weight Bearing Restrictions: No General:   Vital Signs: Therapy Vitals Temp: 98.3 F (36.8 C) Temp Source: Oral Pulse Rate: 86 Resp: 18 BP: (!) 108/52 Patient Position (if appropriate): Lying Oxygen Therapy SpO2: 99 % O2 Device: Room Air Pain: Pain Assessment Pain Scale: 0-10 Pain Score: 0-No  pain ADL: ADL Grooming: Minimal cueing Where Assessed-Grooming: Sitting at sink Upper Body Bathing: Moderate cueing Where Assessed-Upper Body Bathing: Sitting at sink Lower Body Bathing: Maximal assistance Where Assessed-Lower Body Bathing: Sitting at sink, Standing at sink Upper Body Dressing: Moderate assistance(for threadind d/t PICC and cortrak) Where Assessed-Upper Body Dressing: Sitting at sink Lower Body Dressing: Maximal assistance Where Assessed-Lower Body Dressing: Standing at sink, Sitting at sink Toileting: Maximal assistance Where Assessed-Toileting: Bedside Commode Toilet Transfer: Moderate assistance Toilet Transfer Method: Stand pivot Toilet Transfer Equipment: Bedside commode   Therapy/Group: Individual Therapy  [name] 03/20/2020, 4:21 PM

## 2020-03-20 NOTE — Progress Notes (Addendum)
[name]   DOB:12-24-1945   AQ#:[id number]   KTG#:[id number]  Subjective:  [name] is resting in bed today.  No bleeding has been noted.  Remains on argatroban.  Still requiring feeding via core track.   Objective:  Vitals:   03/20/20 0511 03/20/20 0733  BP: 131/80   Pulse: 86   Resp: 18   Temp: 98 F (36.7 C)   SpO2: 98% 98%    Body mass index is 27.38 kg/m.  Intake/Output Summary (Last 24 hours) at 03/20/2020 1330 Last data filed at 03/20/2020 1256 Gross per 24 hour  Intake 325 ml  Output --  Net 325 ml      Ref Range & Units 7 d ago 2 wk ago  Heparin Induced Plt Ab 0.000 - 0.400 OD 1.469High   2.553High  CM        CBG (last 3)  Recent Labs    03/19/20 2116 03/20/20 0626 03/20/20 1239  GLUCAP 101* 111* 89     Labs:  Lab Results  Component Value Date   WBC 5.9 03/20/2020   HGB 9.6 (L) 03/20/2020   HCT 31.0 (L) 03/20/2020   MCV 102.0 (H) 03/20/2020   PLT 509 (H) 03/20/2020   NEUTROABS 11.4 (H) 03/09/2020    _0 @  Urine Studies No results for input(s): UHGB, CRYS in the last 72 hours.  Invalid input(s): UACOL, UAPR, USPG, UPH, UTP, UGL, UKET, UBIL, UNIT, UROB, ULEU, UEPI, UWBC, URBC, UBAC, [place], [place], [place]  Basic Metabolic Panel: Recent Labs  Lab 03/16/20 [phone] 03/16/20 0529 03/17/20 0754 03/17/20 0754 03/18/20 0508 03/18/20 0508 03/19/20 0729 03/20/20 0605  NA 134*  --  131*  --  133*  --  135 137  K 4.2   < > 4.1   < > 4.2   < > 4.2 4.3  CL 102  --  103  --  100  --  103 105  CO2 25  --  22  --  23  --  23 22  GLUCOSE 143*  --  136*  --  144*  --  110* 123*  BUN 21  --  20  --  23  --  19 23  CREATININE 0.87  --  0.80  --  1.03*  --  0.84 0.90  CALCIUM 8.8*  --  8.4*  --  9.2  --  9.1 9.3   < > = values in this interval not displayed.   GFR Estimated Creatinine Clearance: 50.3 mL/min (by C-G formula based on SCr of 0.9 mg/dL). Liver Function Tests: Recent Labs  Lab 03/14/20 0530  AST 22  ALT 40  ALKPHOS 85   BILITOT 0.4  PROT 5.1*  ALBUMIN 2.8*   No results for input(s): LIPASE, AMYLASE in the last 168 hours. No results for input(s): AMMONIA in the last 168 hours. Coagulation profile No results for input(s): INR, PROTIME in the last 168 hours.  CBC: Recent Labs  Lab 03/16/20 0529 03/17/20 0754 03/18/20 0508 03/19/20 0729 03/20/20 0605  WBC 10.1 8.4 9.3 6.6 5.9  HGB 8.6* 8.6* 9.4* 9.3* 9.6*  HCT 28.0* 28.0* 30.5* 29.8* 31.0*  MCV 102.9* 103.3* 102.3* 101.4* 102.0*  PLT 516* 538* 604* 486* 509*   Cardiac Enzymes: No results for input(s): CKTOTAL, CKMB, CKMBINDEX, TROPONINI in the last 168 hours. BNP: Invalid input(s): POCBNP CBG: Recent Labs  Lab 03/19/20 1147 03/19/20 1656 03/19/20 2116 03/20/20 0626 03/20/20 1239  GLUCAP 136* 124* 101* 111* 89   D-Dimer No results  for input(s): DDIMER in the last 72 hours. Hgb A1c No results for input(s): HGBA1C in the last 72 hours. Lipid Profile Recent Labs    03/18/20 0508  TRIG 107   Thyroid function studies No results for input(s): TSH, T4TOTAL, T3FREE, THYROIDAB in the last 72 hours.  Invalid input(s): FREET3 Anemia work up No results for input(s): VITAMINB12, FOLATE, FERRITIN, TIBC, IRON, RETICCTPCT in the last 72 hours. Microbiology No results found for this or any previous visit (from the past 240 hour(s)).    Studies:  No results found.  Assessment/plan: 74 y.o. [place] woman presenting 02/13/2020 with headache, found to have biventricular bleeds in the setting of panmyelosis, subsequently with pulmonary emboli and sinus venous thromboses in the setting of heparin induced thrombocytopenia   (1) polycythemia vera  (a) JAK2 mutation positive confirming diagnosis  (b) bone marrow biopsy 02/13/2020 shows no evidence of leukemia, c/w P Vera  (c)  leukapheresis x3 . Last leukapheresis Friday 02/18/2020   (i) HD catheter removed after pheresis on  02/18/2020  (d) hydrea as needed to keep HCT <45 and platelets in  normal range   (2) heparin induced thrombocytopenia: positive HIT screen x2  (a) severe thrombocythemia noted 02/24/2020  (b) bilateral pulmonary emboli 02/22/2020   (i) s/p IVC filter placement 02/22/2020   (ii) atrial fibrillation with RVR  (c) venous sinus thromboses 02/24/2020   (i) s/p mechanical thrombectomy  (d) argatroban started 02/25/2020  (e) platelet count responded to heparin withdrawal  (f) confirmatory serotonin release assay negative (see discussion below)   (i) continue argatroban for anticoagulation, no heparin   (ii) repeat HIT screen at [facility] lab again strongly positive  (g) platelets normalized as of 03/08/2020  (3) drug-induced thrombocytopenia  (a) discontinued amiodarone, inessential meds   (i) remains in sinus  (b) resolved-- platelets now elevated  (4) anemia: reticulocytes increasing; no folate, B-12 or iron deficiency; normal renal function; LDH slightly elevated but negative DAT and normal t bil (no evidence of hemolysis)--likely patient has occult bleeding--resolving--continue protonix  Plan: [name] continues to work with rehab. Remains on argatroban without any evidence of bleeding.  Still not taking p.o. consistently.  When she is taking p.o. consistently, we will plan to transition her from argatroban over to rivaroxaban full dose.  IVC filter removal on hold due to extensive IVC clot and clot in the filter itself. We will plan to repeat scans in approximately 3 months and then consider IVC filter removal at that time.  [name], NP 03/20/2020  1:30 PM Medical Oncology and Hematology [facility] [address] [place], [place] [address] Tel. [phone]    Fax. [phone]  ADDENDUM: [name] is much more alert and oriented; wants to go home; per husband walked with rollator quite a bit yesterday; taking small chunks of chicken in broth apparently w/o difficulty; no appetite but of course is on ng feeds; compression  hose in place Counts well controlled on 5 mg hydrea with Hb rising Perhaps can start taking meds po, at which point we will switch to oral anticoagulant Discussed extent of clots (into IVfilter, vena cava); discussed possible long-term anticoagulation  GM

## 2020-03-20 NOTE — Progress Notes (Signed)
Nutrition Follow-up  RD working remotely.  DOCUMENTATION CODES:   Not applicable  INTERVENTION:   Continue nocturnal tube feeds via Cortrak: - Glucerna 1.2 @ 35 ml/hr to run over 10 hours from 1900 to 0500 (total of 350 ml) - Pro-stat 30 ml daily - Free water 100 ml q 8 hours  Nocturnal tube feeding regimen and free water provides 520 kcal, 35 grams of protein, and 582 ml of H2O (31% of kcal needs, 42% of protein needs).  - Magic cup TID with meals, each supplement provides 290 kcal and 9 grams of protein  NUTRITION DIAGNOSIS:   Inadequate oral intake related to lethargy/confusion, dysphagia as evidenced by meal completion < 50%.  Progressing  GOAL:   Patient will meet greater than or equal to 90% of their needs  Progressing  MONITOR:   PO intake, Supplement acceptance, Weight trends, TF tolerance, Labs, Diet advancement  REASON FOR ASSESSMENT:   Consult Enteral/tube feeding initiation and management  ASSESSMENT:   74 year old female with PMH of HTN and hepatitis C. Pt admitted on 02/14/20 with onset of severe persistent HA and N/V. CT head done revealing acute IVH. Bone biopsy was positive for JAK2 confirming polycythemia vera. IVC filter was placed on 04/13 due to [place]. Pt underwent emergent thrombectomy with recanalization of deep system and decrease in clot burden. She continues on tube feeds for nutritional support. Pt admitted to CIR on 03/10/20.  5/08 - nocturnal TF rate reduced to stimulate appetite  A 72-hour calorie count was completed on this pt. Pt was found to be meeting 8-38% of kcal needs and 6-34% of protein needs. Pt remains on Dysphagia 2 diet with nectar-thick liquids.  Pt's weight is down 11 lbs since admission to CIR (1 week). Question accuracy of weights given significant and fast weight loss. Will continue to monitor trends. Per RN edema assessment, pt with non-pitting edema to BLE. This may be masking additional weight loss.  Pt is at risk for  malnutrition.  Current TF: Glucerna 1.2 @ 35 ml/hr to run over 10 hours from 7 pm to 5 am, Pro-stat 30 ml daily, free water 100 ml q 8 hours  Meal Completion: 15-50% x last 8 meals  Medications reviewed and include: Lasix, SSI, protonix, Klor-con, senna, argatroban  Labs reviewed: hemoglobin 9.6 CBG's: 89-124 x 24 hours  NUTRITION - FOCUSED PHYSICAL EXAM:  Unable to complete at this time. RD working remotely.  Diet Order:   Diet Order            DIET DYS 2 Room service appropriate? Yes; Fluid consistency: Nectar Thick  Diet effective now              EDUCATION NEEDS:   Not appropriate for education at this time  Skin:  Skin Assessment: Reviewed RN Assessment  Last BM:  03/19/20 medium type 6  Height:   Ht Readings from Last 1 Encounters:  02/14/20 5\' 2"  (1.575 m)    Weight:   Wt Readings from Last 1 Encounters:  03/20/20 67.9 kg    Ideal Body Weight:  50 kg  BMI:  Body mass index is 27.38 kg/m.  Estimated Nutritional Needs:   Kcal:  1700-1900  Protein:  85-100 grams  Fluid:  1.7-1.9 L    Gaynell Face, MS, RD, LDN Inpatient Clinical Dietitian Pager: [phone] Weekend/After Hours: [phone]

## 2020-03-20 NOTE — Progress Notes (Signed)
[place] PHYSICAL MEDICINE & REHABILITATION PROGRESS NOTE  Subjective/Complaints:  Pt oriented to person and [facility] but not hospital even from 3 choices   ROS: Limited due to cognitive/behavioral    Objective: Vital Signs: Blood pressure 131/80, pulse 86, temperature 98 F (36.7 C), temperature source Oral, resp. rate 18, weight 67.9 kg, SpO2 98 %. No results found. Recent Labs    03/19/20 0729 03/20/20 0605  WBC 6.6 5.9  HGB 9.3* 9.6*  HCT 29.8* 31.0*  PLT 486* 509*   Recent Labs    03/19/20 0729 03/20/20 0605  NA 135 137  K 4.2 4.3  CL 103 105  CO2 23 22  GLUCOSE 110* 123*  BUN 19 23  CREATININE 0.84 0.90  CALCIUM 9.1 9.3    Physical Exam: BP 131/80 (BP Location: Right Arm)   Pulse 86   Temp 98 F (36.7 C) (Oral)   Resp 18   Wt 67.9 kg   SpO2 98%   BMI 27.38 kg/m    General: No acute distress Mood and affect are appropriate Heart: Regular rate and rhythm no rubs murmurs or extra sounds Lungs: Clear to auscultation, breathing unlabored, no rales or wheezes Abdomen: Positive bowel sounds, soft nontender to palpation, nondistended Extremities: No clubbing, cyanosis, or edema Skin: No evidence of breakdown, no evidence of rash Neurologic: Cranial nerves II through XII intact, motor strength is 5/5 in bilateral deltoid, bicep, tricep, grip, hip flexor, knee extensors, ankle dorsiflexor and plantar flexor Sensory exam normal sensation to light touch and proprioception in bilateral upper and lower extremities Cerebellar exam normal finger to nose to finger as well as heel to shin in bilateral upper and lower extremities Musculoskeletal: Full range of motion in all 4 extremities. No joint swelling    Assessment/Plan: 1. Functional deficits secondary to R>L brain bleeds which require 3+ hours per day of interdisciplinary therapy in a comprehensive inpatient rehab setting.  Physiatrist is providing close team supervision and 24 hour management of  active medical problems listed below.  Physiatrist and rehab team continue to assess barriers to discharge/monitor patient progress toward functional and medical goals  Care Tool:  Bathing    Body parts bathed by patient: Left arm, Chest, Abdomen, Front perineal area, Right upper leg, Left upper leg, Face, Right lower leg, Right arm, Left lower leg   Body parts bathed by helper: Buttocks     Bathing assist Assist Level: Minimal Assistance - Patient > 75%     Upper Body Dressing/Undressing Upper body dressing   What is the patient wearing?: Pull over shirt    Upper body assist Assist Level: Minimal Assistance - Patient > 75%    Lower Body Dressing/Undressing Lower body dressing      What is the patient wearing?: Incontinence brief, Pants     Lower body assist Assist for lower body dressing: Moderate Assistance - Patient 50 - 74%     Toileting Toileting    Toileting assist Assist for toileting: Minimal Assistance - Patient > 75%     Transfers Chair/bed transfer  Transfers assist     Chair/bed transfer assist level: Supervision/Verbal cueing Chair/bed transfer assistive device: Programmer, multimedia   Ambulation assist      Assist level: Supervision/Verbal cueing Assistive device: Rollator Max distance: 150   Walk 10 feet activity   Assist     Assist level: Supervision/Verbal cueing Assistive device: Rollator   Walk 50 feet activity   Assist Walk 50 feet with 2 turns activity  did not occur: Safety/medical concerns  Assist level: Supervision/Verbal cueing Assistive device: Rollator    Walk 150 feet activity   Assist Walk 150 feet activity did not occur: Safety/medical concerns  Assist level: Supervision/Verbal cueing Assistive device: Rollator    Walk 10 feet on uneven surface  activity   Assist Walk 10 feet on uneven surfaces activity did not occur: Safety/medical concerns   Assist level: Supervision/Verbal cueing      Wheelchair     Assist Will patient use wheelchair at discharge?: No(No PT LTG set)   Wheelchair activity did not occur: Safety/medical concerns         Wheelchair 50 feet with 2 turns activity    Assist    Wheelchair 50 feet with 2 turns activity did not occur: Safety/medical concerns       Wheelchair 150 feet activity     Assist Wheelchair 150 feet activity did not occur: Safety/medical concerns          Medical Problem List and Plan: 1.  Deficits with mobility, self-care, cognition secondary to R>L brain bleeds.  Mainly cognitive dysfunction more so than weakness.  Also severe dysphagia  Continue CIR PT, OT, SLP 2.  PE/Antithrombotics: IVC to remain in place repeat scan in 3 months-anticoagulation:  Pharmaceutical: Other (comment)On Argatroban as pradaxa cannot be crushed.  Should be able to swallow pills, defer to hematology in terms of switching anticoagulants             -antiplatelet therapy: N/A 3. Heaches/Pain Management: On depakote tid for agitation as well as HA.     No c/os today              Monitor with increased emulation 4. Mood: LCSW to follow for evaluation and support.              -antipsychotic agents: N/A 5. Neuropsych: This patient is not capable of making decisions on her own behalf.  Telesitter for safety still required 6. Skin/Wound Care: Routine pressure relief measures. PICC site CDI  7. Fluids/Electrolytes/Nutrition: Monitor I/Os.             Advance diet as tolerated 8. 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 Decadron tapered to 4 mg bid on 04/27--slow taper to off.   9. Polycythemia Vera: JAK2 mutation positive. S/p Leukapheresis X 3. Hydrea prn to keep HCT<45  HCT 31 on 5/10 10. HIT:  SRA negative but HIT screen at [facility] strongly positive.   Thrombocytopenia resolved now with mild thrombo cytosis , Heme following 11. PAF/A fib  with RVR:  Felt to be due to illness . Current recommendations are for [facility] for 6-9 months.   HR controlled 5/9             Monitor with increased activity 12.  Steroid-induced hyperglycemia on prediabetes: Hgb A1c-6.1. Monitor BS every 4 hours with 5 units for coverage as well as SSI for elevated BS  Improved 5/10 CBG (last 3)  Recent Labs    03/19/20 1656 03/19/20 2116 03/20/20 0626  GLUCAP 124* 101* 111*               Monitor with increased mobility 13. Delirium:  With sundowning.Improving  On amantadine for activation and Depakote for agitation. Low dose Seroquel prn, none taken for >3d  No agitation 14. Anemia: Continue to monitor with daily labs.   Hemoglobin 8.6 5/4- stool guaic pending  Continue to monitor  5/8 hgb 9.4--->9.3 on 5/9  PPI 15.  Post stroke dysphagia  Dysphagia 2, nectar liquids. Intake poor to borderline although may be improving over last 24  Reduced rate of TF on 5/8 to help stimulate appetite. Still taking in little by mouth.   5/10 -today's BMET reviewed and WNL  Advance diet as tolerated 16.  Essential hypertension  Continue hydralazine 100 3 times daily  Continue losartan 50 twice daily   Vitals:   03/20/20 0511 03/20/20 0733  BP: 131/80   Pulse: 86   Resp: 18   Temp: 98 F (36.7 C)   SpO2: 98% 98%   17.  Leukocytosis resolved -likely steroid-induced- weaned dexmethasone    18.  Severe hypoalbuminemia  Supplement initiated 19.  Peripheral edema  Lasix 20 daily started on 5/1, output not recorded, pt incont 20.  Elevated BUN  Encourage fluids Stable 5/1-5/2  -labs stable 5/9 21.  Urinary incont- per husband indicates need to void and generally voids 15-20 min later    LOS: 10 days A FACE TO [place] E Clayton Jarmon 03/20/2020, 8:39 AM

## 2020-03-20 NOTE — Progress Notes (Signed)
Occupational Therapy Weekly Progress Note  Patient Details  Name: [name] MRN: [id number] Date of Birth: 02/18/1946  Beginning of progress report period: 03/11/2020 End of progress report period: 03/20/2020  Today's Date: 03/20/2020 OT Individual Time: 0930-1042 OT Individual Time Calculation (min): 72 min    Patient has met 2 of 4 short term goals.    Pt is making steady progress at time of report. Note that her functional levels fluctuate depending on fatigue and willingness to participate due to sadness at times. She can complete ambulatory toilet transfers with CGA using either RW or rollator, requires fewer cues for safety when using RW. Pt continues to require cues for motor planning during self care tasks however praxis abilities have improved since time of eval. Continue OT POC at this time with LTGs set at supervision overall.    Patient continues to demonstrate the following deficits: muscle weakness, decreased cardiorespiratoy endurance, decreased coordination and decreased motor planning, decreased initiation, decreased attention, decreased awareness, decreased problem solving and decreased safety awareness and decreased standing balance and decreased postural control and therefore will continue to benefit from skilled OT intervention to enhance overall performance with BADL.  Patient progressing toward long term goals..  Continue plan of care.  OT Short Term Goals Week 1:  OT Short Term Goal 1 (Week 1): Pt will don shirt wiht S OT Short Term Goal 1 - Progress (Week 1): Progressing toward goal OT Short Term Goal 2 (Week 1): Pt will complete oral care wiht setup OT Short Term Goal 2 - Progress (Week 1): Progressing toward goal OT Short Term Goal 3 (Week 1): Pt will thread BLE into pants OT Short Term Goal 3 - Progress (Week 1): Met OT Short Term Goal 4 (Week 1): Pt will complete 2/3 components of toileting with MIN A for standing balance OT Short Term Goal 4 - Progress  (Week 1): Met Week 2:  OT Short Term Goal 1 (Week 2): STGs=LTGs due to ELOS  Skilled Therapeutic Interventions/Progress Updates:    Pt greeted in bed, had just finished using the BSC with NT. Pt reported feeling tired but agreeable to get OOB for tx. Supine<sit completed from flat bed without bedrail with assistance to manage her blankets. Ambulatory transfer to w/c placed at sink completed using RW with CGA and assistance for proper walker placement prior to stand<sit. She then engaged in bathing/dressing tasks sit<stand at the sink. Pt required vcs for sequencing bathing tasks, perseverated on being cold and we wrapped her UB in towels as needed. CGA for sit<stand to complete perihygiene and LB dressing tasks. Pt able to complete 3/3 components of donning pants today. Needed assist for the brief, Teds, orienting gripper socks, and donning sneakers that were a little tight. Pt able to tie her shoelaces with cuing. Throughout session, pt initiated rest breaks due to fatigue, verbalizing "tired" when stopping in the middle of an ADL task. Handwashing and oral care completed with increased time and min cues today! She requested something to drink. Pt transferred back to the recliner with CGA using the RW. Once she was comfortably positioned, pt drank 6 sips of water without s/s aspiration. This was recorded on her water protocol sheet. Pt remained in the recliner at end of session with safety belt fastened, food and beverages out of reach.   Pt oriented to self and place only today.   Therapy Documentation Precautions:  Precautions Precautions: Fall Precaution Comments: cortrak Restrictions Weight Bearing Restrictions: No Vital Signs: Therapy Vitals   Temp: 98 F (36.7 C) Temp Source: Oral Pulse Rate: 86 Resp: 18 BP: 131/80 Patient Position (if appropriate): Lying Oxygen Therapy SpO2: 98 % O2 Device: Room Air Pain: Pt declined having pain during tx   ADL: ADL Grooming: Minimal  cueing Where Assessed-Grooming: Sitting at sink Upper Body Bathing: Moderate cueing Where Assessed-Upper Body Bathing: Sitting at sink Lower Body Bathing: Maximal assistance Where Assessed-Lower Body Bathing: Sitting at sink, Standing at sink Upper Body Dressing: Moderate assistance(for threadind d/t PICC and cortrak) Where Assessed-Upper Body Dressing: Sitting at sink Lower Body Dressing: Maximal assistance Where Assessed-Lower Body Dressing: Standing at sink, Sitting at sink Toileting: Maximal assistance Where Assessed-Toileting: Bedside Commode Toilet Transfer: Moderate assistance Toilet Transfer Method: Stand pivot Toilet Transfer Equipment: Bedside commode      Therapy/Group: Individual Therapy   A  03/20/2020, 7:13 AM   

## 2020-03-20 NOTE — Progress Notes (Signed)
Physical Therapy Session Note  Patient Details  Name: [name] MRN: [id number] Date of Birth: [birth year]  Today's Date: 03/20/2020 PT Individual Time: 1120-1205 PT Individual Time Calculation (min): 45 min   Short Term Goals: Week 2:  PT Short Term Goal 1 (Week 2): Pt will ambulate 55f with supervision assist and LRAD PT Short Term Goal 2 (Week 2): Pt will perform car transfer with min assist PT Short Term Goal 3 (Week 2): Pt will attend to task for up to 5 minutes with only moderate cues  Skilled Therapeutic Interventions/Progress Updates: Pt presented in recliner agreeable to therapy with encouragement. Pt stating "back pain" unable to rate however noted that upon arrival pt was sacral sitting. Pt encouraged to use bathroom prior to leaving room. Rollator placed in front of pt and pt performed ambulatory transfer to bathroom with PTA managing IV pole and CGA for rollator use. Pt required mod cues to transfer to toilet vs BSC (placed next to toilet) and minA for rollator management when turning to toilet. Pt required max cues for sequencing pulling down pants prior to sitting on toilet (+void). Once completed performed STS from toilet with CGA and was initially min guard to pull up pants however PTA noted that pt was not including brief thus PTA provided minA for brief management. Pt then began leaning on wall stating fatigue. PTA was able to re-direct pt to sit on rollator vs leaning on wall with mod cues and transferred to sink to perform hand hygiene. With max encouragement pt stood at sink for bouts up to 1 min to complete hand hygiene and wash face. In last bout of standing pt transferred to w/c. Pt transported to day room with PTA notifying nsg en route of pt's back pain. Attempted ambulation with rollator for general endurance. Pt required verbal cues for locking/unlocking breaks, performed STS supervision from w/c and ambulated approx 424fwith CGA. Pt noted to have increased sway  with rollator ?fatigue. PTA instructed pt to lock breaks to sit with pt having increased difficulty locating breaks ultimately requiring HOH assist. When PTA instructed pt to turn to sit in rollator pt began to attempt to turn rollator instead of self. PTA blocked rollator and attempted to re-direct pt to sit in seat. Pt then began leaning into rollator with PTA unable to correct action. Noted significant forward lean with pt beginning to buckle knees. With second person PTA was able to stabilize pt and second person moved rollator behind pt to facilitate pt to sit. After extended rest pt was able to perform stand pivot with HHA to w/c. Pt propelled w/c approx for sustained task and global conditioning 50 ft with supervision and increased time. PTA transported remaining distance back to room and performed ambulatory transfer with pt holding IV pole to bed with CGA. Pt returned to supine with bed flat and supervision and was able to reposition to comfort mod I. Pt left in bed with bed alarm on, call bell within reach and current needs met.      Therapy Documentation Precautions:  Precautions Precautions: Fall Precaution Comments: cortrak Restrictions Weight Bearing Restrictions: No General:   Vital Signs: Therapy Vitals Temp: 98.3 F (36.8 C) Temp Source: Oral Pulse Rate: 86 Resp: 18 BP: (!) 108/52 Patient Position (if appropriate): Lying Oxygen Therapy SpO2: 99 % O2 Device: Room Air Pain: Pain Assessment Pain Scale: 0-10 Pain Score: 0-No pain    Therapy/Group: Individual Therapy  Salvatrice Morandi  Heitor Steinhoff, PTA  03/20/2020, 4:00  PM  

## 2020-03-20 NOTE — Progress Notes (Signed)
Speech Language Pathology Daily Session Note  Patient Details  Name: [name] MRN: [id number] Date of Birth: 05-12-1946  Today's Date: 03/20/2020 SLP Individual Time: [id number] SLP Individual Time Calculation (min): 42 min  Short Term Goals: Week 2: SLP Short Term Goal 1 (Week 2): Patient will continue to participate in water protocol with no overt s/sx of aspiration. SLP Short Term Goal 2 (Week 2): Patient will consume PO trials of dysphagia 3 textures with adequate mastication and oral clearance with no overt s/sx of aspiration. SLP Short Term Goal 3 (Week 2): Patient will name simple/fucntional objects with 80% accuracy and mod A multimodal cues. SLP Short Term Goal 4 (Week 2): Patient will write object name/function with 80% accuracy and mod A verbal cues. SLP Short Term Goal 5 (Week 2): Patient will idenitfy object in a field of two by name/written word with 70% accuracy and mod A verbal cues. SLP Short Term Goal 6 (Week 2): Pt will express wants/needs via multimodal communication (verbal, written, clarification with yes/no questions) with min A multimodal cues.  Skilled Therapeutic Interventions: Pt was seen for skilled ST targeting dysphagia and education with pt's husband, [name]. Pt performed oral care with set up assist and Min A verbal cueing for accurate use of suction function on suction toothbrush. Pt consumed ~6 oz thin H2O without any overt s/sx aspiration. During upgraded trial of a dysphagia 3 solid (mechanical soft) snack, pt demonstrated functional, efficient mastication and oral clearance without need for cueing. She used general swallow precautions with Supervision A verbal cues for single sips required only when consuming thins. Would recommend continue current diet and ST will continue to assess readiness for both solid and liquid advancement with consistency in current presentation during upgraded trials.   Pt's husband was trained regarding general swallow and  aspiration precautions with emphasis on positioning, alertness, and minimizing distractions during meals, which are most applicable for pt's supervisory needs during meals. He verbalized agreement and recited information back throughout session, demonstrating learning. Therefore, pt's husband [name]) was signed off to provide supervision during meals; RN made aware and David's name listed on safety plan. Also spoke with RN regarding recommendation to contact dietary regarding when NG tube may be able to be removed, as pt's oropharyngeal swallow function has been steadily improving; therefore, from oropharyngeal swallow standpoint ST would anticipate pt could tolerate NG tube removal, unless her appetite or other dietary concerns determined to be limiting. Pt's husband also had questions regarding "rules" of water protocol, therefore education provided and questions answered to his satisfaction. Pt left sitting in bed with alarm set and needs within reach. Continue per current plan of care.       Pain Pain Assessment Pain Scale: 0-10 Pain Score: 0-No pain  Therapy/Group: Individual Therapy  Arbutus Leas 03/20/2020, 7:10 AM

## 2020-03-20 NOTE — Progress Notes (Signed)
Left message for patient's husband referred by RN whom had questions regarding insurance and provider networks. Left message for them to reach out to their insurance company and also left the number to our billing department for any further questions on our end.  Left my contact name and number for any additional financial questions or concerns.

## 2020-03-21 ENCOUNTER — Inpatient Hospital Stay (HOSPITAL_COMMUNITY): Payer: Medicare HMO | Admitting: Physical Therapy

## 2020-03-21 ENCOUNTER — Inpatient Hospital Stay (HOSPITAL_COMMUNITY): Payer: Medicare HMO

## 2020-03-21 ENCOUNTER — Inpatient Hospital Stay (HOSPITAL_COMMUNITY): Payer: Medicare HMO | Admitting: Occupational Therapy

## 2020-03-21 LAB — CBC
HCT: 30.8 % — ABNORMAL LOW (ref 36.0–46.0)
Hemoglobin: 9.5 g/dL — ABNORMAL LOW (ref 12.0–15.0)
MCH: 31.6 pg (ref 26.0–34.0)
MCHC: 30.8 g/dL (ref 30.0–36.0)
MCV: 102.3 fL — ABNORMAL HIGH (ref 80.0–100.0)
Platelets: 438 10*3/uL — ABNORMAL HIGH (ref 150–400)
RBC: 3.01 MIL/uL — ABNORMAL LOW (ref 3.87–5.11)
RDW: 21 % — ABNORMAL HIGH (ref 11.5–15.5)
WBC: 5.1 10*3/uL (ref 4.0–10.5)
nRBC: 0 % (ref 0.0–0.2)

## 2020-03-21 LAB — GLUCOSE, CAPILLARY
Glucose-Capillary: 98 mg/dL (ref 70–99)
Glucose-Capillary: 149 mg/dL — ABNORMAL HIGH (ref 70–99)
Glucose-Capillary: 129 mg/dL — ABNORMAL HIGH (ref 70–99)

## 2020-03-21 LAB — APTT
aPTT: 40 seconds — ABNORMAL HIGH (ref 24–36)
aPTT: 51 seconds — ABNORMAL HIGH (ref 24–36)

## 2020-03-21 LAB — TRIGLYCERIDES: Triglycerides: 111 mg/dL (ref <150)

## 2020-03-21 MED ORDER — ARGATROBAN 50 MG/50ML IV SOLN
0.8000 ug/kg/min | INTRAVENOUS | Status: AC
  Administered 2020-03-21 – 2020-03-23 (×4): 0.8 ug/kg/min via INTRAVENOUS
  Filled 2020-03-21 (×4): qty 50

## 2020-03-21 NOTE — Progress Notes (Signed)
Occupational Therapy Session Note  Patient Details  Name: [name] MRN: [id number] Date of Birth: June 20, 1946  Today's Date: 03/21/2020 OT Individual Time: 1500-1540 OT Individual Time Calculation (min): 40 min  20 missed minutes secondary to fatigue  Short Term Goals: Week 2:  OT Short Term Goal 1 (Week 2): STGs=LTGs due to ELOS  Skilled Therapeutic Interventions/Progress Updates:    Upon entering the room, pt supine in bed resting with no c/o pain. Pt reports extreme fatigue and says, ," Oh no, I have another one." Pt requesting to just remain in bed to sleep secondary to fatigue. Pt agreeable to attempt toileting with BSC brought beside of bed for energy conservation. Pt performed bed mobility with supervision. OT assisting with management of IV and pt performed stand pivot transfer onto [facility] with supervision. Pt was able to void and performed hygiene with supervision. Pt returning to bed with supervision as well. Sit >supine with mod I. OT provided pt with level 1 theraband for B UE strengthening exercises with pt performing 3 sets of 10 bicep curls and chest pulls. Pt taking rest breaks after each set secondary to fatigue and requesting again to rest. OT repositioned pt with all needs within reach and pt resting  at end of session. Bed alarm activated.   Therapy Documentation Precautions:  Precautions Precautions: Fall Precaution Comments: cortrak Restrictions Weight Bearing Restrictions: No Pain: Pain Assessment Pain Score: 0-No pain ADL: ADL Grooming: Minimal cueing Where Assessed-Grooming: Sitting at sink Upper Body Bathing: Moderate cueing Where Assessed-Upper Body Bathing: Sitting at sink Lower Body Bathing: Maximal assistance Where Assessed-Lower Body Bathing: Sitting at sink, Standing at sink Upper Body Dressing: Moderate assistance(for threadind d/t PICC and cortrak) Where Assessed-Upper Body Dressing: Sitting at sink Lower Body Dressing: Maximal  assistance Where Assessed-Lower Body Dressing: Standing at sink, Sitting at sink Toileting: Maximal assistance Where Assessed-Toileting: Bedside Commode Toilet Transfer: Moderate assistance Toilet Transfer Method: Stand pivot Toilet Transfer Equipment: Bedside commode   Therapy/Group: Individual Therapy  [name] 03/21/2020, 3:53 PM

## 2020-03-21 NOTE — Progress Notes (Signed)
Speech Language Pathology Daily Session Note  Patient Details  Name: QUETZALY TOSTE MRN: [id number] Date of Birth: [birth year]  Today's Date: 03/21/2020 SLP Individual Time: 1003-1100 SLP Individual Time Calculation (min): 57 min  Short Term Goals: Week 2: SLP Short Term Goal 1 (Week 2): Patient will continue to participate in water protocol with no overt s/sx of aspiration. SLP Short Term Goal 2 (Week 2): Patient will consume PO trials of dysphagia 3 textures with adequate mastication and oral clearance with no overt s/sx of aspiration. SLP Short Term Goal 3 (Week 2): Patient will name simple/fucntional objects with 80% accuracy and mod A multimodal cues. SLP Short Term Goal 4 (Week 2): Patient will write object name/function with 80% accuracy and mod A verbal cues. SLP Short Term Goal 5 (Week 2): Patient will idenitfy object in a field of two by name/written word with 70% accuracy and mod A verbal cues. SLP Short Term Goal 6 (Week 2): Pt will express wants/needs via multimodal communication (verbal, written, clarification with yes/no questions) with min A multimodal cues.  Skilled Therapeutic Interventions: Skilled ST services focused on swallow and language skills. SLP facilitated PO consumption of thin via straw and later dys 3 following oral care to assess readiness of diet upgrade. Pt demonstrated no overt s/s aspiration consuming consecutive sips of thin liquid via straw and appropriate mastication/oral clearance as well as no overt aspiration on dys 3 trials. SLP upgraded diet to thin liquids and dys 3, with full supervision to encourage PO intake and water is allowed independently to increase hydration. Pt demonstrated dramatic increase in expressive and receptive language since last treatment session with Probation officer.  SLP facilitated expressive langague skills, pt demonstrated ability to name common objects with 100% accuracy given mod A semantic and occasional binary choice cues, describe  simple pictures at phrase level with 100% accuracy and supervision A verbal cues for use of specific language, write up to 4 word phrases to describe picture cards with x 1 spelling error (no awareness), write pt's address and express verbal wants/needs phrase/sentence level with supervision A verbal cues for clarification. SLP also facilitated receptive language skills, pt demonstrated ability to identity common objects by name and then written word with 100% accuracy and identify objects in a field of three when given cloze phrases with 100% accuracy  with mod A verbal cues for comprehension of directions. Pt demonstrated increase in naming deficits with an increase in complexity of task ( matching objects to word or phrase.) SLP also facilitated basic problem solving skills utilizing call bell with min A verbal cues and in 3 step card sequencing task pt required max A verbal cues to understand concept. Pt demonstrated increase awareness of verbal errors, however no awareness of functional errors. SLP will continue addressing language goals with an increase shift to focus on cognitive skills. Pt was left in room with call bell within reach and bed alarm set. ST recommends to continue skilled ST services.      Pain Pain Assessment Pain Score: 0-No pain  Therapy/Group: Individual Therapy  Saran Laviolette  [facility] 03/21/2020, 12:46 PM

## 2020-03-21 NOTE — Progress Notes (Signed)
[place] PHYSICAL MEDICINE & REHABILITATION PROGRESS NOTE  Subjective/Complaints:   ROS: Limited due to cognitive/behavioral    Objective: Vital Signs: Blood pressure 116/65, pulse 80, temperature 98.2 F (36.8 C), resp. rate 16, weight 67.9 kg, SpO2 96 %. No results found. Recent Labs    03/20/20 0605 03/21/20 0508  WBC 5.9 5.1  HGB 9.6* 9.5*  HCT 31.0* 30.8*  PLT 509* 438*   Recent Labs    03/19/20 0729 03/20/20 0605  NA 135 137  K 4.2 4.3  CL 103 105  CO2 23 22  GLUCOSE 110* 123*  BUN 19 23  CREATININE 0.84 0.90  CALCIUM 9.1 9.3    Physical Exam: BP 116/65 (BP Location: Right Arm)   Pulse 80   Temp 98.2 F (36.8 C)   Resp 16   Wt 67.9 kg   SpO2 96%   BMI 27.38 kg/m     General: No acute distress Mood and affect are appropriate Heart: Regular rate and rhythm no rubs murmurs or extra sounds Lungs: Clear to auscultation, breathing unlabored, no rales or wheezes Abdomen: Positive bowel sounds, soft nontender to palpation, nondistended Extremities: No clubbing, cyanosis, or edema Skin: No evidence of breakdown, no evidence of rash   Cerebellar exam normal finger to nose to finger as well as heel to shin in bilateral upper and lower extremities Musculoskeletal: Full range of motion in all 4 extremities. No joint swelling  5-/5 in BUE and BLE   Assessment/Plan: 1. Functional deficits secondary to R>L brain bleeds which require 3+ hours per day of interdisciplinary therapy in a comprehensive inpatient rehab setting.  Physiatrist is providing close team supervision and 24 hour management of active medical problems listed below.  Physiatrist and rehab team continue to assess barriers to discharge/monitor patient progress toward functional and medical goals  Care Tool:  Bathing    Body parts bathed by patient: Left arm, Chest, Abdomen, Front perineal area, Right upper leg, Left upper leg, Face, Right lower leg, Right arm, Left lower leg, Buttocks    Body parts bathed by helper: Buttocks     Bathing assist Assist Level: Contact Guard/Touching assist     Upper Body Dressing/Undressing Upper body dressing   What is the patient wearing?: Pull over shirt    Upper body assist Assist Level: Minimal Assistance - Patient > 75%(to manage PICC)    Lower Body Dressing/Undressing Lower body dressing      What is the patient wearing?: Incontinence brief, Pants     Lower body assist Assist for lower body dressing: Moderate Assistance - Patient 50 - 74%     Toileting Toileting    Toileting assist Assist for toileting: Contact Guard/Touching assist     Transfers Chair/bed transfer  Transfers assist     Chair/bed transfer assist level: Contact Guard/Touching assist Chair/bed transfer assistive device: Programmer, multimedia   Ambulation assist      Assist level: Supervision/Verbal cueing Assistive device: Rollator Max distance: 150   Walk 10 feet activity   Assist     Assist level: Supervision/Verbal cueing Assistive device: Rollator   Walk 50 feet activity   Assist Walk 50 feet with 2 turns activity did not occur: Safety/medical concerns  Assist level: Supervision/Verbal cueing Assistive device: Rollator    Walk 150 feet activity   Assist Walk 150 feet activity did not occur: Safety/medical concerns  Assist level: Supervision/Verbal cueing Assistive device: Rollator    Walk 10 feet on uneven surface  activity  Assist Walk 10 feet on uneven surfaces activity did not occur: Safety/medical concerns   Assist level: Supervision/Verbal cueing     Wheelchair     Assist Will patient use wheelchair at discharge?: No(No PT LTG set)   Wheelchair activity did not occur: Safety/medical concerns         Wheelchair 50 feet with 2 turns activity    Assist    Wheelchair 50 feet with 2 turns activity did not occur: Safety/medical concerns       Wheelchair 150 feet activity      Assist Wheelchair 150 feet activity did not occur: Safety/medical concerns          Medical Problem List and Plan: 1.  Deficits with mobility, self-care, cognition secondary to R>L brain bleeds.  Mainly cognitive dysfunction more so than weakness.  Also severe dysphagia  Continue CIR PT, OT, SLP 2.  PE/Antithrombotics: IVC to remain in place repeat scan in 3 months-anticoagulation:  Pharmaceutical: Other (comment)On Argatroban as pradaxa cannot be crushed.  Should be able to swallow pills, defer to hematology in terms of switching anticoagulants             -antiplatelet therapy: N/A 3. Heaches/Pain Management: On depakote tid for agitation as well as HA.     No c/os today              Monitor with increased emulation 4. Mood: LCSW to follow for evaluation and support.              -antipsychotic agents: N/A 5. Neuropsych: This patient is not capable of making decisions on her own behalf.  Telesitter for safety still required 6. Skin/Wound Care: Routine pressure relief measures. PICC site CDI  7. Fluids/Electrolytes/Nutrition: Monitor I/Os.             Advance diet as tolerated 8. 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 Decadron tapered to 4 mg bid on 04/27--slow taper to off.   9. Polycythemia Vera: JAK2 mutation positive. S/p Leukapheresis X 3. Hydrea prn to keep HCT<45  HCT 31 on 5/10 10. HIT:  SRA negative but HIT screen at [facility] strongly positive.   Thrombocytopenia resolved now with mild thrombo cytosis , Heme following 11. PAF/A fib with RVR:  Felt to be due to illness . Current recommendations are for [facility] for 6-9 months.   HR controlled 5/9             Monitor with increased activity 12.  Steroid-induced hyperglycemia on prediabetes: Hgb A1c-6.1. Monitor BS every 4 hours with  SSI for elevated BS  Improved 5/10 CBG (last 3)  Recent Labs    03/20/20 2106 03/20/20 2130  03/21/20 0610  GLUCAP 41* 98 98          will d/c novalog 5U with meals  13. Delirium:  With sundowning.Improving  On amantadine for activation and Depakote for agitation. Low dose Seroquel prn, none taken for >3d  No agitation 14. Anemia: Continue to monitor with daily labs.   Hemoglobin 8.6 5/4- stool guaic pending  Continue to monitor  5/8 hgb 9.4--->9.3 on 5/9  PPI 15.  Post stroke dysphagia  Dysphagia 2, nectar liquids. Intake poor to borderline although may be improving over last 24  Reduced rate of TF on 5/8 to help stimulate appetite. Still taking in little by mouth.   5/10BMET reviewed and WNL  Advance diet as tolerated 16.  Essential hypertension  Continue hydralazine 100 3 times daily  Continue losartan 50 twice daily   Vitals:   03/21/20 0532 03/21/20 0729  BP: 116/65   Pulse: 80   Resp:    Temp: 98.2 F (36.8 C)   SpO2: 99% 96%  BP controlled     18.  Severe hypoalbuminemia  Supplement initiated 19.  Peripheral edema  Lasix 20 daily started on 5/1, output not recorded, pt incont 20.  Elevated BUN  Encourage fluids Stable 5/1-5/2  -labs stable 5/9 21.  Urinary incont- per husband indicates need to void and generally voids 15-20 min later    LOS: 11 days A FACE TO [place] E  03/21/2020, 8:45 AM

## 2020-03-21 NOTE — Plan of Care (Signed)
  Problem: RH SAFETY Goal: RH STG ADHERE TO SAFETY PRECAUTIONS W/ASSISTANCE/DEVICE Description: STG Adhere to Safety Precautions With Mod I Assistance/Device walker. Outcome: Progressing   Problem: RH BLADDER ELIMINATION Goal: RH STG MANAGE BLADDER WITH ASSISTANCE Description: STG Manage Bladder With Mod I Assistance Outcome: Progressing

## 2020-03-21 NOTE — Progress Notes (Signed)
Physical Therapy Session Note  Patient Details  Name: [name] MRN: [id number] Date of Birth: [birth year]  Today's Date: 03/21/2020 PT Individual Time: 3335-4562 PT Individual Time Calculation (min): 70 min   Short Term Goals: Week 2:  PT Short Term Goal 1 (Week 2): Pt will ambulate 39f with supervision assist and LRAD PT Short Term Goal 2 (Week 2): Pt will perform car transfer with min assist PT Short Term Goal 3 (Week 2): Pt will attend to task for up to 5 minutes with only moderate cues  Skilled Therapeutic Interventions/Progress Updates: Pt presented in bed agreeable to therapy. Pt denies pain at start of session. Pt noted to not have pants on this PTA donned TED hose total A, then pt performed supine to sit supervision with use of bed features. PTA threaded pants and donned shoes for energy conservation and pt performed STS supervision and pulled pants over hips supervision. Holding onto IV pole pt ambulated to bathroom with CGA due to IV pole management and performed toilet transfers with CGA and mod cues for sequencing (-void). Pt able to perform STS from standard toilet CGA and used IV pole CGA to ambulate to w/c. Pt transported to rehab gym for energy conservation and participated in seated and standing therex for BLE strengthening and general conditioning. Performed LAQ, standing hip abd/add, mini-squats, hamstring pulls with level 3 resistance band 2 x 10 bilaterally. Pt provided with extended rest breaks between bouts due to fatigue. Pt ambulated 522fwith IV pole and CGA due to IV pole management. Pt then participated in peg board activity for sustained task. Pt was able to attend to task for approx 6 min then noted increased fatigue as pt initally required min cues for correct peg placement then requiring mod to maxA for choosing correct color and correct placement. Pt transported back to room and performed stand pivot to bed with IV pole and CGA. Performed bed mobility mod I and  repositioned to comfort. Pt left in bed at end of session with bed alarm on, call bell within reach and needs met.      Therapy Documentation Precautions:  Precautions Precautions: Fall Precaution Comments: cortrak Restrictions Weight Bearing Restrictions: No General:   Vital Signs:   Pain: Pain Assessment Pain Score: 0-No pain   Therapy/Group: Individual Therapy  Marcea Rojek  Alucard Fearnow, PTA  03/21/2020, 3:52 PM

## 2020-03-21 NOTE — Progress Notes (Signed)
ANTICOAGULATION CONSULT NOTE - Follow-Up  Pharmacy Consult for Argatroban Indication: HIT, Bilateral PE  Allergies  Allergen Reactions  . Erythromycin Itching and Other (See Comments)    Severe stomach pains, diarrhea  . Heparin     Likely HIT, SRA negative    Patient Measurements: Weight: 67.9 kg (149 lb 11.1 oz)  Vital Signs: Temp: 98.2 F (36.8 C) (05/11 0532) BP: 116/65 (05/11 0532) Pulse Rate: 80 (05/11 0532)  Labs: Recent Labs    03/19/20 0729 03/19/20 0729 03/20/20 0605 03/21/20 0508  HGB 9.3*   < > 9.6* 9.5*  HCT 29.8*  --  31.0* 30.8*  PLT 486*  --  509* 438*  APTT 54*  --  56* 40*  CREATININE 0.84  --  0.90  --    < > = values in this interval not displayed.    Estimated Creatinine Clearance: 50.3 mL/min (by C-G formula based on SCr of 0.9 mg/dL).  Assessment: 74 year old initially admitted 4/4 with acute IVH and thrombocythemia with PLTC of 990k. Patient was ultimately diagnosed with polythemia vera, now s/p leukopheresis, therapeutic phlebotomy, and initiation of hydroxyurea. On 4/13 - found to have B/L PE - decision was risk of AC was too great and IVC placed. CT scan 4/15 showed extensive thrombus within confluence of sinuses and sent to IR with mechanical thrombectomy x5.  HIT antibody sent 4/15 and found to be positive, SRA negative 4/17. Per hematology continuing on DTI for now given slight risk of false negative with plans for DOAC once tolerating POs better. Per MD on 5/10, PO intake still poor but slowly improving.  Aptt unexpectedly subtherapeutic after being stable on 0.50mcg/kg/min for 7 days. H/H stable. Per RN, infusion was not held for any reason to her knowledge. No signs of bleeding. Pt is taking crushed meds in applesauce, not whole meds. Of note, patient has very narrow therapeutic goal.   Goal of Therapy:  aPTT 50 - 60 seconds Monitor platelets by anticoagulation protocol: Yes   Plan: -Increase argatroban to 0.8 mcg/kg/min (3.6 ml/hr)   -Repeat aPTT in 3 hrs  -Daily aPTT and CBC -Switch to Rivaroxaban when PO intake stable   Benetta Spar, PharmD, BCPS, [facility] Clinical Pharmacist  Please check AMION for all [place] phone numbers After 10:00 PM, call [place]

## 2020-03-22 ENCOUNTER — Inpatient Hospital Stay (HOSPITAL_COMMUNITY): Payer: Medicare HMO | Admitting: Occupational Therapy

## 2020-03-22 ENCOUNTER — Inpatient Hospital Stay (HOSPITAL_COMMUNITY): Payer: Medicare HMO | Admitting: Physical Therapy

## 2020-03-22 ENCOUNTER — Inpatient Hospital Stay (HOSPITAL_COMMUNITY): Payer: Medicare HMO

## 2020-03-22 LAB — CBC
HCT: 32.2 % — ABNORMAL LOW (ref 36.0–46.0)
Hemoglobin: 9.8 g/dL — ABNORMAL LOW (ref 12.0–15.0)
MCH: 32.1 pg (ref 26.0–34.0)
MCHC: 30.4 g/dL (ref 30.0–36.0)
MCV: 105.6 fL — ABNORMAL HIGH (ref 80.0–100.0)
Platelets: 461 10*3/uL — ABNORMAL HIGH (ref 150–400)
RBC: 3.05 MIL/uL — ABNORMAL LOW (ref 3.87–5.11)
RDW: 20.9 % — ABNORMAL HIGH (ref 11.5–15.5)
WBC: 5.5 10*3/uL (ref 4.0–10.5)
nRBC: 0 % (ref 0.0–0.2)

## 2020-03-22 LAB — GLUCOSE, CAPILLARY
Glucose-Capillary: 119 mg/dL — ABNORMAL HIGH (ref 70–99)
Glucose-Capillary: 146 mg/dL — ABNORMAL HIGH (ref 70–99)
Glucose-Capillary: 89 mg/dL (ref 70–99)
Glucose-Capillary: 157 mg/dL — ABNORMAL HIGH (ref 70–99)

## 2020-03-22 LAB — APTT: aPTT: 55 seconds — ABNORMAL HIGH (ref 24–36)

## 2020-03-22 MED ORDER — RIVAROXABAN 20 MG PO TABS
20.0000 mg | ORAL_TABLET | Freq: Every day | ORAL | Status: DC
  Administered 2020-03-23 – 2020-03-29 (×7): 20 mg via ORAL
  Filled 2020-03-22 (×7): qty 1

## 2020-03-22 MED ORDER — METHYLPHENIDATE HCL 5 MG PO TABS
5.0000 mg | ORAL_TABLET | Freq: Two times a day (BID) | ORAL | Status: DC
  Administered 2020-03-22 – 2020-03-29 (×13): 5 mg via ORAL
  Filled 2020-03-22 (×14): qty 1

## 2020-03-22 MED ORDER — ENSURE ENLIVE PO LIQD
237.0000 mL | Freq: Two times a day (BID) | ORAL | Status: DC
  Administered 2020-03-22 – 2020-03-29 (×12): 237 mL via ORAL

## 2020-03-22 NOTE — Patient Care Conference (Signed)
Inpatient RehabilitationTeam Conference and Plan of Care Update Date: 03/22/2020   Time: 2:41 PM    Patient Name: [name]      Medical Record Number: [id number]  Date of Birth: January 10, 1946 Sex: Female         Room/Bed: 4W17C/4W17C-01 Payor Info: Payor: [employer] MEDICARE / Plan: [place] MEDICARE HMO/PPO / Product Type: *No Product type* /    Admit Date/Time:  03/10/2020  6:06 PM  Primary Diagnosis:  Nontraumatic subcortical hemorrhage of right cerebral hemisphere [facility])  Patient Active Problem List   Diagnosis Date Noted  . Sundowning   . Elevated BUN   . Peripheral edema   . Impulsive   . Hypoalbuminemia due to protein-calorie malnutrition ([place])   . Leucocytosis   . Dysphagia, post-stroke   . Anemia   . Prediabetes   . Steroid-induced hyperglycemia   . Embolic stroke ([place]) 0000000  . Dysphagia   . Vascular headache   . Thrombocytosis ([place])   . Venous thrombosis   . On amiodarone therapy   . Polycythemia   . Encounter for central line care   . Nontraumatic subcortical hemorrhage of right cerebral hemisphere ([place])   . Abnormal CT of the chest   . Atrial flutter with rapid ventricular response ([place])   . Pulmonary embolus ([place]) 02/22/2020  . Polycythemia vera ([place]) 02/17/2020  . Likely Cerebral amyloid angiopathy ([place]) 02/17/2020  . IVH (intraventricular hemorrhage) ([place]) 02/14/2020  . TIA (transient ischemic attack) 08/17/2014  . Acute sinusitis 08/16/2014  . Headache 08/15/2014  . Hypothyroidism 08/15/2014  . Hepatitis C   . Hyperlipidemia   . Hypertension     Expected Discharge Date: Expected Discharge Date: 03/29/20  Team Members Present: Physician leading conference: Dr. Alysia Penna Care Coodinator Present: Nestor Lewandowsky, RN, BSN, CRRN Nurse Present: Mohammed Kindle, RN PT Present: Phylliss Bob, PTA OT Present: Darleen Crocker, OT SLP Present: Charolett Bumpers, SLP PPS Coordinator present : Ileana Ladd, Burna Mortimer, SLP     Current  Status/Progress Goal Weekly Team Focus  Bowel/Bladder   Patient is incontinent of bowel and bladder.  LBM 03/21/20  obtain continence  q 2-3 hour toileting and prn assistance for toileting needs.   Swallow/Nutrition/ Hydration   dys 3 and thin. full supervision to increase PO intake. husband signed off for supervision  Min A  regular trials, increase PO intake and carryover of swallow strategies   ADL's   CGA bathing w/c level at sink sit<stand, Min A UB dressing, Mod A LB dressing, CGA ambulatory toilet trasnfer using rollator or RW, Min A toileting  Supervision overall  NMR, functional cognition/praxis, activity tolerance, standing balance, functional transfers, ADL retrianing   Mobility   supervision bed mobility, CGA/supervision transfers, gait CGA with rollator supervision with RW distance varies pending fatigue level, continues to demonstrate limited endurance  supervision overall with short distance gait, minA stairs  global strengthening, endurance, balance   Communication   Mod A, inconsistent but recently big changes more towards min A in last session, emerging verbal error awareness  Min A  naming task items/picture descriptions, writting, word finding strategies, following 2 step commands   Safety/Cognition/ Behavioral Observations  Min- Max A, average Mod A. increase sustained attention  Min A  sustained attention, basic problem solving and intellectual awareness   Pain   Patient denies pain.  Maintain 0 pain level with or without activity.  q shift and prn pain assessment.   Skin   Patient has non pitting edema in BLE, ecchymosis  in both arms.  Prevent skin breakdown and infection.       Rehab Goals Patient on target to meet rehab goals: Yes Rehab Goals Revised: Patient on target with current goals *See Care Plan and progress notes for long and short-term goals.     Barriers to Discharge  Current Status/Progress Possible Resolutions Date Resolved   Nursing       Incontinence     Timed Toileting       PT                    OT                  SLP                SW Medical stability sw aware of no barriers on target          Discharge Planning/Teaching Needs:  Goal to discharge home  Will schedule if recommended   Team Discussion:  Fatigue and poor endurance effecting progress;  Frequent rest breaks needed  Revisions to Treatment Plan:  Upgraded diet to D3/thin and plan d/c cortrak 03/23/20    Medical Summary Current Status: Speech improving still with confusion and poor orientation as well as lethargy and poor endurance Weekly Focus/Goal: Will be able to stop tube feedings, encourage fluid intake  Barriers to Discharge: Nutrition means;Medical stability   Possible Resolutions to Barriers: Continue rehab program   Continued Need for Acute Rehabilitation Level of Care: The patient requires daily medical management by a physician with specialized training in physical medicine and rehabilitation for the following reasons: Direction of a multidisciplinary physical rehabilitation program to maximize functional independence : Yes Medical management of patient stability for increased activity during participation in an intensive rehabilitation regime.: Yes Analysis of laboratory values and/or radiology reports with any subsequent need for medication adjustment and/or medical intervention. : Yes   I attest that I was present, lead the team conference, and concur with the assessment and plan of the team.   Dorien Chihuahua B 03/22/2020, 2:41 PM

## 2020-03-22 NOTE — Progress Notes (Signed)
Physical Therapy Session Note  Patient Details  Name: [name] MRN: [id number] Date of Birth: 15-Jun-1946  Today's Date: 03/22/2020 PT Individual Time: [phone] and 1305-1330 PT Individual Time Calculation (min): 58 min and 25 min  Short Term Goals: Week 2:  PT Short Term Goal 1 (Week 2): Pt will ambulate 86f with supervision assist and LRAD PT Short Term Goal 2 (Week 2): Pt will perform car transfer with min assist PT Short Term Goal 3 (Week 2): Pt will attend to task for up to 5 minutes with only moderate cues  Skilled Therapeutic Interventions/Progress Updates: Pt presented in recliner with family present eating breakfast agreeable to therapy. Discussed with family option of d/c early as reaching mobility goals. Also discussed progress has fluctuated greatly pending on fatigue levels. Family voiced understanding however requests to keep pt through 19th. PTA threaded pants for time management/energy conservation. Pt performed STS with supervision and required min verbal cue to pull up pants. Unable to don shirt due to active IV. Pt performed ambulatory transfer to w/c holding IV pole and CGA due to cord management. Pt transported to rehab gym with family joining. Participated in ascending/descending stairs x 4 with 1 rail and HHA both with PTA and husband with seated rest between bouts. Pt able to perform both with step through pattern and CGA overall. Discussed use of RW vs rollator upon d/c (use of rollator for energy conservation/seat vs already have RW) and also need for cues for rollator particularly with fatigue. Family requests continued use of RW. Pt then participated in LAQ 2 x 10 bilaterally then engaged in standing and seated ball toss for dynamic balance and sustained activity. Pt then ambulated 640fwith IV pole and CGA with w/c follow. Pt transported back to room at end of session and performed ambulatory transfer to bed holding IV pole. Performed bed mobility supervision due  to IV line management. Pt repositioned to comfort and left with bed alarm on, call bell within reach and needs met.   Tx2: Pt presented in bed awake/alert agreeable to therapy. Session focused on functional transfers including car transfer. Performed bed mobility with supervision due to IV line management. Pt noted not to have pants on, PTA threaded pants for energy conservation and pt performed STS supervision and pull pants over hips supervision. Pt then performed ambulatory transfer to w/c with IV pole and supervision. Pt transported to ortho gym and performed car transfer with CGA due to line management. Pt was able to step into sedan height vehicle without assistance and demonstrating good safety. Pt returned to w/c and transported back to room. Pt the able to express need for void, ambulated from EOB to toilet with IV pole and supervision with PTA assisting managing IV pole up lip into bathroom. Performed toilet transfers supervision (+void) however needed min A for clothing management when donning pants as forgot to pull up brief. Pt returned to bed in same manner as prior. Performed sit to supine on flat bed with supervision and repositioned to comfort. Pt left in bed with bed alarm on, call bell within reach and needs met.      Therapy Documentation Precautions:  Precautions Precautions: Fall Precaution Comments: cortrak Restrictions Weight Bearing Restrictions: No General:   Vital Signs: Oxygen Therapy SpO2: 98 % O2 Device: Room Air   Therapy/Group: Individual Therapy  Ryane Canavan  Tanisa Lagace, PTA  03/22/2020, 12:43 PM

## 2020-03-22 NOTE — Progress Notes (Signed)
ANTICOAGULATION CONSULT NOTE - Follow-Up  Pharmacy Consult for Argatroban Indication: HIT, Bilateral PE  Allergies  Allergen Reactions  . Erythromycin Itching and Other (See Comments)    Severe stomach pains, diarrhea  . Heparin     Likely HIT, SRA negative    Patient Measurements: Weight: 67.9 kg (149 lb 11.1 oz)  Vital Signs: Temp: 97.8 F (36.6 C) (05/12 1540) BP: 111/57 (05/12 1540) Pulse Rate: 86 (05/12 1540)  Labs: Recent Labs    03/20/20 0605 03/20/20 0605 03/21/20 0508 03/21/20 1444 03/22/20 0533  HGB 9.6*   < > 9.5*  --  9.8*  HCT 31.0*  --  30.8*  --  32.2*  PLT 509*  --  438*  --  461*  APTT 56*   < > 40* 51* 55*  CREATININE 0.90  --   --   --   --    < > = values in this interval not displayed.    Estimated Creatinine Clearance: 50.3 mL/min (by C-G formula based on SCr of 0.9 mg/dL).  Assessment: 74 year old  W initially admitted 4/4 with acute IVH and thrombocythemia with PLTC of 990k. Patient was ultimately diagnosed with polythemia vera, now s/p leukopheresis, therapeutic phlebotomy, and initiation of hydroxyurea. On 4/13 - found to have B/L PE - decision was risk of AC was too great and IVC placed. CT scan 4/15 showed extensive thrombus within confluence of sinuses and sent to IR with mechanical thrombectomy x5.  HIT antibody sent 4/15 and found to be positive, SRA negative 4/17. Per hematology continuing on DTI for now given slight risk of false negative with plans for DOAC once tolerating POs better. Per RN 5/11, patient is taking crushed meds in applesauce.   APTT in therapeutic range x2 on 0.8 mcg/kg/min.  H/H stable.  No signs of bleeding. Pt is taking crushed meds in applesauce, not whole meds. Of note, patient with very narrow initial therapeutic goal due to intraventricular hemorrhage 4/5. Last neurology and oncology notes stated ok to switch to treatment dose DOAC when stable. May be able to widen aPTT goal slightly given overall stability since  starting argatroban 02/25/20.   Pt is being transitioned to PO Xarelto in AM. Heme wants to dc argatroban within 1 hr of starting.   Goal of Therapy:  aPTT 50 - 60 seconds Monitor platelets by anticoagulation protocol: Yes   Plan: Dc argatroban at 0900 in AM Rivaroxaban 20mg  PO qday with breakfast  Onnie Boer, PharmD, [place], AAHIVP, CPP Infectious Disease Pharmacist 03/22/2020 6:56 PM

## 2020-03-22 NOTE — Progress Notes (Signed)
ANTICOAGULATION CONSULT NOTE - Follow-Up  Pharmacy Consult for Argatroban Indication: HIT, Bilateral PE  Allergies  Allergen Reactions  . Erythromycin Itching and Other (See Comments)    Severe stomach pains, diarrhea  . Heparin     Likely HIT, SRA negative    Patient Measurements: Weight: 67.9 kg (149 lb 11.1 oz)  Vital Signs: Temp: 98.5 F (36.9 C) (05/12 0310) Temp Source: Oral (05/12 0310) BP: 117/69 (05/12 0310) Pulse Rate: 84 (05/12 0310)  Labs: Recent Labs    03/20/20 0605 03/20/20 0605 03/21/20 0508 03/21/20 1444 03/22/20 0533  HGB 9.6*   < > 9.5*  --  9.8*  HCT 31.0*  --  30.8*  --  32.2*  PLT 509*  --  438*  --  461*  APTT 56*   < > 40* 51* 55*  CREATININE 0.90  --   --   --   --    < > = values in this interval not displayed.    Estimated Creatinine Clearance: 50.3 mL/min (by C-G formula based on SCr of 0.9 mg/dL).  Assessment: 74 year old  W initially admitted 4/4 with acute IVH and thrombocythemia with PLTC of 990k. Patient was ultimately diagnosed with polythemia vera, now s/p leukopheresis, therapeutic phlebotomy, and initiation of hydroxyurea. On 4/13 - found to have B/L PE - decision was risk of AC was too great and IVC placed. CT scan 4/15 showed extensive thrombus within confluence of sinuses and sent to IR with mechanical thrombectomy x5.  HIT antibody sent 4/15 and found to be positive, SRA negative 4/17. Per hematology continuing on DTI for now given slight risk of false negative with plans for DOAC once tolerating POs better. Per RN 5/11, patient is taking crushed meds in applesauce.   APTT in therapeutic range x2 on 0.8 mcg/kg/min.  H/H stable.  No signs of bleeding. Pt is taking crushed meds in applesauce, not whole meds. Of note, patient with very narrow initial therapeutic goal due to intraventricular hemorrhage 4/5. Last neurology and oncology notes stated ok to switch to treatment dose DOAC when stable. May be able to widen aPTT goal slightly  given overall stability since starting argatroban 02/25/20.    Goal of Therapy:  aPTT 50 - 60 seconds Monitor platelets by anticoagulation protocol: Yes   Plan: -Continue argatroban to 0.8 mcg/kg/min (3.6 ml/hr)  -Daily aPTT and CBC -Switch to Rivaroxaban when PO intake stable   Benetta Spar, PharmD, BCPS, [facility] Clinical Pharmacist  Please check AMION for all [place] phone numbers After 10:00 PM, call [place]

## 2020-03-22 NOTE — Progress Notes (Signed)
[place] PHYSICAL MEDICINE & REHABILITATION PROGRESS NOTE  Subjective/Complaints:  Now able to take meds whole  Loose stool x 2 , received TF last noc  PO solid intake is good  ROS: Limited due to cognitive/behavioral    Objective: Vital Signs: Blood pressure 117/69, pulse 84, temperature 98.5 F (36.9 C), temperature source Oral, resp. rate 18, weight 67.9 kg, SpO2 97 %. No results found. Recent Labs    03/21/20 0508 03/22/20 0533  WBC 5.1 5.5  HGB 9.5* 9.8*  HCT 30.8* 32.2*  PLT 438* 461*   Recent Labs    03/20/20 0605  NA 137  K 4.3  CL 105  CO2 22  GLUCOSE 123*  BUN 23  CREATININE 0.90  CALCIUM 9.3    Physical Exam: BP 117/69 (BP Location: Right Arm)   Pulse 84   Temp 98.5 F (36.9 C) (Oral)   Resp 18   Wt 67.9 kg   SpO2 97%   BMI 27.38 kg/m     General: No acute distress Mood and affect are appropriate Heart: Regular rate and rhythm no rubs murmurs or extra sounds Lungs: Clear to auscultation, breathing unlabored, no rales or wheezes Abdomen: Positive bowel sounds, soft nontender to palpation, nondistended Extremities: No clubbing, cyanosis, or edema Skin: No evidence of breakdown, no evidence of rash   Cerebellar exam normal finger to nose to finger as well as heel to shin in bilateral upper and lower extremities Musculoskeletal: Full range of motion in all 4 extremities. No joint swelling  5-/5 in BUE and BLE   Assessment/Plan: 1. Functional deficits secondary to R>L brain bleeds which require 3+ hours per day of interdisciplinary therapy in a comprehensive inpatient rehab setting.  Physiatrist is providing close team supervision and 24 hour management of active medical problems listed below.  Physiatrist and rehab team continue to assess barriers to discharge/monitor patient progress toward functional and medical goals  Care Tool:  Bathing    Body parts bathed by patient: Left arm, Chest, Abdomen, Front perineal area, Right upper  leg, Left upper leg, Face, Right lower leg, Right arm, Left lower leg, Buttocks   Body parts bathed by helper: Buttocks     Bathing assist Assist Level: Contact Guard/Touching assist     Upper Body Dressing/Undressing Upper body dressing   What is the patient wearing?: Pull over shirt    Upper body assist Assist Level: Minimal Assistance - Patient > 75%(to manage PICC)    Lower Body Dressing/Undressing Lower body dressing      What is the patient wearing?: Incontinence brief, Pants     Lower body assist Assist for lower body dressing: Moderate Assistance - Patient 50 - 74%     Toileting Toileting    Toileting assist Assist for toileting: Supervision/Verbal cueing     Transfers Chair/bed transfer  Transfers assist     Chair/bed transfer assist level: Independent with assistive device Chair/bed transfer assistive device: Programmer, multimedia   Ambulation assist      Assist level: Contact Guard/Touching assist Assistive device: (iv pole) Max distance: 48ft   Walk 10 feet activity   Assist     Assist level: Contact Guard/Touching assist Assistive device: Other (comment)(iv pole)   Walk 50 feet activity   Assist Walk 50 feet with 2 turns activity did not occur: Safety/medical concerns  Assist level: Contact Guard/Touching assist(iv pole) Assistive device: Rollator    Walk 150 feet activity   Assist Walk 150 feet activity did not occur:  Safety/medical concerns  Assist level: Supervision/Verbal cueing Assistive device: Rollator    Walk 10 feet on uneven surface  activity   Assist Walk 10 feet on uneven surfaces activity did not occur: Safety/medical concerns   Assist level: Supervision/Verbal cueing     Wheelchair     Assist Will patient use wheelchair at discharge?: No(No PT LTG set)   Wheelchair activity did not occur: Safety/medical concerns         Wheelchair 50 feet with 2 turns activity    Assist     Wheelchair 50 feet with 2 turns activity did not occur: Safety/medical concerns       Wheelchair 150 feet activity     Assist Wheelchair 150 feet activity did not occur: Safety/medical concerns          Medical Problem List and Plan: 1.  Deficits with mobility, self-care, cognition secondary to R>L brain bleeds.  Mainly cognitive dysfunction more so than weakness.  Also severe dysphagia  Continue CIR PT, OT, SLP 2.  PE/Antithrombotics: IVC to remain in place repeat scan in 3 months-anticoagulation:  Pharmaceutical: Other (comment)On Argatroban as pradaxa cannot be crushed.   able to swallow pills, defer to hematology in terms of switching anticoagulants- will notify Heme             -antiplatelet therapy: N/A 3. Heaches/Pain Management: On depakote tid for agitation as well as HA.     No c/os today              Monitor with increased emulation 4. Mood: LCSW to follow for evaluation and support.              -antipsychotic agents: N/A 5. Neuropsych: This patient is not capable of making decisions on her own behalf.  Telesitter for safety still required 6. Skin/Wound Care: Routine pressure relief measures. PICC site CDI  7. Fluids/Electrolytes/Nutrition: Monitor I/Os.             Advance diet as tolerated 8. 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 Decadron tapered to 4 mg bid on 04/27--slow taper to off.   9. Polycythemia Vera: JAK2 mutation positive. S/p Leukapheresis X 3. Hydrea prn to keep HCT<45  HCT 31 on 5/10 10. HIT:  SRA negative but HIT screen at [facility] strongly positive.   Thrombocytopenia resolved now with mild thrombo cytosis , Heme following 11. PAF/A fib with RVR:  Felt to be due to illness . Current recommendations are for [facility] for 6-9 months.   HR controlled 5/9             Monitor with increased activity 12.  Steroid-induced hyperglycemia resolved off steroids   CBG (last 3)  Recent Labs    03/21/20 1147 03/21/20 2143 03/22/20 0555  GLUCAP 149* 129* 119*  will be off TF do not anticipate need for SSI will monitor        will d/c novalog 5U with meals  13. Delirium:  With sundowning.Improving  On amantadine for activation and Depakote for agitation. Low dose Seroquel prn, none taken for >3d  No agitation 14. Anemia: Continue to monitor with daily labs.   Hemoglobin 8.6 5/4- stool guaic pending  Continue to monitor  5/8 hgb 9.4--->9.3 on 5/9  PPI 15.  Post stroke dysphagia  Dysphagia 3, thin liquids. Intake poor to borderline although may be improving over last 24  DC TF   5/10 BMET reviewed and WNL  Advance diet as tolerated 16.  Essential hypertension  Continue hydralazine 100 3 times daily  Continue losartan 50 twice daily   Vitals:   03/21/20 1943 03/22/20 0310  BP: 118/65 117/69  Pulse: 91 84  Resp: 18 18  Temp: 98.3 F (36.8 C) 98.5 F (36.9 C)  SpO2: 97% 97%  BP controlled     18.  Severe hypoalbuminemia  Supplement initiated- expect slow improvement      LOS: 12 days A FACE TO FACE EVALUATION WAS PERFORMED  Charlett Blake 03/22/2020, 8:41 AM

## 2020-03-22 NOTE — Plan of Care (Signed)
  Problem: RH BLADDER ELIMINATION Goal: RH STG MANAGE BLADDER WITH ASSISTANCE Description: STG Manage Bladder With Mod I Assistance Outcome: Progressing   Problem: RH SAFETY Goal: RH STG ADHERE TO SAFETY PRECAUTIONS W/ASSISTANCE/DEVICE Description: STG Adhere to Safety Precautions With Mod I Assistance/Device walker. Outcome: Progressing

## 2020-03-22 NOTE — Progress Notes (Signed)
Team Conference Report to Patient/Family  Team Conference discussion was reviewed with the patient and caregiver, including goals, any changes in plan of care and target discharge date of 03/29/20.  Patient and caregiver express understanding and are in agreement.  The patient has a target discharge date of 03/29/20.  [name] 03/22/2020, 2:38 PM

## 2020-03-22 NOTE — Progress Notes (Signed)
Speech Language Pathology Daily Session Note  Patient Details  Name: [name] MRN: [id number] Date of Birth: Aug 29, 1946  Today's Date: 03/22/2020 SLP Individual Time: [id number] SLP Individual Time Calculation (min): 43 min  Short Term Goals: Week 2: SLP Short Term Goal 1 (Week 2): Patient will continue to participate in water protocol with no overt s/sx of aspiration. SLP Short Term Goal 2 (Week 2): Patient will consume PO trials of dysphagia 3 textures with adequate mastication and oral clearance with no overt s/sx of aspiration. SLP Short Term Goal 3 (Week 2): Patient will name simple/fucntional objects with 80% accuracy and mod A multimodal cues. SLP Short Term Goal 4 (Week 2): Patient will write object name/function with 80% accuracy and mod A verbal cues. SLP Short Term Goal 5 (Week 2): Patient will idenitfy object in a field of two by name/written word with 70% accuracy and mod A verbal cues. SLP Short Term Goal 6 (Week 2): Pt will express wants/needs via multimodal communication (verbal, written, clarification with yes/no questions) with min A multimodal cues.  Skilled Therapeutic Interventions: Skilled ST services focused on cognitive skills. SLP facilitated sustain attention, basic problem solving and error awareness in card sorting task by color (with various shapes and numbers of shapes on card), pt required initial demonstration fading to supervision A verbal cues and then sorted by shape in a field of 3 with max A fade to min A verbal cues (3/4 through task) and then by a field of 2 with min A fade to supervision A verbal cues. Pt demonstrated sustained attention in 5 minute intervals, after 5 minute intervals pt required min A verbal cues in 10 minute intervals. Pt demonstrated ability to follow 2 step commands with 80% accuracy and 3 step commands with 25% accuracy given common objects. Pt demonstrated ability to recognize functional errors in less than 50% of opportunities.  Pt was left in room with call bell within reach and bed alarm set. ST recommends to continue skilled ST services.      Pain Pain Assessment Pain Score: 0-No pain  Therapy/Group: Individual Therapy  Skip Litke  [facility] 03/22/2020, 3:44 PM

## 2020-03-22 NOTE — Progress Notes (Signed)
Received note from Dr. Elnita Maxwell said the patient is now swallowing pills adequately.  We will start rivaroxaban tomorrow and stop the argatroban infusion at that time.  I will see her tomorrow morning to review this change with the patient and her family

## 2020-03-22 NOTE — Progress Notes (Signed)
Occupational Therapy Session Note  Patient Details  Name: [name] MRN: [id number] Date of Birth: [birth year]  Today's Date: 03/22/2020 OT Individual Time: [id number] OT Individual Time Calculation (min): 53 min    Short Term Goals: Week 2:  OT Short Term Goal 1 (Week 2): STGs=LTGs due to ELOS  Skilled Therapeutic Interventions/Progress Updates:    Upon entering the room, pt supine in bed with no c/o pain but reports fatigue. Pt required maximal encouragement for participation this session. Pt performed bed mobility with supervision. Pt ambulating with RW and CGA while therapist assists with IV pole into the bathroom. Pt performs clothing management and hygiene with close supervision. Pt doffs LB clothing items while seated on commode and ambulating to wheelchair in same manner. Pt engaged in bathing tasks with focus on sit <>stand at sink with CGA for balance. Pt donning brief and pants with CGA for balance with LB clothing management. Clean hospital gown donned secondary to IV medication at this time. Pt returning to bed with supervision. OT assisted with repositioning and provided supervision while pt taking a few sips of thin liquids. Bed alarm activated and call bell within reach upon exiting the room.   Therapy Documentation Precautions:  Precautions Precautions: Fall Precaution Comments: cortrak Restrictions Weight Bearing Restrictions: No General:   Vital Signs: Therapy Vitals Temp: 97.8 F (36.6 C) Pulse Rate: 86 Resp: 18 BP: (!) 111/57 Patient Position (if appropriate): Lying Oxygen Therapy SpO2: 100 % O2 Device: Room Air Pain: Pain Assessment Pain Score: 0-No pain ADL: ADL Grooming: Minimal cueing Where Assessed-Grooming: Sitting at sink Upper Body Bathing: Moderate cueing Where Assessed-Upper Body Bathing: Sitting at sink Lower Body Bathing: Maximal assistance Where Assessed-Lower Body Bathing: Sitting at sink, Standing at sink Upper Body Dressing:  Moderate assistance(for threadind d/t PICC and cortrak) Where Assessed-Upper Body Dressing: Sitting at sink Lower Body Dressing: Maximal assistance Where Assessed-Lower Body Dressing: Standing at sink, Sitting at sink Toileting: Maximal assistance Where Assessed-Toileting: Bedside Commode Toilet Transfer: Moderate assistance Toilet Transfer Method: Stand pivot Toilet Transfer Equipment: Bedside commode   Therapy/Group: Individual Therapy  [name] 03/22/2020, 5:24 PM

## 2020-03-22 NOTE — Progress Notes (Signed)
Nutrition Follow-up  DOCUMENTATION CODES:   Not applicable  INTERVENTION:   - Magic cup TID with meals, each supplement provides 290 kcal and 9 grams of protein  - Ensure Enlive po BID, each supplement provides 350 kcal and 20 grams of protein  - d/c free water flushes and Pro-stat  NUTRITION DIAGNOSIS:   Inadequate oral intake related to lethargy/confusion, dysphagia as evidenced by meal completion < 50%.  Progressing  GOAL:   Patient will meet greater than or equal to 90% of their needs  Progressing  MONITOR:   PO intake, Supplement acceptance, Diet advancement, Labs, Weight trends, I & O's  REASON FOR ASSESSMENT:   Consult Enteral/tube feeding initiation and management  ASSESSMENT:   74 year old female with PMH of HTN and hepatitis C. Pt admitted on 02/14/20 with onset of severe persistent HA and N/V. CT head done revealing acute IVH. Bone biopsy was positive for JAK2 confirming polycythemia vera. IVC filter was placed on 04/13 due to [place]. Pt underwent emergent thrombectomy with recanalization of deep system and decrease in clot burden. She continues on tube feeds for nutritional support. Pt admitted to CIR on 03/10/20.  5/08 - nocturnal TF rate reduced to stimulate appetite 5/11 - diet advanced to dysphagia 3 with thin liquids 5/12 - TF d/c  Noted plan to d/c Cortrak tomorrow. RD met with pt and encouraged continued PO and fluid intake. RD will order Ensure Enlive shakes with breakfast and dinner meals. Per RN, pt's husband is signed off to feed meals and comes for breakfast and dinner. This is when pt eats best.  Meal Completion: 25-90% x last 6 meals (improving)  Medications reviewed and include: Lasix, SSI, Ritalin, protonix, Klor-con, senna, argatroban  Labs reviewed: hemoglobin 9.8 CBG's: 119-146 x 24 hours  NUTRITION - FOCUSED PHYSICAL EXAM:    Most Recent Value  Orbital Region  No depletion  Upper Arm Region  Mild depletion  Thoracic and Lumbar Region   No depletion  Buccal Region  No depletion  Temple Region  Mild depletion  Clavicle Bone Region  Mild depletion  Clavicle and Acromion Bone Region  Mild depletion  Scapular Bone Region  No depletion  Dorsal Hand  No depletion  Patellar Region  No depletion  Anterior Thigh Region  Mild depletion  Posterior Calf Region  No depletion  Edema (RD Assessment)  Mild [BLE]  Hair  Reviewed  Eyes  Reviewed  Mouth  Reviewed  Skin  Reviewed  Nails  Reviewed       Diet Order:   Diet Order            DIET DYS 3 Room service appropriate? Yes; Fluid consistency: Thin  Diet effective now              EDUCATION NEEDS:   Not appropriate for education at this time  Skin:  Skin Assessment: Reviewed RN Assessment  Last BM:  03/22/20  Height:   Ht Readings from Last 1 Encounters:  02/14/20 '5\' 2"'  (1.575 m)    Weight:   Wt Readings from Last 1 Encounters:  03/20/20 67.9 kg    Ideal Body Weight:  50 kg  BMI:  Body mass index is 27.38 kg/m.  Estimated Nutritional Needs:   Kcal:  1700-1900  Protein:  85-100 grams  Fluid:  1.7-1.9 L    Gaynell Face, MS, RD, LDN Inpatient Clinical Dietitian Pager: [phone] Weekend/After Hours: [phone]

## 2020-03-23 ENCOUNTER — Inpatient Hospital Stay (HOSPITAL_COMMUNITY): Payer: Medicare HMO

## 2020-03-23 ENCOUNTER — Inpatient Hospital Stay (HOSPITAL_COMMUNITY): Payer: Medicare HMO | Admitting: Physical Therapy

## 2020-03-23 ENCOUNTER — Inpatient Hospital Stay (HOSPITAL_COMMUNITY): Payer: Medicare HMO | Admitting: Occupational Therapy

## 2020-03-23 LAB — GLUCOSE, CAPILLARY
Glucose-Capillary: 105 mg/dL — ABNORMAL HIGH (ref 70–99)
Glucose-Capillary: 108 mg/dL — ABNORMAL HIGH (ref 70–99)

## 2020-03-23 LAB — CBC
HCT: 30.4 % — ABNORMAL LOW (ref 36.0–46.0)
Hemoglobin: 9.1 g/dL — ABNORMAL LOW (ref 12.0–15.0)
MCH: 31.4 pg (ref 26.0–34.0)
MCHC: 29.9 g/dL — ABNORMAL LOW (ref 30.0–36.0)
MCV: 104.8 fL — ABNORMAL HIGH (ref 80.0–100.0)
Platelets: 433 10*3/uL — ABNORMAL HIGH (ref 150–400)
RBC: 2.9 MIL/uL — ABNORMAL LOW (ref 3.87–5.11)
RDW: 20.6 % — ABNORMAL HIGH (ref 11.5–15.5)
WBC: 4.9 10*3/uL (ref 4.0–10.5)
nRBC: 0 % (ref 0.0–0.2)

## 2020-03-23 NOTE — Progress Notes (Signed)
Occupational Therapy Session Note  Patient Details  Name: [name] MRN: [id number] Date of Birth: 23-Feb-1946  Today's Date: 03/23/2020 OT Individual Time: [id number] OT Individual Time Calculation (min): 70 min    Short Term Goals: Week 2:  OT Short Term Goal 1 (Week 2): STGs=LTGs due to ELOS  Skilled Therapeutic Interventions/Progress Updates:    Upon entering the room, pt seated in recliner chair with no c/o pain this session. Pt verbalizing need for toileting and ambulates 10' with CGA into bathroom. Pt able to void and have BM this session with CGA for balance while performing clothing management and hygiene. Pt exiting the room and seated in wheelchair at sink for grooming tasks with supervision only. No cuing needed for initiation or sequencing. OT washing pt's hair at sink and she was able to coordinate use of comb and hair dryer to dry and style hair. Pt does report fatigue at this point and taking rest break. Pt engaged in 3 sets of 10 chest pulls, shoulder elevation, and shoulder diagonal exercises with use of level 1 theraband and min cuing for proper technique. Pt then notified therapist of her need to use the bathroom again and transferred back to toilet in same manner above and was able to have second BM this session. Pt returning to bed secondary to fatigue at end of session with CGA and use of RW. Pt positioned for comfort with bed alarm activated and call bell within reach.   Therapy Documentation Precautions:  Precautions Precautions: Fall Precaution Comments: cortrak Restrictions Weight Bearing Restrictions: No   ADL: ADL Grooming: Minimal cueing Where Assessed-Grooming: Sitting at sink Upper Body Bathing: Moderate cueing Where Assessed-Upper Body Bathing: Sitting at sink Lower Body Bathing: Maximal assistance Where Assessed-Lower Body Bathing: Sitting at sink, Standing at sink Upper Body Dressing: Moderate assistance(for threadind d/t PICC and  cortrak) Where Assessed-Upper Body Dressing: Sitting at sink Lower Body Dressing: Maximal assistance Where Assessed-Lower Body Dressing: Standing at sink, Sitting at sink Toileting: Maximal assistance Where Assessed-Toileting: Bedside Commode Toilet Transfer: Moderate assistance Toilet Transfer Method: Stand pivot Toilet Transfer Equipment: Bedside commode   Therapy/Group: Individual Therapy  [name] 03/23/2020, 12:21 PM

## 2020-03-23 NOTE — Progress Notes (Signed)
[place] PHYSICAL MEDICINE & REHABILITATION PROGRESS NOTE  Subjective/Complaints: Discussed care with Pt son and husband Appreciate heme note Good oral intake, swallows pills whole  Fluid intake still poor   ROS: Limited due to cognitive/behavioral    Objective: Vital Signs: Blood pressure 127/65, pulse 88, temperature 97.7 F (36.5 C), temperature source Oral, resp. rate 18, weight 68.7 kg, SpO2 96 %. No results found. Recent Labs    03/22/20 0533 03/23/20 0449  WBC 5.5 4.9  HGB 9.8* 9.1*  HCT 32.2* 30.4*  PLT 461* 433*   No results for input(s): NA, K, CL, CO2, GLUCOSE, BUN, CREATININE, CALCIUM in the last 72 hours.  Physical Exam: BP 127/65 (BP Location: Right Arm)   Pulse 88   Temp 97.7 F (36.5 C) (Oral)   Resp 18   Wt 68.7 kg   SpO2 96%   BMI 27.70 kg/m      HEENT- + Cortrak General: No acute distress Mood and affect are appropriate Heart: Regular rate and rhythm no rubs murmurs or extra sounds Lungs: Clear to auscultation, breathing unlabored, no rales or wheezes Abdomen: Positive bowel sounds, soft nontender to palpation, nondistended Extremities: No clubbing, cyanosis, or edema Skin: No evidence of breakdown, no evidence of rash PICC Site CDI Musculoskeletal: Full range of motion in all 4 extremities. No joint swelling  5-/5 in BUE and BLE   Assessment/Plan: 1. Functional deficits secondary to R>L brain bleeds which require 3+ hours per day of interdisciplinary therapy in a comprehensive inpatient rehab setting.  Physiatrist is providing close team supervision and 24 hour management of active medical problems listed below.  Physiatrist and rehab team continue to assess barriers to discharge/monitor patient progress toward functional and medical goals  Care Tool:  Bathing    Body parts bathed by patient: Left arm, Chest, Abdomen, Front perineal area, Right upper leg, Left upper leg, Face, Right lower leg, Right arm, Left lower leg, Buttocks    Body parts bathed by helper: Buttocks     Bathing assist Assist Level: Contact Guard/Touching assist     Upper Body Dressing/Undressing Upper body dressing   What is the patient wearing?: Hospital gown only    Upper body assist Assist Level: Set up assist    Lower Body Dressing/Undressing Lower body dressing      What is the patient wearing?: Incontinence brief, Pants     Lower body assist Assist for lower body dressing: Contact Guard/Touching assist     Toileting Toileting    Toileting assist Assist for toileting: Contact Guard/Touching assist     Transfers Chair/bed transfer  Transfers assist     Chair/bed transfer assist level: Contact Guard/Touching assist Chair/bed transfer assistive device: Programmer, multimedia   Ambulation assist      Assist level: Contact Guard/Touching assist Assistive device: Other (comment)(iv pole) Max distance: 44ft   Walk 10 feet activity   Assist     Assist level: Contact Guard/Touching assist Assistive device: Other (comment)   Walk 50 feet activity   Assist Walk 50 feet with 2 turns activity did not occur: Safety/medical concerns  Assist level: Contact Guard/Touching assist Assistive device: Rollator    Walk 150 feet activity   Assist Walk 150 feet activity did not occur: Safety/medical concerns  Assist level: Supervision/Verbal cueing Assistive device: Rollator    Walk 10 feet on uneven surface  activity   Assist Walk 10 feet on uneven surfaces activity did not occur: Safety/medical concerns   Assist level: Supervision/Verbal cueing  Wheelchair     Assist Will patient use wheelchair at discharge?: No(No PT LTG set)   Wheelchair activity did not occur: Safety/medical concerns         Wheelchair 50 feet with 2 turns activity    Assist    Wheelchair 50 feet with 2 turns activity did not occur: Safety/medical concerns       Wheelchair 150 feet activity      Assist Wheelchair 150 feet activity did not occur: Safety/medical concerns          Medical Problem List and Plan: 1.  Deficits with mobility, self-care, cognition secondary to R>L brain bleeds.  Mainly cognitive dysfunction more so than weakness.  Also severe dysphagia  Continue CIR PT, OT, SLP 2.  PE/Antithrombotics: IVC to remain in place repeat scan in 3 months-anticoagulation:  Pharmaceutical: Other (comment)per heme will switch to Xarelto today              -antiplatelet therapy: N/A 3. Heaches/Pain Management: On depakote tid for agitation as well as HA.     No c/os today              Monitor with increased emulation 4. Mood: LCSW to follow for evaluation and support.              -antipsychotic agents: N/A 5. Neuropsych: This patient is not capable of making decisions on her own behalf.  Telesitter for safety still required 6. Skin/Wound Care: Routine pressure relief measures. PICC site CDI  7. Fluids/Electrolytes/Nutrition: Monitor I/Os.             Advance diet as tolerated 8. 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 Decadron tapered to 4 mg bid on 04/27--slow taper to off.   9. Polycythemia Vera: JAK2 mutation positive. S/p Leukapheresis X 3. Hydrea prn to keep HCT<45  HCT 31 on 5/10, 30.4 on 5/13 10. HIT:  SRA negative but HIT screen at [facility] strongly positive.   Thrombocytopenia resolved now with mild thrombo cytosis , Heme following 11. PAF/A fib with RVR:  Felt to be due to illness . Current recommendations are for [facility] for 6-9 months.   HR controlled             Monitor with increased activity 12.  Steroid-induced hyperglycemia resolved off steroids  CBG (last 3)  Recent Labs    03/22/20 1638 03/22/20 2127 03/23/20 0628  GLUCAP 89 157* 105*  off TF may d/c CBG  13. Delirium:  With sundowning.Improving  On amantadine for activation and Depakote for agitation.  Low dose Seroquel prn, none taken for >3d  No agitation 14. Anemia: Continue to monitor with daily labs.   Hemoglobin 8.6 5/4- stool guaic pending  Continue to monitor  >9.3 on 5/9  PPI 15.  Post stroke dysphagia  Dysphagia 3, thin liquids. Intake poor to borderline although may be improving over last 24  DC TF   5/10 BMET reviewed and WNL  Advance diet as tolerated 16.  Essential hypertension  Continue hydralazine 100 3 times daily  Continue losartan 50 twice daily   Vitals:   03/23/20 0349 03/23/20 0726  BP: 127/65   Pulse: 88   Resp: 18   Temp: 97.7 F (36.5 C)   SpO2: 99% 96%  BP controlled     18.  Severe hypoalbuminemia  Supplement initiated- expect slow improvement      LOS: 13 days A FACE TO  [place] 03/23/2020, 8:19 AM

## 2020-03-23 NOTE — Progress Notes (Signed)
Physical Therapy Session Note  Patient Details  Name: [name] MRN: [id number] Date of Birth: 1946/04/04  Today's Date: 03/23/2020 PT Individual Time: 1620-1728 PT Individual Time Calculation (min): 68 min   Short Term Goals: Week 2:  PT Short Term Goal 1 (Week 2): Pt will ambulate 36f with supervision assist and LRAD PT Short Term Goal 2 (Week 2): Pt will perform car transfer with min assist PT Short Term Goal 3 (Week 2): Pt will attend to task for up to 5 minutes with only moderate cues  Skilled Therapeutic Interventions/Progress Updates:   Pt received supine in bed and agreeable to PT. Supine>sit transfer without assist or cues. Pt performed ambulatory [facility] and [facility]   Gait training in rehab gym without AD x 1230fwith supervision assist. Additional dynamic balance gait training with and without AD to weave through 8 cones x 2 with CGA with intermittent LOB with turns to the R; pt then performed with RW and supervision assist 2 x 8 cones. Pt also instructed in stepping over 6 1 inch obstacles to simulate thresholds with RW and then without AD. CGA overall with cues for AD management and safety in turns.    Nustep endurance and sustained attention task x 8 minutes, level 5. Encouragement form son and therapist throughout to continue, as pt reporting increased LE fatigue.     dynamic balance training standing on Airex pad while engage Wii  bowling fine motor task, pt able to maintain balance with min assist to prevent posterior LOB for 5 frames, and 2 frames before requesting rest break.  Additional gait training back to room with RW x [id number] supervision assist. 3 standing rest breaks, as pt reports LPB with increasing distance. Pt returned to room and performed ambulatory transfer to bed with RW and supervision assist. Sit>supine completed without assist, and left supine in bed with call bell in reach and all needs met.         Therapy  Documentation Precautions:  Precautions Precautions: Fall Precaution Comments: cortrak Restrictions Weight Bearing Restrictions: No    Vital Signs: Therapy Vitals Temp: 98.6 F (37 C) Pulse Rate: 92 Resp: 16 BP: (!) 108/49 Patient Position (if appropriate): Lying Oxygen Therapy SpO2: 97 % O2 Device: Room Air Pain: Pain Assessment Pain Score: 0-No pain    Therapy/Group: Individual Therapy  AusLorie Phenix13/2021, 6:10 PM

## 2020-03-23 NOTE — Progress Notes (Signed)
[name]   DOB:22-May-1946   IP#:[id number]   QBH#:[id number]  Subjective:  [name] continues to make progress and now is swallowing pills without any difficulty.  Accordingly we are going to start her on rivaroxaban today.  Her argatroban will be stopped at the same time.  [name] tells me that she did not walk as much as she wanted yesterday but that she did get around.  She still has the NG tube in place and "cannot wait to have it pulled out".  Of course she is agitating to go home.  Husband in room.  Objective:  Vitals:   03/23/20 0349 03/23/20 0726  BP: 127/65   Pulse: 88   Resp: 18   Temp: 97.7 F (36.5 C)   SpO2: 99% 96%    Body mass index is 27.7 kg/m.  Intake/Output Summary (Last 24 hours) at 03/23/2020 1301 Last data filed at 03/22/2020 1818 Gross per 24 hour  Intake 360 ml  Output --  Net 360 ml   Sclerae unicteric, EOMs intact NG in place Lungs no rales or rhonchi, auscultated anterolaterally Heart regular rate and rhythm Abd soft, nontender, positive bowel sounds MSK still has grade 1 bilateral lower extremity edema, no erythema; this appears improved Neuro: nonfocal, well oriented, appropriate affect Breasts: Deferred    Ref Range & Units 7 d ago 2 wk ago  Heparin Induced Plt Ab 0.000 - 0.400 OD 1.469High   2.553High  CM        CBG (last 3)  Recent Labs    03/22/20 2127 03/23/20 0628 03/23/20 1241  GLUCAP 157* 105* 108*     Labs:  Lab Results  Component Value Date   WBC 4.9 03/23/2020   HGB 9.1 (L) 03/23/2020   HCT 30.4 (L) 03/23/2020   MCV 104.8 (H) 03/23/2020   PLT 433 (H) 03/23/2020   NEUTROABS 11.4 (H) 03/09/2020    '@LASTCHEMISTRY' @  Urine Studies No results for input(s): UHGB, CRYS in the last 72 hours.  Invalid input(s): UACOL, UAPR, USPG, UPH, UTP, UGL, UKET, UBIL, UNIT, UROB, [place], UEPI, UWBC, Junie Panning [place], [place], [place]  Basic Metabolic Panel: Recent Labs  Lab 03/17/20 0754 03/17/20 0754 03/18/20 0508 03/18/20 0508  03/19/20 0729 03/20/20 0605  NA 131*  --  133*  --  135 137  K 4.1   < > 4.2   < > 4.2 4.3  CL 103  --  100  --  103 105  CO2 22  --  23  --  23 22  GLUCOSE 136*  --  144*  --  110* 123*  BUN 20  --  23  --  19 23  CREATININE 0.80  --  1.03*  --  0.84 0.90  CALCIUM 8.4*  --  9.2  --  9.1 9.3   < > = values in this interval not displayed.   GFR Estimated Creatinine Clearance: 50.5 mL/min (by C-G formula based on SCr of 0.9 mg/dL). Liver Function Tests: No results for input(s): AST, ALT, ALKPHOS, BILITOT, PROT, ALBUMIN in the last 168 hours. No results for input(s): LIPASE, AMYLASE in the last 168 hours. No results for input(s): AMMONIA in the last 168 hours. Coagulation profile No results for input(s): INR, PROTIME in the last 168 hours.  CBC: Recent Labs  Lab 03/19/20 0729 03/20/20 0605 03/21/20 0508 03/22/20 0533 03/23/20 0449  WBC 6.6 5.9 5.1 5.5 4.9  HGB 9.3* 9.6* 9.5* 9.8* 9.1*  HCT 29.8* 31.0* 30.8* 32.2* 30.4*  MCV  101.4* 102.0* 102.3* 105.6* 104.8*  PLT 486* 509* 438* 461* 433*   Cardiac Enzymes: No results for input(s): CKTOTAL, CKMB, CKMBINDEX, TROPONINI in the last 168 hours. BNP: Invalid input(s): POCBNP CBG: Recent Labs  Lab 03/22/20 1144 03/22/20 1638 03/22/20 2127 03/23/20 0628 03/23/20 1241  GLUCAP 146* 89 157* 105* 108*   D-Dimer No results for input(s): DDIMER in the last 72 hours. Hgb A1c No results for input(s): HGBA1C in the last 72 hours. Lipid Profile Recent Labs    03/21/20 0508  TRIG 111   Thyroid function studies No results for input(s): TSH, T4TOTAL, T3FREE, THYROIDAB in the last 72 hours.  Invalid input(s): FREET3 Anemia work up No results for input(s): VITAMINB12, FOLATE, FERRITIN, TIBC, IRON, RETICCTPCT in the last 72 hours. Microbiology No results found for this or any previous visit (from the past 240 hour(s)).    Studies:  No results found.  Assessment/plan: 74 y.o. [place] woman presenting 02/13/2020 with  headache, found to have biventricular bleeds in the setting of panmyelosis, subsequently with pulmonary emboli and sinus venous thromboses in the setting of heparin induced thrombocytopenia   (1) polycythemia vera  (a) JAK2 mutation positive confirming diagnosis  (b) bone marrow biopsy 02/13/2020 shows no evidence of leukemia, c/w P Vera  (c)  leukapheresis x3 . Last leukapheresis Friday 02/18/2020   (i) HD catheter removed after pheresis on  02/18/2020  (d) hydrea as needed to keep HCT <45 and platelets in normal range   (2) heparin induced thrombocytopenia: positive HIT screen x2  (a) severe thrombocythemia noted 02/24/2020  (b) bilateral pulmonary emboli 02/22/2020   (i) s/p IVC filter placement 02/22/2020   (ii) atrial fibrillation with RVR  (c) venous sinus thromboses 02/24/2020   (i) s/p mechanical thrombectomy  (d) argatroban started 02/25/2020  (e) platelet count responded to heparin withdrawal  (f) confirmatory serotonin release assay negative (see discussion below)   (i) continue argatroban for anticoagulation, no heparin   (ii) repeat HIT screen at [facility] lab again strongly positive  (g) platelets normalized as of 03/08/2020  (3) drug-induced thrombocytopenia  (a) discontinued amiodarone, inessential meds   (i) remains in sinus  (b) resolved-- platelets now elevated  (4) anemia: reticulocytes increasing; no folate, B-12 or iron deficiency; normal renal function; LDH slightly elevated but negative DAT and normal t bil (no evidence of hemolysis)--likely patient has occult bleeding--resolving--continue protonix  Plan: [name] continues to make very satisfactory progress and now she has a tentative discharge date on 03/29/2020.  The family is very pleased with the care she is receiving in rehab.  Am delighted that we can stop the argatroban infusion and hopefully she can have the NG tube pulled today.  I will make an enormous difference to her psychologically.  She  remains anemic, although that is not worsening.  I am going to repeat a reticulocyte count tomorrow but I think this is just a matter of time.  I do not think the Hydrea is significantly delaying resolution of this issue  We will continue to follow with you Chauncey Cruel, MD 03/23/2020  1:01 PM Medical Oncology and Hematology [facility] [place], [place] [address] Tel. [phone]    Fax. [phone]   GM

## 2020-03-23 NOTE — Progress Notes (Signed)
Speech Language Pathology Weekly Progress and Session Note  Patient Details  Name: [name] MRN: [id number] Date of Birth: 06/29/1946  Beginning of progress report period: Mar 17, 2020 End of progress report period: Mar 23, 2020  Today's Date: 03/23/2020 SLP Individual Time: 2035-5974 SLP Individual Time Calculation (min): 40 min  Short Term Goals: Week 2: SLP Short Term Goal 1 (Week 2): Patient will continue to participate in water protocol with no overt s/sx of aspiration. SLP Short Term Goal 1 - Progress (Week 2): Met SLP Short Term Goal 2 (Week 2): Patient will consume PO trials of dysphagia 3 textures with adequate mastication and oral clearance with no overt s/sx of aspiration. SLP Short Term Goal 2 - Progress (Week 2): Met SLP Short Term Goal 3 (Week 2): Patient will name simple/fucntional objects with 80% accuracy and mod A multimodal cues. SLP Short Term Goal 3 - Progress (Week 2): Met SLP Short Term Goal 4 (Week 2): Patient will write object name/function with 80% accuracy and mod A verbal cues. SLP Short Term Goal 4 - Progress (Week 2): Met SLP Short Term Goal 5 (Week 2): Patient will idenitfy object in a field of two by name/written word with 70% accuracy and mod A verbal cues. SLP Short Term Goal 5 - Progress (Week 2): Met SLP Short Term Goal 6 (Week 2): Pt will express wants/needs via multimodal communication (verbal, written, clarification with yes/no questions) with min A multimodal cues. SLP Short Term Goal 6 - Progress (Week 2): Met    New Short Term Goals: Week 3: SLP Short Term Goal 1 (Week 3): STG=LTG due to short ELOS  Weekly Progress Updates: Pt made great progress meeting 6 out 6 goals for this reporting period with a dramatic increase in expressive/recpetive language abilities. Expressive language: pt demonstrated ability to name common objects with 100% accuracy given mod A semantic and occasional binary choice cues, describe simple pictures at phrase  level with 100% accuracy and supervision A verbal cues for use of specific language, write up to 4 word phrases to describe picture cards with x 1 spelling error (no awareness), write pt's address and express verbal wants/needs phrase/sentence level with supervision A verbal cues for clarification. Receptive language: pt demonstrated ability to identity common objects by name and then written word with 100% accuracy, identify objects in a field of three when given cloze phrases with 100% accuracy  with mod A verbal cues for comprehension of directions, follow 2 step directions with 80% accuracy and 3 step directions with 25% accuracy. Pt demonstrated increase in naming deficits with an increase in complexity of task ( for example; matching objects to word or phrase.)  Pt demonstrated slow increase of problem solving abilities ranging from min-max A, averaging moderate abilities and cuing required with increase sustained attention in 5-10 intervals. Pt continues to demonstrate fluctuating abilities impacted by fatigue. Pt was upgraded to thin liquids and regular textures. The last reporting period prior to discharge, SLP will focus on tolerance of diet, basic problem solving, sustained attention and continued expressive/recpetive language growth. Pt would continue to benefit from skills ST services in order to maximumize functional indepdence and reduce burden of care, requring 24 hour supervision and continued ST services     Intensity: Minumum of 1-2 x/day, 30 to 90 minutes Frequency: 3 to 5 out of 7 days Duration/Length of Stay: 5/19 Treatment/Interventions: Cueing hierarchy;Multimodal communication approach;Speech/Language facilitation;Internal/external aids;Therapeutic Exercise;Patient/family education;Dysphagia/aspiration precaution training   Daily Session  Skilled Therapeutic Interventions:  Skilled ST services focused on swallow and cognitigve skills. SLP facilitated PO consumption of regular  textures and medication whole with thin liquids via straw (nurse present.) Pt demonstrated appropriate oral clearance, functional swallow and no overt s/s aspiration. SLP upgraded diet to regular textures and medication whole with thin liquids, as well as intermittent supervision due to improved cognition. SLP recommends follow up treatment session for diet tolerance, piror to discharge from dysphagia goals due to impairment largely being of cognitive nature. Pt demonstrated recall of yesterday's ST activities with supervision A verbal cues. SLP also facilitated basic problem solving and sustained attention in novel card task (Blink), pt required min A verbal cues for problem solving and sustained attention in the first 10 minutes, however after this time pt required max A verbal/visual cues.  Pt was left in room with call bell within reach and bed alarm set. ST recommends to continue skilled ST services.     General    Pain Pain Assessment Pain Score: 0-No pain  Therapy/Group: Individual Therapy  Lashann Hagg  [facility] 03/23/2020, 4:44 PM

## 2020-03-23 NOTE — Progress Notes (Signed)
Per family patient does not normally eat a lot at home.

## 2020-03-23 NOTE — Discharge Instructions (Addendum)
Inpatient Rehab Discharge Instructions  [name] Discharge date and time:  03/29/20   Activities/Precautions/ Functional Status: Activity: no lifting, driving, or strenuous exercise till cleared by MD Diet: low fat, low cholesterol diet Wound Care: none needed    Functional status:  ___ No restrictions     ___ Walk up steps independently _X__ 24/7 supervision/assistance   ___ Walk up steps with assistance ___ Intermittent supervision/assistance  ___ Bathe/dress independently ___ Walk with walker     _X__ Bathe/dress with assistance ___ Walk Independently    ___ Shower independently _X__ Walk with supervision     ___ Shower with assistance _X__ No alcohol     ___ Return to work/school ________   COMMUNITY REFERRALS UPON DISCHARGE:    Home Health:   PT     OT    ST                       Agency: [facility]  [phone]    Special Instructions: 1. Family needs to assist with medication management.   STROKE/TIA DISCHARGE INSTRUCTIONS SMOKING Cigarette smoking nearly doubles your risk of having a stroke & is the single most alterable risk factor  If you smoke or have smoked in the last 12 months, you are advised to quit smoking for your health.  Most of the excess cardiovascular risk related to smoking disappears within a year of stopping.  Ask you doctor about anti-smoking medications  [place] Quit Line: 1-800-QUIT NOW  Free Smoking Cessation Classes [phone]  CHOLESTEROL Know your levels; limit fat & cholesterol in your diet  Lipid Panel     Component Value Date/Time   CHOL 172 02/14/2020 0959   TRIG 81 03/27/2020 0517   HDL 71 02/14/2020 0959   CHOLHDL 2.4 02/14/2020 0959   VLDL 9 02/14/2020 0959   LDLCALC 92 02/14/2020 0959      Many patients benefit from treatment even if their cholesterol is at goal.  Goal: Total Cholesterol (CHOL) less than 160  Goal:  Triglycerides (TRIG) less than 150  Goal:  HDL greater than 40  Goal:  LDL (LDLCALC) less  than 100   BLOOD PRESSURE American Stroke Association blood pressure target is less that 120/80 mm/Hg  Your discharge blood pressure is:  BP: (!) 110/45  Monitor your blood pressure  Limit your salt and alcohol intake  Many individuals will require more than one medication for high blood pressure  DIABETES (A1c is a blood sugar average for last 3 months) Goal HGBA1c is under 7% (HBGA1c is blood sugar average for last 3 months)  Diabetes: Pre-diabetes    Lab Results  Component Value Date   HGBA1C 6.1 (H) 02/14/2020     Your HGBA1c can be lowered with medications, healthy diet, and exercise.  Check your blood sugar as directed by your physician  Call your physician if you experience unexplained or low blood sugars.  PHYSICAL ACTIVITY/REHABILITATION Goal is 30 minutes at least 4 days per week  Activity: No driving, Therapies: see above Return to work: N/A  Activity decreases your risk of heart attack and stroke and makes your heart stronger.  It helps control your weight and blood pressure; helps you relax and can improve your mood.  Participate in a regular exercise program.  Talk with your doctor about the best form of exercise for you (dancing, walking, swimming, cycling).  DIET/WEIGHT Goal is to maintain a healthy weight  Your discharge diet is:  Diet Order  Diet Heart Healthy Room service appropriate? Yes; Fluid consistency: Thin  Diet effective now             liquids Your height is:  5'2" Your current weight is: 147 lbs Your Body Mass Index (BMI) is:  BMI (Calculated): 28.06  Following the type of diet specifically designed for you will help prevent another stroke.  Your goal weight is: 136 lbs  Your goal Body Mass Index (BMI) is 19-24.  Healthy food habits can help reduce 3 risk factors for stroke:  High cholesterol, hypertension, and excess weight.  RESOURCES Stroke/Support Group:  Call [phone]   STROKE EDUCATION PROVIDED/REVIEWED AND GIVEN  TO PATIENT Stroke warning signs and symptoms How to activate emergency medical system (call 911). Medications prescribed at discharge. Need for follow-up after discharge. Personal risk factors for stroke. Pneumonia vaccine given:  Flu vaccine given:  My questions have been answered, the writing is legible, and I understand these instructions.  I will adhere to these goals & educational materials that have been provided to me after my discharge from the hospital.       My questions have been answered and I understand these instructions. I will adhere to these goals and the provided educational materials after my discharge from the hospital.  Patient/Caregiver Signature _______________________________ Date __________  Clinician Signature _______________________________________ Date __________  Please bring this form and your medication list with you to all your follow-up doctor's appointments.  Information on my medicine - XARELTO (rivaroxaban)  This medication education was reviewed with me or my healthcare representative as part of my discharge preparation.   WHY WAS XARELTO PRESCRIBED FOR YOU? Xarelto was prescribed to treat blood clots that may have been found in the veins of your legs (deep vein thrombosis) or in your lungs (pulmonary embolism) and to reduce the risk of them occurring again.  What do you need to know about Xarelto? The dose is one 20 mg tablet taken ONCE A DAY with breakfast.  DO NOT stop taking Xarelto without talking to the health care provider who prescribed the medication.  Refill your prescription for 20 mg tablets before you run out.  After discharge, you should have regular check-up appointments with your healthcare provider that is prescribing your Xarelto.  In the future your dose may need to be changed if your kidney function changes by a significant amount.  What do you do if you miss a dose? If you are taking Xarelto TWICE DAILY and you miss a  dose, take it as soon as you remember. You may take two 15 mg tablets (total 30 mg) at the same time then resume your regularly scheduled 15 mg twice daily the next day.  If you are taking Xarelto ONCE DAILY and you miss a dose, take it as soon as you remember on the same day then continue your regularly scheduled once daily regimen the next day. Do not take two doses of Xarelto at the same time.   Important Safety Information Xarelto is a blood thinner medicine that can cause bleeding. You should call your healthcare provider right away if you experience any of the following: ? Bleeding from an injury or your nose that does not stop. ? Unusual colored urine (red or dark brown) or unusual colored stools (red or black). ? Unusual bruising for unknown reasons. ? A serious fall or if you hit your head (even if there is no bleeding).  Some medicines may interact with Xarelto and might increase your risk  of bleeding while on Xarelto. To help avoid this, consult your healthcare provider or pharmacist prior to using any new prescription or non-prescription medications, including herbals, vitamins, non-steroidal anti-inflammatory drugs (NSAIDs) and supplements.  This website has more information on Xarelto: [email or web address]  ============================================================================================================

## 2020-03-24 ENCOUNTER — Inpatient Hospital Stay (HOSPITAL_COMMUNITY): Payer: Medicare HMO | Admitting: Occupational Therapy

## 2020-03-24 ENCOUNTER — Inpatient Hospital Stay (HOSPITAL_COMMUNITY): Payer: Medicare HMO | Admitting: Physical Therapy

## 2020-03-24 DIAGNOSIS — D649 Anemia, unspecified: Secondary | ICD-10-CM | POA: Diagnosis not present

## 2020-03-24 DIAGNOSIS — E782 Mixed hyperlipidemia: Secondary | ICD-10-CM | POA: Diagnosis not present

## 2020-03-24 DIAGNOSIS — I10 Essential (primary) hypertension: Secondary | ICD-10-CM | POA: Diagnosis not present

## 2020-03-24 DIAGNOSIS — M810 Age-related osteoporosis without current pathological fracture: Secondary | ICD-10-CM | POA: Diagnosis not present

## 2020-03-24 DIAGNOSIS — I610 Nontraumatic intracerebral hemorrhage in hemisphere, subcortical: Secondary | ICD-10-CM | POA: Diagnosis not present

## 2020-03-24 DIAGNOSIS — E039 Hypothyroidism, unspecified: Secondary | ICD-10-CM | POA: Diagnosis not present

## 2020-03-24 DIAGNOSIS — I639 Cerebral infarction, unspecified: Secondary | ICD-10-CM | POA: Diagnosis not present

## 2020-03-24 DIAGNOSIS — J449 Chronic obstructive pulmonary disease, unspecified: Secondary | ICD-10-CM | POA: Diagnosis not present

## 2020-03-24 DIAGNOSIS — M1812 Unilateral primary osteoarthritis of first carpometacarpal joint, left hand: Secondary | ICD-10-CM | POA: Diagnosis not present

## 2020-03-24 LAB — CBC
HCT: 29.8 % — ABNORMAL LOW (ref 36.0–46.0)
Hemoglobin: 9.1 g/dL — ABNORMAL LOW (ref 12.0–15.0)
MCH: 31.9 pg (ref 26.0–34.0)
MCHC: 30.5 g/dL (ref 30.0–36.0)
MCV: 104.6 fL — ABNORMAL HIGH (ref 80.0–100.0)
Platelets: 438 10*3/uL — ABNORMAL HIGH (ref 150–400)
RBC: 2.85 MIL/uL — ABNORMAL LOW (ref 3.87–5.11)
RDW: 20.8 % — ABNORMAL HIGH (ref 11.5–15.5)
WBC: 6.1 10*3/uL (ref 4.0–10.5)
nRBC: 0 % (ref 0.0–0.2)

## 2020-03-24 LAB — TRIGLYCERIDES: Triglycerides: 83 mg/dL (ref <150)

## 2020-03-24 LAB — RETICULOCYTES
Immature Retic Fract: 16.9 % — ABNORMAL HIGH (ref 2.3–15.9)
RBC.: 2.82 MIL/uL — ABNORMAL LOW (ref 3.87–5.11)
Retic Count, Absolute: 134.8 10*3/uL (ref 19.0–186.0)
Retic Ct Pct: 4.8 % — ABNORMAL HIGH (ref 0.4–3.1)

## 2020-03-24 LAB — OCCULT BLOOD X 1 CARD TO LAB, STOOL: Fecal Occult Bld: NEGATIVE

## 2020-03-24 NOTE — Progress Notes (Signed)
Physical Therapy Weekly Progress Note  Patient Details  Name: [name] MRN: [id number] Date of Birth: Feb 14, 1946  Beginning of progress report period: Mar 17, 2020 End of progress report period: Mar 24, 2020  Today's Date: 03/24/2020 PT Individual Time: 4098-1191 AND 1556-1700 PT Individual Time Calculation (min): 68 min and 75 min   Patient has met 3 of 3 short term goals.  Pt has made significant progress towards LTG over the past. Receptive aphasia has improved greatly, allowing improved command following safety with use of various AD's. Over the past week, pt has progressed to supervision assist with RW for transfers and gait as well as CGA for stair management with BUE support.   Patient continues to demonstrate the following deficits muscle weakness, decreased cardiorespiratoy endurance, decreased attention, decreased awareness, decreased problem solving, decreased safety awareness, decreased memory and delayed processing and decreased standing balance, hemiplegia and decreased balance strategies and therefore will continue to benefit from skilled PT intervention to increase functional independence with mobility.  Patient progressing toward long term goals..  Continue plan of care.  PT Short Term Goals Week 2:  PT Short Term Goal 1 (Week 2): Pt will ambulate 79f with supervision assist and LRAD PT Short Term Goal 1 - Progress (Week 2): Met PT Short Term Goal 2 (Week 2): Pt will perform car transfer with min assist PT Short Term Goal 2 - Progress (Week 2): Met PT Short Term Goal 3 (Week 2): Pt will attend to task for up to 5 minutes with only moderate cues PT Short Term Goal 3 - Progress (Week 2): Met Week 3:  PT Short Term Goal 1 (Week 3): STG=LTG due to ELOS  Skilled Therapeutic Interventions/Progress Updates:  Session 1 Pt received supine in bed and agreeable to PT. Supine>sit transfer without assist or cues. [facility]. [facility] Gait training in simulated community environment of gift shop with RW and supervision assist x 1259f Min cues for AD management around obstacles on the R and safety in turns. Additional gait training through food court/atrium with RW and supervision assist x 10040fPT instructed pt in BLE NMR/therex: LAQ x 12 , seated calf raises x 20, sit<>stand x 10, mini squat x 10, standing hip abduction x 8 BLE, standing HS curl x 10 BLE, tandem stance with BUE support 4x 10 sec hold BLE. Patient returned to room and performed stand pivot to recliner with RW and superision assist. Pt then requesting to get into bed. Stand pivot with no AD and supervision assist. Sit>supine completed with out assist and left supine in bed with call bell in reach and all needs met.     Session 2.  Pt received supine in bed and agreeable to PT. Supine>sit transfer without assist or cues. [facility] Sustained attention tasks of rock painting in standing and  Multiple bouts of connect 4 in sitting. Pt able to tolerate 8 minutes standing at high low table prior to requesting rest break and then completed painting rock in sitting x 6 minutes. Pt able to sustain attention to connect 4 for up to 12 minutes with min cues for problem solving and anticipatory awareness. Dynamic gait training to perform figure 8 with and without AD x 2 each, forward, reverse gait with and without AD [id number], side stepping R and L with and then without AD [id number] x 2.  Supervision assist overall with and without AD, noted to have increased speed and step length with use of RW. nustep reciprocal movement and cardiorespiratory endurance x 8 minutes with encouragement to continue at about 6 minutes. Pt returned to room and performed stand pivot transfer to bed withno AD. Sit>supine completed without assist, and left supine in bed with call bell in reach and all needs met.        Therapy  Documentation Precautions:  Precautions Precautions: Fall Precaution Comments: cortrak Restrictions Weight Bearing Restrictions: No Pain: denies  Therapy/Group: Individual Therapy  Lorie Phenix 03/24/2020, 11:52 AM

## 2020-03-24 NOTE — Progress Notes (Signed)
Occupational Therapy Session Note  Patient Details  Name: [name] MRN: [id number] Date of Birth: 19-Aug-1946  Today's Date: 03/24/2020 OT Individual Time: [id number] OT Individual Time Calculation (min): 54 min    Short Term Goals: Week 2:  OT Short Term Goal 1 (Week 2): STGs=LTGs due to ELOS  Skilled Therapeutic Interventions/Progress Updates:    Upon entering the room, pt supine in bed with family present in the room. OT discussing shower set up with family and need to remove glass doors, place tension rod with shower curtain, and use TTB for safety with bathing. Family agreed and plan on purchasing TTB on their own. OT will follow up and practice transfer with pt and family members. Pt reports fatigue but is agreeable to OT intervention. Pt ambulates with RW and close supervision into bathroom. Pt seated on toilet with CGA for balance with clothing management and all clothing doffed while seated on commode. Pt unable to void during this session. Pt ambulating to sit on TTB with CGA for safety. Pt bathing while seated with lateral leans to wash buttocks and peri area. Pt needing min verbal guidance cuing for sequencing. Pt does report feeling cold but ambulated with close supervision and RW to sit on EOB for dressing tasks. Pt needing CGA for balance when threading feet into pants and standing to pull over B hips. Pt donning pull over shirt and applying deodorant with set up A. RN arrived with medication and pt seated on EOB to take meds. Pt returning to supine secondary to fatigue. Figure four position utilized for pt to [employer] with set up A. Pt remained supine to rest before next therapy session. Bed alarm activated and call bell within reach.   Therapy Documentation Precautions:  Precautions Precautions: Fall Precaution Comments: cortrak Restrictions Weight Bearing Restrictions: No General:   Vital Signs: Oxygen Therapy SpO2: 95 % O2 Device: Room Air Pain:    ADL: ADL Grooming: Minimal cueing Where Assessed-Grooming: Sitting at sink Upper Body Bathing: Moderate cueing Where Assessed-Upper Body Bathing: Sitting at sink Lower Body Bathing: Maximal assistance Where Assessed-Lower Body Bathing: Sitting at sink, Standing at sink Upper Body Dressing: Moderate assistance(for threadind d/t PICC and cortrak) Where Assessed-Upper Body Dressing: Sitting at sink Lower Body Dressing: Maximal assistance Where Assessed-Lower Body Dressing: Standing at sink, Sitting at sink Toileting: Maximal assistance Where Assessed-Toileting: Bedside Commode Toilet Transfer: Moderate assistance Toilet Transfer Method: Stand pivot Toilet Transfer Equipment: Bedside commode   Therapy/Group: Individual Therapy  [name] 03/24/2020, 9:59 AM

## 2020-03-24 NOTE — Progress Notes (Signed)
[place] PHYSICAL MEDICINE & REHABILITATION PROGRESS NOTE  Subjective/Complaints: Cortrak out Discussed need to increase fluid intake   ROS: Limited due to cognitive/behavioral    Objective: Vital Signs: Blood pressure (!) 119/58, pulse 90, temperature 98.8 F (37.1 C), resp. rate 18, weight 67.8 kg, SpO2 95 %. No results found. Recent Labs    03/23/20 0449 03/24/20 0414  WBC 4.9 6.1  HGB 9.1* 9.1*  HCT 30.4* 29.8*  PLT 433* 438*   No results for input(s): NA, K, CL, CO2, GLUCOSE, BUN, CREATININE, CALCIUM in the last 72 hours.  Physical Exam: BP (!) 119/58 (BP Location: Right Arm)   Pulse 90   Temp 98.8 F (37.1 C)   Resp 18   Wt 67.8 kg   SpO2 95%   BMI 27.34 kg/m      HEENT- + Cortrak General: No acute distress Mood and affect are appropriate Heart: Regular rate and rhythm no rubs murmurs or extra sounds Lungs: Clear to auscultation, breathing unlabored, no rales or wheezes Abdomen: Positive bowel sounds, soft nontender to palpation, nondistended Extremities: No clubbing, cyanosis, or edema Skin: No evidence of breakdown, no evidence of rash PICC Site CDI Musculoskeletal: Full range of motion in all 4 extremities. No joint swelling  5-/5 in BUE and BLE   Assessment/Plan: 1. Functional deficits secondary to R>L brain bleeds which require 3+ hours per day of interdisciplinary therapy in a comprehensive inpatient rehab setting.  Physiatrist is providing close team supervision and 24 hour management of active medical problems listed below.  Physiatrist and rehab team continue to assess barriers to discharge/monitor patient progress toward functional and medical goals  Care Tool:  Bathing    Body parts bathed by patient: Left arm, Chest, Abdomen, Front perineal area, Right upper leg, Left upper leg, Face, Right lower leg, Right arm, Left lower leg, Buttocks   Body parts bathed by helper: Buttocks     Bathing assist Assist Level: Contact  Guard/Touching assist     Upper Body Dressing/Undressing Upper body dressing   What is the patient wearing?: Hospital gown only    Upper body assist Assist Level: Set up assist    Lower Body Dressing/Undressing Lower body dressing      What is the patient wearing?: Incontinence brief, Pants     Lower body assist Assist for lower body dressing: Contact Guard/Touching assist     Toileting Toileting    Toileting assist Assist for toileting: Contact Guard/Touching assist     Transfers Chair/bed transfer  Transfers assist     Chair/bed transfer assist level: Contact Guard/Touching assist Chair/bed transfer assistive device: Programmer, multimedia   Ambulation assist      Assist level: Contact Guard/Touching assist Assistive device: Other (comment)(iv pole) Max distance: 81ft   Walk 10 feet activity   Assist     Assist level: Contact Guard/Touching assist Assistive device: Other (comment)   Walk 50 feet activity   Assist Walk 50 feet with 2 turns activity did not occur: Safety/medical concerns  Assist level: Contact Guard/Touching assist Assistive device: Rollator    Walk 150 feet activity   Assist Walk 150 feet activity did not occur: Safety/medical concerns  Assist level: Supervision/Verbal cueing Assistive device: Rollator    Walk 10 feet on uneven surface  activity   Assist Walk 10 feet on uneven surfaces activity did not occur: Safety/medical concerns   Assist level: Supervision/Verbal cueing     Wheelchair     Assist Will patient use wheelchair at  discharge?: No(No PT LTG set)   Wheelchair activity did not occur: Safety/medical concerns         Wheelchair 50 feet with 2 turns activity    Assist    Wheelchair 50 feet with 2 turns activity did not occur: Safety/medical concerns       Wheelchair 150 feet activity     Assist Wheelchair 150 feet activity did not occur: Safety/medical concerns           Medical Problem List and Plan: 1.  Deficits with mobility, self-care, cognition secondary to R>L brain bleeds.  Mainly cognitive dysfunction more so than weakness.  Also severe dysphagia  Continue CIR PT, OT, SLP 2.  PE/Antithrombotics: IVC to remain in place repeat scan in 3 months-anticoagulation:  Pharmaceutical: Other (comment)per heme will switch to Xarelto today              -antiplatelet therapy: N/A 3. Heaches/Pain Management: On depakote tid for agitation as well as HA.     No c/os today              Monitor with increased emulation 4. Mood: LCSW to follow for evaluation and support.              -antipsychotic agents: N/A 5. Neuropsych: This patient is not capable of making decisions on her own behalf.  Telesitter for safety still required 6. Skin/Wound Care: Routine pressure relief measures. PICC site CDI  7. Fluids/Electrolytes/Nutrition: Monitor I/Os.             Advance diet as tolerated 8. 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 Decadron tapered to 4 mg bid on 04/27--slow taper to off.   9. Polycythemia Vera: JAK2 mutation positive. S/p Leukapheresis X 3. Hydrea prn to keep HCT<45  HCT 31 on 5/10, 30.4 on 5/13 10. HIT:  SRA negative but H[facility] screen [facility] strongly positive.   Thrombocytopenia resolved now with mild thrombo cytosis , Heme following 11. PAF/A fib with RVR:  Felt to be due to illness . Current recommendations are for [facility] for 6-9 months.   HR controlled             Monitor with increased activity 12.  Steroid-induced hyperglycemia resolved off steroids  CBG (last 3)  Recent Labs    03/22/20 2127 03/23/20 0628 03/23/20 1241  GLUCAP 157* 105* 108*  off TF may d/c CBG  13. Delirium:  With sundowning.Improving  On amantadine for activation and Depakote for agitation. Low dose Seroquel prn, none taken for >3d  No agitation 14. Anemia: Continue to  monitor with daily labs.   Hemoglobin 8.6 5/4- stool guaic pending  Continue to monitor  >9.3 on 5/9  PPI 15.  Post stroke dysphagia  Dysphagia 3, thin liquids. Intake poor to borderline although may be improving over last 24  DC TF - enc fluids, Check BMET 5/17 if normal we can d/c PICC  5/10 BMET reviewed and WNL  Advance diet as tolerated 16.  Essential hypertension  Continue hydralazine 100 3 times daily  Continue losartan 50 twice daily   Vitals:   03/24/20 0535 03/24/20 0732  BP: (!) 119/58   Pulse: 90   Resp: 18   Temp: 98.8 F (37.1 C)   SpO2: 97% 95%  BP controlled     18.  Severe hypoalbuminemia  Supplement initiated- expect slow improvement      LOS: 14 days A FACE  TO FACE EVALUATION WAS PERFORMED  Charlett Blake 03/24/2020, 7:53 AM

## 2020-03-25 ENCOUNTER — Inpatient Hospital Stay (HOSPITAL_COMMUNITY): Payer: Medicare HMO | Admitting: Physical Therapy

## 2020-03-25 MED ORDER — HYDRALAZINE HCL 50 MG PO TABS
75.0000 mg | ORAL_TABLET | Freq: Three times a day (TID) | ORAL | Status: DC
  Administered 2020-03-25 – 2020-03-26 (×3): 75 mg via ORAL
  Filled 2020-03-25 (×3): qty 1

## 2020-03-25 MED ORDER — HYDROXYUREA 500 MG PO CAPS
500.0000 mg | ORAL_CAPSULE | Freq: Every day | ORAL | Status: DC
  Administered 2020-03-26 – 2020-03-29 (×4): 500 mg via ORAL
  Filled 2020-03-25 (×4): qty 1

## 2020-03-25 MED ORDER — SENNOSIDES-DOCUSATE SODIUM 8.6-50 MG PO TABS
2.0000 | ORAL_TABLET | Freq: Two times a day (BID) | ORAL | Status: DC
  Administered 2020-03-25 – 2020-03-29 (×8): 2 via ORAL
  Filled 2020-03-25 (×8): qty 2

## 2020-03-25 NOTE — Progress Notes (Signed)
Pt has a DL picc line in her left upper arm, placed 02-27-20; pt is no longer receiving iv medications; suggest removing the picc if it is no longer necessary;  Please advise;  Thank you!

## 2020-03-25 NOTE — Progress Notes (Signed)
[place] PHYSICAL MEDICINE & REHABILITATION PROGRESS NOTE  Subjective/Complaints: Cortrak out Discussed need to increase fluid intake   ROS: Limited due to cognitive/behavioral    Objective: Vital Signs: Blood pressure (!) 93/49, pulse 93, temperature 98 F (36.7 C), temperature source Oral, resp. rate 16, weight 67.6 kg, SpO2 99 %. No results found. Recent Labs    03/23/20 0449 03/24/20 0414  WBC 4.9 6.1  HGB 9.1* 9.1*  HCT 30.4* 29.8*  PLT 433* 438*   No results for input(s): NA, K, CL, CO2, GLUCOSE, BUN, CREATININE, CALCIUM in the last 72 hours.  Physical Exam: BP (!) 93/49 (BP Location: Right Arm)   Pulse 93   Temp 98 F (36.7 C) (Oral)   Resp 16   Wt 67.6 kg   SpO2 99%   BMI 27.26 kg/m      HEENT- + Cortrak General: No acute distress Mood and affect are appropriate Heart: Regular rate and rhythm no rubs murmurs or extra sounds Lungs: Clear to auscultation, breathing unlabored, no rales or wheezes Abdomen: Positive bowel sounds, soft nontender to palpation, nondistended Extremities: No clubbing, cyanosis, or edema Skin: No evidence of breakdown, no evidence of rash PICC Site CDI Musculoskeletal: Full range of motion in all 4 extremities. No joint swelling  5-/5 in BUE and BLE   Assessment/Plan: 1. Functional deficits secondary to R>L brain bleeds which require 3+ hours per day of interdisciplinary therapy in a comprehensive inpatient rehab setting.  Physiatrist is providing close team supervision and 24 hour management of active medical problems listed below.  Physiatrist and rehab team continue to assess barriers to discharge/monitor patient progress toward functional and medical goals  Care Tool:  Bathing    Body parts bathed by patient: Left arm, Chest, Abdomen, Front perineal area, Right upper leg, Left upper leg, Face, Right lower leg, Right arm, Left lower leg, Buttocks   Body parts bathed by helper: Buttocks     Bathing assist Assist  Level: Contact Guard/Touching assist     Upper Body Dressing/Undressing Upper body dressing   What is the patient wearing?: Pull over shirt    Upper body assist Assist Level: Set up assist    Lower Body Dressing/Undressing Lower body dressing      What is the patient wearing?: Incontinence brief, Pants     Lower body assist Assist for lower body dressing: Contact Guard/Touching assist     Toileting Toileting    Toileting assist Assist for toileting: Contact Guard/Touching assist     Transfers Chair/bed transfer  Transfers assist     Chair/bed transfer assist level: Supervision/Verbal cueing Chair/bed transfer assistive device: Programmer, multimedia   Ambulation assist      Assist level: Supervision/Verbal cueing Assistive device: Walker-rolling Max distance: 120   Walk 10 feet activity   Assist     Assist level: Supervision/Verbal cueing Assistive device: Walker-rolling   Walk 50 feet activity   Assist Walk 50 feet with 2 turns activity did not occur: Safety/medical concerns  Assist level: Supervision/Verbal cueing Assistive device: Walker-rolling    Walk 150 feet activity   Assist Walk 150 feet activity did not occur: Safety/medical concerns  Assist level: Supervision/Verbal cueing Assistive device: Walker-rolling    Walk 10 feet on uneven surface  activity   Assist Walk 10 feet on uneven surfaces activity did not occur: Safety/medical concerns   Assist level: Supervision/Verbal cueing     Wheelchair     Assist Will patient use wheelchair at discharge?: No(No PT  LTG set)   Wheelchair activity did not occur: Safety/medical concerns         Wheelchair 50 feet with 2 turns activity    Assist    Wheelchair 50 feet with 2 turns activity did not occur: Safety/medical concerns       Wheelchair 150 feet activity     Assist Wheelchair 150 feet activity did not occur: Safety/medical concerns           Medical Problem List and Plan: 1.  Deficits with mobility, self-care, cognition secondary to R>L brain bleeds.  Mainly cognitive dysfunction more so than weakness.  Also severe dysphagia  Continue CIR PT, OT, SLP 2.  PE/Antithrombotics: IVC to remain in place repeat scan in 3 months-anticoagulation:  Pharmaceutical: Other (comment)per heme will switch to Xarelto today              -antiplatelet therapy: N/A 3. Heaches/Pain Management: On depakote tid for agitation as well as HA.     None today             Monitor with increased emulation 4. Mood: LCSW to follow for evaluation and support.              -antipsychotic agents: N/A 5. Neuropsych: This patient is not capable of making decisions on her own behalf.  Telesitter for safety still required 6. Skin/Wound Care: Routine pressure relief measures. PICC site CDI  7. Fluids/Electrolytes/Nutrition: Monitor I/Os.             Advance diet as tolerated 8. 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 Decadron tapered to 4 mg bid on 04/27--slow taper to off.   9. Polycythemia Vera: JAK2 mutation positive. S/p Leukapheresis X 3. Hydrea prn to keep HCT<45  HCT 31 on 5/10, 30.4 on 5/13 10. HIT:  SRA negative but HIT screen at [facility] strongly positive.   Thrombocytopenia resolved now with mild thrombo cytosis , Heme following 11. PAF/A fib with RVR:  Felt to be due to illness . Current recommendations are for [facility] for 6-9 months.   HR controlled at 93             Monitor with increased activity 12.  Steroid-induced hyperglycemia resolved off steroids  CBG (last 3)  Recent Labs    03/22/20 2127 03/23/20 0628 03/23/20 1241  GLUCAP 157* 105* 108*  off TF may d/c CBG  13. Delirium:  With sundowning.Improving  On amantadine for activation and Depakote for agitation. Low dose Seroquel prn, none taken for >3d  No agitation 14. Anemia: Continue to  monitor with daily labs.   Hemoglobin 8.6 5/4- stool guaic pending  Continue to monitor  >9.3 on 5/9  PPI 15.  Post stroke dysphagia  Dysphagia 3, thin liquids. Intake poor to borderline although may be improving over last 24  DC TF - enc fluids, Check BMET 5/17 if normal we can d/c PICC  5/10 BMET reviewed and WNL  Advance diet as tolerated 16.  Essential hypertension  Continue hydralazine 100 3 times daily  Continue losartan 50 twice daily   Vitals:   03/25/20 1304 03/25/20 1306  BP: (!) 93/49 (!) 93/49  Pulse:  93  Resp: 16 16  Temp:  98 F (36.7 C)  SpO2:  99%  BP low- decrease hydralazine to 75 TID    18.  Severe hypoalbuminemia  Supplement initiated- expect slow improvement      LOS: 15 days  A FACE TO FACE EVALUATION WAS PERFORMED  [name] 03/25/2020, 3:17 PM

## 2020-03-25 NOTE — Progress Notes (Signed)
Physical Therapy Session Note  Patient Details  Name: [name] MRN: [id number] Date of Birth: [birth year]  Today's Date: 03/25/2020 PT Individual Time: 1105-1205 PT Individual Time Calculation (min): 60 min   Short Term Goals: Week 3:  PT Short Term Goal 1 (Week 3): STG=LTG due to ELOS  Skilled Therapeutic Interventions/Progress Updates:   Pt received supine in bed and agreeable to PT. Supine>sit transfer without assist or cues. RN notified PT that pt is due for timed toileting, but pt declines at start of session.    Dynamic balance training with corn holl toss x 4 bouts without UE support. Pt then picked up 7 bean bags from board and floor following each bout. Supervision assist overall to toss without UE support and to pick up bags with 1 UE supported on RW, one near posterior LOB, requiring min assist to prevent fall. Pt also instructed in fine motor problem solving task to complete low difficulty pipe tree puzzles, while standing on red wedge. Supervision assist to maintain balance with 1 UE support at all times and mod cues for processing and problem solving.   Gait training with RW x 129f with supervision assist and min cues for safety in turns. No LOB noted throughout.   Pt agreeable to attempt toileting at end of session with supervision assist to transfer to toilet. Able to void bladder; clothing management and pericare with supervision assist for safety to prevent posterior LOB. Patient returned to room and performed stand pivot to recliner without AD and supervision A for safety. Pt left sitting in recliner with call bell in reach and all needs met.        Therapy Documentation Precautions:  Precautions Precautions: Fall Precaution Comments: cortrak Restrictions Weight Bearing Restrictions: No    Vital Signs: Therapy Vitals BP: 113/62 Oxygen Therapy SpO2: 97 % O2 Device: Room Air Pain: denies  Therapy/Group: Individual Therapy  ALorie Phenix5/15/2021, 12:09 PM

## 2020-03-26 ENCOUNTER — Inpatient Hospital Stay (HOSPITAL_COMMUNITY): Payer: Medicare HMO | Admitting: Physical Therapy

## 2020-03-26 MED ORDER — UMECLIDINIUM BROMIDE 62.5 MCG/INH IN AEPB
1.0000 | INHALATION_SPRAY | Freq: Every day | RESPIRATORY_TRACT | Status: DC
  Administered 2020-03-28 – 2020-03-29 (×2): 1 via RESPIRATORY_TRACT
  Filled 2020-03-26: qty 7

## 2020-03-26 MED ORDER — HYDRALAZINE HCL 50 MG PO TABS
50.0000 mg | ORAL_TABLET | Freq: Three times a day (TID) | ORAL | Status: DC
  Administered 2020-03-26 – 2020-03-29 (×8): 50 mg via ORAL
  Filled 2020-03-26 (×8): qty 1

## 2020-03-26 NOTE — Progress Notes (Signed)
[place] PHYSICAL MEDICINE & REHABILITATION PROGRESS NOTE  Subjective/Complaints: No complaints. May DC PICC D/ced IV Zofran Appreciate pharm recs; added incruse to complete components of patient's home Trelegy  ROS: Limited due to cognitive/behavioral    Objective: Vital Signs: Blood pressure 111/66, pulse 99, temperature 97.7 F (36.5 C), resp. rate 19, weight 68.5 kg, SpO2 100 %. No results found. Recent Labs    03/24/20 0414  WBC 6.1  HGB 9.1*  HCT 29.8*  PLT 438*   No results for input(s): NA, K, CL, CO2, GLUCOSE, BUN, CREATININE, CALCIUM in the last 72 hours.  Physical Exam: BP 111/66 (BP Location: Right Arm)   Pulse 99   Temp 97.7 F (36.5 C)   Resp 19   Wt 68.5 kg   SpO2 100%   BMI 27.62 kg/m  HEENT- Cortrak removed General: No acute distress Mood and affect are appropriate Heart: Regular rate and rhythm no rubs murmurs or extra sounds Lungs: Clear to auscultation, breathing unlabored, no rales or wheezes Abdomen: Positive bowel sounds, soft nontender to palpation, nondistended Extremities: No clubbing, cyanosis, or edema Skin: No evidence of breakdown, no evidence of rash PICC Site CDI Musculoskeletal: Full range of motion in all 4 extremities. No joint swelling  5-/5 in BUE and BLE   Assessment/Plan: 1. Functional deficits secondary to R>L brain bleeds which require 3+ hours per day of interdisciplinary therapy in a comprehensive inpatient rehab setting.  Physiatrist is providing close team supervision and 24 hour management of active medical problems listed below.  Physiatrist and rehab team continue to assess barriers to discharge/monitor patient progress toward functional and medical goals  Care Tool:  Bathing    Body parts bathed by patient: Left arm, Chest, Abdomen, Front perineal area, Right upper leg, Left upper leg, Face, Right lower leg, Right arm, Left lower leg, Buttocks   Body parts bathed by helper: Buttocks     Bathing assist  Assist Level: Contact Guard/Touching assist     Upper Body Dressing/Undressing Upper body dressing   What is the patient wearing?: Pull over shirt    Upper body assist Assist Level: Set up assist    Lower Body Dressing/Undressing Lower body dressing      What is the patient wearing?: Incontinence brief, Pants     Lower body assist Assist for lower body dressing: Contact Guard/Touching assist     Toileting Toileting    Toileting assist Assist for toileting: Contact Guard/Touching assist     Transfers Chair/bed transfer  Transfers assist     Chair/bed transfer assist level: Supervision/Verbal cueing Chair/bed transfer assistive device: Programmer, multimedia   Ambulation assist      Assist level: Contact Guard/Touching assist Assistive device: Walker-rolling Max distance: 178ft   Walk 10 feet activity   Assist     Assist level: Supervision/Verbal cueing Assistive device: Walker-rolling   Walk 50 feet activity   Assist Walk 50 feet with 2 turns activity did not occur: Safety/medical concerns  Assist level: Supervision/Verbal cueing Assistive device: Walker-rolling    Walk 150 feet activity   Assist Walk 150 feet activity did not occur: Safety/medical concerns  Assist level: Contact Guard/Touching assist Assistive device: Walker-rolling    Walk 10 feet on uneven surface  activity   Assist Walk 10 feet on uneven surfaces activity did not occur: Safety/medical concerns   Assist level: Supervision/Verbal cueing     Wheelchair     Assist Will patient use wheelchair at discharge?: No(No PT LTG set)  Wheelchair activity did not occur: Safety/medical concerns         Wheelchair 50 feet with 2 turns activity    Assist    Wheelchair 50 feet with 2 turns activity did not occur: Safety/medical concerns       Wheelchair 150 feet activity     Assist Wheelchair 150 feet activity did not occur: Safety/medical  concerns          Medical Problem List and Plan: 1.  Deficits with mobility, self-care, cognition secondary to R>L brain bleeds.  Mainly cognitive dysfunction more so than weakness.  Also severe dysphagia  Continue CIR PT, OT, SLP 2.  PE/Antithrombotics: IVC to remain in place repeat scan in 3 months-anticoagulation:  Pharmaceutical: Other (comment)per heme will switch to Xarelto today              -antiplatelet therapy: N/A 3. Heaches/Pain Management: On depakote tid for agitation as well as HA.     Resolved             Monitor with increased emulation 4. Mood: LCSW to follow for evaluation and support.              -antipsychotic agents: N/A 5. Neuropsych: This patient is not capable of making decisions on her own behalf.  Telesitter for safety still required 6. Skin/Wound Care: Routine pressure relief measures. PICC site CDI  7. Fluids/Electrolytes/Nutrition: Monitor I/Os.             Advance diet as tolerated 8. 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 Decadron tapered to 4 mg bid on 04/27--slow taper to off.   9. Polycythemia Vera: JAK2 mutation positive. S/p Leukapheresis X 3. Hydrea prn to keep HCT<45  HCT 31 on 5/10, 30.4 on 5/13 10. HIT:  SRA negative but HIT screen at [facility] strongly positive.   Thrombocytopenia resolved now with mild thrombo cytosis , Heme following 11. PAF/A fib with RVR:  Felt to be due to illness . Current recommendations are for [facility] [place] for 6-9 months.   HR controlled at 93             Monitor with increased activity 12.  Steroid-induced hyperglycemia resolved off steroids  CBG (last 3)  No results for input(s): GLUCAP in the last 72 hours.off TF may d/c CBG  13. Delirium:  With sundowning.Improving  On amantadine for activation and Depakote for agitation. Low dose Seroquel prn, none taken for >3d  No agitation 14. Anemia: Continue to monitor with daily  labs.   Hemoglobin 8.6 5/4- stool guaic pending  Continue to monitor  >9.3 on 5/9  PPI 15.  Post stroke dysphagia  Dysphagia 3, thin liquids. Intake poor to borderline although may be improving over last 24  DC TF - enc fluids, Check BMET 5/17 if normal we can d/c PICC  5/10 BMET reviewed and WNL  Advance diet as tolerated 16.  Essential hypertension  Continue hydralazine 100 3 times daily  Continue losartan 50 twice daily   Vitals:   03/26/20 0928 03/26/20 1443  BP: 109/60 111/66  Pulse:  99  Resp:  19  Temp:  97.7 F (36.5 C)  SpO2:  100%  BP low- decrease hydralazine to 50mg  TID 18.  Severe hypoalbuminemia  Supplement initiated- expect slow improvement      LOS: 16 days A FACE TO FACE EVALUATION WAS PERFORMED  [name] 03/26/2020, 4:39 PM

## 2020-03-26 NOTE — Progress Notes (Signed)
Physical Therapy Session Note  Patient Details  Name: [name] MRN: [id number] Date of Birth: [birth year]  Today's Date: 03/26/2020 PT Individual Time: 1122-1216 PT Individual Time Calculation (min): 54 min   Short Term Goals: Week 3:  PT Short Term Goal 1 (Week 3): STG=LTG due to ELOS  Skilled Therapeutic Interventions/Progress Updates:   Pt received supine in bed and agreeable to therapy session. Supine>sitting R EOB with supervision. Donned shoes sitting EOB with supervision for safety. Gait ~55ft to bathroom using RW with close supervision for safety. Sit<>stand on/off toilet using RW/grab bar with with supervision. Continent of bladder and bowels - noticed slight incontinence of bladder in brief - seated peri-care with close supervision for safety as pt leans R onto moveable grab bar. Gait ~73ft to sink to wash hands with supervision. Seated rest break. Gait training ~117ft to main therapy gym (1x seated rest break at elevators) using RW with close supervision - cuing for AD management when turning to sit. Pt requires frequent seated rest breaks due to SOB and fatigue - SpO2 monitored and >98% with HR elevating up to 116bpm during activity - allowed to lower prior to next activity. Gait in gym distances <64ft, no AD, with CGA for steadying. Lateral side stepping in // bars, no UE support, with CGA for safety but no unsteadiness noted - cuing for increased step length. Dynamic standing balance with dual-cognitive task of 3-cone taping on verbal command - pt having difficulty understanding instructions of task requiring repeated cuing to tap correct cone (especially L). Dynamic standing balance and L attention task to grasp clothespins from high place on basketball goal then squat down to place on low surface with color coordination. Sidelying rest break due to fatigue. Gait training ~12ft back to room using RW with CGA for steadying due to pt reporting B LE fatigue. Let seated in recliner  with needs in reach, seat belt alarm on, LEs elevated, and meal tray set-up.  Therapy Documentation Precautions:  Precautions Precautions: Fall Precaution Comments: cortrak Restrictions Weight Bearing Restrictions: No  Pain: Denies pain during session.   Therapy/Group: Individual Therapy  Tawana Scale, PT, DPT 03/26/2020, 10:38 AM

## 2020-03-27 ENCOUNTER — Inpatient Hospital Stay (HOSPITAL_COMMUNITY): Payer: Medicare HMO | Admitting: Physical Therapy

## 2020-03-27 ENCOUNTER — Inpatient Hospital Stay (HOSPITAL_COMMUNITY): Payer: Medicare HMO | Admitting: Occupational Therapy

## 2020-03-27 ENCOUNTER — Inpatient Hospital Stay (HOSPITAL_COMMUNITY): Payer: Medicare HMO

## 2020-03-27 DIAGNOSIS — I8290 Acute embolism and thrombosis of unspecified vein: Secondary | ICD-10-CM

## 2020-03-27 DIAGNOSIS — Z95828 Presence of other vascular implants and grafts: Secondary | ICD-10-CM

## 2020-03-27 DIAGNOSIS — R1311 Dysphagia, oral phase: Secondary | ICD-10-CM

## 2020-03-27 DIAGNOSIS — I10 Essential (primary) hypertension: Secondary | ICD-10-CM

## 2020-03-27 LAB — CBC
HCT: 29.7 % — ABNORMAL LOW (ref 36.0–46.0)
Hemoglobin: 9.3 g/dL — ABNORMAL LOW (ref 12.0–15.0)
MCH: 32.4 pg (ref 26.0–34.0)
MCHC: 31.3 g/dL (ref 30.0–36.0)
MCV: 103.5 fL — ABNORMAL HIGH (ref 80.0–100.0)
Platelets: 438 10*3/uL — ABNORMAL HIGH (ref 150–400)
RBC: 2.87 MIL/uL — ABNORMAL LOW (ref 3.87–5.11)
RDW: 20.4 % — ABNORMAL HIGH (ref 11.5–15.5)
WBC: 4.6 10*3/uL (ref 4.0–10.5)
nRBC: 0 % (ref 0.0–0.2)

## 2020-03-27 LAB — BASIC METABOLIC PANEL
Anion gap: 9 (ref 5–15)
BUN: 15 mg/dL (ref 8–23)
CO2: 21 mmol/L — ABNORMAL LOW (ref 22–32)
Calcium: 8.9 mg/dL (ref 8.9–10.3)
Chloride: 106 mmol/L (ref 98–111)
Creatinine, Ser: 0.78 mg/dL (ref 0.44–1.00)
GFR calc Af Amer: >60 mL/min (ref >60)
GFR calc non Af Amer: >60 mL/min (ref >60)
Glucose, Bld: 115 mg/dL — ABNORMAL HIGH (ref 70–99)
Potassium: 4 mmol/L (ref 3.5–5.1)
Sodium: 136 mmol/L (ref 135–145)

## 2020-03-27 LAB — TRIGLYCERIDES: Triglycerides: 81 mg/dL (ref <150)

## 2020-03-27 LAB — OCCULT BLOOD X 1 CARD TO LAB, STOOL: Fecal Occult Bld: NEGATIVE

## 2020-03-27 MED ORDER — LIVING WELL WITH DIABETES BOOK
Freq: Once | Status: DC
  Filled 2020-03-27: qty 1

## 2020-03-27 NOTE — Progress Notes (Signed)
Physical Therapy Session Note  Patient Details  Name: [name] MRN: [id number] Date of Birth: 06/19/1946  Today's Date: 03/27/2020 PT Individual Time: 1305-1415 PT Individual Time Calculation (min): 70 min   Short Term Goals: Week 2:  PT Short Term Goal 1 (Week 2): Pt will ambulate 50ft with supervision assist and LRAD PT Short Term Goal 1 - Progress (Week 2): Met PT Short Term Goal 2 (Week 2): Pt will perform car transfer with min assist PT Short Term Goal 2 - Progress (Week 2): Met PT Short Term Goal 3 (Week 2): Pt will attend to task for up to 5 minutes with only moderate cues PT Short Term Goal 3 - Progress (Week 2): Met  Week 3:  PT Short Term Goal 1 (Week 3): STG=LTG due to ELOS  Skilled Therapeutic Interventions/Progress Updates: Pt presented in bed agreeable to therapy. Pt denies pain during session and states stomach feels better then am. Performed bed mobility supervision with use of bed features and donned shoes with set up. Pt requesting to use bathroom, ambulated to bathroom with RW and supervision with supervision toilet transfers (+void). Pt then returned to EOB for seated break due to fatigue. Pt then ambulated to rehab gym with RW and supervision. Performed toe taps to 4in block 2 x 10 for wt shifting. Ambulated to parallel bars HHA and performed side stepping and backwards walking in parallel bars x2 each direction. Pt also performed forward steps and side step ups to 4in block in parallel bars with minimal UE support for BLE strengthening. Pt then expressed need for possible BM. Pt transported back to room for time management and performed ambulatory transfer to toilet with RW and supervision (+BM). Pt then returned to w/c and transported to day room. Participated in ambulation weaving through cones with supervision and demonstrating overall fair safety with negotiation of RW around obstacles. Pt then ambulated to NuStep and participated in L3 x 5 min for general  conditioning and cardiovascular endurance. Pt transferred back to w/c and transported back to room. In room performed ambulatory transfer back to bed with RW and supervision. Pt left in bed at end of session with bed alarm on, call bell within reach and needs met.      Therapy Documentation Precautions:  Precautions Precautions: Fall Precaution Comments: cortrak Restrictions Weight Bearing Restrictions: No General:   Vital Signs: Therapy Vitals Pulse Rate: 90 BP: (!) 100/54 Pain: Pain Assessment Pain Score: 0-No pain   Therapy/Group: Individual Therapy      , PTA  03/27/2020, 3:47 PM  

## 2020-03-27 NOTE — Plan of Care (Signed)
  Problem: Consults Goal: RH GENERAL PATIENT EDUCATION Description: See Patient Education module for education specifics. Educate patient on safety Outcome: Progressing Goal: Skin Care Protocol Initiated - if Braden Score 18 or less Description:  N/A Outcome: Progressing   Problem: RH BLADDER ELIMINATION Goal: RH STG MANAGE BLADDER WITH ASSISTANCE Description: STG Manage Bladder With Mod I Assistance Outcome: Progressing   Problem: RH SKIN INTEGRITY Goal: RH STG SKIN FREE OF INFECTION/BREAKDOWN Description: Skin to remain free of breakdown or infection during rehab stay with min assist Outcome: Progressing Goal: RH STG MAINTAIN SKIN INTEGRITY WITH ASSISTANCE Description: STG Maintain Skin Integrity With [place]. Outcome: Progressing   Problem: RH SAFETY Goal: RH STG ADHERE TO SAFETY PRECAUTIONS W/ASSISTANCE/DEVICE Description: STG Adhere to Safety Precautions With Mod I Assistance/Device walker. Outcome: Progressing   Problem: RH PAIN MANAGEMENT Goal: RH STG PAIN MANAGED AT OR BELOW PT'S PAIN GOAL Description: Pain goal of 3 on scale of 0-10, using facial recognition  Outcome: Progressing

## 2020-03-27 NOTE — Progress Notes (Signed)
[place] PHYSICAL MEDICINE & REHABILITATION PROGRESS NOTE  Subjective/Complaints: Patient seen sitting up in bed this AM.  She states she slept well overnight. She states she had a good weekend.  She states she is looking forward to being discharged soon.   ROS: Limited due to cognitive/behavioral, but appears to deny CP, shortness of breath, nausea, vomiting and diarrhea.   Objective: Vital Signs: Blood pressure (!) 118/59, pulse 92, temperature 98.2 F (36.8 C), resp. rate 18, weight 68.5 kg, SpO2 98 %. No results found. Recent Labs    03/27/20 0517  WBC 4.6  HGB 9.3*  HCT 29.7*  PLT 438*   Recent Labs    03/27/20 0517  NA 136  K 4.0  CL 106  CO2 21*  GLUCOSE 115*  BUN 15  CREATININE 0.78  CALCIUM 8.9    Physical Exam: BP (!) 118/59 (BP Location: Right Arm)   Pulse 92   Temp 98.2 F (36.8 C)   Resp 18   Wt 68.5 kg   SpO2 98%   BMI 27.62 kg/m  Constitutional: No distress . Vital signs reviewed. HENT: Normocephalic.  Atraumatic. Eyes: EOMI. No discharge. Cardiovascular: No JVD. Respiratory: Normal effort.  No stridor. GI: Non-distended. Skin: Warm and dry.  Intact. Psych: Normal mood.  Normal behavior. Musc: No edema in extremities.  No tenderness in extremities. Neuro: Alert Motor: Grossly 5/5 throughout  Assessment/Plan: 1. Functional deficits secondary to R>L brain bleeds which require 3+ hours per day of interdisciplinary therapy in a comprehensive inpatient rehab setting.  Physiatrist is providing close team supervision and 24 hour management of active medical problems listed below.  Physiatrist and rehab team continue to assess barriers to discharge/monitor patient progress toward functional and medical goals  Care Tool:  Bathing    Body parts bathed by patient: Left arm, Chest, Abdomen, Front perineal area, Right upper leg, Left upper leg, Face, Right lower leg, Right arm, Left lower leg, Buttocks   Body parts bathed by helper: Buttocks      Bathing assist Assist Level: Contact Guard/Touching assist     Upper Body Dressing/Undressing Upper body dressing   What is the patient wearing?: Pull over shirt    Upper body assist Assist Level: Set up assist    Lower Body Dressing/Undressing Lower body dressing      What is the patient wearing?: Incontinence brief, Pants     Lower body assist Assist for lower body dressing: Contact Guard/Touching assist     Toileting Toileting    Toileting assist Assist for toileting: Contact Guard/Touching assist     Transfers Chair/bed transfer  Transfers assist     Chair/bed transfer assist level: Supervision/Verbal cueing Chair/bed transfer assistive device: Programmer, multimedia   Ambulation assist      Assist level: Contact Guard/Touching assist Assistive device: Walker-rolling Max distance: 145ft   Walk 10 feet activity   Assist     Assist level: Supervision/Verbal cueing Assistive device: Walker-rolling   Walk 50 feet activity   Assist Walk 50 feet with 2 turns activity did not occur: Safety/medical concerns  Assist level: Supervision/Verbal cueing Assistive device: Walker-rolling    Walk 150 feet activity   Assist Walk 150 feet activity did not occur: Safety/medical concerns  Assist level: Contact Guard/Touching assist Assistive device: Walker-rolling    Walk 10 feet on uneven surface  activity   Assist Walk 10 feet on uneven surfaces activity did not occur: Safety/medical concerns   Assist level: Supervision/Verbal cueing  Wheelchair     Assist Will patient use wheelchair at discharge?: No(No PT LTG set)   Wheelchair activity did not occur: Safety/medical concerns         Wheelchair 50 feet with 2 turns activity    Assist    Wheelchair 50 feet with 2 turns activity did not occur: Safety/medical concerns       Wheelchair 150 feet activity     Assist Wheelchair 150 feet activity did not occur:  Safety/medical concerns          Medical Problem List and Plan: 1.  Deficits with mobility, self-care, cognition secondary to R>L brain bleeds.  Mainly cognitive dysfunction more so than weakness.  Continue CIR 2.  PE/Antithrombotics: IVC to remain in place repeat scan in 3 months-anticoagulation:  Pharmaceutical: Other (comment)per heme, switched to Xarelto              -antiplatelet therapy: N/A 3. Heaches/Pain Management: On depakote tid for agitation as well as HA.     Resolved             Monitor with increased emulation 4. Mood: LCSW to follow for evaluation and support.              -antipsychotic agents: N/A 5. Neuropsych: This patient is not capable of making decisions on her own behalf.  Continue telesitter for safety  6. Skin/Wound Care: Routine pressure relief measures. PICC site CDI  7. Fluids/Electrolytes/Nutrition: Monitor I/Os.             Advance diet as tolerated 8. 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 Decadron tapered to 4 mg bid on 04/27--slow taper to off.   9. Polycythemia Vera: JAK2 mutation positive. S/p Leukapheresis X 3. Hydrea prn to keep HCT<45   Hematocrit 29.7 on 5/17 10. HIT:  SRA negative but HIT screen at [facility] strongly positive.   Thrombocytopenia resolved now with mild thrombo cytosis , Heme following  11. PAF/A fib with RVR:  Felt to be due to illness . Current recommendations are for [facility] for 6-9 months.   HR controlled on 5/17             Monitor with increased activity 12.  Steroid-induced hyperglycemia resolved off steroids  13. Delirium:  With sundowning. On amantadine for activation and Depakote for agitation. Low dose Seroquel prn  Improved 14. Anemia: Continue to monitor with daily labs.   Hemoglobin 9.3 on 5/17  Hemoccult negative  PPI 15.  Post stroke dysphagia  Dysphagia 3, thin liquids.  P.o. intake improving  Advance diet as  tolerated 16.  Essential hypertension  Continue losartan 50 twice daily   Vitals:   03/27/20 0518 03/27/20 0856  BP: 113/67 (!) 118/59  Pulse: 84 92  Resp: 18   Temp: 98.2 F (36.8 C)   SpO2: 98%    Decrease hydralazine to 50mg  TID  Controlled on 5/17 18.  Severe hypoalbuminemia  Supplement initiated- expect slow improvement      LOS: 17 days A FACE TO FACE EVALUATION WAS PERFORMED  [name] [name] 03/27/2020, 12:48 PM

## 2020-03-27 NOTE — Progress Notes (Signed)
Speech Language Pathology Daily Session Note  Patient Details  Name: VAUNDA RAPOPORT MRN: [id number] Date of Birth: 05-13-1946  Today's Date: 03/27/2020 SLP Individual Time: 1002-1044 SLP Individual Time Calculation (min): 42 min  Short Term Goals: Week 3: SLP Short Term Goal 1 (Week 3): STG=LTG due to short ELOS  Skilled Therapeutic Interventions: Skilled ST services focused on cognitive skills. Pt was orientated to place/ situtaion and disoriented to time. SLP provided a calendar to aid in orientation in time. SLP facilitated reading comprehension at word level, pt demonstrated ability to select word from a field of 3 to match picture In 6 out 10 opportunities with max A verbal cues and named picture in 8 out 10 opportunities with min A semantic cues. SLP also facilitated basic problem solving and auditory comprehension in money sorting task, pt required mod A verbal cues following initial demonstration and ability to name coins with 100% accuracy given max A semantic cues. Pt demonstrated continue increase of verbal errors. Pt was able to utilize the calendar to orientate to month only. Pt was left in room with call bell within reach and bed alarm set. ST recommends to continue skilled ST services.      Pain Pain Assessment Pain Score: 0-No pain  Therapy/Group: Individual Therapy  Aquan Kope  [facility] 03/27/2020, 12:47 PM

## 2020-03-27 NOTE — Progress Notes (Signed)
Occupational Therapy Session Note  Patient Details  Name: [name] MRN: [id number] Date of Birth: [birth year]  Today's Date: 03/27/2020 OT Individual Time: [id number] OT Individual Time Calculation (min): 70 min    Short Term Goals: Week 2:  OT Short Term Goal 1 (Week 2): STGs=LTGs due to ELOS  Skilled Therapeutic Interventions/Progress Updates:    Upon entering the room, pt supine in bed and reports stomach pain all morning. Pt is agreeable to OT intervention. Pt not wanting to leave room secondary to stomach pain and sitting on EOB with supervision for cognitive tasks. Pt presented with 3 sets of pictures this session for problem solving 4 step sequencing. The tasks presented on pictures was cooking an egg, making a sandwich, and heating up soup. When pt was presented with four pictures to place into sequence she needed maximal verbal guidance cuing to determine next step. When pt is presented with first step of sequence and then asked , "What would you do next?" Pt then verbalized next step and presented with picture of that step. Pt engaged in this task through these three scenarios. Pt then suddenly pushing table away and reporting urge for toileting. Pt ambulating with RW and supervision into bathroom. Pt having incontinent episode of urine and bowel on clothing. Pt doffing clothing and cleaning self with supervision overall. Clean brief and LB clothing donned while seated on commode. Pt returning to bed at end of session. Call bell and all needed items within reach upon exiting the room.   Therapy Documentation Precautions:  Precautions Precautions: Fall Precaution Comments: cortrak Restrictions Weight Bearing Restrictions: No General:   Vital Signs: Therapy Vitals Pulse Rate: 92 BP: (!) 118/59 Patient Position (if appropriate): Sitting Pain: Pain Assessment Pain Score: 0-No pain ADL: ADL Grooming: Minimal cueing Where Assessed-Grooming: Sitting at sink Upper Body  Bathing: Moderate cueing Where Assessed-Upper Body Bathing: Sitting at sink Lower Body Bathing: Maximal assistance Where Assessed-Lower Body Bathing: Sitting at sink, Standing at sink Upper Body Dressing: Moderate assistance(for threadind d/t PICC and cortrak) Where Assessed-Upper Body Dressing: Sitting at sink Lower Body Dressing: Maximal assistance Where Assessed-Lower Body Dressing: Standing at sink, Sitting at sink Toileting: Maximal assistance Where Assessed-Toileting: Bedside Commode Toilet Transfer: Moderate assistance Toilet Transfer Method: Stand pivot Toilet Transfer Equipment: Art gallery manager    Praxis   Exercises:   Other Treatments:     Therapy/Group: Individual Therapy  [name] 03/27/2020, 12:52 PM

## 2020-03-28 ENCOUNTER — Inpatient Hospital Stay (HOSPITAL_COMMUNITY): Payer: Medicare HMO

## 2020-03-28 ENCOUNTER — Inpatient Hospital Stay (HOSPITAL_COMMUNITY): Payer: Medicare HMO | Admitting: Physical Therapy

## 2020-03-28 ENCOUNTER — Encounter (HOSPITAL_COMMUNITY): Payer: Self-pay

## 2020-03-28 ENCOUNTER — Inpatient Hospital Stay (HOSPITAL_COMMUNITY): Payer: Medicare HMO | Admitting: Occupational Therapy

## 2020-03-28 NOTE — Plan of Care (Signed)
  Problem: Consults Goal: RH GENERAL PATIENT EDUCATION Description: See Patient Education module for education specifics. Educate patient on safety Outcome: Progressing Goal: Skin Care Protocol Initiated - if Braden Score 18 or less Description:  N/A Outcome: Progressing   Problem: RH BLADDER ELIMINATION Goal: RH STG MANAGE BLADDER WITH ASSISTANCE Description: STG Manage Bladder With Mod I Assistance Outcome: Progressing   Problem: RH SKIN INTEGRITY Goal: RH STG SKIN FREE OF INFECTION/BREAKDOWN Description: Skin to remain free of breakdown or infection during rehab stay with min assist Outcome: Progressing Goal: RH STG MAINTAIN SKIN INTEGRITY WITH ASSISTANCE Description: STG Maintain Skin Integrity With [employer]. Outcome: Progressing   Problem: RH SAFETY Goal: RH STG ADHERE TO SAFETY PRECAUTIONS W/ASSISTANCE/DEVICE Description: STG Adhere to Safety Precautions With Mod I Assistance/Device walker. Outcome: Progressing   Problem: RH PAIN MANAGEMENT Goal: RH STG PAIN MANAGED AT OR BELOW PT'S PAIN GOAL Description: Pain goal of 3 on scale of 0-10, using facial recognition  Outcome: Progressing   Problem: Consults Goal: RH STROKE PATIENT EDUCATION Description: See Patient Education module for education specifics  Outcome: Progressing

## 2020-03-28 NOTE — Progress Notes (Signed)
Patient ID: [name], female   DOB: Jan 01, 1946, 74 y.o.   MRN: [id number]  Patient declined by [facility]

## 2020-03-28 NOTE — Progress Notes (Signed)
Physical Therapy Session Note  Patient Details  Name: [name] MRN: [id number] Date of Birth: [birth year]  Today's Date: 03/28/2020 PT Individual Time: 1105-1200 and 1415-1445 PT Individual Time Calculation (min): 55 min and 30 min  Short Term Goals: Week 3:  PT Short Term Goal 1 (Week 3): STG=LTG due to ELOS  Skilled Therapeutic Interventions/Progress Updates: Tx1: Pt presented in bed agreeable to therapy. Pt denies pain during session. Pt expressed had recently used bathroom so declined when asked. Performed bed mobility mod I and performed stand pivot to w/c no AD and supervision. Pt transported to ortho gym for energy conservation. Participated in car transfer, gait up ramp, and gait on mulch with RW and supervision overall. Transported to rehab gym and participated in ascending/descending x 4 steps with HHA only and CGA. Pt required min cues for sequencing when descending but no additional cues needed. Participated in standing balance with use of rebounder no AD with supervision overall and pt able to reach to floor to pick up ball. Pt participated in obstacle course including compliant surface, weaving through cones, stepping over thresholds and stepping onto 4in curb. Pt was able to manage all activities with CGA/close S however notable fatigue nearing completion of course with pt demonstrating increased dyspnea upon completion. Pt transferred back to w/c no AD with supervision and transported back to room. Performed ambulatory transfer to bed no AD close S and transferred back to bed mod I. Pt left in bed at end of session with bed alarm on and current needs met.   Tx2: Pt presented in bed agreeable to therapy. Session focused on ambulation in community environment. Pt ambulated to w/c no AD with supervision. Pt transported to [facility] entrance for energy conservation. Pt ambulated distances in courtyard of 75-178f with seated rest breaks between bouts. Pt able to perform all ambulation with  supervision. Pt also ambulated through entrance with RW with min cues for safety over thresholds. After seated rest pt ambulated in and around gift shop with supervision with pt safety navigating around obstacles and in tight spaces. Pt transported back to room and performed ambulatory transfer no AD to bed supervision level. Pt returned to bed and left with bed alarm on, call bell within reach and needs met.      Therapy Documentation Precautions:  Precautions Precautions: Fall Precaution Comments: cortrak Restrictions Weight Bearing Restrictions: No General:   Vital Signs: Therapy Vitals Pulse Rate: 85 Resp: 18 BP: (!) 115/58 Patient Position (if appropriate): Sitting Oxygen Therapy SpO2: 99 % O2 Device: Room Air Pain: Pain Assessment Pain Scale: 0-10 Pain Score: 0-No pain Mobility: Bed Mobility Bed Mobility: Rolling Left;Rolling Right;Sit to Supine;Supine to Sit Rolling Right: Independent with assistive device Rolling Left: Independent with assistive device Supine to Sit: Independent with assistive device Sit to Supine: Independent with assistive device Transfers Transfers: Stand to Sit;Sit to Stand;Stand Pivot Transfers Sit to Stand: Supervision/Verbal cueing Stand to Sit: Supervision/Verbal cueing Stand Pivot Transfers: Supervision/Verbal cueing Transfer (Assistive device): Rolling walker Locomotion : Gait Ambulation: Yes Gait Assistance: Supervision/Verbal cueing Gait Distance (Feet): 150 Feet Assistive device: Rolling walker Gait Gait: Yes Gait Pattern: Impaired Gait Pattern: Narrow base of support Gait velocity: decreased Stairs / Additional Locomotion Stairs: Yes Stairs Assistance: Contact Guard/Touching assist Stair Management Technique: Other (comment)(HHA) Number of Stairs: 4 Height of Stairs: 6 Ramp: Supervision/Verbal cueing Wheelchair Mobility Wheelchair Mobility: No  Trunk/Postural Assessment : Cervical Assessment Cervical Assessment:  Exceptions to WFL(forward head) Thoracic Assessment Thoracic Assessment: Exceptions to WFL(rounded shoulders)  Lumbar Assessment Lumbar Assessment: Exceptions to WFL(posterior pelvic tilt) Postural Control Postural Control: Within Functional Limits  Balance: Balance Balance Assessed: Yes Static Sitting Balance Static Sitting - Level of Assistance: 6: Modified independent (Device/Increase time) Dynamic Sitting Balance Dynamic Sitting - Level of Assistance: 6: Modified independent (Device/Increase time) Sitting balance - Comments: pt able to reach to floor to pick up shoes from EOB Static Standing Balance Static Standing - Level of Assistance: 5: Stand by assistance Dynamic Standing Balance Dynamic Standing - Level of Assistance: 5: Stand by assistance Exercises:   Other Treatments:      Therapy/Group: Individual Therapy    03/28/2020, 12:22 PM

## 2020-03-28 NOTE — Progress Notes (Signed)
[name]   DOB:1946/08/02   PJ#:[id number]   BHA#:[id number]  Subjective:  [name] is continuing to make progress.  She is able to swallow pills without any difficulty.  No bleeding has been reported.  Working with therapy anticipate discharge tomorrow.  Objective:  Vitals:   03/28/20 0845 03/28/20 1301  BP:  (!) 95/50  Pulse: 85 91  Resp: 18 16  Temp:  98.3 F (36.8 C)  SpO2: 99% 96%    Body mass index is 27.02 kg/m.  Intake/Output Summary (Last 24 hours) at 03/28/2020 1437 Last data filed at 03/28/2020 1308 Gross per 24 hour  Intake 360 ml  Output --  Net 360 ml   Sclerae unicteric, EOMs intact Lungs no rales or rhonchi, auscultated anterolaterally Heart regular rate and rhythm Abd soft, nontender, positive bowel sounds Neuro: nonfocal, well oriented, appropriate affect    Ref Range & Units 7 d ago 2 wk ago  Heparin Induced Plt Ab 0.000 - 0.400 OD 1.469High   2.553High  CM        CBG (last 3)  No results for input(s): GLUCAP in the last 72 hours.   Labs:  Lab Results  Component Value Date   WBC 4.6 03/27/2020   HGB 9.3 (L) 03/27/2020   HCT 29.7 (L) 03/27/2020   MCV 103.5 (H) 03/27/2020   PLT 438 (H) 03/27/2020   NEUTROABS 11.4 (H) 03/09/2020    _0 @  Urine Studies No results for input(s): UHGB, CRYS in the last 72 hours.  Invalid input(s): UACOL, UAPR, USPG, UPH, UTP, UGL, UKET, UBIL, UNIT, UROB, ULEU, UEPI, UWBC, URBC, UBAC, CAST, UCOM, BILUA  Basic Metabolic Panel: Recent Labs  Lab 03/27/20 0517  NA 136  K 4.0  CL 106  CO2 21*  GLUCOSE 115*  BUN 15  CREATININE 0.78  CALCIUM 8.9   GFR Estimated Creatinine Clearance: 56.3 mL/min (by C-G formula based on SCr of 0.78 mg/dL). Liver Function Tests: No results for input(s): AST, ALT, ALKPHOS, BILITOT, PROT, ALBUMIN in the last 168 hours. No results for input(s): LIPASE, AMYLASE in the last 168 hours. No results for input(s): AMMONIA in the last 168 hours. Coagulation  profile No results for input(s): INR, PROTIME in the last 168 hours.  CBC: Recent Labs  Lab 03/22/20 0533 03/23/20 0449 03/24/20 0414 03/27/20 0517  WBC 5.5 4.9 6.1 4.6  HGB 9.8* 9.1* 9.1* 9.3*  HCT 32.2* 30.4* 29.8* 29.7*  MCV 105.6* 104.8* 104.6* 103.5*  PLT 461* 433* 438* 438*   Cardiac Enzymes: No results for input(s): CKTOTAL, CKMB, CKMBINDEX, TROPONINI in the last 168 hours. BNP: Invalid input(s): POCBNP CBG: Recent Labs  Lab 03/22/20 1144 03/22/20 1638 03/22/20 2127 03/23/20 0628 03/23/20 1241  GLUCAP 146* 89 157* 105* 108*   D-Dimer No results for input(s): DDIMER in the last 72 hours. Hgb A1c No results for input(s): HGBA1C in the last 72 hours. Lipid Profile Recent Labs    03/27/20 0517  TRIG 81   Thyroid function studies No results for input(s): TSH, T4TOTAL, T3FREE, THYROIDAB in the last 72 hours.  Invalid input(s): FREET3 Anemia work up No results for input(s): VITAMINB12, FOLATE, FERRITIN, TIBC, IRON, RETICCTPCT in the last 72 hours. Microbiology No results found for this or any previous visit (from the past 240 hour(s)).    Studies:  No results found.  Assessment/plan: 74 y.o. [name] presenting 02/13/2020 with headache, found to have biventricular bleeds in the setting of panmyelosis, subsequently with pulmonary emboli and sinus venous thromboses in  the setting of heparin induced thrombocytopenia   (1) polycythemia vera  (a) JAK2 mutation positive confirming diagnosis  (b) bone marrow biopsy 02/13/2020 shows no evidence of leukemia, c/w P Vera  (c)  leukapheresis x3 . Last leukapheresis Friday 02/18/2020   (i) HD catheter removed after pheresis on  02/18/2020  (d) hydrea as needed to keep HCT <45 and platelets in normal range   (2) heparin induced thrombocytopenia: positive HIT screen x2  (a) severe thrombocythemia noted 02/24/2020  (b) bilateral pulmonary emboli 02/22/2020   (i) s/p IVC filter placement 02/22/2020   (ii)  atrial fibrillation with RVR  (c) venous sinus thromboses 02/24/2020   (i) s/p mechanical thrombectomy  (d) argatroban started 02/25/2020  (e) platelet count responded to heparin withdrawal  (f) confirmatory serotonin release assay negative (see discussion below)   (i) continue argatroban for anticoagulation, no heparin   (ii) repeat HIT screen at [facility] lab again strongly positive  (g) platelets normalized as of 03/08/2020  (3) drug-induced thrombocytopenia  (a) discontinued amiodarone, inessential meds   (i) remains in sinus  (b) resolved-- platelets now elevated  (4) anemia: reticulocytes increasing; no folate, B-12 or iron deficiency; normal renal function; LDH slightly elevated but negative DAT and normal t bil (no evidence of hemolysis)--likely patient has occult bleeding--resolving--continue protonix  Plan: [name] continues to progress with her therapy.  She is scheduled for discharge on 03/29/2020.  She is currently on Xarelto and tolerating oral anticoagulation well.  She has no bleeding.  Her hemoglobin and platelets remain stable overall.  She should continue on Xarelto and Hydrea upon discharge.  We will arrange for outpatient follow-up at the cancer center next week.  [name], NP 03/28/2020  2:37 PM Medical Oncology and Hematology [facility] [address] [place], [place] [address] Tel. [phone]    Fax. [phone]

## 2020-03-28 NOTE — Progress Notes (Signed)
Physical Therapy Discharge Summary  Patient Details  Name: [name] MRN: [id number] Date of Birth: 1946-02-23  Today's Date: 03/28/2020  Patient has met 9 of 9 long term goals due to improved activity tolerance, improved balance, improved postural control, increased strength, improved attention, improved awareness and improved coordination.  Patient to discharge at an ambulatory level Supervision.   Patient's care partner is independent to provide the necessary physical and cognitive assistance at discharge.  Reasons goals not met: N/A all goals met  Recommendation:  Patient will benefit from ongoing skilled PT services in home health setting to continue to advance safe functional mobility, address ongoing impairments in balance, transfer, safety, cognition, and minimize fall risk.  Equipment: RW  Reasons for discharge: treatment goals met  Patient/family agrees with progress made and goals achieved: Yes  PT Discharge Precautions/Restrictions Precautions Precautions: Fall Restrictions Weight Bearing Restrictions: No Vital Signs Therapy Vitals Pulse Rate: 85 Resp: 18 BP: (!) 115/58 Patient Position (if appropriate): Sitting Oxygen Therapy SpO2: 99 % O2 Device: Room Air Pain Pain Assessment Pain Scale: 0-10 Pain Score: 0-No pain  Cognition Overall Cognitive Status: Impaired/Different from baseline Arousal/Alertness: Awake/alert Orientation Level: Oriented to person;Oriented to place;Oriented to situation;Disoriented to time Sensation Sensation Light Touch: Appears Intact Coordination Gross Motor Movements are Fluid and Coordinated: Yes Fine Motor Movements are Fluid and Coordinated: Yes Motor  Motor Motor: Ataxia;Motor apraxia Motor - Discharge Observations: Improved ataxia, mild motor apraxia noted with increased fatigue  Mobility Bed Mobility Bed Mobility: Rolling Left;Rolling Right;Sit to Supine;Supine to Sit Rolling Right: Independent with assistive  device Rolling Left: Independent with assistive device Supine to Sit: Independent with assistive device Sit to Supine: Independent with assistive device Transfers Transfers: Stand to Sit;Sit to Stand;Stand Pivot Transfers Sit to Stand: Supervision/Verbal cueing Stand to Sit: Supervision/Verbal cueing Stand Pivot Transfers: Supervision/Verbal cueing Transfer (Assistive device): Rolling walker Locomotion  Gait Ambulation: Yes Gait Assistance: Supervision/Verbal cueing Gait Distance (Feet): 150 Feet Assistive device: Rolling walker Gait Gait: Yes Gait Pattern: Impaired Gait Pattern: Narrow base of support Gait velocity: decreased Stairs / Additional Locomotion Stairs: Yes Stairs Assistance: Contact Guard/Touching assist Stair Management Technique: Other (comment)(HHA) Number of Stairs: 4 Height of Stairs: 6 Ramp: Supervision/Verbal cueing Wheelchair Mobility Wheelchair Mobility: No  Trunk/Postural Assessment  Cervical Assessment Cervical Assessment: Exceptions to WFL(forward head) Thoracic Assessment Thoracic Assessment: Exceptions to WFL(rounded shoulders) Lumbar Assessment Lumbar Assessment: Exceptions to WFL(posterior pelvic tilt) Postural Control Postural Control: Within Functional Limits  Balance Balance Balance Assessed: Yes Static Sitting Balance Static Sitting - Level of Assistance: 6: Modified independent (Device/Increase time) Dynamic Sitting Balance Dynamic Sitting - Level of Assistance: 6: Modified independent (Device/Increase time) Sitting balance - Comments: pt able to reach to floor to pick up shoes from EOB Static Standing Balance Static Standing - Level of Assistance: 5: Stand by assistance Dynamic Standing Balance Dynamic Standing - Level of Assistance: 5: Stand by assistance Extremity Assessment  RUE Assessment RUE Assessment: Within Functional Limits LUE Assessment LUE Assessment: Within Functional Limits RLE Assessment RLE Assessment: Within  Functional Limits General Strength Comments: grossly 4+/5 LLE Assessment LLE Assessment: Within Functional Limits General Strength Comments: grossly 4+/5    Rosita DeChalus 03/28/2020, 12:23 PM

## 2020-03-28 NOTE — Progress Notes (Signed)
[facility] PHYSICAL MEDICINE & REHABILITATION PROGRESS NOTE  Subjective/Complaints: Patient seen sitting up in bed this AM with her son and husband at bedside. They have several questions about discharge. She otherwise has no complaints. PICC site healing well.   ROS: LLimited due to cognitive/behavioral, but appears to deny CP, shortness of breath, nausea, vomiting and diarrhea.  Objective: Vital Signs: Blood pressure (!) 115/58, pulse 85, temperature 98.5 F (36.9 C), resp. rate 18, weight 67 kg, SpO2 99 %. No results found. Recent Labs    03/27/20 0517  WBC 4.6  HGB 9.3*  HCT 29.7*  PLT 438*   Recent Labs    03/27/20 0517  NA 136  K 4.0  CL 106  CO2 21*  GLUCOSE 115*  BUN 15  CREATININE 0.78  CALCIUM 8.9    Physical Exam: BP (!) 115/58 (BP Location: Right Arm)   Pulse 85   Temp 98.5 F (36.9 C)   Resp 18   Wt 67 kg   SpO2 99%   BMI 27.02 kg/m  Constitutional: No distress . Vital signs reviewed. HENT: Normocephalic.  Atraumatic. Eyes: EOMI. No discharge. Cardiovascular: No JVD. Respiratory: Normal effort.  No stridor. GI: Non-distended. Skin: Warm and dry.  Intact. PICC site healing well.  Psych: Normal mood.  Normal behavior. Musc: No edema in extremities.  No tenderness in extremities. Neuro: Alert Motor: Grossly 5/5 throughout  Assessment/Plan: 1. Functional deficits secondary to R>L brain bleeds which require 3+ hours per day of interdisciplinary therapy in a comprehensive inpatient rehab setting.  Physiatrist is providing close team supervision and 24 hour management of active medical problems listed below.  Physiatrist and rehab team continue to assess barriers to discharge/monitor patient progress toward functional and medical goals  Care Tool:  Bathing    Body parts bathed by patient: Left arm, Chest, Abdomen, Front perineal area, Right upper leg, Left upper leg, Face, Right lower leg, Right arm, Left lower leg, Buttocks   Body parts  bathed by helper: Buttocks     Bathing assist Assist Level: Supervision/Verbal cueing     Upper Body Dressing/Undressing Upper body dressing   What is the patient wearing?: Pull over shirt    Upper body assist Assist Level: Supervision/Verbal cueing    Lower Body Dressing/Undressing Lower body dressing      What is the patient wearing?: Incontinence brief, Pants     Lower body assist Assist for lower body dressing: Supervision/Verbal cueing     Toileting Toileting    Toileting assist Assist for toileting: Supervision/Verbal cueing     Transfers Chair/bed transfer  Transfers assist     Chair/bed transfer assist level: Supervision/Verbal cueing Chair/bed transfer assistive device: Programmer, multimedia   Ambulation assist      Assist level: Supervision/Verbal cueing Assistive device: Walker-rolling Max distance: 162ft   Walk 10 feet activity   Assist     Assist level: Supervision/Verbal cueing Assistive device: Walker-rolling   Walk 50 feet activity   Assist Walk 50 feet with 2 turns activity did not occur: Safety/medical concerns  Assist level: Supervision/Verbal cueing Assistive device: Walker-rolling    Walk 150 feet activity   Assist Walk 150 feet activity did not occur: Safety/medical concerns  Assist level: Supervision/Verbal cueing Assistive device: Walker-rolling    Walk 10 feet on uneven surface  activity   Assist Walk 10 feet on uneven surfaces activity did not occur: Safety/medical concerns   Assist level: Supervision/Verbal cueing     Wheelchair  Assist Will patient use wheelchair at discharge?: No(No PT LTG set)   Wheelchair activity did not occur: Safety/medical concerns         Wheelchair 50 feet with 2 turns activity    Assist    Wheelchair 50 feet with 2 turns activity did not occur: Safety/medical concerns       Wheelchair 150 feet activity     Assist Wheelchair 150 feet  activity did not occur: Safety/medical concerns          Medical Problem List and Plan: 1.  Deficits with mobility, self-care, cognition secondary to R>L brain bleeds.  Mainly cognitive dysfunction more so than weakness.  Continue CIR 2.  PE/Antithrombotics: IVC to remain in place repeat scan in 3 months-anticoagulation:  Pharmaceutical: Other (comment)per heme, switched to Xarelto              -antiplatelet therapy: N/A 3. Heaches/Pain Management: On depakote tid for agitation as well as HA.     Resolved             Monitor with increased emulation 4. Mood: LCSW to follow for evaluation and support.              -antipsychotic agents: N/A 5. Neuropsych: This patient is not capable of making decisions on her own behalf.  Continue telesitter for safety  6. Skin/Wound Care: Routine pressure relief measures. PICC site CDI  7. Fluids/Electrolytes/Nutrition: Monitor I/Os.             Advance diet as tolerated 8. 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 Decadron tapered to 4 mg bid on 04/27--slow taper to off.   9. Polycythemia Vera: JAK2 mutation positive. S/p Leukapheresis X 3. Hydrea prn to keep HCT<45   Hematocrit 29.7 on 5/17 10. HIT:  SRA negative but HIT screen at [facility] strongly positive.   Thrombocytopenia resolved now with mild thrombo cytosis , Heme following  11. PAF/A fib with RVR:  Felt to be due to illness . Current recommendations are for [facility] for 6-9 months.   HR controlled on 5/18 at 85             Monitor with increased activity 12.  Steroid-induced hyperglycemia resolved off steroids  13. Delirium:  With sundowning. On amantadine for activation and Depakote for agitation. Low dose Seroquel prn  Improved 14. Anemia: Continue to monitor with daily labs.   Hemoglobin 9.3 on 5/17  Hemoccult negative  PPI 15.  Post stroke dysphagia  Dysphagia 3, thin liquids.  P.o. intake  improving  Advance diet as tolerated 16.  Essential hypertension  Continue losartan 50 twice daily   Vitals:   03/28/20 0830 03/28/20 0845  BP: (!) 115/58   Pulse: 85 85  Resp:  18  Temp:    SpO2:  99%   Decrease hydralazine to 50mg  TID  Controlled on 5/17 18.  Severe hypoalbuminemia  Supplement initiated- expect slow improvement      LOS: 18 days A FACE TO FACE EVALUATION WAS PERFORMED  Adib Wahba P Aneudy Champlain 03/28/2020, 11:59 AM

## 2020-03-28 NOTE — Progress Notes (Signed)
Speech Language Pathology Discharge Summary  Patient Details  Name: [name] MRN: [id number] Date of Birth: November 18, 1945  Today's Date: 03/28/2020 SLP Individual Time: 5038-8828 SLP Individual Time Calculation (min): 56 min   Skilled Therapeutic Interventions:  Skilled ST services focused on swallow and language skills. Pt consumed regular textured snack with mod I use of swallow precautions, appropriately oral clearance and swallow appeared timely. SLP discharged dysphagia goals. SLP facilitated express and receptive language reassessment given subsections of WAB and LARK, pt demonstrated ability to response to complex yes/no questions, name common objects with min A semantic cues, repeat at phrase level with min A verbal cues, follow 1 step commands and follow 2 step commands with 70% accuracy given min A verbal/visual cues. SLP facilitated immediate and delayed recall of novel information, pt recalled 3 out 4 words immediately and 0 out 4 words given max A verbal cues with 5 minute delay. Pt required max A verbal/visual cues to complete 3 step sequence task targeting basic problem solving. SLP provided education on progress and continued need for treatment, all questions were answered to satisfaction. Pt was left in room with call bell within reach and bed  alarm set.    Patient has met 6 of 6 long term goals.  Patient to discharge at overall Independent;Min;Mod level.  Reasons goals not met:     Clinical Impression/Discharge Summary:   Pt had made great progress meeting 6 out 6 goals, discharging at min A for expressive language, Min-Mod A for receptive language, min A for sustained attention, mod A for basic problem solving and independent with swallow strategies with regular textures and thin liquids. SLP has DC swallow goals due to cognitive basis and improved mentation. Pt is able to communicate wants/needs at sentence level (emerging conversation level) with min A verbal cues for  clarification of message. Pt is able to response to complex yes/no questions, name common objects with min A semantic cues, repeat at phrase level with min A verbal cues, identify requested object by name/word level with min A semantic cues, write at sentence level, follow 2 step commands with 70% accuracy, immediate recall 3 out 4 words and delayed recall 0 out 4 words. Pt's speech is fluent characterized by word finding difficulties, semantic paraphasia and awareness of majority of verbal errors. Pt's receptive language is further impacted by impaired recall and problem solving abilities. Pt benefited from skills ST services in order to maximumize functional indepdence and reduce burden of care, requring 24 hour supervision and continued ST services.   Care Partner:  Caregiver Able to Provide Assistance: Yes  Type of Caregiver Assistance: Physical;Cognitive  Recommendation:  Home Health SLP;24 hour supervision/assistance  Rationale for SLP Follow Up: Maximize functional communication;Maximize cognitive function and independence;Reduce caregiver burden   Equipment: N/A   Reasons for discharge: Discharged from hospital   Patient/Family Agrees with Progress Made and Goals Achieved: Yes    Nike Southers  [facility] 03/28/2020, 3:58 PM

## 2020-03-28 NOTE — Progress Notes (Signed)
Occupational Therapy Discharge Summary  Patient Details  Name: [name] MRN: [id number] Date of Birth: [birth year]  Today's Date: 03/28/2020 OT Individual Time: 1000-1055 OT Individual Time Calculation (min): 55 min    Patient has met 12 of 12 long term goals due to improved activity tolerance, improved balance, postural control, ability to compensate for deficits, improved attention, improved awareness and improved coordination.  Patient to discharge at overall Supervision level.  Patient's care partner is independent to provide the necessary cognitive assistance at discharge.    Reasons goals not met: all goals met  Recommendation:  Patient will benefit from ongoing skilled OT services in home health setting to continue to advance functional skills in the area of BADL and iADL.  Equipment: No equipment provided. They purchased recommended TTB on their own  Reasons for discharge: treatment goals met  Patient/family agrees with progress made and goals achieved: Yes   OT Intervention: Upon entering the room, pt supine in bed with no c/o pain. Pt is agreeable to OT intervention. Pt verbalized need for toileting and ambulates with RW with supervision for toileting needs. Pt doffing clothing items while seated on commode with supervision and transfers onto shower seat for bathing tasks at supervision level with min A for sequencing. Pt exiting the bathroom and returned to sitting on EOB. Pt initially attempting to don dirty clothing but when gives cues she dons clean clothing she placed on the bed. Pt needing increased time secondary to fatigue with tasks. Pt remained returning to supine to rest once dressed and donned B socks with figure four position while supine. Pt with no further concerns at this time. All needs within reach and pt resting in bed.   OT Discharge Precautions/Restrictions  Precautions Precautions: Fall Restrictions Weight Bearing Restrictions: No Vital  Signs Therapy Vitals Pulse Rate: 85 Resp: 18 BP: (!) 115/58 Patient Position (if appropriate): Sitting Oxygen Therapy SpO2: 99 % O2 Device: Room Air Pain Pain Assessment Pain Scale: 0-10 Pain Score: 0-No pain ADL ADL Grooming: Minimal cueing Where Assessed-Grooming: Sitting at sink Upper Body Bathing: Moderate cueing Where Assessed-Upper Body Bathing: Sitting at sink Lower Body Bathing: Maximal assistance Where Assessed-Lower Body Bathing: Sitting at sink, Standing at sink Upper Body Dressing: Moderate assistance(for threadind d/t PICC and cortrak) Where Assessed-Upper Body Dressing: Sitting at sink Lower Body Dressing: Maximal assistance Where Assessed-Lower Body Dressing: Standing at sink, Sitting at sink Toileting: Maximal assistance Where Assessed-Toileting: Bedside Commode Toilet Transfer: Moderate assistance Toilet Transfer Method: Stand pivot Toilet Transfer Equipment: Bedside commode Vision Baseline Vision/History: Wears glasses Wears Glasses: At all times Patient Visual Report: No change from baseline Cognition   Sensation Sensation Light Touch: Appears Intact Coordination Gross Motor Movements are Fluid and Coordinated: Yes Fine Motor Movements are Fluid and Coordinated: Yes Motor  Motor Motor: Ataxia;Motor apraxia Motor - Discharge Observations: Improved ataxia, mild motor apraxia noted with increased fatigue Mobility  Bed Mobility Bed Mobility: Rolling Left;Rolling Right;Sit to Supine;Supine to Sit Rolling Right: Independent with assistive device Rolling Left: Independent with assistive device Supine to Sit: Independent with assistive device Sit to Supine: Independent with assistive device Transfers Sit to Stand: Supervision/Verbal cueing Stand to Sit: Supervision/Verbal cueing  Trunk/Postural Assessment  Cervical Assessment Cervical Assessment: Exceptions to WFL(forward head) Thoracic Assessment Thoracic Assessment: Exceptions to WFL(rounded  shoulders) Lumbar Assessment Lumbar Assessment: Exceptions to WFL(posterior pelvic tilt) Postural Control Postural Control: Within Functional Limits  Balance Balance Balance Assessed: Yes Static Sitting Balance Static Sitting - Level of Assistance: 6: Modified independent (  Device/Increase time) Dynamic Sitting Balance Dynamic Sitting - Level of Assistance: 6: Modified independent (Device/Increase time) Sitting balance - Comments: pt able to reach to floor to pick up shoes from EOB Static Standing Balance Static Standing - Level of Assistance: 5: Stand by assistance Dynamic Standing Balance Dynamic Standing - Level of Assistance: 5: Stand by assistance Extremity/Trunk Assessment RUE Assessment RUE Assessment: Within Functional Limits LUE Assessment LUE Assessment: Within Functional Limits   [name] 03/28/2020, 10:37 AM

## 2020-03-28 NOTE — Progress Notes (Signed)
Patient ID: [name], female   DOB: 07/29/1946, 74 y.o.   MRN: [id number]  [name] verifying patient insurance to be considered as follow up [facility]

## 2020-03-29 ENCOUNTER — Telehealth: Payer: Self-pay | Admitting: Oncology

## 2020-03-29 MED ORDER — FLUTICASONE FUROATE-VILANTEROL 200-25 MCG/INH IN AEPB: 1.0000 | INHALATION_SPRAY | Freq: Every day | RESPIRATORY_TRACT | 0 refills | Status: DC

## 2020-03-29 MED ORDER — VALPROIC ACID 250 MG/5ML PO SOLN: 250.0000 mg | Freq: Two times a day (BID) | ORAL | 0 refills | Status: DC

## 2020-03-29 MED ORDER — LOSARTAN POTASSIUM 50 MG PO TABS: 50.0000 mg | ORAL_TABLET | Freq: Two times a day (BID) | ORAL | 0 refills | Status: DC

## 2020-03-29 MED ORDER — ATORVASTATIN CALCIUM 10 MG PO TABS: 10.0000 mg | ORAL_TABLET | Freq: Every day | ORAL | 0 refills | Status: DC

## 2020-03-29 MED ORDER — SENNOSIDES-DOCUSATE SODIUM 8.6-50 MG PO TABS: 2.0000 | ORAL_TABLET | Freq: Two times a day (BID) | ORAL | 0 refills | Status: DC

## 2020-03-29 MED ORDER — HYDROXYUREA 500 MG PO CAPS: 500.0000 mg | ORAL_CAPSULE | Freq: Every day | ORAL | 0 refills | Status: DC

## 2020-03-29 MED ORDER — AMLODIPINE BESYLATE 10 MG PO TABS: 10.0000 mg | ORAL_TABLET | Freq: Every day | ORAL | 0 refills | Status: DC

## 2020-03-29 MED ORDER — UMECLIDINIUM BROMIDE 62.5 MCG/INH IN AEPB: 1.0000 | INHALATION_SPRAY | Freq: Every day | RESPIRATORY_TRACT | 0 refills | Status: DC

## 2020-03-29 MED ORDER — METHYLPHENIDATE HCL 5 MG PO TABS: 5.0000 mg | ORAL_TABLET | Freq: Two times a day (BID) | ORAL | 0 refills | Status: DC

## 2020-03-29 MED ORDER — HYDRALAZINE HCL 50 MG PO TABS: 50.0000 mg | ORAL_TABLET | Freq: Three times a day (TID) | ORAL | 0 refills | Status: DC

## 2020-03-29 MED ORDER — LEVOTHYROXINE SODIUM 125 MCG PO TABS: 125.0000 ug | ORAL_TABLET | Freq: Every day | ORAL | 0 refills | Status: DC

## 2020-03-29 MED ORDER — QUETIAPINE FUMARATE 25 MG PO TABS: 25.0000 mg | ORAL_TABLET | Freq: Every evening | ORAL | 0 refills | Status: DC | PRN

## 2020-03-29 MED ORDER — FUROSEMIDE 20 MG PO TABS: 20.0000 mg | ORAL_TABLET | Freq: Every day | ORAL | 0 refills | Status: DC

## 2020-03-29 MED ORDER — POTASSIUM CHLORIDE 20 MEQ PO PACK: 20.0000 meq | PACK | Freq: Two times a day (BID) | ORAL | 0 refills | Status: DC

## 2020-03-29 MED ORDER — RIVAROXABAN 20 MG PO TABS: 20.0000 mg | ORAL_TABLET | Freq: Every day | ORAL | 0 refills | Status: DC

## 2020-03-29 MED ORDER — PANTOPRAZOLE SODIUM 40 MG PO TBEC: 40.0000 mg | DELAYED_RELEASE_TABLET | Freq: Every day | ORAL | 0 refills | Status: DC

## 2020-03-29 NOTE — Progress Notes (Signed)
Inpatient Rehab Care Coordinator Discharge Note Inpatient Rehabilitation Care Coordinator  Discharge Note  The overall goal for the admission was met for:   Discharge location: Yes  Length of Stay: Yes  Discharge activity level: Yes  Home/community participation: Yes  Services provided included: MD, RD, PT, OT, SLP, RN, CM, TR, Pharmacy, Neuropsych and SW  Financial Services: Other: [employer]  Follow-up services arranged: Home Health: [facility]  Comments (or additional information):  Patient/Family verbalized understanding of follow-up arrangements: Yes  Individual responsible for coordination of the follow-up plan: [name] [phone]  Confirmed correct DME delivered: [name] 03/29/2020    [name]

## 2020-03-29 NOTE — Progress Notes (Signed)
Pt discharged to home with family. Family received discharge instructions from Reesa Chew, [place]. All belongings taken down and questions/concerns addressed.

## 2020-03-29 NOTE — Discharge Summary (Signed)
Physician Discharge Summary  Patient ID: [name] MRN: [id number] DOB/AGE: [id number] y.o.  Admit date: 03/10/2020 Discharge date: 03/29/2020  Discharge Diagnoses:  Principal Problem:   Nontraumatic subcortical hemorrhage of right cerebral hemisphere [facility]) Active Problems:   Embolic stroke [facility])   Vascular headache   Hypoalbuminemia due to protein-calorie malnutrition (HCC)   Anemia   Prediabetes   Peripheral edema   Impulsive   Presence of IVC filter   Benign essential HTN   Cerebral venous sinus thrombosis   Discharged Condition: stable   Significant Diagnostic Studies: CT ABDOMEN PELVIS W CONTRAST  Addendum Date: 03/18/2020   ADDENDUM REPORT: 03/18/2020 09:03 ADDENDUM: CT scan was reviewed in preparation for potential IVC filter retrieval. Unfortunately, the CT images depict extensive bilateral deep venous thrombosis affecting the femoral, profunda femoral, common femoral, external iliac and iliac veins with thrombus extending up the IVC into the IVC filter. No significant thrombus extends into the suprarenal IVC. There are associated perivascular inflammatory changes as well as bilateral lower extremity edema. Findings were discussed with Dr. Jana Hakim by Dr. Laurence Ferrari via telephone on 03/17/2020. Patient is anticoagulated and relatively asymptomatic. Our current plan is to repeat a CT venogram of the abdomen and pelvis in 3 months to reassess thrombus burden. Signed, Criselda Peaches, MD, [facility] Radiology Electronically Signed   By: Jacqulynn Cadet M.D.   On: 03/18/2020 09:03   Result Date: 03/18/2020 CLINICAL DATA:  Dural venous sinus thrombosis suspected agitiation restless for 3 days. recent HIT with PE and cereral venous thrombosiss, would like to rule out other condition EXAM: CT CHEST, ABDOMEN, AND PELVIS WITH CONTRAST TECHNIQUE: Multidetector CT imaging of the chest, abdomen and pelvis was performed  following the standard protocol during bolus administration of intravenous contrast. CONTRAST:  167m OMNIPAQUE IOHEXOL 300 MG/ML  SOLN COMPARISON:  Chest CTA 02/22/2020 FINDINGS: CT CHEST FINDINGS Cardiovascular: Aortic atherosclerosis without aneurysm or dissection. The left vertebral artery arises directly from the aorta, variant arch anatomy. Heart is normal in size. There are coronary artery calcifications. Previous bilateral lower lobe pulmonary emboli are not well assessed on this exam given phase of contrast bolus timing. No pericardial effusion. Mediastinum/Nodes: Enteric tube within the esophagus. No mediastinal or hilar adenopathy. No visualized thyroid nodule. Lungs/Pleura: Breathing motion artifact limits detailed assessment. Moderate emphysema. New patchy, ground-glass and confluent subpleural opacities in the right lower lobe, portions appear slightly wedge like, in the region of pulmonary emboli on prior exam, likely developing pulmonary infarct. A nodular densities in the right lower lobe have otherwise improved. There is mild central bronchial thickening. No significant pleural effusion. No pulmonary edema. Musculoskeletal: Mild chronic T10 superior endplate compression fracture. No evidence of focal bone lesion. CT ABDOMEN PELVIS FINDINGS Hepatobiliary: No focal liver abnormality is seen. Status post cholecystectomy. No biliary dilatation. Portal and hepatic veins appear patent. Pancreas: Unremarkable. No pancreatic ductal dilatation or surrounding inflammatory changes. Spleen: Normal in size without focal abnormality. No evidence of splenic infarct. Adrenals/Urinary Tract: Normal adrenal glands. No hydronephrosis. Homogeneous renal enhancement and symmetric excretion on delayed phase imaging. There is symmetric perinephric edema. 2.2 cm simple cyst in the upper left kidney. Urinary bladder is decompressed by Foley catheter. Stomach/Bowel: Distal colonic diverticulosis without diverticulitis. No  bowel obstruction or inflammation. Appendix appears normal. Stomach is decompressed by enteric tube. No gastric wall thickening. Vascular/Lymphatic: Moderate aortic atherosclerosis. No aortic aneurysm. Portal vein and mesenteric vessels are patent. Infrarenal IVC filter in place. No abdominopelvic adenopathy. Reproductive:  Small posterior uterine calcifications likely calcified fibroid. No suspicious adnexal mass. Other: Generalized body wall edema, most prominent in the flanks. Small amount of nonspecific presacral edema. Mild generalized retroperitoneal edema. No significant ascites. No free air. Small fat containing umbilical hernia. Musculoskeletal: Small sclerotic density within L1 likely bone island. There are no acute or suspicious osseous abnormalities. IMPRESSION: 1. Recent pulmonary embolus with development of pulmonary infarct in the right lower lobe. 2. Nodular opacities in the right lower lobe have otherwise improved, likely improving infection. 3. Generalized body wall edema, with mild retroperitoneal edema, nonspecific. No evidence of other acute finding in the abdomen or pelvis. 4. Colonic diverticulosis without diverticulitis. 5. Aortic atherosclerosis and coronary artery calcifications. Aortic Atherosclerosis (ICD10-I70.0) and Emphysema (ICD10-J43.9). Electronically Signed: By: Keith Rake M.D. On: 03/03/2020 14:59   Labs:  Basic Metabolic Panel: BMP Latest Ref Rng & Units 03/27/2020 03/20/2020 03/19/2020  Glucose 70 - 99 mg/dL 115(H) 123(H) 110(H)  BUN 8 - 23 mg/dL '15 23 19  ' Creatinine 0.44 - 1.00 mg/dL 0.78 0.90 0.84  Sodium 135 - 145 mmol/L 136 137 135  Potassium 3.5 - 5.1 mmol/L 4.0 4.3 4.2  Chloride 98 - 111 mmol/L 106 105 103  CO2 22 - 32 mmol/L 21(L) 22 23  Calcium 8.9 - 10.3 mg/dL 8.9 9.3 9.1    CBC: CBC Latest Ref Rng & Units 03/27/2020 03/24/2020 03/23/2020  WBC 4.0 - 10.5 K/uL 4.6 6.1 4.9  Hemoglobin 12.0 - 15.0 g/dL 9.3(L) 9.1(L) 9.1(L)  Hematocrit 36.0 - 46.0 % 29.7(L)  29.8(L) 30.4(L)  Platelets 150 - 400 K/uL 438(H) 438(H) 433(H)    CBG: No results for input(s): GLUCAP in the last 168 hours.  Brief HPI:   DALAYZA ZAMBRANA is a 74 y.o. female with history of HTN, hep C who was admitted on 02/14/2020 with onset of severe persistent headache and nausea vomiting.  CT head done revealing acute IVH in right lateral ventricle with severe chronic ischemic microangiopathy. CBC revealed pancytosis with Plts -  990, Hgb- 17.7 and WBC - 12.0. Neurology questioned bleed due to HTN v/s CAA v/s MDS.  MRI brain showed stable IVH with extensive T2 hyperintensity within the brain and multi foci throughout that had progressed from prior MRI due to microangiopathy v/s amyloid angiopathy.   Dr. Jana Hakim was consulted for input and felt that panmyelosis was due to polycythemia vera. Bone marrow biopsy was negative for leukemia and JAK2 mutation confirming diagnosis.   She underwent leukapheresis x3 and underwent phlebotomy of 1 unit due to hemoglobin of 17.3.  Hydrea was added to help keep hgb/plts in normal range. Depakote was added to help manage headaches.  She developed shortness of breath due to acute BLL emboli and IVC filter was placed on 04/13.  She did develop A. fib with RVR with runs of NSVT as well as a flutter and was started IV metoprolol and amiodarone for rate control.   Hospital course significant for drop in platelets due to HIT and IV Solu-Medrol added.  CT head done 04/14 due to increasing headaches and showed extensive venous sinus thrombosis. She went mechanical thrombectomy with decrease in clot burden and follow up MRI brain 4/16 showed numerous bilateral acute infarcts in cerebral and cerebellar hemispheres with edema left frontal lobe. She was treated with hypertonic saline,  IV solumedrol and started on argatroban for anticoagulation.  Her cognition continued to fluctuate between lethargy and bouts of agitation.   She did develop drug-induced thrombocytopenia  which was felt to  be related to amiodarone and this was discontinued as heart rate was controlled.  Anemia with drop in hgb to 7.4 was monitored with serial checks and for signs of bleeding.   She required tube feeds for nutritional support due to oropharyngeal dysphagia that appeared to be cognitive in nature.  She was started on dysphagia 1 nectar liquids and hyperglycemia due to steroids was being managed by sliding scale insulin.  Sedating medications were discontinued and amantadine was added to help with activation.  Therapy was ongoing and activity tolerance was improving.  CIR was recommended due to functional decline   Hospital Course: SUKARI GRIST was admitted to rehab 03/10/2020 for inpatient therapies to consist of PT, ST and OT at least three hours five days a week. Past admission physiatrist, therapy team and rehab RN have worked together to provide customized collaborative inpatient rehab. Blood pressures were monitored on TID basis and have started trending down. Hydralazine was decreased to 50 mg tid.  Respiratory status has been stable on current regimen and family advised to continue regimen for now. Serial check of CBC showed that ABLA is slowly improving from 7.8 to 9.3. PPI was added due to concerns of occult bleeding. Stool guaiacs X 3 was negative for occult blood.  Thrombocytopenia has resolved and platelets were trending up. Dr. Jana Hakim has been following for input and low dose hydrea was resumed on 05/05 to prevent thrombocytosis. He also recommended keeping patient on argatroban till removal of IV filter. Radiology was consulted for input and repeat CT abdomen/pelvis showed extensive IVC clot extending up to into the IVC filter. Current plans to repeat scans in three months in hopes that clots would have been cleared from filter so that it could be removed. She was transitioned to Eliquis by 5/13.   Check of lytes revealed that hyponatremia has resolved and renal status is  stable. She was maintained on argatroban till she was tolerating po's consistently. Tube feeds were ongoing due to poor po intake. Her diet was slowly advanced to regular textures with thin liquids. Tube feeds were discontinued on 5/13 and her intake has gradually improved. Multiple nutritional supplements were offered during the day. Her blood sugars have been monitored with ac/hs CBG checks and SSI was use prn for tighter BS control. These have improved with discontinuation of tube feeds and taper off steroids. Patient/family instructed and modified carb intake due to prediabetes.  Delirium has resolved and no signs of agitation noted.   She is continent of bowel and bladder. She has made gains during rehab stay and is currently as supervision level for mobility and requires increased time for functional tasks with min cues for sequencing. She requires min to mod assist for cognitive tasks. She will continue to receive follow up [place] and [facility] by [facility] after discharge.   Rehab course: During patient's stay in rehab weekly team conferences were held to monitor patient's progress, set goals and discuss barriers to discharge. At admission, patient required max assist for ADL tasks and mod assist for mobility. She exhibited transcortical sensory aphasia with halting speech with paraphasias and word finding deficits.  She was able to follow one-step command with required max verbal and tactile cues to complete two-step directions.  She exhibited oropharyngeal dysphagia and declined trials of soft or thin liquids despite verbal encouragement. She  has had improvement in activity tolerance, balance, postural control as well as ability to compensate for deficits. She is able to complete ADL tasks  with supervision. She requires supervision for transfers and to ambulate 150' with RW with verbal cues. She requires min to mod assist for cognitive tasks. She is able to follow 1 and 2 step commands with  70% accuracy and min verbal and visual cues. She is able to name common objects and respond to complex Y/N questions with min semantic cues.    Discharge disposition: 01-Home or Self Care  Diet: Regular. Encourage fluid intake.   Special Instructions: 1. Needs supervision and assistance with cognitive tasks.  2. Repeat CBC/BMET in one week.   Allergies as of 03/29/2020      Reactions   Heparin Other (See Comments)   4/15 HIT OD 2.553, SRA negative > treated as true HIT    Erythromycin Itching, Other (See Comments)   Severe stomach pains, diarrhea      Medication List    STOP taking these medications   amantadine 50 MG/5ML solution Commonly known as: SYMMETREL   argatroban 1 mg/mL Soln   dexamethasone 4 MG/ML injection Commonly known as: DECADRON   feeding supplement (GLUCERNA 1.2 CAL) Liqd   feeding supplement (PRO-STAT SUGAR FREE 64) Liqd   insulin aspart 100 UNIT/ML injection Commonly known as: novoLOG     TAKE these medications   amLODipine 10 MG tablet Commonly known as: NORVASC Take 1 tablet (10 mg total) by mouth daily. What changed: how to take this   atorvastatin 10 MG tablet Commonly known as: LIPITOR Take 1 tablet (10 mg total) by mouth daily. What changed: how to take this   fluticasone furoate-vilanterol 200-25 MCG/INH Aepb Commonly known as: BREO ELLIPTA Inhale 1 puff into the lungs daily.   furosemide 20 MG tablet Commonly known as: LASIX Take 1 tablet (20 mg total) by mouth daily.   hydrALAZINE 50 MG tablet Commonly known as: APRESOLINE Take 1 tablet (50 mg total) by mouth 3 (three) times daily. What changed:   medication strength  how much to take  how to take this   hydroxyurea 500 MG capsule Commonly known as: HYDREA Take 1 capsule (500 mg total) by mouth daily. May take with food to minimize GI side effects.   levothyroxine 125 MCG tablet Commonly known as: SYNTHROID Take 1 tablet (125 mcg total) by mouth daily before  breakfast. What changed: how to take this   losartan 50 MG tablet Commonly known as: COZAAR Take 1 tablet (50 mg total) by mouth 2 (two) times daily. What changed: how to take this   methylphenidate 5 MG tablet Commonly known as: RITALIN Take 1 tablet (5 mg total) by mouth 2 (two) times daily with breakfast and lunch.   pantoprazole 40 MG tablet Commonly known as: PROTONIX Take 1 tablet (40 mg total) by mouth daily.   potassium chloride 20 MEQ packet Commonly known as: KLOR-CON Take 20 mEq by mouth 2 (two) times daily.   QUEtiapine 25 MG tablet Commonly known as: SEROQUEL Take 1 tablet (25 mg total) by mouth at bedtime as needed (for agitation). What changed: how to take this   rivaroxaban 20 MG Tabs tablet Commonly known as: XARELTO Take 1 tablet (20 mg total) by mouth daily with breakfast.   senna-docusate 8.6-50 MG tablet Commonly known as: Senokot-S Take 2 tablets by mouth 2 (two) times daily.   umeclidinium bromide 62.5 MCG/INH Aepb Commonly known as: INCRUSE ELLIPTA Inhale 1 puff into the lungs daily.   valproic acid 250 MG/5ML solution Commonly known as: DEPAKENE Take 5 mLs (250 mg total) by mouth  2 (two) times daily. What changed: how to take this      Follow-up Information    Markus Daft, MD Follow up.   Specialties: Interventional Radiology, Radiology Why: Schedulers will contact you with date and time of appointment for follow-up of IVC filter.  Contact information: [address] [place] [address] [phone]        Kirsteins, [name], MD Follow up.   Specialty: Physical Medicine and Rehabilitation Why: Office will call you with follow up appointment Contact information: [place] [address] [phone]        Magrinat, Virgie Dad, MD Follow up on 04/07/2020.   Specialty: Oncology Why: Appointment at 9 am Contact information: [place] [address] [phone]         [place] Follow up.   Contact information: [address]     [place] [address] [phone]       Cari Caraway, MD. Call on 03/30/2020.   Specialty: Family Medicine Why: for post hospital follow up Contact information: [place] [address] [phone]        Lorretta Harp, MD .   Specialties: Cardiology, Radiology Contact information: [address] [place] [address] [phone]           Signed: Bary Leriche 03/30/2020, 11:23 PM

## 2020-03-29 NOTE — Telephone Encounter (Signed)
Scheduled apt per 5/18 sch message - unable to reach pt . Left message with appt date and time

## 2020-03-29 NOTE — Progress Notes (Signed)
[facility] PHYSICAL MEDICINE [facility] REHABILITATION PROGRESS NOTE  Subjective/Complaints: Appreciate heme note  Discussed D/C plans   ROS: Limited due to cognitive/behavioral, but appears to deny CP, shortness of breath, nausea, vomiting and diarrhea.  Objective: Vital Signs: Blood pressure (!) 110/45, pulse 85, temperature 99 F (37.2 C), temperature source Oral, resp. rate 18, weight 67 kg, SpO2 97 %. No results found. Recent Labs    03/27/20 0517  WBC 4.6  HGB 9.3*  HCT 29.7*  PLT 438*   Recent Labs    03/27/20 0517  NA 136  K 4.0  CL 106  CO2 21*  GLUCOSE 115*  BUN 15  CREATININE 0.78  CALCIUM 8.9    Physical Exam: BP (!) 110/45 (BP Location: Right Arm)   Pulse 85   Temp 99 F (37.2 C) (Oral)   Resp 18   Wt 67 kg   SpO2 97%   BMI 27.02 kg/m   General: No acute distress Mood and affect are appropriate Heart: Regular rate and rhythm no rubs murmurs or extra sounds Lungs: Clear to auscultation, breathing unlabored, no rales or wheezes Abdomen: Positive bowel sounds, soft nontender to palpation, nondistended Extremities: No clubbing, cyanosis, or edema Skin: No evidence of breakdown, no evidence of rash Neurologic: Cranial nerves II through XII intact, motor strength is 5/5 in bilateral deltoid, bicep, tricep, grip, hip flexor, knee extensors, ankle dorsiflexor and plantar flexor  Cerebellar exam normal finger to nose to finger    Musculoskeletal: Full range of motion in all 4 extremities. No joint swelling   Assessment/Plan: 1. Functional deficits secondary to R>L brain bleeds Stable for D/C today F/u PCP in 3-4 weeks F/u PM&R 2 weeks See D/C summary See D/C instructions Care Tool:  Bathing    Body parts bathed by patient: Left arm, Chest, Abdomen, Front perineal area, Right upper leg, Left upper leg, Face, Right lower leg, Right arm, Left lower leg, Buttocks   Body parts bathed by helper: Buttocks     Bathing assist Assist Level: Supervision/Verbal  cueing     Upper Body Dressing/Undressing Upper body dressing   What is the patient wearing?: Pull over shirt    Upper body assist Assist Level: Supervision/Verbal cueing    Lower Body Dressing/Undressing Lower body dressing      What is the patient wearing?: Incontinence brief, Pants     Lower body assist Assist for lower body dressing: Supervision/Verbal cueing     Toileting Toileting    Toileting assist Assist for toileting: Supervision/Verbal cueing     Transfers Chair/bed transfer  Transfers assist     Chair/bed transfer assist level: Supervision/Verbal cueing Chair/bed transfer assistive device: (no AD)   Locomotion Ambulation   Ambulation assist      Assist level: Supervision/Verbal cueing Assistive device: Walker-rolling Max distance: 140ft   Walk 10 feet activity   Assist     Assist level: Supervision/Verbal cueing Assistive device: Walker-rolling   Walk 50 feet activity   Assist Walk 50 feet with 2 turns activity did not occur: Safety/medical concerns  Assist level: Supervision/Verbal cueing Assistive device: Walker-rolling    Walk 150 feet activity   Assist Walk 150 feet activity did not occur: Safety/medical concerns  Assist level: Supervision/Verbal cueing Assistive device: Walker-rolling    Walk 10 feet on uneven surface  activity   Assist Walk 10 feet on uneven surfaces activity did not occur: Safety/medical concerns   Assist level: Supervision/Verbal cueing Assistive device: Aeronautical engineer  Will patient use wheelchair at discharge?: No   Wheelchair activity did not occur: Safety/medical concerns         Wheelchair 50 feet with 2 turns activity    Assist    Wheelchair 50 feet with 2 turns activity did not occur: Safety/medical concerns       Wheelchair 150 feet activity     Assist Wheelchair 150 feet activity did not occur: Safety/medical concerns           Medical Problem List and Plan: 1.  Deficits with mobility, self-care, cognition secondary to R>L brain bleeds.  Mainly cognitive dysfunction more so than weakness. DC home today  2.  PE/Antithrombotics: IVC to remain in place repeat scan in 3 months-anticoagulation:  Pharmaceutical: Other (comment)per heme, switched to Xarelto              -antiplatelet therapy: N/A 3. Heaches/Pain Management: On depakote tid for agitation as well as HA.     Resolved             Monitor with increased emulation 4. Mood: LCSW to follow for evaluation and support.              -antipsychotic agents: N/A 5. Neuropsych: This patient is not capable of making decisions on her own behalf.  Continue telesitter for safety  6. Skin/Wound Care: Routine pressure relief measures. PICC site CDI  7. Fluids/Electrolytes/Nutrition: Monitor I/Os.             Advance diet as tolerated 8. 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 Decadron tapered to 4 mg bid on 04/27--slow taper to off.   9. Polycythemia Vera: JAK2 mutation positive. S/p Leukapheresis X 3. Hydrea prn to keep HCT<45   Hematocrit 29.7 on 5/17 10. HIT:  SRA negative but HIT screen at [facility] strongly positive.   Thrombocytopenia resolved now with mild thrombo cytosis , Heme following  11. PAF/A fib with RVR:  Felt to be due to illness . Current recommendations are for [facility] for 6-9 months.   HR controlled on 5/18 at 85             Monitor with increased activity 12.  Steroid-induced hyperglycemia resolved off steroids  13. Delirium:  With sundowning. On amantadine for activation and Depakote for agitation. Low dose Seroquel prn  Improved 14. Anemia: Continue to monitor with daily labs.   Hemoglobin 9.3 on 5/17  Hemoccult negative  PPI 15.  Post stroke dysphagia  Improved  16.  Essential hypertension  Continue losartan 50 twice daily   Vitals:    03/29/20 0408 03/29/20 0849  BP: (!) 110/45   Pulse: 83 85  Resp: 18 18  Temp: 99 F (37.2 C)   SpO2: 96% 97%   Decrease hydralazine to 50mg  TID- 5/16 may take several days for BP to drift up, will need PCP follow up    18.  Severe hypoalbuminemia  Supplement initiated- expect slow improvement      LOS: 19 days A FACE TO FACE EVALUATION WAS PERFORMED  Charlett Blake 03/29/2020, 8:54 AM

## 2020-03-30 DIAGNOSIS — G08 Intracranial and intraspinal phlebitis and thrombophlebitis: Secondary | ICD-10-CM

## 2020-04-03 DIAGNOSIS — D45 Polycythemia vera: Secondary | ICD-10-CM | POA: Diagnosis not present

## 2020-04-03 DIAGNOSIS — Z7409 Other reduced mobility: Secondary | ICD-10-CM | POA: Diagnosis not present

## 2020-04-03 DIAGNOSIS — I480 Paroxysmal atrial fibrillation: Secondary | ICD-10-CM | POA: Diagnosis not present

## 2020-04-03 DIAGNOSIS — I4892 Unspecified atrial flutter: Secondary | ICD-10-CM | POA: Diagnosis not present

## 2020-04-03 DIAGNOSIS — I69318 Other symptoms and signs involving cognitive functions following cerebral infarction: Secondary | ICD-10-CM | POA: Diagnosis not present

## 2020-04-03 DIAGNOSIS — I10 Essential (primary) hypertension: Secondary | ICD-10-CM | POA: Diagnosis not present

## 2020-04-03 DIAGNOSIS — R1312 Dysphagia, oropharyngeal phase: Secondary | ICD-10-CM | POA: Diagnosis not present

## 2020-04-03 DIAGNOSIS — I69398 Other sequelae of cerebral infarction: Secondary | ICD-10-CM | POA: Diagnosis not present

## 2020-04-03 DIAGNOSIS — R69 Illness, unspecified: Secondary | ICD-10-CM | POA: Diagnosis not present

## 2020-04-05 DIAGNOSIS — D649 Anemia, unspecified: Secondary | ICD-10-CM | POA: Diagnosis not present

## 2020-04-05 DIAGNOSIS — G08 Intracranial and intraspinal phlebitis and thrombophlebitis: Secondary | ICD-10-CM | POA: Diagnosis not present

## 2020-04-05 DIAGNOSIS — I82409 Acute embolism and thrombosis of unspecified deep veins of unspecified lower extremity: Secondary | ICD-10-CM | POA: Diagnosis not present

## 2020-04-05 DIAGNOSIS — I10 Essential (primary) hypertension: Secondary | ICD-10-CM | POA: Diagnosis not present

## 2020-04-05 DIAGNOSIS — I610 Nontraumatic intracerebral hemorrhage in hemisphere, subcortical: Secondary | ICD-10-CM | POA: Diagnosis not present

## 2020-04-05 DIAGNOSIS — I639 Cerebral infarction, unspecified: Secondary | ICD-10-CM | POA: Diagnosis not present

## 2020-04-05 DIAGNOSIS — D45 Polycythemia vera: Secondary | ICD-10-CM | POA: Diagnosis not present

## 2020-04-05 DIAGNOSIS — R7309 Other abnormal glucose: Secondary | ICD-10-CM | POA: Diagnosis not present

## 2020-04-05 DIAGNOSIS — J449 Chronic obstructive pulmonary disease, unspecified: Secondary | ICD-10-CM | POA: Diagnosis not present

## 2020-04-05 DIAGNOSIS — Z23 Encounter for immunization: Secondary | ICD-10-CM | POA: Diagnosis not present

## 2020-04-06 ENCOUNTER — Other Ambulatory Visit: Payer: Self-pay | Admitting: *Deleted

## 2020-04-06 NOTE — Progress Notes (Signed)
[place]  [phone] [phone]     ID: [name] DOB: 1946-10-21  MR#: [id number]  XOV#:[id number]  Patient Care Team: Cari Caraway, MD as PCP - General (Family Medicine) Lorretta Harp, MD as PCP - Cardiology (Cardiology) Chauncey Cruel, MD OTHER MD:  CHIEF COMPLAINT: Polycythemia vera/essential thrombocytosis, HIT, coagulopathy, anemia  CURRENT TREATMENT: Hydroxyurea, rivaroxaban   INTERVAL HISTORY: [name] returns today for follow up of her JAK2 positive proliferative neoplasm, which we initially diagnosed as polycythemia vera but which is behaving more as essential thrombocytosis.  She is accompanied by her husband [name].  She was started on hydroxyurea during her recent hospitalization at 500 mg a day.  She also has extensive thrombosis felt to be related to HIT.  She is on rivaroxaban standard dose for that  She has an IVC filter in place.  The recommendation from interventional radiology given her very extensive IVC clot is to wait until July to reassess and consider removing it at that time   REVIEW OF SYSTEMS: [name] is clearly much improved from baseline.  She is receiving physical therapy and will receive speech therapy as well.  She feels she is "getting ahead of things".  She was using a cane while walking outside and did trip and fall.  She did not injure herself but since that time, about 5 days ago, they have stuck with a wheelchair.  She does not have headaches nausea vomiting cough phlegm production pleurisy or problems with bowel movements except she was taking too much Senokot and that has been cut back.  She has not had bruising hematuria or epistaxis or any other evidence of bleeding.  She is not doing any housework and she is not doing any cooking.  [name] is taking care of that and her 2 sons also help.  They do walk about 300 feet 3 times a day.   HISTORY OF CURRENT ILLNESS: From the original consult  note:  [name] has a history of hypertension followed by Dr. Theadore Nan.  For the last few days she has had headache and she took her blood pressure and it was as high as 168/102 which is unusual for her.  As this was not improving she presented to the emergency room last night 02/13/2020. Dr Roxanne Mins obtained a basic lab work and a head CT.  The head CT without contrast showed an acute intraventricular hemorrhage in the right lateral ventricle, with no parenchymal component.  The lab work showed a white cell count of 12.0, hemoglobin 17.7, and platelets 990,000.  We were consulted for further evaluation and treatment.  The patient's subsequent history is as detailed below.   PAST MEDICAL HISTORY: Past Medical History:  Diagnosis Date  . Hepatitis C   . Hyperlipidemia   . Hypertension   . Osteoporosis     PAST SURGICAL HISTORY: Past Surgical History:  Procedure Laterality Date  . IR ANGIO INTRA EXTRACRAN SEL INTERNAL CAROTID BILAT MOD SED  02/24/2020  . IR ANGIO VERTEBRAL SEL VERTEBRAL UNI R MOD SED  02/24/2020  . IR ANGIOGRAM SELECTIVE EACH ADDITIONAL VESSEL  02/24/2020  . IR IVC FILTER PLMT / S&I /IMG GUID/MOD SED  02/22/2020  . IR THROMBECT VENO MECH MOD SED  02/24/2020  . IR US GUIDE VASC ACCESS RIGHT  02/24/2020  . IR US GUIDE VASC ACCESS RIGHT  02/24/2020  . IR VENO SAGITTAL SINUS  02/24/2020  . IR VENO/JUGULAR RIGHT  02/24/2020  . RADIOLOGY WITH  ANESTHESIA N/A 02/24/2020   Procedure: IR WITH ANESTHESIA;  Surgeon: Radiologist, Medication, MD;  Location: [facility];  Service: Radiology;  Laterality: N/A;    FAMILY HISTORY: Family History  Problem Relation Age of Onset  . Heart disease Mother   . Heart disease Father    The patient's father died at age 95 and the patient's mother at age 20 both from heart disease.  The patient has 1 sister, no brothers.  There is no family history of blood problems and no family history of cancer to the patient's knowledge   GYNECOLOGIC  HISTORY:  No LMP recorded. Patient is postmenopausal. Menarche: 74 years old Age at first live birth: 74 years old [name] P 2 LMP 32 HRT yes, a few years Hysterectomy?  Salpingo-oophorectomy?   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  Her husband [name] ran a business but is now retired.  Their son [name] is a Adult nurse.  Their son Aaron Edelman is an Pharmacologist.  The patient has 1 grandson and 4 step grandchildren.  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    ADVANCED DIRECTIVES: In the absence of any documentation to the contrary, the patient's spouse is their HCPOA.    HEALTH MAINTENANCE: Social History   Tobacco Use  . Smoking status: Former Research scientist (life sciences)  . Smokeless tobacco: Never Used  Substance Use Topics  . Alcohol use: No  . Drug use: No     Colonoscopy: [facility]  PAP: [place]  Bone density:    Allergies  Allergen Reactions  . Heparin Other (See Comments)    4/15 HIT OD 2.553, SRA negative > treated as true HIT   . Amoxicillin-Pot Clavulanate Other (See Comments)  . Erythromycin Itching and Other (See Comments)    Severe stomach pains, diarrhea    Current Outpatient Medications  Medication Sig Dispense Refill  . amLODipine (NORVASC) 10 MG tablet Take 1 tablet (10 mg total) by mouth daily. 30 tablet 0  . atorvastatin (LIPITOR) 10 MG tablet Take 1 tablet (10 mg total) by mouth daily. 30 tablet 0  . fluticasone furoate-vilanterol (BREO ELLIPTA) 200-25 MCG/INH AEPB Inhale 1 puff into the lungs daily. 60 each 0  . furosemide (LASIX) 20 MG tablet Take 1 tablet (20 mg total) by mouth daily. 30 tablet 0  . hydrALAZINE (APRESOLINE) 50 MG tablet Take 1 tablet (50 mg total) by mouth 3 (three) times daily. 90 tablet 0  . hydroxyurea (HYDREA) 500 MG capsule Take 1 capsule (500 mg total) by mouth daily. May take with food to minimize GI side effects. 30 capsule 0  . levothyroxine (SYNTHROID) 125 MCG tablet Take 1 tablet (125 mcg total) by mouth daily  before breakfast. 30 tablet 0  . losartan (COZAAR) 50 MG tablet Take 1 tablet (50 mg total) by mouth 2 (two) times daily. 60 tablet 0  . methylphenidate (RITALIN) 5 MG tablet Take 1 tablet (5 mg total) by mouth 2 (two) times daily with breakfast and lunch. 60 tablet 0  . pantoprazole (PROTONIX) 40 MG tablet Take 1 tablet (40 mg total) by mouth daily. 30 tablet 0  . potassium chloride (KLOR-CON) 20 MEQ packet Take 20 mEq by mouth 2 (two) times daily. 60 each 0  . QUEtiapine (SEROQUEL) 25 MG tablet Take 1 tablet (25 mg total) by mouth at bedtime as needed (for agitation). 15 tablet 0  . rivaroxaban (XARELTO) 20 MG TABS tablet Take 1 tablet (20 mg total) by mouth daily with breakfast. 30  tablet 0  . senna-docusate (SENOKOT-S) 8.6-50 MG tablet Take 2 tablets by mouth 2 (two) times daily. 120 tablet 0  . umeclidinium bromide (INCRUSE ELLIPTA) 62.5 MCG/INH AEPB Inhale 1 puff into the lungs daily. 30 each 0  . valproic acid (DEPAKENE) 250 MG/5ML solution Take 5 mLs (250 mg total) by mouth 2 (two) times daily. 300 mL 0   No current facility-administered medications for this visit.    OBJECTIVE: White woman in no acute distress Vitals:   04/07/20 0923  BP: 111/63  Pulse: (!) 106  Resp: 17  Temp: 98.9 F (37.2 C)  SpO2: 97%     Body mass index is 27.45 kg/m.   Wt Readings from Last 3 Encounters:  04/07/20 150 lb 1.6 oz (68.1 kg)  03/28/20 147 lb 11.3 oz (67 kg)  03/10/20 166 lb 10.7 oz (75.6 kg)      ECOG FS:2 - Symptomatic, <50% confined to bed  Ocular: Sclerae unicteric, pupils round and equal, EOMs intact Ear-nose-throat: Wearing a mask Lymphatic: No cervical or supraclavicular adenopathy Lungs no rales or rhonchi Heart regular rate and rhythm Abd soft, nontender, positive bowel sounds MSK no focal spinal tenderness, no ankle edema Neuro: non-focal, pleasant affect, articulate speech, with some malapropisms (she does not stumble over word finding, just uses the wrong word sometimes,  for example saying brothers when she means sons); not quite able to do heel-to-toe walking Breasts: Deferred   LAB RESULTS:  CMP     Component Value Date/Time   NA 136 03/27/2020 0517   K 4.0 03/27/2020 0517   CL 106 03/27/2020 0517   CO2 21 (L) 03/27/2020 0517   GLUCOSE 115 (H) 03/27/2020 0517   BUN 15 03/27/2020 0517   CREATININE 0.78 03/27/2020 0517   CALCIUM 8.9 03/27/2020 0517   PROT 5.1 (L) 03/14/2020 0530   ALBUMIN 2.8 (L) 03/14/2020 0530   AST 22 03/14/2020 0530   ALT 40 03/14/2020 0530   ALKPHOS 85 03/14/2020 0530   BILITOT 0.4 03/14/2020 0530   GFRNONAA >60 03/27/2020 0517   GFRAA >60 03/27/2020 0517    No results found for: TOTALPROTELP, ALBUMINELP, A1GS, A2GS, BETS, BETA2SER, GAMS, MSPIKE, SPEI  Lab Results  Component Value Date   WBC 5.2 04/07/2020   NEUTROABS 3.5 04/07/2020   HGB 9.7 (L) 04/07/2020   HCT 31.6 (L) 04/07/2020   MCV 105.7 (H) 04/07/2020   PLT 545 (H) 04/07/2020    No results found for: LABCA2  No components found for: [id number]  No results for input(s): INR in the last 168 hours.  No results found for: LABCA2  No results found for: QIO962  No results found for: XBM841  No results found for: LKG401  No results found for: CA2729  No components found for: HGQUANT  No results found for: CEA1 / No results found for: CEA1   No results found for: AFPTUMOR  No results found for: CHROMOGRNA  No results found for: KPAFRELGTCHN, LAMBDASER, KAPLAMBRATIO (kappa/lambda light chains)  No results found for: HGBA, HGBA2QUANT, HGBFQUANT, HGBSQUAN (Hemoglobinopathy evaluation)   Lab Results  Component Value Date   LDH 410 (H) 03/10/2020    Lab Results  Component Value Date   IRON 53 03/09/2020   TIBC 231 (L) 03/09/2020   IRONPCTSAT 23 03/09/2020   (Iron and TIBC)  Lab Results  Component Value Date   FERRITIN 228 03/09/2020    Urinalysis    Component Value Date/Time   COLORURINE YELLOW 03/06/2020 0633   APPEARANCEUR  CLOUDY (  A) 03/06/2020 0633   LABSPEC 1.016 03/06/2020 0633   PHURINE 7.0 03/06/2020 [facility] 03/06/2020 0633   HGBUR NEGATIVE 03/06/2020 0633   BILIRUBINUR NEGATIVE 03/06/2020 0633   KETONESUR NEGATIVE 03/06/2020 0633   PROTEINUR NEGATIVE 03/06/2020 0633   UROBILINOGEN 0.2 08/15/2014 2129   NITRITE NEGATIVE 03/06/2020 0633   LEUKOCYTESUR SMALL (A) 03/06/2020 0633     STUDIES: DG Swallowing Func-Speech Pathology  Result Date: 03/09/2020 Objective Swallowing Evaluation: Type of Study: MBS-Modified Barium Swallow Study  Patient Details Name: MILEENA ROTHENBERGER MRN: [id number] Date of Birth: [birth year] Today's Date: 03/09/2020 Time: SLP Start Time (ACUTE ONLY): 1102 -SLP Stop Time (ACUTE ONLY): 1116 SLP Time Calculation (min) (ACUTE ONLY): 14 min Past Medical History: Past Medical History: Diagnosis Date . Hepatitis C  . Hyperlipidemia  . Hypertension  . Osteoporosis  Past Surgical History: Past Surgical History: Procedure Laterality Date . IR ANGIO INTRA EXTRACRAN SEL INTERNAL CAROTID BILAT MOD SED  02/24/2020 . IR ANGIO VERTEBRAL SEL VERTEBRAL UNI R MOD SED  02/24/2020 . IR ANGIOGRAM SELECTIVE EACH ADDITIONAL VESSEL  02/24/2020 . IR IVC FILTER PLMT / S&I /IMG GUID/MOD SED  02/22/2020 . IR THROMBECT VENO MECH MOD SED  02/24/2020 . IR US GUIDE VASC ACCESS RIGHT  02/24/2020 . IR US GUIDE VASC ACCESS RIGHT  02/24/2020 . IR VENO SAGITTAL SINUS  02/24/2020 . IR VENO/JUGULAR RIGHT  02/24/2020 . RADIOLOGY WITH ANESTHESIA N/A 02/24/2020  Procedure: IR WITH ANESTHESIA;  Surgeon: Radiologist, Medication, MD;  Location: [facility];  Service: Radiology;  Laterality: N/A; HPI: 74 y.o. female with history of HTN presenting with severe persistent HA accompanied by nausea and vomiting. Dx right lateral IVH. Developed acute PE; IVC filter placed 4/13. 4/15 head CT demonstrated deep cerebral thrombosis s/p IR for thrombectomy with expressive aphasia after. 4/16 MRI showed multiple cerebral and cerebellar infarcts, Rt  temporal infarct with cytotoxic edema and hypertonic saline started.  Subjective: alert, restless Assessment / Plan / Recommendation CHL IP CLINICAL IMPRESSIONS 03/09/2020 Clinical Impression Pt's oropharyngeal dysphagia appears to be largely cognitive in nature, although testing was also very limited as she began to refuse all POs after only a few trials. She has mildly slow posterior propulsion with purees, leaving minimal lingual residue on her tongue. Pharyngeally she has no overt weakness, but she did aspirate thin liquids in the setting of premature spillage from her oral cavity before the swallow. This elicited a strong cough response, but visibility below her vocal cords was limited and therefore unable to tell if aspirates were cleared. She had a delayed swallow with nectar thick liquids, suspect in part due to small bolus size but more so because pt was so heavily distracted in that moment. Recommend starting a trial of Dys 1 (puree) diet and nectar thick liquids, with full supervision being important given her mentation and the limited results from this study. Given her repeated pattern of refusing POs, I think it may be challenging to meet her nutritional needs strictly PO until we see more cognitive-linguistic improvements. Will continue to follow. SLP Visit Diagnosis Dysphagia, oropharyngeal phase (R13.12) Attention and concentration deficit following -- Frontal lobe and executive function deficit following -- Impact on safety and function Moderate aspiration risk;Risk for inadequate nutrition/hydration   CHL IP TREATMENT RECOMMENDATION 03/09/2020 Treatment Recommendations Therapy as outlined in treatment plan below   Prognosis 03/09/2020 Prognosis for Safe Diet Advancement Fair Barriers to Reach Goals Cognitive deficits;Severity of deficits Barriers/Prognosis Comment -- CHL IP DIET RECOMMENDATION 03/09/2020 SLP Diet Recommendations  Dysphagia 1 (Puree) solids;Nectar thick liquid Liquid Administration via  Cup;Straw Medication Administration Crushed with puree Compensations Slow rate;Small sips/bites Postural Changes Seated upright at 90 degrees   CHL IP OTHER RECOMMENDATIONS 03/09/2020 Recommended Consults -- Oral Care Recommendations Oral care BID Other Recommendations Order thickener from pharmacy;Prohibited food (jello, ice cream, thin soups);Remove water pitcher;Have oral suction available   CHL IP FOLLOW UP RECOMMENDATIONS 03/09/2020 Follow up Recommendations Inpatient Rehab   CHL IP FREQUENCY AND DURATION 03/09/2020 Speech Therapy Frequency (ACUTE ONLY) min 2x/week Treatment Duration 2 weeks      CHL IP ORAL PHASE 03/09/2020 Oral Phase Impaired Oral - Pudding Teaspoon -- Oral - Pudding Cup -- Oral - Honey Teaspoon -- Oral - Honey Cup -- Oral - Nectar Teaspoon -- Oral - Nectar Cup -- Oral - Nectar Straw Premature spillage Oral - Thin Teaspoon -- Oral - Thin Cup -- Oral - Thin Straw Premature spillage Oral - Puree Premature spillage;Delayed oral transit Oral - Mech Soft -- Oral - Regular -- Oral - Multi-Consistency -- Oral - Pill -- Oral Phase - Comment --  CHL IP PHARYNGEAL PHASE 03/09/2020 Pharyngeal Phase Impaired Pharyngeal- Pudding Teaspoon -- Pharyngeal -- Pharyngeal- Pudding Cup -- Pharyngeal -- Pharyngeal- Honey Teaspoon -- Pharyngeal -- Pharyngeal- Honey Cup -- Pharyngeal -- Pharyngeal- Nectar Teaspoon -- Pharyngeal -- Pharyngeal- Nectar Cup -- Pharyngeal -- Pharyngeal- Nectar Straw Delayed swallow initiation-pyriform sinuses Pharyngeal -- Pharyngeal- Thin Teaspoon -- Pharyngeal -- Pharyngeal- Thin Cup -- Pharyngeal -- Pharyngeal- Thin Straw Penetration/Aspiration before swallow Pharyngeal Material enters airway, passes BELOW cords and not ejected out despite cough attempt by patient;Material enters airway, passes BELOW cords then ejected out Pharyngeal- Puree WFL Pharyngeal -- Pharyngeal- Mechanical Soft -- Pharyngeal -- Pharyngeal- Regular -- Pharyngeal -- Pharyngeal- Multi-consistency -- Pharyngeal --  Pharyngeal- Pill -- Pharyngeal -- Pharyngeal Comment --  CHL IP CERVICAL ESOPHAGEAL PHASE 03/09/2020 Cervical Esophageal Phase WFL Pudding Teaspoon -- Pudding Cup -- Honey Teaspoon -- Honey Cup -- Nectar Teaspoon -- Nectar Cup -- Nectar Straw -- Thin Teaspoon -- Thin Cup -- Thin Straw -- Puree -- Mechanical Soft -- Regular -- Multi-consistency -- Pill -- Cervical Esophageal Comment -- [name]., M.A. [facility] Pager [phone] Office [phone] 03/09/2020, 12:57 PM                ELIGIBLE FOR AVAILABLE RESEARCH PROTOCOL: no  ASSESSMENT: 74 y.o. [place] woman presenting 02/13/2020 with headache, found to have biventricular bleeds in the setting of panmyelosis, subsequently with pulmonary emboli and sinus venous thromboses in the setting of heparin induced thrombocytopenia  (1) myeloproliferative neoplasm: (polycythemia vera?) / essential thrombocytosis             (a) JAK2 mutation positive confirming diagnosis             (b) bone marrow biopsy 02/13/2020 shows no evidence of leukemia, c/w P Vera             (c)  leukapheresis x3 . Last leukapheresis Friday 02/18/2020                         (i) HD catheter removed after pheresis on 02/18/2020             (d) hydrea 500 mg daily to keep HCT <45 and platelets in normal range  (e) Hydrea dose increased to 1000 mg daily Apr 07, 2020              (2) heparin induced thrombocytopenia: positive HIT  screen x2             (a) severe thrombocythemia noted 02/24/2020             (b) bilateral pulmonary emboli 02/22/2020                         (i) s/p IVC filter placement 02/22/2020                         (ii) atrial fibrillation with RVR             (c) venous sinus thromboses 02/24/2020                         (i) s/p mechanical thrombectomy             (d) argatroban started 02/25/2020             (e) platelet count responded to heparin withdrawal             (f) confirmatory serotonin release assay negative  (see discussion below)                         (i) continue argatroban for anticoagulation, no heparin                         (ii) repeat HIT screen at [facility] again strongly positive             (g) platelets normalized as of 03/08/2020  (3) drug-induced thrombocytopenia             (a) discontinued amiodarone, inessential meds                         (i) remains in sinus             (b) resolved  (4) anemia: reticulocytes inappropriately normal; no folate, B-12 or iron deficiency; normal renal function; LDH slightly elevated but negative DAT and normal t bil (no evidence of hemolysis)--consider occult bleeding, medication effect, or marrow dysfunction  PLAN: [name] continues to make progress.  She still has physical therapy ongoing and her husband and children are very assiduous at helping her out.  She has not yet begun to take on household chores such as close washing folding and putting away but I think that is in the future.  Socially she is very pleasant, articulate, and appears very comfortable.  Given the fact that the platelets remain elevated but we actually have anemia, I think the diagnosis is more likely essential thrombocytosis and polycythemia vera.  Both can have the JAK2 mutation.  Her bone marrow showed panmyelosis and in that sense was nonspecific.  A repeat bone marrow biopsy at some point in the future may clench the diagnosis but right now I am calling her problem ET and that is good news for her since essential thrombocytosis almost never debouches in leukemia  I am going to try to get her platelets in the normal range by increasing the Hydrea to 1 g daily.  I am going to check her labs in 2 weeks and then 4 weeks from now and then a month later.  I have asked [name] to palpation and we will discuss results the same day without an appointment.  In mid July she will have repeat clotting  studies and we will consider IVC filter removal  She will then see me in  August.  They know to call for any other issue that may develop before the next visit.  Total encounter time 35 minutes.Victoria Jews C. Leler Brion, MD 04/07/2020 9:29 AM Medical Oncology and Hematology [facility] [place], [place] [address] Tel. [phone]    Fax. [phone]   This document serves as a record of services personally performed by Lurline Del, MD. It was created on his behalf by Wilburn Mylar, a trained medical scribe. The creation of this record is based on the scribe's personal observations and the provider's statements to them.   I, Lurline Del MD, have reviewed the above documentation for accuracy and completeness, and I agree with the above.    *Total Encounter Time as defined by the Centers for Medicare and Medicaid Services includes, in addition to the face-to-face time of a patient visit (documented in the note above) non-face-to-face time: obtaining and reviewing outside history, ordering and reviewing medications, tests or procedures, care coordination (communications with other health care professionals or caregivers) and documentation in the medical record.

## 2020-04-07 ENCOUNTER — Inpatient Hospital Stay: Payer: Medicare HMO

## 2020-04-07 ENCOUNTER — Other Ambulatory Visit: Payer: Self-pay | Admitting: Oncology

## 2020-04-07 ENCOUNTER — Inpatient Hospital Stay: Payer: Medicare HMO | Attending: Oncology | Admitting: Oncology

## 2020-04-07 ENCOUNTER — Other Ambulatory Visit: Payer: Self-pay

## 2020-04-07 VITALS — BP 111/63 | HR 106 | Temp 98.9°F | Resp 17 | Ht 62.0 in | Wt 150.1 lb

## 2020-04-07 DIAGNOSIS — D7582 Heparin induced thrombocytopenia (HIT): Secondary | ICD-10-CM | POA: Diagnosis present

## 2020-04-07 DIAGNOSIS — I8290 Acute embolism and thrombosis of unspecified vein: Secondary | ICD-10-CM | POA: Diagnosis not present

## 2020-04-07 DIAGNOSIS — D473 Essential (hemorrhagic) thrombocythemia: Secondary | ICD-10-CM | POA: Insufficient documentation

## 2020-04-07 DIAGNOSIS — Z95828 Presence of other vascular implants and grafts: Secondary | ICD-10-CM

## 2020-04-07 DIAGNOSIS — Z7901 Long term (current) use of anticoagulants: Secondary | ICD-10-CM | POA: Diagnosis not present

## 2020-04-07 DIAGNOSIS — D649 Anemia, unspecified: Secondary | ICD-10-CM | POA: Diagnosis not present

## 2020-04-07 DIAGNOSIS — I4891 Unspecified atrial fibrillation: Secondary | ICD-10-CM | POA: Diagnosis not present

## 2020-04-07 DIAGNOSIS — Z79899 Other long term (current) drug therapy: Secondary | ICD-10-CM | POA: Diagnosis not present

## 2020-04-07 DIAGNOSIS — I2609 Other pulmonary embolism with acute cor pulmonale: Secondary | ICD-10-CM

## 2020-04-07 DIAGNOSIS — D45 Polycythemia vera: Secondary | ICD-10-CM

## 2020-04-07 DIAGNOSIS — Z86711 Personal history of pulmonary embolism: Secondary | ICD-10-CM | POA: Diagnosis not present

## 2020-04-07 DIAGNOSIS — D75839 Thrombocytosis, unspecified: Secondary | ICD-10-CM

## 2020-04-07 LAB — CBC WITH DIFFERENTIAL/PLATELET
Abs Immature Granulocytes: 0.11 10*3/uL — ABNORMAL HIGH (ref 0.00–0.07)
Basophils Absolute: 0 10*3/uL (ref 0.0–0.1)
Basophils Relative: 1 %
Eosinophils Absolute: 0.1 10*3/uL (ref 0.0–0.5)
Eosinophils Relative: 1 %
HCT: 31.6 % — ABNORMAL LOW (ref 36.0–46.0)
Hemoglobin: 9.7 g/dL — ABNORMAL LOW (ref 12.0–15.0)
Immature Granulocytes: 2 %
Lymphocytes Relative: 13 %
Lymphs Abs: 0.7 10*3/uL (ref 0.7–4.0)
MCH: 32.4 pg (ref 26.0–34.0)
MCHC: 30.7 g/dL (ref 30.0–36.0)
MCV: 105.7 fL — ABNORMAL HIGH (ref 80.0–100.0)
Monocytes Absolute: 0.9 10*3/uL (ref 0.1–1.0)
Monocytes Relative: 16 %
Neutro Abs: 3.5 10*3/uL (ref 1.7–7.7)
Neutrophils Relative %: 67 %
Platelets: 545 10*3/uL — ABNORMAL HIGH (ref 150–400)
RBC: 2.99 MIL/uL — ABNORMAL LOW (ref 3.87–5.11)
RDW: 19 % — ABNORMAL HIGH (ref 11.5–15.5)
WBC: 5.2 10*3/uL (ref 4.0–10.5)
nRBC: 0 % (ref 0.0–0.2)

## 2020-04-07 LAB — COMPREHENSIVE METABOLIC PANEL
ALT: 235 U/L — ABNORMAL HIGH (ref 0–44)
AST: 86 U/L — ABNORMAL HIGH (ref 15–41)
Albumin: 3.1 g/dL — ABNORMAL LOW (ref 3.5–5.0)
Alkaline Phosphatase: 110 U/L (ref 38–126)
Anion gap: 12 (ref 5–15)
BUN: 10 mg/dL (ref 8–23)
CO2: 21 mmol/L — ABNORMAL LOW (ref 22–32)
Calcium: 9.6 mg/dL (ref 8.9–10.3)
Chloride: 105 mmol/L (ref 98–111)
Creatinine, Ser: 0.97 mg/dL (ref 0.44–1.00)
GFR calc Af Amer: >60 mL/min (ref >60)
GFR calc non Af Amer: 58 mL/min — ABNORMAL LOW (ref >60)
Glucose, Bld: 155 mg/dL — ABNORMAL HIGH (ref 70–99)
Potassium: 5.4 mmol/L — ABNORMAL HIGH (ref 3.5–5.1)
Sodium: 138 mmol/L (ref 135–145)
Total Bilirubin: 0.4 mg/dL (ref 0.3–1.2)
Total Protein: 6.5 g/dL (ref 6.5–8.1)

## 2020-04-07 LAB — RETICULOCYTES
Immature Retic Fract: 15.4 % (ref 2.3–15.9)
RBC.: 2.99 MIL/uL — ABNORMAL LOW (ref 3.87–5.11)
Retic Count, Absolute: 65.2 10*3/uL (ref 19.0–186.0)
Retic Ct Pct: 2.2 % (ref 0.4–3.1)

## 2020-04-07 LAB — IRON AND TIBC
Iron: 31 ug/dL — ABNORMAL LOW (ref 41–142)
Saturation Ratios: 11 % — ABNORMAL LOW (ref 21–57)
TIBC: 273 ug/dL (ref 236–444)
UIBC: 241 ug/dL (ref 120–384)

## 2020-04-07 LAB — D-DIMER, QUANTITATIVE: D-Dimer, Quant: 3.14 ug/mL-FEU — ABNORMAL HIGH (ref 0.00–0.50)

## 2020-04-07 LAB — FERRITIN: Ferritin: 207 ng/mL (ref 11–307)

## 2020-04-07 MED ORDER — HYDROXYUREA 500 MG PO CAPS: 1000.0000 mg | ORAL_CAPSULE | Freq: Every day | ORAL | 6 refills | Status: DC

## 2020-04-11 ENCOUNTER — Telehealth: Payer: Self-pay | Admitting: Oncology

## 2020-04-11 DIAGNOSIS — I480 Paroxysmal atrial fibrillation: Secondary | ICD-10-CM | POA: Diagnosis not present

## 2020-04-11 DIAGNOSIS — I10 Essential (primary) hypertension: Secondary | ICD-10-CM | POA: Diagnosis not present

## 2020-04-11 DIAGNOSIS — I4892 Unspecified atrial flutter: Secondary | ICD-10-CM | POA: Diagnosis not present

## 2020-04-11 DIAGNOSIS — I69398 Other sequelae of cerebral infarction: Secondary | ICD-10-CM | POA: Diagnosis not present

## 2020-04-11 DIAGNOSIS — Z7409 Other reduced mobility: Secondary | ICD-10-CM | POA: Diagnosis not present

## 2020-04-11 DIAGNOSIS — R1312 Dysphagia, oropharyngeal phase: Secondary | ICD-10-CM | POA: Diagnosis not present

## 2020-04-11 DIAGNOSIS — I69318 Other symptoms and signs involving cognitive functions following cerebral infarction: Secondary | ICD-10-CM | POA: Diagnosis not present

## 2020-04-11 DIAGNOSIS — D45 Polycythemia vera: Secondary | ICD-10-CM | POA: Diagnosis not present

## 2020-04-11 DIAGNOSIS — R69 Illness, unspecified: Secondary | ICD-10-CM | POA: Diagnosis not present

## 2020-04-11 NOTE — Telephone Encounter (Signed)
Scheduled appts per 5/28 los. Pt's spouse confirmed appt dates and times.

## 2020-04-12 ENCOUNTER — Encounter: Payer: Medicare HMO | Attending: Registered Nurse | Admitting: Registered Nurse

## 2020-04-12 ENCOUNTER — Other Ambulatory Visit: Payer: Self-pay

## 2020-04-12 VITALS — BP 94/63 | HR 106 | Temp 97.5°F | Ht 63.0 in | Wt 149.0 lb

## 2020-04-12 DIAGNOSIS — R1312 Dysphagia, oropharyngeal phase: Secondary | ICD-10-CM | POA: Diagnosis not present

## 2020-04-12 DIAGNOSIS — I69318 Other symptoms and signs involving cognitive functions following cerebral infarction: Secondary | ICD-10-CM | POA: Diagnosis not present

## 2020-04-12 DIAGNOSIS — G08 Intracranial and intraspinal phlebitis and thrombophlebitis: Secondary | ICD-10-CM | POA: Insufficient documentation

## 2020-04-12 DIAGNOSIS — Z95828 Presence of other vascular implants and grafts: Secondary | ICD-10-CM | POA: Diagnosis not present

## 2020-04-12 DIAGNOSIS — I4892 Unspecified atrial flutter: Secondary | ICD-10-CM | POA: Diagnosis not present

## 2020-04-12 DIAGNOSIS — I480 Paroxysmal atrial fibrillation: Secondary | ICD-10-CM | POA: Diagnosis not present

## 2020-04-12 DIAGNOSIS — R609 Edema, unspecified: Secondary | ICD-10-CM | POA: Diagnosis not present

## 2020-04-12 DIAGNOSIS — D45 Polycythemia vera: Secondary | ICD-10-CM | POA: Diagnosis not present

## 2020-04-12 DIAGNOSIS — I10 Essential (primary) hypertension: Secondary | ICD-10-CM | POA: Insufficient documentation

## 2020-04-12 DIAGNOSIS — I610 Nontraumatic intracerebral hemorrhage in hemisphere, subcortical: Secondary | ICD-10-CM | POA: Diagnosis not present

## 2020-04-12 DIAGNOSIS — Z7409 Other reduced mobility: Secondary | ICD-10-CM | POA: Diagnosis not present

## 2020-04-12 DIAGNOSIS — I69398 Other sequelae of cerebral infarction: Secondary | ICD-10-CM | POA: Diagnosis not present

## 2020-04-12 DIAGNOSIS — G441 Vascular headache, not elsewhere classified: Secondary | ICD-10-CM | POA: Insufficient documentation

## 2020-04-12 DIAGNOSIS — R69 Illness, unspecified: Secondary | ICD-10-CM | POA: Diagnosis not present

## 2020-04-12 NOTE — Progress Notes (Signed)
Subjective:    Patient ID: [name], female    DOB: Oct 13, 1946, 74 y.o.   MRN: [id number]  HPI: [name] is a 74 y.o. female who returns for hospital  follow up appointment for Embolic Stroke, Non Traumatic subcortical hemorrhage of right cerebral hemisphere, vascular headache, cerebral venous sinus thrombosis, Essential Hypertension and peripheral edema. [name] was brought to the Emergency room on 02/13/2020 for severe persistent Headache with nausea and vomiting. Neurology , Oncology and Intervention Radiology was consulted.  CT Head WO Contrast:  IMPRESSION: 1. Acute intraventricular hemorrhage within the right lateral ventricle. No definite parenchymal component identified. This is an unusual pattern, but is perhaps less unlikely in the setting of this patient's abnormal coagulation status. 2. Severe chronic ischemic microangiopathy.  CT Head WOI Contrast:  IMPRESSION: No significant change since 8 hours ago. Intraventricular hemorrhage primarily within the right lateral ventricle. No evidence of ongoing bleeding. Small amount of blood dependent within the left lateral ventricle and within the fourth ventricle. Ventricular size is stable. Small amount of subarachnoid blood evident in a left parietal sulcus.  MRI Brain:  IMPRESSION: 1. Stable right intraventricular hemorrhage with extension into the third and fourth ventricle. Stable ventricular size. No evidence of hydrocephalus. 2. Extensive confluent T2 hyperintensity within the white matter of the cerebral hemispheres, basal ganglia, thalami, and pons, nonspecific but may be related to chronic small vessel ischemic changes. 3. Multiple foci of susceptibility artifact throughout the brain parenchyma, progressed from prior MRI. These may be related to microangiopathy versus amyloid angiopathy.  She underwent mechanical Thrombectomy on 02/24/2020 with Intervention Radiology.   [name] was admitted to  Inpatient Rehabilitation on 03/10/2020, and discharged home on 03/29/2020. She is receiving [place] with [place]. She denies any pain. She rated her Pain 3 on Health and History. Also reports her appetite is fair.   Husband in room, all questions answered.       Pain Inventory Average Pain 3 Pain Right Now 3 My pain is intermittent and dull  In the last 24 hours, has pain interfered with the following? General activity 2 Relation with others 0 Enjoyment of life 0 What TIME of day is your pain at its worst? na Sleep (in general) Good  Pain is worse with: walking Pain improves with: na Relief from Meds: na  Mobility walk with assistance use a walker ability to climb steps?  no do you drive?  no  Function retired I need assistance with the following:  dressing, bathing, meal prep, household duties and shopping  Neuro/Psych bladder control problems bowel control problems weakness trouble walking confusion  Prior Studies HFU  Physicians involved in your care HFU   Family History  Problem Relation Age of Onset  . Heart disease Mother   . Heart disease Father    Social History   Socioeconomic History  . Marital status: Married    Spouse name: Not on file  . Number of children: Not on file  . Years of education: Not on file  . Highest education level: Not on file  Occupational History  . Not on file  Tobacco Use  . Smoking status: Former Research scientist (life sciences)  . Smokeless tobacco: Never Used  Substance and Sexual Activity  . Alcohol use: No  . Drug use: No  . Sexual activity: Not on file  Other Topics Concern  . Not on file  Social History Narrative  . Not on file   Social Determinants of Health  Financial Resource Strain:   . Difficulty of Paying Living Expenses:   Food Insecurity:   . Worried About Charity fundraiser in the Last Year:   . Arboriculturist in the Last Year:   Transportation Needs:   . Film/video editor (Medical):   Marland Kitchen  Lack of Transportation (Non-Medical):   Physical Activity:   . Days of Exercise per Week:   . Minutes of Exercise per Session:   Stress:   . Feeling of Stress :   Social Connections:   . Frequency of Communication with Friends and Family:   . Frequency of Social Gatherings with Friends and Family:   . Attends Religious Services:   . Active Member of Clubs or Organizations:   . Attends Archivist Meetings:   Marland Kitchen Marital Status:    Past Surgical History:  Procedure Laterality Date  . IR ANGIO INTRA EXTRACRAN SEL INTERNAL CAROTID BILAT MOD SED  02/24/2020  . IR ANGIO VERTEBRAL SEL VERTEBRAL UNI R MOD SED  02/24/2020  . IR ANGIOGRAM SELECTIVE EACH ADDITIONAL VESSEL  02/24/2020  . IR IVC FILTER PLMT / S&I /IMG GUID/MOD SED  02/22/2020  . IR THROMBECT VENO MECH MOD SED  02/24/2020  . IR US GUIDE VASC ACCESS RIGHT  02/24/2020  . IR US GUIDE VASC ACCESS RIGHT  02/24/2020  . IR VENO SAGITTAL SINUS  02/24/2020  . IR VENO/JUGULAR RIGHT  02/24/2020  . RADIOLOGY WITH ANESTHESIA N/A 02/24/2020   Procedure: IR WITH ANESTHESIA;  Surgeon: Radiologist, Medication, MD;  Location: [facility];  Service: Radiology;  Laterality: N/A;   Past Medical History:  Diagnosis Date  . Hepatitis C   . Hyperlipidemia   . Hypertension   . Osteoporosis    BP 94/63   Pulse (!) 106   Temp (!) 97.5 F (36.4 C)   Ht 5\' 3"  (1.6 m)   Wt 149 lb (67.6 kg)   SpO2 94%   BMI 26.39 kg/m   Opioid Risk Score:   Fall Risk Score:  `1  Depression screen PHQ 2/9  No flowsheet data found.  Review of Systems  Musculoskeletal: Positive for gait problem.  Neurological: Positive for weakness.  Psychiatric/Behavioral: Positive for confusion.       Objective:   Physical Exam Vitals and nursing note reviewed.  Constitutional:      Appearance: Normal appearance. She is obese.  Cardiovascular:     Rate and Rhythm: Normal rate and regular rhythm.     Pulses: Normal pulses.     Heart sounds: Normal heart sounds.    Pulmonary:     Effort: Pulmonary effort is normal.     Breath sounds: Normal breath sounds.  Musculoskeletal:     Cervical back: Normal range of motion and neck supple.     Right lower leg: Edema present.     Left lower leg: Edema present.     Comments: Normal Muscle Bulk and Muscle Testing Reveals:  Upper Extremities: Full ROM and Muscle Strength  RightL 5/5  And Left 4/5 Lower Extremities: Full ROM and Muscle Strength 5/5 Arises from Table Slowly using walker  Gait Steady  Skin:    General: Skin is warm and dry.  Neurological:     Mental Status: She is alert and oriented to person, place, and time.  Psychiatric:        Mood and Affect: Mood normal.        Behavior: Behavior normal.  Assessment & Plan:  1.Embolic Stroke, Non Traumatic subcortical hemorrhage of right cerebral hemisphere. Continue Outpatient Therapy. She has a HFU appointment with Neurology. Continue to Monitor.  2. Vascular headache: Continue current medication regimen. Neurology Following.  3.Cerebral venous sinus thrombosis: She will F/u with Interventional Radiology. Continue to Monitor 4.Essential Hypertension: Continue current Medication Regimen. PCP Following.  5. Peripheral edema: . PCP Following. Continue to Monitor.   20 minutes of face to face patient care time was spent during this visit. All questions were encouraged and answered.

## 2020-04-14 ENCOUNTER — Other Ambulatory Visit: Payer: Self-pay | Admitting: Physical Medicine and Rehabilitation

## 2020-04-14 ENCOUNTER — Encounter: Payer: Self-pay | Admitting: Registered Nurse

## 2020-04-14 DIAGNOSIS — D45 Polycythemia vera: Secondary | ICD-10-CM | POA: Diagnosis not present

## 2020-04-14 DIAGNOSIS — I69398 Other sequelae of cerebral infarction: Secondary | ICD-10-CM | POA: Diagnosis not present

## 2020-04-14 DIAGNOSIS — R69 Illness, unspecified: Secondary | ICD-10-CM | POA: Diagnosis not present

## 2020-04-14 DIAGNOSIS — I10 Essential (primary) hypertension: Secondary | ICD-10-CM | POA: Diagnosis not present

## 2020-04-14 DIAGNOSIS — Z7409 Other reduced mobility: Secondary | ICD-10-CM | POA: Diagnosis not present

## 2020-04-14 DIAGNOSIS — R1312 Dysphagia, oropharyngeal phase: Secondary | ICD-10-CM | POA: Diagnosis not present

## 2020-04-14 DIAGNOSIS — I69318 Other symptoms and signs involving cognitive functions following cerebral infarction: Secondary | ICD-10-CM | POA: Diagnosis not present

## 2020-04-14 DIAGNOSIS — I4892 Unspecified atrial flutter: Secondary | ICD-10-CM | POA: Diagnosis not present

## 2020-04-14 DIAGNOSIS — I480 Paroxysmal atrial fibrillation: Secondary | ICD-10-CM | POA: Diagnosis not present

## 2020-04-17 DIAGNOSIS — I4892 Unspecified atrial flutter: Secondary | ICD-10-CM | POA: Diagnosis not present

## 2020-04-17 DIAGNOSIS — R1312 Dysphagia, oropharyngeal phase: Secondary | ICD-10-CM | POA: Diagnosis not present

## 2020-04-17 DIAGNOSIS — Z7409 Other reduced mobility: Secondary | ICD-10-CM | POA: Diagnosis not present

## 2020-04-17 DIAGNOSIS — R69 Illness, unspecified: Secondary | ICD-10-CM | POA: Diagnosis not present

## 2020-04-17 DIAGNOSIS — I10 Essential (primary) hypertension: Secondary | ICD-10-CM | POA: Diagnosis not present

## 2020-04-17 DIAGNOSIS — I480 Paroxysmal atrial fibrillation: Secondary | ICD-10-CM | POA: Diagnosis not present

## 2020-04-17 DIAGNOSIS — D45 Polycythemia vera: Secondary | ICD-10-CM | POA: Diagnosis not present

## 2020-04-17 DIAGNOSIS — I69318 Other symptoms and signs involving cognitive functions following cerebral infarction: Secondary | ICD-10-CM | POA: Diagnosis not present

## 2020-04-17 DIAGNOSIS — I69398 Other sequelae of cerebral infarction: Secondary | ICD-10-CM | POA: Diagnosis not present

## 2020-04-20 ENCOUNTER — Inpatient Hospital Stay: Payer: Medicare HMO | Attending: Oncology

## 2020-04-20 ENCOUNTER — Other Ambulatory Visit: Payer: Self-pay

## 2020-04-20 ENCOUNTER — Other Ambulatory Visit: Payer: Self-pay | Admitting: Oncology

## 2020-04-20 DIAGNOSIS — Z7409 Other reduced mobility: Secondary | ICD-10-CM | POA: Diagnosis not present

## 2020-04-20 DIAGNOSIS — I69318 Other symptoms and signs involving cognitive functions following cerebral infarction: Secondary | ICD-10-CM | POA: Diagnosis not present

## 2020-04-20 DIAGNOSIS — R69 Illness, unspecified: Secondary | ICD-10-CM | POA: Diagnosis not present

## 2020-04-20 DIAGNOSIS — D7582 Heparin induced thrombocytopenia (HIT): Secondary | ICD-10-CM | POA: Diagnosis not present

## 2020-04-20 DIAGNOSIS — I69398 Other sequelae of cerebral infarction: Secondary | ICD-10-CM | POA: Diagnosis not present

## 2020-04-20 DIAGNOSIS — I480 Paroxysmal atrial fibrillation: Secondary | ICD-10-CM | POA: Diagnosis not present

## 2020-04-20 DIAGNOSIS — D45 Polycythemia vera: Secondary | ICD-10-CM | POA: Insufficient documentation

## 2020-04-20 DIAGNOSIS — I4892 Unspecified atrial flutter: Secondary | ICD-10-CM | POA: Diagnosis not present

## 2020-04-20 DIAGNOSIS — R1312 Dysphagia, oropharyngeal phase: Secondary | ICD-10-CM | POA: Diagnosis not present

## 2020-04-20 DIAGNOSIS — I10 Essential (primary) hypertension: Secondary | ICD-10-CM | POA: Diagnosis not present

## 2020-04-20 LAB — COMPREHENSIVE METABOLIC PANEL
ALT: 38 U/L (ref 0–44)
AST: 25 U/L (ref 15–41)
Albumin: 3.2 g/dL — ABNORMAL LOW (ref 3.5–5.0)
Alkaline Phosphatase: 86 U/L (ref 38–126)
Anion gap: 10 (ref 5–15)
BUN: 14 mg/dL (ref 8–23)
CO2: 21 mmol/L — ABNORMAL LOW (ref 22–32)
Calcium: 10 mg/dL (ref 8.9–10.3)
Chloride: 106 mmol/L (ref 98–111)
Creatinine, Ser: 1.06 mg/dL — ABNORMAL HIGH (ref 0.44–1.00)
GFR calc Af Amer: >60 mL/min (ref >60)
GFR calc non Af Amer: 52 mL/min — ABNORMAL LOW (ref >60)
Glucose, Bld: 174 mg/dL — ABNORMAL HIGH (ref 70–99)
Potassium: 5.3 mmol/L — ABNORMAL HIGH (ref 3.5–5.1)
Sodium: 137 mmol/L (ref 135–145)
Total Bilirubin: 0.3 mg/dL (ref 0.3–1.2)
Total Protein: 7 g/dL (ref 6.5–8.1)

## 2020-04-20 LAB — RETICULOCYTES
Immature Retic Fract: 21 % — ABNORMAL HIGH (ref 2.3–15.9)
RBC.: 3.27 MIL/uL — ABNORMAL LOW (ref 3.87–5.11)
Retic Count, Absolute: 88.3 10*3/uL (ref 19.0–186.0)
Retic Ct Pct: 2.7 % (ref 0.4–3.1)

## 2020-04-20 LAB — CBC WITH DIFFERENTIAL/PLATELET
Abs Immature Granulocytes: 0.11 10*3/uL — ABNORMAL HIGH (ref 0.00–0.07)
Basophils Absolute: 0.1 10*3/uL (ref 0.0–0.1)
Basophils Relative: 1 %
Eosinophils Absolute: 0.1 10*3/uL (ref 0.0–0.5)
Eosinophils Relative: 1 %
HCT: 34.7 % — ABNORMAL LOW (ref 36.0–46.0)
Hemoglobin: 10.7 g/dL — ABNORMAL LOW (ref 12.0–15.0)
Immature Granulocytes: 2 %
Lymphocytes Relative: 19 %
Lymphs Abs: 1.1 10*3/uL (ref 0.7–4.0)
MCH: 33.1 pg (ref 26.0–34.0)
MCHC: 30.8 g/dL (ref 30.0–36.0)
MCV: 107.4 fL — ABNORMAL HIGH (ref 80.0–100.0)
Monocytes Absolute: 0.8 10*3/uL (ref 0.1–1.0)
Monocytes Relative: 13 %
Neutro Abs: 3.8 10*3/uL (ref 1.7–7.7)
Neutrophils Relative %: 64 %
Platelets: 780 10*3/uL — ABNORMAL HIGH (ref 150–400)
RBC: 3.23 MIL/uL — ABNORMAL LOW (ref 3.87–5.11)
RDW: 18.1 % — ABNORMAL HIGH (ref 11.5–15.5)
WBC: 5.9 10*3/uL (ref 4.0–10.5)
nRBC: 0 % (ref 0.0–0.2)

## 2020-04-20 LAB — FERRITIN: Ferritin: 176 ng/mL (ref 11–307)

## 2020-04-20 LAB — IRON AND TIBC
Iron: 36 ug/dL — ABNORMAL LOW (ref 41–142)
Saturation Ratios: 14 % — ABNORMAL LOW (ref 21–57)
TIBC: 265 ug/dL (ref 236–444)
UIBC: 229 ug/dL (ref 120–384)

## 2020-04-20 MED ORDER — HYDROXYUREA 500 MG PO CAPS: 1500.0000 mg | ORAL_CAPSULE | Freq: Every day | ORAL | 6 refills | Status: DC

## 2020-04-20 NOTE — Progress Notes (Signed)
I called [name] and spoke with her husband [name] with [name] presents.  Her platelets are actually up.  I am increasing the Hydrea to 1500 mg daily.  Her white cell count is fine.  Her hemoglobin continues to improve.  Her ferritin is normal.  We will recheck labs in a month.

## 2020-04-24 ENCOUNTER — Other Ambulatory Visit: Payer: Self-pay | Admitting: *Deleted

## 2020-04-24 DIAGNOSIS — R69 Illness, unspecified: Secondary | ICD-10-CM | POA: Diagnosis not present

## 2020-04-24 DIAGNOSIS — R1312 Dysphagia, oropharyngeal phase: Secondary | ICD-10-CM | POA: Diagnosis not present

## 2020-04-24 DIAGNOSIS — I69398 Other sequelae of cerebral infarction: Secondary | ICD-10-CM | POA: Diagnosis not present

## 2020-04-24 DIAGNOSIS — I480 Paroxysmal atrial fibrillation: Secondary | ICD-10-CM | POA: Diagnosis not present

## 2020-04-24 DIAGNOSIS — D45 Polycythemia vera: Secondary | ICD-10-CM | POA: Diagnosis not present

## 2020-04-24 DIAGNOSIS — I10 Essential (primary) hypertension: Secondary | ICD-10-CM | POA: Diagnosis not present

## 2020-04-24 DIAGNOSIS — I4892 Unspecified atrial flutter: Secondary | ICD-10-CM | POA: Diagnosis not present

## 2020-04-24 DIAGNOSIS — Z7409 Other reduced mobility: Secondary | ICD-10-CM | POA: Diagnosis not present

## 2020-04-24 DIAGNOSIS — I69318 Other symptoms and signs involving cognitive functions following cerebral infarction: Secondary | ICD-10-CM | POA: Diagnosis not present

## 2020-04-24 MED ORDER — HYDROXYUREA 500 MG PO CAPS: 1500.0000 mg | ORAL_CAPSULE | Freq: Every day | ORAL | 6 refills | Status: DC

## 2020-04-26 DIAGNOSIS — D45 Polycythemia vera: Secondary | ICD-10-CM | POA: Diagnosis not present

## 2020-04-26 DIAGNOSIS — I69318 Other symptoms and signs involving cognitive functions following cerebral infarction: Secondary | ICD-10-CM | POA: Diagnosis not present

## 2020-04-26 DIAGNOSIS — I69398 Other sequelae of cerebral infarction: Secondary | ICD-10-CM | POA: Diagnosis not present

## 2020-04-26 DIAGNOSIS — I10 Essential (primary) hypertension: Secondary | ICD-10-CM | POA: Diagnosis not present

## 2020-04-26 DIAGNOSIS — R69 Illness, unspecified: Secondary | ICD-10-CM | POA: Diagnosis not present

## 2020-04-26 DIAGNOSIS — I480 Paroxysmal atrial fibrillation: Secondary | ICD-10-CM | POA: Diagnosis not present

## 2020-04-26 DIAGNOSIS — R1312 Dysphagia, oropharyngeal phase: Secondary | ICD-10-CM | POA: Diagnosis not present

## 2020-04-26 DIAGNOSIS — I4892 Unspecified atrial flutter: Secondary | ICD-10-CM | POA: Diagnosis not present

## 2020-04-26 DIAGNOSIS — Z7409 Other reduced mobility: Secondary | ICD-10-CM | POA: Diagnosis not present

## 2020-05-01 DIAGNOSIS — D45 Polycythemia vera: Secondary | ICD-10-CM | POA: Diagnosis not present

## 2020-05-01 DIAGNOSIS — Z7409 Other reduced mobility: Secondary | ICD-10-CM | POA: Diagnosis not present

## 2020-05-01 DIAGNOSIS — I4892 Unspecified atrial flutter: Secondary | ICD-10-CM | POA: Diagnosis not present

## 2020-05-01 DIAGNOSIS — R69 Illness, unspecified: Secondary | ICD-10-CM | POA: Diagnosis not present

## 2020-05-01 DIAGNOSIS — I69318 Other symptoms and signs involving cognitive functions following cerebral infarction: Secondary | ICD-10-CM | POA: Diagnosis not present

## 2020-05-01 DIAGNOSIS — I480 Paroxysmal atrial fibrillation: Secondary | ICD-10-CM | POA: Diagnosis not present

## 2020-05-01 DIAGNOSIS — I10 Essential (primary) hypertension: Secondary | ICD-10-CM | POA: Diagnosis not present

## 2020-05-01 DIAGNOSIS — I69398 Other sequelae of cerebral infarction: Secondary | ICD-10-CM | POA: Diagnosis not present

## 2020-05-01 DIAGNOSIS — R1312 Dysphagia, oropharyngeal phase: Secondary | ICD-10-CM | POA: Diagnosis not present

## 2020-05-04 ENCOUNTER — Other Ambulatory Visit: Payer: Medicare HMO

## 2020-05-04 DIAGNOSIS — R41840 Attention and concentration deficit: Secondary | ICD-10-CM | POA: Diagnosis not present

## 2020-05-04 DIAGNOSIS — R634 Abnormal weight loss: Secondary | ICD-10-CM | POA: Diagnosis not present

## 2020-05-04 DIAGNOSIS — D45 Polycythemia vera: Secondary | ICD-10-CM | POA: Diagnosis not present

## 2020-05-04 DIAGNOSIS — I639 Cerebral infarction, unspecified: Secondary | ICD-10-CM | POA: Diagnosis not present

## 2020-05-04 DIAGNOSIS — I10 Essential (primary) hypertension: Secondary | ICD-10-CM | POA: Diagnosis not present

## 2020-05-04 DIAGNOSIS — J449 Chronic obstructive pulmonary disease, unspecified: Secondary | ICD-10-CM | POA: Diagnosis not present

## 2020-05-04 DIAGNOSIS — Z95828 Presence of other vascular implants and grafts: Secondary | ICD-10-CM | POA: Diagnosis not present

## 2020-05-08 ENCOUNTER — Other Ambulatory Visit: Payer: Self-pay

## 2020-05-08 DIAGNOSIS — I69398 Other sequelae of cerebral infarction: Secondary | ICD-10-CM | POA: Diagnosis not present

## 2020-05-08 DIAGNOSIS — Z95828 Presence of other vascular implants and grafts: Secondary | ICD-10-CM

## 2020-05-08 DIAGNOSIS — D45 Polycythemia vera: Secondary | ICD-10-CM | POA: Diagnosis not present

## 2020-05-08 DIAGNOSIS — I4892 Unspecified atrial flutter: Secondary | ICD-10-CM | POA: Diagnosis not present

## 2020-05-08 DIAGNOSIS — I10 Essential (primary) hypertension: Secondary | ICD-10-CM | POA: Diagnosis not present

## 2020-05-08 DIAGNOSIS — I480 Paroxysmal atrial fibrillation: Secondary | ICD-10-CM | POA: Diagnosis not present

## 2020-05-08 DIAGNOSIS — R1312 Dysphagia, oropharyngeal phase: Secondary | ICD-10-CM | POA: Diagnosis not present

## 2020-05-08 DIAGNOSIS — I69318 Other symptoms and signs involving cognitive functions following cerebral infarction: Secondary | ICD-10-CM | POA: Diagnosis not present

## 2020-05-08 DIAGNOSIS — Z7409 Other reduced mobility: Secondary | ICD-10-CM | POA: Diagnosis not present

## 2020-05-08 DIAGNOSIS — R69 Illness, unspecified: Secondary | ICD-10-CM | POA: Diagnosis not present

## 2020-05-10 ENCOUNTER — Encounter: Payer: Self-pay | Admitting: Neurology

## 2020-05-10 ENCOUNTER — Ambulatory Visit: Payer: Medicare HMO | Admitting: Neurology

## 2020-05-10 VITALS — BP 91/62 | HR 90 | Ht 62.0 in | Wt 142.2 lb

## 2020-05-10 DIAGNOSIS — G3184 Mild cognitive impairment, so stated: Secondary | ICD-10-CM

## 2020-05-10 DIAGNOSIS — R413 Other amnesia: Secondary | ICD-10-CM | POA: Diagnosis not present

## 2020-05-10 DIAGNOSIS — D649 Anemia, unspecified: Secondary | ICD-10-CM | POA: Diagnosis not present

## 2020-05-10 DIAGNOSIS — M1812 Unilateral primary osteoarthritis of first carpometacarpal joint, left hand: Secondary | ICD-10-CM | POA: Diagnosis not present

## 2020-05-10 DIAGNOSIS — J449 Chronic obstructive pulmonary disease, unspecified: Secondary | ICD-10-CM | POA: Diagnosis not present

## 2020-05-10 DIAGNOSIS — E039 Hypothyroidism, unspecified: Secondary | ICD-10-CM | POA: Diagnosis not present

## 2020-05-10 DIAGNOSIS — I10 Essential (primary) hypertension: Secondary | ICD-10-CM | POA: Diagnosis not present

## 2020-05-10 DIAGNOSIS — G08 Intracranial and intraspinal phlebitis and thrombophlebitis: Secondary | ICD-10-CM | POA: Diagnosis not present

## 2020-05-10 DIAGNOSIS — M810 Age-related osteoporosis without current pathological fracture: Secondary | ICD-10-CM | POA: Diagnosis not present

## 2020-05-10 DIAGNOSIS — I610 Nontraumatic intracerebral hemorrhage in hemisphere, subcortical: Secondary | ICD-10-CM | POA: Diagnosis not present

## 2020-05-10 DIAGNOSIS — E782 Mixed hyperlipidemia: Secondary | ICD-10-CM | POA: Diagnosis not present

## 2020-05-10 DIAGNOSIS — I639 Cerebral infarction, unspecified: Secondary | ICD-10-CM | POA: Diagnosis not present

## 2020-05-10 NOTE — Patient Instructions (Signed)
I had a long discussion with the patient and her husband regarding her recent hospitalization for cerebral venous sinus thrombosis secondary to polycythemia and her prolonged hospital stay and she is relative doing quite well still has mild residual speech and language and cognitive difficulties but I expect these to improve with time.  Recommend she discontinue Depakote and she is not having significant headaches anymore and Lasix and potassium.  She does have relatively low blood pressure and I recommend she follow-up with the primary care physician to taper and possibly discontinue her hydralazine on Norvasc also if needed.  I recommend she stay on Xarelto for at least 9 months and we will repeat CT venogram prior to deciding on discontinuation.  I recommend she increase participation in cognitively challenging activities like solving crossword puzzles, playing bridge and sodoku.  We also discussed memory compensation strategies.  If cognitive difficulties persist upon follow-up visit in 3 months may consider trial of Aricept at that visit. Memory Compensation Strategies  1. Use "WARM" strategy.  W= write it down  A= associate it  R= repeat it  M= make a mental note  2.   You can keep a Social worker.  Use a 3-ring notebook with sections for the following: calendar, important names and phone numbers,  medications, doctors' names/phone numbers, lists/reminders, and a section to journal what you did  each day.   3.    Use a calendar to write appointments down.  4.    Write yourself a schedule for the day.  This can be placed on the calendar or in a separate section of the Memory Notebook.  Keeping a  regular schedule can help memory.  5.    Use medication organizer with sections for each day or morning/evening pills.  You may need help loading it  6.    Keep a basket, or pegboard by the door.  Place items that you need to take out with you in the basket or on the pegboard.  You may also want  to  include a message board for reminders.  7.    Use sticky notes.  Place sticky notes with reminders in a place where the task is performed.  For example: " turn off the  stove" placed by the stove, "lock the door" placed on the door at eye level, " take your medications" on  the bathroom mirror or by the place where you normally take your medications.  8.    Use alarms/timers.  Use while cooking to remind yourself to check on food or as a reminder to take your medicine, or as a  reminder to make a call, or as a reminder to perform another task, etc.

## 2020-05-10 NOTE — Progress Notes (Signed)
[facility] [address] [place]. [place] [address] [phone]       OFFICE FOLLOW-UP NOTE  [name] Date of Birth:  1946/07/26 Medical Record Number:  [id number]   HPI: [name] is a 74 year old pleasant Caucasian lady seen today for initial office follow-up visit following hospital admission for intracerebral hemorrhage in April 2021.  She is accompanied by her husband.  History is obtained from them and review of electronic medical records and I personally reviewed imaging films in PACS. She is a 74 year old lady with past medical history of hypertension who presented to [facility] on 02/13/2020 with sudden onset of severe headache.  She was sitting in front of the computer and noticed that headache on the top of her head which is quite severe and persisted without relief.  She came to the emergency room where routine blood work revealed evidence of thrombocytosis and CT scan showed intraventricular hemorrhage.  This involves mostly the right lateral ventricle and there was changes of chronic small vessel disease.  She was kept in the intensive care unit where blood pressure was tightly controlled and follow-up CT scan showed stable appearance of the hemorrhage.  MRI scan was obtained which also showed stable intraventricular hemorrhage and severe changes of small vessel disease and multiple foci of microhemorrhages raising possibility of amyloid angiopathy versus hypertensive microangiopathy.  MRA of the brain showed no large vessel stenosis or occlusion.  2D echo showed normal ejection fraction.  LDL cholesterol 92 mg percent.  Hemoglobin A1c was 6.1.  Initially the etiology of the hemorrhage was thought to be hypertensive but subsequently patient's showed neurological worsening and she developed new left temporal occipital hemorrhage.  MR venogram on 02/25/2020 raise possibility of deep cerebral venous thrombosis.  Patient became obtunded and less  responsive.  She started hypertonic saline note back to the ICU however she did not get better.  Subsequently a cerebral catheter angiogram was done 03/01/2020 by Dr. Manson Passey and after consent she did mechanical thrombectomy with recanalization of the vein of Galen and straight sinus with decreased clot burden in the torcula right transverse and sigmoid sinuses.  A residual nonocclusive clot was seen in the vein of Galen.  Patient had presented with thrombocytosis and she was diagnosed with polycythemia and followed by the hematology team and she underwent a pheresis.  She is started on hydroxyurea subsequently she developed thrombocytopenia and lab work suggested heparin-induced thrombocytopenia.  She was initially started on IV heparin for her venous sinus thrombosis but this was subsequently discontinued and she was started on IV argatroban which was continued till she was transferred to rehab and was able to swallow then she was switched to Pradaxa.  Patient spent 3 weeks in rehab and is gradually improved.  His speech and language are improved.  She is living at home with his husband.  She still has some word finding difficulties and occasional paraphasic errors but she is still getting ongoing speech therapy which is helpful.  She has no physical weakness and gait or balance problems.  Her platelet count is been followed regularly by Dr. Jana Hakim hematologist and she has recently been started back on hydroxyurea and her diagnosis is no change to essential thrombocytosis rather than polycythemia vera.  Patient's blood pressure has been running quite low with systolic in the 42P and 53I recently.  She is on Lasix, hydralazine, Norvasc.  She is having cognitive and memory issues but otherwise is doing fine. ROS:  14 system review of systems is positive for memory loss, speech difficulties, headache, confusion and all other systems negative  PMH:  Past Medical History:  Diagnosis Date  .  Hepatitis C   . Hyperlipidemia   . Hypertension   . Osteoporosis   . Stroke [facility])     Social History:  Social History   Socioeconomic History  . Marital status: Married    Spouse name: Not on file  . Number of children: Not on file  . Years of education: Not on file  . Highest education level: Not on file  Occupational History  . Not on file  Tobacco Use  . Smoking status: Former Research scientist (life sciences)  . Smokeless tobacco: Never Used  Substance and Sexual Activity  . Alcohol use: No  . Drug use: No  . Sexual activity: Not on file  Other Topics Concern  . Not on file  Social History Narrative  . Not on file   Social Determinants of Health   Financial Resource Strain:   . Difficulty of Paying Living Expenses:   Food Insecurity:   . Worried About Charity fundraiser in the Last Year:   . Arboriculturist in the Last Year:   Transportation Needs:   . Film/video editor (Medical):   Marland Kitchen Lack of Transportation (Non-Medical):   Physical Activity:   . Days of Exercise per Week:   . Minutes of Exercise per Session:   Stress:   . Feeling of Stress :   Social Connections:   . Frequency of Communication with Friends and Family:   . Frequency of Social Gatherings with Friends and Family:   . Attends Religious Services:   . Active Member of Clubs or Organizations:   . Attends Archivist Meetings:   Marland Kitchen Marital Status:   Intimate Partner Violence:   . Fear of Current or Ex-Partner:   . Emotionally Abused:   Marland Kitchen Physically Abused:   . Sexually Abused:     Medications:   Current Outpatient Medications on File Prior to Visit  Medication Sig Dispense Refill  . amLODipine (NORVASC) 10 MG tablet Take 1 tablet (10 mg total) by mouth daily. 30 tablet 0  . atorvastatin (LIPITOR) 10 MG tablet Take 1 tablet (10 mg total) by mouth daily. 30 tablet 0  . atorvastatin (LIPITOR) 20 MG tablet Take 20 mg by mouth at bedtime.    . fluticasone furoate-vilanterol (BREO ELLIPTA) 200-25 MCG/INH  AEPB Inhale 1 puff into the lungs daily. 60 each 0  . hydrALAZINE (APRESOLINE) 50 MG tablet Take 1 tablet (50 mg total) by mouth 3 (three) times daily. (Patient taking differently: Take 25 mg by mouth 3 (three) times daily. ) 90 tablet 0  . hydroxyurea (HYDREA) 500 MG capsule Take 3 capsules (1,500 mg total) by mouth daily. May take with food to minimize GI side effects. 120 capsule 6  . levothyroxine (SYNTHROID) 125 MCG tablet Take 1 tablet (125 mcg total) by mouth daily before breakfast. 30 tablet 0  . losartan (COZAAR) 50 MG tablet Take 1 tablet (50 mg total) by mouth 2 (two) times daily. 60 tablet 0  . pantoprazole (PROTONIX) 40 MG tablet Take 1 tablet (40 mg total) by mouth daily. 30 tablet 0  . QUEtiapine (SEROQUEL) 25 MG tablet Take 1 tablet (25 mg total) by mouth at bedtime as needed (for agitation). 15 tablet 0  . rivaroxaban (XARELTO) 20 MG TABS tablet Take 1 tablet (20 mg total) by mouth daily with  breakfast. 30 tablet 0  . senna-docusate (SENOKOT-S) 8.6-50 MG tablet Take 2 tablets by mouth 2 (two) times daily. (Patient taking differently: Take 1 tablet by mouth 2 (two) times daily. ) 120 tablet 0  . umeclidinium bromide (INCRUSE ELLIPTA) 62.5 MCG/INH AEPB Inhale 1 puff into the lungs daily. 30 each 0   No current facility-administered medications on file prior to visit.    Allergies:   Allergies  Allergen Reactions  . Heparin Other (See Comments)    4/15 HIT OD 2.553, SRA negative > treated as true HIT   . Amoxicillin-Pot Clavulanate Other (See Comments)  . Erythromycin Itching and Other (See Comments)    Severe stomach pains, diarrhea    Physical Exam General: well developed, well nourished,\elderly Caucasian lady seated, in no evident distress Head: head normocephalic and atraumatic.  Neck: supple with no carotid or supraclavicular bruits Cardiovascular: regular rate and rhythm, no murmurs Musculoskeletal: no deformity Skin:  no rash/petichiae Vascular:  Normal pulses all  extremities Vitals:   05/10/20 1343  BP: 91/62  Pulse: 90   Neurologic Exam Mental Status: Awake and fully alert. Oriented to place and time. Recent and remote memory poor. Attention span, concentration and fund of knowledge diminished. Mood and affect appropriate.  Cranial Nerves: Fundoscopic exam reveals sharp disc margins. Pupils equal, briskly reactive to light. Extraocular movements full without nystagmus. Visual fields full to confrontation. Hearing intact. Facial sensation intact. Face, tongue, palate moves normally and symmetrically.  Motor: Normal bulk and tone. Normal strength in all tested extremity muscles. Sensory.: intact to touch ,pinprick .position and vibratory sensation.  Coordination: Rapid alternating movements normal in all extremities. Finger-to-nose and heel-to-shin performed accurately bilaterally. Gait and Station: Arises from chair without difficulty. Stance is normal. Gait demonstrates normal stride length and balance . Able to heel, toe and tandem walk with mild difficulty.  Reflexes: 1+ and symmetric. Toes downgoing.   NIHSS  2 Modified Rankin  3   ASSESSMENT: 74 year old Caucasian lady with intraventricular hemorrhage due to deep cerebral venous sinus thrombosis likely related to polycythemia treated with mechanical thrombectomy and prolonged hospital course complicated by bilateral pulmonary embolism, heparin-induced thrombocytopenia and anemia.  Patient is doing relatively well now but has residual mild aphasia and cognitive impairment.     PLAN: I had a long discussion with the patient and her husband regarding her recent hospitalization for cerebral venous sinus thrombosis secondary to polycythemia and her prolonged hospital stay and she is relative doing quite well still has mild residual speech and language and cognitive difficulties but I expect these to improve with time.  Recommend she discontinue Depakote and she is not having significant headaches  anymore and Lasix and potassium.  She does have relatively low blood pressure and I recommend she follow-up with the primary care physician to taper and possibly discontinue her hydralazine on Norvasc also if needed.  I recommend she stay on Xarelto for at least 9 months and we will repeat CT venogram prior to deciding on discontinuation.  I recommend she increase participation in cognitively challenging activities like solving crossword puzzles, playing bridge and sodoku.  We also discussed memory compensation strategies.  If cognitive difficulties persist upon follow-up visit in 3 months may consider trial of Aricept at that visit. Greater than 50% of time during this prolonged 45 minute visit was spent on counseling,explanation of diagnosis of , planning of further management, discussion with patient and family and coordination of care Victoria Contras, MD  [facility] [address]  [place], [place] [address]  Phone [phone] Fax [phone] Note: This document was prepared with digital dictation and possible smart phrase technology. Any transcriptional errors that result from this process are unintentional

## 2020-05-12 ENCOUNTER — Encounter: Payer: Self-pay | Admitting: Physical Medicine & Rehabilitation

## 2020-05-12 ENCOUNTER — Encounter: Payer: Medicare HMO | Attending: Physical Medicine & Rehabilitation | Admitting: Physical Medicine & Rehabilitation

## 2020-05-12 ENCOUNTER — Other Ambulatory Visit: Payer: Self-pay

## 2020-05-12 VITALS — BP 86/58 | HR 91 | Temp 97.9°F | Ht 63.0 in | Wt 141.0 lb

## 2020-05-12 DIAGNOSIS — I610 Nontraumatic intracerebral hemorrhage in hemisphere, subcortical: Secondary | ICD-10-CM | POA: Diagnosis not present

## 2020-05-12 DIAGNOSIS — I10 Essential (primary) hypertension: Secondary | ICD-10-CM | POA: Diagnosis not present

## 2020-05-12 DIAGNOSIS — G08 Intracranial and intraspinal phlebitis and thrombophlebitis: Secondary | ICD-10-CM | POA: Diagnosis not present

## 2020-05-12 DIAGNOSIS — R609 Edema, unspecified: Secondary | ICD-10-CM | POA: Insufficient documentation

## 2020-05-12 DIAGNOSIS — Z95828 Presence of other vascular implants and grafts: Secondary | ICD-10-CM | POA: Diagnosis not present

## 2020-05-12 DIAGNOSIS — G441 Vascular headache, not elsewhere classified: Secondary | ICD-10-CM | POA: Insufficient documentation

## 2020-05-12 NOTE — Patient Instructions (Signed)
No driving until next visit

## 2020-05-12 NOTE — Progress Notes (Signed)
Subjective:    Patient ID: [name], female    DOB: 1946-06-07, 74 y.o.   MRN: [id number]  HPI   Left temporo occipital ICH 02/26/2020 Has aphasia, difficulty with word finding Husband notes issues with maintaining concentration  HHPT,HHOT finished HHSLP 3 more visit  Mod I dressing and bathing Ambulates without assistive device in home, uses a cane in the community.  Pain Inventory Average Pain no pain Pain Right Now no pain My pain is backache occasionally  In the last 24 hours, has pain interfered with the following? General activity 0 Relation with others 0 Enjoyment of life 0 What TIME of day is your pain at its worst? no pain Sleep (in general) Good  Pain is worse with: no pain Pain improves with: no pain Relief from Meds: no pain  Mobility use a cane ability to climb steps?  yes do you drive?  no  Function I need assistance with the following:  bathing, meal prep, household duties and shopping  Neuro/Psych bladder control problems weakness trouble walking confusion  Prior Studies Any changes since last visit?  no  Physicians involved in your care Any changes since last visit?  no   Family History  Problem Relation Age of Onset   Heart disease Mother    Heart disease Father    Social History   Socioeconomic History   Marital status: Married    Spouse name: Not on file   Number of children: Not on file   Years of education: Not on file   Highest education level: Not on file  Occupational History   Not on file  Tobacco Use   Smoking status: Former Smoker   Smokeless tobacco: Never Used  Substance and Sexual Activity   Alcohol use: No   Drug use: No   Sexual activity: Not on file  Other Topics Concern   Not on file  Social History Narrative   Not on file   Social Determinants of Health   Financial Resource Strain:    Difficulty of Paying Living Expenses:   Food Insecurity:    Worried About Sales executive in the Last Year:    Arboriculturist in the Last Year:   Transportation Needs:    Film/video editor (Medical):    Lack of Transportation (Non-Medical):   Physical Activity:    Days of Exercise per Week:    Minutes of Exercise per Session:   Stress:    Feeling of Stress :   Social Connections:    Frequency of Communication with Friends and Family:    Frequency of Social Gatherings with Friends and Family:    Attends Religious Services:    Active Member of Clubs or Organizations:    Attends Archivist Meetings:    Marital Status:    Past Surgical History:  Procedure Laterality Date   IR ANGIO INTRA EXTRACRAN SEL INTERNAL CAROTID BILAT MOD SED  02/24/2020   IR ANGIO VERTEBRAL SEL VERTEBRAL UNI R MOD SED  02/24/2020   IR ANGIOGRAM SELECTIVE EACH ADDITIONAL VESSEL  02/24/2020   IR IVC FILTER PLMT / S&I /IMG GUID/MOD SED  02/22/2020   IR THROMBECT VENO MECH MOD SED  02/24/2020   IR US GUIDE VASC ACCESS RIGHT  02/24/2020   IR US GUIDE VASC ACCESS RIGHT  02/24/2020   IR VENO SAGITTAL SINUS  02/24/2020   IR VENO/JUGULAR RIGHT  02/24/2020   RADIOLOGY WITH ANESTHESIA N/A 02/24/2020   Procedure: IR  WITH ANESTHESIA;  Surgeon: Radiologist, Medication, MD;  Location: [facility];  Service: Radiology;  Laterality: N/A;   Past Medical History:  Diagnosis Date   Hepatitis C    Hyperlipidemia    Hypertension    Osteoporosis    Stroke ([place])    BP (!) 86/58    Pulse 91    Temp 97.9 F (36.6 C)    Ht 5\' 3"  (1.6 m)    Wt 141 lb (64 kg)    SpO2 96%    BMI 24.98 kg/m   Opioid Risk Score:   Fall Risk Score:  `1  Depression screen PHQ 2/9  Depression screen [facility] 2/9 05/12/2020 04/12/2020  Decreased Interest 0 0  Down, Depressed, Hopeless 0 0  PHQ - 2 Score 0 0  Altered sleeping - 0  Tired, decreased energy - 0  Change in appetite - 3  Feeling bad or failure about yourself  - 0  Trouble concentrating - 0  Moving slowly or fidgety/restless - 3  Suicidal  thoughts - 0  PHQ-9 Score - 6  Difficult doing work/chores - Somewhat difficult    Review of Systems  Constitutional: Positive for appetite change and unexpected weight change.       Poor  HENT: Negative.   Eyes: Negative.   Respiratory: Negative.   Cardiovascular: Negative.        Bp has been low, PCP evaluating  Gastrointestinal: Negative.   Endocrine: Negative.   Genitourinary:       Bladder control  Musculoskeletal: Positive for gait problem.  Skin: Negative.   Allergic/Immunologic: Negative.   Hematological: Bruises/bleeds easily.       Xarelto  Psychiatric/Behavioral: Negative.   All other systems reviewed and are negative.      Objective:   Physical Exam Vitals and nursing note reviewed.  Constitutional:      Appearance: She is normal weight.  HENT:     Head: Normocephalic and atraumatic.  Eyes:     Extraocular Movements: Extraocular movements intact.     Conjunctiva/sclera: Conjunctivae normal.     Pupils: Pupils are equal, round, and reactive to light.  Neurological:     General: No focal deficit present.     Mental Status: She is alert and oriented to person, place, and time.     Comments: Oriented to person, place not time   Psychiatric:        Mood and Affect: Mood normal.        Behavior: Behavior normal.    Motor strength is 5/5 bilateral deltoid bicep tricep grip she has decreased fine motor right upper extremity with finger thumb opposition but this is mild Motor strength right lower extremity 4+ hip flexor knee extensor 5 with ankle dorsiflexor 5/5 left hip flexor knee extensor ankle dorsiflexor Speech without evidence of dysarthria she has hesitancy when speaking.  Comprehension is good       Assessment & Plan:  #1.  Left temporal occipital infarct no L visual field cut.  She does have expressive language deficits and may have some receptive deficits mainly with reading. Recommend continue speech therapy in the outpatient setting We will  continue PT for higher-level balance on uneven surfaces, neuro rehab outpatient Return to clinic in 2 months

## 2020-05-16 DIAGNOSIS — R69 Illness, unspecified: Secondary | ICD-10-CM | POA: Diagnosis not present

## 2020-05-16 DIAGNOSIS — I69318 Other symptoms and signs involving cognitive functions following cerebral infarction: Secondary | ICD-10-CM | POA: Diagnosis not present

## 2020-05-16 DIAGNOSIS — I69398 Other sequelae of cerebral infarction: Secondary | ICD-10-CM | POA: Diagnosis not present

## 2020-05-16 DIAGNOSIS — I4892 Unspecified atrial flutter: Secondary | ICD-10-CM | POA: Diagnosis not present

## 2020-05-16 DIAGNOSIS — I480 Paroxysmal atrial fibrillation: Secondary | ICD-10-CM | POA: Diagnosis not present

## 2020-05-16 DIAGNOSIS — R1312 Dysphagia, oropharyngeal phase: Secondary | ICD-10-CM | POA: Diagnosis not present

## 2020-05-16 DIAGNOSIS — D45 Polycythemia vera: Secondary | ICD-10-CM | POA: Diagnosis not present

## 2020-05-16 DIAGNOSIS — Z7409 Other reduced mobility: Secondary | ICD-10-CM | POA: Diagnosis not present

## 2020-05-16 DIAGNOSIS — I10 Essential (primary) hypertension: Secondary | ICD-10-CM | POA: Diagnosis not present

## 2020-05-18 ENCOUNTER — Inpatient Hospital Stay: Payer: Medicare HMO | Attending: Oncology

## 2020-05-18 ENCOUNTER — Other Ambulatory Visit: Payer: Self-pay

## 2020-05-18 ENCOUNTER — Telehealth: Payer: Self-pay | Admitting: *Deleted

## 2020-05-18 DIAGNOSIS — D45 Polycythemia vera: Secondary | ICD-10-CM

## 2020-05-18 LAB — COMPREHENSIVE METABOLIC PANEL
ALT: 25 U/L (ref 0–44)
AST: 19 U/L (ref 15–41)
Albumin: 3.8 g/dL (ref 3.5–5.0)
Alkaline Phosphatase: 52 U/L (ref 38–126)
Anion gap: 12 (ref 5–15)
BUN: 20 mg/dL (ref 8–23)
CO2: 21 mmol/L — ABNORMAL LOW (ref 22–32)
Calcium: 10.1 mg/dL (ref 8.9–10.3)
Chloride: 109 mmol/L (ref 98–111)
Creatinine, Ser: 0.98 mg/dL (ref 0.44–1.00)
GFR calc Af Amer: >60 mL/min (ref >60)
GFR calc non Af Amer: 57 mL/min — ABNORMAL LOW (ref >60)
Glucose, Bld: 113 mg/dL — ABNORMAL HIGH (ref 70–99)
Potassium: 3.7 mmol/L (ref 3.5–5.1)
Sodium: 142 mmol/L (ref 135–145)
Total Bilirubin: 0.6 mg/dL (ref 0.3–1.2)
Total Protein: 7.1 g/dL (ref 6.5–8.1)

## 2020-05-18 LAB — RETICULOCYTES
Immature Retic Fract: 7.5 % (ref 2.3–15.9)
RBC.: 2.48 MIL/uL — ABNORMAL LOW (ref 3.87–5.11)
Retic Count, Absolute: 16.9 10*3/uL — ABNORMAL LOW (ref 19.0–186.0)
Retic Ct Pct: 0.7 % (ref 0.4–3.1)

## 2020-05-18 LAB — IRON AND TIBC
Iron: 142 ug/dL (ref 41–142)
Saturation Ratios: 55 % (ref 21–57)
TIBC: 259 ug/dL (ref 236–444)
UIBC: 116 ug/dL — ABNORMAL LOW (ref 120–384)

## 2020-05-18 LAB — CBC WITH DIFFERENTIAL/PLATELET
Abs Immature Granulocytes: 0.01 10*3/uL (ref 0.00–0.07)
Basophils Absolute: 0 10*3/uL (ref 0.0–0.1)
Basophils Relative: 0 %
Eosinophils Absolute: 0 10*3/uL (ref 0.0–0.5)
Eosinophils Relative: 1 %
HCT: 26.8 % — ABNORMAL LOW (ref 36.0–46.0)
Hemoglobin: 8.6 g/dL — ABNORMAL LOW (ref 12.0–15.0)
Immature Granulocytes: 1 %
Lymphocytes Relative: 66 %
Lymphs Abs: 0.7 10*3/uL (ref 0.7–4.0)
MCH: 34.1 pg — ABNORMAL HIGH (ref 26.0–34.0)
MCHC: 32.1 g/dL (ref 30.0–36.0)
MCV: 106.3 fL — ABNORMAL HIGH (ref 80.0–100.0)
Monocytes Absolute: 0.1 10*3/uL (ref 0.1–1.0)
Monocytes Relative: 6 %
Neutro Abs: 0.3 10*3/uL — CL (ref 1.7–7.7)
Neutrophils Relative %: 26 %
Platelets: 101 10*3/uL — ABNORMAL LOW (ref 150–400)
RBC: 2.52 MIL/uL — ABNORMAL LOW (ref 3.87–5.11)
RDW: 17.7 % — ABNORMAL HIGH (ref 11.5–15.5)
WBC: 1.1 10*3/uL — ABNORMAL LOW (ref 4.0–10.5)
nRBC: 0 % (ref 0.0–0.2)

## 2020-05-18 LAB — FERRITIN: Ferritin: 244 ng/mL (ref 11–307)

## 2020-05-18 NOTE — Telephone Encounter (Signed)
This RN spoke with pt and husband per noted lab results and need to hold hydrea per MD review.  Discussed lab values including neutropenic precautions.  Scheduled for lab in one week and for pt and husband to wait on lab results before leaving for MD review and recommendation on restarting hydrea and at what dose.  Pt and husband verbalized understanding.

## 2020-05-19 ENCOUNTER — Emergency Department (HOSPITAL_COMMUNITY)
Admission: EM | Admit: 2020-05-19 | Discharge: 2020-05-19 | Disposition: A | Discharge status: Home / Self Care | Payer: Medicare HMO | Attending: Emergency Medicine | Admitting: Emergency Medicine

## 2020-05-19 ENCOUNTER — Other Ambulatory Visit: Payer: Self-pay

## 2020-05-19 ENCOUNTER — Encounter (HOSPITAL_COMMUNITY): Payer: Self-pay

## 2020-05-19 ENCOUNTER — Ambulatory Visit (HOSPITAL_COMMUNITY)
Admission: RE | Admit: 2020-05-19 | Discharge: 2020-05-19 | Disposition: A | Discharge status: Home / Self Care | Payer: Medicare HMO | Source: Ambulatory Visit | Attending: Oncology | Admitting: Oncology

## 2020-05-19 DIAGNOSIS — R799 Abnormal finding of blood chemistry, unspecified: Secondary | ICD-10-CM | POA: Diagnosis present

## 2020-05-19 DIAGNOSIS — I700 Atherosclerosis of aorta: Secondary | ICD-10-CM | POA: Diagnosis not present

## 2020-05-19 DIAGNOSIS — Z87891 Personal history of nicotine dependence: Secondary | ICD-10-CM | POA: Diagnosis not present

## 2020-05-19 DIAGNOSIS — Z95828 Presence of other vascular implants and grafts: Secondary | ICD-10-CM | POA: Insufficient documentation

## 2020-05-19 DIAGNOSIS — T451X5A Adverse effect of antineoplastic and immunosuppressive drugs, initial encounter: Secondary | ICD-10-CM | POA: Diagnosis not present

## 2020-05-19 DIAGNOSIS — I10 Essential (primary) hypertension: Secondary | ICD-10-CM | POA: Insufficient documentation

## 2020-05-19 DIAGNOSIS — Z79899 Other long term (current) drug therapy: Secondary | ICD-10-CM | POA: Insufficient documentation

## 2020-05-19 DIAGNOSIS — R251 Tremor, unspecified: Secondary | ICD-10-CM | POA: Diagnosis not present

## 2020-05-19 DIAGNOSIS — I82220 Acute embolism and thrombosis of inferior vena cava: Secondary | ICD-10-CM | POA: Diagnosis not present

## 2020-05-19 DIAGNOSIS — R6883 Chills (without fever): Secondary | ICD-10-CM | POA: Diagnosis not present

## 2020-05-19 DIAGNOSIS — N281 Cyst of kidney, acquired: Secondary | ICD-10-CM | POA: Diagnosis not present

## 2020-05-19 DIAGNOSIS — I8290 Acute embolism and thrombosis of unspecified vein: Secondary | ICD-10-CM | POA: Insufficient documentation

## 2020-05-19 DIAGNOSIS — D75839 Thrombocytosis, unspecified: Secondary | ICD-10-CM

## 2020-05-19 DIAGNOSIS — D473 Essential (hemorrhagic) thrombocythemia: Secondary | ICD-10-CM | POA: Insufficient documentation

## 2020-05-19 DIAGNOSIS — D702 Other drug-induced agranulocytosis: Secondary | ICD-10-CM | POA: Diagnosis not present

## 2020-05-19 DIAGNOSIS — E039 Hypothyroidism, unspecified: Secondary | ICD-10-CM | POA: Diagnosis not present

## 2020-05-19 DIAGNOSIS — I2609 Other pulmonary embolism with acute cor pulmonale: Secondary | ICD-10-CM | POA: Insufficient documentation

## 2020-05-19 DIAGNOSIS — R7303 Prediabetes: Secondary | ICD-10-CM | POA: Insufficient documentation

## 2020-05-19 DIAGNOSIS — D701 Agranulocytosis secondary to cancer chemotherapy: Secondary | ICD-10-CM | POA: Diagnosis not present

## 2020-05-19 DIAGNOSIS — I82421 Acute embolism and thrombosis of right iliac vein: Secondary | ICD-10-CM | POA: Diagnosis not present

## 2020-05-19 HISTORY — DX: Thrombocytosis, unspecified: D75.839

## 2020-05-19 LAB — URINALYSIS, ROUTINE W REFLEX MICROSCOPIC
Bilirubin Urine: NEGATIVE
Glucose, UA: NEGATIVE mg/dL
Hgb urine dipstick: NEGATIVE
Ketones, ur: NEGATIVE mg/dL
Leukocytes,Ua: NEGATIVE
Nitrite: POSITIVE — AB
Protein, ur: NEGATIVE mg/dL
Specific Gravity, Urine: 1.045 — ABNORMAL HIGH (ref 1.005–1.030)
pH: 6 (ref 5.0–8.0)

## 2020-05-19 LAB — BASIC METABOLIC PANEL
Anion gap: 9 (ref 5–15)
BUN: 20 mg/dL (ref 8–23)
CO2: 24 mmol/L (ref 22–32)
Calcium: 9.6 mg/dL (ref 8.9–10.3)
Chloride: 105 mmol/L (ref 98–111)
Creatinine, Ser: 0.84 mg/dL (ref 0.44–1.00)
GFR calc Af Amer: >60 mL/min (ref >60)
GFR calc non Af Amer: >60 mL/min (ref >60)
Glucose, Bld: 144 mg/dL — ABNORMAL HIGH (ref 70–99)
Potassium: 3.4 mmol/L — ABNORMAL LOW (ref 3.5–5.1)
Sodium: 138 mmol/L (ref 135–145)

## 2020-05-19 LAB — GLUCOSE, CAPILLARY: Glucose-Capillary: 99 mg/dL (ref 70–99)

## 2020-05-19 LAB — CBC WITH DIFFERENTIAL/PLATELET
Abs Immature Granulocytes: 0 10*3/uL (ref 0.00–0.07)
Basophils Absolute: 0 10*3/uL (ref 0.0–0.1)
Basophils Relative: 0 %
Eosinophils Absolute: 0 10*3/uL (ref 0.0–0.5)
Eosinophils Relative: 1 %
HCT: 25.8 % — ABNORMAL LOW (ref 36.0–46.0)
Hemoglobin: 8.4 g/dL — ABNORMAL LOW (ref 12.0–15.0)
Immature Granulocytes: 0 %
Lymphocytes Relative: 62 %
Lymphs Abs: 0.8 10*3/uL (ref 0.7–4.0)
MCH: 35 pg — ABNORMAL HIGH (ref 26.0–34.0)
MCHC: 32.6 g/dL (ref 30.0–36.0)
MCV: 107.5 fL — ABNORMAL HIGH (ref 80.0–100.0)
Monocytes Absolute: 0.1 10*3/uL (ref 0.1–1.0)
Monocytes Relative: 6 %
Neutro Abs: 0.4 10*3/uL — ABNORMAL LOW (ref 1.7–7.7)
Neutrophils Relative %: 31 %
Platelets: 131 10*3/uL — ABNORMAL LOW (ref 150–400)
RBC: 2.4 MIL/uL — ABNORMAL LOW (ref 3.87–5.11)
RDW: 17.8 % — ABNORMAL HIGH (ref 11.5–15.5)
WBC: 1.3 10*3/uL — CL (ref 4.0–10.5)
nRBC: 0 % (ref 0.0–0.2)

## 2020-05-19 LAB — LACTIC ACID, PLASMA: Lactic Acid, Venous: 2 mmol/L (ref 0.5–1.9)

## 2020-05-19 MED ORDER — SODIUM CHLORIDE (PF) 0.9 % IJ SOLN
INTRAMUSCULAR | Status: AC
  Filled 2020-05-19: qty 50

## 2020-05-19 MED ORDER — IOHEXOL 350 MG/ML SOLN
100.0000 mL | Freq: Once | INTRAVENOUS | Status: AC | PRN
  Administered 2020-05-19: 100 mL via INTRAVENOUS

## 2020-05-19 MED ORDER — SODIUM CHLORIDE 0.9 % IV BOLUS
500.0000 mL | Freq: Once | INTRAVENOUS | Status: AC
  Administered 2020-05-19: 500 mL via INTRAVENOUS

## 2020-05-19 NOTE — ED Notes (Signed)
Date and time results received: 05/19/20 5:39 PM  (use smartphrase ".now" to insert current time)  Test: WBC Critical Value: 1.3  Name of Provider Notified: Darl Householder  Orders Received? Or Actions Taken?: Orders Received - See Orders for details

## 2020-05-19 NOTE — ED Notes (Signed)
CRITICAL VALUE ALERT  Critical Value:  Lactic Acid 2.0  Date & Time Notied:  1756    05/19/20  Provider Notified: Darl Householder MD  Orders Received/Actions taken: None at this time

## 2020-05-19 NOTE — ED Triage Notes (Addendum)
Patient states she had a CT scan to see if her IVC filter could be removed. Patient was having increased shakingll over. Patient's husband states her WBC, platelets, and RBC count was low from labs drawn yesterday.

## 2020-05-19 NOTE — Discharge Instructions (Addendum)
We will call and update you regarding the results of your blood culture. If you have any other episodes of shaking, temperature over 101, or any new symptoms arise, please return to the closest emergency room. Please continue to not take your hydroxyurea until instructed by your hematologist.

## 2020-05-19 NOTE — ED Provider Notes (Signed)
[facility] DEPT Provider Note   CSN: [id number] Arrival date & time: 05/19/20  1409     History Chief Complaint  Patient presents with  . Abnormal Lab    [name] is a 74 y.o. female.  with a PMHx of nontraumatic subcortical hemorrhage, polycythemia vera who was sent to the ED after she had vigorous shaking of her hands, arms, and legs while having a CT of the head performed. She notes the shaking lasted 45 minutes. She is unsure if she received contrast or not for the imaging study. She notes she did feel warm during the shaking spell, but denies any sweating. She denies any loss of consciousness during the shaking and is able to recall the event. She denies having shaking like this in the past.   She denies any recent fever, chills, neck pain, chest pain, cough, shortness of breath, nausea, vomiting, night sweats, fatigue, leg swelling, recent unexplained weight loss, and recent illnesses.  The history is provided by the patient and the spouse.      Past Medical History:  Diagnosis Date  . Hepatitis C   . Hyperlipidemia   . Hypertension   . Osteoporosis   . Stroke ([place])   . Thrombocytosis [facility])     Patient Active Problem List   Diagnosis Date Noted  . Cerebral venous sinus thrombosis 03/30/2020  . Presence of IVC filter   . Benign essential HTN   . Peripheral edema   . Impulsive   . Hypoalbuminemia due to protein-calorie malnutrition ([place])   . Anemia   . Prediabetes   . Steroid-induced hyperglycemia   . Embolic stroke ([place]) 37/85/8850  . Dysphagia   . Vascular headache   . Thrombocytosis ([place])   . Venous thrombosis   . Polycythemia   . Encounter for central line care   . Nontraumatic subcortical hemorrhage of right cerebral hemisphere ([place])   . Abnormal CT of the chest   . Atrial flutter with rapid ventricular response ([place])   . Pulmonary embolus ([place]) 02/22/2020  . Polycythemia vera ([place]) 02/17/2020  . Likely Cerebral  amyloid angiopathy ([place]) 02/17/2020  . IVH (intraventricular hemorrhage) ([place]) 02/14/2020  . TIA (transient ischemic attack) 08/17/2014  . Acute sinusitis 08/16/2014  . Headache 08/15/2014  . Hypothyroidism 08/15/2014  . Hepatitis C   . Hyperlipidemia   . Hypertension     Past Surgical History:  Procedure Laterality Date  . IR ANGIO INTRA EXTRACRAN SEL INTERNAL CAROTID BILAT MOD SED  02/24/2020  . IR ANGIO VERTEBRAL SEL VERTEBRAL UNI R MOD SED  02/24/2020  . IR ANGIOGRAM SELECTIVE EACH ADDITIONAL VESSEL  02/24/2020  . IR IVC FILTER PLMT / S&I /IMG GUID/MOD SED  02/22/2020  . IR THROMBECT VENO MECH MOD SED  02/24/2020  . IR US GUIDE VASC ACCESS RIGHT  02/24/2020  . IR US GUIDE VASC ACCESS RIGHT  02/24/2020  . IR VENO SAGITTAL SINUS  02/24/2020  . IR VENO/JUGULAR RIGHT  02/24/2020  . RADIOLOGY WITH ANESTHESIA N/A 02/24/2020   Procedure: IR WITH ANESTHESIA;  Surgeon: Radiologist, Medication, MD;  Location: [facility];  Service: Radiology;  Laterality: N/A;     OB History   No obstetric history on file.     Family History  Problem Relation Age of Onset  . Heart disease Mother   . Heart disease Father     Social History   Tobacco Use  . Smoking status: Former Research scientist (life sciences)  . Smokeless tobacco: Never Used  Vaping  Use  . Vaping Use: Never used  Substance Use Topics  . Alcohol use: No  . Drug use: No    Home Medications Prior to Admission medications   Medication Sig Start Date End Date Taking? Authorizing Provider  amLODipine (NORVASC) 10 MG tablet Take 1 tablet (10 mg total) by mouth daily. 03/29/20  Yes Love, Ivan Anchors, PA-C  atorvastatin (LIPITOR) 10 MG tablet Take 1 tablet (10 mg total) by mouth daily. Patient taking differently: Take 10 mg by mouth every evening.  03/29/20  Yes Love, Ivan Anchors, PA-C  fluticasone furoate-vilanterol (BREO ELLIPTA) 200-25 MCG/INH AEPB Inhale 1 puff into the lungs daily. 03/29/20  Yes Love, Ivan Anchors, PA-C  hydroxyurea (HYDREA) 500 MG capsule Take 3 capsules  (1,500 mg total) by mouth daily. May take with food to minimize GI side effects. 04/24/20  Yes Magrinat, Virgie Dad, MD  levothyroxine (SYNTHROID) 125 MCG tablet Take 1 tablet (125 mcg total) by mouth daily before breakfast. 03/29/20  Yes Love, Ivan Anchors, PA-C  losartan (COZAAR) 50 MG tablet Take 1 tablet (50 mg total) by mouth 2 (two) times daily. 03/29/20  Yes Love, Ivan Anchors, PA-C  pantoprazole (PROTONIX) 40 MG tablet Take 1 tablet (40 mg total) by mouth daily. 03/29/20  Yes Love, Ivan Anchors, PA-C  rivaroxaban (XARELTO) 20 MG TABS tablet Take 1 tablet (20 mg total) by mouth daily with breakfast. 03/29/20  Yes Love, Pamela S, PA-C  senna-docusate (SENOKOT-S) 8.6-50 MG tablet Take 2 tablets by mouth 2 (two) times daily. Patient taking differently: Take 1 tablet by mouth daily.  03/29/20  Yes Love, Ivan Anchors, PA-C  umeclidinium bromide (INCRUSE ELLIPTA) 62.5 MCG/INH AEPB Inhale 1 puff into the lungs daily. 03/29/20  Yes Love, Ivan Anchors, PA-C  hydrALAZINE (APRESOLINE) 50 MG tablet Take 1 tablet (50 mg total) by mouth 3 (three) times daily. Patient not taking: Reported on 05/12/2020 03/29/20   Love, Ivan Anchors, PA-C  QUEtiapine (SEROQUEL) 25 MG tablet Take 1 tablet (25 mg total) by mouth at bedtime as needed (for agitation). Patient not taking: Reported on 05/12/2020 03/29/20   Bary Leriche, PA-C    Allergies    Heparin, Amoxicillin-pot clavulanate, and Erythromycin  Review of Systems   Review of Systems  Constitutional: Negative for diaphoresis, fatigue, fever and unexpected weight change.  Respiratory: Negative for cough and shortness of breath.   Cardiovascular: Negative for chest pain and leg swelling.  Gastrointestinal: Negative for abdominal pain, nausea and vomiting.  Musculoskeletal: Negative for back pain and neck pain.       Shaking  Neurological: Negative for dizziness and weakness.  All other systems reviewed and are negative.   Physical Exam Updated Vital Signs BP 105/66   Pulse 85   Temp 98.7  F (37.1 C) (Oral)   Resp 18   Ht 5\' 3"  (1.6 m)   Wt 63.5 kg   SpO2 99%   BMI 24.80 kg/m   Physical Exam Vitals and nursing note reviewed.  Constitutional:      General: She is not in acute distress.    Appearance: Normal appearance. She is normal weight. She is not ill-appearing, toxic-appearing or diaphoretic.  HENT:     Nose: Nose normal.  Cardiovascular:     Rate and Rhythm: Normal rate and regular rhythm.     Pulses: Normal pulses.     Heart sounds: Normal heart sounds. No murmur heard.   Pulmonary:     Effort: Pulmonary effort is normal. No respiratory distress.  Breath sounds: Normal breath sounds. No stridor. No wheezing, rhonchi or rales.  Abdominal:     General: Abdomen is flat. There is no distension.     Palpations: Abdomen is soft.     Tenderness: There is no abdominal tenderness. There is no right CVA tenderness, left CVA tenderness, guarding or rebound.     Comments: No splenomegaly appreciated upon abdominal exam   Musculoskeletal:     Cervical back: Normal range of motion and neck supple. No rigidity or tenderness.  Skin:    General: Skin is warm and dry.     Coloration: Skin is not jaundiced.  Neurological:     General: No focal deficit present.     Mental Status: She is alert. Mental status is at baseline.  Psychiatric:        Mood and Affect: Mood normal.        Behavior: Behavior normal.     ED Results / Procedures / Treatments   Labs (all labs ordered are listed, but only abnormal results are displayed) Labs Reviewed  CBC WITH DIFFERENTIAL/PLATELET - Abnormal; Notable for the following components:      Result Value   WBC 1.3 (*)    RBC 2.40 (*)    Hemoglobin 8.4 (*)    HCT 25.8 (*)    MCV 107.5 (*)    MCH 35.0 (*)    RDW 17.8 (*)    Platelets 131 (*)    Neutro Abs 0.4 (*)    All other components within normal limits  BASIC METABOLIC PANEL - Abnormal; Notable for the following components:   Potassium 3.4 (*)    Glucose, Bld 144 (*)     All other components within normal limits  LACTIC ACID, PLASMA - Abnormal; Notable for the following components:   Lactic Acid, Venous 2.0 (*)    All other components within normal limits  URINALYSIS, ROUTINE W REFLEX MICROSCOPIC - Abnormal; Notable for the following components:   Specific Gravity, Urine 1.045 (*)    Nitrite POSITIVE (*)    Bacteria, UA MANY (*)    All other components within normal limits  CULTURE, BLOOD (ROUTINE X 2)  CULTURE, BLOOD (ROUTINE X 2)  URINE CULTURE    EKG None  Radiology CT VENOGRAM ABD/PEL  Result Date: 05/19/2020 CLINICAL DATA:  DVT. To assess clot burden prior to removal of the caval filter. EXAM: CT VENOGRAM OF THE ABDOMEN AND PELVIS TECHNIQUE: Multidetector CT imaging of the abdomen and pelvis was performed using the standard protocol during bolus administration of intravenous contrast. Multiplanar CT image reconstructions and MIPs were obtained to evaluate the vascular anatomy. CONTRAST:  123mL OMNIPAQUE IOHEXOL 350 MG/ML SOLN COMPARISON:  CT dated 03/03/2020 FINDINGS: Lower chest: There is presumed atelectasis at the lung bases.The heart is enlarged. Hepatobiliary: The liver is normal. Status post cholecystectomy.There is no biliary ductal dilation. Pancreas: Normal contours without ductal dilatation. No peripancreatic fluid collection. Spleen: Unremarkable. Adrenals/Urinary Tract: --Adrenal glands: Unremarkable. --Right kidney/ureter: No hydronephrosis or radiopaque kidney stones. --Left kidney/ureter: There is a stable left renal cyst. --Urinary bladder: Unremarkable. Stomach/Bowel: --Stomach/Duodenum: No hiatal hernia or other gastric abnormality. Normal duodenal course and caliber. --Small bowel: Unremarkable. --Colon: Rectosigmoid diverticulosis without acute inflammation. --Appendix: Normal. Vascular/Lymphatic: Atherosclerotic calcification is present within the non-aneurysmal abdominal aorta, without hemodynamically significant stenosis. There is a  well-positioned IVC filter in place. The previously demonstrated caval thrombus appears to resolved. There is a probable small amount of residual clot within the colon of the IVC filter (  axial series 2, image 37). There appears to be residual nonocclusive thrombus within the right common iliac vein (axial series 5, image 54) and the right external iliac vein (axial series 5, image 63). --No retroperitoneal lymphadenopathy. --No mesenteric lymphadenopathy. --No pelvic or inguinal lymphadenopathy. Reproductive: Unremarkable Other: No ascites or free air. The abdominal wall is normal. Musculoskeletal. There is a chronic compression fracture of the T10 vertebral body. There is no evidence for an acute displaced fracture on this study. IMPRESSION: 1. Significant interval improvement in clot burden. There is residual nonocclusive thrombus within the right common and external iliac veins as well as a small thrombus at the cone of the IVC filter. 2. Well-positioned IVC filter, well centered within the cava. 3. Additional chronic incidental findings as detailed above. Aortic Atherosclerosis (ICD10-I70.0). Electronically Signed   By: Constance Holster M.D.   On: 05/19/2020 14:47    Procedures Procedures (including critical care time)  Medications Ordered in ED Medications  sodium chloride 0.9 % bolus 500 mL (0 mLs Intravenous Stopped 05/19/20 1936)    ED Course  I have reviewed the triage vital signs and the nursing notes.  Pertinent labs & imaging results that were available during my care of the patient were reviewed by me and considered in my medical decision making (see chart for details).    MDM Rules/Calculators/A&P                          [name] is a 74 y/o F with a recent CBC indicating pancytopenia, who presents to the ED with her husband after having an episode of shaking while having a CT venogram of the abdomen and pelvis to assess the patient's IVC filter.   Shaking - Considering  neutropenic fever or allergic reaction to contrast dye as potential explanations for patient's shaking spell. Unsure if rigors or chills based on patient's history.   - While in the ED, patient's temperature remained within normal limits at 36.7 C.  - Will repeat CBC w/ differential, CMP to assess for worsening pancytopenia and compare to yesterdays lab results. Patient stopped taking 1500 mg hydroxyurea yesterday.  - Will also order blood cultures, urine cultures, and lactate to further assess for infection as potential cause of her shaking.  -Deposition to be determined once laboratory data is complete and patient's status is reassessed.   7:41 PM Absolute Neutrophil Count of .4 from .3 yesterday.  WBC of 1.3  from 1.1 yesterday Lactic acid of 2.0  - Due to patient's vital signs remaining stable during ED course as well as an increase in her WBC and absolute neutrophil count, have low suspician her episode of shaking was due to rigors or neutropenic fever. Will plan to discharge  patient home and she will be notified of her blood culture results. Patient was instructed to return to closest ED if she has any new signs or symptoms, more episodes of shaking, fever greater than 101, or any other concerns. Also instructed to continue to not take her hydroxyurea. She will follow up with her hematologist.  Final Clinical Impression(s) / ED Diagnoses Final diagnoses:  Other drug-induced neutropenia ([place])  Chills (without fever)    Rx / DC Orders ED Discharge Orders    None       Riesa Pope, MD 05/19/20 1942    Drenda Freeze, MD 05/22/20 1541

## 2020-05-22 ENCOUNTER — Other Ambulatory Visit: Payer: Self-pay | Admitting: Oncology

## 2020-05-22 LAB — URINE CULTURE: Culture: >=100000 — AB

## 2020-05-22 NOTE — Progress Notes (Signed)
Checked with [name] on how [name] is doing.  She now has quite low blood pressure so her blood pressure medicines are being changed through her primary care physician and I updated the list today.  She had a little bit of fever over the weekend, up to 100, but that has resolved.  She has no other symptoms.  The initial look at the IVC filter suggested the clots pretty much resolved and I strongly urged them to go ahead and get that removed.  Of course we are going to continue her anticoagulation a few more months at least.  Aside from this she will have her counts checked 05/25/2020 here and we will adjust her Hydrea accordingly

## 2020-05-23 ENCOUNTER — Ambulatory Visit
Admission: RE | Admit: 2020-05-23 | Discharge: 2020-05-23 | Disposition: A | Discharge status: Home / Self Care | Payer: Medicare HMO | Source: Ambulatory Visit | Attending: Student | Admitting: Student

## 2020-05-23 ENCOUNTER — Other Ambulatory Visit: Payer: Self-pay

## 2020-05-23 ENCOUNTER — Telehealth: Payer: Self-pay

## 2020-05-23 ENCOUNTER — Encounter: Payer: Self-pay | Admitting: *Deleted

## 2020-05-23 DIAGNOSIS — T82868A Thrombosis of vascular prosthetic devices, implants and grafts, initial encounter: Secondary | ICD-10-CM | POA: Diagnosis not present

## 2020-05-23 DIAGNOSIS — Z95828 Presence of other vascular implants and grafts: Secondary | ICD-10-CM

## 2020-05-23 DIAGNOSIS — Z7901 Long term (current) use of anticoagulants: Secondary | ICD-10-CM | POA: Diagnosis not present

## 2020-05-23 HISTORY — PX: IR RADIOLOGIST EVAL & MGMT: IMG5224

## 2020-05-23 NOTE — Telephone Encounter (Deleted)
Post ED Visit - Positive Culture Follow-up: Unsuccessful Patient Follow-up  Culture assessed and recommendations reviewed by:  []  Elenor Quinones, Pharm.D. []  Heide Guile, Pharm.D., BCPS AQ-ID []  Parks Neptune, Pharm.D., BCPS []  Alycia Rossetti, Pharm.D., BCPS []  [place], Pharm.D., BCPS, AAHIVP []  Legrand Como, Pharm.D., BCPS, AAHIVP []  Wynell Balloon, PharmD []  Vincenza Hews, PharmD, BCPS Reuel Boom Pharm D Positive urine culture  [x]  Patient discharged without antimicrobial prescription and treatment is now indicated []  Organism is resistant to prescribed ED discharge antimicrobial []  Patient with positive blood cultures  PT needs Cipro 500 mg BID x 5 days per Lurline Del MD  Unable to contact patient after 3 attempts, letter will be sent to address on file  Genia Del 05/23/2020, 2:43 PM

## 2020-05-23 NOTE — Progress Notes (Signed)
Chief Complaint: Patient was consulted remotely today (TeleHealth) for IVC filter management.  Referring Physician(s): Magrinat, study  History of Present Illness: [name] is a 74 y.o. female with a history of intraventricular hemorrhage due to deep cerebral venous sinus thrombosis likely related to polycythemia.  During a prolonged hospital course, patient was found to have bilateral pulmonary embolism and heparin-induced thrombocytopenia.  IVC filter was placed due to the pulmonary embolism.  Following placement of the filter, follow-up CT demonstrated extensive DVT in the bilateral iliac veins and including the infrarenal IVC.  There was thrombus within the IVC filter and probably in the proximal femoral veins.  Patient had a CT venogram performed on 05/19/2020 to evaluate the IVC and iliac venous thrombus.   Patient was scheduled to follow-up in clinic to discuss IVC filter management. The clinic visit was switched to telephone call because the patient was recently evaluated for fevers.  Patient was seen in the emergency department and had a positive urine culture for E. coli.  Patient was prescribed ciprofloxacin.  Patient says that she is feeling better and the fevers have resolved.   Past Medical History:  Diagnosis Date  . Hepatitis C   . Hyperlipidemia   . Hypertension   . Osteoporosis   . Stroke ([place])   . Thrombocytosis ([place])     Past Surgical History:  Procedure Laterality Date  . IR ANGIO INTRA EXTRACRAN SEL INTERNAL CAROTID BILAT MOD SED  02/24/2020  . IR ANGIO VERTEBRAL SEL VERTEBRAL UNI R MOD SED  02/24/2020  . IR ANGIOGRAM SELECTIVE EACH ADDITIONAL VESSEL  02/24/2020  . IR IVC FILTER PLMT / S&I /IMG GUID/MOD SED  02/22/2020  . IR THROMBECT VENO MECH MOD SED  02/24/2020  . IR US GUIDE VASC ACCESS RIGHT  02/24/2020  . IR US GUIDE VASC ACCESS RIGHT  02/24/2020  . IR VENO SAGITTAL SINUS  02/24/2020  . IR VENO/JUGULAR RIGHT  02/24/2020  . RADIOLOGY WITH ANESTHESIA N/A  02/24/2020   Procedure: IR WITH ANESTHESIA;  Surgeon: Radiologist, Medication, MD;  Location: [facility];  Service: Radiology;  Laterality: N/A;    Allergies: Heparin, Amoxicillin-pot clavulanate, and Erythromycin  Medications: Prior to Admission medications   Medication Sig Start Date End Date Taking? Authorizing Provider  amLODipine (NORVASC) 10 MG tablet Take 0.5 tablets (5 mg total) by mouth daily. 05/22/20   Magrinat, Virgie Dad, MD  atorvastatin (LIPITOR) 10 MG tablet Take 1 tablet (10 mg total) by mouth daily. Patient taking differently: Take 10 mg by mouth every evening.  03/29/20   Love, Ivan Anchors, PA-C  fluticasone furoate-vilanterol (BREO ELLIPTA) 200-25 MCG/INH AEPB Inhale 1 puff into the lungs daily. 03/29/20   Love, Ivan Anchors, PA-C  hydroxyurea (HYDREA) 500 MG capsule Take 3 capsules (1,500 mg total) by mouth daily. May take with food to minimize GI side effects. 04/24/20   Magrinat, Virgie Dad, MD  levothyroxine (SYNTHROID) 125 MCG tablet Take 1 tablet (125 mcg total) by mouth daily before breakfast. 03/29/20   Love, Ivan Anchors, PA-C  losartan (COZAAR) 50 MG tablet Take 1 tablet (50 mg total) by mouth 2 (two) times daily. 03/29/20   Love, Ivan Anchors, PA-C  pantoprazole (PROTONIX) 40 MG tablet Take 1 tablet (40 mg total) by mouth daily. 03/29/20   Love, Ivan Anchors, PA-C  QUEtiapine (SEROQUEL) 25 MG tablet Take 1 tablet (25 mg total) by mouth at bedtime as needed (for agitation). Patient not taking: Reported on 05/12/2020 03/29/20   Bary Leriche,  PA-C  rivaroxaban (XARELTO) 20 MG TABS tablet Take 1 tablet (20 mg total) by mouth daily with breakfast. 03/29/20   Love, Ivan Anchors, PA-C  senna-docusate (SENOKOT-S) 8.6-50 MG tablet Take 2 tablets by mouth 2 (two) times daily. Patient taking differently: Take 1 tablet by mouth daily.  03/29/20   Love, Ivan Anchors, PA-C  umeclidinium bromide (INCRUSE ELLIPTA) 62.5 MCG/INH AEPB Inhale 1 puff into the lungs daily. 03/29/20   Bary Leriche, PA-C     Family History    Problem Relation Age of Onset  . Heart disease Mother   . Heart disease Father     Social History   Socioeconomic History  . Marital status: Married    Spouse name: Not on file  . Number of children: Not on file  . Years of education: Not on file  . Highest education level: Not on file  Occupational History  . Not on file  Tobacco Use  . Smoking status: Former Research scientist (life sciences)  . Smokeless tobacco: Never Used  Vaping Use  . Vaping Use: Never used  Substance and Sexual Activity  . Alcohol use: No  . Drug use: No  . Sexual activity: Not on file  Other Topics Concern  . Not on file  Social History Narrative  . Not on file   Social Determinants of Health   Financial Resource Strain:   . Difficulty of Paying Living Expenses:   Food Insecurity:   . Worried About Charity fundraiser in the Last Year:   . Arboriculturist in the Last Year:   Transportation Needs:   . Film/video editor (Medical):   Marland Kitchen Lack of Transportation (Non-Medical):   Physical Activity:   . Days of Exercise per Week:   . Minutes of Exercise per Session:   Stress:   . Feeling of Stress :   Social Connections:   . Frequency of Communication with Friends and Family:   . Frequency of Social Gatherings with Friends and Family:   . Attends Religious Services:   . Active Member of Clubs or Organizations:   . Attends Archivist Meetings:   Marland Kitchen Marital Status:    Review of symptoms: Recent fevers  Physical Exam No direct physical exam was performed  Vital Signs: There were no vitals taken for this visit.  Imaging: CT VENOGRAM ABD/PEL  Result Date: 05/19/2020 CLINICAL DATA:  DVT. To assess clot burden prior to removal of the caval filter. EXAM: CT VENOGRAM OF THE ABDOMEN AND PELVIS TECHNIQUE: Multidetector CT imaging of the abdomen and pelvis was performed using the standard protocol during bolus administration of intravenous contrast. Multiplanar CT image reconstructions and MIPs were obtained to  evaluate the vascular anatomy. CONTRAST:  167mL OMNIPAQUE IOHEXOL 350 MG/ML SOLN COMPARISON:  CT dated 03/03/2020 FINDINGS: Lower chest: There is presumed atelectasis at the lung bases.The heart is enlarged. Hepatobiliary: The liver is normal. Status post cholecystectomy.There is no biliary ductal dilation. Pancreas: Normal contours without ductal dilatation. No peripancreatic fluid collection. Spleen: Unremarkable. Adrenals/Urinary Tract: --Adrenal glands: Unremarkable. --Right kidney/ureter: No hydronephrosis or radiopaque kidney stones. --Left kidney/ureter: There is a stable left renal cyst. --Urinary bladder: Unremarkable. Stomach/Bowel: --Stomach/Duodenum: No hiatal hernia or other gastric abnormality. Normal duodenal course and caliber. --Small bowel: Unremarkable. --Colon: Rectosigmoid diverticulosis without acute inflammation. --Appendix: Normal. Vascular/Lymphatic: Atherosclerotic calcification is present within the non-aneurysmal abdominal aorta, without hemodynamically significant stenosis. There is a well-positioned IVC filter in place. The previously demonstrated caval thrombus appears to resolved. There  is a probable small amount of residual clot within the colon of the IVC filter (axial series 2, image 37). There appears to be residual nonocclusive thrombus within the right common iliac vein (axial series 5, image 54) and the right external iliac vein (axial series 5, image 63). --No retroperitoneal lymphadenopathy. --No mesenteric lymphadenopathy. --No pelvic or inguinal lymphadenopathy. Reproductive: Unremarkable Other: No ascites or free air. The abdominal wall is normal. Musculoskeletal. There is a chronic compression fracture of the T10 vertebral body. There is no evidence for an acute displaced fracture on this study. IMPRESSION: 1. Significant interval improvement in clot burden. There is residual nonocclusive thrombus within the right common and external iliac veins as well as a small  thrombus at the cone of the IVC filter. 2. Well-positioned IVC filter, well centered within the cava. 3. Additional chronic incidental findings as detailed above. Aortic Atherosclerosis (ICD10-I70.0). Electronically Signed   By: Constance Holster M.D.   On: 05/19/2020 14:47    Labs:  CBC: Recent Labs    04/07/20 0918 04/20/20 1021 05/18/20 1033 05/19/20 1509  WBC 5.2 5.9 1.1* 1.3*  HGB 9.7* 10.7* 8.6* 8.4*  HCT 31.6* 34.7* 26.8* 25.8*  PLT 545* 780* 101* 131*    COAGS: Recent Labs    02/14/20 0028 02/14/20 0028 02/24/20 1450 02/25/20 1853 03/20/20 0605 03/21/20 0508 03/21/20 1444 03/22/20 0533  INR 1.1  --  1.3*  --   --   --   --   --   APTT 40*   < > 42*   < > 56* 40* 51* 55*   < > = values in this interval not displayed.    BMP: Recent Labs    04/07/20 0918 04/20/20 1021 05/18/20 1033 05/19/20 1509  NA 138 137 142 138  K 5.4* 5.3* 3.7 3.4*  CL 105 106 109 105  CO2 21* 21* 21* 24  GLUCOSE 155* 174* 113* 144*  BUN 10 14 20 20   CALCIUM 9.6 10.0 10.1 9.6  CREATININE 0.97 1.06* 0.98 0.84  GFRNONAA 58* 52* 57* >60  GFRAA >60 >60 >60 >60    LIVER FUNCTION TESTS: Recent Labs    03/14/20 0530 04/07/20 0918 04/20/20 1021 05/18/20 1033  BILITOT 0.4 0.4 0.3 0.6  AST 22 86* 25 19  ALT 40 235* 38 25  ALKPHOS 85 110 86 52  PROT 5.1* 6.5 7.0 7.1  ALBUMIN 2.8* 3.1* 3.2* 3.8    TUMOR MARKERS: No results for input(s): AFPTM, CEA, CA199, CHROMGRNA in the last 8760 hours.  Assessment and Plan:  74 year old with complex medical situation including polycythemia, history of dural venous thrombosis resulting in intraventricular hemorrhage, bilateral pulmonary emboli and extensive DVT in the lower abdomen and pelvis with IVC filter in place.  Recent CT venogram of the abdomen and pelvis confirms that a large amount of the thrombus in the distal IVC and iliac veins has resolved.  There is a small amount of thrombus remaining within the IVC filter itself.  I discussed  the CT venogram findings with patient and family.  Patient is currently anticoagulated on Xarelto.  I explained that the filter should be safe to remove if there is only a small clot burden within the filter, less than 25%.  Because the thrombus appears to be resolving on Xarelto, I suggest that we plan for IVC retrieval in 4 to 6 weeks in order to give a little bit more time for clot resolution.  Patient can stay on anticoagulation for the procedure.  We discussed the technical aspects of the procedure such as getting jugular access and repeat venogram prior to filter retrieval.  Patient seems to have a good understanding of the procedure and is agreeable to the IVC filter retrieval.  Plan for IVC venogram and filter retrieval in 4 to 6 weeks.    Electronically Signed: Burman Riis 05/23/2020, 2:24 PM   I spent a total of    10 Minutes in remote  clinical consultation, greater than 50% of which was counseling/coordinating care for IVC filter management.    Visit type: Audio only (telephone). Audio (no video) only due to recent fevers.. Alternative for in-person consultation at [facility], [address] [place], [place], [place]. This visit type was conducted due to national recommendations for restrictions regarding the COVID-19 Pandemic (e.g. social distancing).  This format is felt to be most appropriate for this patient at this time.  All issues noted in this document were discussed and addressed.  Patient ID: [name], female   DOB: 11-07-1946, 74 y.o.   MRN: [id number]

## 2020-05-23 NOTE — Progress Notes (Signed)
ED Antimicrobial Stewardship Positive Culture Follow Up   [name] is an 74 y.o. female who presented to [facility] on 05/19/2020 with a chief complaint of  Chief Complaint  Patient presents with  . Abnormal Lab    Recent Results (from the past 720 hour(s))  Urine Culture     Status: Abnormal   Collection Time: 05/19/20  3:54 PM   Specimen: Urine, Random  Result Value Ref Range Status   Specimen Description   Final    URINE, RANDOM Performed at [facility] [address], [place], [place] [address]    Special Requests   Final    NONE Performed at [facility], [place] [address] [place], [place] [address]    Culture >=100,000 COLONIES/mL ESCHERICHIA COLI (A)  Final   Report Status 05/22/2020 FINAL  Final   Organism ID, Bacteria ESCHERICHIA COLI (A)  Final      Susceptibility   Escherichia coli - MIC*    AMPICILLIN >=32 RESISTANT Resistant     CEFAZOLIN <=4 SENSITIVE Sensitive     CEFTRIAXONE <=0.25 SENSITIVE Sensitive     CIPROFLOXACIN <=0.25 SENSITIVE Sensitive     GENTAMICIN >=16 RESISTANT Resistant     IMIPENEM <=0.25 SENSITIVE Sensitive     NITROFURANTOIN <=16 SENSITIVE Sensitive     TRIMETH/SULFA >=320 RESISTANT Resistant     AMPICILLIN/SULBACTAM 4 SENSITIVE Sensitive     PIP/TAZO <=4 SENSITIVE Sensitive     * >=100,000 COLONIES/mL ESCHERICHIA COLI  Blood culture (routine x 2)     Status: None (Preliminary result)   Collection Time: 05/19/20  4:44 PM   Specimen: BLOOD  Result Value Ref Range Status   Specimen Description   Final    BLOOD RIGHT WRIST Performed at [facility] [address], [place], [place] [address]    Special Requests   Final    BOTTLES DRAWN AEROBIC AND ANAEROBIC Blood Culture adequate volume Performed at [facility] [address] [place], [place] [address]    Culture   Final    NO GROWTH 4 DAYS Performed at [facility], [place] [address],  [place], [place] [address]    Report Status PENDING  Incomplete  Blood culture (routine x 2)     Status: None (Preliminary result)   Collection Time: 05/19/20  4:44 PM   Specimen: BLOOD  Result Value Ref Range Status   Specimen Description   Final    BLOOD LEFT ANTECUBITAL Performed at [facility] [address], [place], [place] [address]    Special Requests   Final    BOTTLES DRAWN AEROBIC AND ANAEROBIC Blood Culture adequate volume Performed at [facility] [address] [place], [place] [address]    Culture   Final    NO GROWTH 4 DAYS Performed at [facility], [place] [address] [place], [place] [address]    Report Status PENDING  Incomplete    [x]  Patient discharged originally without antimicrobial agent and treatment is now indicated - neutropenic without dysuria but with other equivocal signs of infection (hypotension, low grade temps)  New antibiotic prescription: Cipro 500 mg PO bid x 5d (#10)  Provider: Lurline Del (Oncology)   [name] 05/23/2020, 11:48 AM Clinical Pharmacist [phone]

## 2020-05-23 NOTE — Telephone Encounter (Signed)
Post ED Visit - Positive Culture Follow-up: Successful Patient Follow-Up  Culture assessed and recommendations reviewed by:  []  Elenor Quinones, Pharm.D. []  Heide Guile, Pharm.D., BCPS AQ-ID []  Parks Neptune, Pharm.D., BCPS []  Alycia Rossetti, Pharm.D., BCPS []  [place], Pharm.D., BCPS, AAHIVP []  Legrand Como, Pharm.D., BCPS, AAHIVP []  Salome Arnt, PharmD, BCPS []  Johnnette Gourd, PharmD, BCPS []  Hughes Better, PharmD, BCPS []  Leeroy Cha, PharmD Reuel Boom Pharm D Positive urine culture  [x]  Patient discharged without antimicrobial prescription and treatment is now indicated []  Organism is resistant to prescribed ED discharge antimicrobial []  Patient with positive blood cultures  Changes discussed with ED provider: Lurline Del [facility] New antibiotic prescription Ciprofloxacin 500 mg BID x 5 days Called to [place]  Contacted patient, date 05/23/20, time [place]   [place]nee Gambone, Carolynn Comment 05/23/2020, 3:41 PM

## 2020-05-24 LAB — CULTURE, BLOOD (ROUTINE X 2)
Culture: NO GROWTH
Culture: NO GROWTH
Special Requests: ADEQUATE
Special Requests: ADEQUATE

## 2020-05-25 ENCOUNTER — Other Ambulatory Visit: Payer: Self-pay

## 2020-05-25 ENCOUNTER — Inpatient Hospital Stay: Payer: Medicare HMO

## 2020-05-25 DIAGNOSIS — I639 Cerebral infarction, unspecified: Secondary | ICD-10-CM | POA: Diagnosis not present

## 2020-05-25 DIAGNOSIS — M810 Age-related osteoporosis without current pathological fracture: Secondary | ICD-10-CM | POA: Diagnosis not present

## 2020-05-25 DIAGNOSIS — D649 Anemia, unspecified: Secondary | ICD-10-CM | POA: Diagnosis not present

## 2020-05-25 DIAGNOSIS — E039 Hypothyroidism, unspecified: Secondary | ICD-10-CM | POA: Diagnosis not present

## 2020-05-25 DIAGNOSIS — D45 Polycythemia vera: Secondary | ICD-10-CM | POA: Diagnosis not present

## 2020-05-25 DIAGNOSIS — I10 Essential (primary) hypertension: Secondary | ICD-10-CM | POA: Diagnosis not present

## 2020-05-25 DIAGNOSIS — I610 Nontraumatic intracerebral hemorrhage in hemisphere, subcortical: Secondary | ICD-10-CM | POA: Diagnosis not present

## 2020-05-25 DIAGNOSIS — E782 Mixed hyperlipidemia: Secondary | ICD-10-CM | POA: Diagnosis not present

## 2020-05-25 DIAGNOSIS — J449 Chronic obstructive pulmonary disease, unspecified: Secondary | ICD-10-CM | POA: Diagnosis not present

## 2020-05-25 DIAGNOSIS — M1812 Unilateral primary osteoarthritis of first carpometacarpal joint, left hand: Secondary | ICD-10-CM | POA: Diagnosis not present

## 2020-05-25 LAB — COMPREHENSIVE METABOLIC PANEL
ALT: 28 U/L (ref 0–44)
AST: 20 U/L (ref 15–41)
Albumin: 3.4 g/dL — ABNORMAL LOW (ref 3.5–5.0)
Alkaline Phosphatase: 85 U/L (ref 38–126)
Anion gap: 10 (ref 5–15)
BUN: 12 mg/dL (ref 8–23)
CO2: 23 mmol/L (ref 22–32)
Calcium: 9.5 mg/dL (ref 8.9–10.3)
Chloride: 110 mmol/L (ref 98–111)
Creatinine, Ser: 0.92 mg/dL (ref 0.44–1.00)
GFR calc Af Amer: >60 mL/min (ref >60)
GFR calc non Af Amer: >60 mL/min (ref >60)
Glucose, Bld: 124 mg/dL — ABNORMAL HIGH (ref 70–99)
Potassium: 3.7 mmol/L (ref 3.5–5.1)
Sodium: 143 mmol/L (ref 135–145)
Total Bilirubin: 0.4 mg/dL (ref 0.3–1.2)
Total Protein: 6.9 g/dL (ref 6.5–8.1)

## 2020-05-25 LAB — CBC WITH DIFFERENTIAL/PLATELET
Abs Immature Granulocytes: 0.02 10*3/uL (ref 0.00–0.07)
Basophils Absolute: 0 10*3/uL (ref 0.0–0.1)
Basophils Relative: 0 %
Eosinophils Absolute: 0 10*3/uL (ref 0.0–0.5)
Eosinophils Relative: 0 %
HCT: 24.7 % — ABNORMAL LOW (ref 36.0–46.0)
Hemoglobin: 8.1 g/dL — ABNORMAL LOW (ref 12.0–15.0)
Immature Granulocytes: 1 %
Lymphocytes Relative: 34 %
Lymphs Abs: 1 10*3/uL (ref 0.7–4.0)
MCH: 35.8 pg — ABNORMAL HIGH (ref 26.0–34.0)
MCHC: 32.8 g/dL (ref 30.0–36.0)
MCV: 109.3 fL — ABNORMAL HIGH (ref 80.0–100.0)
Monocytes Absolute: 0.5 10*3/uL (ref 0.1–1.0)
Monocytes Relative: 16 %
Neutro Abs: 1.5 10*3/uL — ABNORMAL LOW (ref 1.7–7.7)
Neutrophils Relative %: 49 %
Platelets: 167 10*3/uL (ref 150–400)
RBC: 2.26 MIL/uL — ABNORMAL LOW (ref 3.87–5.11)
RDW: 18.7 % — ABNORMAL HIGH (ref 11.5–15.5)
WBC: 3.1 10*3/uL — ABNORMAL LOW (ref 4.0–10.5)
nRBC: 0 % (ref 0.0–0.2)

## 2020-05-25 LAB — IRON AND TIBC
Iron: 80 ug/dL (ref 41–142)
Saturation Ratios: 35 % (ref 21–57)
TIBC: 228 ug/dL — ABNORMAL LOW (ref 236–444)
UIBC: 148 ug/dL (ref 120–384)

## 2020-05-25 LAB — RETICULOCYTES
Immature Retic Fract: 17.9 % — ABNORMAL HIGH (ref 2.3–15.9)
RBC.: 2.26 MIL/uL — ABNORMAL LOW (ref 3.87–5.11)
Retic Count, Absolute: 42.7 10*3/uL (ref 19.0–186.0)
Retic Ct Pct: 1.9 % (ref 0.4–3.1)

## 2020-05-25 LAB — FERRITIN: Ferritin: 258 ng/mL (ref 11–307)

## 2020-06-01 DIAGNOSIS — R1312 Dysphagia, oropharyngeal phase: Secondary | ICD-10-CM | POA: Diagnosis not present

## 2020-06-01 DIAGNOSIS — I69318 Other symptoms and signs involving cognitive functions following cerebral infarction: Secondary | ICD-10-CM | POA: Diagnosis not present

## 2020-06-01 DIAGNOSIS — D45 Polycythemia vera: Secondary | ICD-10-CM | POA: Diagnosis not present

## 2020-06-01 DIAGNOSIS — I69398 Other sequelae of cerebral infarction: Secondary | ICD-10-CM | POA: Diagnosis not present

## 2020-06-01 DIAGNOSIS — I10 Essential (primary) hypertension: Secondary | ICD-10-CM | POA: Diagnosis not present

## 2020-06-01 DIAGNOSIS — I4892 Unspecified atrial flutter: Secondary | ICD-10-CM | POA: Diagnosis not present

## 2020-06-01 DIAGNOSIS — R69 Illness, unspecified: Secondary | ICD-10-CM | POA: Diagnosis not present

## 2020-06-01 DIAGNOSIS — I480 Paroxysmal atrial fibrillation: Secondary | ICD-10-CM | POA: Diagnosis not present

## 2020-06-01 DIAGNOSIS — Z7409 Other reduced mobility: Secondary | ICD-10-CM | POA: Diagnosis not present

## 2020-06-08 ENCOUNTER — Ambulatory Visit: Payer: Medicare HMO | Attending: Physical Medicine & Rehabilitation

## 2020-06-08 ENCOUNTER — Other Ambulatory Visit: Payer: Self-pay

## 2020-06-08 DIAGNOSIS — R41841 Cognitive communication deficit: Secondary | ICD-10-CM | POA: Insufficient documentation

## 2020-06-08 DIAGNOSIS — R4701 Aphasia: Secondary | ICD-10-CM

## 2020-06-08 NOTE — Therapy (Signed)
[facility] [address] [place], [place], [address] Phone: [phone]   Fax:  [phone]  Speech Language Pathology Evaluation  Patient Details  Name: [name] MRN: [id number] Date of Birth: December 09, 1945 Referring Provider (SLP): Alysia Penna MD   Encounter Date: 06/08/2020   End of Session - 06/08/20 1641    Visit Number 1    Number of Visits 17    Date for SLP Re-Evaluation 08/30/20   90 days   SLP Start Time 7353    SLP Stop Time  1446    SLP Time Calculation (min) 41 min    Activity Tolerance Patient tolerated treatment well           Past Medical History:  Diagnosis Date  . Hepatitis C   . Hyperlipidemia   . Hypertension   . Osteoporosis   . Stroke ([place])   . Thrombocytosis ([place])     Past Surgical History:  Procedure Laterality Date  . IR ANGIO INTRA EXTRACRAN SEL INTERNAL CAROTID BILAT MOD SED  02/24/2020  . IR ANGIO VERTEBRAL SEL VERTEBRAL UNI R MOD SED  02/24/2020  . IR ANGIOGRAM SELECTIVE EACH ADDITIONAL VESSEL  02/24/2020  . IR IVC FILTER PLMT / S&I /IMG GUID/MOD SED  02/22/2020  . IR RADIOLOGIST EVAL & MGMT  05/23/2020  . IR THROMBECT VENO MECH MOD SED  02/24/2020  . IR US GUIDE VASC ACCESS RIGHT  02/24/2020  . IR US GUIDE VASC ACCESS RIGHT  02/24/2020  . IR VENO SAGITTAL SINUS  02/24/2020  . IR VENO/JUGULAR RIGHT  02/24/2020  . RADIOLOGY WITH ANESTHESIA N/A 02/24/2020   Procedure: IR WITH ANESTHESIA;  Surgeon: Radiologist, Medication, MD;  Location: [facility];  Service: Radiology;  Laterality: N/A;    There were no vitals filed for this visit.   Subjective Assessment - 06/08/20 1438    Subjective "Glendell Docker even before this she was having trouble coming up with  the names, not as much but she was some."    Patient is accompained by: Family member   [name]   Currently in Pain? No/denies              SLP Evaluation [facility] - 06/08/20 1438      SLP Visit Information   SLP Received On  06/08/20    Referring Provider (SLP) Alysia Penna MD    Onset Date 02-13-20    Medical Diagnosis lt temporoccipital ICH      Subjective   Patient/Family Stated Goal I would like to think of the words better.      General Information   HPI Pt with lt termporoccipital ICH on 02-13-20. Was d/c'd from [facility] 03-29-20, and [facility] was d/c'd 06-01-20.       Prior Functional Status   Cognitive/Linguistic Baseline Within functional limits   husband states pt having some trouble with anomia premorbidl   Type of Home House     Lives With Spouse      Cognition   Overall Cognitive Status Impaired/Different from baseline    Area of Impairment Memory;Awareness   further testing to be completed   Memory Comments Pt and husband report pt has short term memory deficits post CVA    Awareness Intellectual   impaired   Awareness Comments pt reported difficulty reading as what was difficult with thinking and talking post CVA      Auditory Comprehension   Overall Auditory Comprehension Appears within functional limits for tasks assessed  Verbal Expression   Overall Verbal Expression Impaired    Naming Impairment    Other Verbal Expression Comments Pt with some vague answers with mod complex responses.      Oral Motor/Sensory Function   Overall Oral Motor/Sensory Function Appears within functional limits for tasks assessed      Motor Speech   Overall Motor Speech Appears within functional limits for tasks assessed      Standardized Assessments   Standardized Assessments  [place] Naming Test-2nd edition    [place] Naming Test-2nd edition  29/60 - more than 2 standard deviations below the mean                           SLP Education - 06/08/20 1640    Education Details eval results, therapy course, possible goals    Person(s) Educated Patient;Spouse    Methods Explanation    Comprehension Verbalized understanding            SLP Short Term Goals - 06/08/20 1645      SLP SHORT  TERM GOAL #1   Title pt will complete simple-mod complex naming tasks with 80% success x2 sessions    Time 4    Period Weeks   or 9 total sessions, for all STGs   Status New      SLP SHORT TERM GOAL #2   Title pt will complete any further testing for ST goal writing in the first 4 sessions    Time 2    Period Weeks   or 5 total sessions   Status New      SLP SHORT TERM GOAL #3   Title pt will bring a memory notebook/binder/planner to 4 therapy sessions    Time 4    Period Weeks    Status New            SLP Long Term Goals - 06/08/20 1648      SLP LONG TERM GOAL #1   Title pt will demo appropriate use of her binder/planner in 4 sessions    Time 8    Period Weeks   or 17 total sessions, for all LTGs   Status New      SLP LONG TERM GOAL #2   Title pt will use anomic compensations for 10 minutes functional communication in mod complex conversation in 3 sessions    Time 8    Period Weeks    Status New            Plan - 06/08/20 1641    Clinical Impression Statement Pt presents today with anomia, reported alexia although pt read single words without difficulty on [employer], and pt/husband reported some mild deficits in cognitive linguistic areas such as memory and likely attention. Pt scored below two standard deviations below the mean with naming on the [employer] (29/60). Phonemic cues were most helpful and pt noted to use semantic self-cues to assist her recall of assessment lexicon. and reports frustration with conversation on a daily basis. She is unable to indpenedently take her meds and requires consistent cueing from husband to do so safely. Skilled ST is recommended to improve pt's cognitive linguisitic ability and improve pt to as close to her PLOF as possible.    Speech Therapy Frequency 2x / week    Duration --   8 weeks or 17 total sessions   Treatment/Interventions Environmental controls;Functional tasks;Compensatory techniques;SLP instruction  and feedback;Cueing hierarchy;Language facilitation;Cognitive reorganization;Compensatory  strategies;Internal/external aids;Patient/family education    Potential to Achieve Goals Good    Potential Considerations Severity of impairments    Consulted and Agree with Plan of Care Patient           Patient will benefit from skilled therapeutic intervention in order to improve the following deficits and impairments:   Aphasia  Cognitive communication deficit    Problem List Patient Active Problem List   Diagnosis Date Noted  . Cerebral venous sinus thrombosis 03/30/2020  . Presence of IVC filter   . Benign essential HTN   . Peripheral edema   . Impulsive   . Hypoalbuminemia due to protein-calorie malnutrition ([place])   . Anemia   . Prediabetes   . Steroid-induced hyperglycemia   . Embolic stroke ([place]) 44/46/1901  . Dysphagia   . Vascular headache   . Thrombocytosis ([place])   . Venous thrombosis   . Polycythemia   . Encounter for central line care   . Nontraumatic subcortical hemorrhage of right cerebral hemisphere ([place])   . Abnormal CT of the chest   . Atrial flutter with rapid ventricular response ([place])   . Pulmonary embolus ([place]) 02/22/2020  . Polycythemia vera ([place]) 02/17/2020  . Likely Cerebral amyloid angiopathy ([place]) 02/17/2020  . IVH (intraventricular hemorrhage) ([place]) 02/14/2020  . TIA (transient ischemic attack) 08/17/2014  . Acute sinusitis 08/16/2014  . Headache 08/15/2014  . Hypothyroidism 08/15/2014  . Hepatitis C   . Hyperlipidemia   . Hypertension     [place] ,[place], [place]  06/08/2020, 4:56 PM  [facility] [address] [place], [place], [address] Phone: [phone]   Fax:  [phone]  Name: MARELYN ROUSER MRN: [id number] Date of Birth: July 11, 1946

## 2020-06-08 NOTE — Patient Instructions (Signed)
We will see you for helping you with your word finding. As we get to know each other we may also assess other areas and provide you with help with anything needed with talking or thinking.   Please complete the assigned speech therapy homework prior to your next session and return it to the speech therapist at your next visit.

## 2020-06-14 NOTE — Progress Notes (Signed)
x

## 2020-06-15 ENCOUNTER — Other Ambulatory Visit: Payer: Self-pay | Admitting: Oncology

## 2020-06-15 ENCOUNTER — Inpatient Hospital Stay: Payer: Medicare HMO | Attending: Oncology

## 2020-06-15 ENCOUNTER — Other Ambulatory Visit: Payer: Self-pay

## 2020-06-15 ENCOUNTER — Inpatient Hospital Stay: Payer: Medicare HMO | Admitting: Oncology

## 2020-06-15 VITALS — BP 136/61 | HR 82 | Temp 98.5°F | Resp 18 | Ht 63.0 in | Wt 140.0 lb

## 2020-06-15 DIAGNOSIS — D75839 Thrombocytosis, unspecified: Secondary | ICD-10-CM

## 2020-06-15 DIAGNOSIS — I615 Nontraumatic intracerebral hemorrhage, intraventricular: Secondary | ICD-10-CM | POA: Diagnosis not present

## 2020-06-15 DIAGNOSIS — D473 Essential (hemorrhagic) thrombocythemia: Secondary | ICD-10-CM

## 2020-06-15 DIAGNOSIS — D45 Polycythemia vera: Secondary | ICD-10-CM

## 2020-06-15 DIAGNOSIS — Z79899 Other long term (current) drug therapy: Secondary | ICD-10-CM | POA: Diagnosis not present

## 2020-06-15 DIAGNOSIS — Z7901 Long term (current) use of anticoagulants: Secondary | ICD-10-CM | POA: Diagnosis not present

## 2020-06-15 DIAGNOSIS — I2699 Other pulmonary embolism without acute cor pulmonale: Secondary | ICD-10-CM | POA: Diagnosis not present

## 2020-06-15 DIAGNOSIS — G459 Transient cerebral ischemic attack, unspecified: Secondary | ICD-10-CM | POA: Diagnosis not present

## 2020-06-15 DIAGNOSIS — E538 Deficiency of other specified B group vitamins: Secondary | ICD-10-CM | POA: Diagnosis not present

## 2020-06-15 DIAGNOSIS — D7582 Heparin induced thrombocytopenia (HIT): Secondary | ICD-10-CM | POA: Insufficient documentation

## 2020-06-15 DIAGNOSIS — I4891 Unspecified atrial fibrillation: Secondary | ICD-10-CM | POA: Insufficient documentation

## 2020-06-15 DIAGNOSIS — D471 Chronic myeloproliferative disease: Secondary | ICD-10-CM | POA: Insufficient documentation

## 2020-06-15 LAB — COMPREHENSIVE METABOLIC PANEL
ALT: 17 U/L (ref 0–44)
AST: 19 U/L (ref 15–41)
Albumin: 3.9 g/dL (ref 3.5–5.0)
Alkaline Phosphatase: 78 U/L (ref 38–126)
Anion gap: 9 (ref 5–15)
BUN: 9 mg/dL (ref 8–23)
CO2: 25 mmol/L (ref 22–32)
Calcium: 9.9 mg/dL (ref 8.9–10.3)
Chloride: 109 mmol/L (ref 98–111)
Creatinine, Ser: 0.9 mg/dL (ref 0.44–1.00)
GFR calc Af Amer: >60 mL/min (ref >60)
GFR calc non Af Amer: >60 mL/min (ref >60)
Glucose, Bld: 112 mg/dL — ABNORMAL HIGH (ref 70–99)
Potassium: 3.4 mmol/L — ABNORMAL LOW (ref 3.5–5.1)
Sodium: 143 mmol/L (ref 135–145)
Total Bilirubin: 0.7 mg/dL (ref 0.3–1.2)
Total Protein: 7 g/dL (ref 6.5–8.1)

## 2020-06-15 LAB — RETICULOCYTES
Immature Retic Fract: 16 % — ABNORMAL HIGH (ref 2.3–15.9)
RBC.: 2.68 MIL/uL — ABNORMAL LOW (ref 3.87–5.11)
Retic Count, Absolute: 77.7 10*3/uL (ref 19.0–186.0)
Retic Ct Pct: 2.9 % (ref 0.4–3.1)

## 2020-06-15 LAB — CBC WITH DIFFERENTIAL/PLATELET
Abs Immature Granulocytes: 0 10*3/uL (ref 0.00–0.07)
Basophils Absolute: 0 10*3/uL (ref 0.0–0.1)
Basophils Relative: 1 %
Eosinophils Absolute: 0 10*3/uL (ref 0.0–0.5)
Eosinophils Relative: 1 %
HCT: 29.1 % — ABNORMAL LOW (ref 36.0–46.0)
Hemoglobin: 9.3 g/dL — ABNORMAL LOW (ref 12.0–15.0)
Immature Granulocytes: 0 %
Lymphocytes Relative: 34 %
Lymphs Abs: 1.2 10*3/uL (ref 0.7–4.0)
MCH: 34.4 pg — ABNORMAL HIGH (ref 26.0–34.0)
MCHC: 32 g/dL (ref 30.0–36.0)
MCV: 107.8 fL — ABNORMAL HIGH (ref 80.0–100.0)
Monocytes Absolute: 0.4 10*3/uL (ref 0.1–1.0)
Monocytes Relative: 10 %
Neutro Abs: 1.9 10*3/uL (ref 1.7–7.7)
Neutrophils Relative %: 54 %
Platelets: 243 10*3/uL (ref 150–400)
RBC: 2.7 MIL/uL — ABNORMAL LOW (ref 3.87–5.11)
RDW: 16.5 % — ABNORMAL HIGH (ref 11.5–15.5)
WBC: 3.5 10*3/uL — ABNORMAL LOW (ref 4.0–10.5)
nRBC: 0 % (ref 0.0–0.2)

## 2020-06-15 LAB — IRON AND TIBC
Iron: 60 ug/dL (ref 41–142)
Saturation Ratios: 24 % (ref 21–57)
TIBC: 253 ug/dL (ref 236–444)
UIBC: 192 ug/dL (ref 120–384)

## 2020-06-15 LAB — FERRITIN: Ferritin: 111 ng/mL (ref 11–307)

## 2020-06-15 MED ORDER — HYDROXYUREA 500 MG PO CAPS: 500.0000 mg | ORAL_CAPSULE | Freq: Every day | ORAL | 6 refills | Status: DC

## 2020-06-15 NOTE — Progress Notes (Signed)
[name]  [phone] [phone]     ID: DELAYZA LUNGREN DOB: 1946-10-14  MR#: [id number]  PZW#:[id number]  Patient Care Team: Cari Caraway, MD as PCP - General (Family Medicine) Lorretta Harp, MD as PCP - Cardiology (Cardiology) Chauncey Cruel, MD OTHER MD:  CHIEF COMPLAINT: Polycythemia vera/essential thrombocytosis, HIT, coagulopathy, anemia  CURRENT TREATMENT: Hydroxyurea, rivaroxaban   INTERVAL HISTORY: Ranyia returns today for follow up of her JAK2 positive proliferative neoplasm, which we initially diagnosed as polycythemia vera but which is behaving more as essential thrombocytosis.  She is accompanied by her husband [name].    She was started on hydroxyurea during her recent hospitalization at 500 mg a day.  She did well with that but when we tried to increase the dose she had significant cytopenias.  Her Hydrea was stopped.  She is now on 500 mg a day again and tolerating that well.    She also has extensive thrombosis felt to be related to HIT.  She is on rivaroxaban standard dose for that. She has an IVC filter in place.  The recommendation from interventional radiology given her very extensive IVC clot is to wait until July to reassess and consider removing it at that time. She underwent venogram on 05/19/2020 showing: significant interval improvement in clot burden; residual nonocclusive thrombus within right common and external iliac veins, as well as a small thrombus at cone of IVC filter.   She met with Dr. Anselm Pancoast in interventional radiology, who recommended repeat venogram and filter retrieval in 4-6 weeks from 05/23/2020.  This has not yet been scheduled.   REVIEW OF SYSTEMS: Arlanda continues to slowly recover.  She is not back to baseline on a cognitive point of view but she was able to crack jokes today which is certainly a very positive thing.  She has short memory problems.  She has no unusual headaches no visual changes  no staggering or falling, and generally with her husband's assistant is walking almost a mile a day, doing some housework, and doing some shopping.  She is tolerating the rivaroxaban well with no bleeding or bruising.  She is still followed by neurology for this.  A detailed review of systems today was otherwise stable.   HISTORY OF CURRENT ILLNESS: From the original consult note:  SHAELYNN DRAGOS has a history of hypertension followed by Dr. Theadore Nan.  For the last few days she has had headache and she took her blood pressure and it was as high as 168/102 which is unusual for her.  As this was not improving she presented to the emergency room last night 02/13/2020. Dr Roxanne Mins obtained a basic lab work and a head CT.  The head CT without contrast showed an acute intraventricular hemorrhage in the right lateral ventricle, with no parenchymal component.  The lab work showed a white cell count of 12.0, hemoglobin 17.7, and platelets 990,000.  We were consulted for further evaluation and treatment.  The patient's subsequent history is as detailed below.   PAST MEDICAL HISTORY: Past Medical History:  Diagnosis Date  . Hepatitis C   . Hyperlipidemia   . Hypertension   . Osteoporosis   . Stroke ([place])   . Thrombocytosis ([place])     PAST SURGICAL HISTORY: Past Surgical History:  Procedure Laterality Date  . IR ANGIO INTRA EXTRACRAN SEL INTERNAL CAROTID BILAT MOD SED  02/24/2020  . IR ANGIO VERTEBRAL SEL VERTEBRAL UNI R MOD SED  02/24/2020  . IR  ANGIOGRAM SELECTIVE EACH ADDITIONAL VESSEL  02/24/2020  . IR IVC FILTER PLMT / S&I /IMG GUID/MOD SED  02/22/2020  . IR RADIOLOGIST EVAL & MGMT  05/23/2020  . IR THROMBECT VENO MECH MOD SED  02/24/2020  . IR US GUIDE VASC ACCESS RIGHT  02/24/2020  . IR US GUIDE VASC ACCESS RIGHT  02/24/2020  . IR VENO SAGITTAL SINUS  02/24/2020  . IR VENO/JUGULAR RIGHT  02/24/2020  . RADIOLOGY WITH ANESTHESIA N/A 02/24/2020   Procedure: IR WITH ANESTHESIA;  Surgeon:  Radiologist, Medication, MD;  Location: [facility];  Service: Radiology;  Laterality: N/A;    FAMILY HISTORY: Family History  Problem Relation Age of Onset  . Heart disease Mother   . Heart disease Father    The patient's father died at age 22 and the patient's mother at age 84 both from heart disease.  The patient has 1 sister, no brothers.  There is no family history of blood problems and no family history of cancer to the patient's knowledge   GYNECOLOGIC HISTORY:  No LMP recorded. Patient is postmenopausal. Menarche: 74 years old Age at first live birth: 74 years old [place] P 2 LMP 53 HRT yes, a few years Hysterectomy?  Salpingo-oophorectomy?   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  Her husband [name] ran a business but is now retired.  Their son [name] is a Adult nurse.  Their son Aaron Edelman is an Pharmacologist.  The patient has 1 grandson and 4 step grandchildren.  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    ADVANCED DIRECTIVES: In the absence of any documentation to the contrary, the patient's spouse is their HCPOA.    HEALTH MAINTENANCE: Social History   Tobacco Use  . Smoking status: Former Research scientist (life sciences)  . Smokeless tobacco: Never Used  Vaping Use  . Vaping Use: Never used  Substance Use Topics  . Alcohol use: No  . Drug use: No     Colonoscopy: [facility]  PAP: [place]  Bone density:    Allergies  Allergen Reactions  . Heparin Other (See Comments)    4/15 HIT OD 2.553, SRA negative > treated as true HIT   . Amoxicillin-Pot Clavulanate Other (See Comments)  . Erythromycin Itching and Other (See Comments)    Severe stomach pains, diarrhea    Current Outpatient Medications  Medication Sig Dispense Refill  . amLODipine (NORVASC) 10 MG tablet Take 0.5 tablets (5 mg total) by mouth daily. 30 tablet 0  . atorvastatin (LIPITOR) 10 MG tablet Take 1 tablet (10 mg total) by mouth daily. (Patient taking differently: Take 10 mg by mouth every  evening. ) 30 tablet 0  . fluticasone furoate-vilanterol (BREO ELLIPTA) 200-25 MCG/INH AEPB Inhale 1 puff into the lungs daily. 60 each 0  . hydroxyurea (HYDREA) 500 MG capsule Take 3 capsules (1,500 mg total) by mouth daily. May take with food to minimize GI side effects. 120 capsule 6  . levothyroxine (SYNTHROID) 125 MCG tablet Take 1 tablet (125 mcg total) by mouth daily before breakfast. 30 tablet 0  . losartan (COZAAR) 50 MG tablet Take 1 tablet (50 mg total) by mouth 2 (two) times daily. 60 tablet 0  . pantoprazole (PROTONIX) 40 MG tablet Take 1 tablet (40 mg total) by mouth daily. 30 tablet 0  . QUEtiapine (SEROQUEL) 25 MG tablet Take 1 tablet (25 mg total) by mouth at bedtime as needed (for agitation). (Patient not taking: Reported on 05/12/2020) 15 tablet 0  . rivaroxaban (  XARELTO) 20 MG TABS tablet Take 1 tablet (20 mg total) by mouth daily with breakfast. 30 tablet 0  . senna-docusate (SENOKOT-S) 8.6-50 MG tablet Take 2 tablets by mouth 2 (two) times daily. (Patient taking differently: Take 1 tablet by mouth daily. ) 120 tablet 0  . umeclidinium bromide (INCRUSE ELLIPTA) 62.5 MCG/INH AEPB Inhale 1 puff into the lungs daily. 30 each 0   No current facility-administered medications for this visit.    OBJECTIVE: White woman who appears stated age 77:   06/15/20 1205  BP: 136/61  Pulse: 82  Resp: 18  Temp: 98.5 F (36.9 C)  SpO2: 100%     Body mass index is 24.8 kg/m.   Wt Readings from Last 3 Encounters:  06/15/20 140 lb (63.5 kg)  05/19/20 140 lb (63.5 kg)  05/12/20 141 lb (64 kg)      ECOG FS:1 - Symptomatic but completely ambulatory  Sclerae unicteric, EOMs intact Wearing a mask No cervical or supraclavicular adenopathy Lungs no rales or rhonchi Heart regular rate and rhythm Abd soft, nontender, positive bowel sounds MSK no focal spinal tenderness, no upper extremity lymphedema Neuro: nonfocal, appropriate affect Breasts: Deferred   LAB RESULTS:  CMP       Component Value Date/Time   NA 143 06/15/2020 1137   K 3.4 (L) 06/15/2020 1137   CL 109 06/15/2020 1137   CO2 25 06/15/2020 1137   GLUCOSE 112 (H) 06/15/2020 1137   BUN 9 06/15/2020 1137   CREATININE 0.90 06/15/2020 1137   CALCIUM 9.9 06/15/2020 1137   PROT 7.0 06/15/2020 1137   ALBUMIN 3.9 06/15/2020 1137   AST 19 06/15/2020 1137   ALT 17 06/15/2020 1137   ALKPHOS 78 06/15/2020 1137   BILITOT 0.7 06/15/2020 1137   GFRNONAA >60 06/15/2020 1137   GFRAA >60 06/15/2020 1137    No results found for: TOTALPROTELP, ALBUMINELP, A1GS, A2GS, BETS, BETA2SER, GAMS, MSPIKE, SPEI  Lab Results  Component Value Date   WBC 3.5 (L) 06/15/2020   NEUTROABS 1.9 06/15/2020   HGB 9.3 (L) 06/15/2020   HCT 29.1 (L) 06/15/2020   MCV 107.8 (H) 06/15/2020   PLT 243 06/15/2020    No results found for: LABCA2  No components found for: SAYTKZ601  No results for input(s): INR in the last 168 hours.  No results found for: LABCA2  No results found for: UXN235  No results found for: TDD220  No results found for: URK270  No results found for: CA2729  No components found for: HGQUANT  No results found for: CEA1 / No results found for: CEA1   No results found for: AFPTUMOR  No results found for: CHROMOGRNA  No results found for: KPAFRELGTCHN, LAMBDASER, KAPLAMBRATIO (kappa/lambda light chains)  No results found for: HGBA, HGBA2QUANT, HGBFQUANT, HGBSQUAN (Hemoglobinopathy evaluation)   Lab Results  Component Value Date   LDH 410 (H) 03/10/2020    Lab Results  Component Value Date   IRON 60 06/15/2020   TIBC 253 06/15/2020   IRONPCTSAT 24 06/15/2020   (Iron and TIBC)  Lab Results  Component Value Date   FERRITIN 111 06/15/2020    Urinalysis    Component Value Date/Time   COLORURINE YELLOW 05/19/2020 [place] 05/19/2020 1554   LABSPEC 1.045 (H) 05/19/2020 1554   PHURINE 6.0 05/19/2020 [place] 05/19/2020 [place]  05/19/2020 [place] 05/19/2020 [place] 05/19/2020 [place] 05/19/2020 1554  UROBILINOGEN 0.2 08/15/2014 2129   NITRITE POSITIVE (A) 05/19/2020 1554   LEUKOCYTESUR NEGATIVE 05/19/2020 1554     STUDIES: CT VENOGRAM ABD/PEL  Result Date: 05/19/2020 CLINICAL DATA:  DVT. To assess clot burden prior to removal of the caval filter. EXAM: CT VENOGRAM OF THE ABDOMEN AND PELVIS TECHNIQUE: Multidetector CT imaging of the abdomen and pelvis was performed using the standard protocol during bolus administration of intravenous contrast. Multiplanar CT image reconstructions and MIPs were obtained to evaluate the vascular anatomy. CONTRAST:  161m OMNIPAQUE IOHEXOL 350 MG/ML SOLN COMPARISON:  CT dated 03/03/2020 FINDINGS: Lower chest: There is presumed atelectasis at the lung bases.The heart is enlarged. Hepatobiliary: The liver is normal. Status post cholecystectomy.There is no biliary ductal dilation. Pancreas: Normal contours without ductal dilatation. No peripancreatic fluid collection. Spleen: Unremarkable. Adrenals/Urinary Tract: --Adrenal glands: Unremarkable. --Right kidney/ureter: No hydronephrosis or radiopaque kidney stones. --Left kidney/ureter: There is a stable left renal cyst. --Urinary bladder: Unremarkable. Stomach/Bowel: --Stomach/Duodenum: No hiatal hernia or other gastric abnormality. Normal duodenal course and caliber. --Small bowel: Unremarkable. --Colon: Rectosigmoid diverticulosis without acute inflammation. --Appendix: Normal. Vascular/Lymphatic: Atherosclerotic calcification is present within the non-aneurysmal abdominal aorta, without hemodynamically significant stenosis. There is a well-positioned IVC filter in place. The previously demonstrated caval thrombus appears to resolved. There is a probable small amount of residual clot within the colon of the IVC filter (axial series 2, image 37). There appears to be residual nonocclusive  thrombus within the right common iliac vein (axial series 5, image 54) and the right external iliac vein (axial series 5, image 63). --No retroperitoneal lymphadenopathy. --No mesenteric lymphadenopathy. --No pelvic or inguinal lymphadenopathy. Reproductive: Unremarkable Other: No ascites or free air. The abdominal wall is normal. Musculoskeletal. There is a chronic compression fracture of the T10 vertebral body. There is no evidence for an acute displaced fracture on this study. IMPRESSION: 1. Significant interval improvement in clot burden. There is residual nonocclusive thrombus within the right common and external iliac veins as well as a small thrombus at the cone of the IVC filter. 2. Well-positioned IVC filter, well centered within the cava. 3. Additional chronic incidental findings as detailed above. Aortic Atherosclerosis (ICD10-I70.0). Electronically Signed   By: CConstance HolsterM.D.   On: 05/19/2020 14:47   IR Radiologist Eval & Mgmt  Result Date: 05/23/2020 Please refer to notes tab for details about interventional procedure. (Op Note)    ELIGIBLE FOR AVAILABLE RESEARCH PROTOCOL: no  ASSESSMENT: 74y.o. [place] presenting 02/13/2020 with headache, found to have biventricular bleeds in the setting of panmyelosis, subsequently with pulmonary emboli and sinus venous thromboses in the setting of heparin induced thrombocytopenia  (1) myeloproliferative neoplasm: (polycythemia vera?) / essential thrombocytosis             (a) JAK2 mutation positive confirming diagnosis             (b) bone marrow biopsy 02/13/2020 shows no evidence of leukemia, c/w P Vera             (c)  leukapheresis x3 . Last leukapheresis Friday 02/18/2020                         (i) HD catheter removed after pheresis on 02/18/2020             (d) hydrea 500 mg daily to keep HCT <45 and platelets in normal range  (e) Hydrea dose increased to 1000 mg daily Apr 07, 2020--not tolerated  (f) Hydrea resumed  at  500 mg daily June 2021              (2) heparin induced thrombocytopenia: positive HIT screen x2             (a) severe thrombocythemia noted 02/24/2020             (b) bilateral pulmonary emboli 02/22/2020                         (i) s/p IVC filter placement 02/22/2020                         (ii) atrial fibrillation with RVR             (c) venous sinus thromboses 02/24/2020                         (i) s/p mechanical thrombectomy             (d) argatroban started 02/25/2020             (e) platelet count responded to heparin withdrawal             (f) confirmatory serotonin release assay negative (see discussion below)                         (i) continue argatroban for anticoagulation, no heparin                         (ii) repeat HIT screen at [facility] lab again strongly positive             (g) platelets normalized as of 03/08/2020  (3) drug-induced thrombocytopenia             (a) discontinued amiodarone, inessential meds                         (i) remains in sinus             (b) resolved  (4) anemia: reticulocytes inappropriately normal; no folate, B-12 or iron deficiency; normal renal function; LDH slightly elevated but negative DAT and normal t bil (no evidence of hemolysis)--consider occult bleeding, medication effect, or marrow dysfunction   PLAN: Bayler is tolerating the Hydrea at 500 mg quite well and right now her counts are very good.  White cells and red cells are rising.  Platelets are remaining stable.  We may have to accept a slightly high platelet count, in the 5 or 600,000 range, for the sake of the other labs.  Quite aside from this as she is hoping to get her IVC filter removed this month.  I am copying Dr. Anselm Pancoast on today's note.  He will be scheduling her for repeat venogram and eventual removal  Otherwise she is followed closely by neurology for her other issues.  We are going to check her counts monthly and she will see me 3 months from now.  If  everything is stable we will broaden the follow-up interval  Total encounter time 25 minutes.  Virgie Dad. York Valliant, MD 06/15/2020 1:39 PM Medical Oncology and Hematology [facility] [place], [place] [address] Tel. [phone]    Fax. [phone]   This document serves as a record of services personally performed by Lurline Del, MD. It was created on his  behalf by Wilburn Mylar, a trained medical scribe. The creation of this record is based on the scribe's personal observations and the provider's statements to them.   I, Lurline Del MD, have reviewed the above documentation for accuracy and completeness, and I agree with the above.   *Total Encounter Time as defined by the Centers for Medicare and Medicaid Services includes, in addition to the face-to-face time of a patient visit (documented in the note above) non-face-to-face time: obtaining and reviewing outside history, ordering and reviewing medications, tests or procedures, care coordination (communications with other health care professionals or caregivers) and documentation in the medical record.

## 2020-06-16 ENCOUNTER — Telehealth: Payer: Self-pay | Admitting: Oncology

## 2020-06-16 NOTE — Telephone Encounter (Signed)
Scheduled appts per 8/5 los. Pt's spouse confirmed appt date and time.

## 2020-06-19 ENCOUNTER — Telehealth (HOSPITAL_COMMUNITY): Payer: Self-pay

## 2020-06-19 ENCOUNTER — Other Ambulatory Visit (HOSPITAL_COMMUNITY): Payer: Self-pay | Admitting: Diagnostic Radiology

## 2020-06-19 ENCOUNTER — Other Ambulatory Visit: Payer: Self-pay | Admitting: Diagnostic Radiology

## 2020-06-19 DIAGNOSIS — Z95828 Presence of other vascular implants and grafts: Secondary | ICD-10-CM

## 2020-06-19 NOTE — Telephone Encounter (Signed)
Called to schedule ivc filter retrieval, no answer, left vm. AW 

## 2020-06-21 ENCOUNTER — Ambulatory Visit (HOSPITAL_COMMUNITY): Payer: Medicare HMO

## 2020-06-22 ENCOUNTER — Other Ambulatory Visit: Payer: Self-pay

## 2020-06-22 ENCOUNTER — Ambulatory Visit: Payer: Medicare HMO | Attending: Physical Medicine & Rehabilitation

## 2020-06-22 DIAGNOSIS — R1311 Dysphagia, oral phase: Secondary | ICD-10-CM | POA: Diagnosis not present

## 2020-06-22 DIAGNOSIS — R4701 Aphasia: Secondary | ICD-10-CM

## 2020-06-22 DIAGNOSIS — R41841 Cognitive communication deficit: Secondary | ICD-10-CM | POA: Diagnosis not present

## 2020-06-22 NOTE — Therapy (Signed)
[facility] [address] [place], [place], [address] Phone: [phone]   Fax:  [phone]  Speech Language Pathology Treatment  Patient Details  Name: [name] MRN: [id number] Date of Birth: 09-05-1946 Referring Provider (SLP): Alysia Penna MD   Encounter Date: 06/22/2020   End of Session - 06/22/20 1438    Visit Number 2    Number of Visits 17    Date for SLP Re-Evaluation 08/30/20   90 days   SLP Start Time 1319    SLP Stop Time  1400    SLP Time Calculation (min) 41 min    Activity Tolerance Patient tolerated treatment well           Past Medical History:  Diagnosis Date  . Hepatitis C   . Hyperlipidemia   . Hypertension   . Osteoporosis   . Stroke ([place])   . Thrombocytosis ([place])     Past Surgical History:  Procedure Laterality Date  . IR ANGIO INTRA EXTRACRAN SEL INTERNAL CAROTID BILAT MOD SED  02/24/2020  . IR ANGIO VERTEBRAL SEL VERTEBRAL UNI R MOD SED  02/24/2020  . IR ANGIOGRAM SELECTIVE EACH ADDITIONAL VESSEL  02/24/2020  . IR IVC FILTER PLMT / S&I /IMG GUID/MOD SED  02/22/2020  . IR RADIOLOGIST EVAL & MGMT  05/23/2020  . IR THROMBECT VENO MECH MOD SED  02/24/2020  . IR US GUIDE VASC ACCESS RIGHT  02/24/2020  . IR US GUIDE VASC ACCESS RIGHT  02/24/2020  . IR VENO SAGITTAL SINUS  02/24/2020  . IR VENO/JUGULAR RIGHT  02/24/2020  . RADIOLOGY WITH ANESTHESIA N/A 02/24/2020   Procedure: IR WITH ANESTHESIA;  Surgeon: Radiologist, Medication, MD;  Location: [facility];  Service: Radiology;  Laterality: N/A;    There were no vitals filed for this visit.   Subjective Assessment - 06/22/20 1324    Subjective "Well, I'm still having - trouble - - - what was it this morning?" (pt, reL any changes since 2 weeks ago)    Patient is accompained by: Family member   husband   Currently in Pain? No/denies                 ADULT SLP TREATMENT - 06/22/20 1325      General Information    Behavior/Cognition Alert;Cooperative;Pleasant mood;Requires cueing      Treatment Provided   Treatment provided Cognitive-Linquistic      Cognitive-Linquistic Treatment   Treatment focused on Cognition;Aphasia    Skilled Treatment SLP reviewed pt's homework with her - pt wrote out letter fill-ins but had difficulty reading them to SLP - ? necessary to assess reading. Pt read single words on [place] withtout difficulty, however, and consistently chose the correct word to match with the picture. SLP initiated the Cognitve LinguistiQuick Teest (CLQT) today with pt having difficulty with memory, planning, and awareness.       Assessment / Recommendations / Plan   Plan Continue with current plan of care;New goals to be determined pending instrumental study;Other (Comment)   *pending copmletion of CLQT, PRN     Progression Toward Goals   Progression toward goals Progressing toward goals            SLP Education - 06/22/20 1437    Education Details "the more you practice your talking he more you will be able to talk later."    Person(s) Educated Patient;Spouse    Methods Explanation    Comprehension Verbalized understanding  SLP Short Term Goals - 06/22/20 1439      SLP SHORT TERM GOAL #1   Title pt will complete simple-mod complex naming tasks with 80% success x2 sessions    Time 4    Period Weeks   or 9 total sessions, for all STGs   Status On-going      SLP SHORT TERM GOAL #2   Title pt will complete any further testing for ST goal writing in the first 4 sessions    Time 2    Period Weeks   or 5 total sessions   Status On-going      SLP SHORT TERM GOAL #3   Title pt will bring a memory notebook/binder/planner to 4 therapy sessions    Time 4    Period Weeks    Status On-going            SLP Long Term Goals - 06/22/20 1439      SLP LONG TERM GOAL #1   Title pt will demo appropriate use of her binder/planner in 4 sessions    Time 8    Period  Weeks   or 17 total sessions, for all LTGs   Status On-going      SLP LONG TERM GOAL #2   Title pt will use anomic compensations for 10 minutes functional communication in mod complex conversation in 3 sessions    Time 8    Period Weeks    Status On-going            Plan - 06/22/20 1438    Clinical Impression Statement Pt presents today with anomia, alexia with letter fill-ins (although pt read single words without difficulty on [employer]). Deficits in cognitive linguistic areas such as memory were noted and Cognitve Linguistic Quick Test to be completed next visit. She is unable to indpenedently take her meds and requires consistent cueing from husband to do so safely. Skilled ST is recommended to improve pt's cognitive linguisitic ability and improve pt to as close to her PLOF as possible.    Speech Therapy Frequency 2x / week    Duration --   8 weeks or 17 total sessions   Treatment/Interventions Environmental controls;Functional tasks;Compensatory techniques;SLP instruction and feedback;Cueing hierarchy;Language facilitation;Cognitive reorganization;Compensatory strategies;Internal/external aids;Patient/family education    Potential to Achieve Goals Good    Potential Considerations Severity of impairments    Consulted and Agree with Plan of Care Patient           Patient will benefit from skilled therapeutic intervention in order to improve the following deficits and impairments:   Aphasia  Cognitive communication deficit  Oral phase dysphagia    Problem List Patient Active Problem List   Diagnosis Date Noted  . Cerebral venous sinus thrombosis 03/30/2020  . Presence of IVC filter   . Benign essential HTN   . Peripheral edema   . Impulsive   . Hypoalbuminemia due to protein-calorie malnutrition ([place])   . Anemia   . Prediabetes   . Steroid-induced hyperglycemia   . Embolic stroke ([place]) 16/08/9603  . Dysphagia   . Vascular headache   . Essential  thrombocytosis ([place])   . Venous thrombosis   . Polycythemia   . Encounter for central line care   . Nontraumatic subcortical hemorrhage of right cerebral hemisphere ([place])   . Abnormal CT of the chest   . Atrial flutter with rapid ventricular response ([place])   . Pulmonary embolus ([place]) 02/22/2020  . Polycythemia vera ([place]) 02/17/2020  .  Likely Cerebral amyloid angiopathy ([place]) 02/17/2020  . IVH (intraventricular hemorrhage) ([place]) 02/14/2020  . TIA (transient ischemic attack) 08/17/2014  . Acute sinusitis 08/16/2014  . Headache 08/15/2014  . Hypothyroidism 08/15/2014  . Hepatitis C   . Hyperlipidemia   . Hypertension     [place] ,[place], [place]  06/22/2020, 2:41 PM  [facility] [address] [place], [place], [address] Phone: [phone]   Fax:  [phone]   Name: [name] MRN: [id number] Date of Birth: 1946/06/08

## 2020-06-22 NOTE — Patient Instructions (Signed)
Complete the homework you did ont get to. Work at least 60-90 minutes a day, no less than 20 minutes at a time.

## 2020-06-26 ENCOUNTER — Other Ambulatory Visit: Payer: Self-pay

## 2020-06-26 ENCOUNTER — Ambulatory Visit: Payer: Medicare HMO

## 2020-06-26 ENCOUNTER — Other Ambulatory Visit: Payer: Self-pay | Admitting: Physician Assistant

## 2020-06-26 DIAGNOSIS — R1311 Dysphagia, oral phase: Secondary | ICD-10-CM | POA: Diagnosis not present

## 2020-06-26 DIAGNOSIS — R41841 Cognitive communication deficit: Secondary | ICD-10-CM

## 2020-06-26 DIAGNOSIS — R4701 Aphasia: Secondary | ICD-10-CM | POA: Diagnosis not present

## 2020-06-26 NOTE — Consult Note (Signed)
Chief Complaint: Improved clot burden. Request is for IVC filter removal   Referring Physician(s): Dr. Loretta Plume  Supervising Physician: Corrie Mckusick  Patient Status: [facility] - Out-pt  History of Present Illness: [name] is a 74 y.o. female History of intraventricular hemorrhage , with polycthemia vera essential thrombocytosis, HIT and bilateral pulmonary emboli. On 4.13.21 IR placed a retrievable IVC filter. Patient is currently on xarelto. Venogram from 7.9.21 shows an improved clot burden. Team is requesting IVC filter removal.  Past Medical History:  Diagnosis Date  . Hepatitis C   . Hyperlipidemia   . Hypertension   . Osteoporosis   . Stroke ([place])   . Thrombocytosis ([place])     Past Surgical History:  Procedure Laterality Date  . IR ANGIO INTRA EXTRACRAN SEL INTERNAL CAROTID BILAT MOD SED  02/24/2020  . IR ANGIO VERTEBRAL SEL VERTEBRAL UNI R MOD SED  02/24/2020  . IR ANGIOGRAM SELECTIVE EACH ADDITIONAL VESSEL  02/24/2020  . IR IVC FILTER PLMT / S&I /IMG GUID/MOD SED  02/22/2020  . IR RADIOLOGIST EVAL & MGMT  05/23/2020  . IR THROMBECT VENO MECH MOD SED  02/24/2020  . IR US GUIDE VASC ACCESS RIGHT  02/24/2020  . IR US GUIDE VASC ACCESS RIGHT  02/24/2020  . IR VENO SAGITTAL SINUS  02/24/2020  . IR VENO/JUGULAR RIGHT  02/24/2020  . RADIOLOGY WITH ANESTHESIA N/A 02/24/2020   Procedure: IR WITH ANESTHESIA;  Surgeon: Radiologist, Medication, MD;  Location: [place];  Service: Radiology;  Laterality: N/A;    Allergies: Heparin, Amoxicillin-pot clavulanate, and Erythromycin  Medications: Prior to Admission medications   Medication Sig Start Date End Date Taking? Authorizing Provider  acetaminophen (TYLENOL) 500 MG tablet Take 1,000 mg by mouth every 6 (six) hours as needed for moderate pain or headache.   Yes [provider]  amLODipine (NORVASC) 10 MG tablet Take 10 mg by mouth daily.  05/22/20  Yes Magrinat, Virgie Dad, MD  atorvastatin (LIPITOR) 10 MG tablet Take 1  tablet (10 mg total) by mouth daily. Patient taking differently: Take 10 mg by mouth every evening.  03/29/20  Yes Love, Ivan Anchors, PA-C  docusate sodium (COLACE) 100 MG capsule Take 100 mg by mouth daily.   Yes [provider]  Fluticasone-Umeclidin-Vilant (TRELEGY ELLIPTA) 100-62.5-25 MCG/INH AEPB Inhale 1 puff into the lungs daily.   Yes [provider]  hydroxyurea (HYDREA) 500 MG capsule Take 1 capsule (500 mg total) by mouth daily. May take with food to minimize GI side effects. 06/15/20  Yes Magrinat, Virgie Dad, MD  levothyroxine (SYNTHROID) 125 MCG tablet Take 1 tablet (125 mcg total) by mouth daily before breakfast. 03/29/20  Yes Love, Ivan Anchors, PA-C  losartan (COZAAR) 50 MG tablet Take 1 tablet (50 mg total) by mouth 2 (two) times daily. 03/29/20  Yes Love, Ivan Anchors, PA-C  pantoprazole (PROTONIX) 40 MG tablet Take 1 tablet (40 mg total) by mouth daily. 03/29/20  Yes Love, Ivan Anchors, PA-C  rivaroxaban (XARELTO) 20 MG TABS tablet Take 1 tablet (20 mg total) by mouth daily with breakfast. 03/29/20  Yes Love, Ivan Anchors, PA-C  QUEtiapine (SEROQUEL) 25 MG tablet Take 1 tablet (25 mg total) by mouth at bedtime as needed (for agitation). Patient not taking: Reported on 05/12/2020 03/29/20   Love, Ivan Anchors, PA-C  senna-docusate (SENOKOT-S) 8.6-50 MG tablet Take 2 tablets by mouth 2 (two) times daily. Patient not taking: Reported on 06/22/2020 03/29/20   Bary Leriche, PA-C  Family History  Problem Relation Age of Onset  . Heart disease Mother   . Heart disease Father     Social History   Socioeconomic History  . Marital status: Married    Spouse name: Not on file  . Number of children: Not on file  . Years of education: Not on file  . Highest education level: Not on file  Occupational History  . Not on file  Tobacco Use  . Smoking status: Former Research scientist (life sciences)  . Smokeless tobacco: Never Used  Vaping Use  . Vaping Use: Never used  Substance and Sexual Activity  . Alcohol use: No    . Drug use: No  . Sexual activity: Not on file  Other Topics Concern  . Not on file  Social History Narrative  . Not on file   Social Determinants of Health   Financial Resource Strain:   . Difficulty of Paying Living Expenses:   Food Insecurity:   . Worried About Charity fundraiser in the Last Year:   . Arboriculturist in the Last Year:   Transportation Needs:   . Film/video editor (Medical):   Marland Kitchen Lack of Transportation (Non-Medical):   Physical Activity:   . Days of Exercise per Week:   . Minutes of Exercise per Session:   Stress:   . Feeling of Stress :   Social Connections:   . Frequency of Communication with Friends and Family:   . Frequency of Social Gatherings with Friends and Family:   . Attends Religious Services:   . Active Member of Clubs or Organizations:   . Attends Archivist Meetings:   Marland Kitchen Marital Status:      Review of Systems: A 12 point ROS discussed and pertinent positives are indicated in the HPI above.  All other systems are negative.  Review of Systems  Constitutional: Negative for fatigue and fever.  HENT: Negative for congestion.   Respiratory: Negative for cough and shortness of breath.   Gastrointestinal: Negative for abdominal pain, diarrhea, nausea and vomiting.    Vital Signs: There were no vitals taken for this visit.  Physical Exam Vitals and nursing note reviewed.  Constitutional:      Appearance: She is well-developed.  HENT:     Head: Normocephalic and atraumatic.  Eyes:     Conjunctiva/sclera: Conjunctivae normal.  Cardiovascular:     Rate and Rhythm: Normal rate and regular rhythm.     Heart sounds: Normal heart sounds.  Pulmonary:     Effort: Pulmonary effort is normal.     Breath sounds: Normal breath sounds.  Musculoskeletal:        General: Normal range of motion.     Cervical back: Normal range of motion.  Skin:    General: Skin is warm.  Neurological:     Mental Status: She is alert and oriented  to person, place, and time.     Imaging: No results found.  Labs:  CBC: Recent Labs    05/18/20 1033 05/19/20 1509 05/25/20 1045 06/15/20 1137  WBC 1.1* 1.3* 3.1* 3.5*  HGB 8.6* 8.4* 8.1* 9.3*  HCT 26.8* 25.8* 24.7* 29.1*  PLT 101* 131* 167 243    COAGS: Recent Labs    02/14/20 0028 02/14/20 0028 02/24/20 1450 02/25/20 1853 03/20/20 0605 03/21/20 0508 03/21/20 1444 03/22/20 0533  INR 1.1  --  1.3*  --   --   --   --   --   APTT 40*   < >  42*   < > 56* 40* 51* 55*   < > = values in this interval not displayed.    BMP: Recent Labs    05/18/20 1033 05/19/20 1509 05/25/20 1045 06/15/20 1137  NA 142 138 143 143  K 3.7 3.4* 3.7 3.4*  CL 109 105 110 109  CO2 21* 24 23 25   GLUCOSE 113* 144* 124* 112*  BUN 20 20 12 9   CALCIUM 10.1 9.6 9.5 9.9  CREATININE 0.98 0.84 0.92 0.90  GFRNONAA 57* >60 >60 >60  GFRAA >60 >60 >60 >60    LIVER FUNCTION TESTS: Recent Labs    04/20/20 1021 05/18/20 1033 05/25/20 1045 06/15/20 1137  BILITOT 0.3 0.6 0.4 0.7  AST 25 19 20 19   ALT 38 25 28 17   ALKPHOS 86 52 85 78  PROT 7.0 7.1 6.9 7.0  ALBUMIN 3.2* 3.8 3.4* 3.9   Assessment and Plan:  73 y.o, female outpatient. History of intraventricular hemorrhage , with polycthemia vera essential thrombocytosis, HIT and bilateral pulmonary emboli. On 4.13.21 IR placed a retrievable IVC filter. Patient is currently on xarelto. Venogram from 7.9.21 shows an improved clot burden. Team is requesting IVC filter removal.  Patient is allergic to augmentin, erythromycin and Heparin. All labs ( from 8.5.21) are within acceptable parameters. Patient is on xarelto. Patient is afebrile.  Risks and benefits discussed with the patient and the patient's husband including, but not limited to bleeding, infection, contrast induced renal failure, filter fracture or migration which can lead to emergency surgery or even death, strut penetration with damage or irritation to adjacent structures and caval  thrombosis.  All of the patient's questions were answered, patient is agreeable to proceed. Consent signed and in chart.  Thank you for this interesting consult.  I greatly enjoyed meeting [name] and look forward to participating in their care.  A copy of this report was sent to the requesting provider on this date.  Electronically Signed: Jacqualine Mau, NP 06/26/2020, 8:17 PM   I spent a total of  40 Minutes   in face to face in clinical consultation, greater than 50% of which was counseling/coordinating care for IVC filter removal

## 2020-06-26 NOTE — Patient Instructions (Addendum)
Memory Compensation Strategies  1. Use "WARM" strategy. W= write it down A=  associate it R=  repeat it M=  make a mental picture  2. You can keep a Social worker. Use a 3-ring notebook with sections for the following:  calendar, important names and phone numbers, medications, doctors' names/phone numbers, "to do list"/reminders, and a section to journal what you did each day  3. Use a calendar to write appointments down.  4. Write yourself a schedule for the day.  This can be placed on the calendar or in a separate section of the Memory Notebook.  Keeping a regular schedule can help memory.  5. Use medication organizer with sections for each day or morning/evening pills  You may need help loading it  6. Keep a basket, or pegboard by the door.   Place items that you need to take out with you in the basket or on the pegboard.  You may also want to include a message board for reminders.  7. Use sticky notes. Place sticky notes with reminders in a place where the task is performed.  For example:  "turn off the stove" placed by the stove, "lock the door" placed on the door at eye level, "take your medications" on the bathroom mirror or by the place where you normally take your medications  8. Use alarms/timers.  Use while cooking to remind yourself to check on food or as a reminder to take your medicine, or as a reminder to make a call, or as a reminder to perform another task, etc.  9. Use a small tape recorder to record important information and notes for yourself.  ============================================================================   Guide [name] through her checklist if she wants you to help her with her medication administration, [name].

## 2020-06-27 ENCOUNTER — Ambulatory Visit (HOSPITAL_COMMUNITY)
Admission: RE | Admit: 2020-06-27 | Discharge: 2020-06-27 | Disposition: A | Discharge status: Home / Self Care | Payer: Medicare HMO | Source: Ambulatory Visit | Attending: Diagnostic Radiology | Admitting: Diagnostic Radiology

## 2020-06-27 ENCOUNTER — Other Ambulatory Visit: Payer: Self-pay

## 2020-06-27 DIAGNOSIS — Z87891 Personal history of nicotine dependence: Secondary | ICD-10-CM | POA: Insufficient documentation

## 2020-06-27 DIAGNOSIS — Z7901 Long term (current) use of anticoagulants: Secondary | ICD-10-CM | POA: Insufficient documentation

## 2020-06-27 DIAGNOSIS — I10 Essential (primary) hypertension: Secondary | ICD-10-CM | POA: Insufficient documentation

## 2020-06-27 DIAGNOSIS — M810 Age-related osteoporosis without current pathological fracture: Secondary | ICD-10-CM | POA: Diagnosis not present

## 2020-06-27 DIAGNOSIS — Z95828 Presence of other vascular implants and grafts: Secondary | ICD-10-CM

## 2020-06-27 DIAGNOSIS — Z452 Encounter for adjustment and management of vascular access device: Secondary | ICD-10-CM | POA: Diagnosis present

## 2020-06-27 DIAGNOSIS — Z79899 Other long term (current) drug therapy: Secondary | ICD-10-CM | POA: Insufficient documentation

## 2020-06-27 DIAGNOSIS — D473 Essential (hemorrhagic) thrombocythemia: Secondary | ICD-10-CM | POA: Diagnosis not present

## 2020-06-27 DIAGNOSIS — Z8673 Personal history of transient ischemic attack (TIA), and cerebral infarction without residual deficits: Secondary | ICD-10-CM | POA: Insufficient documentation

## 2020-06-27 DIAGNOSIS — E785 Hyperlipidemia, unspecified: Secondary | ICD-10-CM | POA: Diagnosis not present

## 2020-06-27 DIAGNOSIS — Z86711 Personal history of pulmonary embolism: Secondary | ICD-10-CM | POA: Insufficient documentation

## 2020-06-27 DIAGNOSIS — Z79890 Hormone replacement therapy (postmenopausal): Secondary | ICD-10-CM | POA: Insufficient documentation

## 2020-06-27 HISTORY — PX: IR IVC FILTER RETRIEVAL / S&I /IMG GUID/MOD SED: IMG5308

## 2020-06-27 LAB — BASIC METABOLIC PANEL
Anion gap: 10 (ref 5–15)
BUN: 15 mg/dL (ref 8–23)
CO2: 25 mmol/L (ref 22–32)
Calcium: 10.2 mg/dL (ref 8.9–10.3)
Chloride: 106 mmol/L (ref 98–111)
Creatinine, Ser: 0.93 mg/dL (ref 0.44–1.00)
GFR calc Af Amer: >60 mL/min (ref >60)
GFR calc non Af Amer: >60 mL/min (ref >60)
Glucose, Bld: 101 mg/dL — ABNORMAL HIGH (ref 70–99)
Potassium: 3.3 mmol/L — ABNORMAL LOW (ref 3.5–5.1)
Sodium: 141 mmol/L (ref 135–145)

## 2020-06-27 LAB — GLUCOSE, CAPILLARY: Glucose-Capillary: 104 mg/dL — ABNORMAL HIGH (ref 70–99)

## 2020-06-27 MED ORDER — MIDAZOLAM HCL 2 MG/2ML IJ SOLN
INTRAMUSCULAR | Status: AC | PRN
  Administered 2020-06-27: 0.5 mg via INTRAVENOUS

## 2020-06-27 MED ORDER — FENTANYL CITRATE (PF) 100 MCG/2ML IJ SOLN
INTRAMUSCULAR | Status: AC
  Filled 2020-06-27: qty 2

## 2020-06-27 MED ORDER — LIDOCAINE HCL 1 % IJ SOLN
INTRAMUSCULAR | Status: AC
  Filled 2020-06-27: qty 20

## 2020-06-27 MED ORDER — LIDOCAINE HCL 1 % IJ SOLN
INTRAMUSCULAR | Status: AC | PRN
  Administered 2020-06-27: 10 mL

## 2020-06-27 MED ORDER — MIDAZOLAM HCL 2 MG/2ML IJ SOLN
INTRAMUSCULAR | Status: AC
  Filled 2020-06-27: qty 2

## 2020-06-27 MED ORDER — FENTANYL CITRATE (PF) 100 MCG/2ML IJ SOLN
INTRAMUSCULAR | Status: AC | PRN
  Administered 2020-06-27: 25 ug via INTRAVENOUS

## 2020-06-27 MED ORDER — SODIUM CHLORIDE 0.9 % IV SOLN: INTRAVENOUS | Status: DC

## 2020-06-27 NOTE — Sedation Documentation (Signed)
Attempted to call report

## 2020-06-27 NOTE — Procedures (Signed)
Interventional Radiology Procedure Note  Procedure:   Right IJ access for IVC filter retrieval.    Findings: The bard denali was removed with all tines intact.    Complications: None Recommendations:  -1 hr dc home - advance diet - Do not submerge for 7 days - Routine care   Signed,  Dulcy Fanny. Earleen Newport, DO

## 2020-06-27 NOTE — Sedation Documentation (Signed)
Attempted to call report, second attempt.

## 2020-06-27 NOTE — Discharge Instructions (Addendum)
Inferior Vena Cava Filter Removal, Care After This sheet gives you information about how to care for yourself after your procedure. Your health care provider may also give you more specific instructions. If you have problems or questions, contact your health care provider. What can I expect after the procedure? After the procedure, it is common to have:  Mild pain and bruising around your incision in your neck or groin.  Fatigue. Follow these instructions at home: Incision care   Follow instructions from your health care provider about how to take care of your incision. Make sure you: ? Wash your hands with soap and water before you change your bandage (dressing). If soap and water are not available, use hand sanitizer. ? Remove your dressing as told by your health care provider. In 24 hours  Check your incision area every day for signs of infection. Check for: ? Redness, swelling, or more pain. ? Fluid or blood. ? Warmth. ? Pus or a bad smell. General instructions  Take over-the-counter and prescription medicines only as told by your health care provider.  Do not take baths, swim, or use a hot tub until your health care provider approves. Ask your health care provider if you may take showers. You may only be allowed to take sponge baths.  Do not drive for 24 hours if you were given a medicine to help you relax (sedative) during your procedure.  Return to your normal activities as told by your health care provider. Ask your health care provider what activities are safe for you.  Keep all follow-up visits as told by your health care provider. This is important. Contact a health care provider if:  You have chills or a fever.  You have redness, swelling, or more pain around your incision.  Your incision feels warm to the touch.  You have pus or a bad smell coming from your incision. Get help right away if:  You have blood coming from your incision (active bleeding). ? If you  have bleeding from the incision site, lie down, apply pressure to the area with a clean cloth or gauze, and get help right away.  You have chest pain.  You have difficulty breathing. Summary  Follow instructions from your health care provider about how to take care of your incision.  Return to your normal activities as told by your health care provider.  Check your incision area every day for signs of infection.  Get help right away if you have active bleeding, chest pain, or trouble breathing. This information is not intended to replace advice given to you by your health care provider. Make sure you discuss any questions you have with your health care provider. Document Revised: 10/10/2017 Document Reviewed: 05/08/2017 Elsevier Patient Education  [place].  Moderate Conscious Sedation, Adult, Care After These instructions provide you with information about caring for yourself after your procedure. Your health care provider may also give you more specific instructions. Your treatment has been planned according to current medical practices, but problems sometimes occur. Call your health care provider if you have any problems or questions after your procedure. What can I expect after the procedure? After your procedure, it is common:  To feel sleepy for several hours.  To feel clumsy and have poor balance for several hours.  To have poor judgment for several hours.  To vomit if you eat too soon. Follow these instructions at home: For at least 24 hours after the procedure:   Do not: ?  Participate in activities where you could fall or become injured. ? Drive. ? Use heavy machinery. ? Drink alcohol. ? Take sleeping pills or medicines that cause drowsiness. ? Make important decisions or sign legal documents. ? Take care of children on your own.  Rest. Eating and drinking  Follow the diet recommended by your health care provider.  If you vomit: ? Drink water,  juice, or soup when you can drink without vomiting. ? Make sure you have little or no nausea before eating solid foods. General instructions  Have a responsible adult stay with you until you are awake and alert.  Take over-the-counter and prescription medicines only as told by your health care provider.  If you smoke, do not smoke without supervision.  Keep all follow-up visits as told by your health care provider. This is important. Contact a health care provider if:  You keep feeling nauseous or you keep vomiting.  You feel light-headed.  You develop a rash.  You have a fever. Get help right away if:  You have trouble breathing. This information is not intended to replace advice given to you by your health care provider. Make sure you discuss any questions you have with your health care provider. Document Revised: 10/10/2017 Document Reviewed: 02/17/2016 Elsevier Patient Education  2020 [employer].

## 2020-06-29 ENCOUNTER — Ambulatory Visit: Payer: Medicare HMO | Admitting: Speech Pathology

## 2020-06-29 ENCOUNTER — Other Ambulatory Visit: Payer: Self-pay

## 2020-06-29 DIAGNOSIS — R4701 Aphasia: Secondary | ICD-10-CM | POA: Diagnosis not present

## 2020-06-29 DIAGNOSIS — R1311 Dysphagia, oral phase: Secondary | ICD-10-CM | POA: Diagnosis not present

## 2020-06-29 DIAGNOSIS — R41841 Cognitive communication deficit: Secondary | ICD-10-CM | POA: Diagnosis not present

## 2020-06-29 NOTE — Patient Instructions (Signed)
In your notebook, at the beginning of the day OR even the night before, write the date and your schedule/to do list for the next day. Include any appointments, errands, chores or activities, visits with family or friends, phone calls you need to make, etc.   Review your schedule each morning and throughout the day. Keep it with you when you go out. Check items off your list as you complete them.  Bring your notebook when you come therapy.

## 2020-06-29 NOTE — Therapy (Signed)
[facility] [address] [place], [place], [address] Phone: [phone]   Fax:  [phone]  Speech Language Pathology Treatment  Patient Details  Name: [name] MRN: [id number] Date of Birth: July 26, 1946 Referring Provider (SLP): Alysia Penna MD   Encounter Date: 06/26/2020   End of Session - 06/29/20 1250    Visit Number 3    Number of Visits 17    Date for SLP Re-Evaluation 08/30/20   90 days   SLP Start Time 1450    SLP Stop Time  1531    SLP Time Calculation (min) 41 min    Activity Tolerance Patient tolerated treatment well           Past Medical History:  Diagnosis Date  . Hepatitis C   . Hyperlipidemia   . Hypertension   . Osteoporosis   . Stroke ([place])   . Thrombocytosis ([place])     Past Surgical History:  Procedure Laterality Date  . IR ANGIO INTRA EXTRACRAN SEL INTERNAL CAROTID BILAT MOD SED  02/24/2020  . IR ANGIO VERTEBRAL SEL VERTEBRAL UNI R MOD SED  02/24/2020  . IR ANGIOGRAM SELECTIVE EACH ADDITIONAL VESSEL  02/24/2020  . IR IVC FILTER PLMT / S&I /IMG GUID/MOD SED  02/22/2020  . IR IVC FILTER RETRIEVAL / S&I /IMG GUID/MOD SED  06/27/2020  . IR RADIOLOGIST EVAL & MGMT  05/23/2020  . IR THROMBECT VENO MECH MOD SED  02/24/2020  . IR US GUIDE VASC ACCESS RIGHT  02/24/2020  . IR US GUIDE VASC ACCESS RIGHT  02/24/2020  . IR VENO SAGITTAL SINUS  02/24/2020  . IR VENO/JUGULAR RIGHT  02/24/2020  . RADIOLOGY WITH ANESTHESIA N/A 02/24/2020   Procedure: IR WITH ANESTHESIA;  Surgeon: Radiologist, Medication, MD;  Location: [facility];  Service: Radiology;  Laterality: N/A;    There were no vitals filed for this visit.          ADULT SLP TREATMENT - 06/29/20 0001      General Information   Behavior/Cognition Alert;Cooperative;Pleasant mood;Requires cueing      Treatment Provided   Treatment provided Cognitive-Linquistic      Cognitive-Linquistic Treatment   Treatment focused on Cognition      Skilled Treatment 14 minute discussion about how to make pt more independent with medication admiinstration. Pt has written the procedure down so LSP encouraged husband to walk pt through her own sequence each time pt asks ihm for help. After this SLP cont'd with Cognitive Linguistic Quick Test. MAzes subtest was completed with pt successful with first maze and timed out for the second maze.      Assessment / Recommendations / Plan   Plan Continue with current plan of care;New goals to be determined pending instrumental study      Progression Toward Goals   Progression toward goals Progressing toward goals            SLP Education - 06/29/20 1249    Education Details pt's medication administration assistance by husband    Person(s) Educated Patient;Spouse    Methods Explanation    Comprehension Verbalized understanding;Need further instruction            SLP Short Term Goals - 06/29/20 1252      SLP SHORT TERM GOAL #1   Title pt will complete simple-mod complex naming tasks with 80% success x2 sessions    Time 3    Period Weeks   or 9 total sessions, for all STGs  Status On-going      SLP SHORT TERM GOAL #2   Title pt will complete any further testing for ST goal writing in the first 4 sessions    Time 1    Period Weeks   or 5 total sessions   Status On-going      SLP SHORT TERM GOAL #3   Title pt will bring a memory notebook/binder/planner to 4 therapy sessions    Time 3    Period Weeks    Status On-going            SLP Long Term Goals - 06/29/20 1253      SLP LONG TERM GOAL #1   Title pt will demo appropriate use of her binder/planner in 4 sessions    Time 7    Period Weeks   or 17 total sessions, for all LTGs   Status On-going      SLP LONG TERM GOAL #2   Title pt will use anomic compensations for 10 minutes functional communication in mod complex conversation in 3 sessions    Time 7    Period Weeks    Status On-going            Plan - 06/29/20  1250    Clinical Impression Statement Pt presents today with anomia, alexia with letter fill-ins (although pt read single words without difficulty on [employer]). Pt and husband were educated about how to make pt's medication administration more independent for pt. Deficits in cognitive linguistic areas such as memory were noted and Cognitve Linguistic Quick Test to be completed next visit (06-29-20). She is unable to indpenedently take her meds and requires consistent cueing from husband to do so safely. Skilled ST is recommended to improve pt's cognitive linguisitic ability and improve pt to as close to her PLOF as possible.    Speech Therapy Frequency 2x / week    Duration --   8 weeks or 17 total sessions   Treatment/Interventions Environmental controls;Functional tasks;Compensatory techniques;SLP instruction and feedback;Cueing hierarchy;Language facilitation;Cognitive reorganization;Compensatory strategies;Internal/external aids;Patient/family education    Potential to Achieve Goals Good    Potential Considerations Severity of impairments    Consulted and Agree with Plan of Care Patient           Patient will benefit from skilled therapeutic intervention in order to improve the following deficits and impairments:   Cognitive communication deficit  Oral phase dysphagia    Problem List Patient Active Problem List   Diagnosis Date Noted  . Cerebral venous sinus thrombosis 03/30/2020  . Presence of IVC filter   . Benign essential HTN   . Peripheral edema   . Impulsive   . Hypoalbuminemia due to protein-calorie malnutrition ([place])   . Anemia   . Prediabetes   . Steroid-induced hyperglycemia   . Embolic stroke ([place]) 78/29/5621  . Dysphagia   . Vascular headache   . Essential thrombocytosis ([place])   . Venous thrombosis   . Polycythemia   . Encounter for central line care   . Nontraumatic subcortical hemorrhage of right cerebral hemisphere ([place])   . Abnormal CT of the  chest   . Atrial flutter with rapid ventricular response ([place])   . Pulmonary embolus ([place]) 02/22/2020  . Polycythemia vera ([place]) 02/17/2020  . Likely Cerebral amyloid angiopathy ([place]) 02/17/2020  . IVH (intraventricular hemorrhage) ([place]) 02/14/2020  . TIA (transient ischemic attack) 08/17/2014  . Acute sinusitis 08/16/2014  . Headache 08/15/2014  . Hypothyroidism 08/15/2014  . Hepatitis C   .  Hyperlipidemia   . Hypertension     [place] ,[place], [place]  06/29/2020, 12:53 PM  [facility] [address] [place], [place], [address] Phone: [phone]   Fax:  [phone]   Name: GRACI HULCE MRN: [id number] Date of Birth: 05-Jun-1946

## 2020-06-29 NOTE — Therapy (Signed)
[facility] [address] [place], [place], [address] Phone: [phone]   Fax:  [phone]  Speech Language Pathology Treatment  Patient Details  Name: [name] MRN: [id number] Date of Birth: Jun 28, 1946 Referring Provider (SLP): Alysia Penna MD   Encounter Date: 06/29/2020   End of Session - 06/29/20 1807    Visit Number 4    Number of Visits 17    Date for SLP Re-Evaluation 08/30/20    SLP Start Time 9741    SLP Stop Time  1615    SLP Time Calculation (min) 45 min    Activity Tolerance Patient tolerated treatment well           Past Medical History:  Diagnosis Date  . Hepatitis C   . Hyperlipidemia   . Hypertension   . Osteoporosis   . Stroke ([facility])   . Thrombocytosis ([place])     Past Surgical History:  Procedure Laterality Date  . IR ANGIO INTRA EXTRACRAN SEL INTERNAL CAROTID BILAT MOD SED  02/24/2020  . IR ANGIO VERTEBRAL SEL VERTEBRAL UNI R MOD SED  02/24/2020  . IR ANGIOGRAM SELECTIVE EACH ADDITIONAL VESSEL  02/24/2020  . IR IVC FILTER PLMT / S&I /IMG GUID/MOD SED  02/22/2020  . IR IVC FILTER RETRIEVAL / S&I /IMG GUID/MOD SED  06/27/2020  . IR RADIOLOGIST EVAL & MGMT  05/23/2020  . IR THROMBECT VENO MECH MOD SED  02/24/2020  . IR US GUIDE VASC ACCESS RIGHT  02/24/2020  . IR US GUIDE VASC ACCESS RIGHT  02/24/2020  . IR VENO SAGITTAL SINUS  02/24/2020  . IR VENO/JUGULAR RIGHT  02/24/2020  . RADIOLOGY WITH ANESTHESIA N/A 02/24/2020   Procedure: IR WITH ANESTHESIA;  Surgeon: Radiologist, Medication, MD;  Location: [facility];  Service: Radiology;  Laterality: N/A;    There were no vitals filed for this visit.   Subjective Assessment - 06/29/20 1538    Subjective "Trying to work my brain."    Patient is accompained by: Family member    Currently in Pain? No/denies                 ADULT SLP TREATMENT - 06/29/20 1541      General Information   Behavior/Cognition Alert;Cooperative;Pleasant  mood;Requires cueing      Treatment Provided   Treatment provided Cognitive-Linquistic      Cognitive-Linquistic Treatment   Treatment focused on Cognition    Skilled Treatment Completed remaining subtests for Cognitive Linguistic Quick Test. Per aphasia administration scoring, pt scored 29/49 for Non-linguistic Cognition, which is in range of "mild" for ages 75-89. Linguistic/aphasia score was "moderate" 24/56. Clock drawing 5/13 is "severe." Pt scored 0 on design generation. SLP suspected comprehension impairments impacted pt performance in this task. After time completed (0 correct designs out of 3 attempts), SLP reinstructed with more detail ("don't draw outside the box"). Pt drew 5 correct designs, 2 copies, 1 incorrect. SLP explained findings of assessment to pt and spouse, and discussed possibilities for targeting pt's deficits, functionally. They have purchased a notebook but have not used it yet. See pt instructions for suggested activity.       Assessment / Recommendations / Plan   Plan Continue with current plan of care      Progression Toward Goals   Progression toward goals Progressing toward goals            SLP Education - 06/29/20 1807    Education Details use notebook for daily to do list  Person(s) Educated Patient;Spouse    Methods Explanation;Handout    Comprehension Verbalized understanding;Need further instruction            SLP Short Term Goals - 06/29/20 1809      SLP SHORT TERM GOAL #1   Title pt will complete simple-mod complex naming tasks with 80% success x2 sessions    Time 3    Period Weeks   or 9 total sessions, for all STGs   Status On-going      SLP SHORT TERM GOAL #2   Title pt will complete any further testing for ST goal writing in the first 4 sessions    Time 1    Period Weeks   or 5 total sessions   Status Achieved      SLP SHORT TERM GOAL #3   Title pt will bring a memory notebook/binder/planner to 4 therapy sessions    Time 3     Period Weeks    Status On-going            SLP Long Term Goals - 06/29/20 1810      SLP LONG TERM GOAL #1   Title pt will demo appropriate use of her binder/planner in 4 sessions    Time 7    Period Weeks   or 17 total sessions, for all LTGs   Status On-going      SLP LONG TERM GOAL #2   Title pt will use anomic compensations for 10 minutes functional communication in mod complex conversation in 3 sessions    Time 7    Period Weeks    Status On-going            Plan - 06/29/20 1808    Clinical Impression Statement Pt presents with, per aphasia administration of CLQT, mild deficits in non-linguistic cognition and moderate aphasia. She is unable to independently take her meds and requires consistent cueing from husband to do so safely. Skilled ST is recommended to improve pt's cognitive linguisitic ability and improve pt to as close to her PLOF as possible.    Speech Therapy Frequency 2x / week    Duration --   8 weeks or 17 total sessions   Treatment/Interventions Environmental controls;Functional tasks;Compensatory techniques;SLP instruction and feedback;Cueing hierarchy;Language facilitation;Cognitive reorganization;Compensatory strategies;Internal/external aids;Patient/family education    Potential to Achieve Goals Good    Potential Considerations Severity of impairments    Consulted and Agree with Plan of Care Patient           Patient will benefit from skilled therapeutic intervention in order to improve the following deficits and impairments:   Cognitive communication deficit  Aphasia    Problem List Patient Active Problem List   Diagnosis Date Noted  . Cerebral venous sinus thrombosis 03/30/2020  . Presence of IVC filter   . Benign essential HTN   . Peripheral edema   . Impulsive   . Hypoalbuminemia due to protein-calorie malnutrition ([facility])   . Anemia   . Prediabetes   . Steroid-induced hyperglycemia   . Embolic stroke ([facility]) 12/45/8099  . Dysphagia   .  Vascular headache   . Essential thrombocytosis ([place])   . Venous thrombosis   . Polycythemia   . Encounter for central line care   . Nontraumatic subcortical hemorrhage of right cerebral hemisphere ([facility])   . Abnormal CT of the chest   . Atrial flutter with rapid ventricular response ([facility])   . Pulmonary embolus ([facility]) 02/22/2020  . Polycythemia vera ([facility]) 02/17/2020  .  Likely Cerebral amyloid angiopathy ([facility]) 02/17/2020  . IVH (intraventricular hemorrhage) ([facility]) 02/14/2020  . TIA (transient ischemic attack) 08/17/2014  . Acute sinusitis 08/16/2014  . Headache 08/15/2014  . Hypothyroidism 08/15/2014  . Hepatitis C   . Hyperlipidemia   . Hypertension    Deneise Lever, [place], [place] 06/29/2020, 6:10 PM  [facility] [address] [place], [place], [address] Phone: [phone]   Fax:  [phone]   Name: [name] MRN: [id number] Date of Birth: [birth year]

## 2020-07-04 ENCOUNTER — Ambulatory Visit: Payer: Medicare HMO | Admitting: Speech Pathology

## 2020-07-04 DIAGNOSIS — I10 Essential (primary) hypertension: Secondary | ICD-10-CM | POA: Diagnosis not present

## 2020-07-04 DIAGNOSIS — J449 Chronic obstructive pulmonary disease, unspecified: Secondary | ICD-10-CM | POA: Diagnosis not present

## 2020-07-04 DIAGNOSIS — D649 Anemia, unspecified: Secondary | ICD-10-CM | POA: Diagnosis not present

## 2020-07-04 DIAGNOSIS — E039 Hypothyroidism, unspecified: Secondary | ICD-10-CM | POA: Diagnosis not present

## 2020-07-04 DIAGNOSIS — I610 Nontraumatic intracerebral hemorrhage in hemisphere, subcortical: Secondary | ICD-10-CM | POA: Diagnosis not present

## 2020-07-04 DIAGNOSIS — E782 Mixed hyperlipidemia: Secondary | ICD-10-CM | POA: Diagnosis not present

## 2020-07-04 DIAGNOSIS — M1812 Unilateral primary osteoarthritis of first carpometacarpal joint, left hand: Secondary | ICD-10-CM | POA: Diagnosis not present

## 2020-07-04 DIAGNOSIS — M810 Age-related osteoporosis without current pathological fracture: Secondary | ICD-10-CM | POA: Diagnosis not present

## 2020-07-04 DIAGNOSIS — I639 Cerebral infarction, unspecified: Secondary | ICD-10-CM | POA: Diagnosis not present

## 2020-07-05 ENCOUNTER — Other Ambulatory Visit: Payer: Self-pay

## 2020-07-05 ENCOUNTER — Ambulatory Visit: Payer: Medicare HMO

## 2020-07-05 DIAGNOSIS — R1311 Dysphagia, oral phase: Secondary | ICD-10-CM

## 2020-07-05 DIAGNOSIS — R41841 Cognitive communication deficit: Secondary | ICD-10-CM | POA: Diagnosis not present

## 2020-07-05 DIAGNOSIS — R4701 Aphasia: Secondary | ICD-10-CM | POA: Diagnosis not present

## 2020-07-05 NOTE — Patient Instructions (Signed)
On your weekly planning sheet, at the beginning of the day OR even the night before, write the date and your schedule and to do list for the day/next day. Include any appointments, errands, chores or activities, visits with family or friends, phone calls you need to make, etc.   Review your schedule each morning and throughout the day. Keep it with you when you go out. Check items off your list as you complete them.   Bring your notebook when you come therapy.

## 2020-07-05 NOTE — Therapy (Signed)
[facility] [address] [place], [place], [address] Phone: [phone]   Fax:  [phone]  Speech Language Pathology Treatment  Patient Details  Name: [name] MRN: [id number] Date of Birth: 12-05-1945 Referring Provider (SLP): Alysia Penna MD   Encounter Date: 07/05/2020   End of Session - 07/05/20 1142    Visit Number 5    Number of Visits 17    Date for SLP Re-Evaluation 08/30/20    SLP Start Time 2    SLP Stop Time  1105    SLP Time Calculation (min) 45 min    Activity Tolerance Patient tolerated treatment well           Past Medical History:  Diagnosis Date  . Hepatitis C   . Hyperlipidemia   . Hypertension   . Osteoporosis   . Stroke ([facility])   . Thrombocytosis ([facility])     Past Surgical History:  Procedure Laterality Date  . IR ANGIO INTRA EXTRACRAN SEL INTERNAL CAROTID BILAT MOD SED  02/24/2020  . IR ANGIO VERTEBRAL SEL VERTEBRAL UNI R MOD SED  02/24/2020  . IR ANGIOGRAM SELECTIVE EACH ADDITIONAL VESSEL  02/24/2020  . IR IVC FILTER PLMT / S&I /IMG GUID/MOD SED  02/22/2020  . IR IVC FILTER RETRIEVAL / S&I /IMG GUID/MOD SED  06/27/2020  . IR RADIOLOGIST EVAL & MGMT  05/23/2020  . IR THROMBECT VENO MECH MOD SED  02/24/2020  . IR US GUIDE VASC ACCESS RIGHT  02/24/2020  . IR US GUIDE VASC ACCESS RIGHT  02/24/2020  . IR VENO SAGITTAL SINUS  02/24/2020  . IR VENO/JUGULAR RIGHT  02/24/2020  . RADIOLOGY WITH ANESTHESIA N/A 02/24/2020   Procedure: IR WITH ANESTHESIA;  Surgeon: Radiologist, Medication, MD;  Location: [facility];  Service: Radiology;  Laterality: N/A;    There were no vitals filed for this visit.          ADULT SLP TREATMENT - 07/05/20 1026      General Information   Behavior/Cognition Alert;Cooperative;Pleasant mood;Requires cueing      Treatment Provided   Treatment provided Cognitive-Linquistic      Cognitive-Linquistic Treatment   Treatment focused on Cognition    Skilled  Treatment "It bothers me (I can't write down everything to remember it)." Pt and husband and SLP discussed pt's memory notebook (5x7 spiral notebook with loose papers, a mask in it). SLP strongly suggested a 3-ring binder for pt, adn provided husband and pt a weekly planner sheet for pt to begin to use (See pt instructions). Pt/husband  will need more guidance on how to use notebook (journal section, homework section, etc). Husband explained to SLP pt has attempted to make rice with milk and rice without awareness, and has made soup on stove on high without awareness when it was boiling. SLP reiterated 24/7 supervision for pt and educated pt/husband about attention and how it affects memory, and awareness skills necessary for cooking. Pt presenting more and more like one who had premorbid neurological difficulties but had learned to compensate well.  Pt's brain imaging from 02-29-20 indicates numerous chronic microhemorrhages that suggests cerebral amyloid angiopathy.      Assessment / Recommendations / Plan   Plan Continue with current plan of care      Progression Toward Goals   Progression toward goals Progressing toward goals            SLP Education - 07/05/20 1141    Education Details weekly planningsheets, memory notebook (not a  spiral), attention and memory, why supervision is necessary    Person(s) Educated Patient;Spouse    Methods Explanation;Handout    Comprehension Verbalized understanding            SLP Short Term Goals - 07/05/20 1144      SLP SHORT TERM GOAL #1   Title pt will complete simple-mod complex naming tasks with 80% success x2 sessions    Time 2    Period Weeks   or 9 total sessions, for all STGs   Status On-going      SLP SHORT TERM GOAL #2   Title pt will complete any further testing for ST goal writing in the first 4 sessions    Period --   or 5 total sessions   Status Achieved      SLP SHORT TERM GOAL #3   Title pt will bring a memory  notebook/binder/planner to 4 therapy sessions    Baseline 07-05-20    Time 2    Period Weeks    Status On-going            SLP Long Term Goals - 07/05/20 1144      SLP LONG TERM GOAL #1   Title pt will demo appropriate use of her binder/planner in 4 sessions    Time 6    Period Weeks   or 17 total sessions, for all LTGs   Status On-going      SLP LONG TERM GOAL #2   Title pt will use anomic compensations for 10 minutes functional communication in mod complex conversation in 3 sessions    Time 6    Period Weeks    Status On-going            Plan - 07/05/20 1142    Clinical Impression Statement Pt presents today with anomia, and with cognitive communication deficits in attention, awareness, and higher level cognitive functions. See "pt instructions" and "skilled intervention" for more details. Skilled ST is recommended to improve pt's cognitive linguisitic ability and improve pt to as close to her PLOF as possible.    Speech Therapy Frequency 2x / week    Duration --   8 weeks or 17 total sessions   Treatment/Interventions Environmental controls;Functional tasks;Compensatory techniques;SLP instruction and feedback;Cueing hierarchy;Language facilitation;Cognitive reorganization;Compensatory strategies;Internal/external aids;Patient/family education    Potential to Achieve Goals Good    Potential Considerations Severity of impairments    Consulted and Agree with Plan of Care Patient           Patient will benefit from skilled therapeutic intervention in order to improve the following deficits and impairments:   Cognitive communication deficit  Aphasia  Oral phase dysphagia    Problem List Patient Active Problem List   Diagnosis Date Noted  . Cerebral venous sinus thrombosis 03/30/2020  . Presence of IVC filter   . Benign essential HTN   . Peripheral edema   . Impulsive   . Hypoalbuminemia due to protein-calorie malnutrition ([facility])   . Anemia   . Prediabetes   .  Steroid-induced hyperglycemia   . Embolic stroke ([facility]) 30/86/5784  . Dysphagia   . Vascular headache   . Essential thrombocytosis ([facility])   . Venous thrombosis   . Polycythemia   . Encounter for central line care   . Nontraumatic subcortical hemorrhage of right cerebral hemisphere ([facility])   . Abnormal CT of the chest   . Atrial flutter with rapid ventricular response ([facility] [place])   . Pulmonary embolus ([facility]) 02/22/2020  .  Polycythemia vera ([facility]) 02/17/2020  . Likely Cerebral amyloid angiopathy ([facility]) 02/17/2020  . IVH (intraventricular hemorrhage) ([facility]) 02/14/2020  . TIA (transient ischemic attack) 08/17/2014  . Acute sinusitis 08/16/2014  . Headache 08/15/2014  . Hypothyroidism 08/15/2014  . Hepatitis C   . Hyperlipidemia   . Hypertension     Moriya Mitchell ,MS, CCC-SLP  07/05/2020, 11:45 AM  [facility] [address] [place], [place], [address] Phone: [phone]   Fax:  [phone]   Name: [name] MRN: [id number] Date of Birth: [birth year]

## 2020-07-06 ENCOUNTER — Ambulatory Visit: Payer: Medicare HMO | Admitting: Speech Pathology

## 2020-07-06 DIAGNOSIS — R41841 Cognitive communication deficit: Secondary | ICD-10-CM | POA: Diagnosis not present

## 2020-07-06 DIAGNOSIS — K219 Gastro-esophageal reflux disease without esophagitis: Secondary | ICD-10-CM | POA: Diagnosis not present

## 2020-07-06 DIAGNOSIS — D45 Polycythemia vera: Secondary | ICD-10-CM | POA: Diagnosis not present

## 2020-07-06 DIAGNOSIS — R4701 Aphasia: Secondary | ICD-10-CM | POA: Diagnosis not present

## 2020-07-06 DIAGNOSIS — E039 Hypothyroidism, unspecified: Secondary | ICD-10-CM | POA: Diagnosis not present

## 2020-07-06 DIAGNOSIS — E538 Deficiency of other specified B group vitamins: Secondary | ICD-10-CM | POA: Diagnosis not present

## 2020-07-06 DIAGNOSIS — J449 Chronic obstructive pulmonary disease, unspecified: Secondary | ICD-10-CM | POA: Diagnosis not present

## 2020-07-06 DIAGNOSIS — I10 Essential (primary) hypertension: Secondary | ICD-10-CM | POA: Diagnosis not present

## 2020-07-06 DIAGNOSIS — Z79899 Other long term (current) drug therapy: Secondary | ICD-10-CM | POA: Diagnosis not present

## 2020-07-06 DIAGNOSIS — E782 Mixed hyperlipidemia: Secondary | ICD-10-CM | POA: Diagnosis not present

## 2020-07-06 DIAGNOSIS — R1311 Dysphagia, oral phase: Secondary | ICD-10-CM | POA: Diagnosis not present

## 2020-07-06 DIAGNOSIS — R7309 Other abnormal glucose: Secondary | ICD-10-CM | POA: Diagnosis not present

## 2020-07-06 DIAGNOSIS — M810 Age-related osteoporosis without current pathological fracture: Secondary | ICD-10-CM | POA: Diagnosis not present

## 2020-07-06 NOTE — Therapy (Signed)
[facility] [address] [place], [place], [address] Phone: [phone]   Fax:  [phone]  Speech Language Pathology Treatment  Patient Details  Name: [name] MRN: [id number] Date of Birth: [birth year] Referring Provider (SLP): Alysia Penna MD   Encounter Date: 07/06/2020   End of Session - 07/06/20 1317    Visit Number 6    Number of Visits 17    Date for SLP Re-Evaluation 08/30/20    SLP Start Time 59    SLP Stop Time  4431    SLP Time Calculation (min) 40 min    Activity Tolerance Patient tolerated treatment well           Past Medical History:  Diagnosis Date  . Hepatitis C   . Hyperlipidemia   . Hypertension   . Osteoporosis   . Stroke ([facility])   . Thrombocytosis ([facility])     Past Surgical History:  Procedure Laterality Date  . IR ANGIO INTRA EXTRACRAN SEL INTERNAL CAROTID BILAT MOD SED  02/24/2020  . IR ANGIO VERTEBRAL SEL VERTEBRAL UNI R MOD SED  02/24/2020  . IR ANGIOGRAM SELECTIVE EACH ADDITIONAL VESSEL  02/24/2020  . IR IVC FILTER PLMT / S&I /IMG GUID/MOD SED  02/22/2020  . IR IVC FILTER RETRIEVAL / S&I /IMG GUID/MOD SED  06/27/2020  . IR RADIOLOGIST EVAL & MGMT  05/23/2020  . IR THROMBECT VENO MECH MOD SED  02/24/2020  . IR US GUIDE VASC ACCESS RIGHT  02/24/2020  . IR US GUIDE VASC ACCESS RIGHT  02/24/2020  . IR VENO SAGITTAL SINUS  02/24/2020  . IR VENO/JUGULAR RIGHT  02/24/2020  . RADIOLOGY WITH ANESTHESIA N/A 02/24/2020   Procedure: IR WITH ANESTHESIA;  Surgeon: Radiologist, Medication, MD;  Location: [facility];  Service: Radiology;  Laterality: N/A;    There were no vitals filed for this visit.   Subjective Assessment - 07/06/20 1307    Subjective Arrives with binder    Patient is accompained by: Family member    Currently in Pain? No/denies                 ADULT SLP TREATMENT - 07/06/20 1307      General Information   Behavior/Cognition Alert;Cooperative;Pleasant  mood;Requires cueing      Treatment Provided   Treatment provided Cognitive-Linquistic      Pain Assessment   Pain Assessment No/denies pain      Cognitive-Linquistic Treatment   Treatment focused on Cognition    Skilled Treatment Patient had weekly planner sheet filled in, per spouse, "It took her hours." Pt had written medications on the top of the weekly planner sheet and what they were for (haphazard, disorganized). Pt required usual mod cues when using this to tell SLP what medications she took, when and what they were for. Per spouse and pt, this is their biggest priority. SLP suggested creating a separate section in binder for medications. Typed pt medications and provided handout for her notebook. Using this handout pt able to correctly answer 8/8 simple questions about her medications. Extended time and min-mod cues necessary for more complex question (eg: which medication do you take twice a day?). Assisted with organization of notebook with sections for medications, schedule and homework. Occasional min-mod cues necessary for pt to locate items in each section. Pt had made errors on weekly schedule (eg, crossed off "take AM meds" for Wednesday of next week. When SLP asked her to locate current date, pt attempted to look to  husband and phone, not schedule. Opted to use "today" sticker which was most effective in helping pt orient to current day. Role played how spouse should assist pt with using the med list and calendar prior to taking her medications (IDing which meds are to be taken using her med list, and using the calendar to locate day of the week), and reinforced that pt should have supervision when taking meds. Min question cue required for pt to place homework in correct spot in her binder.       Assessment / Recommendations / Plan   Plan Continue with current plan of care      Progression Toward Goals   Progression toward goals Progressing toward goals            SLP  Education - 07/06/20 1316    Education Details memory notebook, pt should have supervision with meds    Person(s) Educated Patient;Spouse    Methods Explanation;Handout    Comprehension Verbalized understanding            SLP Short Term Goals - 07/06/20 1317      SLP SHORT TERM GOAL #1   Title pt will complete simple-mod complex naming tasks with 80% success x2 sessions    Time 2    Period Weeks   or 9 total sessions, for all STGs   Status On-going      SLP SHORT TERM GOAL #2   Title pt will complete any further testing for ST goal writing in the first 4 sessions    Period --   or 5 total sessions   Status Achieved      SLP SHORT TERM GOAL #3   Title pt will bring a memory notebook/binder/planner to 4 therapy sessions    Baseline 07-05-20    Time 2    Period Weeks    Status On-going            SLP Long Term Goals - 07/06/20 1320      SLP LONG TERM GOAL #1   Title pt will demo appropriate use of her binder/planner in 4 sessions    Time 6    Period Weeks   or 17 total sessions, for all LTGs   Status On-going      SLP LONG TERM GOAL #2   Title pt will use anomic compensations for 10 minutes functional communication in mod complex conversation in 3 sessions    Time 6    Period Weeks    Status On-going            Plan - 07/06/20 1317    Clinical Impression Statement Pt presents today with anomia, and with cognitive communication deficits in attention, awareness, and higher level cognitive functions. See "pt instructions" and "skilled intervention" for more details. Skilled ST is recommended to improve pt's cognitive linguisitic ability and improve pt to as close to her PLOF as possible.    Speech Therapy Frequency 2x / week    Duration --   8 weeks or 17 total sessions   Treatment/Interventions Environmental controls;Functional tasks;Compensatory techniques;SLP instruction and feedback;Cueing hierarchy;Language facilitation;Cognitive reorganization;Compensatory  strategies;Internal/external aids;Patient/family education    Potential to Achieve Goals Good    Potential Considerations Severity of impairments    Consulted and Agree with Plan of Care Patient           Patient will benefit from skilled therapeutic intervention in order to improve the following deficits and impairments:   Cognitive communication deficit  Aphasia  Problem List Patient Active Problem List   Diagnosis Date Noted  . Cerebral venous sinus thrombosis 03/30/2020  . Presence of IVC filter   . Benign essential HTN   . Peripheral edema   . Impulsive   . Hypoalbuminemia due to protein-calorie malnutrition ([facility])   . Anemia   . Prediabetes   . Steroid-induced hyperglycemia   . Embolic stroke ([facility]) 93/55/2174  . Dysphagia   . Vascular headache   . Essential thrombocytosis ([facility])   . Venous thrombosis   . Polycythemia   . Encounter for central line care   . Nontraumatic subcortical hemorrhage of right cerebral hemisphere ([facility])   . Abnormal CT of the chest   . Atrial flutter with rapid ventricular response ([facility])   . Pulmonary embolus ([facility]) 02/22/2020  . Polycythemia vera ([facility]) 02/17/2020  . Likely Cerebral amyloid angiopathy ([facility]) 02/17/2020  . IVH (intraventricular hemorrhage) ([facility]) 02/14/2020  . TIA (transient ischemic attack) 08/17/2014  . Acute sinusitis 08/16/2014  . Headache 08/15/2014  . Hypothyroidism 08/15/2014  . Hepatitis C   . Hyperlipidemia   . Hypertension    Deneise Lever, [place], [place] Speech-Language Pathologist   Aliene Altes 07/06/2020, 1:21 PM  [facility] [address] [place], [place], [address] Phone: [phone]   Fax:  [phone]   Name: [name] MRN: [id number] Date of Birth: 01/31/1946

## 2020-07-07 DIAGNOSIS — H524 Presbyopia: Secondary | ICD-10-CM | POA: Diagnosis not present

## 2020-07-07 DIAGNOSIS — H2513 Age-related nuclear cataract, bilateral: Secondary | ICD-10-CM | POA: Diagnosis not present

## 2020-07-07 DIAGNOSIS — H5203 Hypermetropia, bilateral: Secondary | ICD-10-CM | POA: Diagnosis not present

## 2020-07-09 ENCOUNTER — Other Ambulatory Visit: Payer: Self-pay | Admitting: Physical Medicine and Rehabilitation

## 2020-07-10 ENCOUNTER — Other Ambulatory Visit: Payer: Self-pay

## 2020-07-10 ENCOUNTER — Ambulatory Visit: Payer: Medicare HMO

## 2020-07-10 DIAGNOSIS — R1311 Dysphagia, oral phase: Secondary | ICD-10-CM

## 2020-07-10 DIAGNOSIS — R41841 Cognitive communication deficit: Secondary | ICD-10-CM

## 2020-07-10 DIAGNOSIS — R4701 Aphasia: Secondary | ICD-10-CM | POA: Diagnosis not present

## 2020-07-10 NOTE — Therapy (Signed)
[facility] [address] [place], [place], [address] Phone: [phone]   Fax:  [phone]  Speech Language Pathology Treatment  Patient Details  Name: [name] MRN: [id number] Date of Birth: Apr 10, 1946 Referring Provider (SLP): Alysia Penna MD   Encounter Date: 07/10/2020   End of Session - 07/10/20 2319    Visit Number 7    Number of Visits 17    Date for SLP Re-Evaluation 08/30/20    SLP Start Time 0998    SLP Stop Time  1446    SLP Time Calculation (min) 41 min    Activity Tolerance Patient tolerated treatment well           Past Medical History:  Diagnosis Date  . Hepatitis C   . Hyperlipidemia   . Hypertension   . Osteoporosis   . Stroke ([place])   . Thrombocytosis ([place])     Past Surgical History:  Procedure Laterality Date  . IR ANGIO INTRA EXTRACRAN SEL INTERNAL CAROTID BILAT MOD SED  02/24/2020  . IR ANGIO VERTEBRAL SEL VERTEBRAL UNI R MOD SED  02/24/2020  . IR ANGIOGRAM SELECTIVE EACH ADDITIONAL VESSEL  02/24/2020  . IR IVC FILTER PLMT / S&I /IMG GUID/MOD SED  02/22/2020  . IR IVC FILTER RETRIEVAL / S&I /IMG GUID/MOD SED  06/27/2020  . IR RADIOLOGIST EVAL & MGMT  05/23/2020  . IR THROMBECT VENO MECH MOD SED  02/24/2020  . IR US GUIDE VASC ACCESS RIGHT  02/24/2020  . IR US GUIDE VASC ACCESS RIGHT  02/24/2020  . IR VENO SAGITTAL SINUS  02/24/2020  . IR VENO/JUGULAR RIGHT  02/24/2020  . RADIOLOGY WITH ANESTHESIA N/A 02/24/2020   Procedure: IR WITH ANESTHESIA;  Surgeon: Radiologist, Medication, MD;  Location: [facility];  Service: Radiology;  Laterality: N/A;    There were no vitals filed for this visit.   Subjective Assessment - 07/10/20 1419    Subjective Arrives with her binder.    Patient is accompained by: Family member   [name] husband   Currently in Pain? No/denies                 ADULT SLP TREATMENT - 07/10/20 1424      General Information   Behavior/Cognition  Alert;Cooperative;Pleasant mood;Requires cueing      Treatment Provided   Treatment provided Cognitive-Linquistic      Cognitive-Linquistic Treatment   Treatment focused on Cognition    Skilled Treatment Pt arrived with planner and correctly ID'd today's date and day with using her red sticky and min extra time. She req'd mod A to tell SLP when her next ST was (Thursday). and who else she saw on Thursday. SLP provided example to husband of providing cues for pt's anomia. SLP reviewed her meds with her and pt spontaneously turned to her med list and told SLP her meds with fading cues to tell me what the med is for. She req'd extra time to tell SLP which med is BID.       Assessment / Recommendations / Plan   Plan Continue with current plan of care      Progression Toward Goals   Progression toward goals Progressing toward goals            SLP Education - 07/10/20 2318    Education Details how hosband should cue pt for memory work    [employer]) Educated Patient;Spouse    Methods Explanation;Demonstration    Comprehension Verbalized understanding  SLP Short Term Goals - 07/10/20 2320      SLP SHORT TERM GOAL #1   Title pt will complete simple-mod complex naming tasks with 80% success x2 sessions    Time 1    Period Weeks   or 9 total sessions, for all STGs   Status On-going      SLP SHORT TERM GOAL #2   Title pt will complete any further testing for ST goal writing in the first 4 sessions    Period --   or 5 total sessions   Status Achieved      SLP SHORT TERM GOAL #3   Title pt will bring a memory notebook/binder/planner to 4 therapy sessions    Baseline 07-05-20, 07-06-20, 07-10-20    Time 1    Period Weeks    Status On-going            SLP Long Term Goals - 07/10/20 2320      SLP LONG TERM GOAL #1   Title pt will demo appropriate use of her binder/planner in 4 sessions    Time 5    Period Weeks   or 17 total sessions, for all LTGs   Status On-going       SLP LONG TERM GOAL #2   Title pt will use anomic compensations for 10 minutes functional communication in mod complex conversation in 3 sessions    Time 5    Period Weeks    Status On-going            Plan - 07/10/20 2319    Clinical Impression Statement Pt presents today with anomia, and with cognitive communication deficits in attention, awareness, and higher level cognitive functions. See "pt instructions" and "skilled intervention" for more details. Pt more independent with her notebook than previous session last week. Skilled ST is recommended to improve pt's cognitive linguisitic ability and improve pt to as close to her PLOF as possible.    Speech Therapy Frequency 2x / week    Duration --   8 weeks or 17 total sessions   Treatment/Interventions Environmental controls;Functional tasks;Compensatory techniques;SLP instruction and feedback;Cueing hierarchy;Language facilitation;Cognitive reorganization;Compensatory strategies;Internal/external aids;Patient/family education    Potential to Achieve Goals Good    Potential Considerations Severity of impairments    Consulted and Agree with Plan of Care Patient           Patient will benefit from skilled therapeutic intervention in order to improve the following deficits and impairments:   Cognitive communication deficit  Aphasia  Oral phase dysphagia    Problem List Patient Active Problem List   Diagnosis Date Noted  . Cerebral venous sinus thrombosis 03/30/2020  . Presence of IVC filter   . Benign essential HTN   . Peripheral edema   . Impulsive   . Hypoalbuminemia due to protein-calorie malnutrition ([facility])   . Anemia   . Prediabetes   . Steroid-induced hyperglycemia   . Embolic stroke ([facility] [facility]) 05/39/7673  . Dysphagia   . Vascular headache   . Essential thrombocytosis ([facility])   . Venous thrombosis   . Polycythemia   . Encounter for central line care   . Nontraumatic subcortical hemorrhage of right cerebral hemisphere  ([place])   . Abnormal CT of the chest   . Atrial flutter with rapid ventricular response ([facility])   . Pulmonary embolus ([facility] [facility]) 02/22/2020  . Polycythemia vera ([place]) 02/17/2020  . Likely Cerebral amyloid angiopathy ([facility]) 02/17/2020  . IVH (intraventricular hemorrhage) ([place]) 02/14/2020  . TIA (transient  ischemic attack) 08/17/2014  . Acute sinusitis 08/16/2014  . Headache 08/15/2014  . Hypothyroidism 08/15/2014  . Hepatitis C   . Hyperlipidemia   . Hypertension     [facility] ,[place], [place]  07/10/2020, 11:21 PM  [facility] [address] [place], [place], [address] Phone: [phone]   Fax:  [phone]   Name: [name] MRN: [id number] Date of Birth: [birth year]

## 2020-07-10 NOTE — Patient Instructions (Signed)
Write a "to-do" for each day other than taking meds.

## 2020-07-12 ENCOUNTER — Inpatient Hospital Stay: Payer: Medicare HMO | Attending: Oncology

## 2020-07-12 ENCOUNTER — Telehealth: Payer: Self-pay | Admitting: *Deleted

## 2020-07-12 ENCOUNTER — Other Ambulatory Visit: Payer: Self-pay

## 2020-07-12 DIAGNOSIS — D45 Polycythemia vera: Secondary | ICD-10-CM | POA: Insufficient documentation

## 2020-07-12 LAB — COMPREHENSIVE METABOLIC PANEL
ALT: 18 U/L (ref 0–44)
AST: 18 U/L (ref 15–41)
Albumin: 3.8 g/dL (ref 3.5–5.0)
Alkaline Phosphatase: 71 U/L (ref 38–126)
Anion gap: 7 (ref 5–15)
BUN: 8 mg/dL (ref 8–23)
CO2: 28 mmol/L (ref 22–32)
Calcium: 10.3 mg/dL (ref 8.9–10.3)
Chloride: 110 mmol/L (ref 98–111)
Creatinine, Ser: 0.88 mg/dL (ref 0.44–1.00)
GFR calc Af Amer: >60 mL/min (ref >60)
GFR calc non Af Amer: >60 mL/min (ref >60)
Glucose, Bld: 116 mg/dL — ABNORMAL HIGH (ref 70–99)
Potassium: 3.3 mmol/L — ABNORMAL LOW (ref 3.5–5.1)
Sodium: 145 mmol/L (ref 135–145)
Total Bilirubin: 0.9 mg/dL (ref 0.3–1.2)
Total Protein: 6.7 g/dL (ref 6.5–8.1)

## 2020-07-12 LAB — RETICULOCYTES
Immature Retic Fract: 13.2 % (ref 2.3–15.9)
RBC.: 2.91 MIL/uL — ABNORMAL LOW (ref 3.87–5.11)
Retic Count, Absolute: 57.6 10*3/uL (ref 19.0–186.0)
Retic Ct Pct: 2 % (ref 0.4–3.1)

## 2020-07-12 LAB — CBC WITH DIFFERENTIAL/PLATELET
Abs Immature Granulocytes: 0.01 10*3/uL (ref 0.00–0.07)
Basophils Absolute: 0 10*3/uL (ref 0.0–0.1)
Basophils Relative: 0 %
Eosinophils Absolute: 0.1 10*3/uL (ref 0.0–0.5)
Eosinophils Relative: 1 %
HCT: 30.9 % — ABNORMAL LOW (ref 36.0–46.0)
Hemoglobin: 10.2 g/dL — ABNORMAL LOW (ref 12.0–15.0)
Immature Granulocytes: 0 %
Lymphocytes Relative: 29 %
Lymphs Abs: 1.2 10*3/uL (ref 0.7–4.0)
MCH: 35.1 pg — ABNORMAL HIGH (ref 26.0–34.0)
MCHC: 33 g/dL (ref 30.0–36.0)
MCV: 106.2 fL — ABNORMAL HIGH (ref 80.0–100.0)
Monocytes Absolute: 0.4 10*3/uL (ref 0.1–1.0)
Monocytes Relative: 8 %
Neutro Abs: 2.7 10*3/uL (ref 1.7–7.7)
Neutrophils Relative %: 62 %
Platelets: 167 10*3/uL (ref 150–400)
RBC: 2.91 MIL/uL — ABNORMAL LOW (ref 3.87–5.11)
RDW: 15.5 % (ref 11.5–15.5)
WBC: 4.3 10*3/uL (ref 4.0–10.5)
nRBC: 0 % (ref 0.0–0.2)

## 2020-07-12 LAB — IRON AND TIBC
Iron: 91 ug/dL (ref 41–142)
Saturation Ratios: 32 % (ref 21–57)
TIBC: 284 ug/dL (ref 236–444)
UIBC: 193 ug/dL (ref 120–384)

## 2020-07-12 LAB — FERRITIN: Ferritin: 72 ng/mL (ref 11–307)

## 2020-07-12 NOTE — Telephone Encounter (Signed)
Per MD review of lab today - no change in current dose of Hydrea.  This RN spoke with husband and patient in our lobby per above and written information given with verification that pt is taking 500 mg a day.  Pt scheduled for lab in 1 month.

## 2020-07-13 ENCOUNTER — Encounter: Payer: Self-pay | Admitting: Physical Medicine & Rehabilitation

## 2020-07-13 ENCOUNTER — Encounter: Payer: Medicare HMO | Attending: Physical Medicine & Rehabilitation | Admitting: Physical Medicine & Rehabilitation

## 2020-07-13 ENCOUNTER — Other Ambulatory Visit: Payer: Self-pay

## 2020-07-13 ENCOUNTER — Ambulatory Visit: Payer: Medicare HMO | Attending: Physical Medicine & Rehabilitation | Admitting: Speech Pathology

## 2020-07-13 VITALS — BP 133/81 | HR 75 | Temp 98.5°F | Ht 62.5 in | Wt 139.6 lb

## 2020-07-13 DIAGNOSIS — G08 Intracranial and intraspinal phlebitis and thrombophlebitis: Secondary | ICD-10-CM

## 2020-07-13 DIAGNOSIS — G441 Vascular headache, not elsewhere classified: Secondary | ICD-10-CM | POA: Diagnosis not present

## 2020-07-13 DIAGNOSIS — R1311 Dysphagia, oral phase: Secondary | ICD-10-CM | POA: Diagnosis not present

## 2020-07-13 DIAGNOSIS — I10 Essential (primary) hypertension: Secondary | ICD-10-CM | POA: Diagnosis not present

## 2020-07-13 DIAGNOSIS — R41841 Cognitive communication deficit: Secondary | ICD-10-CM

## 2020-07-13 DIAGNOSIS — I69319 Unspecified symptoms and signs involving cognitive functions following cerebral infarction: Secondary | ICD-10-CM

## 2020-07-13 DIAGNOSIS — I610 Nontraumatic intracerebral hemorrhage in hemisphere, subcortical: Secondary | ICD-10-CM | POA: Diagnosis not present

## 2020-07-13 DIAGNOSIS — R4701 Aphasia: Secondary | ICD-10-CM | POA: Diagnosis not present

## 2020-07-13 DIAGNOSIS — R609 Edema, unspecified: Secondary | ICD-10-CM | POA: Insufficient documentation

## 2020-07-13 DIAGNOSIS — Z95828 Presence of other vascular implants and grafts: Secondary | ICD-10-CM | POA: Insufficient documentation

## 2020-07-13 NOTE — Therapy (Signed)
[facility] [address] [place], [place], [address] Phone: [phone]   Fax:  [phone]  Speech Language Pathology Treatment  Patient Details  Name: Victoria CEGIELSKI MRN: [id number] Date of Birth: 07/04/1946 Referring Provider (SLP): Alysia Penna MD   Encounter Date: 07/13/2020   End of Session - 07/13/20 1655    Visit Number 8    Number of Visits 17    Date for SLP Re-Evaluation 08/30/20    SLP Start Time 1533    SLP Stop Time  1615    SLP Time Calculation (min) 42 min    Activity Tolerance Patient tolerated treatment well           Past Medical History:  Diagnosis Date   Hepatitis C    Hyperlipidemia    Hypertension    Osteoporosis    Stroke ([facility])    Thrombocytosis ([place])     Past Surgical History:  Procedure Laterality Date   IR ANGIO INTRA EXTRACRAN SEL INTERNAL CAROTID BILAT MOD SED  02/24/2020   IR ANGIO VERTEBRAL SEL VERTEBRAL UNI R MOD SED  02/24/2020   IR ANGIOGRAM SELECTIVE EACH ADDITIONAL VESSEL  02/24/2020   IR IVC FILTER PLMT / S&I /IMG GUID/MOD SED  02/22/2020   IR IVC FILTER RETRIEVAL / S&I /IMG GUID/MOD SED  06/27/2020   IR RADIOLOGIST EVAL & MGMT  05/23/2020   IR THROMBECT VENO MECH MOD SED  02/24/2020   IR US GUIDE VASC ACCESS RIGHT  02/24/2020   IR US GUIDE VASC ACCESS RIGHT  02/24/2020   IR VENO SAGITTAL SINUS  02/24/2020   IR VENO/JUGULAR RIGHT  02/24/2020   RADIOLOGY WITH ANESTHESIA N/A 02/24/2020   Procedure: IR WITH ANESTHESIA;  Surgeon: Radiologist, Medication, MD;  Location: [facility];  Service: Radiology;  Laterality: N/A;    There were no vitals filed for this visit.   Subjective Assessment - 07/13/20 1650    Subjective Pt opens binder to schedule    Currently in Pain? No/denies                 ADULT SLP TREATMENT - 07/13/20 1650      General Information   Behavior/Cognition Alert;Cooperative;Pleasant mood;Requires cueing      Treatment Provided     Treatment provided Cognitive-Linquistic      Pain Assessment   Pain Assessment No/denies pain      Cognitive-Linquistic Treatment   Treatment focused on Cognition    Skilled Treatment Pt reports she is enjoying using the planner. Opened to and located today's date independently, however required cues for awareness, attention that date written was "2" not "12". Pt referenced her calendar to tell SLP name of MD she had seen (Kirste[place]); when pt looked to spouse for help, he cued her appropriately to do this. Has been adding a chore to her to do list each day. SLP praised pt and husband for this and encouraged them to continue with daily routine, and adding to-do list item each day. Reviewed complex naming tasks (category naming) which pt had difficulty with. Usual mod cues required to generate words for categories with letter constraints. Ongoing education on anomia compensations and cuing strategies. SLP reinforced pt slow down, as when she attempted to answer quickly this negatively impacted her performance in cognitive-linguistic tasks today.       Assessment / Recommendations / Plan   Plan Continue with current plan of care      Progression Toward Goals   Progression toward goals  Progressing toward goals            SLP Education - 07/13/20 1655    Education Details anomia compensations    Person(s) Educated Patient;Spouse    Methods Explanation;Demonstration    Comprehension Verbalized understanding            SLP Short Term Goals - 07/13/20 1656      SLP SHORT TERM GOAL #1   Title pt will complete simple-mod complex naming tasks with 80% success x2 sessions    Time 1    Period Weeks   or 9 total sessions, for all STGs   Status Not Met      SLP SHORT TERM GOAL #2   Title pt will complete any further testing for ST goal writing in the first 4 sessions    Period --   or 5 total sessions   Status Achieved      SLP SHORT TERM GOAL #3   Title pt will bring a memory  notebook/binder/planner to 4 therapy sessions    Baseline 07-05-20, 07-06-20, 07-10-20 07/13/20    Time 1    Period Weeks    Status Achieved            SLP Long Term Goals - 07/13/20 1656      SLP LONG TERM GOAL #1   Title pt will demo appropriate use of her binder/planner in 4 sessions    Time 5    Period Weeks   or 17 total sessions, for all LTGs   Status On-going      SLP LONG TERM GOAL #2   Title pt will use anomic compensations for 10 minutes functional communication in mod complex conversation in 3 sessions    Time 5    Period Weeks    Status On-going            Plan - 07/13/20 1656    Clinical Impression Statement Pt presents today with anomia, and with cognitive communication deficits in attention, awareness, and higher level cognitive functions. See "pt instructions" and "skilled intervention" for more details. Pt more independent with her notebook than previous session last week. Skilled ST is recommended to improve pt's cognitive linguisitic ability and improve pt to as close to her PLOF as possible.    Speech Therapy Frequency 2x / week    Duration --   8 weeks or 17 total sessions   Treatment/Interventions Environmental controls;Functional tasks;Compensatory techniques;SLP instruction and feedback;Cueing hierarchy;Language facilitation;Cognitive reorganization;Compensatory strategies;Internal/external aids;Patient/family education    Potential to Achieve Goals Good    Potential Considerations Severity of impairments    Consulted and Agree with Plan of Care Patient           Patient will benefit from skilled therapeutic intervention in order to improve the following deficits and impairments:   Cognitive communication deficit  Aphasia    Problem List Patient Active Problem List   Diagnosis Date Noted   Cognitive deficit, post-stroke 07/13/2020   Cerebral venous sinus thrombosis 03/30/2020   Presence of IVC filter    Benign essential HTN    Peripheral  edema    Impulsive    Hypoalbuminemia due to protein-calorie malnutrition ([facility])    Anemia    Prediabetes    Steroid-induced hyperglycemia    Embolic stroke ([facility]) 81/85/6314   Dysphagia    Vascular headache    Essential thrombocytosis ([facility])    Venous thrombosis    Polycythemia    Encounter for central line care  Nontraumatic subcortical hemorrhage of right cerebral hemisphere [facility])    Abnormal CT of the chest    Atrial flutter with rapid ventricular response ([facility])    Pulmonary embolus ([place]) 02/22/2020   Polycythemia vera ([place]) 02/17/2020   Likely Cerebral amyloid angiopathy ([place]) 02/17/2020   IVH (intraventricular hemorrhage) ([place]) 02/14/2020   TIA (transient ischemic attack) 08/17/2014   Acute sinusitis 08/16/2014   Headache 08/15/2014   Hypothyroidism 08/15/2014   Hepatitis C    Hyperlipidemia    Hypertension    Deneise Lever, [employer], CCC-SLP Speech-Language Pathologist  Aliene Altes 07/13/2020, 4:57 PM  [facility] [address] [place], [place], [address] Phone: [phone]   Fax:  [phone]   Name: SHELLY SPENSER MRN: [id number] Date of Birth: 09/06/1946

## 2020-07-13 NOTE — Patient Instructions (Addendum)
Reminder to cont home exercises  Info on Aphasia support group given

## 2020-07-13 NOTE — Progress Notes (Signed)
Subjective:    Patient ID: [name], female    DOB: Apr 18, 1946, 74 y.o.   MRN: [id number]  74 y.o. female with history of HTN, hep C who was admitted on 02/14/2020 with onset of severe persistent headache and nausea vomiting.  CT head done revealing acute IVH in right lateral ventricle with severe chronic ischemic microangiopathy. CBC revealed pancytosis with Plts -  990, Hgb- 17.7 and WBC - 12.0. Neurology questioned bleed due to HTN v/s CAA v/s MDS.  MRI brain showed stable IVH with extensive T2 hyperintensity within the brain and multi foci throughout that had progressed from prior MRI due to microangiopathy v/s amyloid angiopathy.    Dr. Jana Hakim was consulted for input and felt that panmyelosis was due to polycythemia vera. Bone marrow biopsy was negative for leukemia and JAK2 mutation confirming diagnosis.   She underwent leukapheresis x3 and underwent phlebotomy of 1 unit due to hemoglobin of 17.3.  Hydrea was added to help keep hgb/plts in normal range. Depakote was added to help manage headaches.  She developed shortness of breath due to acute BLL emboli and IVC filter was placed on 04/13.  She did develop A. fib with RVR with runs of NSVT as well as a flutter and was started IV metoprolol and amiodarone for rate control.    Hospital course significant for drop in platelets due to HIT and IV Solu-Medrol added.  CT head done 04/14 due to increasing headaches and showed extensive venous sinus thrombosis. She went mechanical thrombectomy with decrease in clot burden and follow up MRI brain 4/16 showed numerous bilateral acute infarcts in cerebral and cerebellar hemispheres with edema left frontal lobe. She was treated with hypertonic saline,  IV solumedrol and started on argatroban for anticoagulation.  Her cognition continued to fluctuate between lethargy and bouts of agitation.    She did develop drug-induced thrombocytopenia which was felt to be related to amiodarone and this was  discontinued as heart rate was controlled.  Anemia with drop in hgb to 7.4 was monitored with serial checks and for signs of bleeding.   She required tube feeds for nutritional support due to oropharyngeal dysphagia that appeared to be cognitive in nature.  She was started on dysphagia 1 nectar liquids and hyperglycemia due to steroids was being managed by sliding scale insulin.  Sedating medications were discontinued and amantadine was added to help with activation.  Therapy was ongoing and activity tolerance was improving.  CIR was recommended due to functional decline   Admit date: 03/10/2020 Discharge date: 03/29/2020  HPI Primary problem is finding the right word as well as remembering things. Patient with venous sinus thrombosis related to polycythemia vera  as well as multiple cerebral and cerebellar infarcts possibly associated with atrial flutter but no severe hemiplegia. She is completed inpatient rehabilitation is now home and is receiving outpatient speech therapy. She lives with her husband who does the majority of cooking and cleaning. Patient is not driving. She is Mod I dressing and bathing No longer using cane for amb  No falls at home Does some cooking using oven with supervision of husband Does laundry on occasion  Bought new BP machine Husband is asking about some irregular heart rate readings noted on the new blood pressure machine. We discussed atrial flutter may come and go. The patient has had no symptoms. In the past she has seen a cardiologist, Dr. Quay Burow   Pain Inventory Average Pain 0 Pain Right Now 0 My pain is  no pain  LOCATION OF PAIN  na  BOWEL Number of stools per week: 3 Oral laxative use Yes  Type of laxative colace Enema or suppository use No  History of colostomy No  Incontinent No   BLADDER Normal and Pads In and out cath, frequency na Able to self cath na Bladder incontinence No  Frequent urination No  Leakage with coughing Yes    Difficulty starting stream No  Incomplete bladder emptying No    Mobility walk without assistance ability to climb steps?  yes do you drive?  no  Function retired  Neuro/Psych tremor  Prior Studies Any changes since last visit?  no  Physicians involved in your care Any changes since last visit?  no   Family History  Problem Relation Age of Onset  . Heart disease Mother   . Heart disease Father    Social History   Socioeconomic History  . Marital status: Married    Spouse name: Not on file  . Number of children: Not on file  . Years of education: Not on file  . Highest education level: Not on file  Occupational History  . Not on file  Tobacco Use  . Smoking status: Former Research scientist (life sciences)  . Smokeless tobacco: Never Used  Vaping Use  . Vaping Use: Never used  Substance and Sexual Activity  . Alcohol use: No  . Drug use: No  . Sexual activity: Not on file  Other Topics Concern  . Not on file  Social History Narrative  . Not on file   Social Determinants of Health   Financial Resource Strain:   . Difficulty of Paying Living Expenses: Not on file  Food Insecurity:   . Worried About Charity fundraiser in the Last Year: Not on file  . Ran Out of Food in the Last Year: Not on file  Transportation Needs:   . Lack of Transportation (Medical): Not on file  . Lack of Transportation (Non-Medical): Not on file  Physical Activity:   . Days of Exercise per Week: Not on file  . Minutes of Exercise per Session: Not on file  Stress:   . Feeling of Stress : Not on file  Social Connections:   . Frequency of Communication with Friends and Family: Not on file  . Frequency of Social Gatherings with Friends and Family: Not on file  . Attends Religious Services: Not on file  . Active Member of Clubs or Organizations: Not on file  . Attends Archivist Meetings: Not on file  . Marital Status: Not on file   Past Surgical History:  Procedure Laterality Date  . IR  ANGIO INTRA EXTRACRAN SEL INTERNAL CAROTID BILAT MOD SED  02/24/2020  . IR ANGIO VERTEBRAL SEL VERTEBRAL UNI R MOD SED  02/24/2020  . IR ANGIOGRAM SELECTIVE EACH ADDITIONAL VESSEL  02/24/2020  . IR IVC FILTER PLMT / S&I /IMG GUID/MOD SED  02/22/2020  . IR IVC FILTER RETRIEVAL / S&I /IMG GUID/MOD SED  06/27/2020  . IR RADIOLOGIST EVAL & MGMT  05/23/2020  . IR THROMBECT VENO MECH MOD SED  02/24/2020  . IR US GUIDE VASC ACCESS RIGHT  02/24/2020  . IR US GUIDE VASC ACCESS RIGHT  02/24/2020  . IR VENO SAGITTAL SINUS  02/24/2020  . IR VENO/JUGULAR RIGHT  02/24/2020  . RADIOLOGY WITH ANESTHESIA N/A 02/24/2020   Procedure: IR WITH ANESTHESIA;  Surgeon: Radiologist, Medication, MD;  Location: [facility];  Service: Radiology;  Laterality: N/A;   Past  Medical History:  Diagnosis Date  . Hepatitis C   . Hyperlipidemia   . Hypertension   . Osteoporosis   . Stroke ([place])   . Thrombocytosis ([facility])    BP 133/81   Pulse 75   Temp 98.5 F (36.9 C)   Ht 5' 2.5" (1.588 m)   Wt 139 lb 9.6 oz (63.3 kg)   SpO2 98%   BMI 25.13 kg/m   Opioid Risk Score:   Fall Risk Score:  `1  Depression screen PHQ 2/9  Depression screen [facility] 2/9 07/13/2020 05/12/2020 04/12/2020  Decreased Interest 0 0 0  Down, Depressed, Hopeless 0 0 0  PHQ - 2 Score 0 0 0  Altered sleeping - - 0  Tired, decreased energy - - 0  Change in appetite - - 3  Feeling bad or failure about yourself  - - 0  Trouble concentrating - - 0  Moving slowly or fidgety/restless - - 3  Suicidal thoughts - - 0  PHQ-9 Score - - 6  Difficult doing work/chores - - Somewhat difficult   Review of Systems  Constitutional: Positive for unexpected weight change.  HENT: Negative.   Eyes: Negative.   Respiratory: Negative.   Cardiovascular: Positive for leg swelling.  Gastrointestinal: Negative.   Endocrine: Negative.   Genitourinary: Negative.   Musculoskeletal: Negative.   Skin: Negative.   Allergic/Immunologic: Negative.   Neurological: Positive for tremors.    Hematological: Bruises/bleeds easily.       Xarelto  Psychiatric/Behavioral: Negative.   All other systems reviewed and are negative.      Objective:   Physical Exam Vitals and nursing note reviewed.  Constitutional:      Appearance: She is obese.  HENT:     Head: Normocephalic and atraumatic.  Eyes:     General: No visual field deficit.    Extraocular Movements: Extraocular movements intact.     Conjunctiva/sclera: Conjunctivae normal.     Pupils: Pupils are equal, round, and reactive to light.  Cardiovascular:     Rate and Rhythm: Normal rate and regular rhythm.     Heart sounds: Normal heart sounds. No murmur heard.   Pulmonary:     Effort: Pulmonary effort is normal. No respiratory distress.     Breath sounds: Normal breath sounds. No stridor. No wheezing.  Abdominal:     General: Abdomen is flat. Bowel sounds are normal. There is no distension.     Palpations: Abdomen is soft. There is no mass.  Musculoskeletal:        General: No tenderness.     Cervical back: Neck supple. No rigidity.     Right lower leg: No edema.     Left lower leg: No edema.  Skin:    General: Skin is warm and dry.  Neurological:     Mental Status: She is alert.     Cranial Nerves: No dysarthria or facial asymmetry.     Motor: No weakness, atrophy, abnormal muscle tone or seizure activity.     Coordination: Romberg sign negative. Coordination normal. Finger-Nose-Finger Test and Heel to [facility] Test normal. Rapid alternating movements normal.     Gait: Gait is intact.     Comments: Oriented to person, needs cues for time, mixes up day of week with month Oriented to [address] Immediate recall 3/3 2Min recall 0/3, but able to use cues Able to spell WORLD forward and backward Serial 7s needs cuing to continue subtraction makes it to 93  Motor strength is 5/5  bilateral deltoid, bicep, tricep, grip, hip flexor, knee extensor, ankle dorsiflexor and plantar flexor   Psychiatric:         Mood and Affect: Mood normal.        Behavior: Behavior normal.           Assessment & Plan:  #1. History of bilateral cerebral and cerebellar infarcts with residual anomia, decreased attention concentration and short-term memory. Physically patient has had excellent recovery is back to normal. We discussed timeframe of recovery for cognitive issues post stroke and expected plateau in function between 9 and 12 months post infarcts. The patient is currently undergoing speech therapy which I think is appropriate. We also discussed [employer] aphasia support group and have given husband a pamphlet for this. Patient will follow-up with neurology for stroke Follow-up with oncology Dr. Jana Hakim for polycythemia vera Recommend cardiology follow-up Dr. Quay Burow for a flutter PCP follow-up Dr. Theadore Nan for general medical Physical medicine rehab follow-up on as-needed basis

## 2020-07-18 ENCOUNTER — Other Ambulatory Visit: Payer: Self-pay

## 2020-07-18 ENCOUNTER — Ambulatory Visit: Payer: Medicare HMO | Admitting: Speech Pathology

## 2020-07-18 DIAGNOSIS — R4701 Aphasia: Secondary | ICD-10-CM | POA: Diagnosis not present

## 2020-07-18 DIAGNOSIS — R41841 Cognitive communication deficit: Secondary | ICD-10-CM

## 2020-07-18 DIAGNOSIS — R1311 Dysphagia, oral phase: Secondary | ICD-10-CM | POA: Diagnosis not present

## 2020-07-18 NOTE — Therapy (Addendum)
[facility] [address] [place], [place], [address] Phone: [phone]   Fax:  [phone]  Speech Language Pathology Treatment  Patient Details  Name: [name] MRN: [id number] Date of Birth: [birth year] Referring Provider (SLP): Alysia Penna MD   Encounter Date: 07/18/2020   End of Session - 07/18/20 1728    Visit Number 9    Number of Visits 17    Date for SLP Re-Evaluation 08/30/20    SLP Start Time 1448    SLP Stop Time  3419    SLP Time Calculation (min) 44 min    Activity Tolerance Patient tolerated treatment well           Past Medical History:  Diagnosis Date  . Hepatitis C   . Hyperlipidemia   . Hypertension   . Osteoporosis   . Stroke ([place])   . Thrombocytosis ([place])     Past Surgical History:  Procedure Laterality Date  . IR ANGIO INTRA EXTRACRAN SEL INTERNAL CAROTID BILAT MOD SED  02/24/2020  . IR ANGIO VERTEBRAL SEL VERTEBRAL UNI R MOD SED  02/24/2020  . IR ANGIOGRAM SELECTIVE EACH ADDITIONAL VESSEL  02/24/2020  . IR IVC FILTER PLMT / S&I /IMG GUID/MOD SED  02/22/2020  . IR IVC FILTER RETRIEVAL / S&I /IMG GUID/MOD SED  06/27/2020  . IR RADIOLOGIST EVAL & MGMT  05/23/2020  . IR THROMBECT VENO MECH MOD SED  02/24/2020  . IR US GUIDE VASC ACCESS RIGHT  02/24/2020  . IR US GUIDE VASC ACCESS RIGHT  02/24/2020  . IR VENO SAGITTAL SINUS  02/24/2020  . IR VENO/JUGULAR RIGHT  02/24/2020  . RADIOLOGY WITH ANESTHESIA N/A 02/24/2020   Procedure: IR WITH ANESTHESIA;  Surgeon: Radiologist, Medication, MD;  Location: [facility];  Service: Radiology;  Laterality: N/A;    There were no vitals filed for this visit.   Subjective Assessment - 07/18/20 1448    Subjective Pt knew way to [place] room    Patient is accompained by: Family member   husband   Currently in Pain? No/denies                 ADULT SLP TREATMENT - 07/18/20 1724      General Information   Behavior/Cognition  Alert;Cooperative;Pleasant mood;Requires cueing      Treatment Provided   Treatment provided Cognitive-Linquistic      Pain Assessment   Pain Assessment No/denies pain      Cognitive-Linquistic Treatment   Treatment focused on Cognition    Skilled Treatment Pt opened notebook to homework without cues. Located calendar/current date with min question cue prompt from therapist. No extra task written on days this week (aside from AM/PM meds), so SLP had pt add task for today and for tomorrow. Pt required min-mod cues to place task appropriately in the to-do section. No details written for yesterday; SLP educated pt/spouse on types of information to record. She wrote outing to [employer] for yesterday (extended time, appropriate cuing from spouse) and grocery store to today. Abstract reasoning created difficulty for pt with categorization task; required mod cues for this (and attention) today. Home tasks (concrete and abstract categorization).       Assessment / Recommendations / Plan   Plan Continue with current plan of care      Progression Toward Goals   Progression toward goals Progressing toward goals              SLP Short Term Goals - 07/18/20 1729  SLP SHORT TERM GOAL #1   Title pt will complete simple-mod complex naming tasks with 80% success x2 sessions    Time 1    Period Weeks   or 9 total sessions, for all STGs   Status Not Met      SLP SHORT TERM GOAL #2   Title pt will complete any further testing for ST goal writing in the first 4 sessions    Period --   or 5 total sessions   Status Achieved      SLP SHORT TERM GOAL #3   Title pt will bring a memory notebook/binder/planner to 4 therapy sessions    Baseline 07-05-20, 07-06-20, 07-10-20 07/13/20    Time 1    Period Weeks    Status Achieved            SLP Long Term Goals - 07/18/20 1729      SLP LONG TERM GOAL #1   Title pt will demo appropriate use of her binder/planner in 4 sessions    Time 4    Period  Weeks   or 17 total sessions, for all LTGs   Status On-going      SLP LONG TERM GOAL #2   Title pt will use anomic compensations for 10 minutes functional communication in mod complex conversation in 3 sessions    Time 4    Period Weeks    Status On-going            Plan - 07/18/20 1729    Clinical Impression Statement Pt presents today with anomia, and with cognitive communication deficits in attention, awareness, and higher level cognitive functions. See "pt instructions" and "skilled intervention" for more details. Pt more independent with her notebook than previous session last week. Skilled ST is recommended to improve pt's cognitive linguisitic ability and improve pt to as close to her PLOF as possible.    Speech Therapy Frequency 2x / week    Duration --   8 weeks or 17 total sessions   Treatment/Interventions Environmental controls;Functional tasks;Compensatory techniques;SLP instruction and feedback;Cueing hierarchy;Language facilitation;Cognitive reorganization;Compensatory strategies;Internal/external aids;Patient/family education    Potential to Achieve Goals Good    Potential Considerations Severity of impairments    Consulted and Agree with Plan of Care Patient           Patient will benefit from skilled therapeutic intervention in order to improve the following deficits and impairments:   Cognitive communication deficit  Aphasia    Problem List Patient Active Problem List   Diagnosis Date Noted  . Cognitive deficit, post-stroke 07/13/2020  . Cerebral venous sinus thrombosis 03/30/2020  . Presence of IVC filter   . Benign essential HTN   . Peripheral edema   . Impulsive   . Hypoalbuminemia due to protein-calorie malnutrition ([facility])   . Anemia   . Prediabetes   . Steroid-induced hyperglycemia   . Embolic stroke ([place] ) 00/93/8182  . Dysphagia   . Vascular headache   . Essential thrombocytosis ([facility])   . Venous thrombosis   . Polycythemia   . Encounter for  central line care   . Nontraumatic subcortical hemorrhage of right cerebral hemisphere ([facility])   . Abnormal CT of the chest   . Atrial flutter with rapid ventricular response ([facility])   . Pulmonary embolus ([facility]) 02/22/2020  . Polycythemia vera ([facility]) 02/17/2020  . Likely Cerebral amyloid angiopathy ([place] [facility]) 02/17/2020  . IVH (intraventricular hemorrhage) ([facility]) 02/14/2020  . TIA (transient ischemic attack) 08/17/2014  . Acute sinusitis 08/16/2014  .  Headache 08/15/2014  . Hypothyroidism 08/15/2014  . Hepatitis C   . Hyperlipidemia   . Hypertension    Deneise Lever, [place], [place] Speech-Language Pathologist   Aliene Altes 07/18/2020, 5:29 PM  [facility] [address] [place], [place], [address] Phone: [phone]   Fax:  [phone]   Name: [name] MRN: [id number] Date of Birth: June 07, 1946

## 2020-07-21 ENCOUNTER — Other Ambulatory Visit: Payer: Self-pay

## 2020-07-21 ENCOUNTER — Ambulatory Visit: Payer: Medicare HMO

## 2020-07-21 DIAGNOSIS — R4701 Aphasia: Secondary | ICD-10-CM | POA: Diagnosis not present

## 2020-07-21 DIAGNOSIS — R41841 Cognitive communication deficit: Secondary | ICD-10-CM | POA: Diagnosis not present

## 2020-07-21 DIAGNOSIS — R1311 Dysphagia, oral phase: Secondary | ICD-10-CM | POA: Diagnosis not present

## 2020-07-21 NOTE — Therapy (Signed)
[facility] [address] [place], [place], [address] Phone: [phone]   Fax:  [phone]  Speech Language Pathology Treatment/Progress Note  Patient Details  Name: [name] MRN: [id number] Date of Birth: 03-12-1946 Referring Provider (SLP): Alysia Penna MD   Encounter Date: 07/21/2020   End of Session - 07/21/20 1731    Visit Number 10    Number of Visits 17    Date for SLP Re-Evaluation 08/30/20    SLP Start Time 1450    SLP Stop Time  1529    SLP Time Calculation (min) 39 min    Activity Tolerance Patient tolerated treatment well           Past Medical History:  Diagnosis Date  . Hepatitis C   . Hyperlipidemia   . Hypertension   . Osteoporosis   . Stroke ([facility])   . Thrombocytosis ([facility])     Past Surgical History:  Procedure Laterality Date  . IR ANGIO INTRA EXTRACRAN SEL INTERNAL CAROTID BILAT MOD SED  02/24/2020  . IR ANGIO VERTEBRAL SEL VERTEBRAL UNI R MOD SED  02/24/2020  . IR ANGIOGRAM SELECTIVE EACH ADDITIONAL VESSEL  02/24/2020  . IR IVC FILTER PLMT / S&I /IMG GUID/MOD SED  02/22/2020  . IR IVC FILTER RETRIEVAL / S&I /IMG GUID/MOD SED  06/27/2020  . IR RADIOLOGIST EVAL & MGMT  05/23/2020  . IR THROMBECT VENO MECH MOD SED  02/24/2020  . IR US GUIDE VASC ACCESS RIGHT  02/24/2020  . IR US GUIDE VASC ACCESS RIGHT  02/24/2020  . IR VENO SAGITTAL SINUS  02/24/2020  . IR VENO/JUGULAR RIGHT  02/24/2020  . RADIOLOGY WITH ANESTHESIA N/A 02/24/2020   Procedure: IR WITH ANESTHESIA;  Surgeon: Radiologist, Medication, MD;  Location: [facility];  Service: Radiology;  Laterality: N/A;    There were no vitals filed for this visit.          ADULT SLP TREATMENT - 07/21/20 1504      General Information   Behavior/Cognition Alert;Cooperative;Pleasant mood;Requires cueing      Treatment Provided   Treatment provided Cognitive-Linquistic      Pain Assessment   Pain Assessment No/denies pain       Cognitive-Linquistic Treatment   Treatment focused on Cognition    Skilled Treatment Pt opened notebook to show SLP, without cues. She located remaining ST sessions with initial min A but faded to independent, adn pt accurately wrote in therapist name once SLP told her, with independence. Pt stated she was challenged with where to put to-do's and where to put events of the day and SLP told pt/husband that with more practice pt will do better - like with the meds. Not all homework was completed - pt to do the rest for next session.      Assessment / Recommendations / Plan   Plan Continue with current plan of care      Progression Toward Goals   Progression toward goals Progressing toward goals            SLP Education - 07/21/20 1730    Education Details continue to work with the "to-do" list routinely and it will become easier    Person(s) Educated Patient    Comprehension Verbalized understanding;Need further instruction            SLP Short Term Goals - 07/21/20 1733      SLP SHORT TERM GOAL #1   Title pt will complete simple-mod complex naming tasks with  80% success x2 sessions    Period --   or 9 total sessions, for all STGs   Status Not Met      SLP SHORT TERM GOAL #2   Title pt will complete any further testing for ST goal writing in the first 4 sessions    Period --   or 5 total sessions   Status Achieved      SLP SHORT TERM GOAL #3   Title pt will bring a memory notebook/binder/planner to 4 therapy sessions    Baseline 07-05-20, 07-06-20, 07-10-20 07/13/20    Status Achieved            SLP Long Term Goals - 07/21/20 1733      SLP LONG TERM GOAL #1   Title pt will demo appropriate use of her binder/planner in 4 sessions    Time 3    Period Weeks   or 17 total sessions, for all LTGs   Status On-going      SLP LONG TERM GOAL #2   Title pt will use anomic compensations for 10 minutes functional communication in mod complex conversation in 3 sessions    Time 3     Period Weeks    Status On-going            Plan - 07/21/20 1732    Clinical Impression Statement Pt presents today with anomia, and with cognitive communication deficits in attention, awareness, and higher level cognitive functions. See "pt instructions" and "skilled intervention" for more details. Pt more independent with her notebook than with this SLP ast time but still needs cues for details. Skilled ST is recommended to improve pt's cognitive linguisitic ability and improve pt to as close to her PLOF as possible.    Speech Therapy Frequency 2x / week    Duration --   8 weeks or 17 total sessions   Treatment/Interventions Environmental controls;Functional tasks;Compensatory techniques;SLP instruction and feedback;Cueing hierarchy;Language facilitation;Cognitive reorganization;Compensatory strategies;Internal/external aids;Patient/family education    Potential to Achieve Goals Good    Potential Considerations Severity of impairments    Consulted and Agree with Plan of Care Patient           Patient will benefit from skilled therapeutic intervention in order to improve the following deficits and impairments:   Cognitive communication deficit  Aphasia  Oral phase dysphagia   Speech Therapy Progress Note  Dates of Reporting Period: 06-08-20 to present  Subjective Statement: Pt has been seen for 10 sessions targeting aphasia and cognitive linguistics  Objective: Her improvement has come largely due to implementation of routines and the use of a planner.  Goal Update: See above.  Plan: See above  Reason Skilled Services are Required: Pt not reached max potential.   Problem List Patient Active Problem List   Diagnosis Date Noted  . Cognitive deficit, post-stroke 07/13/2020  . Cerebral venous sinus thrombosis 03/30/2020  . Presence of IVC filter   . Benign essential HTN   . Peripheral edema   . Impulsive   . Hypoalbuminemia due to protein-calorie malnutrition ([place])   .  Anemia   . Prediabetes   . Steroid-induced hyperglycemia   . Embolic stroke ([place]) 93/23/5573  . Dysphagia   . Vascular headache   . Essential thrombocytosis ([place])   . Venous thrombosis   . Polycythemia   . Encounter for central line care   . Nontraumatic subcortical hemorrhage of right cerebral hemisphere ([place])   . Abnormal CT of the chest   . Atrial  flutter with rapid ventricular response ([place])   . Pulmonary embolus ([place]) 02/22/2020  . Polycythemia vera ([place]) 02/17/2020  . Likely Cerebral amyloid angiopathy ([place]) 02/17/2020  . IVH (intraventricular hemorrhage) ([facility]) 02/14/2020  . TIA (transient ischemic attack) 08/17/2014  . Acute sinusitis 08/16/2014  . Headache 08/15/2014  . Hypothyroidism 08/15/2014  . Hepatitis C   . Hyperlipidemia   . Hypertension     [place] ,[place], [place]  07/21/2020, 5:34 PM  [facility] [address] [place], [place], [address] Phone: [phone]   Fax:  [phone]   Name: [name] MRN: [id number] Date of Birth: 1946/06/26

## 2020-07-21 NOTE — Patient Instructions (Signed)
  Please complete the assigned speech therapy homework prior to your next session and return it to the speech therapist at your next visit.  

## 2020-07-24 ENCOUNTER — Ambulatory Visit: Payer: Medicare HMO

## 2020-07-24 ENCOUNTER — Other Ambulatory Visit: Payer: Self-pay

## 2020-07-24 DIAGNOSIS — R41841 Cognitive communication deficit: Secondary | ICD-10-CM | POA: Diagnosis not present

## 2020-07-24 DIAGNOSIS — R4701 Aphasia: Secondary | ICD-10-CM | POA: Diagnosis not present

## 2020-07-24 DIAGNOSIS — R1311 Dysphagia, oral phase: Secondary | ICD-10-CM

## 2020-07-24 NOTE — Therapy (Signed)
[facility] [address] [place], [place], [address] Phone: [phone]   Fax:  [phone]  Speech Language Pathology Treatment  Patient Details  Name: [name] MRN: [id number] Date of Birth: Aug 21, 1946 Referring Provider (SLP): Alysia Penna MD   Encounter Date: 07/24/2020   End of Session - 07/24/20 1728    Visit Number 11    Number of Visits 17    Date for SLP Re-Evaluation 08/30/20    SLP Start Time 7408    SLP Stop Time  1445    SLP Time Calculation (min) 42 min    Activity Tolerance Patient tolerated treatment well           Past Medical History:  Diagnosis Date  . Hepatitis C   . Hyperlipidemia   . Hypertension   . Osteoporosis   . Stroke ([place])   . Thrombocytosis ([place])     Past Surgical History:  Procedure Laterality Date  . IR ANGIO INTRA EXTRACRAN SEL INTERNAL CAROTID BILAT MOD SED  02/24/2020  . IR ANGIO VERTEBRAL SEL VERTEBRAL UNI R MOD SED  02/24/2020  . IR ANGIOGRAM SELECTIVE EACH ADDITIONAL VESSEL  02/24/2020  . IR IVC FILTER PLMT / S&I /IMG GUID/MOD SED  02/22/2020  . IR IVC FILTER RETRIEVAL / S&I /IMG GUID/MOD SED  06/27/2020  . IR RADIOLOGIST EVAL & MGMT  05/23/2020  . IR THROMBECT VENO MECH MOD SED  02/24/2020  . IR US GUIDE VASC ACCESS RIGHT  02/24/2020  . IR US GUIDE VASC ACCESS RIGHT  02/24/2020  . IR VENO SAGITTAL SINUS  02/24/2020  . IR VENO/JUGULAR RIGHT  02/24/2020  . RADIOLOGY WITH ANESTHESIA N/A 02/24/2020   Procedure: IR WITH ANESTHESIA;  Surgeon: Radiologist, Medication, MD;  Location: [facility];  Service: Radiology;  Laterality: N/A;    There were no vitals filed for this visit.   Subjective Assessment - 07/24/20 1424    Subjective Pt walked to ST room independently.    Patient is accompained by: Family member   david - husb   Currently in Pain? No/denies                 ADULT SLP TREATMENT - 07/24/20 1429      General Information   Behavior/Cognition  Alert;Cooperative;Pleasant mood;Requires cueing      Treatment Provided   Treatment provided Cognitive-Linquistic      Cognitive-Linquistic Treatment   Treatment focused on Cognition;Aphasia    Skilled Treatment Cognition: SLP assisted pt with her homework that was still blank. SLP told pt to have husband assist pt with stimuli pt cannot figure on her own and to mark with a star, dot, etc so SLP can ascertain how much assistance pt req'd. She told SLP when she returns with independence but extra time. SLP asked pt about her "to-do's on Sunday and Saturday and each time pt told me what she did for those days. SLP made pt sticky notes to cue her which section is which. SLP informed pt husband of these  Stressed to husbnad that if these stickies do not work he could come up with another idea that he thinks may work with pt. Husband told SLP at very end of session that husband is still cueing pt to move her "today" sticker. Educated husband that pt aphasia may hvae her verbalize a different day of the week or month, or number - best cue if pt does not realize is for husband to give pt nonverbal cue Psychologist, clinical  look).       Assessment / Recommendations / Plan   Plan Continue with current plan of care      Progression Toward Goals   Progression toward goals Progressing toward goals            SLP Education - 07/24/20 1728    Education Details how to cue nonverbally for pt's language errors she does not realize    Person(s) Educated Patient;Spouse    Methods Explanation;Demonstration    Comprehension Verbalized understanding;Need further instruction            SLP Short Term Goals - 07/21/20 1733      SLP SHORT TERM GOAL #1   Title pt will complete simple-mod complex naming tasks with 80% success x2 sessions    Period --   or 9 total sessions, for all STGs   Status Not Met      SLP SHORT TERM GOAL #2   Title pt will complete any further testing for ST goal writing in the first 4 sessions     Period --   or 5 total sessions   Status Achieved      SLP SHORT TERM GOAL #3   Title pt will bring a memory notebook/binder/planner to 4 therapy sessions    Baseline 07-05-20, 07-06-20, 07-10-20 07/13/20    Status Achieved            SLP Long Term Goals - 07/24/20 1729      SLP LONG TERM GOAL #1   Title pt will demo appropriate use of her binder/planner in 4 sessions    Time 2    Period Weeks   or 17 total sessions, for all LTGs   Status On-going      SLP LONG TERM GOAL #2   Title pt will use anomic compensations for 10 minutes functional communication in mod complex conversation in 3 sessions    Time 2    Period Weeks    Status On-going            Plan - 07/24/20 1729    Clinical Impression Statement Pt presents today with anomia, and with cognitive communication deficits in attention, awareness, and higher level cognitive functions. See "pt instructions" and "skilled intervention" for more details. Skilled ST is recommended to improve pt's cognitive linguisitic ability and improve pt to as close to her PLOF as possible.    Speech Therapy Frequency 2x / week    Duration --   8 weeks or 17 total sessions   Treatment/Interventions Environmental controls;Functional tasks;Compensatory techniques;SLP instruction and feedback;Cueing hierarchy;Language facilitation;Cognitive reorganization;Compensatory strategies;Internal/external aids;Patient/family education    Potential to Achieve Goals Good    Potential Considerations Severity of impairments    Consulted and Agree with Plan of Care Patient           Patient will benefit from skilled therapeutic intervention in order to improve the following deficits and impairments:   Cognitive communication deficit  Aphasia  Oral phase dysphagia    Problem List Patient Active Problem List   Diagnosis Date Noted  . Cognitive deficit, post-stroke 07/13/2020  . Cerebral venous sinus thrombosis 03/30/2020  . Presence of IVC filter    . Benign essential HTN   . Peripheral edema   . Impulsive   . Hypoalbuminemia due to protein-calorie malnutrition ([place])   . Anemia   . Prediabetes   . Steroid-induced hyperglycemia   . Embolic stroke ([place]) 75/64/3329  . Dysphagia   . Vascular headache   .  Essential thrombocytosis ([place])   . Venous thrombosis   . Polycythemia   . Encounter for central line care   . Nontraumatic subcortical hemorrhage of right cerebral hemisphere ([place])   . Abnormal CT of the chest   . Atrial flutter with rapid ventricular response ([place])   . Pulmonary embolus ([place]) 02/22/2020  . Polycythemia vera ([place]) 02/17/2020  . Likely Cerebral amyloid angiopathy ([place]) 02/17/2020  . IVH (intraventricular hemorrhage) ([place]) 02/14/2020  . TIA (transient ischemic attack) 08/17/2014  . Acute sinusitis 08/16/2014  . Headache 08/15/2014  . Hypothyroidism 08/15/2014  . Hepatitis C   . Hyperlipidemia   . Hypertension     [place] ,[place], [place]  07/24/2020, 5:30 PM  [facility] [address] [place], [place], [address] Phone: [phone]   Fax:  [phone]   Name: ANNALUCIA LAINO MRN: [id number] Date of Birth: 11-14-1945

## 2020-07-26 DIAGNOSIS — J449 Chronic obstructive pulmonary disease, unspecified: Secondary | ICD-10-CM | POA: Diagnosis not present

## 2020-07-26 DIAGNOSIS — D649 Anemia, unspecified: Secondary | ICD-10-CM | POA: Diagnosis not present

## 2020-07-26 DIAGNOSIS — I10 Essential (primary) hypertension: Secondary | ICD-10-CM | POA: Diagnosis not present

## 2020-07-26 DIAGNOSIS — I639 Cerebral infarction, unspecified: Secondary | ICD-10-CM | POA: Diagnosis not present

## 2020-07-26 DIAGNOSIS — M810 Age-related osteoporosis without current pathological fracture: Secondary | ICD-10-CM | POA: Diagnosis not present

## 2020-07-26 DIAGNOSIS — I610 Nontraumatic intracerebral hemorrhage in hemisphere, subcortical: Secondary | ICD-10-CM | POA: Diagnosis not present

## 2020-07-26 DIAGNOSIS — M1812 Unilateral primary osteoarthritis of first carpometacarpal joint, left hand: Secondary | ICD-10-CM | POA: Diagnosis not present

## 2020-07-26 DIAGNOSIS — E782 Mixed hyperlipidemia: Secondary | ICD-10-CM | POA: Diagnosis not present

## 2020-07-26 DIAGNOSIS — E039 Hypothyroidism, unspecified: Secondary | ICD-10-CM | POA: Diagnosis not present

## 2020-07-27 ENCOUNTER — Ambulatory Visit: Payer: Medicare HMO | Admitting: Speech Pathology

## 2020-07-27 ENCOUNTER — Other Ambulatory Visit: Payer: Self-pay

## 2020-07-27 DIAGNOSIS — R1311 Dysphagia, oral phase: Secondary | ICD-10-CM | POA: Diagnosis not present

## 2020-07-27 DIAGNOSIS — R41841 Cognitive communication deficit: Secondary | ICD-10-CM | POA: Diagnosis not present

## 2020-07-27 DIAGNOSIS — R4701 Aphasia: Secondary | ICD-10-CM

## 2020-07-28 NOTE — Therapy (Signed)
[facility] [address] [place], [place], [address] Phone: [phone]   Fax:  [phone]  Speech Language Pathology Treatment  Patient Details  Name: [name] MRN: [id number] Date of Birth: Sep 08, 1946 Referring Provider (SLP): Alysia Penna MD   Encounter Date: 07/27/2020   End of Session - 07/28/20 0716    Visit Number 12    Number of Visits 17    Date for SLP Re-Evaluation 08/30/20    SLP Start Time 1400    SLP Stop Time  1447    SLP Time Calculation (min) 47 min    Activity Tolerance Patient tolerated treatment well           Past Medical History:  Diagnosis Date  . Hepatitis C   . Hyperlipidemia   . Hypertension   . Osteoporosis   . Stroke ([place])   . Thrombocytosis ([place])     Past Surgical History:  Procedure Laterality Date  . IR ANGIO INTRA EXTRACRAN SEL INTERNAL CAROTID BILAT MOD SED  02/24/2020  . IR ANGIO VERTEBRAL SEL VERTEBRAL UNI R MOD SED  02/24/2020  . IR ANGIOGRAM SELECTIVE EACH ADDITIONAL VESSEL  02/24/2020  . IR IVC FILTER PLMT / S&I /IMG GUID/MOD SED  02/22/2020  . IR IVC FILTER RETRIEVAL / S&I /IMG GUID/MOD SED  06/27/2020  . IR RADIOLOGIST EVAL & MGMT  05/23/2020  . IR THROMBECT VENO MECH MOD SED  02/24/2020  . IR US GUIDE VASC ACCESS RIGHT  02/24/2020  . IR US GUIDE VASC ACCESS RIGHT  02/24/2020  . IR VENO SAGITTAL SINUS  02/24/2020  . IR VENO/JUGULAR RIGHT  02/24/2020  . RADIOLOGY WITH ANESTHESIA N/A 02/24/2020   Procedure: IR WITH ANESTHESIA;  Surgeon: Radiologist, Medication, MD;  Location: [facility];  Service: Radiology;  Laterality: N/A;    There were no vitals filed for this visit.   Subjective Assessment - 07/28/20 0700    Subjective Pt got out her ST notebook.    Currently in Pain? No/denies                 ADULT SLP TREATMENT - 07/28/20 0701      General Information   Behavior/Cognition Alert;Cooperative;Pleasant mood;Requires cueing      Treatment  Provided   Treatment provided Cognitive-Linquistic      Pain Assessment   Pain Assessment No/denies pain      Cognitive-Linquistic Treatment   Treatment focused on Cognition;Aphasia    Skilled Treatment Cognition (25 minutes) Pt had only a few stars (to denote husband's assist) with category naming HW. SLP asked pt what she did yesterday and she located calendar independently but with extra time.  When SLP asked about items on to do list, pt required min cues to reference sticky notes to ID appropriate section to search for information. Language (22 min): SLP reviewed anomia compensations with pt, when she appeared flustered by word-finding difficulty. With cue to slow down, describe, she was able to retrieve the word. SLP wrote anomia compensations on whiteboard and placed on desk, engaging pt in simple conversation. When anomia occured, SLP directed pt to whiteboard. She was able to slow her rate, but struggled with description without SLP cues. Often fidgeted with pencil, items on table. SLP provided handout for semantic feature analysis and described this to pt and spouse. Used pt anomic instance (gas gauge) as an example. Pt required mod cues to generate descripors in "use", "description" "location" and "group". Provided handout and list of 5 concrete  nouns for practice with this at home.       Assessment / Recommendations / Plan   Plan Continue with current plan of care      Progression Toward Goals   Progression toward goals Progressing toward goals            SLP Education - 07/28/20 0716    Education Details anomia compensations    Person(s) Educated Patient;Spouse    Methods Explanation;Handout    Comprehension Verbalized understanding;Need further instruction            SLP Short Term Goals - 07/27/20 1419      SLP SHORT TERM GOAL #1   Title pt will complete simple-mod complex naming tasks with 80% success x2 sessions    Period --   or 9 total sessions, for all STGs    Status Not Met      SLP SHORT TERM GOAL #2   Title pt will complete any further testing for ST goal writing in the first 4 sessions    Period --   or 5 total sessions   Status Achieved      SLP SHORT TERM GOAL #3   Title pt will bring a memory notebook/binder/planner to 4 therapy sessions    Baseline 07-05-20, 07-06-20, 07-10-20 07/13/20    Status Achieved            SLP Long Term Goals - 07/27/20 1419      SLP LONG TERM GOAL #1   Title pt will demo appropriate use of her binder/planner in 4 sessions    Time 2    Period Weeks   or 17 total sessions, for all LTGs   Status On-going      SLP LONG TERM GOAL #2   Title pt will use anomic compensations for 10 minutes functional communication in mod complex conversation in 3 sessions    Time 2    Period Weeks    Status On-going            Plan - 07/28/20 0717    Clinical Impression Statement Pt presents today with anomia, and with cognitive communication deficits in attention, awareness, and higher level cognitive functions. See "pt instructions" and "skilled intervention" for more details. Skilled ST is recommended to improve pt's cognitive linguisitic ability and improve pt to as close to her PLOF as possible.    Speech Therapy Frequency 2x / week    Duration --   8 weeks or 17 total sessions   Treatment/Interventions Environmental controls;Functional tasks;Compensatory techniques;SLP instruction and feedback;Cueing hierarchy;Language facilitation;Cognitive reorganization;Compensatory strategies;Internal/external aids;Patient/family education    Potential to Achieve Goals Good    Potential Considerations Severity of impairments    Consulted and Agree with Plan of Care Patient           Patient will benefit from skilled therapeutic intervention in order to improve the following deficits and impairments:   Cognitive communication deficit  Aphasia    Problem List Patient Active Problem List   Diagnosis Date Noted  .  Cognitive deficit, post-stroke 07/13/2020  . Cerebral venous sinus thrombosis 03/30/2020  . Presence of IVC filter   . Benign essential HTN   . Peripheral edema   . Impulsive   . Hypoalbuminemia due to protein-calorie malnutrition ([place])   . Anemia   . Prediabetes   . Steroid-induced hyperglycemia   . Embolic stroke ([facility]) 90/24/0973  . Dysphagia   . Vascular headache   . Essential thrombocytosis ([place])   . Venous  thrombosis   . Polycythemia   . Encounter for central line care   . Nontraumatic subcortical hemorrhage of right cerebral hemisphere ([place])   . Abnormal CT of the chest   . Atrial flutter with rapid ventricular response ([place])   . Pulmonary embolus ([place]) 02/22/2020  . Polycythemia vera ([place]) 02/17/2020  . Likely Cerebral amyloid angiopathy ([place]) 02/17/2020  . IVH (intraventricular hemorrhage) ([place]) 02/14/2020  . TIA (transient ischemic attack) 08/17/2014  . Acute sinusitis 08/16/2014  . Headache 08/15/2014  . Hypothyroidism 08/15/2014  . Hepatitis C   . Hyperlipidemia   . Hypertension    Deneise Lever, [place], CCC-SLP Speech-Language Pathologist  Aliene Altes 07/28/2020, 7:18 AM  [facility] [address] [place], [place], [address] Phone: [phone]   Fax:  [phone]   Name: [name] MRN: [id number] Date of Birth: 05-17-1946

## 2020-07-31 ENCOUNTER — Other Ambulatory Visit: Payer: Self-pay

## 2020-07-31 ENCOUNTER — Ambulatory Visit: Payer: Medicare HMO

## 2020-07-31 DIAGNOSIS — R4701 Aphasia: Secondary | ICD-10-CM

## 2020-07-31 DIAGNOSIS — R1311 Dysphagia, oral phase: Secondary | ICD-10-CM | POA: Diagnosis not present

## 2020-07-31 DIAGNOSIS — R41841 Cognitive communication deficit: Secondary | ICD-10-CM | POA: Diagnosis not present

## 2020-07-31 NOTE — Therapy (Signed)
[facility] [address] [place], [place], [address] Phone: [phone]   Fax:  [phone]  Speech Language Pathology Treatment  Patient Details  Name: [name] MRN: [id number] Date of Birth: December 09, 1945 Referring Provider (SLP): Alysia Penna MD   Encounter Date: 07/31/2020   End of Session - 07/31/20 1522    Visit Number 13    Number of Visits 17    Date for SLP Re-Evaluation 08/30/20    SLP Start Time 2    SLP Stop Time  1447    SLP Time Calculation (min) 45 min    Activity Tolerance Patient tolerated treatment well           Past Medical History:  Diagnosis Date  . Hepatitis C   . Hyperlipidemia   . Hypertension   . Osteoporosis   . Stroke ([place])   . Thrombocytosis ([place])     Past Surgical History:  Procedure Laterality Date  . IR ANGIO INTRA EXTRACRAN SEL INTERNAL CAROTID BILAT MOD SED  02/24/2020  . IR ANGIO VERTEBRAL SEL VERTEBRAL UNI R MOD SED  02/24/2020  . IR ANGIOGRAM SELECTIVE EACH ADDITIONAL VESSEL  02/24/2020  . IR IVC FILTER PLMT / S&I /IMG GUID/MOD SED  02/22/2020  . IR IVC FILTER RETRIEVAL / S&I /IMG GUID/MOD SED  06/27/2020  . IR RADIOLOGIST EVAL & MGMT  05/23/2020  . IR THROMBECT VENO MECH MOD SED  02/24/2020  . IR US GUIDE VASC ACCESS RIGHT  02/24/2020  . IR US GUIDE VASC ACCESS RIGHT  02/24/2020  . IR VENO SAGITTAL SINUS  02/24/2020  . IR VENO/JUGULAR RIGHT  02/24/2020  . RADIOLOGY WITH ANESTHESIA N/A 02/24/2020   Procedure: IR WITH ANESTHESIA;  Surgeon: Radiologist, Medication, MD;  Location: [facility];  Service: Radiology;  Laterality: N/A;    There were no vitals filed for this visit.   Subjective Assessment - 07/31/20 1434    Subjective SLP asked pt if how her meds are going. "When you remember them."    Patient is accompained by: Family member   husband   Currently in Pain? No/denies                 ADULT SLP TREATMENT - 07/31/20 1435      General Information    Behavior/Cognition Alert;Cooperative;Pleasant mood;Requires cueing      Treatment Provided   Treatment provided Cognitive-Linquistic      Cognitive-Linquistic Treatment   Treatment focused on Cognition;Aphasia    Skilled Treatment cogntion (30 minutes) Pt opened to her scheudle when asked when she returns, and what today's date is. Cherry told SLP her "to do" fo rtoday was to rebandage her toe (correct). Husband [name] comment made SLP think pt was always forgetting meds, despite alarm reminder and learned Jeda only forgetting meds 10% of the time, usually when pt/husband are not inside their home. SLP explained to husband that pt may require husband to provide meds to patient and then provide cues when alarm goes off. After role-playing with pt/husband about these situations, [name] told SLP "tell her to take her medicine" when asked what he would do when pt's alarm goes off outside the home. SLP reminded [name] of the need to cue as little as possible and referred him back to role-playing situation completed that session with SLP suggestions how to cue pt. (aphasia - speech tx individual) Pt went to family gathering - husband stated that pt talked to "everyone, she did a real good job" when  there. Akilah's brother in law mentioned to [name] that pt "is getting along better." Pt functional/normal in her conversation re: the weekend at the family gathering - extra time was rarely necessary.        Assessment / Recommendations / Plan   Plan Continue with current plan of care      Progression Toward Goals   Progression toward goals Progressing toward goals              SLP Short Term Goals - 07/27/20 1419      SLP SHORT TERM GOAL #1   Title pt will complete simple-mod complex naming tasks with 80% success x2 sessions    Period --   or 9 total sessions, for all STGs   Status Not Met      SLP SHORT TERM GOAL #2   Title pt will complete any further testing for ST goal writing in the first 4  sessions    Period --   or 5 total sessions   Status Achieved      SLP SHORT TERM GOAL #3   Title pt will bring a memory notebook/binder/planner to 4 therapy sessions    Baseline 07-05-20, 07-06-20, 07-10-20 07/13/20    Status Achieved            SLP Long Term Goals - 07/31/20 1523      SLP LONG TERM GOAL #1   Title pt will demo appropriate use of her binder/planner in 4 sessions    Baseline 07-27-20, 07/31/20    Time 1    Period Weeks   or 17 total sessions, for all LTGs   Status On-going      SLP LONG TERM GOAL #2   Title pt will use anomic compensations for 10 minutes functional communication in mod complex conversation in 3 sessions    Time 1    Period Weeks    Status On-going            Plan - 07/31/20 1522    Clinical Impression Statement Pt presents today with anomia, and with cognitive communication deficits in attention, awareness, and higher level cognitive functions. See "skilled intervention" for more details. Skilled ST is recommended to improve pt's cognitive linguisitic ability, assist husband in his cueing methods, and improve Danicka;s skills to as close to her PLOF as possible.    Speech Therapy Frequency 2x / week    Duration --   8 weeks or 17 total sessions   Treatment/Interventions Environmental controls;Functional tasks;Compensatory techniques;SLP instruction and feedback;Cueing hierarchy;Language facilitation;Cognitive reorganization;Compensatory strategies;Internal/external aids;Patient/family education    Potential to Achieve Goals Good    Potential Considerations Severity of impairments    Consulted and Agree with Plan of Care Patient           Patient will benefit from skilled therapeutic intervention in order to improve the following deficits and impairments:   Cognitive communication deficit  Aphasia    Problem List Patient Active Problem List   Diagnosis Date Noted  . Cognitive deficit, post-stroke 07/13/2020  . Cerebral venous sinus  thrombosis 03/30/2020  . Presence of IVC filter   . Benign essential HTN   . Peripheral edema   . Impulsive   . Hypoalbuminemia due to protein-calorie malnutrition ([place])   . Anemia   . Prediabetes   . Steroid-induced hyperglycemia   . Embolic stroke ([place]) 32/44/0102  . Dysphagia   . Vascular headache   . Essential thrombocytosis ([place])   . Venous thrombosis   .  Polycythemia   . Encounter for central line care   . Nontraumatic subcortical hemorrhage of right cerebral hemisphere ([place])   . Abnormal CT of the chest   . Atrial flutter with rapid ventricular response ([place])   . Pulmonary embolus ([place]) 02/22/2020  . Polycythemia vera ([place]) 02/17/2020  . Likely Cerebral amyloid angiopathy ([place]) 02/17/2020  . IVH (intraventricular hemorrhage) ([place]) 02/14/2020  . TIA (transient ischemic attack) 08/17/2014  . Acute sinusitis 08/16/2014  . Headache 08/15/2014  . Hypothyroidism 08/15/2014  . Hepatitis C   . Hyperlipidemia   . Hypertension     [place] ,[place], [place]  07/31/2020, 3:26 PM  [facility] [address] [place], [place], [address] Phone: [phone]   Fax:  [phone]   Name: [name] MRN: [id number] Date of Birth: Dec 26, 1945

## 2020-08-03 ENCOUNTER — Ambulatory Visit: Payer: Medicare HMO

## 2020-08-03 ENCOUNTER — Other Ambulatory Visit: Payer: Self-pay

## 2020-08-03 DIAGNOSIS — R1311 Dysphagia, oral phase: Secondary | ICD-10-CM

## 2020-08-03 DIAGNOSIS — R4701 Aphasia: Secondary | ICD-10-CM | POA: Diagnosis not present

## 2020-08-03 DIAGNOSIS — R41841 Cognitive communication deficit: Secondary | ICD-10-CM

## 2020-08-03 NOTE — Therapy (Signed)
[facility] [address] [place], [place], [address] Phone: [phone]   Fax:  [phone]  Speech Language Pathology Treatment/Renewal Summary  Patient Details  Name: [name] MRN: [id number] Date of Birth: 1946/01/08 Referring Provider (SLP): Alysia Penna MD   Encounter Date: 08/03/2020   End of Session - 08/03/20 1324    Visit Number 14    Number of Visits 22    Date for SLP Re-Evaluation 09/22/20    SLP Start Time 3151    SLP Stop Time  1445    SLP Time Calculation (min) 42 min    Activity Tolerance Patient tolerated treatment well           Past Medical History:  Diagnosis Date  . Hepatitis C   . Hyperlipidemia   . Hypertension   . Osteoporosis   . Stroke ([place])   . Thrombocytosis ([place])     Past Surgical History:  Procedure Laterality Date  . IR ANGIO INTRA EXTRACRAN SEL INTERNAL CAROTID BILAT MOD SED  02/24/2020  . IR ANGIO VERTEBRAL SEL VERTEBRAL UNI R MOD SED  02/24/2020  . IR ANGIOGRAM SELECTIVE EACH ADDITIONAL VESSEL  02/24/2020  . IR IVC FILTER PLMT / S&I /IMG GUID/MOD SED  02/22/2020  . IR IVC FILTER RETRIEVAL / S&I /IMG GUID/MOD SED  06/27/2020  . IR RADIOLOGIST EVAL & MGMT  05/23/2020  . IR THROMBECT VENO MECH MOD SED  02/24/2020  . IR US GUIDE VASC ACCESS RIGHT  02/24/2020  . IR US GUIDE VASC ACCESS RIGHT  02/24/2020  . IR VENO SAGITTAL SINUS  02/24/2020  . IR VENO/JUGULAR RIGHT  02/24/2020  . RADIOLOGY WITH ANESTHESIA N/A 02/24/2020   Procedure: IR WITH ANESTHESIA;  Surgeon: Radiologist, Medication, MD;  Location: [facility];  Service: Radiology;  Laterality: N/A;    There were no vitals filed for this visit.   Subjective Assessment - 08/03/20 1325    Subjective Pt brought in her notebook/planner.    Patient is accompained by: --   david - husband   Currently in Pain? No/denies                 ADULT SLP TREATMENT - 08/03/20 1330      General Information    Behavior/Cognition Alert;Cooperative;Pleasant mood;Requires cueing;Doesn't follow directions      Treatment Provided   Treatment provided Cognitive-Linquistic      Cognitive-Linquistic Treatment   Treatment focused on Cognition;Aphasia    Skilled Treatment (cognition 10 minutes) Upon SLP questioning, pt told SLP what she did yesterday by using her notebook, and recalled something else but req'd SLP mod nonverbal cues to put it in "diary" section instead of "to-do" section which pt was going to do had SLP not cued her. (sp tx - individual) Pt self corected aphasic errors today x3 and stated she used compensations "some times". Semantic feature analysis was completed today and pt req'd min-mod A usually, and extra time consistently for success. SLP provided pt 5 terms/items for her to od at home.       Assessment / Recommendations / Plan   Plan Goals updated      Progression Toward Goals   Progression toward goals Progressing toward goals              SLP Short Term Goals - 07/27/20 1419      SLP SHORT TERM GOAL #1   Title pt will complete simple-mod complex naming tasks with 80% success x2 sessions  Period --   or 9 total sessions, for all STGs   Status Not Met      SLP SHORT TERM GOAL #2   Title pt will complete any further testing for ST goal writing in the first 4 sessions    Period --   or 5 total sessions   Status Achieved      SLP SHORT TERM GOAL #3   Title pt will bring a memory notebook/binder/planner to 4 therapy sessions    Baseline 07-05-20, 07-06-20, 07-10-20 07/13/20    Status Achieved            SLP Long Term Goals - 08/03/20 1333      SLP LONG TERM GOAL #1   Title pt will demo appropriate use of her binder/planner in 4 sessions    Baseline 07-27-20, 07/31/20, 08-03-20    Time 5    Period Weeks   or 22 total sessions, for all LTGs   Status On-going      SLP LONG TERM GOAL #2   Title pt will use anomic compensations for functional communication in 10 minutes  mod complex conversation in 3 sessions    Time 5    Period Weeks    Status On-going            Plan - 08/03/20 1632    Clinical Impression Statement Pt presents today with contiued anomia, and with cognitive communication deficits in attention, awareness, and higher level cognitive functions. Pt uses her memory notebook/planner more consistently now and she and husband state this is assisting pt for more independence. See "skilled intervention" for more details. Skilled ST is recommended to improve pt's cognitive linguisitic ability, assist husband in his cueing methods and understanding of pt's deficits, and improve Minh;s skills to as close to her PLOF as possible.    Speech Therapy Frequency 2x / week    Duration --   4 weeks or 22 total sessions   Treatment/Interventions Environmental controls;Functional tasks;Compensatory techniques;SLP instruction and feedback;Cueing hierarchy;Language facilitation;Cognitive reorganization;Compensatory strategies;Internal/external aids;Patient/family education    Potential to Achieve Goals Good    Potential Considerations Severity of impairments    Consulted and Agree with Plan of Care Patient           Patient will benefit from skilled therapeutic intervention in order to improve the following deficits and impairments:   Aphasia  Cognitive communication deficit  Oral phase dysphagia    Problem List Patient Active Problem List   Diagnosis Date Noted  . Cognitive deficit, post-stroke 07/13/2020  . Cerebral venous sinus thrombosis 03/30/2020  . Presence of IVC filter   . Benign essential HTN   . Peripheral edema   . Impulsive   . Hypoalbuminemia due to protein-calorie malnutrition ([facility])   . Anemia   . Prediabetes   . Steroid-induced hyperglycemia   . Embolic stroke ([place]) 01/00/7121  . Dysphagia   . Vascular headache   . Essential thrombocytosis ([place])   . Venous thrombosis   . Polycythemia   . Encounter for central line care     . Nontraumatic subcortical hemorrhage of right cerebral hemisphere ([place])   . Abnormal CT of the chest   . Atrial flutter with rapid ventricular response ([facility])   . Pulmonary embolus ([place]) 02/22/2020  . Polycythemia vera ([place]) 02/17/2020  . Likely Cerebral amyloid angiopathy ([facility]) 02/17/2020  . IVH (intraventricular hemorrhage) ([facility]) 02/14/2020  . TIA (transient ischemic attack) 08/17/2014  . Acute sinusitis 08/16/2014  . Headache 08/15/2014  . Hypothyroidism  08/15/2014  . Hepatitis C   . Hyperlipidemia   . Hypertension     [name] ,MS, CCC-SLP  08/03/2020, 4:36 PM  [facility] [address] [place], [place], [address] Phone: [phone]   Fax:  [phone]   Name: [name] MRN: [id number] Date of Birth: 1946/06/01

## 2020-08-07 ENCOUNTER — Other Ambulatory Visit: Payer: Self-pay

## 2020-08-07 ENCOUNTER — Ambulatory Visit: Payer: Medicare HMO

## 2020-08-07 DIAGNOSIS — R1311 Dysphagia, oral phase: Secondary | ICD-10-CM

## 2020-08-07 DIAGNOSIS — R41841 Cognitive communication deficit: Secondary | ICD-10-CM | POA: Diagnosis not present

## 2020-08-07 DIAGNOSIS — R4701 Aphasia: Secondary | ICD-10-CM | POA: Diagnosis not present

## 2020-08-07 NOTE — Therapy (Signed)
[facility] [address] [place], [place], [address] Phone: [phone]   Fax:  [phone]  Speech Language Pathology Treatment  Patient Details  Name: [name] MRN: [id number] Date of Birth: 1946/08/27 Referring Provider (SLP): Alysia Penna MD   Encounter Date: 08/07/2020   End of Session - 08/07/20 1732    Visit Number 15    Number of Visits 22    Date for SLP Re-Evaluation 09/22/20    SLP Start Time 5284    SLP Stop Time  1445    SLP Time Calculation (min) 40 min    Activity Tolerance Patient tolerated treatment well           Past Medical History:  Diagnosis Date   Hepatitis C    Hyperlipidemia    Hypertension    Osteoporosis    Stroke ([facility])    Thrombocytosis ([place])     Past Surgical History:  Procedure Laterality Date   IR ANGIO INTRA EXTRACRAN SEL INTERNAL CAROTID BILAT MOD SED  02/24/2020   IR ANGIO VERTEBRAL SEL VERTEBRAL UNI R MOD SED  02/24/2020   IR ANGIOGRAM SELECTIVE EACH ADDITIONAL VESSEL  02/24/2020   IR IVC FILTER PLMT / S&I /IMG GUID/MOD SED  02/22/2020   IR IVC FILTER RETRIEVAL / S&I /IMG GUID/MOD SED  06/27/2020   IR RADIOLOGIST EVAL & MGMT  05/23/2020   IR THROMBECT VENO MECH MOD SED  02/24/2020   IR US GUIDE VASC ACCESS RIGHT  02/24/2020   IR US GUIDE VASC ACCESS RIGHT  02/24/2020   IR VENO SAGITTAL SINUS  02/24/2020   IR VENO/JUGULAR RIGHT  02/24/2020   RADIOLOGY WITH ANESTHESIA N/A 02/24/2020   Procedure: IR WITH ANESTHESIA;  Surgeon: Radiologist, Medication, MD;  Location: [facility];  Service: Radiology;  Laterality: N/A;    There were no vitals filed for this visit.   Subjective Assessment - 08/07/20 1410    Subjective "I tried to do that awful mess you gave me." (semantic feature analysis)    Patient is accompained by: Family member   [name]                ADULT SLP TREATMENT - 08/07/20 1412      General Information   Behavior/Cognition  Alert;Cooperative;Pleasant mood;Requires cueing;Doesn't follow directions      Treatment Provided   Treatment provided Cognitive-Linquistic      Cognitive-Linquistic Treatment   Treatment focused on Cognition;Aphasia    Skilled Treatment Pt looked around in binder for somewhere to put ST copies of semantic task - req'd SLP min-mod A to locate appropriate section in which to put them. SLP guided pt through the semantic feature (SFA) homework - pt did not show marked improvement reviewing semantic features for "broom" from first to second repetition today (SLP completed SFA using "broom" twice with pt today, 3 minutes apart).  SLP engaged in conversation with [name] today to work on aphasia compensations. Pt with functional conversation re: golf (sport she enjoys very much adn played regularly) with questinoing cues and SLP knowing the context so semantic paraphasias did not facilitate communication breakdowns - pt unable to generate "green" x2 with 4 minutes between attempts, despite max cues each attempt. "I can see it but I can't do the word for it." During these times pt husband heard appropriately cueing pt ("you hit your wedge onto it") 80% of the time. Homework is to repeat 5 of the 10 common items for SFA. SLp told pt/husband that we  will liksely wrap up after this next renewal of up to 22 total sessions and that d/c is based on what kind of skill SLP is providing during ST sessions.       Assessment / Recommendations / Plan   Plan Goals updated      Progression Toward Goals   Progression toward goals Progressing toward goals            SLP Education - 08/07/20 1732    Education Details what criteria would be looked at for d/c    Person(s) Educated Patient;Spouse    Methods Explanation    Comprehension Verbalized understanding;Need further instruction            SLP Short Term Goals - 07/27/20 1419      SLP SHORT TERM GOAL #1   Title pt will complete simple-mod complex naming  tasks with 80% success x2 sessions    Period --   or 9 total sessions, for all STGs   Status Not Met      SLP SHORT TERM GOAL #2   Title pt will complete any further testing for ST goal writing in the first 4 sessions    Period --   or 5 total sessions   Status Achieved      SLP SHORT TERM GOAL #3   Title pt will bring a memory notebook/binder/planner to 4 therapy sessions    Baseline 07-05-20, 07-06-20, 07-10-20 07/13/20    Status Achieved            SLP Long Term Goals - 08/07/20 1733      SLP LONG TERM GOAL #1   Title pt will demo appropriate use of her binder/planner in 4 sessions    Baseline 07-27-20, 07/31/20, 08-03-20    Time 4    Period Weeks   or 22 total sessions, for all LTGs   Status On-going      SLP LONG TERM GOAL #2   Title pt will use anomic compensations for functional communication in 10 minutes mod complex conversation in 3 sessions    Time 4    Period Weeks    Status On-going            Plan - 08/07/20 1446    Clinical Impression Statement Pt presents today with contiued anomia, and with cognitive communication deficits in attention, awareness, and higher level cognitive functions. Pt uses her memory notebook/planner more consistently now and she and husband state this is assisting pt for more independence. See "skilled intervention" for more details. Skilled ST is recommended to improve pt's cognitive linguisitic ability, assist husband in his cueing methods and understanding of pt's deficits, and improve [name];s skills to as close to her PLOF as possible.    Speech Therapy Frequency 2x / week    Duration --   4 weeks or 22 total sessions   Treatment/Interventions Environmental controls;Functional tasks;Compensatory techniques;SLP instruction and feedback;Cueing hierarchy;Language facilitation;Cognitive reorganization;Compensatory strategies;Internal/external aids;Patient/family education    Potential to Achieve Goals Good    Potential Considerations Severity  of impairments    Consulted and Agree with Plan of Care Patient           Patient will benefit from skilled therapeutic intervention in order to improve the following deficits and impairments:   Aphasia  Cognitive communication deficit  Oral phase dysphagia    Problem List Patient Active Problem List   Diagnosis Date Noted   Cognitive deficit, post-stroke 07/13/2020   Cerebral venous sinus thrombosis 03/30/2020  Presence of IVC filter    Benign essential HTN    Peripheral edema    Impulsive    Hypoalbuminemia due to protein-calorie malnutrition ([facility])    Anemia    Prediabetes    Steroid-induced hyperglycemia    Embolic stroke ([facility]) 46/80/3212   Dysphagia    Vascular headache    Essential thrombocytosis ([facility])    Venous thrombosis    Polycythemia    Encounter for central line care    Nontraumatic subcortical hemorrhage of right cerebral hemisphere [facility])    Abnormal CT of the chest    Atrial flutter with rapid ventricular response ([facility])    Pulmonary embolus ([place]) 02/22/2020   Polycythemia vera ([place]) 02/17/2020   Likely Cerebral amyloid angiopathy ([place]) 02/17/2020   IVH (intraventricular hemorrhage) ([facility]) 02/14/2020   TIA (transient ischemic attack) 08/17/2014   Acute sinusitis 08/16/2014   Headache 08/15/2014   Hypothyroidism 08/15/2014   Hepatitis C    Hyperlipidemia    Hypertension     , ,MS, CCC-SLP  08/07/2020, 5:35 PM  [facility] [address] [place], [place], [address] Phone: [phone]   Fax:  [phone]   Name: [name] MRN: [id number] Date of Birth: 1945/12/22

## 2020-08-07 NOTE — Patient Instructions (Signed)
Do 5 of the 10 objects you have done prior - write out the ideas you have and let [name] help if you need to.

## 2020-08-09 ENCOUNTER — Inpatient Hospital Stay: Payer: Medicare HMO

## 2020-08-09 ENCOUNTER — Other Ambulatory Visit: Payer: Self-pay

## 2020-08-09 DIAGNOSIS — D45 Polycythemia vera: Secondary | ICD-10-CM | POA: Diagnosis not present

## 2020-08-09 LAB — CBC WITH DIFFERENTIAL/PLATELET
Abs Immature Granulocytes: 0.02 10*3/uL (ref 0.00–0.07)
Basophils Absolute: 0 10*3/uL (ref 0.0–0.1)
Basophils Relative: 0 %
Eosinophils Absolute: 0 10*3/uL (ref 0.0–0.5)
Eosinophils Relative: 0 %
HCT: 36.8 % (ref 36.0–46.0)
Hemoglobin: 12 g/dL (ref 12.0–15.0)
Immature Granulocytes: 0 %
Lymphocytes Relative: 24 %
Lymphs Abs: 1.5 10*3/uL (ref 0.7–4.0)
MCH: 35.9 pg — ABNORMAL HIGH (ref 26.0–34.0)
MCHC: 32.6 g/dL (ref 30.0–36.0)
MCV: 110.2 fL — ABNORMAL HIGH (ref 80.0–100.0)
Monocytes Absolute: 0.4 10*3/uL (ref 0.1–1.0)
Monocytes Relative: 7 %
Neutro Abs: 4.1 10*3/uL (ref 1.7–7.7)
Neutrophils Relative %: 69 %
Platelets: 191 10*3/uL (ref 150–400)
RBC: 3.34 MIL/uL — ABNORMAL LOW (ref 3.87–5.11)
RDW: 14.3 % (ref 11.5–15.5)
WBC: 6.1 10*3/uL (ref 4.0–10.5)
nRBC: 0 % (ref 0.0–0.2)

## 2020-08-09 LAB — RETICULOCYTES
Immature Retic Fract: 14.4 % (ref 2.3–15.9)
RBC.: 3.24 MIL/uL — ABNORMAL LOW (ref 3.87–5.11)
Retic Count, Absolute: 60.3 10*3/uL (ref 19.0–186.0)
Retic Ct Pct: 1.9 % (ref 0.4–3.1)

## 2020-08-09 LAB — COMPREHENSIVE METABOLIC PANEL
ALT: 24 U/L (ref 0–44)
AST: 21 U/L (ref 15–41)
Albumin: 4 g/dL (ref 3.5–5.0)
Alkaline Phosphatase: 79 U/L (ref 38–126)
Anion gap: 7 (ref 5–15)
BUN: 13 mg/dL (ref 8–23)
CO2: 28 mmol/L (ref 22–32)
Calcium: 10 mg/dL (ref 8.9–10.3)
Chloride: 107 mmol/L (ref 98–111)
Creatinine, Ser: 1.08 mg/dL — ABNORMAL HIGH (ref 0.44–1.00)
GFR calc Af Amer: 59 mL/min — ABNORMAL LOW (ref >60)
GFR calc non Af Amer: 51 mL/min — ABNORMAL LOW (ref >60)
Glucose, Bld: 117 mg/dL — ABNORMAL HIGH (ref 70–99)
Potassium: 3.5 mmol/L (ref 3.5–5.1)
Sodium: 142 mmol/L (ref 135–145)
Total Bilirubin: 0.8 mg/dL (ref 0.3–1.2)
Total Protein: 7.2 g/dL (ref 6.5–8.1)

## 2020-08-09 LAB — FERRITIN: Ferritin: 59 ng/mL (ref 11–307)

## 2020-08-09 LAB — IRON AND TIBC
Iron: 86 ug/dL (ref 41–142)
Saturation Ratios: 26 % (ref 21–57)
TIBC: 334 ug/dL (ref 236–444)
UIBC: 247 ug/dL (ref 120–384)

## 2020-08-10 ENCOUNTER — Ambulatory Visit: Payer: Medicare HMO

## 2020-08-10 DIAGNOSIS — R4701 Aphasia: Secondary | ICD-10-CM | POA: Diagnosis not present

## 2020-08-10 DIAGNOSIS — R41841 Cognitive communication deficit: Secondary | ICD-10-CM

## 2020-08-10 DIAGNOSIS — R1311 Dysphagia, oral phase: Secondary | ICD-10-CM

## 2020-08-10 NOTE — Therapy (Signed)
[facility] [address] [place], [place], [address] Phone: [phone]   Fax:  [phone]  Speech Language Pathology Treatment  Patient Details  Name: [name] MRN: [id number] Date of Birth: 1946/05/02 Referring Provider (SLP): Alysia Penna MD   Encounter Date: 08/10/2020   End of Session - 08/10/20 1706    Visit Number 16    Number of Visits 22    Date for SLP Re-Evaluation 09/22/20    SLP Start Time 1319    SLP Stop Time  1400    SLP Time Calculation (min) 41 min    Activity Tolerance Patient tolerated treatment well           Past Medical History:  Diagnosis Date  . Hepatitis C   . Hyperlipidemia   . Hypertension   . Osteoporosis   . Stroke ([facility] [place])   . Thrombocytosis ([place])     Past Surgical History:  Procedure Laterality Date  . IR ANGIO INTRA EXTRACRAN SEL INTERNAL CAROTID BILAT MOD SED  02/24/2020  . IR ANGIO VERTEBRAL SEL VERTEBRAL UNI R MOD SED  02/24/2020  . IR ANGIOGRAM SELECTIVE EACH ADDITIONAL VESSEL  02/24/2020  . IR IVC FILTER PLMT / S&I /IMG GUID/MOD SED  02/22/2020  . IR IVC FILTER RETRIEVAL / S&I /IMG GUID/MOD SED  06/27/2020  . IR RADIOLOGIST EVAL & MGMT  05/23/2020  . IR THROMBECT VENO MECH MOD SED  02/24/2020  . IR US GUIDE VASC ACCESS RIGHT  02/24/2020  . IR US GUIDE VASC ACCESS RIGHT  02/24/2020  . IR VENO SAGITTAL SINUS  02/24/2020  . IR VENO/JUGULAR RIGHT  02/24/2020  . RADIOLOGY WITH ANESTHESIA N/A 02/24/2020   Procedure: IR WITH ANESTHESIA;  Surgeon: Radiologist, Medication, MD;  Location: [facility];  Service: Radiology;  Laterality: N/A;    There were no vitals filed for this visit.          ADULT SLP TREATMENT - 08/10/20 1345      General Information   Behavior/Cognition Alert;Cooperative;Pleasant mood;Requires cueing      Treatment Provided   Treatment provided Cognitive-Linquistic      Cognitive-Linquistic Treatment   Treatment focused on Cognition;Aphasia      Skilled Treatment SLP assisted pt in real-life therapy today, generating appetizers she gets at [employer] every week with her sons and their families. Further, SLP facilitated pt's semantic features by having her name other terms associated with appetizers (squid, salad, artichoke, order, out, etc) Regarding pt's homework pt stated that she needed max A usually for hte first 4 words (previously done in last two sessions) adn had extra time necessary for 5th word (without assistance).  Pt husband noted to cue pt accordingly for an anomic event - SLP praised husband. Pt appeared frustrated at writing out sentences about her anomic words today so SLP reiterated ocncept of neuroplasticity to pt and husband.       Assessment / Recommendations / Plan   Plan Continue with current plan of care      Progression Toward Goals   Progression toward goals Progressing toward goals            SLP Education - 08/10/20 1705    Education Details reminded about concept of neuroplasticity and why important to write out sentences with anomic words    Person(s) Educated Patient;Spouse    Methods Explanation    Comprehension Verbalized understanding            SLP Short Term Goals - 07/27/20  [place] #1   Title pt will complete simple-mod complex naming tasks with 80% success x2 sessions    Period --   or 9 total sessions, for all STGs   Status Not Met      SLP SHORT TERM GOAL #2   Title pt will complete any further testing for ST goal writing in the first 4 sessions    Period --   or 5 total sessions   Status Achieved      SLP SHORT TERM GOAL #3   Title pt will bring a memory notebook/binder/planner to 4 therapy sessions    Baseline 07-05-20, 07-06-20, 07-10-20 07/13/20    Status Achieved            SLP Long Term Goals - 08/10/20 1707      SLP LONG TERM GOAL #1   Title pt will demo appropriate use of her binder/planner in 4 sessions    Baseline 07-27-20, 07/31/20, 08-03-20     Time 4    Period Weeks   or 22 total sessions, for all LTGs   Status On-going      SLP LONG TERM GOAL #2   Title pt will use anomic compensations for functional communication in 10 minutes mod complex conversation in 3 sessions    Time 4    Period Weeks    Status On-going            Plan - 08/10/20 1706    Clinical Impression Statement Pt presents today with contiued anomia, and with cognitive communication deficits in attention, awareness, and higher level cognitive functions. Pt uses her memory notebook/planner more consistently now and she and husband state this is assisting pt for more independence. See "skilled intervention" for more details. Skilled ST is recommended to improve pt's cognitive linguisitic ability, assist husband in his cueing methods and understanding of pt's deficits, and improve Braelynn;s skills to as close to her PLOF as possible.    Speech Therapy Frequency 2x / week    Duration --   4 weeks or 22 total sessions   Treatment/Interventions Environmental controls;Functional tasks;Compensatory techniques;SLP instruction and feedback;Cueing hierarchy;Language facilitation;Cognitive reorganization;Compensatory strategies;Internal/external aids;Patient/family education    Potential to Achieve Goals Good    Potential Considerations Severity of impairments    Consulted and Agree with Plan of Care Patient           Patient will benefit from skilled therapeutic intervention in order to improve the following deficits and impairments:   Aphasia  Cognitive communication deficit  Oral phase dysphagia    Problem List Patient Active Problem List   Diagnosis Date Noted  . Cognitive deficit, post-stroke 07/13/2020  . Cerebral venous sinus thrombosis 03/30/2020  . Presence of IVC filter   . Benign essential HTN   . Peripheral edema   . Impulsive   . Hypoalbuminemia due to protein-calorie malnutrition ([place])   . Anemia   . Prediabetes   . Steroid-induced  hyperglycemia   . Embolic stroke ([facility]) 01/65/5374  . Dysphagia   . Vascular headache   . Essential thrombocytosis ([place])   . Venous thrombosis   . Polycythemia   . Encounter for central line care   . Nontraumatic subcortical hemorrhage of right cerebral hemisphere ([place])   . Abnormal CT of the chest   . Atrial flutter with rapid ventricular response ([place])   . Pulmonary embolus ([facility]) 02/22/2020  . Polycythemia vera ([place]) 02/17/2020  . Likely Cerebral amyloid angiopathy (  [place]) 02/17/2020  . IVH (intraventricular hemorrhage) ([place]) 02/14/2020  . TIA (transient ischemic attack) 08/17/2014  . Acute sinusitis 08/16/2014  . Headache 08/15/2014  . Hypothyroidism 08/15/2014  . Hepatitis C   . Hyperlipidemia   . Hypertension     [place] ,[place], [place]  08/10/2020, 5:07 PM  [facility] [address] [place], [place], [address] Phone: [phone]   Fax:  [phone]   Name: [name] MRN: [id number] Date of Birth: 05-19-1946

## 2020-08-10 NOTE — Patient Instructions (Signed)
  Please complete the assigned speech therapy homework prior to your next session and return it to the speech therapist at your next visit.  

## 2020-08-14 ENCOUNTER — Other Ambulatory Visit: Payer: Self-pay

## 2020-08-14 ENCOUNTER — Ambulatory Visit: Payer: Medicare HMO | Attending: Physical Medicine & Rehabilitation

## 2020-08-14 DIAGNOSIS — R1311 Dysphagia, oral phase: Secondary | ICD-10-CM | POA: Diagnosis not present

## 2020-08-14 DIAGNOSIS — R4701 Aphasia: Secondary | ICD-10-CM | POA: Diagnosis not present

## 2020-08-14 DIAGNOSIS — R41841 Cognitive communication deficit: Secondary | ICD-10-CM

## 2020-08-14 NOTE — Patient Instructions (Addendum)
  Each day, do the worksheets and either say and write 2 sentences using 2 words, or pick one transitive verb below to do a grid (V-NeST)  Transitive verbs Answer Break Bring [place] Help Like Love Meet Pay Play Read See Dickie La Stop Take Understand Write

## 2020-08-15 DIAGNOSIS — R69 Illness, unspecified: Secondary | ICD-10-CM | POA: Diagnosis not present

## 2020-08-15 NOTE — Therapy (Signed)
[facility] [address] [place], [place], [address] Phone: [phone]   Fax:  [phone]  Speech Language Pathology Treatment  Patient Details  Name: [name] MRN: [id number] Date of Birth: Apr 09, 1946 Referring Provider (SLP): Alysia Penna MD   Encounter Date: 08/14/2020   End of Session - 08/15/20 0836    Visit Number 17    Number of Visits 22    Date for SLP Re-Evaluation 09/22/20    SLP Start Time 8003    SLP Stop Time  1445    SLP Time Calculation (min) 40 min    Activity Tolerance Patient tolerated treatment well           Past Medical History:  Diagnosis Date  . Hepatitis C   . Hyperlipidemia   . Hypertension   . Osteoporosis   . Stroke ([place])   . Thrombocytosis ([place])     Past Surgical History:  Procedure Laterality Date  . IR ANGIO INTRA EXTRACRAN SEL INTERNAL CAROTID BILAT MOD SED  02/24/2020  . IR ANGIO VERTEBRAL SEL VERTEBRAL UNI R MOD SED  02/24/2020  . IR ANGIOGRAM SELECTIVE EACH ADDITIONAL VESSEL  02/24/2020  . IR IVC FILTER PLMT / S&I /IMG GUID/MOD SED  02/22/2020  . IR IVC FILTER RETRIEVAL / S&I /IMG GUID/MOD SED  06/27/2020  . IR RADIOLOGIST EVAL & MGMT  05/23/2020  . IR THROMBECT VENO MECH MOD SED  02/24/2020  . IR US GUIDE VASC ACCESS RIGHT  02/24/2020  . IR US GUIDE VASC ACCESS RIGHT  02/24/2020  . IR VENO SAGITTAL SINUS  02/24/2020  . IR VENO/JUGULAR RIGHT  02/24/2020  . RADIOLOGY WITH ANESTHESIA N/A 02/24/2020   Procedure: IR WITH ANESTHESIA;  Surgeon: Radiologist, Medication, MD;  Location: [facility];  Service: Radiology;  Laterality: N/A;    There were no vitals filed for this visit.   Subjective Assessment - 08/14/20 1406    Subjective "I tried to do some crazy things y'all gave me." Pt did not recall having son and DIL over Saturday  night.    Patient is accompained by: Family member   husband   Currently in Pain? No/denies                 ADULT SLP TREATMENT -  08/15/20 0001      General Information   Behavior/Cognition Alert;Cooperative;Pleasant mood;Requires cueing      Treatment Provided   Treatment provided Cognitive-Linquistic      Cognitive-Linquistic Treatment   Treatment focused on Cognition    Skilled Treatment Cognition (14 minutes): Pt took her thyroid med in PM instead of AM - husband moved meds to kitchen. Pt showing orientation to notebook with min A rarely. Pt navigated through her notebook with independence/extra time. (speech tx-individual) SLP guided pt through a VNeST task ("dry") - pt req'd max A for procedure - husband was independent after task and indicated he oculd lead pt through this task at home. Pt req'd mod A occasionally for 3 subjects and 3 objects, faded to min-mod A occasionally. She req'd mod A occasionally with sentence expansion.      Assessment / Recommendations / Plan   Plan Continue with current plan of care;Goals updated   added goal for husband assistiing pt at home     Progression Toward Goals   Progression toward goals Progressing toward goals            SLP Education - 08/15/20 0835    Education Details rationale  for VNeST, procedure for VNeST    Person(s) Educated Patient;Spouse    Methods Explanation;Demonstration;Handout    Comprehension Verbalized understanding;Verbal cues required;Need further instruction;Returned demonstration            SLP Short Term Goals - 07/27/20 1419      SLP SHORT TERM GOAL #1   Title pt will complete simple-mod complex naming tasks with 80% success x2 sessions    Period --   or 9 total sessions, for all STGs   Status Not Met      SLP SHORT TERM GOAL #2   Title pt will complete any further testing for ST goal writing in the first 4 sessions    Period --   or 5 total sessions   Status Achieved      SLP SHORT TERM GOAL #3   Title pt will bring a memory notebook/binder/planner to 4 therapy sessions    Baseline 07-05-20, 07-06-20, 07-10-20 07/13/20    Status  Achieved            SLP Long Term Goals - 08/15/20 0837      SLP LONG TERM GOAL #1   Title pt will demo appropriate use of her binder/planner in 4 sessions    Baseline 07-27-20, 07/31/20, 08-03-20, 08-14-20    Period --   or 22 total sessions, for all LTGs   Status Achieved      SLP LONG TERM GOAL #2   Title pt will use anomic compensations for functional communication in 10 minutes mod complex conversation in 3 sessions    Time 3    Period Weeks    Status On-going      SLP LONG TERM GOAL #3   Title in order to facilitate pt's expressive language, pt's husband will demonstrate comfort/independence in guiding pt through [place] and/or semantic feature analysis tasks    Time 3    Period [place] - 08/15/20 0836    Clinical Impression Statement Pt presents today with contiued anomia, and with cognitive communication deficits in attention, awareness, and higher level cognitive functions. Pt uses her memory notebook/planner more consistently now and she and husband states this is assisting pt for more independence. See "skilled intervention" for more details. Skilled ST is recommended to improve pt's cognitive linguisitic ability, assist husband in his cueing methods and understanding of pt's deficits, and improve [place];s skills to as close to her PLOF as possible.    Speech Therapy Frequency 2x / week    Duration --   4 weeks or 22 total sessions   Treatment/Interventions Environmental controls;Functional tasks;Compensatory techniques;SLP instruction and feedback;Cueing hierarchy;Language facilitation;Cognitive reorganization;Compensatory strategies;Internal/external aids;Patient/family education    Potential to Achieve Goals Good    Potential Considerations Severity of impairments    Consulted and Agree with Plan of Care Patient           Patient will benefit from skilled therapeutic intervention in order to improve the following deficits and  impairments:   Aphasia  Cognitive communication deficit  Oral phase dysphagia    Problem List Patient Active Problem List   Diagnosis Date Noted  . Cognitive deficit, post-stroke 07/13/2020  . Cerebral venous sinus thrombosis 03/30/2020  . Presence of IVC filter   . Benign essential HTN   . Peripheral edema   . Impulsive   . Hypoalbuminemia due to protein-calorie malnutrition ([place])   . Anemia   . Prediabetes   .  Steroid-induced hyperglycemia   . Embolic stroke ([place]) 68/61/0424  . Dysphagia   . Vascular headache   . Essential thrombocytosis ([place])   . Venous thrombosis   . Polycythemia   . Encounter for central line care   . Nontraumatic subcortical hemorrhage of right cerebral hemisphere ([place])   . Abnormal CT of the chest   . Atrial flutter with rapid ventricular response ([place])   . Pulmonary embolus ([place]) 02/22/2020  . Polycythemia vera ([place]) 02/17/2020  . Likely Cerebral amyloid angiopathy ([place]) 02/17/2020  . IVH (intraventricular hemorrhage) ([place]) 02/14/2020  . TIA (transient ischemic attack) 08/17/2014  . Acute sinusitis 08/16/2014  . Headache 08/15/2014  . Hypothyroidism 08/15/2014  . Hepatitis C   . Hyperlipidemia   . Hypertension     Yitzchok Carriger ,[place], CCC-SLP  08/15/2020, 8:39 AM  [facility] [address] [place], [place], [address] Phone: [phone]   Fax:  [phone]   Name: LIANDRA MENDIA MRN: [id number] Date of Birth: 06/22/1946

## 2020-08-17 ENCOUNTER — Ambulatory Visit: Payer: Medicare HMO | Admitting: Speech Pathology

## 2020-08-17 ENCOUNTER — Other Ambulatory Visit: Payer: Self-pay

## 2020-08-17 DIAGNOSIS — R1311 Dysphagia, oral phase: Secondary | ICD-10-CM | POA: Diagnosis not present

## 2020-08-17 DIAGNOSIS — R41841 Cognitive communication deficit: Secondary | ICD-10-CM | POA: Diagnosis not present

## 2020-08-17 DIAGNOSIS — R4701 Aphasia: Secondary | ICD-10-CM | POA: Diagnosis not present

## 2020-08-18 NOTE — Therapy (Signed)
[facility] [address] [place], [place], [address] Phone: [phone]   Fax:  [phone]  Speech Language Pathology Treatment  Patient Details  Name: [name] MRN: [id number] Date of Birth: 1945-11-21 Referring Provider (SLP): Alysia Penna MD   Encounter Date: 08/17/2020   End of Session - 08/18/20 0803    Visit Number 18    Number of Visits 22    Date for SLP Re-Evaluation 09/22/20    SLP Start Time 9528    SLP Stop Time  1520    SLP Time Calculation (min) 33 min    Activity Tolerance Patient tolerated treatment well           Past Medical History:  Diagnosis Date  . Hepatitis C   . Hyperlipidemia   . Hypertension   . Osteoporosis   . Stroke ([place])   . Thrombocytosis ([place])     Past Surgical History:  Procedure Laterality Date  . IR ANGIO INTRA EXTRACRAN SEL INTERNAL CAROTID BILAT MOD SED  02/24/2020  . IR ANGIO VERTEBRAL SEL VERTEBRAL UNI R MOD SED  02/24/2020  . IR ANGIOGRAM SELECTIVE EACH ADDITIONAL VESSEL  02/24/2020  . IR IVC FILTER PLMT / S&I /IMG GUID/MOD SED  02/22/2020  . IR IVC FILTER RETRIEVAL / S&I /IMG GUID/MOD SED  06/27/2020  . IR RADIOLOGIST EVAL & MGMT  05/23/2020  . IR THROMBECT VENO MECH MOD SED  02/24/2020  . IR US GUIDE VASC ACCESS RIGHT  02/24/2020  . IR US GUIDE VASC ACCESS RIGHT  02/24/2020  . IR VENO SAGITTAL SINUS  02/24/2020  . IR VENO/JUGULAR RIGHT  02/24/2020  . RADIOLOGY WITH ANESTHESIA N/A 02/24/2020   Procedure: IR WITH ANESTHESIA;  Surgeon: Radiologist, Medication, MD;  Location: [facility];  Service: Radiology;  Laterality: N/A;    There were no vitals filed for this visit.   Subjective Assessment - 08/18/20 0757    Subjective "This crazy stuff"    Patient is accompained by: Family member   husband   Currently in Pain? No/denies                 ADULT SLP TREATMENT - 08/18/20 0757      General Information   Behavior/Cognition Alert;Cooperative;Pleasant  mood;Requires cueing      Treatment Provided   Treatment provided Cognitive-Linquistic      Pain Assessment   Pain Assessment No/denies pain      Cognitive-Linquistic Treatment   Treatment focused on Aphasia;Cognition;Patient/family/caregiver education    Skilled Treatment Patient showed SLP sentences she wrote in notebook for "VNeST" activity. Did not use chart format, instead wrote several long sentences using only one subject and one object. In some cases such as with the word "answer", the stimulus was not used as a verb but as a noun. SLP created a chart template to assist pt and husband in structuring this task at home. Pt given clear and direct instructions to generate 3 SIMPLE sentences using the same verb and 3 different subjects, objects. Patient required usual mod cues to follow template and to generate simple sentences. Spouse observed SLP assisting patient and began offering some appropriate cues near end of session; anticipate he will require ongoing training in order to assist pt with this task.       Assessment / Recommendations / Plan   Plan Continue with current plan of care      Progression Toward Goals   Progression toward goals Progressing toward goals  SLP Education - 08/18/20 0803    Education Details procedure/format/how to cue pt for VNeST activity    Person(s) Educated Patient;Spouse    Methods Explanation    Comprehension Verbalized understanding            SLP Short Term Goals - 07/27/20 1419      SLP SHORT TERM GOAL #1   Title pt will complete simple-mod complex naming tasks with 80% success x2 sessions    Period --   or 9 total sessions, for all STGs   Status Not Met      SLP SHORT TERM GOAL #2   Title pt will complete any further testing for ST goal writing in the first 4 sessions    Period --   or 5 total sessions   Status Achieved      SLP SHORT TERM GOAL #3   Title pt will bring a memory notebook/binder/planner to 4 therapy  sessions    Baseline 07-05-20, 07-06-20, 07-10-20 07/13/20    Status Achieved            SLP Long Term Goals - 08/18/20 0804      SLP LONG TERM GOAL #1   Title pt will demo appropriate use of her binder/planner in 4 sessions    Baseline 07-27-20, 07/31/20, 08-03-20, 08-14-20    Period --   or 22 total sessions, for all LTGs   Status Achieved      SLP LONG TERM GOAL #2   Title pt will use anomic compensations for functional communication in 10 minutes mod complex conversation in 3 sessions    Time 3    Period Weeks    Status On-going      SLP LONG TERM GOAL #3   Title in order to facilitate pt's expressive language, pt's husband will demonstrate comfort/independence in guiding pt through [place] and/or semantic feature analysis tasks    Time 3    Period Weeks    Status On-going            Plan - 08/18/20 0804    Clinical Impression Statement Pt presents today with contiued anomia, and with cognitive communication deficits in attention, awareness, and higher level cognitive functions. Pt uses her memory notebook/planner more consistently now and she and husband states this is assisting pt for more independence. See "skilled intervention" for more details. Skilled ST is recommended to improve pt's cognitive linguisitic ability, assist husband in his cueing methods and understanding of pt's deficits, and improve [place];s skills to as close to her PLOF as possible.    Speech Therapy Frequency 2x / week    Duration --   4 weeks or 22 total sessions   Treatment/Interventions Environmental controls;Functional tasks;Compensatory techniques;SLP instruction and feedback;Cueing hierarchy;Language facilitation;Cognitive reorganization;Compensatory strategies;Internal/external aids;Patient/family education    Potential to Achieve Goals Good    Potential Considerations Severity of impairments    Consulted and Agree with Plan of Care Patient           Patient will benefit from skilled therapeutic  intervention in order to improve the following deficits and impairments:   Aphasia  Cognitive communication deficit    Problem List Patient Active Problem List   Diagnosis Date Noted  . Cognitive deficit, post-stroke 07/13/2020  . Cerebral venous sinus thrombosis 03/30/2020  . Presence of IVC filter   . Benign essential HTN   . Peripheral edema   . Impulsive   . Hypoalbuminemia due to protein-calorie malnutrition ([place])   . Anemia   .  Prediabetes   . Steroid-induced hyperglycemia   . Embolic stroke ([place]) 99/83/3825  . Dysphagia   . Vascular headache   . Essential thrombocytosis ([place])   . Venous thrombosis   . Polycythemia   . Encounter for central line care   . Nontraumatic subcortical hemorrhage of right cerebral hemisphere ([place])   . Abnormal CT of the chest   . Atrial flutter with rapid ventricular response ([place])   . Pulmonary embolus ([place]) 02/22/2020  . Polycythemia vera ([place]) 02/17/2020  . Likely Cerebral amyloid angiopathy ([place]) 02/17/2020  . IVH (intraventricular hemorrhage) ([place]) 02/14/2020  . TIA (transient ischemic attack) 08/17/2014  . Acute sinusitis 08/16/2014  . Headache 08/15/2014  . Hypothyroidism 08/15/2014  . Hepatitis C   . Hyperlipidemia   . Hypertension    [name], [name], [name] Speech-Language Pathologist  Aliene Altes 08/18/2020, 8:05 AM  [facility] [address] [place], [place], [address] Phone: [phone]   Fax:  [phone]   Name: CALIA NAPP MRN: [id number] Date of Birth: 1946/05/26

## 2020-08-21 ENCOUNTER — Ambulatory Visit: Payer: Medicare HMO

## 2020-08-21 ENCOUNTER — Other Ambulatory Visit: Payer: Self-pay

## 2020-08-21 DIAGNOSIS — R4701 Aphasia: Secondary | ICD-10-CM

## 2020-08-21 DIAGNOSIS — R41841 Cognitive communication deficit: Secondary | ICD-10-CM

## 2020-08-21 DIAGNOSIS — R1311 Dysphagia, oral phase: Secondary | ICD-10-CM | POA: Diagnosis not present

## 2020-08-21 NOTE — Therapy (Signed)
[facility] [address] [place], [place], [address] Phone: [phone]   Fax:  [phone]  Speech Language Pathology Treatment  Patient Details  Name: [name] MRN: [id number] Date of Birth: 1946/01/10 Referring Provider (SLP): Alysia Penna MD   Encounter Date: 08/21/2020   End of Session - 08/21/20 1737    Visit Number 19    Number of Visits 22    Date for SLP Re-Evaluation 09/22/20    SLP Start Time 8588    SLP Stop Time  1445    SLP Time Calculation (min) 40 min    Activity Tolerance Patient tolerated treatment well           Past Medical History:  Diagnosis Date  . Hepatitis C   . Hyperlipidemia   . Hypertension   . Osteoporosis   . Stroke ([place])   . Thrombocytosis ([place])     Past Surgical History:  Procedure Laterality Date  . IR ANGIO INTRA EXTRACRAN SEL INTERNAL CAROTID BILAT MOD SED  02/24/2020  . IR ANGIO VERTEBRAL SEL VERTEBRAL UNI R MOD SED  02/24/2020  . IR ANGIOGRAM SELECTIVE EACH ADDITIONAL VESSEL  02/24/2020  . IR IVC FILTER PLMT / S&I /IMG GUID/MOD SED  02/22/2020  . IR IVC FILTER RETRIEVAL / S&I /IMG GUID/MOD SED  06/27/2020  . IR RADIOLOGIST EVAL & MGMT  05/23/2020  . IR THROMBECT VENO MECH MOD SED  02/24/2020  . IR US GUIDE VASC ACCESS RIGHT  02/24/2020  . IR US GUIDE VASC ACCESS RIGHT  02/24/2020  . IR VENO SAGITTAL SINUS  02/24/2020  . IR VENO/JUGULAR RIGHT  02/24/2020  . RADIOLOGY WITH ANESTHESIA N/A 02/24/2020   Procedure: IR WITH ANESTHESIA;  Surgeon: Radiologist, Medication, MD;  Location: [facility];  Service: Radiology;  Laterality: N/A;    There were no vitals filed for this visit.   Subjective Assessment - 08/21/20 1407    Subjective Pt arrived with corectly completed VNEST tasks.    Patient is accompained by: Family member   [name] - husband   Currently in Pain? No/denies                 ADULT SLP TREATMENT - 08/21/20 1727      General Information    Behavior/Cognition Alert;Cooperative;Pleasant mood;Requires cueing      Treatment Provided   Treatment provided Cognitive-Linquistic      Cognitive-Linquistic Treatment   Treatment focused on Aphasia;Cognition    Skilled Treatment Pt looked in her planner to see her next appointment time/date. SLP reviewed with pt and husband that next week would likely be planned for pt's last ST. Pt arrived with VNEST completed correctly and SLP demonstrated correct cueing for pt completion for this task. Amount of cueing for word choice did not vary from the first rep to the last rep, however pt's independence with the procedure did appear to get easier on teh second rep. Husband stated he was still concerned about pt's memory - gave example if pt does not use her compensations she does not remember her meds, now ~4 months into her new med regimen - SLP told pt/husband that this was likely going to be pt's new normal re:her meds and that is the very reason we put into place the compensation measures. [place] and husband expressed understanding. Pt husband told SLP he had to assist pt with subjects and objects, rarely, and usualy with expanding her sentence as pt would add things not in the simpler sentecnes.  Assessment / Recommendations / Plan   Plan Continue with current plan of care      Progression Toward Goals   Progression toward goals --   procedural memory assists pt's event memory           SLP Education - 08/21/20 1736    Education Details procedure for VNEST, memory is likely more pt's new normal, needs compensations    Person(s) Educated Patient    Methods Explanation    Comprehension Verbalized understanding            SLP Short Term Goals - 07/27/20 1419      SLP SHORT TERM GOAL #1   Title pt will complete simple-mod complex naming tasks with 80% success x2 sessions    Period --   or 9 total sessions, for all STGs   Status Not Met      SLP SHORT TERM GOAL #2   Title pt will  complete any further testing for ST goal writing in the first 4 sessions    Period --   or 5 total sessions   Status Achieved      SLP SHORT TERM GOAL #3   Title pt will bring a memory notebook/binder/planner to 4 therapy sessions    Baseline 07-05-20, 07-06-20, 07-10-20 07/13/20    Status Achieved            SLP Long Term Goals - 08/21/20 1739      SLP LONG TERM GOAL #1   Title pt will demo appropriate use of her binder/planner in 4 sessions    Baseline 07-27-20, 07/31/20, 08-03-20, 08-14-20    Period --   or 22 total sessions, for all LTGs   Status Achieved      SLP LONG TERM GOAL #2   Title pt will use anomic compensations for functional communication in 10 minutes mod complex conversation in 3 sessions    Time 2    Period Weeks    Status On-going      SLP LONG TERM GOAL #3   Title in order to facilitate pt's expressive language, pt's husband will demonstrate comfort/independence in guiding pt through [place] and/or semantic feature analysis tasks    Time 2    Period Weeks    Status On-going            Plan - 08/21/20 1738    Clinical Impression Statement Pt presents today with contiued anomia, and with cognitive communication deficits in memory, attention, awareness, and higher level cognitive functions. Pt uses her memory notebook/planner well now with occasional extra time due to memory of where items are, See "skilled intervention" for more details. Skilled ST is recommended for 2-3 more visits to improve pt's cognitive linguisitic ability, assist husband in his cueing methods and understanding of pt's deficits, and improve [place];s skills to as close to her PLOF as possible.    Speech Therapy Frequency 2x / week    Duration --   4 weeks or 22 total sessions   Treatment/Interventions Environmental controls;Functional tasks;Compensatory techniques;SLP instruction and feedback;Cueing hierarchy;Language facilitation;Cognitive reorganization;Compensatory strategies;Internal/external  aids;Patient/family education    Potential to Achieve Goals Good    Potential Considerations Severity of impairments    Consulted and Agree with Plan of Care Patient           Patient will benefit from skilled therapeutic intervention in order to improve the following deficits and impairments:   Aphasia  Cognitive communication deficit    Problem List Patient Active Problem List  Diagnosis Date Noted  . Cognitive deficit, post-stroke 07/13/2020  . Cerebral venous sinus thrombosis 03/30/2020  . Presence of IVC filter   . Benign essential HTN   . Peripheral edema   . Impulsive   . Hypoalbuminemia due to protein-calorie malnutrition ([place])   . Anemia   . Prediabetes   . Steroid-induced hyperglycemia   . Embolic stroke ([place]) 96/29/5284  . Dysphagia   . Vascular headache   . Essential thrombocytosis ([place])   . Venous thrombosis   . Polycythemia   . Encounter for central line care   . Nontraumatic subcortical hemorrhage of right cerebral hemisphere ([place])   . Abnormal CT of the chest   . Atrial flutter with rapid ventricular response ([place])   . Pulmonary embolus ([place]) 02/22/2020  . Polycythemia vera ([place]) 02/17/2020  . Likely Cerebral amyloid angiopathy ([place]) 02/17/2020  . IVH (intraventricular hemorrhage) ([place]) 02/14/2020  . TIA (transient ischemic attack) 08/17/2014  . Acute sinusitis 08/16/2014  . Headache 08/15/2014  . Hypothyroidism 08/15/2014  . Hepatitis C   . Hyperlipidemia   . Hypertension     [place] ,[place], [place]  08/21/2020, 5:41 PM  [facility] [address] [place], [place], [address] Phone: [phone]   Fax:  [phone]   Name: AMERI CAHOON MRN: [id number] Date of Birth: Apr 25, 1946

## 2020-08-23 ENCOUNTER — Other Ambulatory Visit: Payer: Self-pay

## 2020-08-23 ENCOUNTER — Ambulatory Visit: Payer: Medicare HMO

## 2020-08-23 DIAGNOSIS — R41841 Cognitive communication deficit: Secondary | ICD-10-CM

## 2020-08-23 DIAGNOSIS — R4701 Aphasia: Secondary | ICD-10-CM

## 2020-08-23 DIAGNOSIS — R1311 Dysphagia, oral phase: Secondary | ICD-10-CM | POA: Diagnosis not present

## 2020-08-23 NOTE — Therapy (Signed)
[facility] [address] [place], [place], [address] Phone: [phone]   Fax:  [phone]  Speech Language Pathology Treatment/Progress note  Patient Details  Name: [name] MRN: [id number] Date of Birth: June 12, 1946 Referring Provider (SLP): Alysia Penna MD   Encounter Date: 08/23/2020   End of Session - 08/23/20 1643    Visit Number 20    Number of Visits 22    Date for SLP Re-Evaluation 09/22/20    SLP Start Time 1320    SLP Stop Time  1400    SLP Time Calculation (min) 40 min    Activity Tolerance Patient tolerated treatment well           Past Medical History:  Diagnosis Date  . Hepatitis C   . Hyperlipidemia   . Hypertension   . Osteoporosis   . Stroke ([facility])   . Thrombocytosis ([facility])     Past Surgical History:  Procedure Laterality Date  . IR ANGIO INTRA EXTRACRAN SEL INTERNAL CAROTID BILAT MOD SED  02/24/2020  . IR ANGIO VERTEBRAL SEL VERTEBRAL UNI R MOD SED  02/24/2020  . IR ANGIOGRAM SELECTIVE EACH ADDITIONAL VESSEL  02/24/2020  . IR IVC FILTER PLMT / S&I /IMG GUID/MOD SED  02/22/2020  . IR IVC FILTER RETRIEVAL / S&I /IMG GUID/MOD SED  06/27/2020  . IR RADIOLOGIST EVAL & MGMT  05/23/2020  . IR THROMBECT VENO MECH MOD SED  02/24/2020  . IR US GUIDE VASC ACCESS RIGHT  02/24/2020  . IR US GUIDE VASC ACCESS RIGHT  02/24/2020  . IR VENO SAGITTAL SINUS  02/24/2020  . IR VENO/JUGULAR RIGHT  02/24/2020  . RADIOLOGY WITH ANESTHESIA N/A 02/24/2020   Procedure: IR WITH ANESTHESIA;  Surgeon: Radiologist, Medication, MD;  Location: [facility];  Service: Radiology;  Laterality: N/A;    There were no vitals filed for this visit.   Subjective Assessment - 08/23/20 1321    Subjective Pt arrived today with VNEST completed.    Patient is accompained by: Family member   [name]   Currently in Pain? No/denies                 ADULT SLP TREATMENT - 08/23/20 1350      General Information    Behavior/Cognition Alert;Cooperative;Pleasant mood;Requires cueing      Treatment Provided   Treatment provided Cognitive-Linquistic      Cognitive-Linquistic Treatment   Treatment focused on Aphasia;Cognition    Skilled Treatment Pt looked at her planner as soon as SLP and [name] began talking about her schedule. SLP worked with pt with the procedure for VNEST - pt req'd mod cues usually faded to min-mod cues occasionally for procedure. For some items pt req'd SLP cues -phonemic cues proved more successful. Pt cont to have diffiulty with consistently remembering how to proceed through completing a VNEST correctly. SLP explained to pt and husband that with as many as pt has completed, it is peculiar that she still has issues with this and suggested that someting was occurring neurolgoically prior to her CVA.  Pt/husband voiced understanding. Husband noted today to cue pt appropriately for anomia x2, as well as suggest that he check pt's VNEST section by section instead of the entire thing at one time.       Assessment / Recommendations / Plan   Plan Continue with current plan of care      Progression Toward Goals   Progression toward goals Progressing toward goals   training husband to  cue pt most effectively           SLP Education - 08/23/20 1642    Education Details VNEST procedure, pt likely had some kind of neurological difference premorbidly    Person(s) Educated Patient;Spouse    Methods Explanation    Comprehension Verbalized understanding            SLP Short Term Goals - 07/27/20 1419      SLP SHORT TERM GOAL #1   Title pt will complete simple-mod complex naming tasks with 80% success x2 sessions    Period --   or 9 total sessions, for all STGs   Status Not Met      SLP SHORT TERM GOAL #2   Title pt will complete any further testing for ST goal writing in the first 4 sessions    Period --   or 5 total sessions   Status Achieved      SLP SHORT TERM GOAL #3   Title pt  will bring a memory notebook/binder/planner to 4 therapy sessions    Baseline 07-05-20, 07-06-20, 07-10-20 07/13/20    Status Achieved            SLP Long Term Goals - 08/23/20 1646      SLP LONG TERM GOAL #1   Title pt will demo appropriate use of her binder/planner in 4 sessions    Baseline 07-27-20, 07/31/20, 08-03-20, 08-14-20    Period --   or 22 total sessions, for all LTGs   Status Achieved      SLP LONG TERM GOAL #2   Title pt will use anomic compensations for functional communication in 10 minutes mod complex conversation in 3 sessions    Time 2    Period Weeks    Status On-going      SLP LONG TERM GOAL #3   Title in order to facilitate pt's expressive language, pt's husband will demonstrate comfort/independence in guiding pt through [place] and/or semantic feature analysis tasks in 2 sessions    Baseline 08-23-20    Time 2    Status Revised            Plan - 08/23/20 1643    Clinical Impression Statement Pt presents today with contiued anomia, and with cognitive communication deficits in memory, attention, awareness, and higher level cognitive functions. Pt cont to use memory notebook/planner successfully much of the time with extra time, due to memory of where items are, See "skilled intervention" for more details. Skilled ST is recommended for 2-3 more visits to improve pt's cognitive linguisitic ability, assist husband in his cueing methods and understanding of pt's deficits, and improve Eila;s skills to as close to her PLOF as possible.    Speech Therapy Frequency 2x / week    Duration --   4 weeks or 22 total sessions   Treatment/Interventions Environmental controls;Functional tasks;Compensatory techniques;SLP instruction and feedback;Cueing hierarchy;Language facilitation;Cognitive reorganization;Compensatory strategies;Internal/external aids;Patient/family education    Potential to Achieve Goals Good    Potential Considerations Severity of impairments    Consulted and  Agree with Plan of Care Patient           Patient will benefit from skilled therapeutic intervention in order to improve the following deficits and impairments:   Aphasia  Cognitive communication deficit   Speech Therapy Progress Note  Dates of Reporting Period: 07-24-20 to present  Subjective Statement: Pt seen for 20 sessions focusing on aphasia and cognitive-linguistics  Objective: Pt appears to improve with VNEST tasks  however carryover to conversation is not evident.  Goal Update: see above. Husband is beginning to cue pt appropriately more often.  Plan: see pt for two more sessions primarily to show husband what cues work best with pt.   Reason Skilled Services are Required: cont to train husband in [place] completion and how best to cue pt.   Problem List Patient Active Problem List   Diagnosis Date Noted  . Cognitive deficit, post-stroke 07/13/2020  . Cerebral venous sinus thrombosis 03/30/2020  . Presence of IVC filter   . Benign essential HTN   . Peripheral edema   . Impulsive   . Hypoalbuminemia due to protein-calorie malnutrition ([facility])   . Anemia   . Prediabetes   . Steroid-induced hyperglycemia   . Embolic stroke ([facility]) 19/11/2222  . Dysphagia   . Vascular headache   . Essential thrombocytosis ([facility])   . Venous thrombosis   . Polycythemia   . Encounter for central line care   . Nontraumatic subcortical hemorrhage of right cerebral hemisphere ([facility])   . Abnormal CT of the chest   . Atrial flutter with rapid ventricular response ([facility])   . Pulmonary embolus ([facility]) 02/22/2020  . Polycythemia vera ([facility]) 02/17/2020  . Likely Cerebral amyloid angiopathy ([facility]) 02/17/2020  . IVH (intraventricular hemorrhage) ([facility]) 02/14/2020  . TIA (transient ischemic attack) 08/17/2014  . Acute sinusitis 08/16/2014  . Headache 08/15/2014  . Hypothyroidism 08/15/2014  . Hepatitis C   . Hyperlipidemia   . Hypertension     [facility] ,[place], [place]  08/23/2020, 4:47  PM  [facility] [address] [place], [place], [address] Phone: [phone]   Fax:  [phone]   Name: [name] MRN: [id number] Date of Birth: [birth year]

## 2020-08-28 ENCOUNTER — Ambulatory Visit: Payer: Medicare HMO

## 2020-08-28 ENCOUNTER — Other Ambulatory Visit: Payer: Self-pay

## 2020-08-28 DIAGNOSIS — R1311 Dysphagia, oral phase: Secondary | ICD-10-CM | POA: Diagnosis not present

## 2020-08-28 DIAGNOSIS — R41841 Cognitive communication deficit: Secondary | ICD-10-CM | POA: Diagnosis not present

## 2020-08-28 DIAGNOSIS — R4701 Aphasia: Secondary | ICD-10-CM

## 2020-08-28 NOTE — Therapy (Signed)
[facility] [address] [place], [place], [address] Phone: [phone]   Fax:  [phone]  Speech Language Pathology Treatment  Patient Details  Name: [name] MRN: [id number] Date of Birth: [birth year] Referring Provider (SLP): Alysia Penna MD   Encounter Date: 08/28/2020   End of Session - 08/28/20 1506    Visit Number 21    Number of Visits 22    Date for SLP Re-Evaluation 09/22/20    SLP Start Time 1404    SLP Stop Time  1445    SLP Time Calculation (min) 41 min    Activity Tolerance Patient tolerated treatment well           Past Medical History:  Diagnosis Date  . Hepatitis C   . Hyperlipidemia   . Hypertension   . Osteoporosis   . Stroke ([facility])   . Thrombocytosis ([facility])     Past Surgical History:  Procedure Laterality Date  . IR ANGIO INTRA EXTRACRAN SEL INTERNAL CAROTID BILAT MOD SED  02/24/2020  . IR ANGIO VERTEBRAL SEL VERTEBRAL UNI R MOD SED  02/24/2020  . IR ANGIOGRAM SELECTIVE EACH ADDITIONAL VESSEL  02/24/2020  . IR IVC FILTER PLMT / S&I /IMG GUID/MOD SED  02/22/2020  . IR IVC FILTER RETRIEVAL / S&I /IMG GUID/MOD SED  06/27/2020  . IR RADIOLOGIST EVAL & MGMT  05/23/2020  . IR THROMBECT VENO MECH MOD SED  02/24/2020  . IR US GUIDE VASC ACCESS RIGHT  02/24/2020  . IR US GUIDE VASC ACCESS RIGHT  02/24/2020  . IR VENO SAGITTAL SINUS  02/24/2020  . IR VENO/JUGULAR RIGHT  02/24/2020  . RADIOLOGY WITH ANESTHESIA N/A 02/24/2020   Procedure: IR WITH ANESTHESIA;  Surgeon: Radiologist, Medication, MD;  Location: [facility];  Service: Radiology;  Laterality: N/A;    There were no vitals filed for this visit.   Subjective Assessment - 08/28/20 1410    Subjective "I didn't do much (over the weekend). what's today - it's the 18th."    Patient is accompained by: Family member   husband   Currently in Pain? No/denies                 ADULT SLP TREATMENT - 08/28/20 1411      General  Information   Behavior/Cognition Alert;Cooperative;Pleasant mood;Requires cueing      Treatment Provided   Treatment provided Cognitive-Linquistic      Cognitive-Linquistic Treatment   Treatment focused on Aphasia;Cognition    Skilled Treatment Pt had difficulty generating restaurant names in telling SLP what she did over the weekend, so SLP worked with pt and husband to have her make a list of restaurants she and husband frequent. Husband noted to cue pt appropirately with semantic cues. Pt req'd consistent mod-max cues for names of familiar restaurants. SLP told pt to attempt to name restaurant on her own, then rely on husband for cues and use her list as a last resort.      Assessment / Recommendations / Plan   Plan Continue with current plan of care      Progression Toward Goals   Progression toward goals Progressing toward goals              SLP Short Term Goals - 07/27/20 1419      SLP SHORT TERM GOAL #1   Title pt will complete simple-mod complex naming tasks with 80% success x2 sessions    Period --   or 9 total sessions, for  all STGs   Status Not Met      SLP SHORT TERM GOAL #2   Title pt will complete any further testing for ST goal writing in the first 4 sessions    Period --   or 5 total sessions   Status Achieved      SLP SHORT TERM GOAL #3   Title pt will bring a memory notebook/binder/planner to 4 therapy sessions    Baseline 07-05-20, 07-06-20, 07-10-20 07/13/20    Status Achieved            SLP Long Term Goals - 08/28/20 1507      SLP LONG TERM GOAL #1   Title pt will demo appropriate use of her binder/planner in 4 sessions    Baseline 07-27-20, 07/31/20, 08-03-20, 08-14-20    Period --   or 22 total sessions, for all LTGs   Status Achieved      SLP LONG TERM GOAL #2   Title pt will use anomic compensations for functional communication in 10 minutes mod complex conversation in 3 sessions    Time 1    Period Weeks    Status On-going      SLP LONG TERM  GOAL #3   Title in order to facilitate pt's expressive language, pt's husband will demonstrate comfort/independence in guiding pt through [place] and/or semantic feature analysis tasks in 2 sessions    Baseline 08-23-20    Time 1    Status Revised            Plan - 08/28/20 1506    Clinical Impression Statement Pt presents today with contiued anomia, and with cognitive communication deficits in memory, attention, awareness, and higher level cognitive functions. Pt used her memory notebook/planner today telling SLP what she did over the weekend and ID'ing corect current day. See "skilled intervention" for more details. Skilled ST is recommended for 2-3 more visits to improve pt's cognitive linguisitic ability, assist husband in his cueing methods and understanding of pt's deficits, and improve [place];s skills to as close to her PLOF as possible.    Speech Therapy Frequency 2x / week    Duration --   4 weeks or 22 total sessions   Treatment/Interventions Environmental controls;Functional tasks;Compensatory techniques;SLP instruction and feedback;Cueing hierarchy;Language facilitation;Cognitive reorganization;Compensatory strategies;Internal/external aids;Patient/family education    Potential to Achieve Goals Good    Potential Considerations Severity of impairments    Consulted and Agree with Plan of Care Patient           Patient will benefit from skilled therapeutic intervention in order to improve the following deficits and impairments:   Aphasia  Cognitive communication deficit    Problem List Patient Active Problem List   Diagnosis Date Noted  . Cognitive deficit, post-stroke 07/13/2020  . Cerebral venous sinus thrombosis 03/30/2020  . Presence of IVC filter   . Benign essential HTN   . Peripheral edema   . Impulsive   . Hypoalbuminemia due to protein-calorie malnutrition ([facility])   . Anemia   . Prediabetes   . Steroid-induced hyperglycemia   . Embolic stroke ([facility]) 50/27/7412   . Dysphagia   . Vascular headache   . Essential thrombocytosis ([facility])   . Venous thrombosis   . Polycythemia   . Encounter for central line care   . Nontraumatic subcortical hemorrhage of right cerebral hemisphere ([facility])   . Abnormal CT of the chest   . Atrial flutter with rapid ventricular response ([facility])   . Pulmonary embolus ([facility]) 02/22/2020  .  Polycythemia vera ([facility]) 02/17/2020  . Likely Cerebral amyloid angiopathy ([facility]) 02/17/2020  . IVH (intraventricular hemorrhage) ([facility]) 02/14/2020  . TIA (transient ischemic attack) 08/17/2014  . Acute sinusitis 08/16/2014  . Headache 08/15/2014  . Hypothyroidism 08/15/2014  . Hepatitis C   . Hyperlipidemia   . Hypertension     [facility] ,[place], [place]  08/28/2020, 3:10 PM  [facility] [address] [place], [place], [address] Phone: [phone]   Fax:  [phone]   Name: [name] MRN: [id number] Date of Birth: 03-02-1946

## 2020-08-30 ENCOUNTER — Ambulatory Visit: Payer: Medicare HMO | Admitting: Neurology

## 2020-08-30 ENCOUNTER — Other Ambulatory Visit: Payer: Self-pay

## 2020-08-30 ENCOUNTER — Encounter: Payer: Self-pay | Admitting: Neurology

## 2020-08-30 VITALS — BP 129/70 | HR 75 | Ht 62.0 in | Wt 143.0 lb

## 2020-08-30 DIAGNOSIS — G3184 Mild cognitive impairment, so stated: Secondary | ICD-10-CM

## 2020-08-30 DIAGNOSIS — Z86718 Personal history of other venous thrombosis and embolism: Secondary | ICD-10-CM

## 2020-08-30 DIAGNOSIS — Z8673 Personal history of transient ischemic attack (TIA), and cerebral infarction without residual deficits: Secondary | ICD-10-CM

## 2020-08-30 MED ORDER — PREVAGEN 10 MG PO CAPS: 1.0000 | ORAL_CAPSULE | Freq: Every morning | ORAL | 1 refills | Status: DC

## 2020-08-30 NOTE — Progress Notes (Signed)
[facility] [address] [place]. [place] [address] [phone]       OFFICE FOLLOW-UP NOTE  [name] Date of Birth:  1946-08-18 Medical Record Number:  [id number]   HPI: [name] is a 74 year old pleasant Caucasian lady seen today for initial office follow-up visit following hospital admission for intracerebral hemorrhage in April 2021.  She is accompanied by her husband.  History is obtained from them and review of electronic medical records and I personally reviewed imaging films in PACS. She is a 74 year old lady with past medical history of hypertension who presented to [facility] on 02/13/2020 with sudden onset of severe headache.  She was sitting in front of the computer and noticed that headache on the top of her head which is quite severe and persisted without relief.  She came to the emergency room where routine blood work revealed evidence of thrombocytosis and CT scan showed intraventricular hemorrhage.  This involves mostly the right lateral ventricle and there was changes of chronic small vessel disease.  She was kept in the intensive care unit where blood pressure was tightly controlled and follow-up CT scan showed stable appearance of the hemorrhage.  MRI scan was obtained which also showed stable intraventricular hemorrhage and severe changes of small vessel disease and multiple foci of microhemorrhages raising possibility of amyloid angiopathy versus hypertensive microangiopathy.  MRA of the brain showed no large vessel stenosis or occlusion.  2D echo showed normal ejection fraction.  LDL cholesterol 92 mg percent.  Hemoglobin A1c was 6.1.  Initially the etiology of the hemorrhage was thought to be hypertensive but subsequently patient's showed neurological worsening and she developed new left temporal occipital hemorrhage.  MR venogram on 02/25/2020 raise possibility of deep cerebral venous thrombosis.  Patient became obtunded and less  responsive.  She started hypertonic saline note back to the ICU however she did not get better.  Subsequently a cerebral catheter angiogram was done 03/01/2020 by Dr. Manson Passey and after consent she did mechanical thrombectomy with recanalization of the vein of [place]en and straight sinus with decreased clot burden in the torcula right transverse and sigmoid sinuses.  A residual nonocclusive clot was seen in the vein of Galen.  Patient had presented with thrombocytosis and she was diagnosed with polycythemia and followed by the hematology team and she underwent a pheresis.  She is started on hydroxyurea subsequently she developed thrombocytopenia and lab work suggested heparin-induced thrombocytopenia.  She was initially started on IV heparin for her venous sinus thrombosis but this was subsequently discontinued and she was started on IV argatroban which was continued till she was transferred to rehab and was able to swallow then she was switched to Pradaxa.  Patient spent 3 weeks in rehab and is gradually improved.  His speech and language are improved.  She is living at home with his husband.  She still has some word finding difficulties and occasional paraphasic errors but she is still getting ongoing speech therapy which is helpful.  She has no physical weakness and gait or balance problems.  Her platelet count is been followed regularly by Dr. Jana Hakim hematologist and she has recently been started back on hydroxyurea and her diagnosis is no change to essential thrombocytosis rather than polycythemia vera.  Patient's blood pressure has been running quite low with systolic in the 21H and 08M recently.  She is on Lasix, hydralazine, Norvasc.  She is having cognitive and memory issues but otherwise is doing fine. Update 08/30/2020 :  She returns for follow-up after last visit 4 months ago.  She is accompanied by her husband.  She states she is doing well.  Her speech has improved and speech therapy plans to  discharge her next week.  She is taking much better.  Her short-term memory is improving but is still not back to normal.  She has good days and bad days.  She has difficulty remembering names.  She is mostly independent in most activities for daily living with her husband does not live alone particularly when she is cooking.  Short-term memory remains poor.  She is tolerating Xarelto well without bleeding or bruising.  Blood pressures well controlled and she has discontinued Lasix and hydralazine.  Blood pressure today is 129/70.  She has no new complaints.  Her IVC filter was removed by Dr. Earleen Newport on 06/27/2020. ROS:   14 system review of systems is positive for memory loss, speech difficulties, headache, confusion and all other systems negative  PMH:  Past Medical History:  Diagnosis Date  . Hepatitis C   . Hyperlipidemia   . Hypertension   . Osteoporosis   . Stroke ([place])   . Thrombocytosis     Social History:  Social History   Socioeconomic History  . Marital status: Married    Spouse name: [name]  . Number of children: Not on file  . Years of education: Not on file  . Highest education level: Not on file  Occupational History  . Occupation: Retired  Tobacco Use  . Smoking status: Former Research scientist (life sciences)  . Smokeless tobacco: Never Used  Vaping Use  . Vaping Use: Never used  Substance and Sexual Activity  . Alcohol use: Yes    Alcohol/week: 1.0 standard drink    Types: 1 Glasses of wine per week    Comment: nightly  . Drug use: No  . Sexual activity: Not on file  Other Topics Concern  . Not on file  Social History Narrative   Lives with spouse   Right Handed   Drinks2-3 cups of caffeine daily   Social Determinants of Health   Financial Resource Strain:   . Difficulty of Paying Living Expenses: Not on file  Food Insecurity:   . Worried About Charity fundraiser in the Last Year: Not on file  . Ran Out of Food in the Last Year: Not on file  Transportation Needs:   . Lack of  Transportation (Medical): Not on file  . Lack of Transportation (Non-Medical): Not on file  Physical Activity:   . Days of Exercise per Week: Not on file  . Minutes of Exercise per Session: Not on file  Stress:   . Feeling of Stress : Not on file  Social Connections:   . Frequency of Communication with Friends and Family: Not on file  . Frequency of Social Gatherings with Friends and Family: Not on file  . Attends Religious Services: Not on file  . Active Member of Clubs or Organizations: Not on file  . Attends Archivist Meetings: Not on file  . Marital Status: Not on file  Intimate Partner Violence:   . Fear of Current or Ex-Partner: Not on file  . Emotionally Abused: Not on file  . Physically Abused: Not on file  . Sexually Abused: Not on file    Medications:   Current Outpatient Medications on File Prior to Visit  Medication Sig Dispense Refill  . acetaminophen (TYLENOL) 500 MG tablet Take 1,000 mg by mouth every 6 (  six) hours as needed for moderate pain or headache.    Marland Kitchen amLODipine (NORVASC) 10 MG tablet Take 10 mg by mouth daily.  30 tablet 0  . atorvastatin (LIPITOR) 10 MG tablet Take 1 tablet (10 mg total) by mouth daily. (Patient taking differently: Take 10 mg by mouth every evening. ) 30 tablet 0  . docusate sodium (COLACE) 100 MG capsule Take 100 mg by mouth daily.    . EUTHYROX 112 MCG tablet Take 112 mcg by mouth every morning.    . Fluticasone-Umeclidin-Vilant (TRELEGY ELLIPTA) 100-62.5-25 MCG/INH AEPB Inhale 1 puff into the lungs daily.    . hydroxyurea (HYDREA) 500 MG capsule Take 1 capsule (500 mg total) by mouth daily. May take with food to minimize GI side effects. 120 capsule 6  . losartan (COZAAR) 50 MG tablet Take 1 tablet (50 mg total) by mouth 2 (two) times daily. 60 tablet 0  . pantoprazole (PROTONIX) 40 MG tablet Take 1 tablet (40 mg total) by mouth daily. 30 tablet 0  . rivaroxaban (XARELTO) 20 MG TABS tablet Take 1 tablet (20 mg total) by mouth  daily with breakfast. 30 tablet 0  . senna-docusate (SENOKOT-S) 8.6-50 MG tablet Take 2 tablets by mouth 2 (two) times daily. 120 tablet 0  . QUEtiapine (SEROQUEL) 25 MG tablet Take 1 tablet (25 mg total) by mouth at bedtime as needed (for agitation). (Patient not taking: Reported on 08/30/2020) 15 tablet 0   No current facility-administered medications on file prior to visit.    Allergies:   Allergies  Allergen Reactions  . Heparin Other (See Comments)    4/15 HIT OD 2.553, SRA negative > treated as true HIT  . Amoxicillin-Pot Clavulanate Other (See Comments)    Unknown reaction   . Erythromycin Diarrhea, Itching and Other (See Comments)    Severe stomach pains    Physical Exam General: well developed, well nourished,\elderly Caucasian lady seated, in no evident distress Head: head normocephalic and atraumatic.  Neck: supple with no carotid or supraclavicular bruits Cardiovascular: regular rate and rhythm, no murmurs Musculoskeletal: no deformity Skin:  no rash/petichiae Vascular:  Normal pulses all extremities Vitals:   08/30/20 1409  BP: 129/70  Pulse: 75   Neurologic Exam Mental Status: Awake and fully alert. Oriented to place and time. Recent and remote memory poor. Attention span, concentration and fund of knowledge diminished. Mood and affect appropriate.  Diminished recall 0/3.  Able to name 8 animals that can walk on 4 legs.  Clock drawing 3/4.  Speech seems mostly fluent though slightly hesitant but no paraphasic errors.  Good naming repetition comprehension. Cranial Nerves: Fundoscopic exam not done. Pupils equal, briskly reactive to light. Extraocular movements full without nystagmus. Visual fields full to confrontation. Hearing intact. Facial sensation intact. Face, tongue, palate moves normally and symmetrically.  Motor: Normal bulk and tone. Normal strength in all tested extremity muscles. Sensory.: intact to touch ,pinprick .position and vibratory sensation.   Coordination: Rapid alternating movements normal in all extremities. Finger-to-nose and heel-to-shin performed accurately bilaterally. Gait and Station: Arises from chair without difficulty. Stance is normal. Gait demonstrates normal stride length and balance . Able to heel, toe and tandem walk with mild difficulty.  Reflexes: 1+ and symmetric. Toes downgoing.    ASSESSMENT: 74 year old Caucasian lady with intraventricular hemorrhage due to deep cerebral venous sinus thrombosis likely related to polycythemia treated with mechanical thrombectomy and prolonged hospital course complicated by bilateral pulmonary embolism, heparin-induced thrombocytopenia and anemia.  Patient is doing relatively well now  and her mild aphasia seems to have improved and cognitive impairment still persist.     PLAN: I had a long discussion with patient and her husband regarding her remote history of cerebral venous sinus thrombosis and resultant mild cognitive impairment management and memory disorder.  I recommend she start taking prevention 10 mg 1 capsule daily as well as increase participation in cognitively challenging activities like solving crossword puzzles, playing bridge and sodoku.  We also discussed memory compensation strategies.  She will continue Xarelto for her cerebral venous sinus thrombosis and return for follow-up in 6 months at that time we will discontinue that and check hypercoagulable panel labs and if negative put her on aspirin alone. Greater than 50% of time during this 25 minute visit was spent on counseling,explanation of diagnosis of , planning of further management of cerebral venous sinus thrombosis and mild cognitive impairment, discussion with patient and family and coordination of care Antony Contras, MD  [facility] [address] [place], [place] [address]  Phone [phone] Fax [phone] Note: This document was prepared with digital dictation and  possible smart phrase technology. Any transcriptional errors that result from this process are unintentional

## 2020-08-30 NOTE — Patient Instructions (Signed)
I had a long discussion with patient and her husband regarding her remote history of cerebral venous sinus thrombosis and resultant mild cognitive impairment management and memory disorder.  I recommend she start taking prevention 10 mg 1 capsule daily as well as increase participation in cognitively challenging activities like solving crossword puzzles, playing bridge and sodoku.  We also discussed memory compensation strategies.  She will continue Xarelto for her cerebral venous sinus thrombosis and return for follow-up in 6 months at that time we will discontinue that and check hypercoagulable panel labs and if negative put her on aspirin alone. Memory Compensation Strategies  1. Use "WARM" strategy.  W= write it down  A= associate it  R= repeat it  M= make a mental note  2.   You can keep a Social worker.  Use a 3-ring notebook with sections for the following: calendar, important names and phone numbers,  medications, doctors' names/phone numbers, lists/reminders, and a section to journal what you did  each day.   3.    Use a calendar to write appointments down.  4.    Write yourself a schedule for the day.  This can be placed on the calendar or in a separate section of the Memory Notebook.  Keeping a  regular schedule can help memory.  5.    Use medication organizer with sections for each day or morning/evening pills.  You may need help loading it  6.    Keep a basket, or pegboard by the door.  Place items that you need to take out with you in the basket or on the pegboard.  You may also want to  include a message board for reminders.  7.    Use sticky notes.  Place sticky notes with reminders in a place where the task is performed.  For example: " turn off the  stove" placed by the stove, "lock the door" placed on the door at eye level, " take your medications" on  the bathroom mirror or by the place where you normally take your medications.  8.    Use alarms/timers.  Use while  cooking to remind yourself to check on food or as a reminder to take your medicine, or as a  reminder to make a call, or as a reminder to perform another task, etc.

## 2020-09-01 ENCOUNTER — Ambulatory Visit: Payer: Medicare HMO

## 2020-09-01 ENCOUNTER — Other Ambulatory Visit: Payer: Self-pay

## 2020-09-01 DIAGNOSIS — R41841 Cognitive communication deficit: Secondary | ICD-10-CM

## 2020-09-01 DIAGNOSIS — R1311 Dysphagia, oral phase: Secondary | ICD-10-CM | POA: Diagnosis not present

## 2020-09-01 DIAGNOSIS — R4701 Aphasia: Secondary | ICD-10-CM | POA: Diagnosis not present

## 2020-09-01 NOTE — Therapy (Signed)
[facility] [address] [place], [place], [address] Phone: [phone]   Fax:  [phone]  Speech Language Pathology Treatment/Discharge Summary  Patient Details  Name: [name] MRN: [id number] Date of Birth: [birth year] Referring Provider (SLP): Alysia Penna MD   Encounter Date: 09/01/2020   End of Session - 09/01/20 1332    Visit Number 22    Number of Visits 22    Date for SLP Re-Evaluation 09/22/20    SLP Start Time 0258    SLP Stop Time  1318    SLP Time Calculation (min) 45 min    Activity Tolerance Patient tolerated treatment well           Past Medical History:  Diagnosis Date  . Hepatitis C   . Hyperlipidemia   . Hypertension   . Osteoporosis   . Stroke ([place])   . Thrombocytosis     Past Surgical History:  Procedure Laterality Date  . IR ANGIO INTRA EXTRACRAN SEL INTERNAL CAROTID BILAT MOD SED  02/24/2020  . IR ANGIO VERTEBRAL SEL VERTEBRAL UNI R MOD SED  02/24/2020  . IR ANGIOGRAM SELECTIVE EACH ADDITIONAL VESSEL  02/24/2020  . IR IVC FILTER PLMT / S&I /IMG GUID/MOD SED  02/22/2020  . IR IVC FILTER RETRIEVAL / S&I /IMG GUID/MOD SED  06/27/2020  . IR RADIOLOGIST EVAL & MGMT  05/23/2020  . IR THROMBECT VENO MECH MOD SED  02/24/2020  . IR US GUIDE VASC ACCESS RIGHT  02/24/2020  . IR US GUIDE VASC ACCESS RIGHT  02/24/2020  . IR VENO SAGITTAL SINUS  02/24/2020  . IR VENO/JUGULAR RIGHT  02/24/2020  . RADIOLOGY WITH ANESTHESIA N/A 02/24/2020   Procedure: IR WITH ANESTHESIA;  Surgeon: Radiologist, Medication, MD;  Location: [facility];  Service: Radiology;  Laterality: N/A;    There were no vitals filed for this visit.   Subjective Assessment - 09/01/20 1235    Subjective "We're waiting for the steak and baked potatoes."    Patient is accompained by: Family member   [name] - husband                ADULT SLP TREATMENT - 09/01/20 1243      General Information   Behavior/Cognition  Alert;Cooperative;Pleasant mood;Requires cueing      Treatment Provided   Treatment provided Cognitive-Linquistic      Cognitive-Linquistic Treatment   Treatment focused on Aphasia;Cognition    Skilled Treatment Pt scored 39/60 on [facility] Test, improved from 29/60 at evaluation, and anecdotally, pt used more semantic cues to attempt to self-cue to generate the target word. SLP reiterated to husband to use phonemic cues to assist pt if he knows the word she is trying to find, but give her 6-7 seconds prior to cueing. [name] demonstrated this during the session today. [name] and [name] were provided information on [employer] aphasia group as well as [employer]. Pt inquired about frequency of VNEST and SLP told her to do 2-3 every other day until Valentine's Day 2022 then to decr to 2-3/week until Memorial Day 2022.       Assessment / Recommendations / Plan   Plan Continue with current plan of care      Progression Toward Goals   Progression toward goals --   pt will cont ST tasks at home           SLP Education - 09/01/20 1332    Education Details aphasia groups, TAP information, phonemic cues  Person(s) Educated Patient;Spouse    Methods Explanation;Handout;Demonstration    Comprehension Verbalized understanding;Returned demonstration            SLP Short Term Goals - 07/27/20 1419      SLP SHORT TERM GOAL #1   Title pt will complete simple-mod complex naming tasks with 80% success x2 sessions    Period --   or 9 total sessions, for all STGs   Status Not Met      SLP SHORT TERM GOAL #2   Title pt will complete any further testing for ST goal writing in the first 4 sessions    Period --   or 5 total sessions   Status Achieved      SLP SHORT TERM GOAL #3   Title pt will bring a memory notebook/binder/planner to 4 therapy sessions    Baseline 07-05-20, 07-06-20, 07-10-20 07/13/20    Status Achieved            SLP Long Term Goals - 09/01/20 1336        SLP LONG TERM GOAL #1   Title pt will demo appropriate use of her binder/planner in 4 sessions    Baseline 07-27-20, 07/31/20, 08-03-20, 08-14-20    Period --   or 22 total sessions, for all LTGs   Status Achieved      SLP LONG TERM GOAL #2   Title pt will use anomic compensations for functional communication in 10 minutes mod complex conversation in 3 sessions    Baseline 09-01-20    Status Partially Met      SLP LONG TERM GOAL #3   Title in order to facilitate pt's expressive language, pt's husband will demonstrate comfort/independence in guiding pt through [place] and/or semantic feature analysis tasks in 2 sessions    Baseline 08-23-20, 09-01-20    Status Achieved            Plan - 09/01/20 1333    Clinical Impression Statement Pt presents today with contiued anomia. Her score on [place] improved from 29 to 39/60. She cont to use her memory notebook with success for tracking events in teh past and in the future. Noted pt's memory for recent past events has improved in teh last 3 weeks. Higher level cognitive deficits in attention persist. See "skilled intervention" for more details. Skilled ST is recommended to d/c at this time - pt/husband both agree with this plan.    Treatment/Interventions Environmental controls;Functional tasks;Compensatory techniques;SLP instruction and feedback;Cueing hierarchy;Language facilitation;Cognitive reorganization;Compensatory strategies;Internal/external aids;Patient/family education    Potential to Achieve Goals Good    Potential Considerations Severity of impairments    Consulted and Agree with Plan of Care Patient           Patient will benefit from skilled therapeutic intervention in order to improve the following deficits and impairments:   Aphasia  Cognitive communication deficit   SPEECH THERAPY DISCHARGE SUMMARY  Visits from Start of Care: 22  Current functional level related to goals / functional outcomes: [name] met  many long term goals, partially met some - see above. Memory is augmented by a memory book. She had improvements in memory, awareness, and attention. Attention deficits may still linger at higher levels. Her verbal expression has seen improvement however she cont to struggle with anomia, occasionally.   Remaining deficits: Anomia/expressive language, high level attention.   Education / Equipment: Compensations for memory, aphasia.   Plan: Patient agrees to discharge.  Patient goals were partially met. Patient is being  discharged due to                                                     ?????continuing to perform ST tasks at home.        Problem List Patient Active Problem List   Diagnosis Date Noted  . Cognitive deficit, post-stroke 07/13/2020  . Cerebral venous sinus thrombosis 03/30/2020  . Presence of IVC filter   . Benign essential HTN   . Peripheral edema   . Impulsive   . Hypoalbuminemia due to protein-calorie malnutrition ([place])   . Anemia   . Prediabetes   . Steroid-induced hyperglycemia   . Embolic stroke ([place]) 95/05/2256  . Dysphagia   . Vascular headache   . Essential thrombocytosis ([place])   . Venous thrombosis   . Polycythemia   . Encounter for central line care   . Nontraumatic subcortical hemorrhage of right cerebral hemisphere ([facility])   . Abnormal CT of the chest   . Atrial flutter with rapid ventricular response ([place])   . Pulmonary embolus ([place]) 02/22/2020  . Polycythemia vera ([place]) 02/17/2020  . Likely Cerebral amyloid angiopathy ([place]) 02/17/2020  . IVH (intraventricular hemorrhage) ([place]) 02/14/2020  . TIA (transient ischemic attack) 08/17/2014  . Acute sinusitis 08/16/2014  . Headache 08/15/2014  . Hypothyroidism 08/15/2014  . Hepatitis C   . Hyperlipidemia   . Hypertension     Jeffery Gammell ,[place], CCC-SLP  09/01/2020, 1:36 PM  [facility] [address] [place], [place],  [address] Phone: [phone]   Fax:  [phone]   Name: ELLINOR TEST MRN: [id number] Date of Birth: 10/29/1946

## 2020-09-01 NOTE — Patient Instructions (Addendum)
  Do 2-3 VNESTs every other day until Valentine's Day, then 2-3 a week until [facility] Day  ===================================   Dr. Leo Rod - contact her for more info about [employer] [facility] 2 Tuesdays/month     [email or web address]   =================================== [email or web address]

## 2020-09-05 NOTE — Progress Notes (Signed)
[facility]  [phone] [phone]) [phone]     ID: [name] DOB: 03/06/1946  MR#: [id number]  GGE#:[id number]  Patient Care Team: Cari Caraway, MD as PCP - General (Family Medicine) Lorretta Harp, MD as PCP - Cardiology (Cardiology) Chauncey Cruel, MD OTHER MD:  CHIEF COMPLAINT: Polycythemia vera/essential thrombocytosis, HIT, coagulopathy, anemia  CURRENT TREATMENT: Hydroxyurea, rivaroxaban   INTERVAL HISTORY: [name] returns today for follow up of her JAK2 positive proliferative neoplasm, which we initially diagnosed as polycythemia vera but which is behaving more as essential thrombocytosis.  She is accompanied by her husband [name].    She was started on hydroxyurea during her recent hospitalization at 500 mg a day.  Initially we had some difficulty with dosing, likely because of intercurrent problems.  She is now on 500 mg a day again and tolerating that well.  Her labs have been very reassuring    She also had extensive thrombosis felt to be related to HIT.  She is on rivaroxaban standard dose for that. She hadd and IV cells are placed.  She underwent venogram on 05/19/2020 showing: significant interval improvement in clot burden; residual nonocclusive thrombus within right common and external iliac veins, as well as a small thrombus at cone of IVC filter.  Accordingly she met with Dr. Anselm Pancoast in interventional radiology, he performed filter retrieval on 06/27/2020.   REVIEW OF SYSTEMS: [name] has had no bleeding problems from the rivaroxaban and tolerated the filter retrieval well.  There have been no signs or symptoms suggestive of either further clotting or bleeding problems.  She is tolerating Hydrea with no side effects that she is aware of and she is obtaining it at a good price.  She has met with neurology and tells me that if everything continues well in June she may be taken off the rivaroxaban.  She continues to have some memory and  speech problems but also continues to improve and today I thought she was quite clear and was able to give me a pretty good account of his her recent history.  A detailed review of systems was otherwise stable   HISTORY OF CURRENT ILLNESS: From the original consult note:  [name] has a history of hypertension followed by Dr. Theadore Nan.  For the last few days she has had headache and she took her blood pressure and it was as high as 168/102 which is unusual for her.  As this was not improving she presented to the emergency room last night 02/13/2020. Dr Roxanne Mins obtained a basic lab work and a head CT.  The head CT without contrast showed an acute intraventricular hemorrhage in the right lateral ventricle, with no parenchymal component.  The lab work showed a white cell count of 12.0, hemoglobin 17.7, and platelets 990,000.  We were consulted for further evaluation and treatment.  The patient's subsequent history is as detailed below.   PAST MEDICAL HISTORY: Past Medical History:  Diagnosis Date  . Hepatitis C   . Hyperlipidemia   . Hypertension   . Osteoporosis   . Stroke ([place])   . Thrombocytosis     PAST SURGICAL HISTORY: Past Surgical History:  Procedure Laterality Date  . IR ANGIO INTRA EXTRACRAN SEL INTERNAL CAROTID BILAT MOD SED  02/24/2020  . IR ANGIO VERTEBRAL SEL VERTEBRAL UNI R MOD SED  02/24/2020  . IR ANGIOGRAM SELECTIVE EACH ADDITIONAL VESSEL  02/24/2020  . IR IVC FILTER PLMT / S&I /IMG GUID/MOD SED  02/22/2020  .  IR IVC FILTER RETRIEVAL / S&I /IMG GUID/MOD SED  06/27/2020  . IR RADIOLOGIST EVAL & MGMT  05/23/2020  . IR THROMBECT VENO MECH MOD SED  02/24/2020  . IR US GUIDE VASC ACCESS RIGHT  02/24/2020  . IR US GUIDE VASC ACCESS RIGHT  02/24/2020  . IR VENO SAGITTAL SINUS  02/24/2020  . IR VENO/JUGULAR RIGHT  02/24/2020  . RADIOLOGY WITH ANESTHESIA N/A 02/24/2020   Procedure: IR WITH ANESTHESIA;  Surgeon: Radiologist, Medication, MD;  Location: [facility];  Service:  Radiology;  Laterality: N/A;    FAMILY HISTORY: Family History  Problem Relation Age of Onset  . Heart disease Mother   . Heart disease Father    The patient's father died at age 63 and the patient's mother at age 64 both from heart disease.  The patient has 1 sister, no brothers.  There is no family history of blood problems and no family history of cancer to the patient's knowledge   GYNECOLOGIC HISTORY:  No LMP recorded. Patient is postmenopausal. Menarche: 74 years old Age at first live birth: 74 years old [place] P 2 LMP 27 HRT yes, a few years Hysterectomy?  Salpingo-oophorectomy?   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  Her husband [name] ran a business but is now retired.  Their son [name] is a Adult nurse.  Their son Aaron Edelman is an Pharmacologist.  The patient has 1 grandson and 4 step grandchildren.  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    ADVANCED DIRECTIVES: In the absence of any documentation to the contrary, the patient's spouse is their HCPOA.    HEALTH MAINTENANCE: Social History   Tobacco Use  . Smoking status: Former Research scientist (life sciences)  . Smokeless tobacco: Never Used  Vaping Use  . Vaping Use: Never used  Substance Use Topics  . Alcohol use: Yes    Alcohol/week: 1.0 standard drink    Types: 1 Glasses of wine per week    Comment: nightly  . Drug use: No     Colonoscopy: [facility]  PAP: [place]  Bone density:    Allergies  Allergen Reactions  . Heparin Other (See Comments)    4/15 HIT OD 2.553, SRA negative > treated as true HIT  . Amoxicillin-Pot Clavulanate Other (See Comments)    Unknown reaction   . Erythromycin Diarrhea, Itching and Other (See Comments)    Severe stomach pains    Current Outpatient Medications  Medication Sig Dispense Refill  . acetaminophen (TYLENOL) 500 MG tablet Take 1,000 mg by mouth every 6 (six) hours as needed for moderate pain or headache.    Marland Kitchen amLODipine (NORVASC) 10 MG tablet Take 10 mg by  mouth daily.  30 tablet 0  . Apoaequorin (PREVAGEN) 10 MG CAPS Take 1 capsule by mouth every morning. 1 capsule 1  . atorvastatin (LIPITOR) 10 MG tablet Take 1 tablet (10 mg total) by mouth daily. (Patient taking differently: Take 10 mg by mouth every evening. ) 30 tablet 0  . docusate sodium (COLACE) 100 MG capsule Take 100 mg by mouth daily.    . EUTHYROX 112 MCG tablet Take 112 mcg by mouth every morning.    . Fluticasone-Umeclidin-Vilant (TRELEGY ELLIPTA) 100-62.5-25 MCG/INH AEPB Inhale 1 puff into the lungs daily.    . hydroxyurea (HYDREA) 500 MG capsule Take 1 capsule (500 mg total) by mouth daily. May take with food to minimize GI side effects. 120 capsule 6  . losartan (COZAAR) 50 MG tablet Take  1 tablet (50 mg total) by mouth 2 (two) times daily. 60 tablet 0  . pantoprazole (PROTONIX) 40 MG tablet Take 1 tablet (40 mg total) by mouth daily. 30 tablet 0  . QUEtiapine (SEROQUEL) 25 MG tablet Take 1 tablet (25 mg total) by mouth at bedtime as needed (for agitation). (Patient not taking: Reported on 08/30/2020) 15 tablet 0  . rivaroxaban (XARELTO) 20 MG TABS tablet Take 1 tablet (20 mg total) by mouth daily with breakfast. 30 tablet 0  . senna-docusate (SENOKOT-S) 8.6-50 MG tablet Take 2 tablets by mouth 2 (two) times daily. 120 tablet 0   No current facility-administered medications for this visit.    OBJECTIVE: White woman who appears stated age 74:   09/06/20 1224  BP: 138/61  Pulse: 69  Resp: 18  Temp: 98.2 F (36.8 C)  SpO2: 100%     Body mass index is 26.08 kg/m.   Wt Readings from Last 3 Encounters:  09/06/20 142 lb 9.6 oz (64.7 kg)  08/30/20 143 lb (64.9 kg)  07/13/20 139 lb 9.6 oz (63.3 kg)      ECOG FS:1 - Symptomatic but completely ambulatory  Sclerae unicteric, EOMs intact Wearing a mask No cervical or supraclavicular adenopathy Lungs no rales or rhonchi Heart regular rate and rhythm Abd soft, nontender, positive bowel sounds MSK no focal spinal  tenderness, no upper extremity lymphedema Neuro: difficulty AB ducting right upper extremity, minimal word finding difficulties, appropriate affect Breast exam: No masses palpated both axillae are benign  LAB RESULTS:  CMP     Component Value Date/Time   NA 142 08/09/2020 1059   K 3.5 08/09/2020 1059   CL 107 08/09/2020 1059   CO2 28 08/09/2020 1059   GLUCOSE 117 (H) 08/09/2020 1059   BUN 13 08/09/2020 1059   CREATININE 1.08 (H) 08/09/2020 1059   CALCIUM 10.0 08/09/2020 1059   PROT 7.2 08/09/2020 1059   ALBUMIN 4.0 08/09/2020 1059   AST 21 08/09/2020 1059   ALT 24 08/09/2020 1059   ALKPHOS 79 08/09/2020 1059   BILITOT 0.8 08/09/2020 1059   GFRNONAA 51 (L) 08/09/2020 1059   GFRAA 59 (L) 08/09/2020 1059    No results found for: TOTALPROTELP, ALBUMINELP, A1GS, A2GS, BETS, BETA2SER, GAMS, MSPIKE, SPEI  Lab Results  Component Value Date   WBC 5.3 09/06/2020   NEUTROABS 3.2 09/06/2020   HGB 12.4 09/06/2020   HCT 37.9 09/06/2020   MCV 108.3 (H) 09/06/2020   PLT 188 09/06/2020    No results found for: LABCA2  No components found for: IHKVQQ595  No results for input(s): INR in the last 168 hours.  No results found for: LABCA2  No results found for: GLO756  No results found for: EPP295  No results found for: JOA416  No results found for: CA2729  No components found for: HGQUANT  No results found for: CEA1 / No results found for: CEA1   No results found for: AFPTUMOR  No results found for: CHROMOGRNA  No results found for: KPAFRELGTCHN, LAMBDASER, KAPLAMBRATIO (kappa/lambda light chains)  No results found for: HGBA, HGBA2QUANT, HGBFQUANT, HGBSQUAN (Hemoglobinopathy evaluation)   Lab Results  Component Value Date   LDH 410 (H) 03/10/2020    Lab Results  Component Value Date   IRON 86 08/09/2020   TIBC 334 08/09/2020   IRONPCTSAT 26 08/09/2020   (Iron and TIBC)  Lab Results  Component Value Date   FERRITIN 59 08/09/2020    Urinalysis      Component Value Date/Time  COLORURINE YELLOW 05/19/2020 [place] 05/19/2020 1554   LABSPEC 1.045 (H) 05/19/2020 1554   PHURINE 6.0 05/19/2020 1554   GLUCOSEU NEGATIVE 05/19/2020 1554   HGBUR NEGATIVE 05/19/2020 1554   BILIRUBINUR NEGATIVE 05/19/2020 1554   KETONESUR NEGATIVE 05/19/2020 1554   PROTEINUR NEGATIVE 05/19/2020 1554   UROBILINOGEN 0.2 08/15/2014 2129   NITRITE POSITIVE (A) 05/19/2020 1554   LEUKOCYTESUR NEGATIVE 05/19/2020 1554     STUDIES: No results found.   ELIGIBLE FOR AVAILABLE RESEARCH PROTOCOL: no  ASSESSMENT: 74 y.o. [place] woman presenting 02/13/2020 with headache, found to have biventricular bleeds in the setting of panmyelosis, subsequently with pulmonary emboli and sinus venous thromboses in the setting of heparin induced thrombocytopenia  (1) myeloproliferative neoplasm: essential thrombocytosis             (a) JAK2 mutation positive              (b) bone marrow biopsy 02/13/2020 shows no evidence of leukemia             (c)  leukapheresis x3 . Last leukapheresis Friday 02/18/2020                         (i) HD catheter removed after pheresis on 02/18/2020             (d) hydrea 500 mg daily to keep HCT <45 and platelets in normal range  (e) Hydrea dose increased to 1000 mg daily Apr 07, 2020--not tolerated  (f) Hydrea resumed at 500 mg daily June 2021              (2) heparin induced thrombocytopenia: positive HIT screen x2             (a) severe thrombocythemia noted 02/24/2020             (b) bilateral pulmonary emboli 02/22/2020                         (i) s/p IVC filter placement 02/22/2020                         (ii) atrial fibrillation with RVR             (c) venous sinus thromboses 02/24/2020                         (i) s/p mechanical thrombectomy             (d) argatroban started 02/25/2020             (e) platelet count responded to heparin withdrawal             (f) confirmatory serotonin release assay negative  (see discussion below)                         (i) continue argatroban for anticoagulation, no heparin                         (ii) repeat H[facility] screen at [facility] lab again strongly positive             (g) platelets normalized as of 03/08/2020  (3) drug-induced thrombocytopenia             (a) discontinued amiodarone, inessential meds                         (  i) remains in sinus             (b) resolved  (4) anemia: reticulocytes inappropriately normal; no folate, B-12 or iron deficiency; normal renal function; LDH slightly elevated but negative DAT and normal t bil (no evidence of hemolysis)--resolved   PLAN: [name] is tolerating the Hydrea well at a dose of 500 mg daily.  She has no leukopenia, no thrombocytosis, and no anemia.  This is optimal.  The plan accordingly is to continue Hydrea 500 mg daily indefinitely.  We are going to check her counts every 3 months for the next year and I will see her again in a year  Incidentally I gave her a copy of short palm by Doree Fudge to memorize.  She will recited at her return visit  Total encounter time 25 minutes.Sarajane Jews C. , MD 09/06/2020 12:39 PM Medical Oncology and Hematology [facility] [place], [place] [address] Tel. [phone]    Fax. [phone]   This document serves as a record of services personally performed by Lurline Del, MD. It was created on his behalf by Wilburn Mylar, a trained medical scribe. The creation of this record is based on the scribe's personal observations and the provider's statements to them.   I, Lurline Del MD, have reviewed the above documentation for accuracy and completeness, and I agree with the above.   *Total Encounter Time as defined by the Centers for Medicare and Medicaid Services includes, in addition to the face-to-face time of a patient visit (documented in the note above) non-face-to-face time: obtaining and reviewing outside  history, ordering and reviewing medications, tests or procedures, care coordination (communications with other health care professionals or caregivers) and documentation in the medical record.

## 2020-09-06 ENCOUNTER — Inpatient Hospital Stay: Payer: Medicare HMO | Attending: Oncology

## 2020-09-06 ENCOUNTER — Other Ambulatory Visit: Payer: Self-pay

## 2020-09-06 ENCOUNTER — Inpatient Hospital Stay: Payer: Medicare HMO | Admitting: Oncology

## 2020-09-06 VITALS — BP 138/61 | HR 69 | Temp 98.2°F | Resp 18 | Ht 62.0 in | Wt 142.6 lb

## 2020-09-06 DIAGNOSIS — Z8673 Personal history of transient ischemic attack (TIA), and cerebral infarction without residual deficits: Secondary | ICD-10-CM | POA: Insufficient documentation

## 2020-09-06 DIAGNOSIS — Z7901 Long term (current) use of anticoagulants: Secondary | ICD-10-CM | POA: Diagnosis not present

## 2020-09-06 DIAGNOSIS — Z79899 Other long term (current) drug therapy: Secondary | ICD-10-CM | POA: Insufficient documentation

## 2020-09-06 DIAGNOSIS — D45 Polycythemia vera: Secondary | ICD-10-CM

## 2020-09-06 DIAGNOSIS — I10 Essential (primary) hypertension: Secondary | ICD-10-CM | POA: Diagnosis not present

## 2020-09-06 DIAGNOSIS — Z87891 Personal history of nicotine dependence: Secondary | ICD-10-CM | POA: Insufficient documentation

## 2020-09-06 DIAGNOSIS — E785 Hyperlipidemia, unspecified: Secondary | ICD-10-CM | POA: Insufficient documentation

## 2020-09-06 DIAGNOSIS — Z86711 Personal history of pulmonary embolism: Secondary | ICD-10-CM | POA: Diagnosis not present

## 2020-09-06 DIAGNOSIS — D473 Essential (hemorrhagic) thrombocythemia: Secondary | ICD-10-CM | POA: Insufficient documentation

## 2020-09-06 DIAGNOSIS — D649 Anemia, unspecified: Secondary | ICD-10-CM | POA: Diagnosis not present

## 2020-09-06 DIAGNOSIS — I4891 Unspecified atrial fibrillation: Secondary | ICD-10-CM | POA: Insufficient documentation

## 2020-09-06 LAB — CBC WITH DIFFERENTIAL/PLATELET
Abs Immature Granulocytes: 0.01 10*3/uL (ref 0.00–0.07)
Basophils Absolute: 0 10*3/uL (ref 0.0–0.1)
Basophils Relative: 0 %
Eosinophils Absolute: 0 10*3/uL (ref 0.0–0.5)
Eosinophils Relative: 0 %
HCT: 37.9 % (ref 36.0–46.0)
Hemoglobin: 12.4 g/dL (ref 12.0–15.0)
Immature Granulocytes: 0 %
Lymphocytes Relative: 31 %
Lymphs Abs: 1.6 10*3/uL (ref 0.7–4.0)
MCH: 35.4 pg — ABNORMAL HIGH (ref 26.0–34.0)
MCHC: 32.7 g/dL (ref 30.0–36.0)
MCV: 108.3 fL — ABNORMAL HIGH (ref 80.0–100.0)
Monocytes Absolute: 0.4 10*3/uL (ref 0.1–1.0)
Monocytes Relative: 8 %
Neutro Abs: 3.2 10*3/uL (ref 1.7–7.7)
Neutrophils Relative %: 61 %
Platelets: 188 10*3/uL (ref 150–400)
RBC: 3.5 MIL/uL — ABNORMAL LOW (ref 3.87–5.11)
RDW: 13.8 % (ref 11.5–15.5)
WBC: 5.3 10*3/uL (ref 4.0–10.5)
nRBC: 0 % (ref 0.0–0.2)

## 2020-09-06 LAB — FERRITIN: Ferritin: 43 ng/mL (ref 11–307)

## 2020-09-06 LAB — COMPREHENSIVE METABOLIC PANEL
ALT: 19 U/L (ref 0–44)
AST: 18 U/L (ref 15–41)
Albumin: 4.2 g/dL (ref 3.5–5.0)
Alkaline Phosphatase: 86 U/L (ref 38–126)
Anion gap: 7 (ref 5–15)
BUN: 12 mg/dL (ref 8–23)
CO2: 26 mmol/L (ref 22–32)
Calcium: 10.2 mg/dL (ref 8.9–10.3)
Chloride: 107 mmol/L (ref 98–111)
Creatinine, Ser: 0.98 mg/dL (ref 0.44–1.00)
GFR, Estimated: >60 mL/min (ref >60)
Glucose, Bld: 112 mg/dL — ABNORMAL HIGH (ref 70–99)
Potassium: 4 mmol/L (ref 3.5–5.1)
Sodium: 140 mmol/L (ref 135–145)
Total Bilirubin: 0.6 mg/dL (ref 0.3–1.2)
Total Protein: 7.1 g/dL (ref 6.5–8.1)

## 2020-09-06 LAB — RETICULOCYTES
Immature Retic Fract: 10.3 % (ref 2.3–15.9)
RBC.: 3.44 MIL/uL — ABNORMAL LOW (ref 3.87–5.11)
Retic Count, Absolute: 42 10*3/uL (ref 19.0–186.0)
Retic Ct Pct: 1.2 % (ref 0.4–3.1)

## 2020-09-06 LAB — IRON AND TIBC
Iron: 79 ug/dL (ref 41–142)
Saturation Ratios: 22 % (ref 21–57)
TIBC: 354 ug/dL (ref 236–444)
UIBC: 275 ug/dL (ref 120–384)

## 2020-09-06 MED ORDER — HYDROXYUREA 500 MG PO CAPS: 500.0000 mg | ORAL_CAPSULE | Freq: Every day | ORAL | 6 refills | Status: DC

## 2020-09-07 ENCOUNTER — Encounter: Payer: Medicare HMO | Admitting: Speech Pathology

## 2020-09-19 DIAGNOSIS — K219 Gastro-esophageal reflux disease without esophagitis: Secondary | ICD-10-CM | POA: Diagnosis not present

## 2020-09-19 DIAGNOSIS — Z79899 Other long term (current) drug therapy: Secondary | ICD-10-CM | POA: Diagnosis not present

## 2020-09-19 DIAGNOSIS — J449 Chronic obstructive pulmonary disease, unspecified: Secondary | ICD-10-CM | POA: Diagnosis not present

## 2020-09-19 DIAGNOSIS — M810 Age-related osteoporosis without current pathological fracture: Secondary | ICD-10-CM | POA: Diagnosis not present

## 2020-09-19 DIAGNOSIS — E538 Deficiency of other specified B group vitamins: Secondary | ICD-10-CM | POA: Diagnosis not present

## 2020-09-19 DIAGNOSIS — E782 Mixed hyperlipidemia: Secondary | ICD-10-CM | POA: Diagnosis not present

## 2020-09-19 DIAGNOSIS — D45 Polycythemia vera: Secondary | ICD-10-CM | POA: Diagnosis not present

## 2020-09-19 DIAGNOSIS — I10 Essential (primary) hypertension: Secondary | ICD-10-CM | POA: Diagnosis not present

## 2020-09-19 DIAGNOSIS — D6869 Other thrombophilia: Secondary | ICD-10-CM | POA: Diagnosis not present

## 2020-09-19 DIAGNOSIS — E039 Hypothyroidism, unspecified: Secondary | ICD-10-CM | POA: Diagnosis not present

## 2020-09-21 DIAGNOSIS — J449 Chronic obstructive pulmonary disease, unspecified: Secondary | ICD-10-CM | POA: Diagnosis not present

## 2020-09-21 DIAGNOSIS — E782 Mixed hyperlipidemia: Secondary | ICD-10-CM | POA: Diagnosis not present

## 2020-09-21 DIAGNOSIS — M810 Age-related osteoporosis without current pathological fracture: Secondary | ICD-10-CM | POA: Diagnosis not present

## 2020-09-21 DIAGNOSIS — D649 Anemia, unspecified: Secondary | ICD-10-CM | POA: Diagnosis not present

## 2020-09-21 DIAGNOSIS — M1812 Unilateral primary osteoarthritis of first carpometacarpal joint, left hand: Secondary | ICD-10-CM | POA: Diagnosis not present

## 2020-09-21 DIAGNOSIS — I610 Nontraumatic intracerebral hemorrhage in hemisphere, subcortical: Secondary | ICD-10-CM | POA: Diagnosis not present

## 2020-09-21 DIAGNOSIS — K219 Gastro-esophageal reflux disease without esophagitis: Secondary | ICD-10-CM | POA: Diagnosis not present

## 2020-09-21 DIAGNOSIS — E039 Hypothyroidism, unspecified: Secondary | ICD-10-CM | POA: Diagnosis not present

## 2020-09-21 DIAGNOSIS — I639 Cerebral infarction, unspecified: Secondary | ICD-10-CM | POA: Diagnosis not present

## 2020-09-21 DIAGNOSIS — I10 Essential (primary) hypertension: Secondary | ICD-10-CM | POA: Diagnosis not present

## 2020-11-29 ENCOUNTER — Other Ambulatory Visit: Payer: Self-pay

## 2020-11-29 ENCOUNTER — Inpatient Hospital Stay: Payer: Medicare HMO | Attending: Oncology

## 2020-11-29 DIAGNOSIS — D45 Polycythemia vera: Secondary | ICD-10-CM | POA: Diagnosis not present

## 2020-11-29 LAB — CBC WITH DIFFERENTIAL/PLATELET
Abs Immature Granulocytes: 0.02 10*3/uL (ref 0.00–0.07)
Basophils Absolute: 0 10*3/uL (ref 0.0–0.1)
Basophils Relative: 0 %
Eosinophils Absolute: 0 10*3/uL (ref 0.0–0.5)
Eosinophils Relative: 0 %
HCT: 35.9 % — ABNORMAL LOW (ref 36.0–46.0)
Hemoglobin: 11.9 g/dL — ABNORMAL LOW (ref 12.0–15.0)
Immature Granulocytes: 0 %
Lymphocytes Relative: 22 %
Lymphs Abs: 1.3 10*3/uL (ref 0.7–4.0)
MCH: 37.9 pg — ABNORMAL HIGH (ref 26.0–34.0)
MCHC: 33.1 g/dL (ref 30.0–36.0)
MCV: 114.3 fL — ABNORMAL HIGH (ref 80.0–100.0)
Monocytes Absolute: 0.5 10*3/uL (ref 0.1–1.0)
Monocytes Relative: 8 %
Neutro Abs: 4.2 10*3/uL (ref 1.7–7.7)
Neutrophils Relative %: 70 %
Platelets: 190 10*3/uL (ref 150–400)
RBC: 3.14 MIL/uL — ABNORMAL LOW (ref 3.87–5.11)
RDW: 13.9 % (ref 11.5–15.5)
WBC: 6 10*3/uL (ref 4.0–10.5)
nRBC: 0 % (ref 0.0–0.2)

## 2020-11-29 LAB — COMPREHENSIVE METABOLIC PANEL
ALT: 19 U/L (ref 0–44)
AST: 17 U/L (ref 15–41)
Albumin: 4.1 g/dL (ref 3.5–5.0)
Alkaline Phosphatase: 86 U/L (ref 38–126)
Anion gap: 10 (ref 5–15)
BUN: 13 mg/dL (ref 8–23)
CO2: 24 mmol/L (ref 22–32)
Calcium: 9.9 mg/dL (ref 8.9–10.3)
Chloride: 109 mmol/L (ref 98–111)
Creatinine, Ser: 0.94 mg/dL (ref 0.44–1.00)
GFR, Estimated: >60 mL/min (ref >60)
Glucose, Bld: 110 mg/dL — ABNORMAL HIGH (ref 70–99)
Potassium: 4.2 mmol/L (ref 3.5–5.1)
Sodium: 143 mmol/L (ref 135–145)
Total Bilirubin: 1 mg/dL (ref 0.3–1.2)
Total Protein: 7.2 g/dL (ref 6.5–8.1)

## 2020-11-29 LAB — RETICULOCYTES
Immature Retic Fract: 11.7 % (ref 2.3–15.9)
RBC.: 3.17 MIL/uL — ABNORMAL LOW (ref 3.87–5.11)
Retic Count, Absolute: 52.9 10*3/uL (ref 19.0–186.0)
Retic Ct Pct: 1.7 % (ref 0.4–3.1)

## 2020-11-29 LAB — IRON AND TIBC
Iron: 105 ug/dL (ref 41–142)
Saturation Ratios: 30 % (ref 21–57)
TIBC: 346 ug/dL (ref 236–444)
UIBC: 241 ug/dL (ref 120–384)

## 2020-11-29 LAB — FERRITIN: Ferritin: 55 ng/mL (ref 11–307)

## 2020-12-04 DIAGNOSIS — Z6827 Body mass index (BMI) 27.0-27.9, adult: Secondary | ICD-10-CM | POA: Diagnosis not present

## 2020-12-04 DIAGNOSIS — Z1231 Encounter for screening mammogram for malignant neoplasm of breast: Secondary | ICD-10-CM | POA: Diagnosis not present

## 2020-12-04 DIAGNOSIS — Z01419 Encounter for gynecological examination (general) (routine) without abnormal findings: Secondary | ICD-10-CM | POA: Diagnosis not present

## 2020-12-05 DIAGNOSIS — E782 Mixed hyperlipidemia: Secondary | ICD-10-CM | POA: Diagnosis not present

## 2020-12-05 DIAGNOSIS — J449 Chronic obstructive pulmonary disease, unspecified: Secondary | ICD-10-CM | POA: Diagnosis not present

## 2020-12-05 DIAGNOSIS — M810 Age-related osteoporosis without current pathological fracture: Secondary | ICD-10-CM | POA: Diagnosis not present

## 2020-12-05 DIAGNOSIS — D649 Anemia, unspecified: Secondary | ICD-10-CM | POA: Diagnosis not present

## 2020-12-05 DIAGNOSIS — I10 Essential (primary) hypertension: Secondary | ICD-10-CM | POA: Diagnosis not present

## 2020-12-05 DIAGNOSIS — K219 Gastro-esophageal reflux disease without esophagitis: Secondary | ICD-10-CM | POA: Diagnosis not present

## 2020-12-05 DIAGNOSIS — I610 Nontraumatic intracerebral hemorrhage in hemisphere, subcortical: Secondary | ICD-10-CM | POA: Diagnosis not present

## 2020-12-05 DIAGNOSIS — M1812 Unilateral primary osteoarthritis of first carpometacarpal joint, left hand: Secondary | ICD-10-CM | POA: Diagnosis not present

## 2020-12-05 DIAGNOSIS — E039 Hypothyroidism, unspecified: Secondary | ICD-10-CM | POA: Diagnosis not present

## 2020-12-05 DIAGNOSIS — I639 Cerebral infarction, unspecified: Secondary | ICD-10-CM | POA: Diagnosis not present

## 2020-12-15 ENCOUNTER — Emergency Department (HOSPITAL_BASED_OUTPATIENT_CLINIC_OR_DEPARTMENT_OTHER): Payer: Medicare HMO

## 2020-12-15 ENCOUNTER — Encounter (HOSPITAL_BASED_OUTPATIENT_CLINIC_OR_DEPARTMENT_OTHER): Payer: Self-pay | Admitting: Emergency Medicine

## 2020-12-15 ENCOUNTER — Other Ambulatory Visit: Payer: Self-pay

## 2020-12-15 ENCOUNTER — Emergency Department (HOSPITAL_BASED_OUTPATIENT_CLINIC_OR_DEPARTMENT_OTHER)
Admission: EM | Admit: 2020-12-15 | Discharge: 2020-12-16 | Disposition: A | Discharge status: Home / Self Care | Payer: Medicare HMO | Attending: Emergency Medicine | Admitting: Emergency Medicine

## 2020-12-15 DIAGNOSIS — I700 Atherosclerosis of aorta: Secondary | ICD-10-CM | POA: Diagnosis not present

## 2020-12-15 DIAGNOSIS — Y93G3 Activity, cooking and baking: Secondary | ICD-10-CM | POA: Insufficient documentation

## 2020-12-15 DIAGNOSIS — R519 Headache, unspecified: Secondary | ICD-10-CM | POA: Insufficient documentation

## 2020-12-15 DIAGNOSIS — R0902 Hypoxemia: Secondary | ICD-10-CM | POA: Diagnosis not present

## 2020-12-15 DIAGNOSIS — Z7901 Long term (current) use of anticoagulants: Secondary | ICD-10-CM | POA: Diagnosis not present

## 2020-12-15 DIAGNOSIS — W010XXA Fall on same level from slipping, tripping and stumbling without subsequent striking against object, initial encounter: Secondary | ICD-10-CM | POA: Insufficient documentation

## 2020-12-15 DIAGNOSIS — Y92000 Kitchen of unspecified non-institutional (private) residence as  the place of occurrence of the external cause: Secondary | ICD-10-CM | POA: Insufficient documentation

## 2020-12-15 DIAGNOSIS — S299XXA Unspecified injury of thorax, initial encounter: Secondary | ICD-10-CM | POA: Diagnosis not present

## 2020-12-15 DIAGNOSIS — W19XXXA Unspecified fall, initial encounter: Secondary | ICD-10-CM | POA: Diagnosis not present

## 2020-12-15 DIAGNOSIS — Z87891 Personal history of nicotine dependence: Secondary | ICD-10-CM | POA: Insufficient documentation

## 2020-12-15 DIAGNOSIS — I672 Cerebral atherosclerosis: Secondary | ICD-10-CM | POA: Diagnosis not present

## 2020-12-15 DIAGNOSIS — S22079A Unspecified fracture of T9-T10 vertebra, initial encounter for closed fracture: Secondary | ICD-10-CM | POA: Diagnosis not present

## 2020-12-15 DIAGNOSIS — J3489 Other specified disorders of nose and nasal sinuses: Secondary | ICD-10-CM | POA: Diagnosis not present

## 2020-12-15 DIAGNOSIS — Z79899 Other long term (current) drug therapy: Secondary | ICD-10-CM | POA: Diagnosis not present

## 2020-12-15 DIAGNOSIS — R0781 Pleurodynia: Secondary | ICD-10-CM | POA: Diagnosis not present

## 2020-12-15 DIAGNOSIS — S22070A Wedge compression fracture of T9-T10 vertebra, initial encounter for closed fracture: Secondary | ICD-10-CM | POA: Diagnosis not present

## 2020-12-15 DIAGNOSIS — I10 Essential (primary) hypertension: Secondary | ICD-10-CM | POA: Diagnosis not present

## 2020-12-15 DIAGNOSIS — S20212A Contusion of left front wall of thorax, initial encounter: Secondary | ICD-10-CM | POA: Insufficient documentation

## 2020-12-15 DIAGNOSIS — J432 Centrilobular emphysema: Secondary | ICD-10-CM | POA: Diagnosis not present

## 2020-12-15 DIAGNOSIS — R52 Pain, unspecified: Secondary | ICD-10-CM | POA: Diagnosis not present

## 2020-12-15 DIAGNOSIS — S0031XA Abrasion of nose, initial encounter: Secondary | ICD-10-CM | POA: Diagnosis not present

## 2020-12-15 DIAGNOSIS — S0990XA Unspecified injury of head, initial encounter: Secondary | ICD-10-CM | POA: Diagnosis not present

## 2020-12-15 DIAGNOSIS — Z743 Need for continuous supervision: Secondary | ICD-10-CM | POA: Diagnosis not present

## 2020-12-15 DIAGNOSIS — J320 Chronic maxillary sinusitis: Secondary | ICD-10-CM | POA: Diagnosis not present

## 2020-12-15 MED ORDER — FENTANYL CITRATE (PF) 100 MCG/2ML IJ SOLN
50.0000 ug | Freq: Once | INTRAMUSCULAR | Status: AC
  Administered 2020-12-15: 50 ug via INTRAVENOUS
  Filled 2020-12-15: qty 2

## 2020-12-15 MED ORDER — ONDANSETRON 4 MG PO TBDP: 4.0000 mg | ORAL_TABLET | Freq: Three times a day (TID) | ORAL | 0 refills | Status: DC | PRN

## 2020-12-15 MED ORDER — KETOROLAC TROMETHAMINE 30 MG/ML IJ SOLN
15.0000 mg | Freq: Once | INTRAMUSCULAR | Status: AC
  Administered 2020-12-15: 15 mg via INTRAVENOUS
  Filled 2020-12-15: qty 1

## 2020-12-15 MED ORDER — ONDANSETRON 4 MG PO TBDP
4.0000 mg | ORAL_TABLET | Freq: Once | ORAL | Status: AC
  Administered 2020-12-15: 4 mg via ORAL
  Filled 2020-12-15: qty 1

## 2020-12-15 MED ORDER — HYDROCODONE-ACETAMINOPHEN 5-325 MG PO TABS
1.0000 | ORAL_TABLET | Freq: Four times a day (QID) | ORAL | 0 refills | Status: DC | PRN
Start: 2020-12-15 — End: 2022-08-05

## 2020-12-15 NOTE — ED Triage Notes (Signed)
Patient arrived via EMS c/o fall with pain across lower ribcage/upper abdomen. Patient does not remember what she landed on, endorses increased pain with deep breath. Patient additionally states unable to take deep breath. Patient is AO x 4, Pain 10/10, SPO2 94% on 3L O2.

## 2020-12-15 NOTE — ED Provider Notes (Signed)
[facility] EMERGENCY DEPARTMENT Provider Note  CSN: [id number] Arrival date & time: 12/15/20 2038    History Chief Complaint  Patient presents with  . Fall    HPI  [name] is a 75 y.o. female brought to the ED via EMS from home after she slipped and fell landing on her left side. She is complaining of severe pain in L ribs worse with movement and deep breath. She also hit her nose and sustained an abrasion there. No known LOC. Given Fentanyl enroute with minimal improvement. Per husband patient had a stroke last year complicated by HIT and is now on Xarelto.    Past Medical History:  Diagnosis Date  . Hepatitis C   . Hyperlipidemia   . Hypertension   . Osteoporosis   . Stroke ([place])   . Thrombocytosis     Past Surgical History:  Procedure Laterality Date  . IR ANGIO INTRA EXTRACRAN SEL INTERNAL CAROTID BILAT MOD SED  02/24/2020  . IR ANGIO VERTEBRAL SEL VERTEBRAL UNI R MOD SED  02/24/2020  . IR ANGIOGRAM SELECTIVE EACH ADDITIONAL VESSEL  02/24/2020  . IR IVC FILTER PLMT / S&I /IMG GUID/MOD SED  02/22/2020  . IR IVC FILTER RETRIEVAL / S&I /IMG GUID/MOD SED  06/27/2020  . IR RADIOLOGIST EVAL & MGMT  05/23/2020  . IR THROMBECT VENO MECH MOD SED  02/24/2020  . IR US GUIDE VASC ACCESS RIGHT  02/24/2020  . IR US GUIDE VASC ACCESS RIGHT  02/24/2020  . IR VENO SAGITTAL SINUS  02/24/2020  . IR VENO/JUGULAR RIGHT  02/24/2020  . RADIOLOGY WITH ANESTHESIA N/A 02/24/2020   Procedure: IR WITH ANESTHESIA;  Surgeon: Radiologist, Medication, MD;  Location: [facility];  Service: Radiology;  Laterality: N/A;    Family History  Problem Relation Age of Onset  . Heart disease Mother   . Heart disease Father     Social History   Tobacco Use  . Smoking status: Former Research scientist (life sciences)  . Smokeless tobacco: Never Used  Vaping Use  . Vaping Use: Never used  Substance Use Topics  . Alcohol use: Yes    Alcohol/week: 1.0 standard drink    Types: 1 Glasses of wine per week    Comment: nightly   . Drug use: No     Home Medications Prior to Admission medications   Medication Sig Start Date End Date Taking? Authorizing Provider  HYDROcodone-acetaminophen (NORCO/VICODIN) 5-325 MG tablet Take 1 tablet by mouth every 6 (six) hours as needed for severe pain. 12/15/20  Yes Truddie Hidden, MD  ondansetron (ZOFRAN ODT) 4 MG disintegrating tablet Take 1 tablet (4 mg total) by mouth every 8 (eight) hours as needed for nausea or vomiting. 12/15/20  Yes Truddie Hidden, MD  acetaminophen (TYLENOL) 500 MG tablet Take 1,000 mg by mouth every 6 (six) hours as needed for moderate pain or headache.    [provider]  amLODipine (NORVASC) 10 MG tablet Take 10 mg by mouth daily.  05/22/20   Magrinat, Virgie Dad, MD  Apoaequorin (PREVAGEN) 10 MG CAPS Take 1 capsule by mouth every morning. 08/30/20   Garvin Fila, MD  atorvastatin (LIPITOR) 10 MG tablet Take 1 tablet (10 mg total) by mouth daily. Patient taking differently: Take 10 mg by mouth every evening.  03/29/20   Love, Ivan Anchors, PA-C  docusate sodium (COLACE) 100 MG capsule Take 100 mg by mouth daily.    [provider]  EUTHYROX 112 MCG tablet Take 112 mcg  by mouth every morning. 07/09/20   [provider]  Fluticasone-Umeclidin-Vilant (TRELEGY ELLIPTA) 100-62.5-25 MCG/INH AEPB Inhale 1 puff into the lungs daily.    [provider]  hydroxyurea (HYDREA) 500 MG capsule Take 1 capsule (500 mg total) by mouth daily. May take with food to minimize GI side effects. 09/06/20   Magrinat, Virgie Dad, MD  losartan (COZAAR) 50 MG tablet Take 1 tablet (50 mg total) by mouth 2 (two) times daily. 03/29/20   Love, Ivan Anchors, PA-C  pantoprazole (PROTONIX) 40 MG tablet Take 1 tablet (40 mg total) by mouth daily. 03/29/20   Love, Ivan Anchors, PA-C  QUEtiapine (SEROQUEL) 25 MG tablet Take 1 tablet (25 mg total) by mouth at bedtime as needed (for agitation). Patient not taking: Reported on 08/30/2020 03/29/20   Love, Ivan Anchors, PA-C   rivaroxaban (XARELTO) 20 MG TABS tablet Take 1 tablet (20 mg total) by mouth daily with breakfast. 03/29/20   Love, Ivan Anchors, PA-C  senna-docusate (SENOKOT-S) 8.6-50 MG tablet Take 2 tablets by mouth 2 (two) times daily. 03/29/20   Love, Ivan Anchors, PA-C     Allergies    Heparin, Amoxicillin-pot clavulanate, and Erythromycin   Review of Systems   Review of Systems A comprehensive review of systems was completed and negative except as noted in HPI.    Physical Exam BP 113/60   Pulse 77   Temp 97.9 F (36.6 C) (Oral)   Resp 13   Ht 5\' 2"  (1.575 m)   Wt 63.5 kg   SpO2 95%   BMI 25.61 kg/m   Physical Exam Vitals and nursing note reviewed.  Constitutional:      Appearance: Normal appearance.  HENT:     Head: Normocephalic and atraumatic.     Nose: Nose normal.     Mouth/Throat:     Mouth: Mucous membranes are moist.  Eyes:     Extraocular Movements: Extraocular movements intact.     Conjunctiva/sclera: Conjunctivae normal.  Cardiovascular:     Rate and Rhythm: Normal rate.  Pulmonary:     Effort: Pulmonary effort is normal.     Breath sounds: Normal breath sounds.  Chest:     Chest wall: Tenderness (left lateral chest wall) present.  Abdominal:     General: Abdomen is flat.     Palpations: Abdomen is soft.     Tenderness: There is no abdominal tenderness.  Musculoskeletal:        General: No swelling. Normal range of motion.     Cervical back: Neck supple.  Skin:    General: Skin is warm and dry.  Neurological:     General: No focal deficit present.     Mental Status: She is alert.  Psychiatric:        Mood and Affect: Mood normal.      ED Results / Procedures / Treatments   Labs (all labs ordered are listed, but only abnormal results are displayed) Labs Reviewed - No data to display  EKG None   Radiology DG Ribs Unilateral W/Chest Right  Result Date: 12/15/2020 CLINICAL DATA:  75 year old female with fall and trauma to the right chest wall. EXAM:  RIGHT RIBS AND CHEST - 3+ VIEW COMPARISON:  Chest radiograph dated 02/22/2020. FINDINGS: There is shallow inspiration. No focal consolidation, pleural effusion or pneumothorax. Diffuse chronic interstitial coarsening. The cardiac silhouette is within limits. Atherosclerotic calcification of the aorta. Osteopenia with degenerative changes of the spine. No acute osseous pathology. No displaced rib fractures. IMPRESSION: No  acute cardiopulmonary process.  No displaced rib fractures. Electronically Signed   By: Anner Crete M.D.   On: 12/15/2020 21:56   CT Head Wo Contrast  Result Date: 12/15/2020 CLINICAL DATA:  Anticoagulated.  Trauma to the head. EXAM: CT HEAD WITHOUT CONTRAST TECHNIQUE: Contiguous axial images were obtained from the base of the skull through the vertex without intravenous contrast. COMPARISON:  03/06/2020 FINDINGS: Brain: No acute CT finding. Advanced chronic small vessel disease throughout the hemispheric white matter. Old right occipital cortical and subcortical infarction. No mass, hemorrhage, hydrocephalus or extra-axial collection. No visible change since previous study. Vascular: There is atherosclerotic calcification of the major vessels at the base of the brain. Skull: Negative Sinuses/Orbits: Small amount of mucoid material in the right maxillary sinus. Other sinuses are clear. Orbits negative. Other: None IMPRESSION: No acute or traumatic finding. Advanced chronic small vessel disease of the hemispheric white matter. Old right occipital cortical and subcortical infarction. Electronically Signed   By: Nelson Chimes M.D.   On: 12/15/2020 22:17   CT Chest Wo Contrast  Result Date: 12/15/2020 CLINICAL DATA:  75 year old female with trauma. Concern for rib fracture. EXAM: CT CHEST WITHOUT CONTRAST TECHNIQUE: Multidetector CT imaging of the chest was performed following the standard protocol without IV contrast. COMPARISON:  Chest CT dated 03/03/2020. rib radiograph dated 12/15/2020.  FINDINGS: Evaluation of this exam is limited in the absence of intravenous contrast. Cardiovascular: Top-normal cardiac size. No pericardial effusion. There is advanced coronary vascular calcification. Moderate atherosclerotic calcification of the thoracic aorta. No aneurysmal dilatation. The central pulmonary arteries are unremarkable. Mediastinum/Nodes: No hilar or mediastinal adenopathy. There is a small hiatal hernia. The esophagus is grossly unremarkable. No mediastinal fluid collection. Lungs/Pleura: Bilateral lower lobe and bibasilar streaky densities may represent atelectasis/scarring. Atypical infiltrate is less likely but not excluded clinical correlation is recommended. There is moderate centrilobular emphysema. No pleural effusion pneumothorax. The central airways are patent. Upper Abdomen: Indeterminate 2.2 cm left renal upper pole hypodense lesion corresponding to the previously seen cyst. Cholecystectomy. Musculoskeletal: Advanced osteopenia. Age indeterminate compression fracture of T6 with approximately 40% loss of vertebral body height, new since the prior CT and likely acute or subacute. Correlation with point tenderness recommended. There is approximately 3 mm buckling of the posterior inferior cortex of T6. There is also compression fracture of T10 with approximately 40% loss of vertebral body height, chronic. Apparent irregularity of the left first lymph, likely chronic. No acute rib fracture identified. IMPRESSION: 1. Probable acute or subacute compression fracture of T6 with approximately 40% loss of vertebral body height. Correlation with point tenderness recommended. 2. Chronic compression fracture of T10 with approximately 40% loss of vertebral body height. 3. Bilateral lower lobe and bibasilar streaky densities may represent atelectasis/scarring. Atypical infiltrate is less likely but not excluded clinical correlation is recommended. 4. Aortic Atherosclerosis (ICD10-I70.0) and Emphysema  (ICD10-J43.9). Electronically Signed   By: Anner Crete M.D.   On: 12/15/2020 22:24    Procedures Procedures  Medications Ordered in the ED Medications  ondansetron (ZOFRAN-ODT) disintegrating tablet 4 mg (has no administration in time range)  fentaNYL (SUBLIMAZE) injection 50 mcg (50 mcg Intravenous Given 12/15/20 2220)  ketorolac (TORADOL) 30 MG/ML injection 15 mg (15 mg Intravenous Given 12/15/20 2239)     MDM Rules/Calculators/A&P MDM  ED Course  I have reviewed the triage vital signs and the nursing notes.  Pertinent labs & imaging results that were available during my care of the patient were reviewed by me and considered in my  medical decision making (see chart for details).  Clinical Course as of 12/15/20 2328  Ludwig Clarks Dec 15, 2020  2149 Patient inadvertently had R rib xray done when her pain is on the left. Will send for CT head due to head injury on Xarelto and will check a CT chest without at the same time. Additional pain medications given.  [CS]  2316 Imaging reviewed, she is not tender to palpation of the T6 area, husband thinks she has had a prior injury there. She continues to have some pain but improved. Taken off oxygen provided by EMS and will attempt to stand/ambulate as she would like to go home.  [CS]  2323 Patient and husband think she will be able to go home. Will place discharge orders and Rx for pain meds. Dr. Roxanne Mins is aware of her presentation if she is unable to get up and home safely.  [CS]    Clinical Course User Index [CS] Truddie Hidden, MD    Final Clinical Impression(s) / ED Diagnoses Final diagnoses:  Fall, initial encounter  Abrasion of nose, initial encounter  Chest wall contusion, left, initial encounter    Rx / DC Orders ED Discharge Orders         Ordered    HYDROcodone-acetaminophen (NORCO/VICODIN) 5-325 MG tablet  Every 6 hours PRN        12/15/20 2326    ondansetron (ZOFRAN ODT) 4 MG disintegrating tablet  Every 8 hours PRN         12/15/20 2326           Truddie Hidden, MD 12/15/20 2328

## 2020-12-15 NOTE — ED Triage Notes (Signed)
Pt brought in by EMS from home  Pt was in the kitchen cooking and slipped on the floor  Pt has a small abrasion to the bridge of her nose, tenderness to her left lower rib/flank area  No bruising noted  Hx of stroke  Pt is on blood pressure medication  B/P 96/60 initially   Pt was given 2 doses of fentanyl 50 mcg each dose  Pt was placed on oxygen after the second dose   Denies LOC

## 2020-12-15 NOTE — ED Notes (Signed)
Patient sitting on side of bed with husband at bedside.  Will attempt to ambulate asap.

## 2020-12-16 NOTE — ED Provider Notes (Signed)
Care assumed from Dr. Karle Starch, patient presented with fall and negative x-rays signed out to me with intent to discharge if she was able to ambulate.  Patient was able to ambulate without difficulty and is discharged.   Delora Fuel, MD 22/48/25 [phone]

## 2020-12-18 DIAGNOSIS — R0789 Other chest pain: Secondary | ICD-10-CM | POA: Diagnosis not present

## 2020-12-18 DIAGNOSIS — D6869 Other thrombophilia: Secondary | ICD-10-CM | POA: Diagnosis not present

## 2020-12-18 DIAGNOSIS — E039 Hypothyroidism, unspecified: Secondary | ICD-10-CM | POA: Diagnosis not present

## 2020-12-18 DIAGNOSIS — D45 Polycythemia vera: Secondary | ICD-10-CM | POA: Diagnosis not present

## 2020-12-18 DIAGNOSIS — Z79899 Other long term (current) drug therapy: Secondary | ICD-10-CM | POA: Diagnosis not present

## 2020-12-18 DIAGNOSIS — M8000XD Age-related osteoporosis with current pathological fracture, unspecified site, subsequent encounter for fracture with routine healing: Secondary | ICD-10-CM | POA: Diagnosis not present

## 2021-01-01 DIAGNOSIS — I610 Nontraumatic intracerebral hemorrhage in hemisphere, subcortical: Secondary | ICD-10-CM | POA: Diagnosis not present

## 2021-01-01 DIAGNOSIS — K219 Gastro-esophageal reflux disease without esophagitis: Secondary | ICD-10-CM | POA: Diagnosis not present

## 2021-01-01 DIAGNOSIS — M810 Age-related osteoporosis without current pathological fracture: Secondary | ICD-10-CM | POA: Diagnosis not present

## 2021-01-01 DIAGNOSIS — I10 Essential (primary) hypertension: Secondary | ICD-10-CM | POA: Diagnosis not present

## 2021-01-01 DIAGNOSIS — E782 Mixed hyperlipidemia: Secondary | ICD-10-CM | POA: Diagnosis not present

## 2021-01-01 DIAGNOSIS — I639 Cerebral infarction, unspecified: Secondary | ICD-10-CM | POA: Diagnosis not present

## 2021-01-01 DIAGNOSIS — M1812 Unilateral primary osteoarthritis of first carpometacarpal joint, left hand: Secondary | ICD-10-CM | POA: Diagnosis not present

## 2021-01-01 DIAGNOSIS — D649 Anemia, unspecified: Secondary | ICD-10-CM | POA: Diagnosis not present

## 2021-01-01 DIAGNOSIS — E039 Hypothyroidism, unspecified: Secondary | ICD-10-CM | POA: Diagnosis not present

## 2021-01-01 DIAGNOSIS — J449 Chronic obstructive pulmonary disease, unspecified: Secondary | ICD-10-CM | POA: Diagnosis not present

## 2021-01-09 DIAGNOSIS — M8588 Other specified disorders of bone density and structure, other site: Secondary | ICD-10-CM | POA: Diagnosis not present

## 2021-01-09 DIAGNOSIS — N958 Other specified menopausal and perimenopausal disorders: Secondary | ICD-10-CM | POA: Diagnosis not present

## 2021-01-10 DIAGNOSIS — Z79899 Other long term (current) drug therapy: Secondary | ICD-10-CM | POA: Diagnosis not present

## 2021-01-10 DIAGNOSIS — I700 Atherosclerosis of aorta: Secondary | ICD-10-CM | POA: Diagnosis not present

## 2021-01-10 DIAGNOSIS — E538 Deficiency of other specified B group vitamins: Secondary | ICD-10-CM | POA: Diagnosis not present

## 2021-01-10 DIAGNOSIS — J449 Chronic obstructive pulmonary disease, unspecified: Secondary | ICD-10-CM | POA: Diagnosis not present

## 2021-01-10 DIAGNOSIS — M8000XD Age-related osteoporosis with current pathological fracture, unspecified site, subsequent encounter for fracture with routine healing: Secondary | ICD-10-CM | POA: Diagnosis not present

## 2021-01-10 DIAGNOSIS — E039 Hypothyroidism, unspecified: Secondary | ICD-10-CM | POA: Diagnosis not present

## 2021-01-10 DIAGNOSIS — S22000A Wedge compression fracture of unspecified thoracic vertebra, initial encounter for closed fracture: Secondary | ICD-10-CM | POA: Diagnosis not present

## 2021-01-12 DIAGNOSIS — S22050A Wedge compression fracture of T5-T6 vertebra, initial encounter for closed fracture: Secondary | ICD-10-CM | POA: Diagnosis not present

## 2021-01-18 DIAGNOSIS — E039 Hypothyroidism, unspecified: Secondary | ICD-10-CM | POA: Diagnosis not present

## 2021-01-18 DIAGNOSIS — I10 Essential (primary) hypertension: Secondary | ICD-10-CM | POA: Diagnosis not present

## 2021-01-18 DIAGNOSIS — K219 Gastro-esophageal reflux disease without esophagitis: Secondary | ICD-10-CM | POA: Diagnosis not present

## 2021-01-18 DIAGNOSIS — D473 Essential (hemorrhagic) thrombocythemia: Secondary | ICD-10-CM | POA: Diagnosis not present

## 2021-01-18 DIAGNOSIS — D6869 Other thrombophilia: Secondary | ICD-10-CM | POA: Diagnosis not present

## 2021-01-18 DIAGNOSIS — E782 Mixed hyperlipidemia: Secondary | ICD-10-CM | POA: Diagnosis not present

## 2021-01-18 DIAGNOSIS — R7303 Prediabetes: Secondary | ICD-10-CM | POA: Diagnosis not present

## 2021-01-18 DIAGNOSIS — J449 Chronic obstructive pulmonary disease, unspecified: Secondary | ICD-10-CM | POA: Diagnosis not present

## 2021-01-18 DIAGNOSIS — M8000XA Age-related osteoporosis with current pathological fracture, unspecified site, initial encounter for fracture: Secondary | ICD-10-CM | POA: Diagnosis not present

## 2021-01-24 DIAGNOSIS — S22050A Wedge compression fracture of T5-T6 vertebra, initial encounter for closed fracture: Secondary | ICD-10-CM | POA: Diagnosis not present

## 2021-02-06 DIAGNOSIS — I639 Cerebral infarction, unspecified: Secondary | ICD-10-CM | POA: Diagnosis not present

## 2021-02-06 DIAGNOSIS — K219 Gastro-esophageal reflux disease without esophagitis: Secondary | ICD-10-CM | POA: Diagnosis not present

## 2021-02-06 DIAGNOSIS — I610 Nontraumatic intracerebral hemorrhage in hemisphere, subcortical: Secondary | ICD-10-CM | POA: Diagnosis not present

## 2021-02-06 DIAGNOSIS — D649 Anemia, unspecified: Secondary | ICD-10-CM | POA: Diagnosis not present

## 2021-02-06 DIAGNOSIS — E039 Hypothyroidism, unspecified: Secondary | ICD-10-CM | POA: Diagnosis not present

## 2021-02-06 DIAGNOSIS — I10 Essential (primary) hypertension: Secondary | ICD-10-CM | POA: Diagnosis not present

## 2021-02-06 DIAGNOSIS — M810 Age-related osteoporosis without current pathological fracture: Secondary | ICD-10-CM | POA: Diagnosis not present

## 2021-02-06 DIAGNOSIS — E782 Mixed hyperlipidemia: Secondary | ICD-10-CM | POA: Diagnosis not present

## 2021-02-06 DIAGNOSIS — M1812 Unilateral primary osteoarthritis of first carpometacarpal joint, left hand: Secondary | ICD-10-CM | POA: Diagnosis not present

## 2021-02-06 DIAGNOSIS — J449 Chronic obstructive pulmonary disease, unspecified: Secondary | ICD-10-CM | POA: Diagnosis not present

## 2021-02-09 DIAGNOSIS — Z6826 Body mass index (BMI) 26.0-26.9, adult: Secondary | ICD-10-CM | POA: Diagnosis not present

## 2021-02-09 DIAGNOSIS — R030 Elevated blood-pressure reading, without diagnosis of hypertension: Secondary | ICD-10-CM | POA: Diagnosis not present

## 2021-02-09 DIAGNOSIS — S22050K Wedge compression fracture of T5-T6 vertebra, subsequent encounter for fracture with nonunion: Secondary | ICD-10-CM | POA: Diagnosis not present

## 2021-02-12 ENCOUNTER — Telehealth: Payer: Self-pay

## 2021-02-12 ENCOUNTER — Other Ambulatory Visit: Payer: Self-pay | Admitting: Neurosurgery

## 2021-02-12 NOTE — Telephone Encounter (Signed)
   Patient Name: [name]  DOB: 09-04-1946  MRN: [id number]   Primary Cardiologist: Quay Burow, MD  Chart reviewed as part of pre-operative protocol coverage. Patient has never been seen by [place] in the office. She was seen by [place] in the hospital in 02/2020. Therefore,  she will require a follow-up visit in order to better assess preoperative cardiovascular risk.  Pre-op covering staff: - Please schedule appointment and call patient to inform them. If patient already had an upcoming appointment within acceptable timeframe, please add "pre-op clearance" to the appointment notes so provider is aware. - Please contact requesting surgeon's office via preferred method (i.e, phone, fax) to inform them of need for appointment prior to surgery.  I go ahead and route this to pharmacy pool for input on holding Xarelto so that this information is available to the clearing provider at time of patient's appointment.   Will remove from pre-op pool.  Darreld Mclean, PA-C  02/12/2021, 12:34 PM

## 2021-02-12 NOTE — Telephone Encounter (Signed)
Called the requesting office and spoke with Dominica. I informed her that we received the cardiac clearance request and that patient is scheduled to see Dr. Gwenlyn Found on Friday 03/16/21 at 10:45 AM. I stated that unfortunately that the appointment is scheduled after the 03/01/21 scheduled surgery date and that the procedure would have to be re-scheduled due to pending clearance. Lorriane Shire asked if I could speak with Dr. Gwenlyn Found or someone to asked if there is anyway the patient could be seen sooner as Urgent so patient can keep scheduled date for procedure.  I informed her that I would send a note to Dr. Gwenlyn Found and his nurse and that the patient and her husband was also made aware that they will be placed on the wait list for if anything sooner becomes available. She thanked me for calling and asked that I please make sure that Dr. Gwenlyn Found and his nurse know that a sooner appointment would be greatly appreciated.

## 2021-02-12 NOTE — Telephone Encounter (Signed)
   [facility] Pre-operative Risk Assessment    Patient Name: [name]  DOB: 12-06-1945  MRN: [id number]   HEARTCARE STAFF: - Please ensure there is not already an duplicate clearance open for this procedure. - Under Visit Info/Reason for Call, type in Other and utilize the format Clearance MM/DD/YY or Clearance TBD. Do not use dashes or single digits. - If request is for dental extraction, please clarify the # of teeth to be extracted.  Request for surgical clearance:  1. What type of surgery is being performed? T6-T7 Kyphplasty   2. When is this surgery scheduled? 03/01/21  3. What type of clearance is required (medical clearance vs. Pharmacy clearance to hold med vs. Both)? Both   4. Are there any medications that need to be held prior to surgery and how long? Xarelto   5. Practice name and name of physician performing surgery? [facility], Duffy Rhody, MD  6. What is the office phone number? [phone]   7.   What is the office fax number? [phone] Attn: Lorriane Shire   8.   Anesthesia type (None, local, MAC, general) ? General    Jacqulynn Cadet 02/12/2021, 12:12 PM  _________________________________________________________________   (provider comments below)

## 2021-02-12 NOTE — Telephone Encounter (Signed)
Called patient and informed her reason for call. Patient is scheduled to see Dr. Gwenlyn Found on 03/16/21 at 10:45 AM as a New Patient. Patient and her husband verbalized understanding and asked if there were any sooner appointment slots to please give them a call.

## 2021-02-12 NOTE — Telephone Encounter (Signed)
New patient appt schedule for 04/06 with Dr Harrell Gave

## 2021-02-14 ENCOUNTER — Other Ambulatory Visit: Payer: Self-pay

## 2021-02-14 ENCOUNTER — Ambulatory Visit: Payer: Medicare HMO | Admitting: Cardiology

## 2021-02-14 ENCOUNTER — Encounter: Payer: Self-pay | Admitting: Cardiology

## 2021-02-14 VITALS — BP 148/72 | HR 73 | Ht 62.0 in | Wt 143.4 lb

## 2021-02-14 DIAGNOSIS — I10 Essential (primary) hypertension: Secondary | ICD-10-CM

## 2021-02-14 DIAGNOSIS — G08 Intracranial and intraspinal phlebitis and thrombophlebitis: Secondary | ICD-10-CM | POA: Diagnosis not present

## 2021-02-14 DIAGNOSIS — D473 Essential (hemorrhagic) thrombocythemia: Secondary | ICD-10-CM

## 2021-02-14 DIAGNOSIS — I615 Nontraumatic intracerebral hemorrhage, intraventricular: Secondary | ICD-10-CM

## 2021-02-14 DIAGNOSIS — Z01810 Encounter for preprocedural cardiovascular examination: Secondary | ICD-10-CM | POA: Diagnosis not present

## 2021-02-14 DIAGNOSIS — I480 Paroxysmal atrial fibrillation: Secondary | ICD-10-CM

## 2021-02-14 NOTE — Patient Instructions (Signed)
Medication Instructions:  No Changes In Medications at this time.  *If you need a refill on your cardiac medications before your next appointment, please call your pharmacy*  Follow-Up: At [facility], you and your health needs are our priority.  As part of our continuing mission to provide you with exceptional heart care, we have created designated Provider Care Teams.  These Care Teams include your primary Cardiologist (physician) and Advanced Practice Providers (APPs -  Physician Assistants and Nurse Practitioners) who all work together to provide you with the care you need, when you need it.  Your next appointment:   AS NEEDED   The format for your next appointment:   In Person  Provider:   Buford Dresser, MD

## 2021-02-14 NOTE — Progress Notes (Signed)
Cardiology Office Note:    Date:  02/14/2021   ID:  [name], DOB August 29, 1946, MRN [id number]  PCP:  Cari Caraway, MD  Cardiologist:  Buford Dresser, MD  Referring MD: Cari Caraway, MD   CC: patient visit for preoperative cardiovascular exam  History of Present Illness:    [name] is a 75 y.o. female with a hx of hypertension, hyperlipidemia, intracranial hemorrhage in the setting of severe thrombocytosis, bilateral PE and extensive thrombosis 02/2020, paroxysmal atrial fib/flutter who is seen as a new consult at the request of Cari Caraway, MD for the evaluation and management of preoperative cardiovascular evaluation.  She was initially seen by Dr. Gwenlyn Found in the hospital in 02/2020 and has not been seen as an outpatient. She needed urgent appt prior to her scheduled surgery on 03/01/21.  Planned surgery: T6-T7 kyphoplasty, 03/01/21, Dr. Marcello Moores  Pertinent past cardiac history: single episode of atrial fibrillation with RVR while hospitalized 02/2020. Prior cardiac workup: echo, most recent 02/2020. Thinks she may have done a treadmill many years ago, was not told it was abnormal. History of valve disease: none, aortic sclerosis without stenosis noted on echo 02/2020 History of CAD/PAD/CVA/TIA: yes, history of ICH/thrombosis as above 02/2020. Reported TIA in 2015 as well. History of heart failure: none, last EF 02/2020 65-70%. History of arrhythmia: atrial fibrillation with RVR during hospitalization, no other events since On anticoagulation: yes, rivaroxaban, see below History of hypertension: yes, on amlodipine and losartan. Had been noticing lower blood pressures in the past, but no syncope. Has been on stable dosing for about 8 months. Checks blood pressures at home, runs 120s/70s. In pain during visit today. History of diabetes: none History of CKD: none, last GFR >60 History of OSA: none History of anesthesia complications: none Current symptoms: Denies chest  pain, shortness of breath at rest or with normal exertion. No PND, orthopnea, LE edema or unexpected weight gain. No syncope or palpitations. Functional capacity: walks a mild daily without limitations. No shortness of breath unless walking uphill--notes that pain in her back limits her breathing. Climbs stairs at home without issues. Pertinent additional information: polycythemia vera vs. Thrombocytosis with JAK2 mutation followed by Dr. Jana Hakim. History of HIT. On hydroxyurea and rivaroxaban. Dr. Leonie Man manages her anticoagulation.  Past Medical History:  Diagnosis Date  . Hepatitis C   . Hyperlipidemia   . Hypertension   . Osteoporosis   . Stroke ([place])   . Thrombocytosis     Past Surgical History:  Procedure Laterality Date  . IR ANGIO INTRA EXTRACRAN SEL INTERNAL CAROTID BILAT MOD SED  02/24/2020  . IR ANGIO VERTEBRAL SEL VERTEBRAL UNI R MOD SED  02/24/2020  . IR ANGIOGRAM SELECTIVE EACH ADDITIONAL VESSEL  02/24/2020  . IR IVC FILTER PLMT / S&I /IMG GUID/MOD SED  02/22/2020  . IR IVC FILTER RETRIEVAL / S&I /IMG GUID/MOD SED  06/27/2020  . IR RADIOLOGIST EVAL & MGMT  05/23/2020  . IR THROMBECT VENO MECH MOD SED  02/24/2020  . IR US GUIDE VASC ACCESS RIGHT  02/24/2020  . IR US GUIDE VASC ACCESS RIGHT  02/24/2020  . IR VENO SAGITTAL SINUS  02/24/2020  . IR VENO/JUGULAR RIGHT  02/24/2020  . RADIOLOGY WITH ANESTHESIA N/A 02/24/2020   Procedure: IR WITH ANESTHESIA;  Surgeon: Radiologist, Medication, MD;  Location: [place];  Service: Radiology;  Laterality: N/A;    Current Medications: Current Outpatient Medications on File Prior to Visit  Medication Sig  . acetaminophen (TYLENOL) 500 MG tablet Take  1,000 mg by mouth every 6 (six) hours as needed for moderate pain or headache.  Marland Kitchen amLODipine (NORVASC) 10 MG tablet Take 10 mg by mouth daily.   Marland Kitchen Apoaequorin (PREVAGEN) 10 MG CAPS Take 1 capsule by mouth every morning.  Marland Kitchen atorvastatin (LIPITOR) 10 MG tablet Take 1 tablet (10 mg total) by mouth daily.  (Patient taking differently: Take 10 mg by mouth every evening.)  . Cyanocobalamin (B-12) 5000 MCG SUBL   . docusate sodium (COLACE) 100 MG capsule Take 100 mg by mouth daily.  Marland Kitchen HYDROcodone-acetaminophen (NORCO/VICODIN) 5-325 MG tablet Take 1 tablet by mouth every 6 (six) hours as needed for severe pain.  . hydroxyurea (HYDREA) 500 MG capsule Take 1 capsule (500 mg total) by mouth daily. May take with food to minimize GI side effects.  Marland Kitchen levothyroxine (SYNTHROID) 100 MCG tablet Take 100 mcg by mouth daily before breakfast.  . losartan (COZAAR) 50 MG tablet Take 1 tablet (50 mg total) by mouth 2 (two) times daily.  . pantoprazole (PROTONIX) 20 MG tablet Take 20 mg by mouth daily.  . rivaroxaban (XARELTO) 20 MG TABS tablet Take 1 tablet (20 mg total) by mouth daily with breakfast.   No current facility-administered medications on file prior to visit.     Allergies:   Heparin, Amoxicillin-pot clavulanate, and Erythromycin   Social History   Tobacco Use  . Smoking status: Former Research scientist (life sciences)  . Smokeless tobacco: Never Used  Vaping Use  . Vaping Use: Never used  Substance Use Topics  . Alcohol use: Yes    Alcohol/week: 1.0 standard drink    Types: 1 Glasses of wine per week    Comment: nightly  . Drug use: No    Family History: family history includes Heart disease in her father and mother.  ROS:   Please see the history of present illness.  Additional pertinent ROS: Constitutional: Negative for chills, fever, night sweats, unintentional weight loss  HENT: Negative for ear pain and hearing loss.   Eyes: Negative for loss of vision and eye pain.  Respiratory: Negative for cough, sputum, wheezing.   Cardiovascular: See HPI. Gastrointestinal: Negative for abdominal pain, melena, and hematochezia.  Genitourinary: Negative for dysuria and hematuria.  Musculoskeletal: Negative for falls and myalgias.  Skin: Negative for itching and rash.  Neurological: Negative for focal weakness, focal  sensory changes and loss of consciousness.  Endo/Heme/Allergies: Does bruise/bleed easily on DOAC.     EKGs/Labs/Other Studies Reviewed:    The following studies were reviewed today: Echo 02/14/20 1. Left ventricular ejection fraction, by estimation, is 65 to 70%. The  left ventricle has normal function. The left ventricle has no regional  wall motion abnormalities. Left ventricular diastolic parameters were  normal.  2. Right ventricular systolic function is normal. The right ventricular  size is normal. Tricuspid regurgitation signal is inadequate for assessing  PA pressure.  3. The mitral valve is grossly normal. No evidence of mitral valve  regurgitation. No evidence of mitral stenosis.  4. The aortic valve is tricuspid. Aortic valve regurgitation is not  visualized. Mild aortic valve sclerosis is present, with no evidence of  aortic valve stenosis.  5. The inferior vena cava is normal in size with greater than 50%  respiratory variability, suggesting right atrial pressure of 3 mmHg.   EKG:  EKG is personally reviewed.  The ekg ordered 02/14/21 demonstrates NSR at 73 bpm.  Recent Labs: 02/29/2020: Magnesium 2.1; TSH 5.311 11/29/2020: ALT 19; BUN 13; Creatinine, Ser 0.94; Hemoglobin 11.9;  Platelets 190; Potassium 4.2; Sodium 143  Recent Lipid Panel    Component Value Date/Time   CHOL 172 02/14/2020 0959   TRIG 81 03/27/2020 0517   HDL 71 02/14/2020 0959   CHOLHDL 2.4 02/14/2020 0959   VLDL 9 02/14/2020 0959   LDLCALC 92 02/14/2020 0959    Physical Exam:    VS:  BP (!) 148/72   Pulse 73   Ht 5\' 2"  (1.575 m)   Wt 143 lb 6.4 oz (65 kg)   SpO2 98%   BMI 26.23 kg/m     Wt Readings from Last 3 Encounters:  02/14/21 143 lb 6.4 oz (65 kg)  12/15/20 140 lb (63.5 kg)  09/06/20 142 lb 9.6 oz (64.7 kg)    GEN: Well nourished, well developed in no acute distress HEENT: Normal, moist mucous membranes NECK: No JVD CARDIAC: regular rhythm, normal S1 and S2, no rubs or  gallops. 1/6 systolic murmur. VASCULAR: Radial and DP pulses 2+ bilaterally. No carotid bruits RESPIRATORY:  Clear to auscultation without rales, wheezing or rhonchi  ABDOMEN: Soft, non-tender, non-distended MUSCULOSKELETAL:  Ambulates independently SKIN: Warm and dry, no edema NEUROLOGIC:  Alert and oriented x 3. No focal neuro deficits noted. PSYCHIATRIC:  Normal affect    ASSESSMENT:    1. Preop cardiovascular exam   2. Benign essential HTN   3. Essential thrombocytosis ([place])   4. IVH (intraventricular hemorrhage) (H[facility])   5. Cerebral venous sinus thrombosis   6. Paroxysmal atrial fibrillation ([facility])    PLAN:    Preoperative cardiovascular evaluation: Based on available date, patient's RCRI score = 0, which carries a 3.9% 30-day risk of death, MI, or cardiac arrest.  The patient is not currently having active cardiac symptoms, and they can achieve >4 METs of activity.  According to ACC/AHA Guidelines, no further testing is needed.  Proceed with surgery at acceptable risk.  Our service is available as needed in the peri-operative period.    Hypertension: in pain today, but well controlled at home -continue current medications  Paroxysmal atrial fibrillation: CHA2DS2/VAS Stroke Risk Points=5 -she has not had an event other than afib RVR during her complex hospitalization in 02/2020. She is currently anticoagulated for other indications (see below). If anticoagulation is planned to be stopped in the future, then we would need to discuss pros/cons of anticoagulation for atrial fibrillation.   History of essential thrombosis/JAK2 mutation, HITT including extensive thrombosis, bilateral PE: -hydroxyurea management per Dr. Jana Hakim -has been on rivaroxaban per neurology, based on Dr. Virgie Dad last note there was discussion of stopping this based on decision by neurology to be determined. Would defer to Dr. Clydene Fake team regarding perioperative management of rivaroxaban.  Cardiac risk  counseling and prevention recommendations: -recommend heart healthy/Mediterranean diet, with whole grains, fruits, vegetable, fish, lean meats, nuts, and olive oil. Limit salt. -recommend moderate walking, 3-5 times/week for 30-50 minutes each session. Aim for at least 150 minutes.week. Goal should be pace of 3 miles/hours, or walking 1.5 miles in 30 minutes -recommend avoidance of tobacco products. Avoid excess alcohol.  Plan for follow up: I would be happy to see her back as needed  Total time of encounter: 46 minutes total time of encounter, including 27 minutes spent in face-to-face patient care. This time includes coordination of care and counseling regarding preoperative cardiovascular risk. Remainder of non-face-to-face time involved reviewing chart documents/testing relevant to the patient encounter and documentation in the medical record.  Buford Dresser, MD, PhD, [place]  Medication Adjustments/Labs and Tests Ordered: Current medicines are reviewed at length with the patient today.  Concerns regarding medicines are outlined above.  Orders Placed This Encounter  Procedures  . EKG 12-Lead   No orders of the defined types were placed in this encounter.   Patient Instructions  Medication Instructions:  No Changes In Medications at this time.  *If you need a refill on your cardiac medications before your next appointment, please call your pharmacy*  Follow-Up: At [facility], you and your health needs are our priority.  As part of our continuing mission to provide you with exceptional heart care, we have created designated Provider Care Teams.  These Care Teams include your primary Cardiologist (physician) and Advanced Practice Providers (APPs -  Physician Assistants and Nurse Practitioners) who all work together to provide you with the care you need, when you need it.  Your next appointment:   AS NEEDED   The format for your next  appointment:   In Person  Provider:   Buford Dresser, MD    Signed, Buford Dresser, MD PhD 02/14/2021 2:50 PM    [place]

## 2021-02-20 DIAGNOSIS — Z0000 Encounter for general adult medical examination without abnormal findings: Secondary | ICD-10-CM | POA: Diagnosis not present

## 2021-02-20 DIAGNOSIS — Z1389 Encounter for screening for other disorder: Secondary | ICD-10-CM | POA: Diagnosis not present

## 2021-02-21 ENCOUNTER — Other Ambulatory Visit: Payer: Self-pay

## 2021-02-21 ENCOUNTER — Inpatient Hospital Stay: Payer: Medicare HMO | Attending: Oncology

## 2021-02-21 DIAGNOSIS — D45 Polycythemia vera: Secondary | ICD-10-CM | POA: Insufficient documentation

## 2021-02-21 LAB — CBC WITH DIFFERENTIAL/PLATELET
Abs Immature Granulocytes: 0.02 10*3/uL (ref 0.00–0.07)
Basophils Absolute: 0 10*3/uL (ref 0.0–0.1)
Basophils Relative: 0 %
Eosinophils Absolute: 0 10*3/uL (ref 0.0–0.5)
Eosinophils Relative: 1 %
HCT: 34.9 % — ABNORMAL LOW (ref 36.0–46.0)
Hemoglobin: 11.9 g/dL — ABNORMAL LOW (ref 12.0–15.0)
Immature Granulocytes: 0 %
Lymphocytes Relative: 36 %
Lymphs Abs: 1.8 10*3/uL (ref 0.7–4.0)
MCH: 38.5 pg — ABNORMAL HIGH (ref 26.0–34.0)
MCHC: 34.1 g/dL (ref 30.0–36.0)
MCV: 112.9 fL — ABNORMAL HIGH (ref 80.0–100.0)
Monocytes Absolute: 0.4 10*3/uL (ref 0.1–1.0)
Monocytes Relative: 7 %
Neutro Abs: 2.8 10*3/uL (ref 1.7–7.7)
Neutrophils Relative %: 56 %
Platelets: 171 10*3/uL (ref 150–400)
RBC: 3.09 MIL/uL — ABNORMAL LOW (ref 3.87–5.11)
RDW: 13.7 % (ref 11.5–15.5)
WBC: 5 10*3/uL (ref 4.0–10.5)
nRBC: 0 % (ref 0.0–0.2)

## 2021-02-21 LAB — RETICULOCYTES
Immature Retic Fract: 13.3 % (ref 2.3–15.9)
RBC.: 3.14 MIL/uL — ABNORMAL LOW (ref 3.87–5.11)
Retic Count, Absolute: 53.4 10*3/uL (ref 19.0–186.0)
Retic Ct Pct: 1.7 % (ref 0.4–3.1)

## 2021-02-21 LAB — COMPREHENSIVE METABOLIC PANEL
ALT: 26 U/L (ref 0–44)
AST: 22 U/L (ref 15–41)
Albumin: 4.4 g/dL (ref 3.5–5.0)
Alkaline Phosphatase: 86 U/L (ref 38–126)
Anion gap: 12 (ref 5–15)
BUN: 20 mg/dL (ref 8–23)
CO2: 24 mmol/L (ref 22–32)
Calcium: 9.8 mg/dL (ref 8.9–10.3)
Chloride: 107 mmol/L (ref 98–111)
Creatinine, Ser: 0.96 mg/dL (ref 0.44–1.00)
GFR, Estimated: >60 mL/min (ref >60)
Glucose, Bld: 120 mg/dL — ABNORMAL HIGH (ref 70–99)
Potassium: 4.4 mmol/L (ref 3.5–5.1)
Sodium: 143 mmol/L (ref 135–145)
Total Bilirubin: 0.7 mg/dL (ref 0.3–1.2)
Total Protein: 7.1 g/dL (ref 6.5–8.1)

## 2021-02-21 LAB — IRON AND TIBC
Iron: 110 ug/dL (ref 41–142)
Saturation Ratios: 31 % (ref 21–57)
TIBC: 350 ug/dL (ref 236–444)
UIBC: 240 ug/dL (ref 120–384)

## 2021-02-21 LAB — FERRITIN: Ferritin: 85 ng/mL (ref 11–307)

## 2021-02-22 ENCOUNTER — Other Ambulatory Visit: Payer: Self-pay | Admitting: Neurosurgery

## 2021-02-22 DIAGNOSIS — S22050K Wedge compression fracture of T5-T6 vertebra, subsequent encounter for fracture with nonunion: Secondary | ICD-10-CM

## 2021-02-23 ENCOUNTER — Other Ambulatory Visit: Payer: Self-pay

## 2021-02-23 ENCOUNTER — Ambulatory Visit
Admission: RE | Admit: 2021-02-23 | Discharge: 2021-02-23 | Disposition: A | Discharge status: Home / Self Care | Payer: Medicare HMO | Source: Ambulatory Visit | Attending: Neurosurgery | Admitting: Neurosurgery

## 2021-02-23 DIAGNOSIS — M4804 Spinal stenosis, thoracic region: Secondary | ICD-10-CM | POA: Diagnosis not present

## 2021-02-23 DIAGNOSIS — S22050K Wedge compression fracture of T5-T6 vertebra, subsequent encounter for fracture with nonunion: Secondary | ICD-10-CM

## 2021-02-23 DIAGNOSIS — S22050A Wedge compression fracture of T5-T6 vertebra, initial encounter for closed fracture: Secondary | ICD-10-CM | POA: Diagnosis not present

## 2021-02-23 DIAGNOSIS — M5124 Other intervertebral disc displacement, thoracic region: Secondary | ICD-10-CM | POA: Diagnosis not present

## 2021-02-23 DIAGNOSIS — Z01818 Encounter for other preprocedural examination: Secondary | ICD-10-CM | POA: Diagnosis not present

## 2021-02-26 NOTE — Pre-Procedure Instructions (Signed)
Surgical Instructions    Your procedure is scheduled on Thursday, April 21st.  Report to [facility] Main Entrance "A" at 5:30 A.M., then check in with the Admitting office.  Call this number if you have problems the morning of surgery:  [phone]   If you have any questions prior to your surgery date call [phone]: Open Monday-Friday 8am-4pm    Remember:  Do not eat or drink after midnight the night before your surgery    Take these medicines the morning of surgery with A SIP OF WATER: amLODipine (NORVASC)  levothyroxine (SYNTHROID)  pantoprazole (PROTONIX)  hydroxyurea (HYDREA) acetaminophen (TYLENOL)-as needed for pain  Follow your surgeon's instructions on when to stop rivaroxaban (XARELTO).  If no instructions were given by your surgeon then you will need to call the office to get those instructions.    As of today, STOP taking any Aspirin (unless otherwise instructed by your surgeon) Aleve, Naproxen, Ibuprofen, Motrin, Advil, Goody's, BC's, all herbal medications, fish oil, and all vitamins.                     Do NOT Smoke (Tobacco/Vaping) or drink Alcohol 24 hours prior to your procedure.  If you use a CPAP at night, you may bring all equipment for your overnight stay.   Contacts, glasses, piercing's, hearing aid's, dentures or partials may not be worn into surgery, please bring cases for these belongings.    For patients admitted to the hospital, discharge time will be determined by your treatment team.   Patients discharged the day of surgery will not be allowed to drive home, and someone needs to stay with them for 24 hours.    Special instructions:   [place]- Preparing For Surgery  Before surgery, you can play an important role. Because skin is not sterile, your skin needs to be as free of germs as possible. You can reduce the number of germs on your skin by washing with CHG (chlorahexidine gluconate) Soap before surgery.  CHG is an antiseptic cleaner  which kills germs and bonds with the skin to continue killing germs even after washing.    Oral Hygiene is also important to reduce your risk of infection.  Remember - BRUSH YOUR TEETH THE MORNING OF SURGERY WITH YOUR REGULAR TOOTHPASTE  Please do not use if you have an allergy to CHG or antibacterial soaps. If your skin becomes reddened/irritated stop using the CHG.  Do not shave (including legs and underarms) for at least 48 hours prior to first CHG shower. It is OK to shave your face.  Please follow these instructions carefully.   1. Shower the NIGHT BEFORE SURGERY and the MORNING OF SURGERY  2. If you chose to wash your hair, wash your hair first as usual with your normal shampoo.  3. After you shampoo, rinse your hair and body thoroughly to remove the shampoo.  4. Use CHG Soap as you would any other liquid soap. You can apply CHG directly to the skin and wash gently with a scrungie or a clean washcloth.   5. Apply the CHG Soap to your body ONLY FROM THE NECK DOWN.  Do not use on open wounds or open sores. Avoid contact with your eyes, ears, mouth and genitals (private parts). Wash Face and genitals (private parts)  with your normal soap.   6. Wash thoroughly, paying special attention to the area where your surgery will be performed.  7. Thoroughly rinse your body with warm water from  the neck down.  8. DO NOT shower/wash with your normal soap after using and rinsing off the CHG Soap.  9. Pat yourself dry with a CLEAN TOWEL.  10. Wear CLEAN PAJAMAS to bed the night before surgery  11. Place CLEAN SHEETS on your bed the night before your surgery  12. DO NOT SLEEP WITH PETS.   Day of Surgery: Shower with CHG soap. Do not wear jewelry, make up, or nail polish Do not wear lotions, powders, perfumes, or deodorant. Do not shave 48 hours prior to surgery.   Do not bring valuables to the hospital. [name] is not responsible for any belongings or valuables. Wear  Clean/Comfortable clothing the morning of surgery Remember to brush your teeth WITH YOUR REGULAR TOOTHPASTE.   Please read over the following fact sheets that you were given.

## 2021-02-27 ENCOUNTER — Encounter (HOSPITAL_COMMUNITY)
Admission: RE | Admit: 2021-02-27 | Discharge: 2021-02-27 | Disposition: A | Discharge status: Home / Self Care | Payer: Medicare HMO | Source: Ambulatory Visit | Attending: Neurosurgery | Admitting: Neurosurgery

## 2021-02-27 ENCOUNTER — Other Ambulatory Visit: Payer: Self-pay

## 2021-02-27 ENCOUNTER — Encounter (HOSPITAL_COMMUNITY): Payer: Self-pay

## 2021-02-27 DIAGNOSIS — Z01812 Encounter for preprocedural laboratory examination: Secondary | ICD-10-CM | POA: Diagnosis not present

## 2021-02-27 DIAGNOSIS — Z20822 Contact with and (suspected) exposure to covid-19: Secondary | ICD-10-CM | POA: Diagnosis not present

## 2021-02-27 HISTORY — DX: Chronic obstructive pulmonary disease, unspecified: J44.9

## 2021-02-27 HISTORY — DX: Unspecified osteoarthritis, unspecified site: M19.90

## 2021-02-27 LAB — SURGICAL PCR SCREEN
MRSA, PCR: NEGATIVE
Staphylococcus aureus: NEGATIVE

## 2021-02-27 LAB — SARS CORONAVIRUS 2 (TAT 6-24 HRS): SARS Coronavirus 2: NEGATIVE

## 2021-02-27 NOTE — Progress Notes (Signed)
PCP - Cari Caraway  Cardiologist - Buford Dresser  PPM/ICD - n/a Device Orders - n/a Rep Notified - n/a  Chest x-ray - n/a EKG - 02/14/21 Stress Test - denies ECHO - 02/14/20 Cardiac Cath - denies  Sleep Study - denies CPAP - n/a  Blood Thinner Instructions: Patient states she was instructed to take last dose of Xarelto on 02/26/21 Aspirin Instructions: n/a   COVID TEST-  02/27/21- Pending   Anesthesia review: Yes. Cardiac Clearance 02/14/21  Patient denies shortness of breath, fever, cough and chest pain at PAT appointment   All instructions explained to the patient, with a verbal understanding of the material. Patient agrees to go over the instructions while at home for a better understanding. Patient also instructed to self quarantine after being tested for COVID-19. The opportunity to ask questions was provided.

## 2021-02-28 ENCOUNTER — Ambulatory Visit: Payer: Medicare HMO | Admitting: Neurology

## 2021-02-28 ENCOUNTER — Encounter (HOSPITAL_COMMUNITY): Payer: Self-pay | Admitting: Certified Registered Nurse Anesthetist

## 2021-02-28 NOTE — Anesthesia Preprocedure Evaluation (Addendum)
Anesthesia Evaluation  Patient identified by MRN, date of birth, ID band Patient awake    Reviewed: Allergy & Precautions, NPO status , Patient's Chart, lab work & pertinent test results  History of Anesthesia Complications Negative for: history of anesthetic complications  Airway Mallampati: II  TM Distance: >3 FB Neck ROM: Full    Dental  (+) Dental Advisory Given, Teeth Intact   Pulmonary COPD, former smoker, PE   Pulmonary exam normal        Cardiovascular hypertension, Pt. on medications Normal cardiovascular exam   '21 TTE - EF 65 to 70%. Mild aortic valve sclerosis is present, with no evidence of aortic valve stenosis.     Neuro/Psych  Headaches, TIACVA (memory problems), Residual Symptoms negative psych ROS   GI/Hepatic GERD  Medicated and Controlled,(+) Hepatitis -, C  Endo/Other  Hypothyroidism   Renal/GU negative Renal ROS     Musculoskeletal  (+) Arthritis ,   Abdominal   Peds  Hematology  (+) anemia ,  On xarelto    Anesthesia Other Findings Covid test negative See PAT note re: cardiology and hematology   Reproductive/Obstetrics                           Anesthesia Physical Anesthesia Plan  ASA: III  Anesthesia Plan: General   Post-op Pain Management:    Induction: Intravenous  PONV Risk Score and Plan: 4 or greater and Treatment may vary due to age or medical condition, Ondansetron, Dexamethasone and Aprepitant  Airway Management Planned: Oral ETT  Additional Equipment: None  Intra-op Plan:   Post-operative Plan: Extubation in OR  Informed Consent: I have reviewed the patients History and Physical, chart, labs and discussed the procedure including the risks, benefits and alternatives for the proposed anesthesia with the patient or authorized representative who has indicated his/her understanding and acceptance.     Dental advisory given  Plan Discussed  with: CRNA and Anesthesiologist  Anesthesia Plan Comments:       Anesthesia Quick Evaluation

## 2021-02-28 NOTE — Progress Notes (Signed)
Anesthesia Chart Review:  Recently established with cardiologist Dr. Harrell Gave for history of hypertension, hyperlipidemia, intracranial hemorrhage in the setting of severe thrombocytosis, bilateral PE and extensive thrombosis 02/2020, paroxysmal atrial fib/flutter she was seen 02/14/2021 for preoperative evaluation.  Per note, "Preoperative cardiovascular evaluation: Based on available date, patient's RCRI score = 0, which carries a 3.9% 30-day risk of death, MI, or cardiac arrest. The patient is not currently having active cardiac symptoms, and they can achieve >4 METs of activity. According to ACC/AHA Guidelines, no further testing is needed.  Proceed with surgery at acceptable risk.  Our service is available as needed in the peri-operative period."  Patient reports last dose Xarelto 02/26/2021.  Patient follows with hematology for history of essential thrombosis/Jak 2 mutation, HITT with history of extensive thrombosis and bilateral PE.  She is followed by Dr. Jana Hakim for this who manages her hydroxyurea.  She is on Xarelto for history of cerebral venous sinus thrombosis and this is managed by neurologist Dr. Leonie Man.  History of IVC filter placement, this was removed by Dr. Earleen Newport on 06/27/2020.  CMP and CBC from 02/21/2021 reviewed, unremarkable.  EKG 02/14/2021: NSR.  Rate 73.  CT chest 12/15/2020: IMPRESSION: 1. Probable acute or subacute compression fracture of T6 with approximately 40% loss of vertebral body height. Correlation with point tenderness recommended. 2. Chronic compression fracture of T10 with approximately 40% loss of vertebral body height. 3. Bilateral lower lobe and bibasilar streaky densities may represent atelectasis/scarring. Atypical infiltrate is less likely but not excluded clinical correlation is recommended. 4. Aortic Atherosclerosis (ICD10-I70.0) and Emphysema (ICD10-J43.9).  TTE 02/14/2020: 1. Left ventricular ejection fraction, by estimation, is 65 to 70%. The   left ventricle has normal function. The left ventricle has no regional  wall motion abnormalities. Left ventricular diastolic parameters were  normal.  2. Right ventricular systolic function is normal. The right ventricular  size is normal. Tricuspid regurgitation signal is inadequate for assessing  PA pressure.  3. The mitral valve is grossly normal. No evidence of mitral valve  regurgitation. No evidence of mitral stenosis.  4. The aortic valve is tricuspid. Aortic valve regurgitation is not  visualized. Mild aortic valve sclerosis is present, with no evidence of  aortic valve stenosis.  5. The inferior vena cava is normal in size with greater than 50%  respiratory variability, suggesting right atrial pressure of 3 mmHg.    Wynonia Musty [facility] Short Stay Center/Anesthesiology Phone [phone] 02/28/2021 8:50 AM

## 2021-03-01 ENCOUNTER — Ambulatory Visit (HOSPITAL_COMMUNITY): Payer: Medicare HMO | Admitting: Physician Assistant

## 2021-03-01 ENCOUNTER — Ambulatory Visit (HOSPITAL_COMMUNITY): Payer: Medicare HMO

## 2021-03-01 ENCOUNTER — Encounter (HOSPITAL_COMMUNITY)
Admission: RE | Disposition: A | Discharge status: Home / Self Care | Payer: Self-pay | Source: Home / Self Care | Attending: Neurosurgery

## 2021-03-01 ENCOUNTER — Ambulatory Visit (HOSPITAL_COMMUNITY)
Admission: RE | Admit: 2021-03-01 | Discharge: 2021-03-01 | Disposition: A | Discharge status: Home / Self Care | Payer: Medicare HMO | Attending: Neurosurgery | Admitting: Neurosurgery

## 2021-03-01 DIAGNOSIS — Z419 Encounter for procedure for purposes other than remedying health state, unspecified: Secondary | ICD-10-CM

## 2021-03-01 DIAGNOSIS — Z881 Allergy status to other antibiotic agents status: Secondary | ICD-10-CM | POA: Diagnosis not present

## 2021-03-01 DIAGNOSIS — Z87891 Personal history of nicotine dependence: Secondary | ICD-10-CM | POA: Insufficient documentation

## 2021-03-01 DIAGNOSIS — Z7901 Long term (current) use of anticoagulants: Secondary | ICD-10-CM | POA: Diagnosis not present

## 2021-03-01 DIAGNOSIS — M8468XA Pathological fracture in other disease, other site, initial encounter for fracture: Secondary | ICD-10-CM | POA: Diagnosis not present

## 2021-03-01 DIAGNOSIS — D638 Anemia in other chronic diseases classified elsewhere: Secondary | ICD-10-CM | POA: Diagnosis not present

## 2021-03-01 DIAGNOSIS — Z79899 Other long term (current) drug therapy: Secondary | ICD-10-CM | POA: Diagnosis not present

## 2021-03-01 DIAGNOSIS — Z981 Arthrodesis status: Secondary | ICD-10-CM | POA: Diagnosis not present

## 2021-03-01 DIAGNOSIS — M4854XA Collapsed vertebra, not elsewhere classified, thoracic region, initial encounter for fracture: Secondary | ICD-10-CM | POA: Diagnosis not present

## 2021-03-01 DIAGNOSIS — Z79890 Hormone replacement therapy (postmenopausal): Secondary | ICD-10-CM | POA: Insufficient documentation

## 2021-03-01 DIAGNOSIS — M40204 Unspecified kyphosis, thoracic region: Secondary | ICD-10-CM | POA: Diagnosis not present

## 2021-03-01 DIAGNOSIS — M4804 Spinal stenosis, thoracic region: Secondary | ICD-10-CM | POA: Insufficient documentation

## 2021-03-01 DIAGNOSIS — S22060A Wedge compression fracture of T7-T8 vertebra, initial encounter for closed fracture: Secondary | ICD-10-CM | POA: Diagnosis not present

## 2021-03-01 DIAGNOSIS — S22050A Wedge compression fracture of T5-T6 vertebra, initial encounter for closed fracture: Secondary | ICD-10-CM | POA: Diagnosis not present

## 2021-03-01 DIAGNOSIS — I10 Essential (primary) hypertension: Secondary | ICD-10-CM | POA: Diagnosis not present

## 2021-03-01 DIAGNOSIS — Z888 Allergy status to other drugs, medicaments and biological substances status: Secondary | ICD-10-CM | POA: Diagnosis not present

## 2021-03-01 DIAGNOSIS — E039 Hypothyroidism, unspecified: Secondary | ICD-10-CM | POA: Diagnosis not present

## 2021-03-01 DIAGNOSIS — M8588 Other specified disorders of bone density and structure, other site: Secondary | ICD-10-CM | POA: Insufficient documentation

## 2021-03-01 HISTORY — PX: KYPHOPLASTY: SHX5884

## 2021-03-01 SURGERY — KYPHOPLASTY
Anesthesia: General

## 2021-03-01 MED ORDER — DEXAMETHASONE SODIUM PHOSPHATE 10 MG/ML IJ SOLN
INTRAMUSCULAR | Status: DC | PRN
  Administered 2021-03-01: 10 mg via INTRAVENOUS

## 2021-03-01 MED ORDER — VANCOMYCIN HCL IN DEXTROSE 1-5 GM/200ML-% IV SOLN
1000.0000 mg | INTRAVENOUS | Status: AC
  Administered 2021-03-01: 1000 mg via INTRAVENOUS

## 2021-03-01 MED ORDER — DEXMEDETOMIDINE (PRECEDEX) IN NS 20 MCG/5ML (4 MCG/ML) IV SYRINGE
PREFILLED_SYRINGE | INTRAVENOUS | Status: DC | PRN
  Administered 2021-03-01: 6 ug via INTRAVENOUS
  Administered 2021-03-01: 10 ug via INTRAVENOUS

## 2021-03-01 MED ORDER — GLYCOPYRROLATE PF 0.2 MG/ML IJ SOSY
PREFILLED_SYRINGE | INTRAMUSCULAR | Status: AC
  Filled 2021-03-01: qty 1

## 2021-03-01 MED ORDER — SUGAMMADEX SODIUM 200 MG/2ML IV SOLN: INTRAVENOUS | Status: DC | PRN

## 2021-03-01 MED ORDER — DEXAMETHASONE SODIUM PHOSPHATE 10 MG/ML IJ SOLN
INTRAMUSCULAR | Status: AC
  Filled 2021-03-01: qty 1

## 2021-03-01 MED ORDER — DEXMEDETOMIDINE (PRECEDEX) IN NS 20 MCG/5ML (4 MCG/ML) IV SYRINGE
PREFILLED_SYRINGE | INTRAVENOUS | Status: AC
  Filled 2021-03-01: qty 5

## 2021-03-01 MED ORDER — BUPIVACAINE HCL (PF) 0.5 % IJ SOLN
INTRAMUSCULAR | Status: AC
  Filled 2021-03-01: qty 30

## 2021-03-01 MED ORDER — CHLORHEXIDINE GLUCONATE CLOTH 2 % EX PADS: 6.0000 | MEDICATED_PAD | Freq: Once | CUTANEOUS | Status: DC

## 2021-03-01 MED ORDER — FENTANYL CITRATE (PF) 100 MCG/2ML IJ SOLN
INTRAMUSCULAR | Status: DC | PRN
  Administered 2021-03-01: 25 ug via INTRAVENOUS
  Administered 2021-03-01: 50 ug via INTRAVENOUS
  Administered 2021-03-01: 25 ug via INTRAVENOUS
  Administered 2021-03-01: 50 ug via INTRAVENOUS

## 2021-03-01 MED ORDER — ONDANSETRON HCL 4 MG/2ML IJ SOLN
INTRAMUSCULAR | Status: AC
  Filled 2021-03-01: qty 2

## 2021-03-01 MED ORDER — ORAL CARE MOUTH RINSE: 15.0000 mL | Freq: Once | OROMUCOSAL | Status: AC

## 2021-03-01 MED ORDER — VANCOMYCIN HCL IN DEXTROSE 1-5 GM/200ML-% IV SOLN
INTRAVENOUS | Status: AC
  Filled 2021-03-01: qty 200

## 2021-03-01 MED ORDER — PHENYLEPHRINE HCL-NACL 10-0.9 MG/250ML-% IV SOLN
INTRAVENOUS | Status: DC | PRN
  Administered 2021-03-01: 25 ug/min via INTRAVENOUS
  Administered 2021-03-01: 50 ug/min via INTRAVENOUS

## 2021-03-01 MED ORDER — LIDOCAINE 2% (20 MG/ML) 5 ML SYRINGE
INTRAMUSCULAR | Status: DC | PRN
  Administered 2021-03-01: 50 mg via INTRAVENOUS

## 2021-03-01 MED ORDER — ARTIFICIAL TEARS OPHTHALMIC OINT
TOPICAL_OINTMENT | OPHTHALMIC | Status: AC
  Filled 2021-03-01: qty 3.5

## 2021-03-01 MED ORDER — PROPOFOL 10 MG/ML IV BOLUS
INTRAVENOUS | Status: DC | PRN
  Administered 2021-03-01: 120 mg via INTRAVENOUS
  Administered 2021-03-01: 80 mg via INTRAVENOUS

## 2021-03-01 MED ORDER — 0.9 % SODIUM CHLORIDE (POUR BTL) OPTIME
TOPICAL | Status: DC | PRN
  Administered 2021-03-01: 1000 mL

## 2021-03-01 MED ORDER — FENTANYL CITRATE (PF) 250 MCG/5ML IJ SOLN
INTRAMUSCULAR | Status: AC
  Filled 2021-03-01: qty 5

## 2021-03-01 MED ORDER — ONDANSETRON HCL 4 MG/2ML IJ SOLN: 4.0000 mg | Freq: Once | INTRAMUSCULAR | Status: DC | PRN

## 2021-03-01 MED ORDER — APREPITANT 40 MG PO CAPS
40.0000 mg | ORAL_CAPSULE | ORAL | Status: AC
Start: 2021-03-01 — End: 2021-03-01
  Administered 2021-03-01: 40 mg via ORAL
  Filled 2021-03-01: qty 1

## 2021-03-01 MED ORDER — OXYCODONE HCL 5 MG/5ML PO SOLN: 5.0000 mg | Freq: Once | ORAL | Status: DC | PRN

## 2021-03-01 MED ORDER — LIDOCAINE-EPINEPHRINE 1 %-1:100000 IJ SOLN
INTRAMUSCULAR | Status: DC | PRN
  Administered 2021-03-01: 10 mL

## 2021-03-01 MED ORDER — CHLORHEXIDINE GLUCONATE 0.12 % MT SOLN
15.0000 mL | Freq: Once | OROMUCOSAL | Status: AC
  Administered 2021-03-01: 15 mL via OROMUCOSAL
  Filled 2021-03-01: qty 15

## 2021-03-01 MED ORDER — LIDOCAINE 2% (20 MG/ML) 5 ML SYRINGE
INTRAMUSCULAR | Status: AC
  Filled 2021-03-01: qty 5

## 2021-03-01 MED ORDER — FENTANYL CITRATE (PF) 100 MCG/2ML IJ SOLN: 25.0000 ug | INTRAMUSCULAR | Status: DC | PRN

## 2021-03-01 MED ORDER — LIDOCAINE-EPINEPHRINE 1 %-1:100000 IJ SOLN
INTRAMUSCULAR | Status: AC
  Filled 2021-03-01: qty 1

## 2021-03-01 MED ORDER — LACTATED RINGERS IV SOLN: INTRAVENOUS | Status: DC

## 2021-03-01 MED ORDER — OXYCODONE HCL 5 MG PO TABS: 5.0000 mg | ORAL_TABLET | Freq: Once | ORAL | Status: DC | PRN

## 2021-03-01 MED ORDER — BUPIVACAINE HCL (PF) 0.5 % IJ SOLN
INTRAMUSCULAR | Status: DC | PRN
  Administered 2021-03-01: 10 mL

## 2021-03-01 MED ORDER — ROCURONIUM BROMIDE 10 MG/ML (PF) SYRINGE
PREFILLED_SYRINGE | INTRAVENOUS | Status: DC | PRN
  Administered 2021-03-01: 20 mg via INTRAVENOUS
  Administered 2021-03-01: 50 mg via INTRAVENOUS

## 2021-03-01 MED ORDER — ROCURONIUM BROMIDE 10 MG/ML (PF) SYRINGE
PREFILLED_SYRINGE | INTRAVENOUS | Status: AC
  Filled 2021-03-01: qty 10

## 2021-03-01 MED ORDER — PROPOFOL 10 MG/ML IV BOLUS
INTRAVENOUS | Status: AC
  Filled 2021-03-01: qty 20

## 2021-03-01 MED ORDER — GLYCOPYRROLATE PF 0.2 MG/ML IJ SOSY
PREFILLED_SYRINGE | INTRAMUSCULAR | Status: DC | PRN
  Administered 2021-03-01: .2 mg via INTRAVENOUS

## 2021-03-01 MED ORDER — EPHEDRINE SULFATE-NACL 50-0.9 MG/10ML-% IV SOSY
PREFILLED_SYRINGE | INTRAVENOUS | Status: DC | PRN
  Administered 2021-03-01 (×2): 10 mg via INTRAVENOUS

## 2021-03-01 MED ORDER — PHENYLEPHRINE HCL (PRESSORS) 10 MG/ML IV SOLN
INTRAVENOUS | Status: DC | PRN
  Administered 2021-03-01 (×2): 80 ug via INTRAVENOUS
  Administered 2021-03-01: 120 ug via INTRAVENOUS

## 2021-03-01 SURGICAL SUPPLY — 42 items
BENZOIN TINCTURE PRP APPL 2/3 (GAUZE/BANDAGES/DRESSINGS) IMPLANT
BLADE CLIPPER SURG (BLADE) IMPLANT
CANNULA 10G ACCESS (KITS) ×4 IMPLANT
CANNULA BONE CEMENT NEU 11G (CANNULA) ×2 IMPLANT
CEMENT HV W/AUTOPLEX MIXER (Cement) ×2 IMPLANT
COVER WAND RF STERILE (DRAPES) ×2 IMPLANT
DECANTER SPIKE VIAL GLASS SM (MISCELLANEOUS) ×2 IMPLANT
DERMABOND ADVANCED (GAUZE/BANDAGES/DRESSINGS) ×1
DERMABOND ADVANCED .7 DNX12 (GAUZE/BANDAGES/DRESSINGS) ×1 IMPLANT
DRAPE C-ARM 42X72 X-RAY (DRAPES) ×2 IMPLANT
DRAPE INCISE IOBAN 66X45 STRL (DRAPES) ×2 IMPLANT
DRAPE LAPAROTOMY 100X72X124 (DRAPES) ×2 IMPLANT
DRAPE SURG 17X23 STRL (DRAPES) ×2 IMPLANT
DRAPE WARM FLUID 44X44 (DRAPES) IMPLANT
DRSG OPSITE POSTOP 3X4 (GAUZE/BANDAGES/DRESSINGS) IMPLANT
DURAPREP 26ML APPLICATOR (WOUND CARE) ×2 IMPLANT
GAUZE 4X4 16PLY RFD (DISPOSABLE) IMPLANT
GAUZE SPONGE 4X4 12PLY STRL (GAUZE/BANDAGES/DRESSINGS) ×2 IMPLANT
GLOVE BIOGEL PI IND STRL 7.5 (GLOVE) ×1 IMPLANT
GLOVE BIOGEL PI INDICATOR 7.5 (GLOVE) ×1
GLOVE ECLIPSE 7.5 STRL STRAW (GLOVE) ×2 IMPLANT
GOWN STRL REUS W/ TWL LRG LVL3 (GOWN DISPOSABLE) IMPLANT
GOWN STRL REUS W/ TWL XL LVL3 (GOWN DISPOSABLE) ×2 IMPLANT
GOWN STRL REUS W/TWL 2XL LVL3 (GOWN DISPOSABLE) IMPLANT
GOWN STRL REUS W/TWL LRG LVL3 (GOWN DISPOSABLE)
GOWN STRL REUS W/TWL XL LVL3 (GOWN DISPOSABLE) ×4
KIT BASIN OR (CUSTOM PROCEDURE TRAY) ×2 IMPLANT
KIT SPINEJACK PREP 4.2 SU (KITS) ×2 IMPLANT
KIT TURNOVER KIT B (KITS) ×2 IMPLANT
MARKER SKIN DUAL TIP RULER LAB (MISCELLANEOUS) ×2 IMPLANT
NEEDLE HYPO 22GX1.5 SAFETY (NEEDLE) ×2 IMPLANT
NS IRRIG 1000ML POUR BTL (IV SOLUTION) ×2 IMPLANT
PACK LAMINECTOMY NEURO (CUSTOM PROCEDURE TRAY) IMPLANT
PACK SURGICAL SETUP 50X90 (CUSTOM PROCEDURE TRAY) ×2 IMPLANT
PAD ARMBOARD 7.5X6 YLW CONV (MISCELLANEOUS) ×6 IMPLANT
PUSHER CEMENT SPINEJACK 4.2 SU (ORTHOPEDIC DISPOSABLE SUPPLIES) ×4 IMPLANT
STRIP CLOSURE SKIN 1/4X4 (GAUZE/BANDAGES/DRESSINGS) IMPLANT
SUT VICRYL RAPIDE 3 0 (SUTURE) ×2 IMPLANT
SUT VICRYL RAPIDE 4/0 PS 2 (SUTURE) IMPLANT
TOWEL GREEN STERILE (TOWEL DISPOSABLE) ×2 IMPLANT
TOWEL GREEN STERILE FF (TOWEL DISPOSABLE) ×2 IMPLANT
WATER STERILE IRR 1000ML POUR (IV SOLUTION) ×2 IMPLANT

## 2021-03-01 NOTE — Progress Notes (Signed)
Pt stated that there is little to no pain in back when sitting or ambulating, most pain is from irritation in throat. Pt ambulated from [place] 3 to restroom, voided and is getting dressed in Phase 2.

## 2021-03-01 NOTE — Discharge Instructions (Addendum)
Can shower Resume Xarelto on 4/24 Pain medication Vicodin sent to her pharmacy Walk as much as possible No heavy lifting >20 lbs No excessive bending/twisting at the waist

## 2021-03-01 NOTE — Op Note (Signed)
PREOP DIAGNOSIS: T6 compression fracture, T7 compression fracture   POSTOP DIAGNOSIS: T6 compression fracture, T7 compression fracture   PROCEDURE: 1. T6 vertebral body stenting, kyphoplasty and cement augmentation with [employer] SpineJack 2. T7 vertebral body augmentation with cement  SURGEON: Dr. Duffy Rhody, MD  ASSISTANT: None  ANESTHESIA: GETA  EBL: Minimal  SPECIMENS: None  IMPLANTS: [employer] SpineJack x 2, bone cement  DRAINS: None  COMPLICATIONS: None immediate  CONDITION: Hemodynamically stable to PCAU  HISTORY: [name] is a 75 y.o. yo female who suffered a T6 compression fracture in February 2022 that appeared mild.  She had worsening of her pain despite nonsurgical treatments and subsequent MRI showed significant worsening of her fracture, with greater than 75% loss of height and focal kyphosis.  There was also slight retropulsion of bone though she had no neurologic deficits.  There is also interval development of a new compression fracture of the inferior endplate of T7.  Given her persistent pain symptoms and the radiographic worsening, I discussed with them the option of vertebral augmentation for purposes of pain control as well as to help prevent further worsening.  Risk, benefits, alternatives, and expected convalescence were discussed with the patient and her husband.  Risk discussed included, but were not limited to, bleeding, pain, infection, scar, progression of fracture, fractures of adjacent vertebrae, cement extravasation, neurologic deficit, embolization, and death.  They wish to proceed with surgery.  Informed consent was obtained   PROCEDURE IN DETAIL: After informed consent was obtained and witnessed, the patient was brought to the operating room. After induction of anesthesia, the patient was positioned on the operative table in the prone position. All pressure points were meticulously padded. Fluoroscopy was used to mark out the projection of  the T6 and T7 pedicles on the skin. Skin incision was then marked out and prepped and draped in the usual sterile fashion.  After time out was conducted, bilateral stab incisions were made and Jamshidi needles were introduced. Under AP and lateral fluoroscopic guidance, an extrapedicular approach to the collapsed T6 body was undertaken given her small pedicles.   Drill was then used to create a channel for the spine jack implant, and the space was prepared with a tamp, with cannula remaining in the body.  This was done first on the right side and then the left side.  The small spinejack was then introduced into the cannulas bilaterally and then under AP and lateral x-ray control, was expanded.  Given the subacute nature of the fracture, there was some slight initial resistance with expansion but good height restoration was obtained.  Three quarters of a stick of PMMA cement was then introduced into the SpineJacks with good cross-filling and no extravasation seen.  The cannulas were then removed.  Jamshidi's were then placed under AP and lateral fluoroscopic guidance into the T7 pedicles and into the body.  The pedicles were slightly larger here and the Jamshidi needle small enough that we could do a pedicular approach at T7.  With the Jamshidi cannula in the mid body, cement was then gently introduced, with 1 stick on each side.  Good flow in the body was seen with no evidence of extravasation.  The Jamshidi cannulas were then removed.  The wounds were irrigated thoroughly and Marcaine was introduced in the paraspinous muscles.  Skin was closed with 3-0 Vicryl Rapide followed by Dermabond.  Patient was then flipped supine and extubated by the anesthesia service.  She was following commands in all 4 extremities.  All counts were correct at the end of surgery.  No complications are noted.

## 2021-03-01 NOTE — Anesthesia Procedure Notes (Signed)
Procedure Name: Intubation Performed by: Lowella Dell, CRNA Pre-anesthesia Checklist: Patient identified, Emergency Drugs available, Suction available and Patient being monitored Patient Re-evaluated:Patient Re-evaluated prior to induction Oxygen Delivery Method: Circle system utilized Preoxygenation: Pre-oxygenation with 100% oxygen Induction Type: IV induction Ventilation: Mask ventilation without difficulty and Oral airway inserted - appropriate to patient size Laryngoscope Size: Glidescope and 3 Grade View: Grade I Tube type: Oral Tube size: 7.0 mm Number of attempts: 3 Airway Equipment and Method: Stylet and Oral airway Placement Confirmation: ETT inserted through vocal cords under direct vision,  positive ETCO2 and breath sounds checked- equal and bilateral Secured at: 21 cm Tube secured with: Tape Dental Injury: Teeth and Oropharynx as per pre-operative assessment  Comments: First 2 attempts with MAC 3 blade. Anterior airway. Grade II view but could not advance ETT. Glidescope grade 1 view

## 2021-03-01 NOTE — H&P (Addendum)
CC: back pain  HPI:     Patient is a 75 y.o. female presented with progressive back pain and was found to have a T6 and T7 compression fracture, acute to subacute.  She is here for elective kyphoplasty    Patient Active Problem List   Diagnosis Date Noted  . Cognitive deficit, post-stroke 07/13/2020  . Cerebral venous sinus thrombosis 03/30/2020  . Presence of IVC filter   . Benign essential HTN   . Peripheral edema   . Impulsive   . Hypoalbuminemia due to protein-calorie malnutrition ([place])   . Anemia   . Prediabetes   . Steroid-induced hyperglycemia   . Embolic stroke ([place]) 03/47/4259  . Dysphagia   . Vascular headache   . Essential thrombocytosis ([place])   . Venous thrombosis   . Polycythemia   . Encounter for central line care   . Nontraumatic subcortical hemorrhage of right cerebral hemisphere ([place])   . Abnormal CT of the chest   . Atrial flutter with rapid ventricular response ([place])   . Pulmonary embolus ([place]) 02/22/2020  . Polycythemia vera ([place]) 02/17/2020  . Likely Cerebral amyloid angiopathy ([place]) 02/17/2020  . IVH (intraventricular hemorrhage) ([place]) 02/14/2020  . TIA (transient ischemic attack) 08/17/2014  . Acute sinusitis 08/16/2014  . Headache 08/15/2014  . Hypothyroidism 08/15/2014  . Hepatitis C   . Hyperlipidemia   . Hypertension    Past Medical History:  Diagnosis Date  . Arthritis   . COPD (chronic obstructive pulmonary disease) ([place])   . Hepatitis C   . Hyperlipidemia   . Hypertension   . Osteoporosis   . Stroke ([place])   . Thrombocytosis     Past Surgical History:  Procedure Laterality Date  . CHOLECYSTECTOMY    . IR ANGIO INTRA EXTRACRAN SEL INTERNAL CAROTID BILAT MOD SED  02/24/2020  . IR ANGIO VERTEBRAL SEL VERTEBRAL UNI R MOD SED  02/24/2020  . IR ANGIOGRAM SELECTIVE EACH ADDITIONAL VESSEL  02/24/2020  . IR IVC FILTER PLMT / S&I /IMG GUID/MOD SED  02/22/2020  . IR IVC FILTER RETRIEVAL / S&I /IMG GUID/MOD SED  06/27/2020  . IR RADIOLOGIST  EVAL & MGMT  05/23/2020  . IR THROMBECT VENO MECH MOD SED  02/24/2020  . IR US GUIDE VASC ACCESS RIGHT  02/24/2020  . IR US GUIDE VASC ACCESS RIGHT  02/24/2020  . IR VENO SAGITTAL SINUS  02/24/2020  . IR VENO/JUGULAR RIGHT  02/24/2020  . RADIOLOGY WITH ANESTHESIA N/A 02/24/2020   Procedure: IR WITH ANESTHESIA;  Surgeon: Radiologist, Medication, MD;  Location: [facility];  Service: Radiology;  Laterality: N/A;  . TONSILLECTOMY      Medications Prior to Admission  Medication Sig Dispense Refill Last Dose  . acetaminophen (TYLENOL) 500 MG tablet Take 1,000 mg by mouth every 6 (six) hours as needed for moderate pain or headache.   Past Week at Unknown time  . amLODipine (NORVASC) 10 MG tablet Take 10 mg by mouth daily.  30 tablet 0 03/01/2021 at 0445  . Apoaequorin (PREVAGEN) 10 MG CAPS Take 1 capsule by mouth every morning. (Patient taking differently: Take 10 mg by mouth every morning.) 1 capsule 1 03/01/2021 at 0445  . atorvastatin (LIPITOR) 10 MG tablet Take 1 tablet (10 mg total) by mouth daily. (Patient taking differently: Take 10 mg by mouth every evening.) 30 tablet 0 03/01/2021 at 0445  . Cyanocobalamin (B-12) 5000 MCG CAPS Take 5,000 mcg by mouth daily.   Past Week at Unknown time  . docusate sodium (  COLACE) 100 MG capsule Take 100 mg by mouth daily.   02/28/2021 at Unknown time  . hydroxyurea (HYDREA) 500 MG capsule Take 1 capsule (500 mg total) by mouth daily. May take with food to minimize GI side effects. 120 capsule 6 03/01/2021 at 0445  . levothyroxine (SYNTHROID) 100 MCG tablet Take 100 mcg by mouth daily before breakfast.   03/01/2021 at 0445  . losartan (COZAAR) 50 MG tablet Take 1 tablet (50 mg total) by mouth 2 (two) times daily. 60 tablet 0 02/28/2021 at Unknown time  . pantoprazole (PROTONIX) 20 MG tablet Take 20 mg by mouth daily.   03/01/2021 at 0445  . rivaroxaban (XARELTO) 20 MG TABS tablet Take 1 tablet (20 mg total) by mouth daily with breakfast. 30 tablet 0 02/26/2021  .  HYDROcodone-acetaminophen (NORCO/VICODIN) 5-325 MG tablet Take 1 tablet by mouth every 6 (six) hours as needed for severe pain. (Patient not taking: No sig reported) 12 tablet 0 Not Taking at Unknown time   Allergies  Allergen Reactions  . Heparin Other (See Comments)    4/15 HIT OD 2.553, SRA negative > treated as true HIT  . Erythromycin Diarrhea, Itching and Other (See Comments)    Severe stomach pains    Social History   Tobacco Use  . Smoking status: Former Smoker    Quit date: 2003    Years since quitting: 19.3  . Smokeless tobacco: Never Used  Substance Use Topics  . Alcohol use: Yes    Alcohol/week: 1.0 standard drink    Types: 1 Glasses of wine per week    Comment: nightly    Family History  Problem Relation Age of Onset  . Heart disease Mother   . Heart disease Father      Review of Systems Pertinent items noted in HPI and remainder of comprehensive ROS otherwise negative.  Objective:   Patient Vitals for the past 8 hrs:  BP Temp Temp src Pulse Resp SpO2 Height Weight  03/01/21 0552 (!) 143/68 98.2 F (36.8 C) Oral 82 18 100 % 5\' 2"  (1.575 m) 65.3 kg   I/O last 3 completed shifts: In: 200 [IV Piggyback:200] Out: -  No intake/output data recorded.      General : Alert, cooperative, no distress, appears stated age   Head:  Normocephalic/atraumatic    Eyes: PERRL, conjunctiva/corneas clear, EOM's intact. Fundi could not be visualized Neck: Supple Chest:  Respirations unlabored Chest wall: no tenderness or deformity Heart: Regular rate and rhythm Abdomen: Soft, nontender and nondistended Extremities: warm and well-perfused Skin: normal turgor, color and texture Neurologic:  Alert, oriented x 3.  Eyes open spontaneously. PERRL, EOMI, VFC, no facial droop. V1-3 intact.  No dysarthria, tongue protrusion symmetric.  CNII-XII intact. Normal strength, sensation and reflexes throughout.  No pronator drift, full strength in legs  Some midline tenderness midback.      Data Review  T6 compression fracture with kyphosis, 75% loss of height.  Significantlyl worsened from initial CT from Feb 2021.   Posterior cortex appears intact.  Mild T7 inferior endplate compression fracture.  Both have associated STIR signal.  Assessment:   Severe T6 compression fracture with kyphosis, 75% height loss, mild stenosis that has progressed, and associated mild T7 compression fracture which are associated with persistent mechanical back pain  Plan:   T6 and T7 vertebral augmentation, possible SpineJack at T6 if anatomy allows to help restore greater sagittal balance.

## 2021-03-01 NOTE — Transfer of Care (Signed)
Immediate Anesthesia Transfer of Care Note  Patient: [name]  Procedure(s) Performed: Kyphoplasty Thoracic six, Thoracic seven with spinejack (N/A )  Patient Location: PACU  Anesthesia Type:General  Level of Consciousness: awake and patient cooperative  Airway & Oxygen Therapy: Patient Spontanous Breathing and Patient connected to nasal cannula oxygen  Post-op Assessment: Report given to RN, Post -op Vital signs reviewed and stable and Patient moving all extremities  Post vital signs: Reviewed and stable  Last Vitals:  Vitals Value Taken Time  BP    Temp    Pulse    Resp    SpO2      Last Pain:  Vitals:   03/01/21 0625  TempSrc:   PainSc: 2       Patients Stated Pain Goal: 2 (81/85/90 9311)  Complications: No complications documented.

## 2021-03-01 NOTE — Anesthesia Postprocedure Evaluation (Signed)
Anesthesia Post Note  Patient: [name]  Procedure(s) Performed: Kyphoplasty Thoracic six, Thoracic seven with spinejack (N/A )     Patient location during evaluation: PACU Anesthesia Type: General Level of consciousness: awake and alert Pain management: pain level controlled Vital Signs Assessment: post-procedure vital signs reviewed and stable Respiratory status: spontaneous breathing, nonlabored ventilation and respiratory function stable Cardiovascular status: blood pressure returned to baseline and stable Postop Assessment: no apparent nausea or vomiting Anesthetic complications: no   No complications documented.  Last Vitals:  Vitals:   03/01/21 1050 03/01/21 1105  BP: 104/63 104/60  Pulse: 85 90  Resp: 13 18  Temp:  36.7 C  SpO2: 99% 95%    Last Pain:  Vitals:   03/01/21 1105  TempSrc:   PainSc: [name]

## 2021-03-02 ENCOUNTER — Encounter (HOSPITAL_COMMUNITY): Payer: Self-pay | Admitting: Neurosurgery

## 2021-03-14 ENCOUNTER — Ambulatory Visit: Payer: Medicare HMO | Admitting: Neurology

## 2021-03-14 ENCOUNTER — Encounter: Payer: Self-pay | Admitting: Neurology

## 2021-03-14 VITALS — BP 122/69 | HR 82 | Ht 62.0 in | Wt 144.8 lb

## 2021-03-14 DIAGNOSIS — Z8673 Personal history of transient ischemic attack (TIA), and cerebral infarction without residual deficits: Secondary | ICD-10-CM | POA: Diagnosis not present

## 2021-03-14 DIAGNOSIS — G3184 Mild cognitive impairment, so stated: Secondary | ICD-10-CM

## 2021-03-14 DIAGNOSIS — Z86718 Personal history of other venous thrombosis and embolism: Secondary | ICD-10-CM

## 2021-03-14 DIAGNOSIS — G08 Intracranial and intraspinal phlebitis and thrombophlebitis: Secondary | ICD-10-CM | POA: Diagnosis not present

## 2021-03-14 MED ORDER — ASPIRIN EC 81 MG PO TBEC: 81.0000 mg | DELAYED_RELEASE_TABLET | Freq: Every day | ORAL | 11 refills | Status: AC

## 2021-03-14 NOTE — Patient Instructions (Signed)
I had a long discussion with the patient and her husband regarding her history of remote cerebral venous sinus thrombosis and she is done extremely well and its been more than a year that she has been on anticoagulation with Xarelto and I recommend we discontinue it now and switch to aspirin 81 mg daily.  Check follow-up MR venogram of the brain and MRI of the brain.  She also has mild cognitive impairment but is otherwise doing extremely well.  I recommend she increase participation in mentally challenging activities like solving crossword puzzles, playing bridge and sodoku.  Continue taking Prevagen 10 mg daily.  We also discussed memory compensation strategies.  She will return for follow-up with me in the future in a year or call earlier if necessary. Memory Compensation Strategies  1. Use "WARM" strategy.  W= write it down  A= associate it  R= repeat it  M= make a mental note  2.   You can keep a Social worker.  Use a 3-ring notebook with sections for the following: calendar, important names and phone numbers,  medications, doctors' names/phone numbers, lists/reminders, and a section to journal what you did  each day.   3.    Use a calendar to write appointments down.  4.    Write yourself a schedule for the day.  This can be placed on the calendar or in a separate section of the Memory Notebook.  Keeping a  regular schedule can help memory.  5.    Use medication organizer with sections for each day or morning/evening pills.  You may need help loading it  6.    Keep a basket, or pegboard by the door.  Place items that you need to take out with you in the basket or on the pegboard.  You may also want to  include a message board for reminders.  7.    Use sticky notes.  Place sticky notes with reminders in a place where the task is performed.  For example: " turn off the  stove" placed by the stove, "lock the door" placed on the door at eye level, " take your medications" on  the bathroom  mirror or by the place where you normally take your medications.  8.    Use alarms/timers.  Use while cooking to remind yourself to check on food or as a reminder to take your medicine, or as a  reminder to make a call, or as a reminder to perform another task, etc.

## 2021-03-14 NOTE — Progress Notes (Signed)
[facility] [address] [place]. [place] [address] [phone]       OFFICE FOLLOW-UP NOTE  [name] Date of Birth:  1946-08-18 Medical Record Number:  [id number]   HPI: [name] is a 75 year old pleasant Caucasian lady seen today for initial office follow-up visit following hospital admission for intracerebral hemorrhage in April 2021.  She is accompanied by her husband.  History is obtained from them and review of electronic medical records and I personally reviewed imaging films in PACS. She is a 75 year old lady with past medical history of hypertension who presented to [facility] on 02/13/2020 with sudden onset of severe headache.  She was sitting in front of the computer and noticed that headache on the top of her head which is quite severe and persisted without relief.  She came to the emergency room where routine blood work revealed evidence of thrombocytosis and CT scan showed intraventricular hemorrhage.  This involves mostly the right lateral ventricle and there was changes of chronic small vessel disease.  She was kept in the intensive care unit where blood pressure was tightly controlled and follow-up CT scan showed stable appearance of the hemorrhage.  MRI scan was obtained which also showed stable intraventricular hemorrhage and severe changes of small vessel disease and multiple foci of microhemorrhages raising possibility of amyloid angiopathy versus hypertensive microangiopathy.  MRA of the brain showed no large vessel stenosis or occlusion.  2D echo showed normal ejection fraction.  LDL cholesterol 92 mg percent.  Hemoglobin A1c was 6.1.  Initially the etiology of the hemorrhage was thought to be hypertensive but subsequently patient's showed neurological worsening and she developed new left temporal occipital hemorrhage.  MR venogram on 02/25/2020 raise possibility of deep cerebral venous thrombosis.  Patient became obtunded and less  responsive.  She started hypertonic saline note back to the ICU however she did not get better.  Subsequently a cerebral catheter angiogram was done 03/01/2020 by Dr. Manson Passey and after consent she did mechanical thrombectomy with recanalization of the vein of Galen and straight sinus with decreased clot burden in the torcula right transverse and sigmoid sinuses.  A residual nonocclusive clot was seen in the vein of Galen.  Patient had presented with thrombocytosis and she was diagnosed with polycythemia and followed by the hematology team and she underwent a pheresis.  She is started on hydroxyurea subsequently she developed thrombocytopenia and lab work suggested heparin-induced thrombocytopenia.  She was initially started on IV heparin for her venous sinus thrombosis but this was subsequently discontinued and she was started on IV argatroban which was continued till she was transferred to rehab and was able to swallow then she was switched to Pradaxa.  Patient spent 3 weeks in rehab and is gradually improved.  His speech and language are improved.  She is living at home with his husband.  She still has some word finding difficulties and occasional paraphasic errors but she is still getting ongoing speech therapy which is helpful.  She has no physical weakness and gait or balance problems.  Her platelet count is been followed regularly by Dr. Jana Hakim hematologist and she has recently been started back on hydroxyurea and her diagnosis is no change to essential thrombocytosis rather than polycythemia vera.  Patient's blood pressure has been running quite low with systolic in the 21H and 08M recently.  She is on Lasix, hydralazine, Norvasc.  She is having cognitive and memory issues but otherwise is doing fine. Update 08/30/2020 :  She returns for follow-up after last visit 4 months ago.  She is accompanied by her husband.  She states she is doing well.  Her speech has improved and speech therapy plans to  discharge her next week.  She is taking much better.  Her short-term memory is improving but is still not back to normal.  She has good days and bad days.  She has difficulty remembering names.  She is mostly independent in most activities for daily living with her husband does not live alone particularly when she is cooking.  Short-term memory remains poor.  She is tolerating Xarelto well without bleeding or bruising.  Blood pressures well controlled and she has discontinued Lasix and hydralazine.  Blood pressure today is 129/70.  She has no new complaints.  Her IVC filter was removed by Dr. Earleen Newport on 06/27/2020. Update 03/14/2021 : She returns for follow-up after last visit 6 months ago.  She is accompanied by her husband.  Patient continues to do well and has not had any recurrent stroke or TIA symptoms.  She remains on Xarelto which is tolerating well without bleeding or bruising.  However she fell at home on 12/15/2020 and suffered 2 compression fractures.  She underwent kyphoplasty and T7 by Dr. Duffy Rhody.  She continues to have mild short-term memory difficulties which appear to be unchanged though she did better on objective testing in the office today.  She has easy distractibility and trouble with multitasking.  She continues to follow-up with Dr. Jana Hakim for a polycythemia which appears to be quite well controlled on hydroxyurea and last hematocrit on 02/21/2021 was 34.9.  She has no new complaints today. ROS:   14 system review of systems is positive for compression fracture, back pain memory loss, speech difficulties, headache, confusion and all other systems negative  PMH:  Past Medical History:  Diagnosis Date  . Arthritis   . COPD (chronic obstructive pulmonary disease) ([place])   . Hepatitis C   . Hyperlipidemia   . Hypertension   . Osteoporosis   . Stroke ([place])   . Thrombocytosis     Social History:  Social History   Socioeconomic History  . Marital status: Married    Spouse  name: [name]  . Number of children: Not on file  . Years of education: Not on file  . Highest education level: Not on file  Occupational History  . Occupation: Retired  Tobacco Use  . Smoking status: Former Smoker    Quit date: 2003    Years since quitting: 19.3  . Smokeless tobacco: Never Used  Vaping Use  . Vaping Use: Never used  Substance and Sexual Activity  . Alcohol use: Yes    Alcohol/week: 1.0 standard drink    Types: 1 Glasses of wine per week    Comment: nightly  . Drug use: No  . Sexual activity: Not on file  Other Topics Concern  . Not on file  Social History Narrative   Lives with spouse   Right Handed   Drinks 2-3 cups of caffeine daily   Social Determinants of Health   Financial Resource Strain: Not on file  Food Insecurity: Not on file  Transportation Needs: Not on file  Physical Activity: Not on file  Stress: Not on file  Social Connections: Not on file  Intimate Partner Violence: Not on file    Medications:   Current Outpatient Medications on File Prior to Visit  Medication Sig Dispense Refill  . acetaminophen (TYLENOL) 500 MG  tablet Take 1,000 mg by mouth every 6 (six) hours as needed for moderate pain or headache.    Marland Kitchen amLODipine (NORVASC) 10 MG tablet Take 10 mg by mouth daily.  30 tablet 0  . Apoaequorin (PREVAGEN) 10 MG CAPS Take 1 capsule by mouth every morning. (Patient taking differently: Take 10 mg by mouth every morning.) 1 capsule 1  . atorvastatin (LIPITOR) 10 MG tablet Take 1 tablet (10 mg total) by mouth daily. (Patient taking differently: Take 10 mg by mouth every evening.) 30 tablet 0  . Cyanocobalamin (B-12) 5000 MCG CAPS Take 5,000 mcg by mouth daily.    Marland Kitchen docusate sodium (COLACE) 100 MG capsule Take 100 mg by mouth daily.    Marland Kitchen HYDROcodone-acetaminophen (NORCO/VICODIN) 5-325 MG tablet Take 1 tablet by mouth every 6 (six) hours as needed for severe pain. 12 tablet 0  . hydroxyurea (HYDREA) 500 MG capsule Take 1 capsule (500 mg total)  by mouth daily. May take with food to minimize GI side effects. 120 capsule 6  . levothyroxine (SYNTHROID) 100 MCG tablet Take 100 mcg by mouth daily before breakfast.    . losartan (COZAAR) 50 MG tablet Take 1 tablet (50 mg total) by mouth 2 (two) times daily. 60 tablet 0  . pantoprazole (PROTONIX) 20 MG tablet Take 20 mg by mouth daily.     No current facility-administered medications on file prior to visit.    Allergies:   Allergies  Allergen Reactions  . Heparin Other (See Comments)    4/15 HIT OD 2.553, SRA negative > treated as true HIT  . Erythromycin Diarrhea, Itching and Other (See Comments)    Severe stomach pains    Physical Exam General: well developed, well nourished,\elderly Caucasian lady seated, in no evident distress Head: head normocephalic and atraumatic.  Neck: supple with no carotid or supraclavicular bruits Cardiovascular: regular rate and rhythm, no murmurs Musculoskeletal: no deformity Skin:  no rash/petichiae Vascular:  Normal pulses all extremities Vitals:   03/14/21 1434  BP: 122/69  Pulse: 82   Neurologic Exam Mental Status: Awake and fully alert. Oriented to place and time. Recent and remote memory poor. Attention span, concentration and fund of knowledge diminished. Mood and affect appropriate.  Diminished recall 2/3.  Able to name 11 animals that can walk on 4 legs.  Clock drawing 3/4.  Speech seems mostly fluent though slightly hesitant but no paraphasic errors.  Good naming repetition comprehension. Cranial Nerves: Fundoscopic exam not done. Pupils equal, briskly reactive to light. Extraocular movements full without nystagmus. Visual fields full to confrontation. Hearing intact. Facial sensation intact. Face, tongue, palate moves normally and symmetrically.  Motor: Normal bulk and tone. Normal strength in all tested extremity muscles. Sensory.: intact to touch ,pinprick .position and vibratory sensation.  Coordination: Rapid alternating movements  normal in all extremities. Finger-to-nose and heel-to-shin performed accurately bilaterally. Gait and Station: Arises from chair without difficulty. Stance is normal. Gait demonstrates normal stride length and balance . Able to heel, toe and tandem walk with mild difficulty.  Reflexes: 1+ and symmetric. Toes downgoing.    ASSESSMENT: 75 year old Caucasian lady with intraventricular hemorrhage due to deep cerebral venous sinus thrombosis likely related to polycythemia treated with mechanical thrombectomy and prolonged hospital course complicated by bilateral pulmonary embolism, heparin-induced thrombocytopenia and anemia.  Patient is doing relatively well now and her mild aphasia seems to have improved and cognitive impairment still persist.     PLAN: I had a long discussion with the patient and her husband regarding  her history of remote cerebral venous sinus thrombosis and she is done extremely well and its been more than a year that she has been on anticoagulation with Xarelto and I recommend we discontinue it now and switch to aspirin 81 mg daily.  Check follow-up MR venogram of the brain and MRI of the brain.  She also has mild cognitive impairment but is otherwise doing extremely well.  I recommend she increase participation in mentally challenging activities like solving crossword puzzles, playing bridge and sodoku.  Continue taking Prevagen 10 mg daily.  We also discussed memory compensation strategies.  She will return for follow-up with me in the future in a year or call earlier if necessary. Greater than 50% of time during this 25 minute visit was spent on counseling,explanation of diagnosis of , planning of further management of cerebral venous sinus thrombosis and mild cognitive impairment, discussion with patient and family and coordination of care Antony Contras, MD  [facility] [address] [place], [place] [address]  Phone [phone] Fax  [phone] Note: This document was prepared with digital dictation and possible smart phrase technology. Any transcriptional errors that result from this process are unintentional

## 2021-03-15 ENCOUNTER — Telehealth: Payer: Self-pay | Admitting: Neurology

## 2021-03-15 NOTE — Telephone Encounter (Signed)
[employer] medic[employer] order sent to [facility]. They will obtain the auth and reach out to the patient to schedule.  °

## 2021-03-16 ENCOUNTER — Ambulatory Visit: Payer: Medicare HMO | Admitting: Cardiovascular Disease

## 2021-03-16 DIAGNOSIS — Z6826 Body mass index (BMI) 26.0-26.9, adult: Secondary | ICD-10-CM | POA: Diagnosis not present

## 2021-03-16 DIAGNOSIS — S22050K Wedge compression fracture of T5-T6 vertebra, subsequent encounter for fracture with nonunion: Secondary | ICD-10-CM | POA: Diagnosis not present

## 2021-03-29 DIAGNOSIS — E782 Mixed hyperlipidemia: Secondary | ICD-10-CM | POA: Diagnosis not present

## 2021-03-29 DIAGNOSIS — M810 Age-related osteoporosis without current pathological fracture: Secondary | ICD-10-CM | POA: Diagnosis not present

## 2021-03-29 DIAGNOSIS — K219 Gastro-esophageal reflux disease without esophagitis: Secondary | ICD-10-CM | POA: Diagnosis not present

## 2021-03-29 DIAGNOSIS — J449 Chronic obstructive pulmonary disease, unspecified: Secondary | ICD-10-CM | POA: Diagnosis not present

## 2021-03-29 DIAGNOSIS — I10 Essential (primary) hypertension: Secondary | ICD-10-CM | POA: Diagnosis not present

## 2021-03-29 DIAGNOSIS — E039 Hypothyroidism, unspecified: Secondary | ICD-10-CM | POA: Diagnosis not present

## 2021-03-29 DIAGNOSIS — D649 Anemia, unspecified: Secondary | ICD-10-CM | POA: Diagnosis not present

## 2021-03-29 DIAGNOSIS — I639 Cerebral infarction, unspecified: Secondary | ICD-10-CM | POA: Diagnosis not present

## 2021-03-31 DIAGNOSIS — Z23 Encounter for immunization: Secondary | ICD-10-CM | POA: Diagnosis not present

## 2021-04-20 DIAGNOSIS — S22050K Wedge compression fracture of T5-T6 vertebra, subsequent encounter for fracture with nonunion: Secondary | ICD-10-CM | POA: Diagnosis not present

## 2021-05-03 DIAGNOSIS — R059 Cough, unspecified: Secondary | ICD-10-CM | POA: Diagnosis not present

## 2021-05-03 DIAGNOSIS — Z03818 Encounter for observation for suspected exposure to other biological agents ruled out: Secondary | ICD-10-CM | POA: Diagnosis not present

## 2021-05-03 DIAGNOSIS — J069 Acute upper respiratory infection, unspecified: Secondary | ICD-10-CM | POA: Diagnosis not present

## 2021-05-16 ENCOUNTER — Other Ambulatory Visit: Payer: Self-pay

## 2021-05-16 ENCOUNTER — Inpatient Hospital Stay: Payer: Medicare HMO | Attending: Oncology

## 2021-05-16 ENCOUNTER — Other Ambulatory Visit: Payer: Self-pay | Admitting: *Deleted

## 2021-05-16 DIAGNOSIS — D473 Essential (hemorrhagic) thrombocythemia: Secondary | ICD-10-CM

## 2021-05-16 DIAGNOSIS — D45 Polycythemia vera: Secondary | ICD-10-CM | POA: Insufficient documentation

## 2021-05-16 LAB — CBC WITH DIFFERENTIAL (CANCER CENTER ONLY)
Abs Immature Granulocytes: 0.01 10*3/uL (ref 0.00–0.07)
Basophils Absolute: 0 10*3/uL (ref 0.0–0.1)
Basophils Relative: 0 %
Eosinophils Absolute: 0 10*3/uL (ref 0.0–0.5)
Eosinophils Relative: 1 %
HCT: 36 % (ref 36.0–46.0)
Hemoglobin: 11.9 g/dL — ABNORMAL LOW (ref 12.0–15.0)
Immature Granulocytes: 0 %
Lymphocytes Relative: 31 %
Lymphs Abs: 1.5 10*3/uL (ref 0.7–4.0)
MCH: 37.5 pg — ABNORMAL HIGH (ref 26.0–34.0)
MCHC: 33.1 g/dL (ref 30.0–36.0)
MCV: 113.6 fL — ABNORMAL HIGH (ref 80.0–100.0)
Monocytes Absolute: 0.4 10*3/uL (ref 0.1–1.0)
Monocytes Relative: 8 %
Neutro Abs: 2.9 10*3/uL (ref 1.7–7.7)
Neutrophils Relative %: 60 %
Platelet Count: 226 10*3/uL (ref 150–400)
RBC: 3.17 MIL/uL — ABNORMAL LOW (ref 3.87–5.11)
RDW: 12 % (ref 11.5–15.5)
WBC Count: 4.9 10*3/uL (ref 4.0–10.5)
nRBC: 0 % (ref 0.0–0.2)

## 2021-05-16 LAB — CMP (CANCER CENTER ONLY)
ALT: 18 U/L (ref 0–44)
AST: 21 U/L (ref 15–41)
Albumin: 4.3 g/dL (ref 3.5–5.0)
Alkaline Phosphatase: 91 U/L (ref 38–126)
Anion gap: 9 (ref 5–15)
BUN: 18 mg/dL (ref 8–23)
CO2: 25 mmol/L (ref 22–32)
Calcium: 10.3 mg/dL (ref 8.9–10.3)
Chloride: 110 mmol/L (ref 98–111)
Creatinine: 0.93 mg/dL (ref 0.44–1.00)
GFR, Estimated: >60 mL/min (ref >60)
Glucose, Bld: 109 mg/dL — ABNORMAL HIGH (ref 70–99)
Potassium: 3.9 mmol/L (ref 3.5–5.1)
Sodium: 144 mmol/L (ref 135–145)
Total Bilirubin: 0.9 mg/dL (ref 0.3–1.2)
Total Protein: 7.3 g/dL (ref 6.5–8.1)

## 2021-05-18 DIAGNOSIS — M810 Age-related osteoporosis without current pathological fracture: Secondary | ICD-10-CM | POA: Diagnosis not present

## 2021-05-18 DIAGNOSIS — E039 Hypothyroidism, unspecified: Secondary | ICD-10-CM | POA: Diagnosis not present

## 2021-05-18 DIAGNOSIS — K219 Gastro-esophageal reflux disease without esophagitis: Secondary | ICD-10-CM | POA: Diagnosis not present

## 2021-05-18 DIAGNOSIS — I639 Cerebral infarction, unspecified: Secondary | ICD-10-CM | POA: Diagnosis not present

## 2021-05-18 DIAGNOSIS — D649 Anemia, unspecified: Secondary | ICD-10-CM | POA: Diagnosis not present

## 2021-05-18 DIAGNOSIS — I610 Nontraumatic intracerebral hemorrhage in hemisphere, subcortical: Secondary | ICD-10-CM | POA: Diagnosis not present

## 2021-05-18 DIAGNOSIS — J449 Chronic obstructive pulmonary disease, unspecified: Secondary | ICD-10-CM | POA: Diagnosis not present

## 2021-05-18 DIAGNOSIS — M1812 Unilateral primary osteoarthritis of first carpometacarpal joint, left hand: Secondary | ICD-10-CM | POA: Diagnosis not present

## 2021-05-18 DIAGNOSIS — E782 Mixed hyperlipidemia: Secondary | ICD-10-CM | POA: Diagnosis not present

## 2021-05-18 DIAGNOSIS — I10 Essential (primary) hypertension: Secondary | ICD-10-CM | POA: Diagnosis not present

## 2021-05-23 IMAGING — CT CT T SPINE W/O CM
3 of 5 series · 9 of 33 positions shown, 10 images · non-contrast
Comparison: MRI 01/24/2021

CLINICAL DATA: Closed wedge compression fracture of T6 with
nonunion, preoperative evaluation for kyphoplasty

EXAM:
CT THORACIC SPINE WITHOUT CONTRAST
TECHNIQUE: Multidetector CT images of the thoracic were obtained using the
standard protocol without intravenous contrast.

[Series 3: t-spine 2.00 br40 s3 · axial · 0.29mm/px · z∈[-836,-836]mm · 1 of 154 slices shown, 2 images]
[im 77/154  soft-tissue]
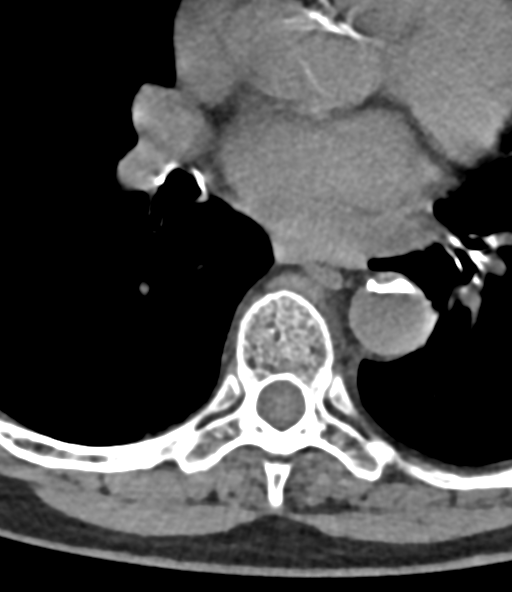
[im 77/154  bone]
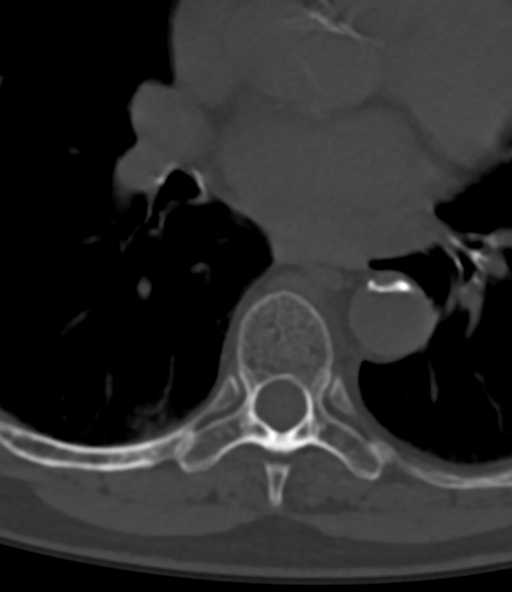

[Series 7: t-spine 2.00 br60 s3 · sagittal · 0.33mm/px · 5 of 73 slices shown (1 of 2)]
[im 25/73  bone]
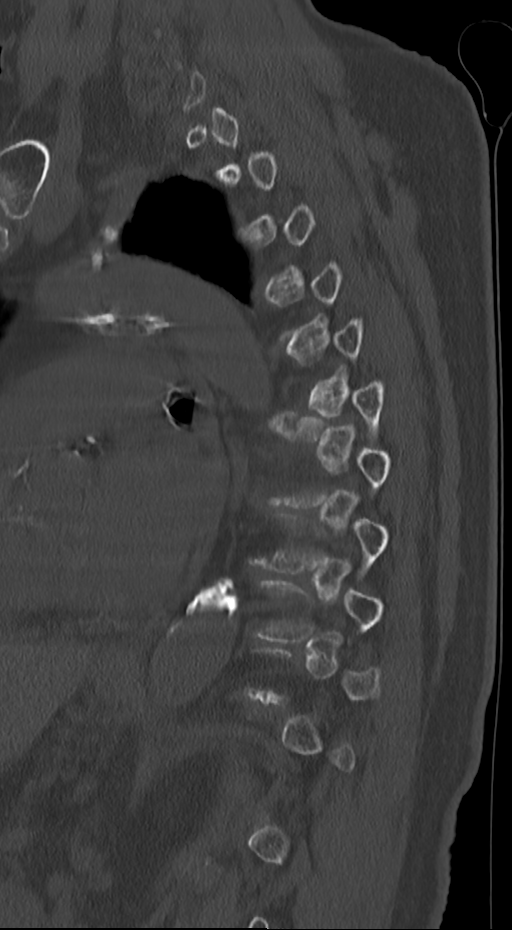
[im 31/73  bone]
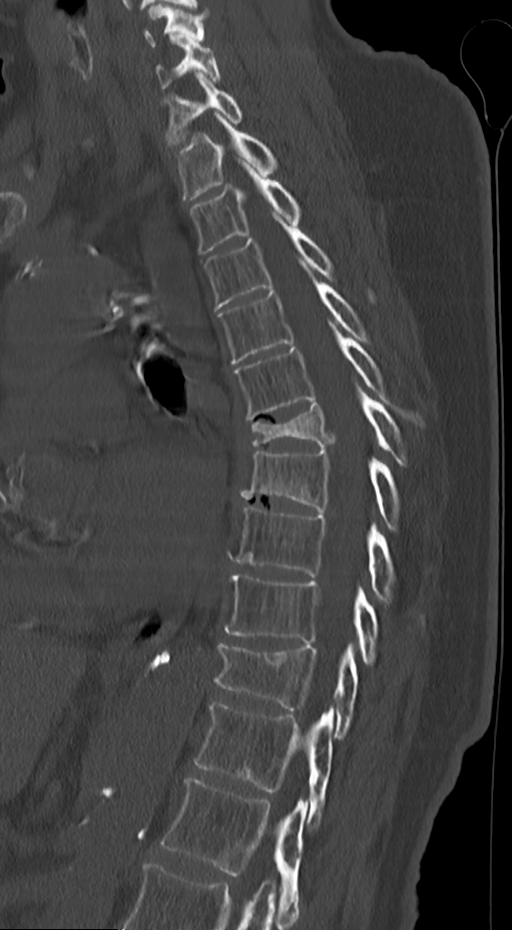
[im 37/73  bone]
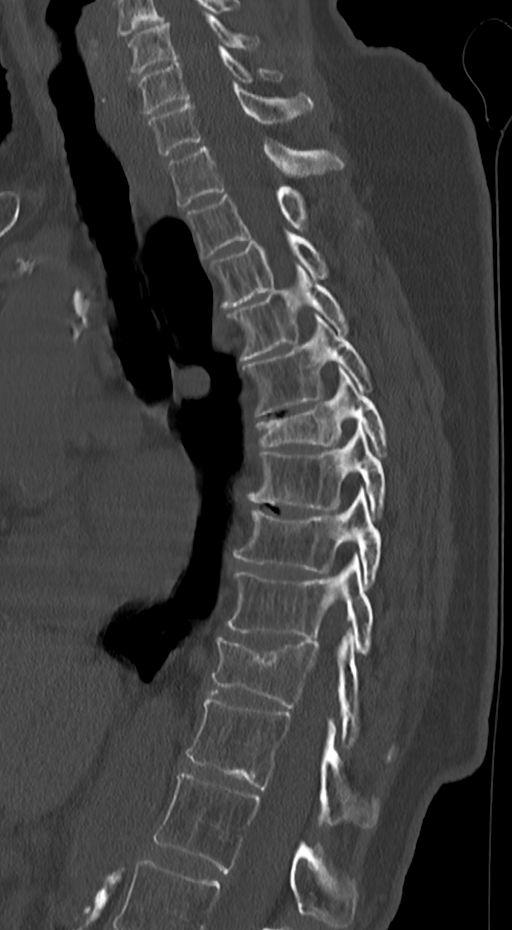
[im 43/73  bone]
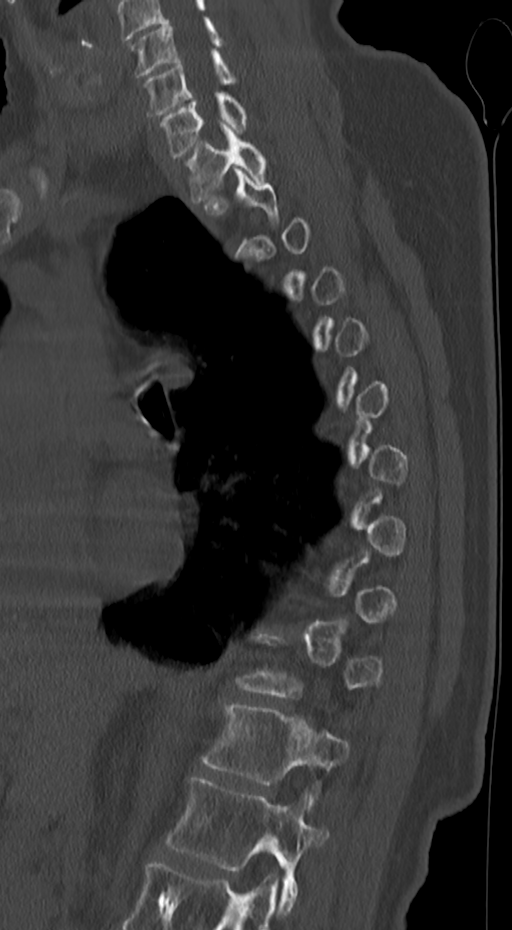
[im 49/73  bone]
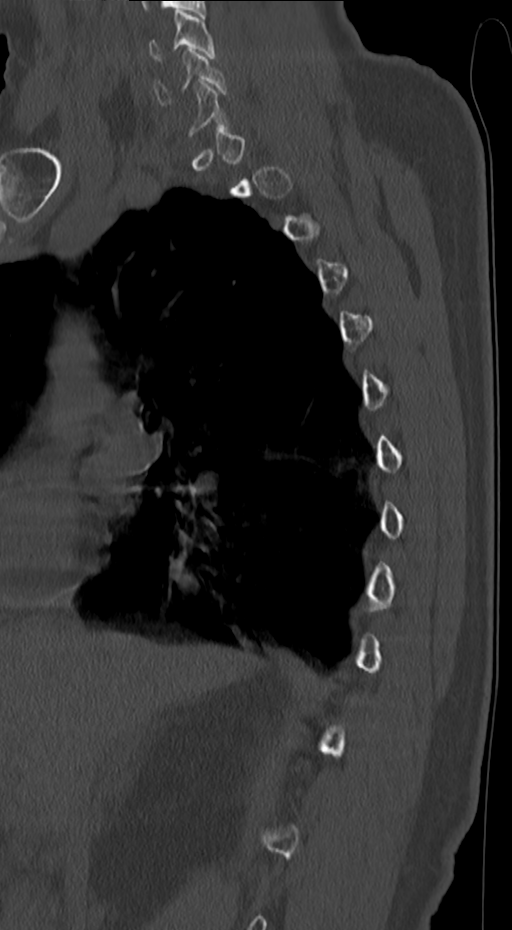

[Series 9: t-spine 2.00 br60 s3 · coronal · 0.29mm/px · 3 of 84 slices shown (2 of 2)]
[im 17/84  bone]
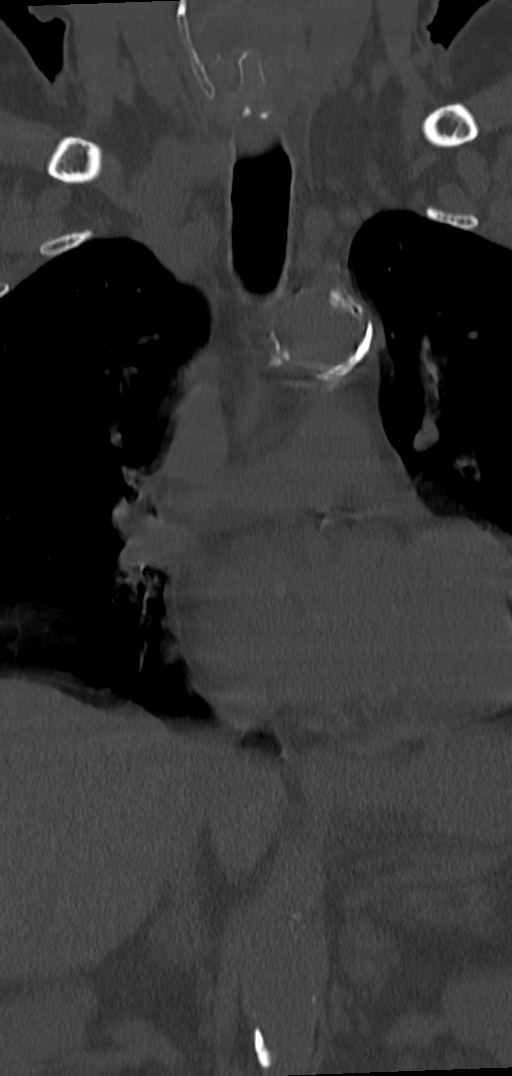
[im 34/84  bone]
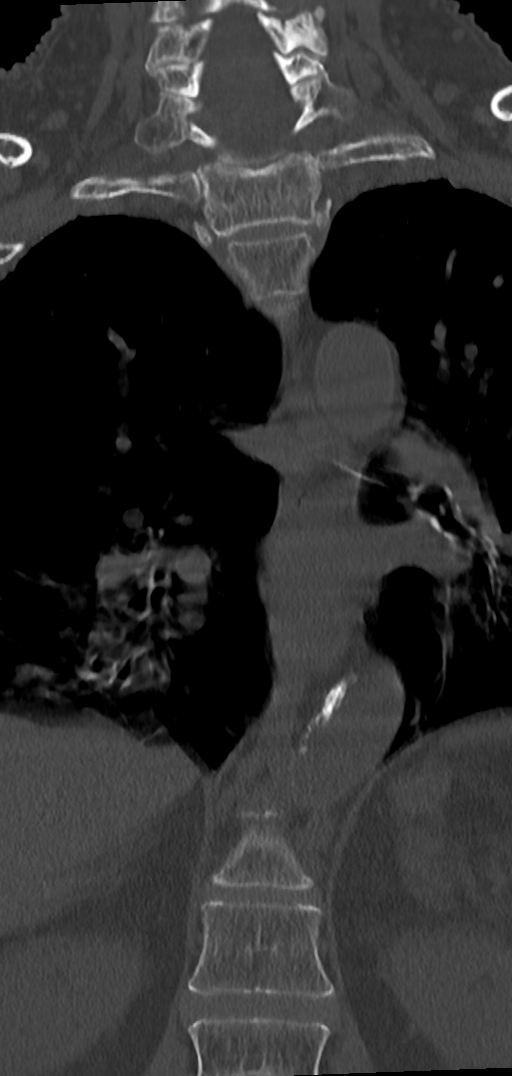
[im 50/84  bone]
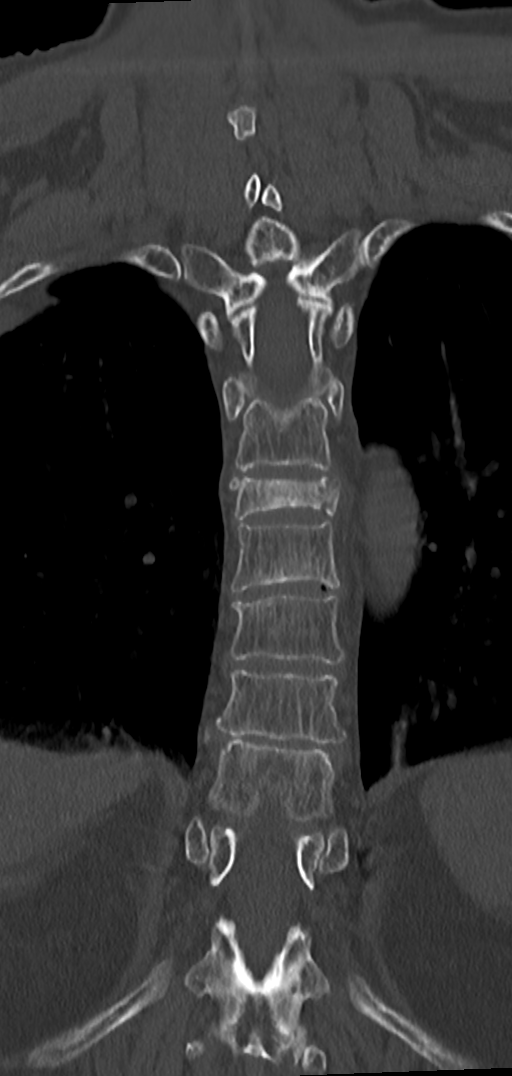

[9 of 33 positions shown; findings below may reference images not displayed]

FINDINGS: Alignment: Exaggeration of the normal thoracic kyphosis. Mild
levocurvature of the spine, apex T7. No significant listhesis.
Posterior elements remain normally aligned. No abnormally widened,
jumped or perched facets.

Vertebrae: Diffuse bony demineralization.

Subacute appearing burst fracture deformity with anterior wedging at
the T6 level with up to 75% height loss anteriorly and 4 mm mm of
retropulsion of the inferior endplate into the canal.

Subacute inferior endplate wedging compression deformity T7 with 25%
height loss.

Subacute superior endplate wedging compression deformity of T8 with
15% height loss.

Chronic anterior wedging deformity at T10 with approximately 50%
height loss anteriorly

Chronic anterior wedging at T11 with 20% height loss anteriorly.

Multilevel Schmorl's node formations are seen. No conspicuous lytic
or blastic lesions.

Paraspinal and other soft tissues: Very mild soft tissue swelling
remains about the T6 level. No fluid collection, gas or other sites
of significant thickening or swelling. Included portions of the
chest and abdomen reveal aortic and coronary atherosclerosis as well
as extensive centrilobular and paraseptal emphysematous changes in
the lungs with some bandlike areas of bibasilar scarring or
atelectasis. Mild symmetric bilateral perinephric stranding, a
nonspecific finding which may correlate with advanced age or
decreased renal function. Prior cholecystectomy.

Disc levels: Diffuse intervertebral disc height loss with some mild
multilevel discogenic changes. Some minimal facet arthropathy T1-T2
resulting in mild bilateral foraminal narrowing. Retropulsion of the
T6 inferior endplate resulting in at most mild canal stenosis and
only minimal effacement of the neural foramina. Tiny left
paracentral disc protrusion seen T7-T8, T8-T9 without significant
foraminal or canal impingement. No other significant canal or
foraminal stenosis within the imaged levels.
IMPRESSION: 1. Subacute appearing burst fracture deformity with anterior wedging
at the T6 level with up to 75% height loss anteriorly and 4 mm of
retropulsion of the inferior endplate into the canal. Retropulsion
of the inferior endplate resulting in at most mild canal stenosis
and only minimal effacement of the neural foramina.
2. Subacute wedging compression deformity of T7 inferior endplate
with 25% height loss and the T8 superior endplate with 15% height
loss.
3. Chronic anterior wedging deformity at T10, T11.
4. Diffuse bony demineralization.
5. Discogenic and facet degenerative changes as detailed above.
Stenoses most pronounced at T6-T7 or retropulsion of the inferior
endplate results in mild canal and foraminal narrowing.
6. Aortic Atherosclerosis ([id number]).

## 2021-05-29 IMAGING — RF DG C-ARM 1-60 MIN
1 series · 1 of 1 positions shown · non-contrast
Comparison: February 23, 2021

CLINICAL DATA: T6, T7 kyphoplasty.

EXAM:
DG C-ARM 1-60 MIN; THORACIC SPINE 2 VIEWS
FLUOROSCOPY TIME:  Fluoroscopy Time:  4 minutes and 43 seconds.
Radiation Exposure Index (if provided by the fluoroscopic device):
162.9 mGy.
Number of Acquired Spot Images: 3

[Series 1: run · 1 of 1 slices shown]
[im 1/1]
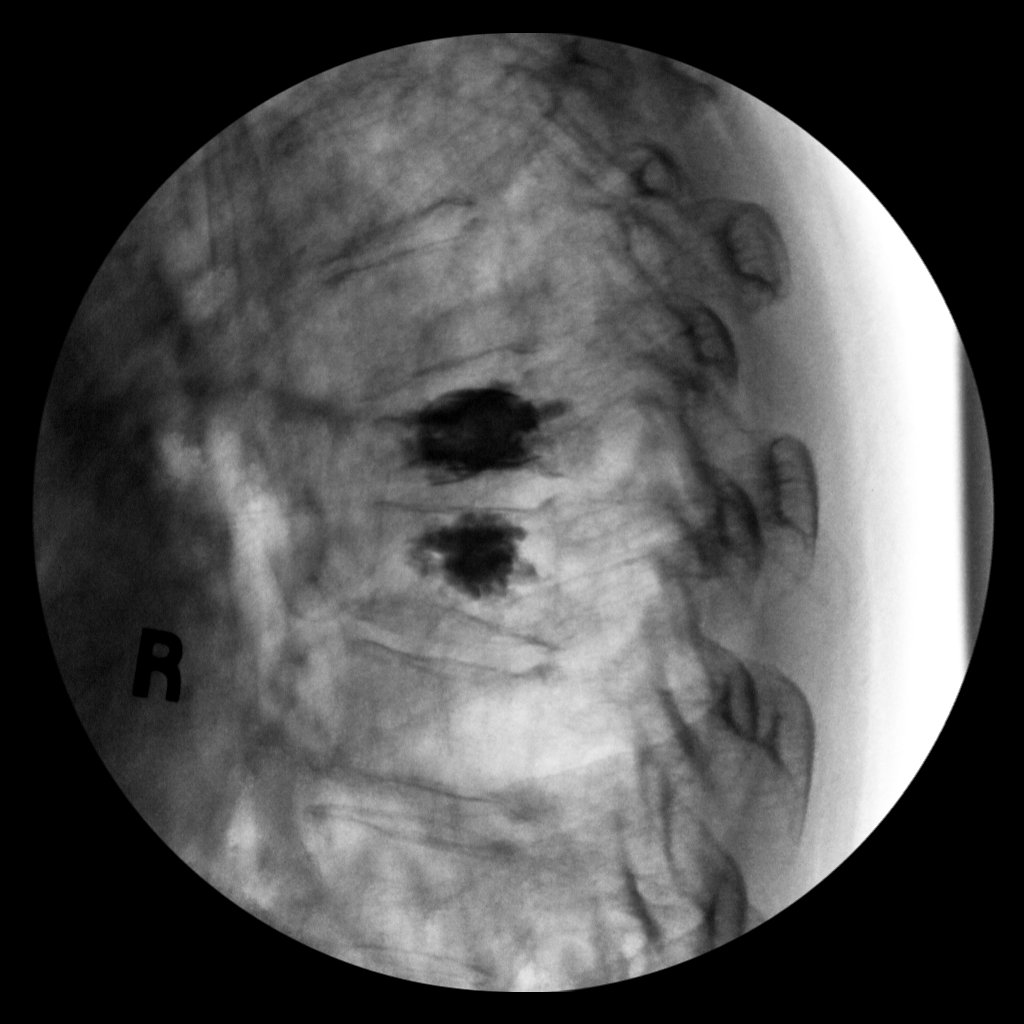

[1 of 1 positions shown; findings below may reference images not displayed]

FINDINGS: Three C-arm fluoroscopic images were obtained intraoperatively and
submitted for post operative interpretation. These images
demonstrate changes of vertebral augmentation with cement at T6 and
T7 without clear extravasation. Please see the performing provider's
procedural report for further detail.
IMPRESSION: T6 and T7 vertebral augmentation with cement.

## 2021-07-04 DIAGNOSIS — H25013 Cortical age-related cataract, bilateral: Secondary | ICD-10-CM | POA: Diagnosis not present

## 2021-07-04 DIAGNOSIS — H2513 Age-related nuclear cataract, bilateral: Secondary | ICD-10-CM | POA: Diagnosis not present

## 2021-07-04 DIAGNOSIS — H5203 Hypermetropia, bilateral: Secondary | ICD-10-CM | POA: Diagnosis not present

## 2021-07-09 DIAGNOSIS — D649 Anemia, unspecified: Secondary | ICD-10-CM | POA: Diagnosis not present

## 2021-07-09 DIAGNOSIS — M1812 Unilateral primary osteoarthritis of first carpometacarpal joint, left hand: Secondary | ICD-10-CM | POA: Diagnosis not present

## 2021-07-09 DIAGNOSIS — I639 Cerebral infarction, unspecified: Secondary | ICD-10-CM | POA: Diagnosis not present

## 2021-07-09 DIAGNOSIS — J449 Chronic obstructive pulmonary disease, unspecified: Secondary | ICD-10-CM | POA: Diagnosis not present

## 2021-07-09 DIAGNOSIS — E782 Mixed hyperlipidemia: Secondary | ICD-10-CM | POA: Diagnosis not present

## 2021-07-09 DIAGNOSIS — M810 Age-related osteoporosis without current pathological fracture: Secondary | ICD-10-CM | POA: Diagnosis not present

## 2021-07-09 DIAGNOSIS — I610 Nontraumatic intracerebral hemorrhage in hemisphere, subcortical: Secondary | ICD-10-CM | POA: Diagnosis not present

## 2021-07-09 DIAGNOSIS — I10 Essential (primary) hypertension: Secondary | ICD-10-CM | POA: Diagnosis not present

## 2021-07-09 DIAGNOSIS — K219 Gastro-esophageal reflux disease without esophagitis: Secondary | ICD-10-CM | POA: Diagnosis not present

## 2021-07-09 DIAGNOSIS — E039 Hypothyroidism, unspecified: Secondary | ICD-10-CM | POA: Diagnosis not present

## 2021-08-07 ENCOUNTER — Other Ambulatory Visit: Payer: Self-pay | Admitting: *Deleted

## 2021-08-07 DIAGNOSIS — D45 Polycythemia vera: Secondary | ICD-10-CM

## 2021-08-08 ENCOUNTER — Inpatient Hospital Stay: Payer: Medicare HMO | Attending: Oncology | Admitting: Oncology

## 2021-08-08 ENCOUNTER — Inpatient Hospital Stay: Payer: Medicare HMO

## 2021-08-08 ENCOUNTER — Other Ambulatory Visit: Payer: Self-pay

## 2021-08-08 VITALS — BP 132/68 | HR 74 | Temp 98.0°F | Resp 18 | Ht 62.0 in | Wt 147.1 lb

## 2021-08-08 DIAGNOSIS — I10 Essential (primary) hypertension: Secondary | ICD-10-CM | POA: Diagnosis not present

## 2021-08-08 DIAGNOSIS — D45 Polycythemia vera: Secondary | ICD-10-CM | POA: Diagnosis not present

## 2021-08-08 DIAGNOSIS — Z7982 Long term (current) use of aspirin: Secondary | ICD-10-CM | POA: Insufficient documentation

## 2021-08-08 DIAGNOSIS — D649 Anemia, unspecified: Secondary | ICD-10-CM | POA: Insufficient documentation

## 2021-08-08 DIAGNOSIS — D473 Essential (hemorrhagic) thrombocythemia: Secondary | ICD-10-CM | POA: Diagnosis not present

## 2021-08-08 DIAGNOSIS — Z79899 Other long term (current) drug therapy: Secondary | ICD-10-CM | POA: Insufficient documentation

## 2021-08-08 DIAGNOSIS — Z87891 Personal history of nicotine dependence: Secondary | ICD-10-CM | POA: Diagnosis not present

## 2021-08-08 DIAGNOSIS — D689 Coagulation defect, unspecified: Secondary | ICD-10-CM | POA: Insufficient documentation

## 2021-08-08 LAB — CBC WITH DIFFERENTIAL (CANCER CENTER ONLY)
Abs Immature Granulocytes: 0.01 10*3/uL (ref 0.00–0.07)
Basophils Absolute: 0 10*3/uL (ref 0.0–0.1)
Basophils Relative: 0 %
Eosinophils Absolute: 0.1 10*3/uL (ref 0.0–0.5)
Eosinophils Relative: 1 %
HCT: 35.5 % — ABNORMAL LOW (ref 36.0–46.0)
Hemoglobin: 12 g/dL (ref 12.0–15.0)
Immature Granulocytes: 0 %
Lymphocytes Relative: 26 %
Lymphs Abs: 1.3 10*3/uL (ref 0.7–4.0)
MCH: 36.8 pg — ABNORMAL HIGH (ref 26.0–34.0)
MCHC: 33.8 g/dL (ref 30.0–36.0)
MCV: 108.9 fL — ABNORMAL HIGH (ref 80.0–100.0)
Monocytes Absolute: 0.4 10*3/uL (ref 0.1–1.0)
Monocytes Relative: 9 %
Neutro Abs: 3.1 10*3/uL (ref 1.7–7.7)
Neutrophils Relative %: 64 %
Platelet Count: 211 10*3/uL (ref 150–400)
RBC: 3.26 MIL/uL — ABNORMAL LOW (ref 3.87–5.11)
RDW: 12.7 % (ref 11.5–15.5)
WBC Count: 4.9 10*3/uL (ref 4.0–10.5)
nRBC: 0 % (ref 0.0–0.2)

## 2021-08-08 LAB — CMP (CANCER CENTER ONLY)
ALT: 21 U/L (ref 0–44)
AST: 22 U/L (ref 15–41)
Albumin: 4.3 g/dL (ref 3.5–5.0)
Alkaline Phosphatase: 86 U/L (ref 38–126)
Anion gap: 12 (ref 5–15)
BUN: 16 mg/dL (ref 8–23)
CO2: 23 mmol/L (ref 22–32)
Calcium: 10.1 mg/dL (ref 8.9–10.3)
Chloride: 108 mmol/L (ref 98–111)
Creatinine: 0.93 mg/dL (ref 0.44–1.00)
GFR, Estimated: >60 mL/min (ref >60)
Glucose, Bld: 112 mg/dL — ABNORMAL HIGH (ref 70–99)
Potassium: 3.8 mmol/L (ref 3.5–5.1)
Sodium: 143 mmol/L (ref 135–145)
Total Bilirubin: 0.8 mg/dL (ref 0.3–1.2)
Total Protein: 7.2 g/dL (ref 6.5–8.1)

## 2021-08-08 MED ORDER — HYDROXYUREA 500 MG PO CAPS: 500.0000 mg | ORAL_CAPSULE | Freq: Every day | ORAL | 6 refills | Status: DC

## 2021-08-08 NOTE — Progress Notes (Addendum)
[place]  [phone] Fax:([phone]) [phone]     ID: HARGUN SPURLING DOB: 10/14/1946  MR#: [id number]  BTD#:[id number]  Patient Care Team: Cari Caraway, MD as PCP - General (Family Medicine) Buford Dresser, MD as PCP - Cardiology (Cardiology) Arvella Nigh, MD as Consulting Physician (Obstetrics and Gynecology) Garvin Fila, MD as Consulting Physician (Neurology) Chauncey Cruel, MD OTHER MD:  CHIEF COMPLAINT: Polycythemia vera/essential thrombocytosis, HIT, coagulopathy, anemia  CURRENT TREATMENT: Hydroxyurea, ASA   INTERVAL HISTORY: Jakylah returns today for follow up of her JAK2 positive proliferative neoplasm, which we initially diagnosed as polycythemia vera but which is behaving more as essential thrombocytosis.  She is accompanied by her husband [name].    She continues on hydroxyurea at 500 mg a day and is tolerating that well.  Her labs have been very stable.  She obtains the drug at a very good price  Since her last visit, she had a fall in the laundry room (really more a trip) causing some compression fractures.  She underwent T6-T7 kyphoplasty on 03/01/2021 under Dr. Marcello Moores with good relief.   REVIEW OF SYSTEMS: Marirose is doing "great".  She has some word finding difficulties at times, but this is very stable.  It does not appear to be progressive.  She is active and her 61 year old grandson who is a PhD in physical therapy does married in neuro ICU in [place] and she is just delighted with the couple.  She is hoping for great-grandchildren at some point in the relatively near future.  She is tolerating the aspirin with no bleeding and only minimal bruising.  A detailed review of systems today was otherwise stable   COVID 19 VACCINATION STATUS: Pfizer x3   HISTORY OF CURRENT ILLNESS: From the original consult note:  AHJANAE CASSEL has a history of hypertension followed by Dr. Theadore Nan.  For the last few days she has had  headache and she took her blood pressure and it was as high as 168/102 which is unusual for her.  As this was not improving she presented to the emergency room last night 02/13/2020. Dr Roxanne Mins obtained a basic lab work and a head CT.  The head CT without contrast showed an acute intraventricular hemorrhage in the right lateral ventricle, with no parenchymal component.  The lab work showed a white cell count of 12.0, hemoglobin 17.7, and platelets 990,000.   We were consulted for further evaluation and treatment.  The patient's subsequent history is as detailed below.   PAST MEDICAL HISTORY: Past Medical History:  Diagnosis Date   Arthritis    COPD (chronic obstructive pulmonary disease) ([place])    Hepatitis C    Hyperlipidemia    Hypertension    Osteoporosis    Stroke ([place])    Thrombocytosis     PAST SURGICAL HISTORY: Past Surgical History:  Procedure Laterality Date   CHOLECYSTECTOMY     IR ANGIO INTRA EXTRACRAN SEL INTERNAL CAROTID BILAT MOD SED  02/24/2020   IR ANGIO VERTEBRAL SEL VERTEBRAL UNI R MOD SED  02/24/2020   IR ANGIOGRAM SELECTIVE EACH ADDITIONAL VESSEL  02/24/2020   IR IVC FILTER PLMT / S&I /IMG GUID/MOD SED  02/22/2020   IR IVC FILTER RETRIEVAL / S&I /IMG GUID/MOD SED  06/27/2020   IR RADIOLOGIST EVAL & MGMT  05/23/2020   IR THROMBECT VENO MECH MOD SED  02/24/2020   IR US GUIDE VASC ACCESS RIGHT  02/24/2020   IR US GUIDE VASC ACCESS RIGHT  02/24/2020  IR VENO SAGITTAL SINUS  02/24/2020   IR VENO/JUGULAR RIGHT  02/24/2020   KYPHOPLASTY N/A 03/01/2021   Procedure: Kyphoplasty Thoracic six, Thoracic seven with spinejack;  Surgeon: Vallarie Mare, MD;  Location: [facility];  Service: Neurosurgery;  Laterality: N/A;   RADIOLOGY WITH ANESTHESIA N/A 02/24/2020   Procedure: IR WITH ANESTHESIA;  Surgeon: Radiologist, Medication, MD;  Location: [facility];  Service: Radiology;  Laterality: N/A;   TONSILLECTOMY      FAMILY HISTORY: Family History  Problem Relation Age of Onset   Heart  disease Mother    Heart disease Father    The patient's father died at age 76 and the patient's mother at age 99 both from heart disease.  The patient has 1 sister, no brothers.  There is no family history of blood problems and no family history of cancer to the patient's knowledge   GYNECOLOGIC HISTORY:  No LMP recorded. Patient is postmenopausal. Menarche: 75 years old Age at first live birth: 75 years old [place] P 2 LMP 60 HRT yes, a few years  Hysterectomy?  Salpingo-oophorectomy?   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  Her husband [name] ran a business but is now retired.  Their son [name] is a Adult nurse.  Their son Aaron Edelman is an Pharmacologist.  The patient has 1 grandson and 4 step grandchildren.  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    ADVANCED DIRECTIVES: In the absence of any documentation to the contrary, the patient's spouse is their HCPOA.    HEALTH MAINTENANCE: Social History   Tobacco Use   Smoking status: Former    Types: Cigarettes    Quit date: 2003    Years since quitting: 19.7   Smokeless tobacco: Never  Vaping Use   Vaping Use: Never used  Substance Use Topics   Alcohol use: Yes    Alcohol/week: 1.0 standard drink    Types: 1 Glasses of wine per week    Comment: nightly   Drug use: No     Colonoscopy: [facility]  PAP: [place]  Bone density:    Allergies  Allergen Reactions   Heparin Other (See Comments)    4/15 HIT OD 2.553, SRA negative > treated as true HIT   Erythromycin Diarrhea, Itching and Other (See Comments)    Severe stomach pains    Current Outpatient Medications  Medication Sig Dispense Refill   acetaminophen (TYLENOL) 500 MG tablet Take 1,000 mg by mouth every 6 (six) hours as needed for moderate pain or headache.     amLODipine (NORVASC) 10 MG tablet Take 10 mg by mouth daily.  30 tablet 0   Apoaequorin (PREVAGEN) 10 MG CAPS Take 1 capsule by mouth every morning. (Patient taking differently:  Take 10 mg by mouth every morning.) 1 capsule 1   aspirin EC 81 MG tablet Take 1 tablet (81 mg total) by mouth daily. Swallow whole. 30 tablet 11   atorvastatin (LIPITOR) 10 MG tablet Take 1 tablet (10 mg total) by mouth daily. (Patient taking differently: Take 10 mg by mouth every evening.) 30 tablet 0   Cyanocobalamin (B-12) 5000 MCG CAPS Take 5,000 mcg by mouth daily.     docusate sodium (COLACE) 100 MG capsule Take 100 mg by mouth daily.     HYDROcodone-acetaminophen (NORCO/VICODIN) 5-325 MG tablet Take 1 tablet by mouth every 6 (six) hours as needed for severe pain. 12 tablet 0   hydroxyurea (HYDREA) 500 MG capsule Take 1 capsule (500  mg total) by mouth daily. May take with food to minimize GI side effects. 120 capsule 6   levothyroxine (SYNTHROID) 100 MCG tablet Take 100 mcg by mouth daily before breakfast.     losartan (COZAAR) 50 MG tablet Take 1 tablet (50 mg total) by mouth 2 (two) times daily. 60 tablet 0   pantoprazole (PROTONIX) 20 MG tablet Take 20 mg by mouth daily.     No current facility-administered medications for this visit.    OBJECTIVE: White woman who appears stated age 34:   08/08/21 1313  BP: 132/68  Pulse: 74  Resp: 18  Temp: 98 F (36.7 C)  SpO2: 97%      Body mass index is 26.9 kg/m.   Wt Readings from Last 3 Encounters:  08/08/21 147 lb 1.6 oz (66.7 kg)  03/14/21 144 lb 12.8 oz (65.7 kg)  03/01/21 143 lb 15.4 oz (65.3 kg)      ECOG FS:1 - Symptomatic but completely ambulatory  Sclerae unicteric, EOMs intact Wearing a mask No cervical or supraclavicular adenopathy Lungs no rales or rhonchi Heart regular rate and rhythm Abd soft, nontender, positive bowel sounds MSK no focal spinal tenderness, no upper extremity lymphedema Neuro: nonfocal, well oriented, appropriate affect Breasts: No masses palpated, no skin or nipple changes of concern.  Both axillae are benign.   LAB RESULTS:  CMP     Component Value Date/Time   NA 143 08/08/2021  1303   K 3.8 08/08/2021 1303   CL 108 08/08/2021 1303   CO2 23 08/08/2021 1303   GLUCOSE 112 (H) 08/08/2021 1303   BUN 16 08/08/2021 1303   CREATININE 0.93 08/08/2021 1303   CALCIUM 10.1 08/08/2021 1303   PROT 7.2 08/08/2021 1303   ALBUMIN 4.3 08/08/2021 1303   AST 22 08/08/2021 1303   ALT 21 08/08/2021 1303   ALKPHOS 86 08/08/2021 1303   BILITOT 0.8 08/08/2021 1303   GFRNONAA >60 08/08/2021 1303   GFRAA 59 (L) 08/09/2020 1059    No results found for: TOTALPROTELP, ALBUMINELP, A1GS, A2GS, BETS, BETA2SER, GAMS, MSPIKE, SPEI  Lab Results  Component Value Date   WBC 4.9 08/08/2021   NEUTROABS 3.1 08/08/2021   HGB 12.0 08/08/2021   HCT 35.5 (L) 08/08/2021   MCV 108.9 (H) 08/08/2021   PLT 211 08/08/2021    No results found for: LABCA2  No components found for: MHDQQI297  No results for input(s): INR in the last 168 hours.  No results found for: LABCA2  No results found for: LGX211  No results found for: HER740  No results found for: CXK481  No results found for: CA2729  No components found for: HGQUANT  No results found for: CEA1 / No results found for: CEA1   No results found for: AFPTUMOR  No results found for: CHROMOGRNA  No results found for: KPAFRELGTCHN, LAMBDASER, KAPLAMBRATIO (kappa/lambda light chains)  No results found for: HGBA, HGBA2QUANT, HGBFQUANT, HGBSQUAN (Hemoglobinopathy evaluation)   Lab Results  Component Value Date   LDH 410 (H) 03/10/2020    Lab Results  Component Value Date   IRON 110 02/21/2021   TIBC 350 02/21/2021   IRONPCTSAT 31 02/21/2021   (Iron and TIBC)  Lab Results  Component Value Date   FERRITIN 85 02/21/2021    Urinalysis    Component Value Date/Time   COLORURINE YELLOW 05/19/2020 1554   APPEARANCEUR CLEAR 05/19/2020 1554   LABSPEC 1.045 (H) 05/19/2020 1554   PHURINE 6.0 05/19/2020 1554   GLUCOSEU NEGATIVE 05/19/2020 1554  HGBUR NEGATIVE 05/19/2020 [place] 05/19/2020 1554    [place] 05/19/2020 1554   PROTEINUR NEGATIVE 05/19/2020 1554   UROBILINOGEN 0.2 08/15/2014 2129   NITRITE POSITIVE (A) 05/19/2020 1554   LEUKOCYTESUR NEGATIVE 05/19/2020 1554     STUDIES: No results found.   ELIGIBLE FOR AVAILABLE RESEARCH PROTOCOL: no  ASSESSMENT: 75 y.o. [place] woman presenting 02/13/2020 with headache, found to have biventricular bleeds in the setting of panmyelosis, subsequently with pulmonary emboli and sinus venous thromboses in the setting of heparin induced thrombocytopenia   (1) myeloproliferative neoplasm: essential thrombocytosis             (a) JAK2 mutation positive              (b) bone marrow biopsy 02/13/2020 shows no evidence of leukemia             (c)  leukapheresis x3 . Last leukapheresis Friday 02/18/2020                         (i) HD catheter removed after pheresis on  02/18/2020             (d) hydrea 500 mg daily to keep HCT <45 and platelets in normal range  (e) Hydrea dose increased to 1000 mg daily Apr 07, 2020--not tolerated  (f) Hydrea resumed at 500 mg daily June 2021              (2) heparin induced thrombocytopenia: positive HIT screen x2             (a) severe thrombocythemia noted 02/24/2020             (b) bilateral pulmonary emboli 02/22/2020                         (i) s/p IVC filter placement 02/22/2020                         (ii) atrial fibrillation with RVR             (c) venous sinus thromboses 02/24/2020                         (i) s/p mechanical thrombectomy             (d) argatroban started 02/25/2020             (e) platelet count responded to heparin withdrawal             (f) confirmatory serotonin release assay negative (see discussion below)                         (i) continue argatroban for anticoagulation, no heparin                         (ii) repeat HIT screen at [facility] lab again strongly positive             (g) platelets normalized as of 03/08/2020   (3) drug-induced  thrombocytopenia             (a) discontinued amiodarone, inessential meds                         (i) remains in sinus             (  b) resolved   (4) anemia: reticulocytes inappropriately normal; no folate, B-12 or iron deficiency; normal renal function; LDH slightly elevated but negative DAT and normal t bil (no evidence of hemolysis)--resolved   PLAN: Malyiah is now a year and a half from initial diagnosis of what turned out to be essential thrombocytosis.  She is very stable on a dose of 500 mg of Hydrea daily.  The plan is to continue this indefinitely.  At this point I am comfortable seeing her on a once a year basis.  I refilled her Hydrea and she knows to call us for any bleeding or bruising problems or for any unexplained cytopenias that may occur.  Total encounter time 25 minutes.Sarajane Jews C. Magrinat, MD 08/08/2021 1:46 PM Medical Oncology and Hematology [facility] [place], [place] [address] Tel. [phone]    Fax. [phone]   This document serves as a record of services personally performed by Lurline Del, MD. It was created on his behalf by Wilburn Mylar, a trained medical scribe. The creation of this record is based on the scribe's personal observations and the provider's statements to them.   I, Lurline Del MD, have reviewed the above documentation for accuracy and completeness, and I agree with the above.   *Total Encounter Time as defined by the Centers for Medicare and Medicaid Services includes, in addition to the face-to-face time of a patient visit (documented in the note above) non-face-to-face time: obtaining and reviewing outside history, ordering and reviewing medications, tests or procedures, care coordination (communications with other health care professionals or caregivers) and documentation in the medical record.

## 2021-08-16 DIAGNOSIS — M8000XA Age-related osteoporosis with current pathological fracture, unspecified site, initial encounter for fracture: Secondary | ICD-10-CM | POA: Diagnosis not present

## 2021-08-16 DIAGNOSIS — R7309 Other abnormal glucose: Secondary | ICD-10-CM | POA: Diagnosis not present

## 2021-08-16 DIAGNOSIS — M25561 Pain in right knee: Secondary | ICD-10-CM | POA: Diagnosis not present

## 2021-08-16 DIAGNOSIS — E782 Mixed hyperlipidemia: Secondary | ICD-10-CM | POA: Diagnosis not present

## 2021-08-16 DIAGNOSIS — M25562 Pain in left knee: Secondary | ICD-10-CM | POA: Diagnosis not present

## 2021-08-16 DIAGNOSIS — J449 Chronic obstructive pulmonary disease, unspecified: Secondary | ICD-10-CM | POA: Diagnosis not present

## 2021-08-16 DIAGNOSIS — R32 Unspecified urinary incontinence: Secondary | ICD-10-CM | POA: Diagnosis not present

## 2021-08-16 DIAGNOSIS — E039 Hypothyroidism, unspecified: Secondary | ICD-10-CM | POA: Diagnosis not present

## 2021-08-16 DIAGNOSIS — G8929 Other chronic pain: Secondary | ICD-10-CM | POA: Diagnosis not present

## 2021-08-16 DIAGNOSIS — I10 Essential (primary) hypertension: Secondary | ICD-10-CM | POA: Diagnosis not present

## 2021-08-16 DIAGNOSIS — Z23 Encounter for immunization: Secondary | ICD-10-CM | POA: Diagnosis not present

## 2021-09-13 ENCOUNTER — Other Ambulatory Visit: Payer: Self-pay

## 2021-09-13 ENCOUNTER — Ambulatory Visit: Payer: Medicare HMO | Attending: Family Medicine | Admitting: Physical Therapy

## 2021-09-13 ENCOUNTER — Encounter: Payer: Self-pay | Admitting: Physical Therapy

## 2021-09-13 DIAGNOSIS — M25562 Pain in left knee: Secondary | ICD-10-CM | POA: Diagnosis not present

## 2021-09-13 DIAGNOSIS — M25561 Pain in right knee: Secondary | ICD-10-CM | POA: Diagnosis not present

## 2021-09-13 DIAGNOSIS — G8929 Other chronic pain: Secondary | ICD-10-CM | POA: Diagnosis not present

## 2021-09-13 DIAGNOSIS — R262 Difficulty in walking, not elsewhere classified: Secondary | ICD-10-CM | POA: Insufficient documentation

## 2021-09-13 NOTE — Therapy (Signed)
[facility] [address]  [place], [place], [address] Phone: [phone]   Fax:  [phone]  Physical Therapy Evaluation  Patient Details  Name: DELISA Duke MRN: [id number] Date of Birth: [birth year] Referring Provider (PT): Dr. Cari Caraway, MD   Encounter Date: 09/13/2021   PT End of Session - 09/13/21 1430     Visit Number 1    Number of Visits 12    Date for PT Re-Evaluation 12/06/21    Authorization Type A[employer]na Medicare    Progress Note Due on Visit 10    PT Start Time 1020    PT Stop Time 1100    PT Time Calculation (min) 40 min    Activity Tolerance Patient tolerated treatment well    Behavior During Therapy WFL for tasks assessed/performed             Past Medical History:  Diagnosis Date   Arthritis    COPD (chronic obstructive pulmonary disease) ([place] [place])    Hepatitis C    Hyperlipidemia    Hypertension    Osteoporosis    Stroke ([facility])    Thrombocytosis     Past Surgical History:  Procedure Laterality Date   CHOLECYSTECTOMY     IR ANGIO INTRA EXTRACRAN SEL INTERNAL CAROTID BILAT MOD SED  02/24/2020   IR ANGIO VERTEBRAL SEL VERTEBRAL UNI R MOD SED  02/24/2020   IR ANGIOGRAM SELECTIVE EACH ADDITIONAL VESSEL  02/24/2020   IR IVC FILTER PLMT / S&I /IMG GUID/MOD SED  02/22/2020   IR IVC FILTER RETRIEVAL / S&I /IMG GUID/MOD SED  06/27/2020   IR RADIOLOGIST EVAL & MGMT  05/23/2020   IR THROMBECT VENO MECH MOD SED  02/24/2020   IR US GUIDE VASC ACCESS RIGHT  02/24/2020   IR US GUIDE VASC ACCESS RIGHT  02/24/2020   IR VENO SAGITTAL SINUS  02/24/2020   IR VENO/JUGULAR RIGHT  02/24/2020   KYPHOPLASTY N/A 03/01/2021   Procedure: Kyphoplasty Thoracic six, Thoracic seven with spinejack;  Surgeon: Vallarie Mare, MD;  Location: [facility];  Service: Neurosurgery;  Laterality: N/A;   RADIOLOGY WITH ANESTHESIA N/A 02/24/2020   Procedure: IR WITH ANESTHESIA;  Surgeon: Radiologist, Medication, MD;  Location: [facility];  Service: Radiology;  Laterality: N/A;   TONSILLECTOMY      There were no vitals filed for this visit.    Subjective Assessment - 09/13/21 1027     Subjective Patient reports knee pain she thinks started after a fall.  She has history of stroke last year and does have some memory and word finding difficulties.  She fractured vertebra in her back and had to have surgery.  The back is better but still has mild pain especially with walking, but heating pad helps.  Walks 1-2 miles/day.  She reports the knee pain is mostly when sitting for long periods, when she gets up it is very painful    Limitations Sitting;Standing;Other (comment)   transfers   How long can you sit comfortably? no limitations but get stiff in knees    How long can you stand comfortably? no knee pain with standing, back limits    How long can you walk comfortably? 1.5-2 miles/day    Patient Stated Goals improve pain in knees    Currently in Pain? Yes    Pain Score 0-No pain   severe 7-8 but quickly goes away   Pain Location Knee    Pain Orientation Right;Left  Pain Descriptors / Indicators Aching;Stabbing    Pain Type Acute pain    Pain Onset Other (comment)   ~ 75 months   Pain Frequency Intermittent    Aggravating Factors  sitting to long, getting up from low surface    Pain Relieving Factors walking around    Effect of Pain on Daily Activities just hurts    Multiple Pain Sites Yes    Pain Score 0   5-7/10 with prolonged walking   Pain Location Back    Pain Descriptors / Indicators Aching    Pain Onset Other (comment)    Pain Frequency Intermittent    Aggravating Factors  prolonged walking    Pain Relieving Factors sitting down, heat pack    Effect of Pain on Daily Activities none really                [facility] PT Assessment - 09/13/21 0001       Assessment   Medical Diagnosis M25.561 (ICD-10-CM) - Pain in right knee   bilateral knee pain and deconditioning   Referring Provider (PT) Dr. Cari Caraway, MD    Onset Date/Surgical Date --   started around 02/2020   Next MD Visit 03/14/2022    Prior Therapy none      Precautions   Precautions None      Restrictions   Weight Bearing Restrictions No      Balance Screen   Has the patient fallen in the past 6 months Yes    How many times? 1   injured, fractured vertebra in back and hurt knees   Has the patient had a decrease in activity level because of a fear of falling?  No    Is the patient reluctant to leave their home because of a fear of falling?  No      Home Environment   Living Environment Private residence    Living Arrangements Spouse/significant other    Type of [place] to enter    Entrance Stairs-Number of Steps 2    [place] Two level;Able to live on main level with bedroom/bathroom    Alternate Level Stairs-Number of Steps 12    Alternate Level Stairs-Rails Right;Left;Can reach both    [place] - single point;Walker - 2 wheels;Grab bars - tub/shower      Prior Function   Level of Independence Independent    Vocation Retired    Leisure walking      Cognition   Overall Cognitive Status History of cognitive impairments - at baseline      Observation/Other Assessments   Observations Patient enters independently with no AD and no signs of instability, in no apparent distress.  Posture is upright and good.  Reports some cognitive impairments - difficulty with memory and occasional word finding, occasionally flustered asking questions like when pain started.    Focus on Therapeutic Outcomes (FOTO)  84      Posture/Postural Control   Posture/Postural Control No significant limitations      ROM / Strength   AROM / PROM / Strength Strength      Strength   Overall Strength Within functional limits for tasks performed    Overall Strength Comments 5/5 bil LE strength, no pain      Palpation   Palpation comment reports pain with transitions under bil patella, no pain with  palpation today      Transfers   Five time sit to stand comments  16 seconds   no pain with sit to stands, first trial 20 seconds with small LOB but after instructed proper technique able to complete quickly and easily     Ambulation/Gait   Gait Comments no deviation, no device, no signs of unsteadiness      Standardized Balance Assessment   Standardized Balance Assessment Timed Up and Go Test    Balance Master Testing Other/comments   m-CTSIB - 30 sec all conditions     Timed Up and Go Test   TUG Normal TUG    Normal TUG (seconds) 16    TUG Comments no AD, no hands                        Objective measurements completed on examination: See above findings.       [facility] Adult PT Treatment/Exercise - 09/13/21 0001       Self-Care   Self-Care Other Self-Care Comments    Other Self-Care Comments  see patient education                     PT Education - 09/13/21 1430     Education Details education on findings, plan of care, recommendations.  Education on importance of posture with sitting, not sitting in prolonged position with legs folded under her, LAQ and ankle pumps before standing up if stiff, proper form for sit to stand - heels underneath, lean forward nose over toes.    Person(s) Educated Patient;Spouse    Methods Explanation;Demonstration    Comprehension Verbalized understanding              PT Short Term Goals - 09/13/21 1439       PT SHORT TERM GOAL #1   Title Patient will be assessed for balance impairments- DGI/Berg    Time 1    Period Weeks    Status New    Target Date 09/20/21               PT Long Term Goals - 09/13/21 1440       PT LONG TERM GOAL #1   Title Pt. will be indendent with HEP to improve functional outcomes.    Time 6    Period Weeks    Status New    Target Date 10/25/21      PT LONG TERM GOAL #2   Title Pt. will demonstrate decreased fall risk with goal TBD.    Time 6    Period Weeks     Status New    Target Date 10/25/21      PT LONG TERM GOAL #3   Title Pt. will report 75% improvement in incidence/severity of bil knee pain.    Time 6    Period Weeks    Status New    Target Date 10/25/21                    Plan - 09/13/21 1431     Clinical Impression Statement Mrs. Bryauna Byrum is a 75 year old female referred for bil knee pain and deconditioning.  Initially she entered by herself, but due to report of memory problems, invited husband back to discuss findings and recommendations.  She reports bil knee pain mostly when transitioning from sitting to standing after sitting with legs "twisted" up under her - today she was able to stand up from standard height chair without any knee pain.  She demonstrates good bil LE strength  and reports walking 1.5-2 miles/day.  Discussed importance of good posture with sitting, not bending legs up under her, and stretching just prior to standing, to decrease report of knee pain.  Her FOTO today for knee pain was 84, indicating minimal disability due to knee pain, and knee pain is not preventing any desired activity.   Today her 5x STS score of 20 sec (with LOB) and then 16 sec does suggest balance dysfunction, and she has had 2 falls over the last year, one of which resulted in vertebral fracture requiring kyphoplasty.  She would benefit from further assessment of her balance to prevent fall with injury.  She will return next week for balance testing, and in meantime will monitor for knee pain and see if improves without further skilled intervention.    Personal Factors and Comorbidities Age;Other;Comorbidity 3+    Comorbidities arthritis, COPD, HTN, Osteoporosis, history of TIA and CVA, cognitive impairments, plycthemia vera, thrombocytosis, Aflutter.    Examination-Activity Limitations Stand;Sit    Examination-Participation Restrictions --    Stability/Clinical Decision Making Evolving/Moderate complexity    Clinical Decision Making  Low    Rehab Potential Good    PT Frequency --   1-2x/week   PT Duration 6 weeks    PT Treatment/Interventions ADLs/Self Care Home Management;Moist Heat;Cryotherapy;Gait training;Stair training;Functional mobility training;Therapeutic activities;Therapeutic exercise;Balance training;Neuromuscular re-education;Patient/family education;Manual techniques;Joint Manipulations;Taping;Passive range of motion;Dry needling    PT Next Visit Plan assess balance - Berg and DGI.    Consulted and Agree with Plan of Care Patient             Patient will benefit from skilled therapeutic intervention in order to improve the following deficits and impairments:  Decreased activity tolerance, Pain, Decreased balance, Decreased safety awareness, Impaired flexibility, Increased muscle spasms  Visit Diagnosis: Chronic pain of left knee  Chronic pain of right knee  Difficulty in walking, not elsewhere classified     Problem List Patient Active Problem List   Diagnosis Date Noted   Cognitive deficit, post-stroke 07/13/2020   Cerebral venous sinus thrombosis 03/30/2020   Presence of IVC filter    Benign essential HTN    Peripheral edema    Impulsive    Hypoalbuminemia due to protein-calorie malnutrition ([facility])    Anemia    Prediabetes    Steroid-induced hyperglycemia    Embolic stroke ([facility]) 11/13/7251   Dysphagia    Vascular headache    Essential thrombocytosis ([facility])    Venous thrombosis    Polycythemia    Encounter for central line care    Nontraumatic subcortical hemorrhage of right cerebral hemisphere ([facility])    Abnormal CT of the chest    Atrial flutter with rapid ventricular response ([place])    Pulmonary embolus ([place]) 02/22/2020   Polycythemia vera ([facility]) 02/17/2020   Likely Cerebral amyloid angiopathy ([place]) 02/17/2020   IVH (intraventricular hemorrhage) ([facility]) 02/14/2020   TIA (transient ischemic attack) 08/17/2014   Acute sinusitis 08/16/2014   Headache 08/15/2014   Hypothyroidism  08/15/2014   Hepatitis C    Hyperlipidemia    Hypertension     Rennie Natter, PT, DPT 09/13/2021, 2:45 PM  [facility] [address]  [place], [place], [address] Phone: [phone]   Fax:  [phone]  Name: ARAH ARO MRN: [id number] Date of Birth: 1946/09/22

## 2021-09-18 ENCOUNTER — Ambulatory Visit: Payer: Medicare HMO | Admitting: Physical Therapy

## 2021-09-18 ENCOUNTER — Encounter: Payer: Self-pay | Admitting: Physical Therapy

## 2021-09-18 ENCOUNTER — Other Ambulatory Visit: Payer: Self-pay

## 2021-09-18 DIAGNOSIS — M25561 Pain in right knee: Secondary | ICD-10-CM | POA: Diagnosis not present

## 2021-09-18 DIAGNOSIS — G8929 Other chronic pain: Secondary | ICD-10-CM | POA: Diagnosis not present

## 2021-09-18 DIAGNOSIS — R262 Difficulty in walking, not elsewhere classified: Secondary | ICD-10-CM | POA: Diagnosis not present

## 2021-09-18 DIAGNOSIS — M25562 Pain in left knee: Secondary | ICD-10-CM | POA: Diagnosis not present

## 2021-09-18 NOTE — Patient Instructions (Signed)
Access Code: [facility] URL: [email or web address] Date: 09/18/2021 Prepared by: Glenetta Hew  Exercises Tandem Stance with Support - 1 x daily - 7 x weekly - 1 sets - 3 reps - 30 sec hold Tandem Walking with Counter Support - 1 x daily - 7 x weekly - 1 sets - 10 reps Backward Tandem Walking with Counter Support - 1 x daily - 7 x weekly - 1 sets - 10 reps Lunge with Counter Support - 1 x daily - 7 x weekly - 2 sets - 10 reps Mini Squat with Counter Support - 1 x daily - 7 x weekly - 2 sets - 10 reps

## 2021-09-18 NOTE — Therapy (Signed)
[facility] [address]  [place], [place], [address] Phone: [phone]   Fax:  [phone]  Physical Therapy Treatment  Patient Details  Name: [name] MRN: [id number] Date of Birth: 20-Apr-1946 Referring Provider (PT): Dr. Cari Caraway, MD   Encounter Date: 09/18/2021   PT End of Session - 09/18/21 1117     Visit Number 2    Number of Visits 6    Date for PT Re-Evaluation 10/26/21    Authorization Type Aetna Medicare    Progress Note Due on Visit 10    PT Start Time 1017    PT Stop Time 1100    PT Time Calculation (min) 43 min    Activity Tolerance Patient tolerated treatment well    Behavior During Therapy WFL for tasks assessed/performed             Past Medical History:  Diagnosis Date   Arthritis    COPD (chronic obstructive pulmonary disease) ([place])    Hepatitis C    Hyperlipidemia    Hypertension    Osteoporosis    Stroke ([place])    Thrombocytosis     Past Surgical History:  Procedure Laterality Date   CHOLECYSTECTOMY     IR ANGIO INTRA EXTRACRAN SEL INTERNAL CAROTID BILAT MOD SED  02/24/2020   IR ANGIO VERTEBRAL SEL VERTEBRAL UNI R MOD SED  02/24/2020   IR ANGIOGRAM SELECTIVE EACH ADDITIONAL VESSEL  02/24/2020   IR IVC FILTER PLMT / S&I /IMG GUID/MOD SED  02/22/2020   IR IVC FILTER RETRIEVAL / S&I /IMG GUID/MOD SED  06/27/2020   IR RADIOLOGIST EVAL & MGMT  05/23/2020   IR THROMBECT VENO MECH MOD SED  02/24/2020   IR US GUIDE VASC ACCESS RIGHT  02/24/2020   IR US GUIDE VASC ACCESS RIGHT  02/24/2020   IR VENO SAGITTAL SINUS  02/24/2020   IR VENO/JUGULAR RIGHT  02/24/2020   KYPHOPLASTY N/A 03/01/2021   Procedure: Kyphoplasty Thoracic six, Thoracic seven with spinejack;  Surgeon: Vallarie Mare, MD;  Location: [facility];  Service: Neurosurgery;  Laterality: N/A;   RADIOLOGY WITH ANESTHESIA N/A 02/24/2020   Procedure: IR WITH ANESTHESIA;  Surgeon: Radiologist, Medication, MD;  Location: [facility];   Service: Radiology;  Laterality: N/A;   TONSILLECTOMY      There were no vitals filed for this visit.   Subjective Assessment - 09/18/21 1019     Subjective Pt. reports trying to remember to sit properly and stand up more frequently, and knees are better.  No knee pain today.    Limitations Sitting;Standing;Other (comment)   transfers   How long can you sit comfortably? no limitations but get stiff in knees    How long can you stand comfortably? no knee pain with standing, back limits    How long can you walk comfortably? 1.5-2 miles/day    Patient Stated Goals improve pain in knees    Currently in Pain? No/denies    Pain Onset Other (comment)   ~ 6 months   Pain Onset Other (comment)                [facility] PT Assessment - 09/18/21 0001       Assessment   Medical Diagnosis M25.561 (ICD-10-CM) - Pain in right knee    Referring Provider (PT) Dr. Cari Caraway, MD    Next MD Visit 03/14/2022    Prior Therapy none      Precautions   Precautions  None      Restrictions   Weight Bearing Restrictions No      Standardized Balance Assessment   Standardized Balance Assessment Berg Balance Test      Berg Balance Test   Sit to Stand Able to stand without using hands and stabilize independently    Standing Unsupported Able to stand safely 2 minutes    Sitting with Back Unsupported but Feet Supported on Floor or Stool Able to sit safely and securely 2 minutes    Stand to Sit Sits safely with minimal use of hands    Transfers Able to transfer safely, minor use of hands    Standing Unsupported with Eyes Closed Able to stand 10 seconds safely    Standing Unsupported with Feet Together Able to place feet together independently and stand 1 minute safely    From Standing, Reach Forward with Outstretched Arm Can reach confidently >25 cm (10")    From Standing Position, Pick up Object from Floor Able to pick up shoe safely and easily    From Standing Position, Turn to Look Behind Over each  Shoulder Looks behind from both sides and weight shifts well    Turn 360 Degrees Able to turn 360 degrees safely in 4 seconds or less    Standing Unsupported, Alternately Place Feet on Step/Stool Able to stand independently and safely and complete 8 steps in 20 seconds    Standing Unsupported, One Foot in Front Able to plae foot ahead of the other independently and hold 30 seconds    Standing on One Leg Able to lift leg independently and hold > 10 seconds   21 L, 24 R   Total Score 55    Berg comment: low risk for falls      Functional Gait  Assessment   Gait assessed  Yes    Gait Level Surface Walks 20 ft in less than 5.5 sec, no assistive devices, good speed, no evidence for imbalance, normal gait pattern, deviates no more than 6 in outside of the 12 in walkway width.    Change in Gait Speed Able to smoothly change walking speed without loss of balance or gait deviation. Deviate no more than 6 in outside of the 12 in walkway width.    Gait with Horizontal Head Turns Performs head turns smoothly with no change in gait. Deviates no more than 6 in outside 12 in walkway width    Gait with Vertical Head Turns Performs head turns with no change in gait. Deviates no more than 6 in outside 12 in walkway width.    Gait and Pivot Turn Pivot turns safely within 3 sec and stops quickly with no loss of balance.    Step Over Obstacle Is able to step over 2 stacked shoe boxes taped together (9 in total height) without changing gait speed. No evidence of imbalance.    Gait with Narrow Base of Support Ambulates 7-9 steps.    Gait with Eyes Closed Walks 20 ft, no assistive devices, good speed, no evidence of imbalance, normal gait pattern, deviates no more than 6 in outside 12 in walkway width. Ambulates 20 ft in less than 7 sec.    Ambulating Backwards Walks 20 ft, no assistive devices, good speed, no evidence for imbalance, normal gait    Steps Alternating feet, must use rail.    Total Score 28    FGA  comment: low risk for falls  [facility] Adult PT Treatment/Exercise - 09/18/21 0001       Self-Care   Self-Care Other Self-Care Comments    Other Self-Care Comments  recommendations for activity modification to avoid extreme deep knee bends and pain      Knee/Hip Exercises: Standing   Forward Lunges Both;1 set;10 reps    Forward Lunges Limitations UE support on counter, cues for technique    Functional Squat 10 reps    Functional Squat Limitations UE support on counter, cues for technique    Other Standing Knee Exercises tandem stance x 30 sec bil, tandem walking, tandem retro walking (in hallway)                     PT Education - 09/18/21 1116     Education Details education on today's balance assessment, HEP to address tandem balance, also on activity modifications to avoid deep knee bends and HEP for strengthening.    Person(s) Educated Patient;Spouse    Methods Explanation;Demonstration;Handout    Comprehension Verbalized understanding;Returned demonstration              PT Short Term Goals - 09/18/21 1124       PT SHORT TERM GOAL #1   Title Patient will be assessed for balance impairments- DGI/Berg    Time 1    Period Weeks    Status Achieved   09/18/21- BERG and FGA performed.   Target Date 09/20/21               PT Long Term Goals - 09/18/21 1124       PT LONG TERM GOAL #1   Title Pt. will be indendent with HEP to improve functional outcomes.    Time 6    Period Weeks    Status On-going   09/18/21- given HEP   Target Date 10/25/21      PT LONG TERM GOAL #2   Title Pt. will demonstrate decreased fall risk with goal TBD.    Time 6    Period Weeks    Status Achieved   09/18/21- Berg 55/56 and FGA 28/30 indicate low risk for falls.     PT LONG TERM GOAL #3   Title Pt. will report 75% improvement in incidence/severity of bil knee pain.    Time 6    Period Weeks    Status On-going   09/18/2021- 8/10  pain with deep knee bend and difficulty returning to standing.   Target Date 10/25/21                   Plan - 09/18/21 1118     Clinical Impression Statement Patient participated in balance assessment today, she had very good balance overall, scoring 55/56 on Berg and 28/30 on FGA, indicating very low risk of falls.  She was primarily only challenged with tandem stance (only able to hold for 10 sec) and tandem gait (initially lost balance but was then able to walk 7 steps foward next to wall for safety).  She was given HEP to address these deficits.  Her knee pain overall is better with standing up after sitting, as she no longer sits on her legs.  However she still reports pain with returning from deep knee bends after getting pans from low shelves, so she was also given squats and lunges to strengthen these muscles with these movements and educated on activity modifications to avoid deep knee bends if possible.  Because of her overall improvement and low risk  for falls, decreased visits to 1x/week to review HEP and further education.    Personal Factors and Comorbidities Age;Other;Comorbidity 3+    Comorbidities arthritis, COPD, HTN, Osteoporosis, history of TIA and CVA, cognitive impairments, plycthemia vera, thrombocytosis, Aflutter.    Examination-Activity Limitations Stand;Sit    Stability/Clinical Decision Making Evolving/Moderate complexity    Rehab Potential Good    PT Frequency --   1-2x/week   PT Duration 6 weeks    PT Treatment/Interventions ADLs/Self Care Home Management;Moist Heat;Cryotherapy;Gait training;Stair training;Functional mobility training;Therapeutic activities;Therapeutic exercise;Balance training;Neuromuscular re-education;Patient/family education;Manual techniques;Joint Manipulations;Taping;Passive range of motion;Dry needling    PT Next Visit Plan review HEP, education on bending/lifting, and avoiding knee pain.    Consulted and Agree with Plan of Care Patient              Patient will benefit from skilled therapeutic intervention in order to improve the following deficits and impairments:  Decreased activity tolerance, Pain, Decreased balance, Decreased safety awareness, Impaired flexibility, Increased muscle spasms  Visit Diagnosis: Chronic pain of left knee  Chronic pain of right knee     Problem List Patient Active Problem List   Diagnosis Date Noted   Cognitive deficit, post-stroke 07/13/2020   Cerebral venous sinus thrombosis 03/30/2020   Presence of IVC filter    Benign essential HTN    Peripheral edema    Impulsive    Hypoalbuminemia due to protein-calorie malnutrition ([facility])    Anemia    Prediabetes    Steroid-induced hyperglycemia    Embolic stroke ([place]) 52/77/8242   Dysphagia    Vascular headache    Essential thrombocytosis ([facility])    Venous thrombosis    Polycythemia    Encounter for central line care    Nontraumatic subcortical hemorrhage of right cerebral hemisphere ([facility])    Abnormal CT of the chest    Atrial flutter with rapid ventricular response ([place])    Pulmonary embolus ([place]) 02/22/2020   Polycythemia vera ([place]) 02/17/2020   Likely Cerebral amyloid angiopathy ([facility]) 02/17/2020   IVH (intraventricular hemorrhage) ([place]) 02/14/2020   TIA (transient ischemic attack) 08/17/2014   Acute sinusitis 08/16/2014   Headache 08/15/2014   Hypothyroidism 08/15/2014   Hepatitis C    Hyperlipidemia    Hypertension     Rennie Natter, PT, DPT 09/18/2021, 11:27 AM  [facility] [address]  [place], [place], [address] Phone: [phone]   Fax:  [phone]  Name: [name] MRN: [id number] Date of Birth: 1946-09-25

## 2021-09-25 ENCOUNTER — Other Ambulatory Visit: Payer: Self-pay

## 2021-09-25 ENCOUNTER — Ambulatory Visit: Payer: Medicare HMO

## 2021-09-25 DIAGNOSIS — M25562 Pain in left knee: Secondary | ICD-10-CM

## 2021-09-25 DIAGNOSIS — M25561 Pain in right knee: Secondary | ICD-10-CM | POA: Diagnosis not present

## 2021-09-25 DIAGNOSIS — G8929 Other chronic pain: Secondary | ICD-10-CM | POA: Diagnosis not present

## 2021-09-25 DIAGNOSIS — R262 Difficulty in walking, not elsewhere classified: Secondary | ICD-10-CM | POA: Diagnosis not present

## 2021-09-25 NOTE — Therapy (Signed)
[facility] [address]  [place], [place], [address] Phone: [phone]   Fax:  [phone]  Physical Therapy Treatment  Patient Details  Name: SAUL DORSI MRN: [id number] Date of Birth: [birth year] Referring Provider (PT): Dr. Cari Caraway, MD   Encounter Date: 09/25/2021   PT End of Session - 09/25/21 1116     Visit Number 3    Number of Visits 6    Date for PT Re-Evaluation 10/26/21    Authorization Type [employer] Medicare    Progress Note Due on Visit 10    PT Start Time 1019    PT Stop Time 1100    PT Time Calculation (min) 41 min    Activity Tolerance Patient tolerated treatment well    Behavior During Therapy WFL for tasks assessed/performed             Past Medical History:  Diagnosis Date   Arthritis    COPD (chronic obstructive pulmonary disease) ([place])    Hepatitis C    Hyperlipidemia    Hypertension    Osteoporosis    Stroke ([place])    Thrombocytosis     Past Surgical History:  Procedure Laterality Date   CHOLECYSTECTOMY     IR ANGIO INTRA EXTRACRAN SEL INTERNAL CAROTID BILAT MOD SED  02/24/2020   IR ANGIO VERTEBRAL SEL VERTEBRAL UNI R MOD SED  02/24/2020   IR ANGIOGRAM SELECTIVE EACH ADDITIONAL VESSEL  02/24/2020   IR IVC FILTER PLMT / S&I /IMG GUID/MOD SED  02/22/2020   IR IVC FILTER RETRIEVAL / S&I /IMG GUID/MOD SED  06/27/2020   IR RADIOLOGIST EVAL & MGMT  05/23/2020   IR THROMBECT VENO MECH MOD SED  02/24/2020   IR US GUIDE VASC ACCESS RIGHT  02/24/2020   IR US GUIDE VASC ACCESS RIGHT  02/24/2020   IR VENO SAGITTAL SINUS  02/24/2020   IR VENO/JUGULAR RIGHT  02/24/2020   KYPHOPLASTY N/A 03/01/2021   Procedure: Kyphoplasty Thoracic six, Thoracic seven with spinejack;  Surgeon: Vallarie Mare, MD;  Location: [facility];  Service: Neurosurgery;  Laterality: N/A;   RADIOLOGY WITH ANESTHESIA N/A 02/24/2020   Procedure: IR WITH ANESTHESIA;  Surgeon: Radiologist, Medication, MD;  Location: [facility];   Service: Radiology;  Laterality: N/A;   TONSILLECTOMY      There were no vitals filed for this visit.   Subjective Assessment - 09/25/21 1023     Subjective Pt reports that she has been keeping up with her home exercises, no pain when walking or moving but pain brought on when squatting.    Patient Stated Goals improve pain in knees    Currently in Pain? No/denies                               [facility] Adult PT Treatment/Exercise - 09/25/21 0001       Self-Care   Self-Care Lifting    Lifting edu on lifting with good body mechanics and avoiding deep knee bends      Exercises   Exercises Knee/Hip      Knee/Hip Exercises: Aerobic   Recumbent Bike L1x60min      Knee/Hip Exercises: Standing   Forward Lunges Both;2 sets;10 reps;2 seconds    Forward Lunges Limitations UE support on counter, cues for technique    Hip Abduction AROM;Both;10 reps;Knee straight    Abduction Limitations with counter support    Hip Extension AROM;Both;10  reps;Knee straight    Extension Limitations with counter support    Functional Squat 2 sets;10 reps    Functional Squat Limitations UE support on counter      Knee/Hip Exercises: Seated   Long Arc Quad Strengthening;Both;10 reps    Long Arc Quad Limitations ball squeeze    Ball Squeeze 20x3"      Knee/Hip Exercises: Supine   Straight Leg Raises Strengthening;Both;10 reps    Straight Leg Raises Limitations c/o pulling in hip joint                       PT Short Term Goals - 09/18/21 1124       PT SHORT TERM GOAL #1   Title Patient will be assessed for balance impairments- DGI/Berg    Time 1    Period Weeks    Status Achieved   09/18/21- BERG and FGA performed.   Target Date 09/20/21               PT Long Term Goals - 09/18/21 1124       PT LONG TERM GOAL #1   Title Pt. will be indendent with HEP to improve functional outcomes.    Time 6    Period Weeks    Status On-going   09/18/21- given HEP    Target Date 10/25/21      PT LONG TERM GOAL #2   Title Pt. will demonstrate decreased fall risk with goal TBD.    Time 6    Period Weeks    Status Achieved   09/18/21- Berg 55/56 and FGA 28/30 indicate low risk for falls.     PT LONG TERM GOAL #3   Title Pt. will report 75% improvement in incidence/severity of bil knee pain.    Time 6    Period Weeks    Status On-going   09/18/2021- 8/10 pain with deep knee bend and difficulty returning to standing.   Target Date 10/25/21                   Plan - 09/25/21 1119     Clinical Impression Statement Pt was able to complete all exercises with minimal complaints. Reviewed lifting techniques with her to avoid deep squatting but also with good body mechanics. Cues needed to avoid knees over toes with the fwd lunges today. Progressed VMO targeted exercises today to help with stabilization of the knee joint when squatting and updated HEP accordingly. She did have a report of mild B hip joint pain with SLR and had some muscle fatigue, we talked about avoiding pain with exercises to prevent overusing muscles.    Personal Factors and Comorbidities Age;Other;Comorbidity 3+    Comorbidities arthritis, COPD, HTN, Osteoporosis, history of TIA and CVA, cognitive impairments, plycthemia vera, thrombocytosis, Aflutter.    PT Frequency 1x / week    PT Duration 6 weeks    PT Treatment/Interventions ADLs/Self Care Home Management;Moist Heat;Cryotherapy;Gait training;Stair training;Functional mobility training;Therapeutic activities;Therapeutic exercise;Balance training;Neuromuscular re-education;Patient/family education;Manual techniques;Joint Manipulations;Taping;Passive range of motion;Dry needling    PT Next Visit Plan hip strengthening, HEP review and updates    Consulted and Agree with Plan of Care Patient             Patient will benefit from skilled therapeutic intervention in order to improve the following deficits and impairments:  Decreased  activity tolerance, Pain, Decreased balance, Decreased safety awareness, Impaired flexibility, Increased muscle spasms  Visit Diagnosis: Chronic pain of left knee  Chronic pain of right knee     Problem List Patient Active Problem List   Diagnosis Date Noted   Cognitive deficit, post-stroke 07/13/2020   Cerebral venous sinus thrombosis 03/30/2020   Presence of IVC filter    Benign essential HTN    Peripheral edema    Impulsive    Hypoalbuminemia due to protein-calorie malnutrition (HCC)    Anemia    Prediabetes    Steroid-induced hyperglycemia    Embolic stroke ([place]) 02/33/4356   Dysphagia    Vascular headache    Essential thrombocytosis (HCC)    Venous thrombosis    Polycythemia    Encounter for central line care    Nontraumatic subcortical hemorrhage of right cerebral hemisphere ([place])    Abnormal CT of the chest    Atrial flutter with rapid ventricular response ([place])    Pulmonary embolus ([place]) 02/22/2020   Polycythemia vera ([place]) 02/17/2020   Likely Cerebral amyloid angiopathy ([place]) 02/17/2020   IVH (intraventricular hemorrhage) ([place]) 02/14/2020   TIA (transient ischemic attack) 08/17/2014   Acute sinusitis 08/16/2014   Headache 08/15/2014   Hypothyroidism 08/15/2014   Hepatitis C    Hyperlipidemia    Hypertension     Artist Pais, PTA 09/25/2021, 11:55 AM  [facility] [address]  [place], [place], [address] Phone: [phone]   Fax:  [phone]  Name: TESHIA MAHONE MRN: [id number] Date of Birth: [birth year]

## 2021-09-27 ENCOUNTER — Encounter: Payer: Medicare HMO | Admitting: Physical Therapy

## 2021-10-02 ENCOUNTER — Encounter: Payer: Self-pay | Admitting: Physical Therapy

## 2021-10-02 ENCOUNTER — Ambulatory Visit: Payer: Medicare HMO | Admitting: Physical Therapy

## 2021-10-02 ENCOUNTER — Other Ambulatory Visit: Payer: Self-pay

## 2021-10-02 DIAGNOSIS — G8929 Other chronic pain: Secondary | ICD-10-CM | POA: Diagnosis not present

## 2021-10-02 DIAGNOSIS — R262 Difficulty in walking, not elsewhere classified: Secondary | ICD-10-CM | POA: Diagnosis not present

## 2021-10-02 DIAGNOSIS — M25562 Pain in left knee: Secondary | ICD-10-CM

## 2021-10-02 DIAGNOSIS — M25561 Pain in right knee: Secondary | ICD-10-CM | POA: Diagnosis not present

## 2021-10-02 NOTE — Therapy (Signed)
[facility] [address]  [place], [place], [address] Phone: [phone]   Fax:  [phone]  Physical Therapy Treatment  Patient Details  Name: [name] MRN: [id number] Date of Birth: [birth year] Referring Provider (PT): Dr. Cari Caraway, MD   Encounter Date: 10/02/2021   PT End of Session - 10/02/21 1021     Visit Number 4    Number of Visits 6    Date for PT Re-Evaluation 10/26/21    Authorization Type [employer]na Medicare    Progress Note Due on Visit 10    PT Start Time 1018    PT Stop Time 1100    PT Time Calculation (min) 42 min    Activity Tolerance Patient tolerated treatment well    Behavior During Therapy WFL for tasks assessed/performed             Past Medical History:  Diagnosis Date   Arthritis    COPD (chronic obstructive pulmonary disease) ([place])    Hepatitis C    Hyperlipidemia    Hypertension    Osteoporosis    Stroke ([place])    Thrombocytosis     Past Surgical History:  Procedure Laterality Date   CHOLECYSTECTOMY     IR ANGIO INTRA EXTRACRAN SEL INTERNAL CAROTID BILAT MOD SED  02/24/2020   IR ANGIO VERTEBRAL SEL VERTEBRAL UNI R MOD SED  02/24/2020   IR ANGIOGRAM SELECTIVE EACH ADDITIONAL VESSEL  02/24/2020   IR IVC FILTER PLMT / S&I /IMG GUID/MOD SED  02/22/2020   IR IVC FILTER RETRIEVAL / S&I /IMG GUID/MOD SED  06/27/2020   IR RADIOLOGIST EVAL & MGMT  05/23/2020   IR THROMBECT VENO MECH MOD SED  02/24/2020   IR US GUIDE VASC ACCESS RIGHT  02/24/2020   IR US GUIDE VASC ACCESS RIGHT  02/24/2020   IR VENO SAGITTAL SINUS  02/24/2020   IR VENO/JUGULAR RIGHT  02/24/2020   KYPHOPLASTY N/A 03/01/2021   Procedure: Kyphoplasty Thoracic six, Thoracic seven with spinejack;  Surgeon: Vallarie Mare, MD;  Location: [facility];  Service: Neurosurgery;  Laterality: N/A;   RADIOLOGY WITH ANESTHESIA N/A 02/24/2020   Procedure: IR WITH ANESTHESIA;  Surgeon: Radiologist, Medication, MD;  Location: [facility];   Service: Radiology;  Laterality: N/A;   TONSILLECTOMY      There were no vitals filed for this visit.   Subjective Assessment - 10/02/21 1021     Subjective Patient feels that her knees are getting better.  Exercises are helping, as is sitting posture.    Patient Stated Goals improve pain in knees    Currently in Pain? No/denies                               [facility] Adult PT Treatment/Exercise - 10/02/21 0001       Exercises   Exercises Knee/Hip      Knee/Hip Exercises: Aerobic   Recumbent Bike L1x23mn      Knee/Hip Exercises: Machines for Strengthening   Cybex Knee Flexion 15# 2 x 10    Cybex Leg Press 20# 2 x 15      Knee/Hip Exercises: Standing   Forward Lunges Both;2 sets;10 reps;2 seconds    Forward Lunges Limitations UE support on counter, cues for technique    Functional Squat 10 reps;3 sets   IASTM after second set, reports no difference in symptoms, just "soreness" in knees from exercise.  Functional Squat Limitations UE support on chair, mat table behind, just touching table before returning to incraase depth      Manual Therapy   Manual Therapy Soft tissue mobilization    Manual therapy comments to bil knees to decrease pain/pull    Soft tissue mobilization IASTM with stainles steel tools to bil quads                       PT Short Term Goals - 09/18/21 1124       PT SHORT TERM GOAL #1   Title Patient will be assessed for balance impairments- DGI/Berg    Time 1    Period Weeks    Status Achieved   09/18/21- BERG and FGA performed.   Target Date 09/20/21               PT Long Term Goals - 10/02/21 1022       PT LONG TERM GOAL #1   Title Pt. will be indendent with HEP to improve functional outcomes.    Time 6    Period Weeks    Status On-going   09/18/21- given HEP  11/22- met for current     PT LONG TERM GOAL #2   Title Pt. will demonstrate decreased fall risk with goal TBD.    Time 6    Period Weeks     Status Achieved   09/18/21- Berg 55/56 and FGA 28/30 indicate low risk for falls.     PT LONG TERM GOAL #3   Title Pt. will report 75% improvement in incidence/severity of bil knee pain.    Time 6    Period Weeks    Status On-going   09/18/2021- 8/10 pain with deep knee bend and difficulty returning to standing.  11/22- reports more soreness than pain with knee bends                  Plan - 10/02/21 1148     Clinical Impression Statement Patient was able to participate well throughout session, focusing on squats and lunges today, using targets to increase depth of exercise, reporting only soreness but no pain with exercises.  Also progressed to weight machines today, able to tolerate low weight without increased pain.  Encouraged to consider and explore returning to gym based exercise program again, especially with colder weather limiting walking outside.  She would benefit from continued skilled therapy.    Personal Factors and Comorbidities Age;Other;Comorbidity 3+    Comorbidities arthritis, COPD, HTN, Osteoporosis, history of TIA and CVA, cognitive impairments, plycthemia vera, thrombocytosis, Aflutter.    PT Frequency 1x / week    PT Duration 6 weeks    PT Treatment/Interventions ADLs/Self Care Home Management;Moist Heat;Cryotherapy;Gait training;Stair training;Functional mobility training;Therapeutic activities;Therapeutic exercise;Balance training;Neuromuscular re-education;Patient/family education;Manual techniques;Joint Manipulations;Taping;Passive range of motion;Dry needling    PT Next Visit Plan hip strengthening, HEP review and updates    Consulted and Agree with Plan of Care Patient             Patient will benefit from skilled therapeutic intervention in order to improve the following deficits and impairments:  Decreased activity tolerance, Pain, Decreased balance, Decreased safety awareness, Impaired flexibility, Increased muscle spasms  Visit Diagnosis: Chronic  pain of left knee  Chronic pain of right knee  Difficulty in walking, not elsewhere classified     Problem List Patient Active Problem List   Diagnosis Date Noted   Cognitive deficit, post-stroke 07/13/2020   Cerebral venous  sinus thrombosis 03/30/2020   Presence of IVC filter    Benign essential HTN    Peripheral edema    Impulsive    Hypoalbuminemia due to protein-calorie malnutrition (HCC)    Anemia    Prediabetes    Steroid-induced hyperglycemia    Embolic stroke ([place]) 09/09/1313   Dysphagia    Vascular headache    Essential thrombocytosis (HCC)    Venous thrombosis    Polycythemia    Encounter for central line care    Nontraumatic subcortical hemorrhage of right cerebral hemisphere [facility])    Abnormal CT of the chest    Atrial flutter with rapid ventricular response (HCC)    Pulmonary embolus ([place]) 02/22/2020   Polycythemia vera ([place]) 02/17/2020   Likely Cerebral amyloid angiopathy ([place]) 02/17/2020   IVH (intraventricular hemorrhage) ([place]) 02/14/2020   TIA (transient ischemic attack) 08/17/2014   Acute sinusitis 08/16/2014   Headache 08/15/2014   Hypothyroidism 08/15/2014   Hepatitis C    Hyperlipidemia    Hypertension     Rennie Natter, PT, DPT 10/02/2021, 11:56 AM  [facility] [address]  [place], [place], [address] Phone: [phone]   Fax:  [phone]  Name: SYNAI PRETTYMAN MRN: [id number] Date of Birth: 14-Feb-1946

## 2021-10-03 DIAGNOSIS — I10 Essential (primary) hypertension: Secondary | ICD-10-CM | POA: Diagnosis not present

## 2021-10-03 DIAGNOSIS — M1812 Unilateral primary osteoarthritis of first carpometacarpal joint, left hand: Secondary | ICD-10-CM | POA: Diagnosis not present

## 2021-10-03 DIAGNOSIS — J441 Chronic obstructive pulmonary disease with (acute) exacerbation: Secondary | ICD-10-CM | POA: Diagnosis not present

## 2021-10-03 DIAGNOSIS — E782 Mixed hyperlipidemia: Secondary | ICD-10-CM | POA: Diagnosis not present

## 2021-10-03 DIAGNOSIS — I610 Nontraumatic intracerebral hemorrhage in hemisphere, subcortical: Secondary | ICD-10-CM | POA: Diagnosis not present

## 2021-10-03 DIAGNOSIS — J449 Chronic obstructive pulmonary disease, unspecified: Secondary | ICD-10-CM | POA: Diagnosis not present

## 2021-10-03 DIAGNOSIS — K219 Gastro-esophageal reflux disease without esophagitis: Secondary | ICD-10-CM | POA: Diagnosis not present

## 2021-10-03 DIAGNOSIS — I639 Cerebral infarction, unspecified: Secondary | ICD-10-CM | POA: Diagnosis not present

## 2021-10-03 DIAGNOSIS — D649 Anemia, unspecified: Secondary | ICD-10-CM | POA: Diagnosis not present

## 2021-10-03 DIAGNOSIS — E039 Hypothyroidism, unspecified: Secondary | ICD-10-CM | POA: Diagnosis not present

## 2021-10-03 DIAGNOSIS — M810 Age-related osteoporosis without current pathological fracture: Secondary | ICD-10-CM | POA: Diagnosis not present

## 2021-10-09 ENCOUNTER — Ambulatory Visit: Payer: Medicare HMO

## 2021-10-09 ENCOUNTER — Other Ambulatory Visit: Payer: Self-pay

## 2021-10-09 DIAGNOSIS — R262 Difficulty in walking, not elsewhere classified: Secondary | ICD-10-CM

## 2021-10-09 DIAGNOSIS — M25562 Pain in left knee: Secondary | ICD-10-CM | POA: Diagnosis not present

## 2021-10-09 DIAGNOSIS — M25561 Pain in right knee: Secondary | ICD-10-CM | POA: Diagnosis not present

## 2021-10-09 DIAGNOSIS — G8929 Other chronic pain: Secondary | ICD-10-CM | POA: Diagnosis not present

## 2021-10-09 NOTE — Therapy (Signed)
[facility] [address]  [place], [place], [address] Phone: [phone]   Fax:  [phone]  Physical Therapy Treatment  Patient Details  Name: [name] MRN: [id number] Date of Birth: 26-Sep-1946 Referring Provider (PT): Dr. Cari Caraway, MD   Encounter Date: 10/09/2021   PT End of Session - 10/09/21 1157     Visit Number 5    Number of Visits 6    Date for PT Re-Evaluation 10/26/21    Authorization Type [employer]na Medicare    Progress Note Due on Visit 10    PT Start Time 1015    PT Stop Time 1100    PT Time Calculation (min) 45 min    Activity Tolerance Patient tolerated treatment well    Behavior During Therapy WFL for tasks assessed/performed             Past Medical History:  Diagnosis Date   Arthritis    COPD (chronic obstructive pulmonary disease) ([place])    Hepatitis C    Hyperlipidemia    Hypertension    Osteoporosis    Stroke ([place])    Thrombocytosis     Past Surgical History:  Procedure Laterality Date   CHOLECYSTECTOMY     IR ANGIO INTRA EXTRACRAN SEL INTERNAL CAROTID BILAT MOD SED  02/24/2020   IR ANGIO VERTEBRAL SEL VERTEBRAL UNI R MOD SED  02/24/2020   IR ANGIOGRAM SELECTIVE EACH ADDITIONAL VESSEL  02/24/2020   IR IVC FILTER PLMT / S&I /IMG GUID/MOD SED  02/22/2020   IR IVC FILTER RETRIEVAL / S&I /IMG GUID/MOD SED  06/27/2020   IR RADIOLOGIST EVAL & MGMT  05/23/2020   IR THROMBECT VENO MECH MOD SED  02/24/2020   IR US GUIDE VASC ACCESS RIGHT  02/24/2020   IR US GUIDE VASC ACCESS RIGHT  02/24/2020   IR VENO SAGITTAL SINUS  02/24/2020   IR VENO/JUGULAR RIGHT  02/24/2020   KYPHOPLASTY N/A 03/01/2021   Procedure: Kyphoplasty Thoracic six, Thoracic seven with spinejack;  Surgeon: Vallarie Mare, MD;  Location: [facility];  Service: Neurosurgery;  Laterality: N/A;   RADIOLOGY WITH ANESTHESIA N/A 02/24/2020   Procedure: IR WITH ANESTHESIA;  Surgeon: Radiologist, Medication, MD;  Location: [facility];   Service: Radiology;  Laterality: N/A;   TONSILLECTOMY      There were no vitals filed for this visit.   Subjective Assessment - 10/09/21 1015     Subjective Pt notes that she is noticing improvements with home activities, last time there was some soreness in her knees from the exercises.    Patient Stated Goals improve pain in knees    Currently in Pain? No/denies                               [facility] Adult PT Treatment/Exercise - 10/09/21 0001       Exercises   Exercises Knee/Hip      Knee/Hip Exercises: Aerobic   Recumbent Bike L2x23mn      Knee/Hip Exercises: Standing   Terminal Knee Extension Strengthening;Both;10 reps;Theraband    Theraband Level (Terminal Knee Extension) Level 3 (Green);Level 4 (Blue)    Terminal Knee Extension Limitations progressed to blue TB    Hip Abduction Stengthening;Both;10 reps;Knee straight    Abduction Limitations yellow TB at ankles    Hip Extension Stengthening;Both;10 reps;Knee straight    Extension Limitations yellow TB at ankles      Knee/Hip  Exercises: Seated   Sit to Sand 20 reps;without UE support   2nd set progressed with iso ball squeeze     Manual Therapy   Manual Therapy Soft tissue mobilization    Soft tissue mobilization IASTM with rolling stick to bil quads                     PT Education - 10/09/21 1157     Education Details HEP update    Person(s) Educated Patient    Methods Explanation;Demonstration;Handout    Comprehension Verbalized understanding;Returned demonstration              PT Short Term Goals - 09/18/21 1124       PT SHORT TERM GOAL #1   Title Patient will be assessed for balance impairments- DGI/Berg    Time 1    Period Weeks    Status Achieved   09/18/21- BERG and FGA performed.   Target Date 09/20/21               PT Long Term Goals - 10/02/21 1022       PT LONG TERM GOAL #1   Title Pt. will be indendent with HEP to improve functional outcomes.     Time 6    Period Weeks    Status On-going   09/18/21- given HEP  11/22- met for current     PT LONG TERM GOAL #2   Title Pt. will demonstrate decreased fall risk with goal TBD.    Time 6    Period Weeks    Status Achieved   09/18/21- Berg 55/56 and FGA 28/30 indicate low risk for falls.     PT LONG TERM GOAL #3   Title Pt. will report 75% improvement in incidence/severity of bil knee pain.    Time 6    Period Weeks    Status On-going   09/18/2021- 8/10 pain with deep knee bend and difficulty returning to standing.  11/22- reports more soreness than pain with knee bends                  Plan - 10/09/21 1104     Clinical Impression Statement Pt notably is making progress with her function at home, she noted that her squatting ability has improved with less knee pain. She shows some hip weakness with the isolated exercises at counter top. Needed cues to prevent compensation with the body during TKE with TB. She has tightness mostly in her L lateral quads which could contribute to her knee pain. I showed her how to self massage at home using a rolling pin. Pt does feel that she could transition to her HEP by the end of her POC due to progress.    Personal Factors and Comorbidities Age;Other;Comorbidity 3+    Comorbidities arthritis, COPD, HTN, Osteoporosis, history of TIA and CVA, cognitive impairments, plycthemia vera, thrombocytosis, Aflutter.    PT Frequency 1x / week    PT Duration 6 weeks    PT Treatment/Interventions ADLs/Self Care Home Management;Moist Heat;Cryotherapy;Gait training;Stair training;Functional mobility training;Therapeutic activities;Therapeutic exercise;Balance training;Neuromuscular re-education;Patient/family education;Manual techniques;Joint Manipulations;Taping;Passive range of motion;Dry needling    PT Next Visit Plan hip strengthening, HEP review and updates    Consulted and Agree with Plan of Care Patient             Patient will benefit from skilled  therapeutic intervention in order to improve the following deficits and impairments:  Decreased activity tolerance, Pain, Decreased balance, Decreased safety  awareness, Impaired flexibility, Increased muscle spasms  Visit Diagnosis: Chronic pain of left knee  Chronic pain of right knee  Difficulty in walking, not elsewhere classified     Problem List Patient Active Problem List   Diagnosis Date Noted   Cognitive deficit, post-stroke 07/13/2020   Cerebral venous sinus thrombosis 03/30/2020   Presence of IVC filter    Benign essential HTN    Peripheral edema    Impulsive    Hypoalbuminemia due to protein-calorie malnutrition (HCC)    Anemia    Prediabetes    Steroid-induced hyperglycemia    Embolic stroke ([place]) 82/99/3716   Dysphagia    Vascular headache    Essential thrombocytosis (HCC)    Venous thrombosis    Polycythemia    Encounter for central line care    Nontraumatic subcortical hemorrhage of right cerebral hemisphere ([place])    Abnormal CT of the chest    Atrial flutter with rapid ventricular response ([place])    Pulmonary embolus ([place]) 02/22/2020   Polycythemia vera ([place]) 02/17/2020   Likely Cerebral amyloid angiopathy ([place]) 02/17/2020   IVH (intraventricular hemorrhage) ([place]) 02/14/2020   TIA (transient ischemic attack) 08/17/2014   Acute sinusitis 08/16/2014   Headache 08/15/2014   Hypothyroidism 08/15/2014   Hepatitis C    Hyperlipidemia    Hypertension     Artist Pais, PTA 10/09/2021, 12:07 PM  [facility] [address]  [place], [place], [address] Phone: [phone]   Fax:  [phone]  Name: [name] MRN: [id number] Date of Birth: 1946-05-28

## 2021-10-11 ENCOUNTER — Encounter: Payer: Medicare HMO | Admitting: Physical Therapy

## 2021-10-11 DIAGNOSIS — R350 Frequency of micturition: Secondary | ICD-10-CM | POA: Diagnosis not present

## 2021-10-11 DIAGNOSIS — R351 Nocturia: Secondary | ICD-10-CM | POA: Diagnosis not present

## 2021-10-11 DIAGNOSIS — N3946 Mixed incontinence: Secondary | ICD-10-CM | POA: Diagnosis not present

## 2021-10-16 ENCOUNTER — Other Ambulatory Visit: Payer: Self-pay

## 2021-10-16 ENCOUNTER — Ambulatory Visit: Payer: Medicare HMO | Attending: Family Medicine

## 2021-10-16 DIAGNOSIS — M25562 Pain in left knee: Secondary | ICD-10-CM | POA: Diagnosis not present

## 2021-10-16 DIAGNOSIS — R262 Difficulty in walking, not elsewhere classified: Secondary | ICD-10-CM

## 2021-10-16 DIAGNOSIS — M25561 Pain in right knee: Secondary | ICD-10-CM | POA: Diagnosis not present

## 2021-10-16 DIAGNOSIS — G8929 Other chronic pain: Secondary | ICD-10-CM | POA: Diagnosis not present

## 2021-10-16 NOTE — Therapy (Addendum)
PHYSICAL THERAPY DISCHARGE SUMMARY  Visits from Start of Care: 6  Current functional level related to goals / functional outcomes: "Pt shows improvements with regards to knee pain. She notes the frequency of pain has overall decreased but with squatting it still bothers her a bit. Overall she notes that she has made much improvement with her abilities to squat with less pain. Reviewed HEP today, progressed to regular squats instead of mini. Still requires instruction to hinge hips during squatting motions. She wishes to go on hold from PT due to her functional progress and co-pay."  Placed on hold on 10/16/2021 and has not returned during 30 day hold time frame.    Remaining deficits: See below- occasional pain with low squats.    Education / Equipment: HEP   Plan: Patient agrees to discharge.   Patient is being discharged due to meeting the stated rehab goals.     Elizabeth J Whitman, PT, DPT 11/19/2021   [facility] [place] [address]  [address] [place], [place], [address] Phone: [phone]   Fax:  [phone]  Physical Therapy Treatment  Patient Details  Name: [name] MRN: [id number] Date of Birth: 01/23/1946 Referring Provider (PT): Dr. Wendy McNeill, MD   Encounter Date: 10/16/2021   PT End of Session - 10/16/21 1204     Visit Number 6    Number of Visits 6    Date for PT Re-Evaluation 10/26/21    Authorization Type [employer]    Progress Note Due on Visit 10    PT Start Time 1018    PT Stop Time 1101    PT Time Calculation (min) 43 min    Activity Tolerance Patient tolerated treatment well;Patient limited by pain    Behavior During Therapy WFL for tasks assessed/performed             Past Medical History:  Diagnosis Date   Arthritis    COPD (chronic obstructive pulmonary disease) (HCC)    Hepatitis C    Hyperlipidemia    Hypertension    Osteoporosis    Stroke (HCC)    Thrombocytosis      Past Surgical History:  Procedure Laterality Date   CHOLECYSTECTOMY     IR ANGIO INTRA EXTRACRAN SEL INTERNAL CAROTID BILAT MOD SED  02/24/2020   IR ANGIO VERTEBRAL SEL VERTEBRAL UNI R MOD SED  02/24/2020   IR ANGIOGRAM SELECTIVE EACH ADDITIONAL VESSEL  02/24/2020   IR IVC FILTER PLMT / S&I /IMG GUID/MOD SED  02/22/2020   IR IVC FILTER RETRIEVAL / S&I /IMG GUID/MOD SED  06/27/2020   IR RADIOLOGIST EVAL & MGMT  05/23/2020   IR THROMBECT VENO MECH MOD SED  02/24/2020   IR US GUIDE VASC ACCESS RIGHT  02/24/2020   IR US GUIDE VASC ACCESS RIGHT  02/24/2020   IR VENO SAGITTAL SINUS  02/24/2020   IR VENO/JUGULAR RIGHT  02/24/2020   KYPHOPLASTY N/A 03/01/2021   Procedure: Kyphoplasty Thoracic six, Thoracic seven with spinejack;  Surgeon: Thomas, Jonathan G, MD;  Location: [facility] OR;  Service: Neurosurgery;  Laterality: N/A;   RADIOLOGY WITH ANESTHESIA N/A 02/24/2020   Procedure: IR WITH ANESTHESIA;  Surgeon: Radiologist, Medication, MD;  Location: [facility] OR;  Service: Radiology;  Laterality: N/A;   TONSILLECTOMY      There were no vitals filed for this visit.   Subjective Assessment - 10/16/21 1021     Subjective Pt notes that her ability to squat has gotten easier but   with use of her arms, her R knee gets sore with the standing hip exercises.    Patient Stated Goals improve pain in knees    Currently in Pain? No/denies                               [facility] Adult PT Treatment/Exercise - 10/16/21 0001       Exercises   Exercises Knee/Hip      Knee/Hip Exercises: Standing   Forward Lunges Both;10 reps;2 seconds    Forward Lunges Limitations UE support on counter, cues for technique    Hip Abduction Stengthening;Both;10 reps;Knee straight;2 sets    Abduction Limitations yellow TB at ankles    Hip Extension Stengthening;Both;10 reps;Knee straight;2 sets    Extension Limitations yellow TB at ankles    Lateral Step Up Right;10 reps;Step Height: 6"    Forward Step Up Right;2 sets;10  reps;Step Height: 6"    Functional Squat 2 sets;10 reps;3 seconds    Functional Squat Limitations UE support, depth increased to pt tolerance                       PT Short Term Goals - 09/18/21 1124       PT SHORT TERM GOAL #1   Title Patient will be assessed for balance impairments- DGI/Berg    Time 1    Period Weeks    Status Achieved   09/18/21- BERG and FGA performed.   Target Date 09/20/21               PT Long Term Goals - 10/16/21 1024       PT LONG TERM GOAL #1   Title Pt. will be indendent with HEP to improve functional outcomes.    Time 6    Period Weeks    Status Achieved   10/16/21     PT LONG TERM GOAL #2   Title Pt. will demonstrate decreased fall risk with goal TBD.    Time 6    Period Weeks    Status Achieved   09/18/21- Berg 55/56 and FGA 28/30 indicate low risk for falls.     PT LONG TERM GOAL #3   Title Pt. will report 75% improvement in incidence/severity of bil knee pain.    Time 6    Period Weeks    Status Partially Met   10/16/21- 50% improvement in knee pain                  Plan - 10/16/21 1207     Clinical Impression Statement Pt shows improvements with regards to knee pain. She notes the frequency of pain has overall decreased but with squatting it still bothers her a bit. Overall she notes that she has made much improvement with her abilities to squat with less pain. Reviewed HEP today, progressed to regular squats instead of mini. Still requires instruction to hinge hips during squatting motions. She wishes to go on hold from PT due to her functional progress and co-pay.    Personal Factors and Comorbidities Age;Other;Comorbidity 3+    Comorbidities arthritis, COPD, HTN, Osteoporosis, history of TIA and CVA, cognitive impairments, plycthemia vera, thrombocytosis, Aflutter.    PT Frequency 1x / week    PT Duration 6 weeks    PT Treatment/Interventions ADLs/Self Care Home Management;Moist Heat;Cryotherapy;Gait  training;Stair training;Functional mobility training;Therapeutic activities;Therapeutic exercise;Balance training;Neuromuscular re-education;Patient/family education;Manual techniques;Joint Manipulations;Taping;Passive range of motion;Dry needling      PT Next Visit Plan hip strengthening, HEP review and updates    Consulted and Agree with Plan of Care Patient             Patient will benefit from skilled therapeutic intervention in order to improve the following deficits and impairments:  Decreased activity tolerance, Pain, Decreased balance, Decreased safety awareness, Impaired flexibility, Increased muscle spasms  Visit Diagnosis: Chronic pain of left knee  Chronic pain of right knee  Difficulty in walking, not elsewhere classified     Problem List Patient Active Problem List   Diagnosis Date Noted   Cognitive deficit, post-stroke 07/13/2020   Cerebral venous sinus thrombosis 03/30/2020   Presence of IVC filter    Benign essential HTN    Peripheral edema    Impulsive    Hypoalbuminemia due to protein-calorie malnutrition (HCC)    Anemia    Prediabetes    Steroid-induced hyperglycemia    Embolic stroke (HCC) 03/10/2020   Dysphagia    Vascular headache    Essential thrombocytosis (HCC)    Venous thrombosis    Polycythemia    Encounter for central line care    Nontraumatic subcortical hemorrhage of right cerebral hemisphere (HCC)    Abnormal CT of the chest    Atrial flutter with rapid ventricular response (HCC)    Pulmonary embolus (HCC) 02/22/2020   Polycythemia vera (HCC) 02/17/2020   Likely Cerebral amyloid angiopathy (HCC) 02/17/2020   IVH (intraventricular hemorrhage) (HCC) 02/14/2020   TIA (transient ischemic attack) 08/17/2014   Acute sinusitis 08/16/2014   Headache 08/15/2014   Hypothyroidism 08/15/2014   Hepatitis C    Hyperlipidemia    Hypertension      L , PTA 10/16/2021, 12:08 PM  [facility] [place] [address]  [address] [place], [place], [address] Phone: [phone]   Fax:  [phone]  Name: [name] MRN: [id number] Date of Birth: 04/27/1946    

## 2021-10-18 ENCOUNTER — Encounter: Payer: Medicare HMO | Admitting: Physical Therapy

## 2021-11-22 DIAGNOSIS — N3946 Mixed incontinence: Secondary | ICD-10-CM | POA: Diagnosis not present

## 2021-11-22 DIAGNOSIS — R350 Frequency of micturition: Secondary | ICD-10-CM | POA: Diagnosis not present

## 2021-11-26 DIAGNOSIS — M810 Age-related osteoporosis without current pathological fracture: Secondary | ICD-10-CM | POA: Diagnosis not present

## 2021-11-26 DIAGNOSIS — E782 Mixed hyperlipidemia: Secondary | ICD-10-CM | POA: Diagnosis not present

## 2021-11-26 DIAGNOSIS — I10 Essential (primary) hypertension: Secondary | ICD-10-CM | POA: Diagnosis not present

## 2021-11-26 DIAGNOSIS — I639 Cerebral infarction, unspecified: Secondary | ICD-10-CM | POA: Diagnosis not present

## 2021-11-26 DIAGNOSIS — J441 Chronic obstructive pulmonary disease with (acute) exacerbation: Secondary | ICD-10-CM | POA: Diagnosis not present

## 2021-11-26 DIAGNOSIS — E039 Hypothyroidism, unspecified: Secondary | ICD-10-CM | POA: Diagnosis not present

## 2021-11-26 DIAGNOSIS — G8929 Other chronic pain: Secondary | ICD-10-CM | POA: Diagnosis not present

## 2021-11-26 DIAGNOSIS — M1812 Unilateral primary osteoarthritis of first carpometacarpal joint, left hand: Secondary | ICD-10-CM | POA: Diagnosis not present

## 2021-11-26 DIAGNOSIS — K219 Gastro-esophageal reflux disease without esophagitis: Secondary | ICD-10-CM | POA: Diagnosis not present

## 2021-11-26 DIAGNOSIS — D649 Anemia, unspecified: Secondary | ICD-10-CM | POA: Diagnosis not present

## 2021-12-13 DIAGNOSIS — U071 COVID-19: Secondary | ICD-10-CM | POA: Diagnosis not present

## 2022-01-03 DIAGNOSIS — R350 Frequency of micturition: Secondary | ICD-10-CM | POA: Diagnosis not present

## 2022-01-03 DIAGNOSIS — N3946 Mixed incontinence: Secondary | ICD-10-CM | POA: Diagnosis not present

## 2022-01-17 DIAGNOSIS — N3946 Mixed incontinence: Secondary | ICD-10-CM | POA: Diagnosis not present

## 2022-01-30 DIAGNOSIS — N3946 Mixed incontinence: Secondary | ICD-10-CM | POA: Diagnosis not present

## 2022-02-14 DIAGNOSIS — M8000XA Age-related osteoporosis with current pathological fracture, unspecified site, initial encounter for fracture: Secondary | ICD-10-CM | POA: Diagnosis not present

## 2022-02-14 DIAGNOSIS — E039 Hypothyroidism, unspecified: Secondary | ICD-10-CM | POA: Diagnosis not present

## 2022-02-14 DIAGNOSIS — R7303 Prediabetes: Secondary | ICD-10-CM | POA: Diagnosis not present

## 2022-02-14 DIAGNOSIS — E782 Mixed hyperlipidemia: Secondary | ICD-10-CM | POA: Diagnosis not present

## 2022-02-14 DIAGNOSIS — Z79899 Other long term (current) drug therapy: Secondary | ICD-10-CM | POA: Diagnosis not present

## 2022-02-18 DIAGNOSIS — E782 Mixed hyperlipidemia: Secondary | ICD-10-CM | POA: Diagnosis not present

## 2022-02-18 DIAGNOSIS — E039 Hypothyroidism, unspecified: Secondary | ICD-10-CM | POA: Diagnosis not present

## 2022-02-18 DIAGNOSIS — K219 Gastro-esophageal reflux disease without esophagitis: Secondary | ICD-10-CM | POA: Diagnosis not present

## 2022-02-18 DIAGNOSIS — R32 Unspecified urinary incontinence: Secondary | ICD-10-CM | POA: Diagnosis not present

## 2022-02-18 DIAGNOSIS — E559 Vitamin D deficiency, unspecified: Secondary | ICD-10-CM | POA: Diagnosis not present

## 2022-02-18 DIAGNOSIS — J449 Chronic obstructive pulmonary disease, unspecified: Secondary | ICD-10-CM | POA: Diagnosis not present

## 2022-02-18 DIAGNOSIS — R7309 Other abnormal glucose: Secondary | ICD-10-CM | POA: Diagnosis not present

## 2022-02-18 DIAGNOSIS — Z79899 Other long term (current) drug therapy: Secondary | ICD-10-CM | POA: Diagnosis not present

## 2022-02-18 DIAGNOSIS — D473 Essential (hemorrhagic) thrombocythemia: Secondary | ICD-10-CM | POA: Diagnosis not present

## 2022-03-01 DIAGNOSIS — Z1389 Encounter for screening for other disorder: Secondary | ICD-10-CM | POA: Diagnosis not present

## 2022-03-01 DIAGNOSIS — Z0000 Encounter for general adult medical examination without abnormal findings: Secondary | ICD-10-CM | POA: Diagnosis not present

## 2022-03-14 ENCOUNTER — Telehealth: Payer: Self-pay | Admitting: Neurology

## 2022-03-14 ENCOUNTER — Encounter: Payer: Self-pay | Admitting: Neurology

## 2022-03-14 ENCOUNTER — Ambulatory Visit: Payer: Medicare HMO | Admitting: Neurology

## 2022-03-14 VITALS — BP 129/75 | HR 77 | Ht 62.0 in | Wt 150.4 lb

## 2022-03-14 DIAGNOSIS — R413 Other amnesia: Secondary | ICD-10-CM

## 2022-03-14 DIAGNOSIS — S22059A Unspecified fracture of T5-T6 vertebra, initial encounter for closed fracture: Secondary | ICD-10-CM | POA: Insufficient documentation

## 2022-03-14 DIAGNOSIS — G08 Intracranial and intraspinal phlebitis and thrombophlebitis: Secondary | ICD-10-CM

## 2022-03-14 DIAGNOSIS — G3184 Mild cognitive impairment, so stated: Secondary | ICD-10-CM | POA: Diagnosis not present

## 2022-03-14 NOTE — Patient Instructions (Signed)
I had a long discussion with the patient and her husband regarding her history of remote cerebral venous sinus thrombosis and she is done extremely well. Continue aspirin 81 mg daily.  Check follow-up MR venogram of the brain and MRI of the brain.  She also has mild cognitive impairment but is otherwise doing extremely well.  I recommend she increase participation in mentally challenging activities like solving crossword puzzles, playing bridge and sodoku.  Continue taking Prevagen 10 mg daily.  We also discussed memory compensation strategies.  She will return for follow-up with me in the future in a year or call earlier if necessary. ?

## 2022-03-14 NOTE — Telephone Encounter (Signed)
[employer] medicare sent to [facility] they obtain auth and call to schedule ?

## 2022-03-14 NOTE — Progress Notes (Addendum)
?[facility] ?[address] ?[place]. [place] [address] ?[phone] ? ?     OFFICE FOLLOW-UP NOTE ? ?[name] ?Date of Birth:  06-03-1946 ?Medical Record Number:  [id number]  ? ?HPI: [name] is a 76 year old pleasant Caucasian lady seen today for initial office follow-up visit following hospital admission for intracerebral hemorrhage in April 2021.  She is accompanied by her husband.  History is obtained from them and review of electronic medical records and I personally reviewed imaging films in PACS. ?She is a 76 year old lady with past medical history of hypertension who presented to [facility] on 02/13/2020 with sudden onset of severe headache.  She was sitting in front of the computer and noticed that headache on the top of her head which is quite severe and persisted without relief.  She came to the emergency room where routine blood work revealed evidence of thrombocytosis and CT scan showed intraventricular hemorrhage.  This involves mostly the right lateral ventricle and there was changes of chronic small vessel disease.  She was kept in the intensive care unit where blood pressure was tightly controlled and follow-up CT scan showed stable appearance of the hemorrhage.  MRI scan was obtained which also showed stable intraventricular hemorrhage and severe changes of small vessel disease and multiple foci of microhemorrhages raising possibility of amyloid angiopathy versus hypertensive microangiopathy.  MRA of the brain showed no large vessel stenosis or occlusion.  2D echo showed normal ejection fraction.  LDL cholesterol 92 mg percent.  Hemoglobin A1c was 6.1.  Initially the etiology of the hemorrhage was thought to be hypertensive but subsequently patient's showed neurological worsening and she developed new left temporal occipital hemorrhage.  MR venogram on 02/25/2020 raise possibility of deep cerebral venous thrombosis.  Patient became obtunded and less  responsive.  She started hypertonic saline note back to the ICU however she did not get better.  Subsequently a cerebral catheter angiogram was done 03/01/2020 by Dr. Manson Passey and after consent she did mechanical thrombectomy with recanalization of the vein of Galen and straight sinus with decreased clot burden in the torcula right transverse and sigmoid sinuses.  A residual nonocclusive clot was seen in the vein of Galen.  Patient had presented with thrombocytosis and she was diagnosed with polycythemia and followed by the hematology team and she underwent a pheresis.  She is started on hydroxyurea subsequently she developed thrombocytopenia and lab work suggested heparin-induced thrombocytopenia.  She was initially started on IV heparin for her venous sinus thrombosis but this was subsequently discontinued and she was started on IV argatroban which was continued till she was transferred to rehab and was able to swallow then she was switched to Pradaxa.  Patient spent 3 weeks in rehab and is gradually improved.  His speech and language are improved.  She is living at home with his husband.  She still has some word finding difficulties and occasional paraphasic errors but she is still getting ongoing speech therapy which is helpful.  She has no physical weakness and gait or balance problems.  Her platelet count is been followed regularly by Dr. Jana Hakim hematologist and she has recently been started back on hydroxyurea and her diagnosis is no change to essential thrombocytosis rather than polycythemia vera.  Patient's blood pressure has been running quite low with systolic in the 19J and 47W recently.  She is on Lasix, hydralazine, Norvasc.  She is having cognitive and memory issues but otherwise is doing fine. ?Update 08/30/2020 :  She returns for follow-up after last visit 4 months ago.  She is accompanied by her husband.  She states she is doing well.  Her speech has improved and speech therapy plans to  discharge her next week.  She is taking much better.  Her short-term memory is improving but is still not back to normal.  She has good days and bad days.  She has difficulty remembering names.  She is mostly independent in most activities for daily living with her husband does not live alone particularly when she is cooking.  Short-term memory remains poor.  She is tolerating Xarelto well without bleeding or bruising.  Blood pressures well controlled and she has discontinued Lasix and hydralazine.  Blood pressure today is 129/70.  She has no new complaints.  Her IVC filter was removed by Dr. Earleen Newport on 06/27/2020. ?Update 03/14/2021 : She returns for follow-up after last visit 6 months ago.  She is accompanied by her husband.  Patient continues to do well and has not had any recurrent stroke or TIA symptoms.  She remains on Xarelto which is tolerating well without bleeding or bruising.  However she fell at home on 12/15/2020 and suffered 2 compression fractures.  She underwent kyphoplasty and T7 by Dr. Duffy Rhody.  She continues to have mild short-term memory difficulties which appear to be unchanged though she did better on objective testing in the office today.  She has easy distractibility and trouble with multitasking.  She continues to follow-up with Dr. Jana Hakim for a polycythemia which appears to be quite well controlled on hydroxyurea and last hematocrit on 02/21/2021 was 34.9.  She has no new complaints today. ? ?Update 03/14/2022 : She returns for follow-up after last visit a year ago.  She is accompanied by husband.  They feel her memory difficulties are about unchanged.  She continues to have short-term memory difficulties but it is not getting progressive.  She is still mostly independent in most activities of daily living.  Does do crossword puzzles and mentally challenging activities.  I had advised the patient get a follow-up MRI and MRV at last visit but for unclear reason that has not yet been done.   She remains on aspirin which is tolerating well without bruising or bleeding.  Her blood pressures under good control today  it is 129/75.  She is tolerating Lipitor well without muscle aches and pains. ?ROS:   ?14 system review of systems is positive for  memory loss, speech difficulties, headache, confusion and all other systems negative ? ?PMH:  ?Past Medical History:  ?Diagnosis Date  ? Arthritis   ? COPD (chronic obstructive pulmonary disease) ([place])   ? Hepatitis C   ? Hyperlipidemia   ? Hypertension   ? Osteoporosis   ? Stroke [facility])   ? Thrombocytosis   ? ? ?Social History:  ?Social History  ? ?Socioeconomic History  ? Marital status: Married  ?  Spouse name: [name]  ? Number of children: Not on file  ? Years of education: Not on file  ? Highest education level: Not on file  ?Occupational History  ? Occupation: Retired  ?Tobacco Use  ? Smoking status: Former  ?  Types: Cigarettes  ?  Quit date: 2003  ?  Years since quitting: 20.3  ? Smokeless tobacco: Never  ?Vaping Use  ? Vaping Use: Never used  ?Substance and Sexual Activity  ? Alcohol use: Yes  ?  Alcohol/week: 1.0 standard drink  ?  Types: 1 Glasses of  wine per week  ?  Comment: nightly  ? Drug use: No  ? Sexual activity: Not on file  ?Other Topics Concern  ? Not on file  ?Social History Narrative  ? Lives with spouse  ? Right Handed  ? Drinks 2-3 cups of caffeine daily  ? ?Social Determinants of Health  ? ?Financial Resource Strain: Not on file  ?Food Insecurity: Not on file  ?Transportation Needs: Not on file  ?Physical Activity: Not on file  ?Stress: Not on file  ?Social Connections: Not on file  ?Intimate Partner Violence: Not on file  ? ? ?Medications:   ?Current Outpatient Medications on File Prior to Visit  ?Medication Sig Dispense Refill  ? acetaminophen (TYLENOL) 500 MG tablet Take 1,000 mg by mouth every 6 (six) hours as needed for moderate pain or headache.    ? ADVAIR HFA 115-21 MCG/ACT inhaler Inhale 2 puffs into the lungs as needed.    ?  amLODipine (NORVASC) 10 MG tablet Take 10 mg by mouth daily.  30 tablet 0  ? Apoaequorin (PREVAGEN) 10 MG CAPS Take 1 capsule by mouth every morning. 1 capsule 1  ? aspirin EC 81 MG tablet Take 1 tablet (81 mg total

## 2022-03-15 ENCOUNTER — Other Ambulatory Visit: Payer: Self-pay | Admitting: Neurology

## 2022-03-15 DIAGNOSIS — Z86718 Personal history of other venous thrombosis and embolism: Secondary | ICD-10-CM

## 2022-03-15 DIAGNOSIS — G3184 Mild cognitive impairment, so stated: Secondary | ICD-10-CM

## 2022-03-15 DIAGNOSIS — N3946 Mixed incontinence: Secondary | ICD-10-CM | POA: Diagnosis not present

## 2022-03-15 DIAGNOSIS — R350 Frequency of micturition: Secondary | ICD-10-CM | POA: Diagnosis not present

## 2022-03-25 DIAGNOSIS — J441 Chronic obstructive pulmonary disease with (acute) exacerbation: Secondary | ICD-10-CM | POA: Diagnosis not present

## 2022-03-25 DIAGNOSIS — K219 Gastro-esophageal reflux disease without esophagitis: Secondary | ICD-10-CM | POA: Diagnosis not present

## 2022-03-25 DIAGNOSIS — I10 Essential (primary) hypertension: Secondary | ICD-10-CM | POA: Diagnosis not present

## 2022-03-25 DIAGNOSIS — E782 Mixed hyperlipidemia: Secondary | ICD-10-CM | POA: Diagnosis not present

## 2022-03-25 DIAGNOSIS — M810 Age-related osteoporosis without current pathological fracture: Secondary | ICD-10-CM | POA: Diagnosis not present

## 2022-03-25 DIAGNOSIS — E039 Hypothyroidism, unspecified: Secondary | ICD-10-CM | POA: Diagnosis not present

## 2022-03-25 DIAGNOSIS — G8929 Other chronic pain: Secondary | ICD-10-CM | POA: Diagnosis not present

## 2022-03-27 ENCOUNTER — Ambulatory Visit
Admission: RE | Admit: 2022-03-27 | Discharge: 2022-03-27 | Disposition: A | Discharge status: Home / Self Care | Payer: Medicare HMO | Source: Ambulatory Visit | Attending: Neurology | Admitting: Neurology

## 2022-03-27 DIAGNOSIS — Z8673 Personal history of transient ischemic attack (TIA), and cerebral infarction without residual deficits: Secondary | ICD-10-CM | POA: Diagnosis not present

## 2022-03-27 DIAGNOSIS — G08 Intracranial and intraspinal phlebitis and thrombophlebitis: Secondary | ICD-10-CM | POA: Diagnosis not present

## 2022-03-27 DIAGNOSIS — G3184 Mild cognitive impairment, so stated: Secondary | ICD-10-CM

## 2022-03-27 MED ORDER — GADOBENATE DIMEGLUMINE 529 MG/ML IV SOLN
13.0000 mL | Freq: Once | INTRAVENOUS | Status: AC | PRN
  Administered 2022-03-27: 13 mL via INTRAVENOUS

## 2022-04-16 ENCOUNTER — Telehealth: Payer: Self-pay | Admitting: Hematology and Oncology

## 2022-04-16 DIAGNOSIS — H6123 Impacted cerumen, bilateral: Secondary | ICD-10-CM | POA: Diagnosis not present

## 2022-04-16 NOTE — Telephone Encounter (Signed)
Per 6/6 los called and left message about appointment reschedule details and call back number left

## 2022-04-19 DIAGNOSIS — N3946 Mixed incontinence: Secondary | ICD-10-CM | POA: Diagnosis not present

## 2022-05-28 DIAGNOSIS — E039 Hypothyroidism, unspecified: Secondary | ICD-10-CM | POA: Diagnosis not present

## 2022-07-02 DIAGNOSIS — J449 Chronic obstructive pulmonary disease, unspecified: Secondary | ICD-10-CM | POA: Diagnosis not present

## 2022-07-02 DIAGNOSIS — E782 Mixed hyperlipidemia: Secondary | ICD-10-CM | POA: Diagnosis not present

## 2022-07-02 DIAGNOSIS — E039 Hypothyroidism, unspecified: Secondary | ICD-10-CM | POA: Diagnosis not present

## 2022-07-02 DIAGNOSIS — I10 Essential (primary) hypertension: Secondary | ICD-10-CM | POA: Diagnosis not present

## 2022-07-08 DIAGNOSIS — H25013 Cortical age-related cataract, bilateral: Secondary | ICD-10-CM | POA: Diagnosis not present

## 2022-07-08 DIAGNOSIS — H2513 Age-related nuclear cataract, bilateral: Secondary | ICD-10-CM | POA: Diagnosis not present

## 2022-07-08 DIAGNOSIS — H52203 Unspecified astigmatism, bilateral: Secondary | ICD-10-CM | POA: Diagnosis not present

## 2022-08-04 ENCOUNTER — Other Ambulatory Visit: Payer: Self-pay | Admitting: Hematology and Oncology

## 2022-08-04 DIAGNOSIS — D45 Polycythemia vera: Secondary | ICD-10-CM

## 2022-08-04 DIAGNOSIS — D473 Essential (hemorrhagic) thrombocythemia: Secondary | ICD-10-CM

## 2022-08-05 ENCOUNTER — Inpatient Hospital Stay (HOSPITAL_BASED_OUTPATIENT_CLINIC_OR_DEPARTMENT_OTHER): Payer: Medicare HMO | Admitting: Hematology and Oncology

## 2022-08-05 ENCOUNTER — Inpatient Hospital Stay: Payer: Medicare HMO | Attending: Hematology and Oncology

## 2022-08-05 ENCOUNTER — Other Ambulatory Visit: Payer: Self-pay

## 2022-08-05 VITALS — BP 129/73 | HR 80 | Temp 98.1°F | Resp 15 | Wt 146.7 lb

## 2022-08-05 DIAGNOSIS — Z7982 Long term (current) use of aspirin: Secondary | ICD-10-CM | POA: Diagnosis not present

## 2022-08-05 DIAGNOSIS — D45 Polycythemia vera: Secondary | ICD-10-CM | POA: Diagnosis not present

## 2022-08-05 DIAGNOSIS — Z87891 Personal history of nicotine dependence: Secondary | ICD-10-CM | POA: Diagnosis not present

## 2022-08-05 DIAGNOSIS — D75829 Heparin-induced thrombocytopenia, unspecified: Secondary | ICD-10-CM | POA: Diagnosis not present

## 2022-08-05 DIAGNOSIS — D473 Essential (hemorrhagic) thrombocythemia: Secondary | ICD-10-CM

## 2022-08-05 DIAGNOSIS — D689 Coagulation defect, unspecified: Secondary | ICD-10-CM | POA: Diagnosis not present

## 2022-08-05 DIAGNOSIS — Z79899 Other long term (current) drug therapy: Secondary | ICD-10-CM | POA: Diagnosis not present

## 2022-08-05 DIAGNOSIS — I10 Essential (primary) hypertension: Secondary | ICD-10-CM | POA: Insufficient documentation

## 2022-08-05 LAB — CMP (CANCER CENTER ONLY)
ALT: 20 U/L (ref 0–44)
AST: 21 U/L (ref 15–41)
Albumin: 4.4 g/dL (ref 3.5–5.0)
Alkaline Phosphatase: 60 U/L (ref 38–126)
Anion gap: 5 (ref 5–15)
BUN: 13 mg/dL (ref 8–23)
CO2: 28 mmol/L (ref 22–32)
Calcium: 9.7 mg/dL (ref 8.9–10.3)
Chloride: 106 mmol/L (ref 98–111)
Creatinine: 0.87 mg/dL (ref 0.44–1.00)
GFR, Estimated: >60 mL/min (ref >60)
Glucose, Bld: 112 mg/dL — ABNORMAL HIGH (ref 70–99)
Potassium: 3.8 mmol/L (ref 3.5–5.1)
Sodium: 139 mmol/L (ref 135–145)
Total Bilirubin: 0.7 mg/dL (ref 0.3–1.2)
Total Protein: 7.2 g/dL (ref 6.5–8.1)

## 2022-08-05 LAB — CBC WITH DIFFERENTIAL (CANCER CENTER ONLY)
Abs Immature Granulocytes: 0.01 10*3/uL (ref 0.00–0.07)
Basophils Absolute: 0 10*3/uL (ref 0.0–0.1)
Basophils Relative: 1 %
Eosinophils Absolute: 0.1 10*3/uL (ref 0.0–0.5)
Eosinophils Relative: 1 %
HCT: 36.6 % (ref 36.0–46.0)
Hemoglobin: 12.4 g/dL (ref 12.0–15.0)
Immature Granulocytes: 0 %
Lymphocytes Relative: 24 %
Lymphs Abs: 1.3 10*3/uL (ref 0.7–4.0)
MCH: 36.4 pg — ABNORMAL HIGH (ref 26.0–34.0)
MCHC: 33.9 g/dL (ref 30.0–36.0)
MCV: 107.3 fL — ABNORMAL HIGH (ref 80.0–100.0)
Monocytes Absolute: 0.5 10*3/uL (ref 0.1–1.0)
Monocytes Relative: 9 %
Neutro Abs: 3.4 10*3/uL (ref 1.7–7.7)
Neutrophils Relative %: 65 %
Platelet Count: 267 10*3/uL (ref 150–400)
RBC: 3.41 MIL/uL — ABNORMAL LOW (ref 3.87–5.11)
RDW: 12.8 % (ref 11.5–15.5)
WBC Count: 5.2 10*3/uL (ref 4.0–10.5)
nRBC: 0 % (ref 0.0–0.2)

## 2022-08-05 NOTE — Progress Notes (Signed)
[place] [phone]   Fax:([phone]  PROGRESS NOTE  Patient Care Team: Orson Slick, MD as PCP - General (Hematology and Oncology) Buford Dresser, MD as PCP - Cardiology (Cardiology) Arvella Nigh, MD as Consulting Physician (Obstetrics and Gynecology) Garvin Fila, MD as Consulting Physician (Neurology)  Hematological/Oncological History # Essential Thrombocytosis (a) JAK2 mutation positive  (b) bone marrow biopsy 02/13/2020 shows no evidence of leukemia (c)  leukapheresis x3 . Last leukapheresis Friday 02/18/2020   (i) HD catheter removed after pheresis on  02/18/19 (d) hydrea 500 mg daily to keep HCT <45 and platelets in normal range (e) Hydrea dose increased to 1000 mg daily Apr 07, 2020--not tolerated (f) Hydrea resumed at 500 mg daily June 2021 (g) 08/05/2022: transition care to Dr. Lorenso Courier due to Dr. Virgie Dad retirement.   Interval History:  RHEGAN Duke 76 y.o. female with medical history significant for essential thrombocytosis who presents for a follow up visit. The patient's last visit was on 08/08/2021 with Dr. Jana Hakim. In the interim since the last visit she has had no major changes in her health.  On exam today [name] is accompanied by her husband.  She reports that she has been well interim since her last visit.  She reports that she is currently taking hydroxyurea once daily without any difficulties.  Is not causing any stomach upset, ulcers in the mouth, or ankle ulcers.  She also reports she is taking 81 mg p.o. aspirin daily without any difficulty.  She is not having difficulty with headache, vision changes, shortness of breath.  She reports her energy levels are good and her appetite is strong.  She denies any fevers, chills, sweats, nausea, ming or diarrhea.  A full 10 point ROS is listed below.  MEDICAL HISTORY:  Past Medical History:  Diagnosis Date   Arthritis    COPD (chronic obstructive pulmonary  disease) ([place])    Hepatitis C    Hyperlipidemia    Hypertension    Osteoporosis    Stroke ([place])    Thrombocytosis     SURGICAL HISTORY: Past Surgical History:  Procedure Laterality Date   CHOLECYSTECTOMY     IR ANGIO INTRA EXTRACRAN SEL INTERNAL CAROTID BILAT MOD SED  02/24/2020   IR ANGIO VERTEBRAL SEL VERTEBRAL UNI R MOD SED  02/24/2020   IR ANGIOGRAM SELECTIVE EACH ADDITIONAL VESSEL  02/24/2020   IR IVC FILTER PLMT / S&I /IMG GUID/MOD SED  02/22/2020   IR IVC FILTER RETRIEVAL / S&I /IMG GUID/MOD SED  06/27/2020   IR RADIOLOGIST EVAL & MGMT  05/23/2020   IR THROMBECT VENO MECH MOD SED  02/24/2020   IR US GUIDE VASC ACCESS RIGHT  02/24/2020   IR US GUIDE VASC ACCESS RIGHT  02/24/2020   IR VENO SAGITTAL SINUS  02/24/2020   IR VENO/JUGULAR RIGHT  02/24/2020   KYPHOPLASTY N/A 03/01/2021   Procedure: Kyphoplasty Thoracic six, Thoracic seven with spinejack;  Surgeon: Vallarie Mare, MD;  Location: [place];  Service: Neurosurgery;  Laterality: N/A;   RADIOLOGY WITH ANESTHESIA N/A 02/24/2020   Procedure: IR WITH ANESTHESIA;  Surgeon: Radiologist, Medication, MD;  Location: [place];  Service: Radiology;  Laterality: N/A;   TONSILLECTOMY      SOCIAL HISTORY: Social History   Socioeconomic History   Marital status: Married    Spouse name: [name]   Number of children: Not on file   Years of education: Not on file   Highest education level: Not on file  Occupational History   Occupation: Retired  Tobacco Use   Smoking status: Former    Types: Cigarettes    Quit date: 2003    Years since quitting: 20.7   Smokeless tobacco: Never  Vaping Use   Vaping Use: Never used  Substance and Sexual Activity   Alcohol use: Yes    Alcohol/week: 1.0 standard drink of alcohol    Types: 1 Glasses of wine per week    Comment: nightly   Drug use: No   Sexual activity: Not on file  Other Topics Concern   Not on file  Social History Narrative   Lives with spouse   Right Handed   Drinks 2-3 cups of  caffeine daily   Social Determinants of Health   Financial Resource Strain: Not on file  Food Insecurity: Not on file  Transportation Needs: Not on file  Physical Activity: Not on file  Stress: Not on file  Social Connections: Not on file  Intimate Partner Violence: Not on file    FAMILY HISTORY: Family History  Problem Relation Age of Onset   Heart disease Mother    Heart disease Father     ALLERGIES:  is allergic to heparin and erythromycin.  MEDICATIONS:  Current Outpatient Medications  Medication Sig Dispense Refill   clobetasol cream (TEMOVATE) 0.05 % APPLY A THIN LAYER TO THE AFFECTED AREA(S) TOPICALLY TWICE DAILY     acetaminophen (TYLENOL) 500 MG tablet Take 1,000 mg by mouth every 6 (six) hours as needed for moderate pain or headache.     ADVAIR HFA 115-21 MCG/ACT inhaler Inhale 2 puffs into the lungs as needed.     amLODipine (NORVASC) 10 MG tablet Take 10 mg by mouth daily.  30 tablet 0   aspirin EC 81 MG tablet Take 1 tablet (81 mg total) by mouth daily. Swallow whole. 30 tablet 11   atorvastatin (LIPITOR) 20 MG tablet Take 1 tablet by mouth daily.     Cyanocobalamin (B-12) 5000 MCG CAPS Take 5,000 mcg by mouth daily.     docusate sodium (COLACE) 100 MG capsule Take 100 mg by mouth daily.     hydroxyurea (HYDREA) 500 MG capsule Take 1 capsule (500 mg total) by mouth daily. May take with food to minimize GI side effects. 120 capsule 6   levothyroxine (SYNTHROID) 125 MCG tablet Take 125 mcg by mouth every morning.     losartan (COZAAR) 50 MG tablet Take 1 tablet (50 mg total) by mouth 2 (two) times daily. 60 tablet 0   pantoprazole (PROTONIX) 20 MG tablet Take 20 mg by mouth daily.     solifenacin (VESICARE) 5 MG tablet Take 5 mg by mouth daily.     No current facility-administered medications for this visit.    REVIEW OF SYSTEMS:   Constitutional: ( - ) fevers, ( - )  chills , ( - ) night sweats Eyes: ( - ) blurriness of vision, ( - ) double vision, ( - ) watery  eyes Ears, nose, mouth, throat, and face: ( - ) mucositis, ( - ) sore throat Respiratory: ( - ) cough, ( - ) dyspnea, ( - ) wheezes Cardiovascular: ( - ) palpitation, ( - ) chest discomfort, ( - ) lower extremity swelling Gastrointestinal:  ( - ) nausea, ( - ) heartburn, ( - ) change in bowel habits Skin: ( - ) abnormal skin rashes Lymphatics: ( - ) new lymphadenopathy, ( - ) easy bruising Neurological: ( - ) numbness, ( - )  tingling, ( - ) new weaknesses Behavioral/Psych: ( - ) mood change, ( - ) new changes  All other systems were reviewed with the patient and are negative.  PHYSICAL EXAMINATION: ECOG PERFORMANCE STATUS: 0 - Asymptomatic  Vitals:   08/05/22 1117  BP: 129/73  Pulse: 80  Resp: 15  Temp: 98.1 F (36.7 C)  SpO2: 97%   Filed Weights   08/05/22 1117  Weight: 146 lb 11.2 oz (66.5 kg)    GENERAL: Well-appearing elderly Caucasian female, alert, no distress and comfortable SKIN: skin color, texture, turgor are normal, no rashes or significant lesions EYES: conjunctiva are pink and non-injected, sclera clear LUNGS: clear to auscultation and percussion with normal breathing effort HEART: regular rate & rhythm and no murmurs and no lower extremity edema Musculoskeletal: no cyanosis of digits and no clubbing  PSYCH: alert & oriented x 3, fluent speech NEURO: no focal motor/sensory deficits  LABORATORY DATA:  I have reviewed the data as listed    Latest Ref Rng & Units 08/05/2022   10:56 AM 08/08/2021    1:03 PM 05/16/2021   12:15 PM  CBC  WBC 4.0 - 10.5 K/uL 5.2  4.9  4.9   Hemoglobin 12.0 - 15.0 g/dL 12.4  12.0  11.9   Hematocrit 36.0 - 46.0 % 36.6  35.5  36.0   Platelets 150 - 400 K/uL 267  211  226        Latest Ref Rng & Units 08/05/2022   10:56 AM 08/08/2021    1:03 PM 05/16/2021   12:15 PM  CMP  Glucose 70 - 99 mg/dL 112  112  109   BUN 8 - 23 mg/dL _0 Creatinine 0.44 - 1.00 mg/dL 0.87  0.93  0.93   Sodium 135 - 145 mmol/L 139  143  144    Potassium 3.5 - 5.1 mmol/L 3.8  3.8  3.9   Chloride 98 - 111 mmol/L 106  108  110   CO2 22 - 32 mmol/L _1 Calcium 8.9 - 10.3 mg/dL 9.7  10.1  10.3   Total Protein 6.5 - 8.1 g/dL 7.2  7.2  7.3   Total Bilirubin 0.3 - 1.2 mg/dL 0.7  0.8  0.9   Alkaline Phos 38 - 126 U/L 60  86  91   AST 15 - 41 U/L _2 ALT 0 - 44 U/L _3 RADIOGRAPHIC STUDIES: No results found.  ASSESSMENT & PLAN LEATA DOMINY 76 y.o. female with medical history significant for essential thrombocytosis who presents for a follow up visit.  # Essential Thrombocytosis, JAK2 Positive  -- Continue hydroxyurea 500 mg p.o. daily as well as aspirin 81 mg p.o. daily -- Labs today show white blood cell count 5.2, hemoglobin 12.4, and platelets of 267.  Currently blood counts are on target. -- Plan to maintain platelets between 150 and 400 -- Close monitoring for progression to myelofibrosis or acute leukemia. -- Return to clinic in 6 months time with interval 20-monthlabs.  # Heparin Induced Thrombocytopenia --avoid use of heparin and heparin products --recommend call to oncology if alterative anticoagulation  No orders of the defined types were placed in this encounter.   All questions were answered. The patient knows to call the clinic with any problems, questions or concerns.  A total of more than 40 minutes were spent on this encounter  with face-to-face time and non-face-to-face time, including preparing to see the patient, ordering tests and/or medications, counseling the patient and coordination of care as outlined above.   Ledell Peoples, MD Department of Hematology/Oncology [place] at [facility] Phone: [phone] Pager: [phone] Email: [email or web address]  08/05/2022 4:30 PM

## 2022-08-08 ENCOUNTER — Ambulatory Visit: Payer: Medicare HMO | Admitting: Hematology and Oncology

## 2022-08-08 ENCOUNTER — Other Ambulatory Visit: Payer: Medicare HMO

## 2022-08-13 DIAGNOSIS — E039 Hypothyroidism, unspecified: Secondary | ICD-10-CM | POA: Diagnosis not present

## 2022-08-13 DIAGNOSIS — R7303 Prediabetes: Secondary | ICD-10-CM | POA: Diagnosis not present

## 2022-08-13 DIAGNOSIS — R7309 Other abnormal glucose: Secondary | ICD-10-CM | POA: Diagnosis not present

## 2022-08-13 DIAGNOSIS — Z79899 Other long term (current) drug therapy: Secondary | ICD-10-CM | POA: Diagnosis not present

## 2022-08-20 DIAGNOSIS — K219 Gastro-esophageal reflux disease without esophagitis: Secondary | ICD-10-CM | POA: Diagnosis not present

## 2022-08-20 DIAGNOSIS — E039 Hypothyroidism, unspecified: Secondary | ICD-10-CM | POA: Diagnosis not present

## 2022-08-20 DIAGNOSIS — R7309 Other abnormal glucose: Secondary | ICD-10-CM | POA: Diagnosis not present

## 2022-08-20 DIAGNOSIS — Z23 Encounter for immunization: Secondary | ICD-10-CM | POA: Diagnosis not present

## 2022-08-20 DIAGNOSIS — Z6827 Body mass index (BMI) 27.0-27.9, adult: Secondary | ICD-10-CM | POA: Diagnosis not present

## 2022-08-20 DIAGNOSIS — N3281 Overactive bladder: Secondary | ICD-10-CM | POA: Diagnosis not present

## 2022-08-20 DIAGNOSIS — E782 Mixed hyperlipidemia: Secondary | ICD-10-CM | POA: Diagnosis not present

## 2022-08-20 DIAGNOSIS — N3946 Mixed incontinence: Secondary | ICD-10-CM | POA: Diagnosis not present

## 2022-08-20 DIAGNOSIS — D473 Essential (hemorrhagic) thrombocythemia: Secondary | ICD-10-CM | POA: Diagnosis not present

## 2022-08-20 DIAGNOSIS — I10 Essential (primary) hypertension: Secondary | ICD-10-CM | POA: Diagnosis not present

## 2022-08-20 DIAGNOSIS — J449 Chronic obstructive pulmonary disease, unspecified: Secondary | ICD-10-CM | POA: Diagnosis not present

## 2022-09-04 ENCOUNTER — Other Ambulatory Visit: Payer: Self-pay | Admitting: *Deleted

## 2022-09-04 MED ORDER — HYDROXYUREA 500 MG PO CAPS: 500.0000 mg | ORAL_CAPSULE | Freq: Every day | ORAL | 3 refills | Status: DC

## 2022-09-25 DIAGNOSIS — Z6828 Body mass index (BMI) 28.0-28.9, adult: Secondary | ICD-10-CM | POA: Diagnosis not present

## 2022-09-25 DIAGNOSIS — Z01419 Encounter for gynecological examination (general) (routine) without abnormal findings: Secondary | ICD-10-CM | POA: Diagnosis not present

## 2022-09-25 DIAGNOSIS — Z1231 Encounter for screening mammogram for malignant neoplasm of breast: Secondary | ICD-10-CM | POA: Diagnosis not present

## 2022-10-09 DIAGNOSIS — N3946 Mixed incontinence: Secondary | ICD-10-CM | POA: Diagnosis not present

## 2022-11-06 ENCOUNTER — Inpatient Hospital Stay: Payer: Medicare HMO

## 2022-11-07 ENCOUNTER — Other Ambulatory Visit: Payer: Self-pay | Admitting: Hematology and Oncology

## 2022-11-07 DIAGNOSIS — D473 Essential (hemorrhagic) thrombocythemia: Secondary | ICD-10-CM

## 2022-11-12 ENCOUNTER — Other Ambulatory Visit: Payer: Self-pay

## 2022-11-12 ENCOUNTER — Inpatient Hospital Stay: Payer: Medicare HMO | Attending: Hematology and Oncology

## 2022-11-12 DIAGNOSIS — Z79899 Other long term (current) drug therapy: Secondary | ICD-10-CM | POA: Diagnosis not present

## 2022-11-12 DIAGNOSIS — D473 Essential (hemorrhagic) thrombocythemia: Secondary | ICD-10-CM | POA: Diagnosis not present

## 2022-11-12 LAB — CMP (CANCER CENTER ONLY)
ALT: 13 U/L (ref 0–44)
AST: 15 U/L (ref 15–41)
Albumin: 4 g/dL (ref 3.5–5.0)
Alkaline Phosphatase: 57 U/L (ref 38–126)
Anion gap: 6 (ref 5–15)
BUN: 13 mg/dL (ref 8–23)
CO2: 28 mmol/L (ref 22–32)
Calcium: 9.7 mg/dL (ref 8.9–10.3)
Chloride: 106 mmol/L (ref 98–111)
Creatinine: 0.76 mg/dL (ref 0.44–1.00)
GFR, Estimated: >60 mL/min (ref >60)
Glucose, Bld: 106 mg/dL — ABNORMAL HIGH (ref 70–99)
Potassium: 3.9 mmol/L (ref 3.5–5.1)
Sodium: 140 mmol/L (ref 135–145)
Total Bilirubin: 0.6 mg/dL (ref 0.3–1.2)
Total Protein: 7.1 g/dL (ref 6.5–8.1)

## 2022-11-12 LAB — CBC WITH DIFFERENTIAL (CANCER CENTER ONLY)
Abs Immature Granulocytes: 0.02 10*3/uL (ref 0.00–0.07)
Basophils Absolute: 0 10*3/uL (ref 0.0–0.1)
Basophils Relative: 0 %
Eosinophils Absolute: 0.1 10*3/uL (ref 0.0–0.5)
Eosinophils Relative: 1 %
HCT: 36.4 % (ref 36.0–46.0)
Hemoglobin: 12.5 g/dL (ref 12.0–15.0)
Immature Granulocytes: 0 %
Lymphocytes Relative: 29 %
Lymphs Abs: 1.7 10*3/uL (ref 0.7–4.0)
MCH: 36.7 pg — ABNORMAL HIGH (ref 26.0–34.0)
MCHC: 34.3 g/dL (ref 30.0–36.0)
MCV: 106.7 fL — ABNORMAL HIGH (ref 80.0–100.0)
Monocytes Absolute: 0.5 10*3/uL (ref 0.1–1.0)
Monocytes Relative: 9 %
Neutro Abs: 3.5 10*3/uL (ref 1.7–7.7)
Neutrophils Relative %: 61 %
Platelet Count: 281 10*3/uL (ref 150–400)
RBC: 3.41 MIL/uL — ABNORMAL LOW (ref 3.87–5.11)
RDW: 12.3 % (ref 11.5–15.5)
WBC Count: 5.8 10*3/uL (ref 4.0–10.5)
nRBC: 0 % (ref 0.0–0.2)

## 2022-11-26 ENCOUNTER — Telehealth: Payer: Self-pay

## 2022-11-26 NOTE — Telephone Encounter (Signed)
Patient is having a "peripheral nerve evaluation- surgery" on 12/03/22. During her pre-op appointment they requested that she check with Dr. Lorenso Courier to see if she should stop taking her hydroxyurea prior to the procedure.  Routed to Dr. Lorenso Courier.

## 2022-12-03 DIAGNOSIS — N3941 Urge incontinence: Secondary | ICD-10-CM | POA: Diagnosis not present

## 2022-12-11 DIAGNOSIS — N3946 Mixed incontinence: Secondary | ICD-10-CM | POA: Diagnosis not present

## 2022-12-11 DIAGNOSIS — R350 Frequency of micturition: Secondary | ICD-10-CM | POA: Diagnosis not present

## 2022-12-11 DIAGNOSIS — R351 Nocturia: Secondary | ICD-10-CM | POA: Diagnosis not present

## 2022-12-17 DIAGNOSIS — N3941 Urge incontinence: Secondary | ICD-10-CM | POA: Diagnosis not present

## 2022-12-17 HISTORY — PX: OTHER SURGICAL HISTORY: SHX169

## 2022-12-27 DIAGNOSIS — N3946 Mixed incontinence: Secondary | ICD-10-CM | POA: Diagnosis not present

## 2022-12-27 DIAGNOSIS — R350 Frequency of micturition: Secondary | ICD-10-CM | POA: Diagnosis not present

## 2023-01-09 DIAGNOSIS — N3946 Mixed incontinence: Secondary | ICD-10-CM | POA: Diagnosis not present

## 2023-02-03 NOTE — Progress Notes (Unsigned)
[place] Telephone:(336) [phone]   Fax:([phone]  PROGRESS NOTE  Patient Care Team: Orson Slick, MD as PCP - General (Hematology and Oncology) Buford Dresser, MD as PCP - Cardiology (Cardiology) Arvella Nigh, MD as Consulting Physician (Obstetrics and Gynecology) Garvin Fila, MD as Consulting Physician (Neurology)  Hematological/Oncological History # Essential Thrombocytosis (a) JAK2 mutation positive  (b) bone marrow biopsy 02/13/2020 shows no evidence of leukemia (c)  leukapheresis x3 . Last leukapheresis Friday 02/18/2020   (i) HD catheter removed after pheresis on  02/18/19 (d) hydrea 500 mg daily to keep HCT <45 and platelets in normal range (e) Hydrea dose increased to 1000 mg daily Apr 07, 2020--not tolerated (f) Hydrea resumed at 500 mg daily June 2021 (g) 08/05/2022: transition care to Dr. Lorenso Courier due to Dr. Virgie Dad retirement.   Interval History:  [name] 77 y.o. female with medical history significant for essential thrombocytosis who presents for a follow up visit. The patient's last visit was on 08/05/2022. In the interim since the last visit she has had no major changes in her health.  On exam today [name] is accompanied by her husband.  She reports she is faithfully taking her.  500 mg p.o. daily as well as her baby aspirin 81 mg p.o. daily.  She reports that her health has been fine overall interim since her last visit.  She recently had a device implanted for bladder control.  She reports that it is from [place] and she is currently working on adjusting the device.  She reports that the hydroxyurea medication is not causing any side effect such as mouth ulcers, ankle ulcers, or stomach upset.  She does bruise easily but is not having any bleeding, or bright red blood per rectum or in the urine.  She notes that she does have some chronic shoulder pain for which she would like pain relief.  She is currently taking  Tylenol but is interested in taking some ibuprofen as well.  Overall her energy levels are strong.  She denies any fevers, chills, sweats, nausea, ming or diarrhea.  A full 10 point ROS is listed below.  MEDICAL HISTORY:  Past Medical History:  Diagnosis Date   Arthritis    COPD (chronic obstructive pulmonary disease) ([place])    Hepatitis C    Hyperlipidemia    Hypertension    Osteoporosis    Stroke ([place])    Thrombocytosis     SURGICAL HISTORY: Past Surgical History:  Procedure Laterality Date   CHOLECYSTECTOMY     IR ANGIO INTRA EXTRACRAN SEL INTERNAL CAROTID BILAT MOD SED  02/24/2020   IR ANGIO VERTEBRAL SEL VERTEBRAL UNI R MOD SED  02/24/2020   IR ANGIOGRAM SELECTIVE EACH ADDITIONAL VESSEL  02/24/2020   IR IVC FILTER PLMT / S&I /IMG GUID/MOD SED  02/22/2020   IR IVC FILTER RETRIEVAL / S&I /IMG GUID/MOD SED  06/27/2020   IR RADIOLOGIST EVAL & MGMT  05/23/2020   IR THROMBECT VENO MECH MOD SED  02/24/2020   IR US GUIDE VASC ACCESS RIGHT  02/24/2020   IR US GUIDE VASC ACCESS RIGHT  02/24/2020   IR VENO SAGITTAL SINUS  02/24/2020   IR VENO/JUGULAR RIGHT  02/24/2020   KYPHOPLASTY N/A 03/01/2021   Procedure: Kyphoplasty Thoracic six, Thoracic seven with spinejack;  Surgeon: Vallarie Mare, MD;  Location: [place];  Service: Neurosurgery;  Laterality: N/A;   RADIOLOGY WITH ANESTHESIA N/A 02/24/2020   Procedure: IR WITH ANESTHESIA;  Surgeon: Radiologist,  Medication, MD;  Location: [place];  Service: Radiology;  Laterality: N/A;   TONSILLECTOMY      SOCIAL HISTORY: Social History   Socioeconomic History   Marital status: Married    Spouse name: [name]   Number of children: Not on file   Years of education: Not on file   Highest education level: Not on file  Occupational History   Occupation: Retired  Tobacco Use   Smoking status: Former    Types: Cigarettes    Quit date: 2003    Years since quitting: 21.2   Smokeless tobacco: Never  Vaping Use   Vaping Use: Never used  Substance and  Sexual Activity   Alcohol use: Yes    Alcohol/week: 1.0 standard drink of alcohol    Types: 1 Glasses of wine per week    Comment: nightly   Drug use: No   Sexual activity: Not on file  Other Topics Concern   Not on file  Social History Narrative   Lives with spouse   Right Handed   Drinks 2-3 cups of caffeine daily   Social Determinants of Health   Financial Resource Strain: Not on file  Food Insecurity: Not on file  Transportation Needs: Not on file  Physical Activity: Not on file  Stress: Not on file  Social Connections: Not on file  Intimate Partner Violence: Not on file    FAMILY HISTORY: Family History  Problem Relation Age of Onset   Heart disease Mother    Heart disease Father     ALLERGIES:  is allergic to heparin and erythromycin.  MEDICATIONS:  Current Outpatient Medications  Medication Sig Dispense Refill   acetaminophen (TYLENOL) 500 MG tablet Take 1,000 mg by mouth every 6 (six) hours as needed for moderate pain or headache.     ADVAIR HFA 115-21 MCG/ACT inhaler Inhale 2 puffs into the lungs as needed.     amLODipine (NORVASC) 10 MG tablet Take 10 mg by mouth daily.  30 tablet 0   aspirin EC 81 MG tablet Take 1 tablet (81 mg total) by mouth daily. Swallow whole. 30 tablet 11   atorvastatin (LIPITOR) 20 MG tablet Take 1 tablet by mouth daily.     clobetasol cream (TEMOVATE) 0.05 % APPLY A THIN LAYER TO THE AFFECTED AREA(S) TOPICALLY TWICE DAILY     Cyanocobalamin (B-12) 5000 MCG CAPS Take 5,000 mcg by mouth daily.     docusate sodium (COLACE) 100 MG capsule Take 100 mg by mouth daily.     hydroxyurea (HYDREA) 500 MG capsule Take 1 capsule (500 mg total) by mouth daily. May take with food to minimize GI side effects. 90 capsule 3   levothyroxine (SYNTHROID) 125 MCG tablet Take 125 mcg by mouth every morning.     losartan (COZAAR) 50 MG tablet Take 1 tablet (50 mg total) by mouth 2 (two) times daily. 60 tablet 0   pantoprazole (PROTONIX) 20 MG tablet Take 20  mg by mouth daily.     solifenacin (VESICARE) 5 MG tablet Take 5 mg by mouth daily. (Patient not taking: Reported on 02/04/2023)     No current facility-administered medications for this visit.    REVIEW OF SYSTEMS:   Constitutional: ( - ) fevers, ( - )  chills , ( - ) night sweats Eyes: ( - ) blurriness of vision, ( - ) double vision, ( - ) watery eyes Ears, nose, mouth, throat, and face: ( - ) mucositis, ( - ) sore throat Respiratory: ( - )  cough, ( - ) dyspnea, ( - ) wheezes Cardiovascular: ( - ) palpitation, ( - ) chest discomfort, ( - ) lower extremity swelling Gastrointestinal:  ( - ) nausea, ( - ) heartburn, ( - ) change in bowel habits Skin: ( - ) abnormal skin rashes Lymphatics: ( - ) new lymphadenopathy, ( - ) easy bruising Neurological: ( - ) numbness, ( - ) tingling, ( - ) new weaknesses Behavioral/Psych: ( - ) mood change, ( - ) new changes  All other systems were reviewed with the patient and are negative.  PHYSICAL EXAMINATION: ECOG PERFORMANCE STATUS: 0 - Asymptomatic  Vitals:   02/04/23 1058  BP: 127/70  Pulse: 68  Resp: 18  Temp: 98.3 F (36.8 C)  SpO2: 98%    Filed Weights   02/04/23 1058  Weight: 155 lb 12.8 oz (70.7 kg)     GENERAL: Well-appearing elderly Caucasian female, alert, no distress and comfortable SKIN: skin color, texture, turgor are normal, no rashes or significant lesions EYES: conjunctiva are pink and non-injected, sclera clear LUNGS: clear to auscultation and percussion with normal breathing effort HEART: regular rate & rhythm and no murmurs and no lower extremity edema Musculoskeletal: no cyanosis of digits and no clubbing  PSYCH: alert & oriented x 3, fluent speech NEURO: no focal motor/sensory deficits  LABORATORY DATA:  I have reviewed the data as listed    Latest Ref Rng & Units 02/04/2023   10:21 AM 11/12/2022   11:29 AM 08/05/2022   10:56 AM  CBC  WBC 4.0 - 10.5 K/uL 5.5  5.8  5.2   Hemoglobin 12.0 - 15.0 g/dL 13.0  12.5   12.4   Hematocrit 36.0 - 46.0 % 38.8  36.4  36.6   Platelets 150 - 400 K/uL 268  281  267        Latest Ref Rng & Units 02/04/2023   10:21 AM 11/12/2022   11:29 AM 08/05/2022   10:56 AM  CMP  Glucose 70 - 99 mg/dL 107  106  112   BUN 8 - 23 mg/dL 14  13  13    Creatinine 0.44 - 1.00 mg/dL 0.91  0.76  0.87   Sodium 135 - 145 mmol/L 141  140  139   Potassium 3.5 - 5.1 mmol/L 3.9  3.9  3.8   Chloride 98 - 111 mmol/L 106  106  106   CO2 22 - 32 mmol/L 29  28  28    Calcium 8.9 - 10.3 mg/dL 9.8  9.7  9.7   Total Protein 6.5 - 8.1 g/dL 7.1  7.1  7.2   Total Bilirubin 0.3 - 1.2 mg/dL 0.7  0.6  0.7   Alkaline Phos 38 - 126 U/L 69  57  60   AST 15 - 41 U/L 18  15  21    ALT 0 - 44 U/L 18  13  20      RADIOGRAPHIC STUDIES: No results found.  ASSESSMENT & PLAN [name] 77 y.o. female with medical history significant for essential thrombocytosis who presents for a follow up visit.  # Essential Thrombocytosis, JAK2 Positive  -- Continue hydroxyurea 500 mg p.o. daily as well as aspirin 81 mg p.o. daily -- Labs today show white blood cell count 5.5, hemoglobin 13.0, MCV 108.4, and platelets of 268.  Currently blood counts are on target. -- Plan to maintain platelets between 150 and 400 -- Close monitoring for progression to myelofibrosis or acute leukemia. -- Return to clinic in  6 months time with interval 38-month labs.  # Heparin Induced Thrombocytopenia --avoid use of heparin and heparin products --recommend call to oncology if alterative anticoagulation  No orders of the defined types were placed in this encounter.   All questions were answered. The patient knows to call the clinic with any problems, questions or concerns.  A total of more than 30 minutes were spent on this encounter with face-to-face time and non-face-to-face time, including preparing to see the patient, ordering tests and/or medications, counseling the patient and coordination of care as outlined above.   Ledell Peoples, MD Department of Hematology/Oncology [place] at [facility] Phone: [phone] Pager: [phone] Email: [email or web address]  02/04/2023 11:55 AM

## 2023-02-04 ENCOUNTER — Other Ambulatory Visit: Payer: Self-pay

## 2023-02-04 ENCOUNTER — Inpatient Hospital Stay: Payer: Medicare HMO | Attending: Hematology and Oncology

## 2023-02-04 ENCOUNTER — Inpatient Hospital Stay: Payer: Medicare HMO | Admitting: Hematology and Oncology

## 2023-02-04 VITALS — BP 127/70 | HR 68 | Temp 98.3°F | Resp 18 | Ht 62.0 in | Wt 155.8 lb

## 2023-02-04 DIAGNOSIS — D473 Essential (hemorrhagic) thrombocythemia: Secondary | ICD-10-CM | POA: Insufficient documentation

## 2023-02-04 DIAGNOSIS — M25519 Pain in unspecified shoulder: Secondary | ICD-10-CM | POA: Insufficient documentation

## 2023-02-04 DIAGNOSIS — D75829 Heparin-induced thrombocytopenia, unspecified: Secondary | ICD-10-CM | POA: Diagnosis not present

## 2023-02-04 DIAGNOSIS — Z7982 Long term (current) use of aspirin: Secondary | ICD-10-CM | POA: Diagnosis not present

## 2023-02-04 DIAGNOSIS — M810 Age-related osteoporosis without current pathological fracture: Secondary | ICD-10-CM | POA: Diagnosis not present

## 2023-02-04 DIAGNOSIS — Z79890 Hormone replacement therapy (postmenopausal): Secondary | ICD-10-CM | POA: Diagnosis not present

## 2023-02-04 DIAGNOSIS — Z87891 Personal history of nicotine dependence: Secondary | ICD-10-CM | POA: Insufficient documentation

## 2023-02-04 DIAGNOSIS — Z79899 Other long term (current) drug therapy: Secondary | ICD-10-CM | POA: Diagnosis not present

## 2023-02-04 DIAGNOSIS — J449 Chronic obstructive pulmonary disease, unspecified: Secondary | ICD-10-CM | POA: Diagnosis not present

## 2023-02-04 DIAGNOSIS — Z8673 Personal history of transient ischemic attack (TIA), and cerebral infarction without residual deficits: Secondary | ICD-10-CM | POA: Insufficient documentation

## 2023-02-04 DIAGNOSIS — E785 Hyperlipidemia, unspecified: Secondary | ICD-10-CM | POA: Insufficient documentation

## 2023-02-04 DIAGNOSIS — I10 Essential (primary) hypertension: Secondary | ICD-10-CM | POA: Diagnosis not present

## 2023-02-04 LAB — CMP (CANCER CENTER ONLY)
ALT: 18 U/L (ref 0–44)
AST: 18 U/L (ref 15–41)
Albumin: 4.5 g/dL (ref 3.5–5.0)
Alkaline Phosphatase: 69 U/L (ref 38–126)
Anion gap: 6 (ref 5–15)
BUN: 14 mg/dL (ref 8–23)
CO2: 29 mmol/L (ref 22–32)
Calcium: 9.8 mg/dL (ref 8.9–10.3)
Chloride: 106 mmol/L (ref 98–111)
Creatinine: 0.91 mg/dL (ref 0.44–1.00)
GFR, Estimated: >60 mL/min (ref >60)
Glucose, Bld: 107 mg/dL — ABNORMAL HIGH (ref 70–99)
Potassium: 3.9 mmol/L (ref 3.5–5.1)
Sodium: 141 mmol/L (ref 135–145)
Total Bilirubin: 0.7 mg/dL (ref 0.3–1.2)
Total Protein: 7.1 g/dL (ref 6.5–8.1)

## 2023-02-04 LAB — CBC WITH DIFFERENTIAL (CANCER CENTER ONLY)
Abs Immature Granulocytes: 0.02 10*3/uL (ref 0.00–0.07)
Basophils Absolute: 0 10*3/uL (ref 0.0–0.1)
Basophils Relative: 1 %
Eosinophils Absolute: 0.1 10*3/uL (ref 0.0–0.5)
Eosinophils Relative: 1 %
HCT: 38.8 % (ref 36.0–46.0)
Hemoglobin: 13 g/dL (ref 12.0–15.0)
Immature Granulocytes: 0 %
Lymphocytes Relative: 27 %
Lymphs Abs: 1.5 10*3/uL (ref 0.7–4.0)
MCH: 36.3 pg — ABNORMAL HIGH (ref 26.0–34.0)
MCHC: 33.5 g/dL (ref 30.0–36.0)
MCV: 108.4 fL — ABNORMAL HIGH (ref 80.0–100.0)
Monocytes Absolute: 0.5 10*3/uL (ref 0.1–1.0)
Monocytes Relative: 10 %
Neutro Abs: 3.3 10*3/uL (ref 1.7–7.7)
Neutrophils Relative %: 61 %
Platelet Count: 268 10*3/uL (ref 150–400)
RBC: 3.58 MIL/uL — ABNORMAL LOW (ref 3.87–5.11)
RDW: 13.4 % (ref 11.5–15.5)
WBC Count: 5.5 10*3/uL (ref 4.0–10.5)
nRBC: 0 % (ref 0.0–0.2)

## 2023-02-17 DIAGNOSIS — E039 Hypothyroidism, unspecified: Secondary | ICD-10-CM | POA: Diagnosis not present

## 2023-02-17 DIAGNOSIS — R7303 Prediabetes: Secondary | ICD-10-CM | POA: Diagnosis not present

## 2023-02-17 DIAGNOSIS — R7309 Other abnormal glucose: Secondary | ICD-10-CM | POA: Diagnosis not present

## 2023-02-17 DIAGNOSIS — E782 Mixed hyperlipidemia: Secondary | ICD-10-CM | POA: Diagnosis not present

## 2023-02-19 DIAGNOSIS — N3946 Mixed incontinence: Secondary | ICD-10-CM | POA: Diagnosis not present

## 2023-02-20 DIAGNOSIS — D6869 Other thrombophilia: Secondary | ICD-10-CM | POA: Diagnosis not present

## 2023-02-20 DIAGNOSIS — E559 Vitamin D deficiency, unspecified: Secondary | ICD-10-CM | POA: Diagnosis not present

## 2023-02-20 DIAGNOSIS — E782 Mixed hyperlipidemia: Secondary | ICD-10-CM | POA: Diagnosis not present

## 2023-02-20 DIAGNOSIS — R7309 Other abnormal glucose: Secondary | ICD-10-CM | POA: Diagnosis not present

## 2023-02-20 DIAGNOSIS — I700 Atherosclerosis of aorta: Secondary | ICD-10-CM | POA: Diagnosis not present

## 2023-02-20 DIAGNOSIS — J449 Chronic obstructive pulmonary disease, unspecified: Secondary | ICD-10-CM | POA: Diagnosis not present

## 2023-02-20 DIAGNOSIS — M810 Age-related osteoporosis without current pathological fracture: Secondary | ICD-10-CM | POA: Diagnosis not present

## 2023-02-20 DIAGNOSIS — I10 Essential (primary) hypertension: Secondary | ICD-10-CM | POA: Diagnosis not present

## 2023-02-20 DIAGNOSIS — M75102 Unspecified rotator cuff tear or rupture of left shoulder, not specified as traumatic: Secondary | ICD-10-CM | POA: Diagnosis not present

## 2023-02-20 DIAGNOSIS — D473 Essential (hemorrhagic) thrombocythemia: Secondary | ICD-10-CM | POA: Diagnosis not present

## 2023-02-20 DIAGNOSIS — K219 Gastro-esophageal reflux disease without esophagitis: Secondary | ICD-10-CM | POA: Diagnosis not present

## 2023-02-24 DIAGNOSIS — M810 Age-related osteoporosis without current pathological fracture: Secondary | ICD-10-CM | POA: Diagnosis not present

## 2023-03-04 DIAGNOSIS — M25512 Pain in left shoulder: Secondary | ICD-10-CM | POA: Diagnosis not present

## 2023-03-06 DIAGNOSIS — Z0000 Encounter for general adult medical examination without abnormal findings: Secondary | ICD-10-CM | POA: Diagnosis not present

## 2023-03-06 DIAGNOSIS — Z1389 Encounter for screening for other disorder: Secondary | ICD-10-CM | POA: Diagnosis not present

## 2023-03-20 DIAGNOSIS — M25512 Pain in left shoulder: Secondary | ICD-10-CM | POA: Diagnosis not present

## 2023-03-25 DIAGNOSIS — M25512 Pain in left shoulder: Secondary | ICD-10-CM | POA: Diagnosis not present

## 2023-03-27 ENCOUNTER — Ambulatory Visit: Payer: Medicare HMO | Admitting: Neurology

## 2023-03-27 VITALS — BP 114/70 | HR 74 | Ht 62.0 in | Wt 156.0 lb

## 2023-03-27 DIAGNOSIS — G3184 Mild cognitive impairment, so stated: Secondary | ICD-10-CM | POA: Diagnosis not present

## 2023-03-27 DIAGNOSIS — R413 Other amnesia: Secondary | ICD-10-CM

## 2023-03-27 NOTE — Patient Instructions (Addendum)
I had a long discussion with the patient and her husband regarding her history of remote cerebral venous sinus thrombosis and she is done extremely well. Continue aspirin 81 mg daily for stroke prevention and maintain aggressive risk factor modification.     She also has mild cognitive impairment but is otherwise doing extremely well.  I recommend she increase participation in mentally challenging activities like solving crossword puzzles, playing bridge and sodoku. .  We also discussed memory compensation strategies.  She will return for follow-up with me in the future in a year or call earlier if necessary.  Memory Compensation Strategies  Use "WARM" strategy.  W= write it down  A= associate it  R= repeat it  M= make a mental note  2.   You can keep a Glass blower/designer.  Use a 3-ring notebook with sections for the following: calendar, important names and phone numbers,  medications, doctors' names/phone numbers, lists/reminders, and a section to journal what you did  each day.   3.    Use a calendar to write appointments down.  4.    Write yourself a schedule for the day.  This can be placed on the calendar or in a separate section of the Memory Notebook.  Keeping a  regular schedule can help memory.  5.    Use medication organizer with sections for each day or morning/evening pills.  You may need help loading it  6.    Keep a basket, or pegboard by the door.  Place items that you need to take out with you in the basket or on the pegboard.  You may also want to  include a message board for reminders.  7.    Use sticky notes.  Place sticky notes with reminders in a place where the task is performed.  For example: " turn off the  stove" placed by the stove, "lock the door" placed on the door at eye level, " take your medications" on  the bathroom mirror or by the place where you normally take your medications.  8.    Use alarms/timers.  Use while cooking to remind yourself to check on food  or as a reminder to take your medicine, or as a  reminder to make a call, or as a reminder to perform another task, etc.

## 2023-03-27 NOTE — Progress Notes (Signed)
[facility] [address] [place]. [place] [address] [phone]       OFFICE FOLLOW-UP NOTE  [name] Date of Birth:  1946-08-18 Medical Record Number:  [id number]   HPI: [name] is a 77 year old pleasant Caucasian lady seen today for initial office follow-up visit following hospital admission for intracerebral hemorrhage in April 2021.  She is accompanied by her husband.  History is obtained from them and review of electronic medical records and I personally reviewed imaging films in PACS. She is a 77 year old lady with past medical history of hypertension who presented to [facility] on 02/13/2020 with sudden onset of severe headache.  She was sitting in front of the computer and noticed that headache on the top of her head which is quite severe and persisted without relief.  She came to the emergency room where routine blood work revealed evidence of thrombocytosis and CT scan showed intraventricular hemorrhage.  This involves mostly the right lateral ventricle and there was changes of chronic small vessel disease.  She was kept in the intensive care unit where blood pressure was tightly controlled and follow-up CT scan showed stable appearance of the hemorrhage.  MRI scan was obtained which also showed stable intraventricular hemorrhage and severe changes of small vessel disease and multiple foci of microhemorrhages raising possibility of amyloid angiopathy versus hypertensive microangiopathy.  MRA of the brain showed no large vessel stenosis or occlusion.  2D echo showed normal ejection fraction.  LDL cholesterol 92 mg percent.  Hemoglobin A1c was 6.1.  Initially the etiology of the hemorrhage was thought to be hypertensive but subsequently patient's showed neurological worsening and she developed new left temporal occipital hemorrhage.  MR venogram on 02/25/2020 raise possibility of deep cerebral venous thrombosis.  Patient became obtunded and less  responsive.  She started hypertonic saline note back to the ICU however she did not get better.  Subsequently a cerebral catheter angiogram was done 03/01/2020 by Dr. Manson Passey and after consent she did mechanical thrombectomy with recanalization of the vein of Galen and straight sinus with decreased clot burden in the torcula right transverse and sigmoid sinuses.  A residual nonocclusive clot was seen in the vein of Galen.  Patient had presented with thrombocytosis and she was diagnosed with polycythemia and followed by the hematology team and she underwent a pheresis.  She is started on hydroxyurea subsequently she developed thrombocytopenia and lab work suggested heparin-induced thrombocytopenia.  She was initially started on IV heparin for her venous sinus thrombosis but this was subsequently discontinued and she was started on IV argatroban which was continued till she was transferred to rehab and was able to swallow then she was switched to Pradaxa.  Patient spent 3 weeks in rehab and is gradually improved.  His speech and language are improved.  She is living at home with his husband.  She still has some word finding difficulties and occasional paraphasic errors but she is still getting ongoing speech therapy which is helpful.  She has no physical weakness and gait or balance problems.  Her platelet count is been followed regularly by Dr. Jana Hakim hematologist and she has recently been started back on hydroxyurea and her diagnosis is no change to essential thrombocytosis rather than polycythemia vera.  Patient's blood pressure has been running quite low with systolic in the 21H and 08M recently.  She is on Lasix, hydralazine, Norvasc.  She is having cognitive and memory issues but otherwise is doing fine. Update 08/30/2020 :  She returns for follow-up after last visit 4 months ago.  She is accompanied by her husband.  She states she is doing well.  Her speech has improved and speech therapy plans to  discharge her next week.  She is taking much better.  Her short-term memory is improving but is still not back to normal.  She has good days and bad days.  She has difficulty remembering names.  She is mostly independent in most activities for daily living with her husband does not live alone particularly when she is cooking.  Short-term memory remains poor.  She is tolerating Xarelto well without bleeding or bruising.  Blood pressures well controlled and she has discontinued Lasix and hydralazine.  Blood pressure today is 129/70.  She has no new complaints.  Her IVC filter was removed by Dr. Loreta Ave on 06/27/2020. Update 03/14/2021 : She returns for follow-up after last visit 6 months ago.  She is accompanied by her husband.  Patient continues to do well and has not had any recurrent stroke or TIA symptoms.  She remains on Xarelto which is tolerating well without bleeding or bruising.  However she fell at home on 12/15/2020 and suffered 2 compression fractures.  She underwent kyphoplasty and T7 by Dr. Hoyt Koch.  She continues to have mild short-term memory difficulties which appear to be unchanged though she did better on objective testing in the office today.  She has easy distractibility and trouble with multitasking.  She continues to follow-up with Dr. Darnelle Catalan for a polycythemia which appears to be quite well controlled on hydroxyurea and last hematocrit on 02/21/2021 was 34.9.  She has no new complaints today.  Update 03/14/2022 : She returns for follow-up after last visit a year ago.  She is accompanied by husband.  They feel her memory difficulties are about unchanged.  She continues to have short-term memory difficulties but it is not getting progressive.  She is still mostly independent in most activities of daily living.  Does do crossword puzzles and mentally challenging activities.  I had advised the patient get a follow-up MRI and MRV at last visit but for unclear reason that has not yet been done.   She remains on aspirin which is tolerating well without bruising or bleeding.  Her blood pressures under good control today  it is 129/75.  She is tolerating Lipitor well without muscle aches and pains. Update 03/27/2023 : She returns for follow-up after last visit a year ago.  She is accompanied by her husband.  She states she is doing well.  They feel her memory may be slightly better.  She does play games and does crossword puzzles regularly.  She stopped taking Prevagen as she felt it was making no difference.  She is Independent in activities of daily living.  She remains on aspirin which she is tolerating well without bruising or bleeding.  Her blood pressure is under good control.  She is tolerating Lipitor well without side effects.  On Mini-Mental status exam testing today she scored 27/30 which is stable from last visit.  She had MRI scan of the brain on 03/29/2022 which showed old right posterior temporal infarct and chronic changes of small vessel disease and mild atrophy.  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 ROS:   14 system review of systems is positive for  memory loss, speech difficulties, headache, confusion and all other systems negative  PMH:  Past Medical History:  Diagnosis Date   Arthritis    COPD (chronic obstructive pulmonary disease) (HCC)    Hepatitis C    Hyperlipidemia    Hypertension    Osteoporosis    Stroke (HCC)    Thrombocytosis     Social History:  Social History   Socioeconomic History   Marital status: Married    Spouse name: Theodoro Grist   Number of children: Not on file   Years of education: Not on file   Highest education level: Not on file  Occupational History   Occupation: Retired  Tobacco Use   Smoking status: Former    Types: Cigarettes    Quit date: 2003    Years since quitting: 21.3   Smokeless tobacco: Never  Vaping Use   Vaping Use: Never used   Substance and Sexual Activity   Alcohol use: Yes    Alcohol/week: 1.0 standard drink of alcohol    Types: 1 Glasses of wine per week    Comment: nightly   Drug use: No   Sexual activity: Not on file  Other Topics Concern   Not on file  Social History Narrative   Lives with spouse   Right Handed   Drinks 2-3 cups of caffeine daily   Social Determinants of Health   Financial Resource Strain: Not on file  Food Insecurity: Not on file  Transportation Needs: Not on file  Physical Activity: Not on file  Stress: Not on file  Social Connections: Not on file  Intimate Partner Violence: Not on file    Medications:   Current Outpatient Medications on File Prior to Visit  Medication Sig Dispense Refill   acetaminophen (TYLENOL) 500 MG tablet Take 1,000 mg by mouth every 6 (six) hours as needed for moderate pain or headache.     ADVAIR HFA 115-21 MCG/ACT inhaler Inhale 2 puffs into the lungs as needed.     amLODipine (NORVASC) 10 MG tablet Take 10 mg by mouth daily.  30 tablet 0   aspirin EC 81 MG tablet Take 1 tablet (81 mg total) by mouth daily. Swallow whole. 30 tablet 11   atorvastatin (LIPITOR) 20 MG tablet Take 1 tablet by mouth daily.     clobetasol cream (TEMOVATE) 0.05 % APPLY A THIN LAYER TO THE AFFECTED AREA(S) TOPICALLY TWICE DAILY     Cyanocobalamin (B-12) 5000 MCG CAPS Take 5,000 mcg by mouth daily.     docusate sodium (COLACE) 100 MG capsule Take 100 mg by mouth daily.     hydroxyurea (HYDREA) 500 MG capsule Take 1 capsule (500 mg total) by mouth daily. May take with food to minimize GI side effects. 90 capsule 3   levothyroxine (SYNTHROID) 125 MCG tablet Take 125 mcg by mouth every morning.     losartan (COZAAR) 50 MG tablet Take 1 tablet (50 mg total) by mouth 2 (two) times daily. 60 tablet 0   pantoprazole (PROTONIX) 20 MG tablet Take 20 mg by mouth daily.     solifenacin (VESICARE) 5 MG tablet Take 5 mg by mouth daily.     No current facility-administered medications  on file prior to visit.    Allergies:   Allergies  Allergen Reactions   Heparin Other (See Comments)    4/15 HIT OD 2.553, SRA negative > treated as true HIT   Erythromycin Diarrhea, Itching and Other (See Comments)    Severe stomach pains    Physical Exam General: well developed, well nourished pleasant elderly Caucasian lady seated, in  no evident distress Head: head normocephalic and atraumatic.  Neck: supple with no carotid or supraclavicular bruits Cardiovascular: regular rate and rhythm, no murmurs Musculoskeletal: no deformity Skin:  no rash/petichiae Vascular:  Normal pulses all extremities Vitals:   03/27/23 1255  BP: 114/70  Pulse: 74   Neurologic Exam Mental Status: Awake and fully alert. Oriented to place and time. Recent and remote memory poor. Attention span, concentration and fund of knowledge diminished. Mood and affect appropriate.  Diminished recall 1/3.  Able to name 11 animals that can walk on 4 legs.  Clock drawing 4 /4.  Speech seems mostly fluent though slightly hesitant but no paraphasic errors.  Good naming repetition comprehension. Cranial Nerves: Fundoscopic exam not done. Pupils equal, briskly reactive to light. Extraocular movements full without nystagmus. Visual fields full to confrontation. Hearing intact. Facial sensation intact. Face, tongue, palate moves normally and symmetrically.  Motor: Normal bulk and tone. Normal strength in all tested extremity muscles. Sensory.: intact to touch ,pinprick .position and vibratory sensation.  Coordination: Rapid alternating movements normal in all extremities. Finger-to-nose and heel-to-shin performed accurately bilaterally. Gait and Station: Arises from chair without difficulty. Stance is normal. Gait demonstrates normal stride length and balance . Able to heel, toe and tandem walk with mild difficulty.  Reflexes: 1+ and symmetric. Toes downgoing.      03/27/2023    1:00 PM 03/14/2022    2:07 PM  MMSE - Mini  Mental State Exam  Orientation to time 5 5  Orientation to Place 5 5  Registration 3 3  Attention/ Calculation 2 3  Recall 3 1  Language- name 2 objects 2 2  Language- repeat 1 1  Language- follow 3 step command 3 3  Language- read & follow direction 1 1  Write a sentence 1 1  Copy design 1 1  Total score 27 26      ASSESSMENT: 77 year old Caucasian lady with intraventricular hemorrhage due to deep cerebral venous sinus thrombosis likely related to polycythemia treated with mechanical thrombectomy and prolonged hospital course complicated by bilateral pulmonary embolism, heparin-induced thrombocytopenia and anemia.  Patient is doing relatively well now and her mild aphasia seems to have improved and cognitive impairment which appears unchanged     PLAN: I had a long discussion with the patient and her husband regarding her history of remote cerebral venous sinus thrombosis and she is done extremely well. Continue aspirin 81 mg daily for stroke prevention and maintain aggressive risk factor modification.     She also has mild cognitive impairment but is otherwise doing extremely well.  I recommend she increase participation in mentally challenging activities like solving crossword puzzles, playing bridge and sodoku. .  We also discussed memory compensation strategies.  She will return for follow-up with me in the future in a year or call earlier if necessary. Greater than 50% of time during this 35 minute visit was spent on counseling,explanation of diagnosis of , planning of further management of cerebral venous sinus thrombosis and mild cognitive impairment, discussion with patient and family and coordination of care Delia Heady, MD  [facility] [address] [place], [place] [address]  Phone [phone] Fax [phone] Note: This document was prepared with digital dictation and possible smart phrase technology. Any transcriptional errors that  result from this process are unintentional

## 2023-04-01 DIAGNOSIS — M25512 Pain in left shoulder: Secondary | ICD-10-CM | POA: Diagnosis not present

## 2023-04-08 DIAGNOSIS — M25512 Pain in left shoulder: Secondary | ICD-10-CM | POA: Diagnosis not present

## 2023-04-25 DIAGNOSIS — M25512 Pain in left shoulder: Secondary | ICD-10-CM | POA: Diagnosis not present

## 2023-05-01 DIAGNOSIS — M25512 Pain in left shoulder: Secondary | ICD-10-CM | POA: Diagnosis not present

## 2023-05-02 DIAGNOSIS — H25013 Cortical age-related cataract, bilateral: Secondary | ICD-10-CM | POA: Diagnosis not present

## 2023-05-02 DIAGNOSIS — H524 Presbyopia: Secondary | ICD-10-CM | POA: Diagnosis not present

## 2023-05-02 DIAGNOSIS — H26103 Unspecified traumatic cataract, bilateral: Secondary | ICD-10-CM | POA: Diagnosis not present

## 2023-05-06 ENCOUNTER — Other Ambulatory Visit: Payer: Self-pay

## 2023-05-06 ENCOUNTER — Inpatient Hospital Stay: Payer: Medicare HMO | Attending: Hematology and Oncology

## 2023-05-06 DIAGNOSIS — D473 Essential (hemorrhagic) thrombocythemia: Secondary | ICD-10-CM | POA: Diagnosis not present

## 2023-05-06 LAB — CMP (CANCER CENTER ONLY)
ALT: 21 U/L (ref 0–44)
AST: 18 U/L (ref 15–41)
Albumin: 4.2 g/dL (ref 3.5–5.0)
Alkaline Phosphatase: 73 U/L (ref 38–126)
Anion gap: 7 (ref 5–15)
BUN: 19 mg/dL (ref 8–23)
CO2: 28 mmol/L (ref 22–32)
Calcium: 9.9 mg/dL (ref 8.9–10.3)
Chloride: 107 mmol/L (ref 98–111)
Creatinine: 1.12 mg/dL — ABNORMAL HIGH (ref 0.44–1.00)
GFR, Estimated: 51 mL/min — ABNORMAL LOW (ref >60)
Glucose, Bld: 105 mg/dL — ABNORMAL HIGH (ref 70–99)
Potassium: 3.9 mmol/L (ref 3.5–5.1)
Sodium: 142 mmol/L (ref 135–145)
Total Bilirubin: 0.5 mg/dL (ref 0.3–1.2)
Total Protein: 7.3 g/dL (ref 6.5–8.1)

## 2023-05-06 LAB — CBC WITH DIFFERENTIAL (CANCER CENTER ONLY)
Abs Immature Granulocytes: 0.03 10*3/uL (ref 0.00–0.07)
Basophils Absolute: 0 10*3/uL (ref 0.0–0.1)
Basophils Relative: 1 %
Eosinophils Absolute: 0.1 10*3/uL (ref 0.0–0.5)
Eosinophils Relative: 2 %
HCT: 38.1 % (ref 36.0–46.0)
Hemoglobin: 12.9 g/dL (ref 12.0–15.0)
Immature Granulocytes: 1 %
Lymphocytes Relative: 24 %
Lymphs Abs: 1.3 10*3/uL (ref 0.7–4.0)
MCH: 37 pg — ABNORMAL HIGH (ref 26.0–34.0)
MCHC: 33.9 g/dL (ref 30.0–36.0)
MCV: 109.2 fL — ABNORMAL HIGH (ref 80.0–100.0)
Monocytes Absolute: 0.5 10*3/uL (ref 0.1–1.0)
Monocytes Relative: 9 %
Neutro Abs: 3.4 10*3/uL (ref 1.7–7.7)
Neutrophils Relative %: 63 %
Platelet Count: 270 10*3/uL (ref 150–400)
RBC: 3.49 MIL/uL — ABNORMAL LOW (ref 3.87–5.11)
RDW: 12.6 % (ref 11.5–15.5)
WBC Count: 5.3 10*3/uL (ref 4.0–10.5)
nRBC: 0 % (ref 0.0–0.2)

## 2023-05-07 DIAGNOSIS — M25512 Pain in left shoulder: Secondary | ICD-10-CM | POA: Diagnosis not present

## 2023-05-13 DIAGNOSIS — M25512 Pain in left shoulder: Secondary | ICD-10-CM | POA: Diagnosis not present

## 2023-05-21 DIAGNOSIS — M25512 Pain in left shoulder: Secondary | ICD-10-CM | POA: Diagnosis not present

## 2023-05-27 DIAGNOSIS — M25512 Pain in left shoulder: Secondary | ICD-10-CM | POA: Diagnosis not present

## 2023-06-03 DIAGNOSIS — M25512 Pain in left shoulder: Secondary | ICD-10-CM | POA: Diagnosis not present

## 2023-06-03 DIAGNOSIS — E039 Hypothyroidism, unspecified: Secondary | ICD-10-CM | POA: Diagnosis not present

## 2023-06-10 DIAGNOSIS — M25512 Pain in left shoulder: Secondary | ICD-10-CM | POA: Diagnosis not present

## 2023-06-23 DIAGNOSIS — Z6829 Body mass index (BMI) 29.0-29.9, adult: Secondary | ICD-10-CM | POA: Diagnosis not present

## 2023-06-23 DIAGNOSIS — R600 Localized edema: Secondary | ICD-10-CM | POA: Diagnosis not present

## 2023-06-23 DIAGNOSIS — R252 Cramp and spasm: Secondary | ICD-10-CM | POA: Diagnosis not present

## 2023-07-02 DIAGNOSIS — H6992 Unspecified Eustachian tube disorder, left ear: Secondary | ICD-10-CM | POA: Diagnosis not present

## 2023-07-02 DIAGNOSIS — H903 Sensorineural hearing loss, bilateral: Secondary | ICD-10-CM | POA: Diagnosis not present

## 2023-07-21 DIAGNOSIS — E538 Deficiency of other specified B group vitamins: Secondary | ICD-10-CM | POA: Diagnosis not present

## 2023-07-21 DIAGNOSIS — D649 Anemia, unspecified: Secondary | ICD-10-CM | POA: Diagnosis not present

## 2023-07-21 DIAGNOSIS — R7303 Prediabetes: Secondary | ICD-10-CM | POA: Diagnosis not present

## 2023-07-21 DIAGNOSIS — E559 Vitamin D deficiency, unspecified: Secondary | ICD-10-CM | POA: Diagnosis not present

## 2023-07-21 DIAGNOSIS — E039 Hypothyroidism, unspecified: Secondary | ICD-10-CM | POA: Diagnosis not present

## 2023-07-21 DIAGNOSIS — R0602 Shortness of breath: Secondary | ICD-10-CM | POA: Diagnosis not present

## 2023-07-21 DIAGNOSIS — Z6828 Body mass index (BMI) 28.0-28.9, adult: Secondary | ICD-10-CM | POA: Diagnosis not present

## 2023-08-05 ENCOUNTER — Other Ambulatory Visit: Payer: Self-pay | Admitting: Hematology and Oncology

## 2023-08-13 ENCOUNTER — Inpatient Hospital Stay: Payer: Medicare HMO | Attending: Hematology and Oncology

## 2023-08-13 ENCOUNTER — Inpatient Hospital Stay: Payer: Medicare HMO | Admitting: Hematology and Oncology

## 2023-08-13 VITALS — BP 133/63 | HR 69 | Temp 98.0°F | Resp 13 | Wt 151.9 lb

## 2023-08-13 DIAGNOSIS — Z79899 Other long term (current) drug therapy: Secondary | ICD-10-CM | POA: Diagnosis not present

## 2023-08-13 DIAGNOSIS — D75829 Heparin-induced thrombocytopenia, unspecified: Secondary | ICD-10-CM | POA: Diagnosis not present

## 2023-08-13 DIAGNOSIS — D473 Essential (hemorrhagic) thrombocythemia: Secondary | ICD-10-CM

## 2023-08-13 DIAGNOSIS — Z7982 Long term (current) use of aspirin: Secondary | ICD-10-CM | POA: Diagnosis not present

## 2023-08-13 DIAGNOSIS — Z87891 Personal history of nicotine dependence: Secondary | ICD-10-CM | POA: Diagnosis not present

## 2023-08-13 LAB — CMP (CANCER CENTER ONLY)
ALT: 16 U/L (ref 0–44)
AST: 17 U/L (ref 15–41)
Albumin: 4.3 g/dL (ref 3.5–5.0)
Alkaline Phosphatase: 67 U/L (ref 38–126)
Anion gap: 6 (ref 5–15)
BUN: 23 mg/dL (ref 8–23)
CO2: 27 mmol/L (ref 22–32)
Calcium: 9.8 mg/dL (ref 8.9–10.3)
Chloride: 108 mmol/L (ref 98–111)
Creatinine: 1.12 mg/dL — ABNORMAL HIGH (ref 0.44–1.00)
GFR, Estimated: 51 mL/min — ABNORMAL LOW (ref >60)
Glucose, Bld: 98 mg/dL (ref 70–99)
Potassium: 4 mmol/L (ref 3.5–5.1)
Sodium: 141 mmol/L (ref 135–145)
Total Bilirubin: 0.6 mg/dL (ref 0.3–1.2)
Total Protein: 7 g/dL (ref 6.5–8.1)

## 2023-08-13 LAB — CBC WITH DIFFERENTIAL (CANCER CENTER ONLY)
Abs Immature Granulocytes: 0.01 10*3/uL (ref 0.00–0.07)
Basophils Absolute: 0 10*3/uL (ref 0.0–0.1)
Basophils Relative: 1 %
Eosinophils Absolute: 0.1 10*3/uL (ref 0.0–0.5)
Eosinophils Relative: 2 %
HCT: 35 % — ABNORMAL LOW (ref 36.0–46.0)
Hemoglobin: 12.1 g/dL (ref 12.0–15.0)
Immature Granulocytes: 0 %
Lymphocytes Relative: 26 %
Lymphs Abs: 1.4 10*3/uL (ref 0.7–4.0)
MCH: 37.6 pg — ABNORMAL HIGH (ref 26.0–34.0)
MCHC: 34.6 g/dL (ref 30.0–36.0)
MCV: 108.7 fL — ABNORMAL HIGH (ref 80.0–100.0)
Monocytes Absolute: 0.6 10*3/uL (ref 0.1–1.0)
Monocytes Relative: 11 %
Neutro Abs: 3.2 10*3/uL (ref 1.7–7.7)
Neutrophils Relative %: 60 %
Platelet Count: 269 10*3/uL (ref 150–400)
RBC: 3.22 MIL/uL — ABNORMAL LOW (ref 3.87–5.11)
RDW: 13 % (ref 11.5–15.5)
WBC Count: 5.2 10*3/uL (ref 4.0–10.5)
nRBC: 0 % (ref 0.0–0.2)

## 2023-08-13 NOTE — Progress Notes (Signed)
[facility] Telephone:([phone]) [phone]   Fax:([phone]) [phone]  PROGRESS NOTE  Patient Care Team: Gweneth Dimitri, MD as PCP - General (Family Medicine) Jodelle Red, MD as PCP - Cardiology (Cardiology) Richardean Chimera, MD as Consulting Physician (Obstetrics and Gynecology) Micki Riley, MD as Consulting Physician (Neurology)  Hematological/Oncological History # Essential Thrombocytosis (a) JAK2 mutation positive  (b) bone marrow biopsy 02/13/2020 shows no evidence of leukemia (c)  leukapheresis x3 . Last leukapheresis Friday 02/18/2020   (i) HD catheter removed after pheresis on  02/18/19 (d) hydrea 500 mg daily to keep HCT <45 and platelets in normal range (e) Hydrea dose increased to 1000 mg daily Apr 07, 2020--not tolerated (f) Hydrea resumed at 500 mg daily June 2021 (g) 08/05/2022: transition care to Dr. Leonides Schanz due to Dr. Darrall Dears retirement.   Interval History:  [name] 77 y.o. female with medical history significant for essential thrombocytosis who presents for a follow up visit. The patient's last visit was on 02/04/2023. In the interim since the last visit she has had no major changes in her health.  On exam today [name] is accompanied by her husband.  She reports that she has been well overall in the last 6 months since our prior visit.  She reports that she did have an implant in her hip for bladder control.  She reports that she continues taking her hydroxyurea 500 mg daily.  She notes that she has not developed any ulcers in her mouth, leg ulcers, or nausea/vomiting/diarrhea.  She reports she continues taking her baby aspirin daily without any bleeding, bruising, or dark stools.  She reports her energy levels are good and her appetite is strong.  She currently reports her energy is about a 5 out of 10 though does fluctuate up and down.  She recently went on a trip to [place] via [place] cruise line.  She enjoyed seeing the humpback  whales in [facility].  Overall she is willing and able to continue hydroxyurea therapy at this time.  She denies any fevers, chills, sweats, nausea, vomiting or diarrhea.  A full 10 point ROS is listed below.  MEDICAL HISTORY:  Past Medical History:  Diagnosis Date   Arthritis    COPD (chronic obstructive pulmonary disease) (HCC)    Hepatitis C    Hyperlipidemia    Hypertension    Osteoporosis    Stroke (HCC)    Thrombocytosis     SURGICAL HISTORY: Past Surgical History:  Procedure Laterality Date   CHOLECYSTECTOMY     IR ANGIO INTRA EXTRACRAN SEL INTERNAL CAROTID BILAT MOD SED  02/24/2020   IR ANGIO VERTEBRAL SEL VERTEBRAL UNI R MOD SED  02/24/2020   IR ANGIOGRAM SELECTIVE EACH ADDITIONAL VESSEL  02/24/2020   IR IVC FILTER PLMT / S&I /IMG GUID/MOD SED  02/22/2020   IR IVC FILTER RETRIEVAL / S&I /IMG GUID/MOD SED  06/27/2020   IR RADIOLOGIST EVAL & MGMT  05/23/2020   IR THROMBECT VENO MECH MOD SED  02/24/2020   IR US GUIDE VASC ACCESS RIGHT  02/24/2020   IR US GUIDE VASC ACCESS RIGHT  02/24/2020   IR VENO SAGITTAL SINUS  02/24/2020   IR VENO/JUGULAR RIGHT  02/24/2020   KYPHOPLASTY N/A 03/01/2021   Procedure: Kyphoplasty Thoracic six, Thoracic seven with spinejack;  Surgeon: Bedelia Person, MD;  Location[facility] OR;  Service: Neurosurgery;  Laterality: N/A;   RADIOLOGY WITH ANESTHESIA N/A 02/24/2020   Procedure: IR WITH ANESTHESIA;  Surgeon: Radiologist, Medication,  MD;  Location: [facility] OR;  Service: Radiology;  Laterality: N/A;   TONSILLECTOMY      SOCIAL HISTORY: Social History   Socioeconomic History   Marital status: Married    Spouse name: Theodoro Grist   Number of children: Not on file   Years of education: Not on file   Highest education level: Not on file  Occupational History   Occupation: Retired  Tobacco Use   Smoking status: Former    Current packs/day: 0.00    Types: Cigarettes    Quit date: 2003    Years since quitting: 21.7   Smokeless tobacco: Never  Vaping Use    Vaping status: Never Used  Substance and Sexual Activity   Alcohol use: Yes    Alcohol/week: 1.0 standard drink of alcohol    Types: 1 Glasses of wine per week    Comment: nightly   Drug use: No   Sexual activity: Not on file  Other Topics Concern   Not on file  Social History Narrative   Lives with spouse   Right Handed   Drinks 2-3 cups of caffeine daily   Social Determinants of Health   Financial Resource Strain: Not on file  Food Insecurity: Not on file  Transportation Needs: Not on file  Physical Activity: Not on file  Stress: Not on file  Social Connections: Not on file  Intimate Partner Violence: Not on file    FAMILY HISTORY: Family History  Problem Relation Age of Onset   Heart disease Mother    Heart disease Father     ALLERGIES:  is allergic to heparin and erythromycin.  MEDICATIONS:  Current Outpatient Medications  Medication Sig Dispense Refill   acetaminophen (TYLENOL) 500 MG tablet Take 1,000 mg by mouth every 6 (six) hours as needed for moderate pain or headache.     ADVAIR HFA 115-21 MCG/ACT inhaler Inhale 2 puffs into the lungs as needed.     amLODipine (NORVASC) 10 MG tablet Take 10 mg by mouth daily.  30 tablet 0   aspirin EC 81 MG tablet Take 1 tablet (81 mg total) by mouth daily. Swallow whole. 30 tablet 11   atorvastatin (LIPITOR) 20 MG tablet Take 1 tablet by mouth daily.     clobetasol cream (TEMOVATE) 0.05 % APPLY A THIN LAYER TO THE AFFECTED AREA(S) TOPICALLY TWICE DAILY     Cyanocobalamin (B-12) 5000 MCG CAPS Take 5,000 mcg by mouth daily.     docusate sodium (COLACE) 100 MG capsule Take 100 mg by mouth daily.     hydroxyurea (HYDREA) 500 MG capsule TAKE 1 CAPSULE BY MOUTH ONCE DAILY WITH FOOD TO  MINIMIZE GI SIDE EFFECTS 90 capsule 0   levothyroxine (SYNTHROID) 125 MCG tablet Take 125 mcg by mouth every morning.     losartan (COZAAR) 50 MG tablet Take 1 tablet (50 mg total) by mouth 2 (two) times daily. 60 tablet 0   pantoprazole  (PROTONIX) 20 MG tablet Take 20 mg by mouth daily.     No current facility-administered medications for this visit.    REVIEW OF SYSTEMS:   Constitutional: ( - ) fevers, ( - )  chills , ( - ) night sweats Eyes: ( - ) blurriness of vision, ( - ) double vision, ( - ) watery eyes Ears, nose, mouth, throat, and face: ( - ) mucositis, ( - ) sore throat Respiratory: ( - ) cough, ( - ) dyspnea, ( - ) wheezes Cardiovascular: ( - ) palpitation, ( - )  chest discomfort, ( - ) lower extremity swelling Gastrointestinal:  ( - ) nausea, ( - ) heartburn, ( - ) change in bowel habits Skin: ( - ) abnormal skin rashes Lymphatics: ( - ) new lymphadenopathy, ( - ) easy bruising Neurological: ( - ) numbness, ( - ) tingling, ( - ) new weaknesses Behavioral/Psych: ( - ) mood change, ( - ) new changes  All other systems were reviewed with the patient and are negative.  PHYSICAL EXAMINATION: ECOG PERFORMANCE STATUS: 0 - Asymptomatic  Vitals:   08/13/23 1101  BP: 133/63  Pulse: 69  Resp: 13  Temp: 98 F (36.7 C)  SpO2: 99%     Filed Weights   08/13/23 1101  Weight: 151 lb 14.4 oz (68.9 kg)      GENERAL: Well-appearing elderly Caucasian female, alert, no distress and comfortable SKIN: skin color, texture, turgor are normal, no rashes or significant lesions EYES: conjunctiva are pink and non-injected, sclera clear LUNGS: clear to auscultation and percussion with normal breathing effort HEART: regular rate & rhythm and no murmurs and no lower extremity edema Musculoskeletal: no cyanosis of digits and no clubbing  PSYCH: alert & oriented x 3, fluent speech NEURO: no focal motor/sensory deficits  LABORATORY DATA:  I have reviewed the data as listed    Latest Ref Rng & Units 08/13/2023   10:26 AM 05/06/2023   10:28 AM 02/04/2023   10:21 AM  CBC  WBC 4.0 - 10.5 K/uL 5.2  5.3  5.5   Hemoglobin 12.0 - 15.0 g/dL 78.2  95.6  21.3   Hematocrit 36.0 - 46.0 % 35.0  38.1  38.8   Platelets 150 - 400  K/uL 269  270  268        Latest Ref Rng & Units 08/13/2023   10:26 AM 05/06/2023   10:28 AM 02/04/2023   10:21 AM  CMP  Glucose 70 - 99 mg/dL 98  086  578   BUN 8 - 23 mg/dL 23  19  14    Creatinine 0.44 - 1.00 mg/dL 4.69  6.29  5.28   Sodium 135 - 145 mmol/L 141  142  141   Potassium 3.5 - 5.1 mmol/L 4.0  3.9  3.9   Chloride 98 - 111 mmol/L 108  107  106   CO2 22 - 32 mmol/L 27  28  29    Calcium 8.9 - 10.3 mg/dL 9.8  9.9  9.8   Total Protein 6.5 - 8.1 g/dL 7.0  7.3  7.1   Total Bilirubin 0.3 - 1.2 mg/dL 0.6  0.5  0.7   Alkaline Phos 38 - 126 U/L 67  73  69   AST 15 - 41 U/L 17  18  18    ALT 0 - 44 U/L 16  21  18      RADIOGRAPHIC STUDIES: No results found.  ASSESSMENT & PLAN [name] 77 y.o. female with medical history significant for essential thrombocytosis who presents for a follow up visit.  # Essential Thrombocytosis, JAK2 Positive  -- Continue hydroxyurea 500 mg p.o. daily as well as aspirin 81 mg p.o. daily -- Labs today show white blood cell count 5.2, Hgb 12.1, MCV 108.7, Plt 269.  Currently blood counts are on target. -- Plan to maintain platelets between 150 and 400 -- Close monitoring for progression to myelofibrosis or acute leukemia. -- Return to clinic in 6 months time with interval 13-month labs.  # Heparin Induced Thrombocytopenia --avoid use of heparin and  heparin products --recommend call to oncology if alterative anticoagulation  No orders of the defined types were placed in this encounter.   All questions were answered. The patient knows to call the clinic with any problems, questions or concerns.  A total of more than 25 minutes were spent on this encounter with face-to-face time and non-face-to-face time, including preparing to see the patient, ordering tests and/or medications, counseling the patient and coordination of care as outlined above.   Ulysees Barns, MD Department of Hematology/Oncology [facility] Phone: [phone] Pager: [phone] Email: [email or web address]  08/13/2023 1:29 PM

## 2023-09-08 DIAGNOSIS — E782 Mixed hyperlipidemia: Secondary | ICD-10-CM | POA: Diagnosis not present

## 2023-09-08 DIAGNOSIS — I10 Essential (primary) hypertension: Secondary | ICD-10-CM | POA: Diagnosis not present

## 2023-09-08 DIAGNOSIS — J449 Chronic obstructive pulmonary disease, unspecified: Secondary | ICD-10-CM | POA: Diagnosis not present

## 2023-09-08 DIAGNOSIS — Z6828 Body mass index (BMI) 28.0-28.9, adult: Secondary | ICD-10-CM | POA: Diagnosis not present

## 2023-09-08 DIAGNOSIS — R7303 Prediabetes: Secondary | ICD-10-CM | POA: Diagnosis not present

## 2023-09-08 DIAGNOSIS — M810 Age-related osteoporosis without current pathological fracture: Secondary | ICD-10-CM | POA: Diagnosis not present

## 2023-09-08 DIAGNOSIS — Z23 Encounter for immunization: Secondary | ICD-10-CM | POA: Diagnosis not present

## 2023-09-08 DIAGNOSIS — E039 Hypothyroidism, unspecified: Secondary | ICD-10-CM | POA: Diagnosis not present

## 2023-09-29 DIAGNOSIS — Z23 Encounter for immunization: Secondary | ICD-10-CM | POA: Diagnosis not present

## 2023-11-19 ENCOUNTER — Inpatient Hospital Stay: Payer: Medicare HMO | Attending: Hematology and Oncology

## 2023-11-19 ENCOUNTER — Other Ambulatory Visit: Payer: Self-pay | Admitting: Hematology and Oncology

## 2023-11-19 DIAGNOSIS — D473 Essential (hemorrhagic) thrombocythemia: Secondary | ICD-10-CM

## 2023-11-19 LAB — CMP (CANCER CENTER ONLY)
ALT: 17 U/L (ref 0–44)
AST: 18 U/L (ref 15–41)
Albumin: 4.5 g/dL (ref 3.5–5.0)
Alkaline Phosphatase: 67 U/L (ref 38–126)
Anion gap: 8 (ref 5–15)
BUN: 21 mg/dL (ref 8–23)
CO2: 28 mmol/L (ref 22–32)
Calcium: 10.1 mg/dL (ref 8.9–10.3)
Chloride: 107 mmol/L (ref 98–111)
Creatinine: 1.23 mg/dL — ABNORMAL HIGH (ref 0.44–1.00)
GFR, Estimated: 45 mL/min — ABNORMAL LOW (ref >60)
Glucose, Bld: 114 mg/dL — ABNORMAL HIGH (ref 70–99)
Potassium: 4 mmol/L (ref 3.5–5.1)
Sodium: 143 mmol/L (ref 135–145)
Total Bilirubin: 0.6 mg/dL (ref 0.0–1.2)
Total Protein: 7.2 g/dL (ref 6.5–8.1)

## 2023-11-19 LAB — CBC WITH DIFFERENTIAL (CANCER CENTER ONLY)
Abs Immature Granulocytes: 0.03 10*3/uL (ref 0.00–0.07)
Basophils Absolute: 0.1 10*3/uL (ref 0.0–0.1)
Basophils Relative: 1 %
Eosinophils Absolute: 0.1 10*3/uL (ref 0.0–0.5)
Eosinophils Relative: 1 %
HCT: 38.9 % (ref 36.0–46.0)
Hemoglobin: 13.2 g/dL (ref 12.0–15.0)
Immature Granulocytes: 0 %
Lymphocytes Relative: 22 %
Lymphs Abs: 1.5 10*3/uL (ref 0.7–4.0)
MCH: 36.6 pg — ABNORMAL HIGH (ref 26.0–34.0)
MCHC: 33.9 g/dL (ref 30.0–36.0)
MCV: 107.8 fL — ABNORMAL HIGH (ref 80.0–100.0)
Monocytes Absolute: 0.5 10*3/uL (ref 0.1–1.0)
Monocytes Relative: 8 %
Neutro Abs: 4.8 10*3/uL (ref 1.7–7.7)
Neutrophils Relative %: 68 %
Platelet Count: 309 10*3/uL (ref 150–400)
RBC: 3.61 MIL/uL — ABNORMAL LOW (ref 3.87–5.11)
RDW: 12.9 % (ref 11.5–15.5)
WBC Count: 7 10*3/uL (ref 4.0–10.5)
nRBC: 0 % (ref 0.0–0.2)

## 2023-12-16 ENCOUNTER — Other Ambulatory Visit: Payer: Self-pay | Admitting: Hematology and Oncology

## 2024-01-13 ENCOUNTER — Emergency Department (HOSPITAL_BASED_OUTPATIENT_CLINIC_OR_DEPARTMENT_OTHER): Admitting: Radiology

## 2024-01-13 ENCOUNTER — Other Ambulatory Visit: Payer: Self-pay

## 2024-01-13 ENCOUNTER — Encounter (HOSPITAL_BASED_OUTPATIENT_CLINIC_OR_DEPARTMENT_OTHER): Payer: Self-pay | Admitting: Emergency Medicine

## 2024-01-13 ENCOUNTER — Emergency Department (HOSPITAL_BASED_OUTPATIENT_CLINIC_OR_DEPARTMENT_OTHER)

## 2024-01-13 ENCOUNTER — Emergency Department (HOSPITAL_BASED_OUTPATIENT_CLINIC_OR_DEPARTMENT_OTHER)
Admission: EM | Admit: 2024-01-13 | Discharge: 2024-01-13 | Discharge status: Against Medical Advice | Attending: Emergency Medicine | Admitting: Emergency Medicine

## 2024-01-13 DIAGNOSIS — M545 Low back pain, unspecified: Secondary | ICD-10-CM | POA: Diagnosis not present

## 2024-01-13 DIAGNOSIS — J439 Emphysema, unspecified: Secondary | ICD-10-CM | POA: Diagnosis not present

## 2024-01-13 DIAGNOSIS — M25552 Pain in left hip: Secondary | ICD-10-CM | POA: Insufficient documentation

## 2024-01-13 DIAGNOSIS — Z79899 Other long term (current) drug therapy: Secondary | ICD-10-CM | POA: Insufficient documentation

## 2024-01-13 DIAGNOSIS — W108XXA Fall (on) (from) other stairs and steps, initial encounter: Secondary | ICD-10-CM | POA: Diagnosis not present

## 2024-01-13 DIAGNOSIS — M4316 Spondylolisthesis, lumbar region: Secondary | ICD-10-CM | POA: Diagnosis not present

## 2024-01-13 DIAGNOSIS — M546 Pain in thoracic spine: Secondary | ICD-10-CM | POA: Insufficient documentation

## 2024-01-13 DIAGNOSIS — R0781 Pleurodynia: Secondary | ICD-10-CM | POA: Insufficient documentation

## 2024-01-13 DIAGNOSIS — I700 Atherosclerosis of aorta: Secondary | ICD-10-CM | POA: Diagnosis not present

## 2024-01-13 DIAGNOSIS — M47816 Spondylosis without myelopathy or radiculopathy, lumbar region: Secondary | ICD-10-CM | POA: Diagnosis not present

## 2024-01-13 DIAGNOSIS — Z7982 Long term (current) use of aspirin: Secondary | ICD-10-CM | POA: Insufficient documentation

## 2024-01-13 DIAGNOSIS — M549 Dorsalgia, unspecified: Secondary | ICD-10-CM

## 2024-01-13 DIAGNOSIS — I10 Essential (primary) hypertension: Secondary | ICD-10-CM | POA: Diagnosis not present

## 2024-01-13 DIAGNOSIS — Z043 Encounter for examination and observation following other accident: Secondary | ICD-10-CM | POA: Diagnosis not present

## 2024-01-13 DIAGNOSIS — N2889 Other specified disorders of kidney and ureter: Secondary | ICD-10-CM | POA: Diagnosis not present

## 2024-01-13 DIAGNOSIS — W19XXXA Unspecified fall, initial encounter: Secondary | ICD-10-CM

## 2024-01-13 LAB — BASIC METABOLIC PANEL
Anion gap: 7 (ref 5–15)
BUN: 17 mg/dL (ref 8–23)
CO2: 26 mmol/L (ref 22–32)
Calcium: 9.4 mg/dL (ref 8.9–10.3)
Chloride: 103 mmol/L (ref 98–111)
Creatinine, Ser: 1.33 mg/dL — ABNORMAL HIGH (ref 0.44–1.00)
GFR, Estimated: 41 mL/min — ABNORMAL LOW (ref >60)
Glucose, Bld: 103 mg/dL — ABNORMAL HIGH (ref 70–99)
Potassium: 4.2 mmol/L (ref 3.5–5.1)
Sodium: 136 mmol/L (ref 135–145)

## 2024-01-13 MED ORDER — IOHEXOL 300 MG/ML  SOLN
100.0000 mL | Freq: Once | INTRAMUSCULAR | Status: AC | PRN
  Administered 2024-01-13: 80 mL via INTRAVENOUS

## 2024-01-13 MED ORDER — IBUPROFEN 800 MG PO TABS
800.0000 mg | ORAL_TABLET | Freq: Once | ORAL | Status: AC
  Administered 2024-01-13: 800 mg via ORAL
  Filled 2024-01-13: qty 1

## 2024-01-13 NOTE — ED Triage Notes (Signed)
 Mechanical fall this morning around 0300. C/o R sided rib pain. Denies hitting head. Takes asa daily. States hard to take deep breath.

## 2024-01-13 NOTE — Discharge Instructions (Addendum)
 You have elected to sign out AGAINST MEDICAL ADVICE with a workup that is still pending.  Your CT chest abdomen pelvis had not yet been read out by radiology at time of your decision to leave AMA.  This runs the risk of a missed diagnosis to include missed traumatic injury, missed fracture which could result in significant morbidity and mortality.  Please follow-up with your primary care provider, return for any severe worsening symptoms.

## 2024-01-13 NOTE — ED Notes (Signed)
 Sent Dr. Rosalia Hammers message for pain medication

## 2024-01-13 NOTE — ED Notes (Signed)
 Pt in CT.

## 2024-01-13 NOTE — ED Provider Notes (Addendum)
  Physical Exam  BP 124/64   Pulse 73   Temp 98.7 F (37.1 C)   Resp 16   SpO2 100%   Physical Exam  Procedures  Procedures  ED Course / MDM    Medical Decision Making Amount and/or Complexity of Data Reviewed Radiology: ordered.  Risk Prescription drug management.   102F, recent fall, CT scans pending.   Patient CT thoracic and lumbar spine revealed old chronic compression fractures, some of undetermined acuity.  I palpated the patient's spine with no focal tenderness to palpation. She has no acute red flag symptoms to suggest cauda equina syndrome and is ambulatory on reassessment.  She has no bilateral lower extremity weakness, no saddle anesthesia.  She has chronic issues with bladder incontinence which is not new, no new fecal incontinence.  Do not think emergent MRI is indicated at this time.  The patient was ambulatory to the bathroom without discomfort.  Patient CT chest abdomen pelvis was pending when I was informed by family that they were requesting to sign out AGAINST MEDICAL ADVICE.  I informed them of the risk of missed diagnosis due to patient's CT not being read out at this time.  Despite this, family and patient expressed a desire to sign out AMA.    Ernie Avena, MD 01/13/24 Nobie Putnam    Ernie Avena, MD 01/13/24 [phone]

## 2024-01-13 NOTE — ED Provider Notes (Signed)
 [place] EMERGENCY DEPARTMENT AT [facility] Provider Note   CSN: [id number] Arrival date & time: 01/13/24  1002     History  Chief Complaint  Patient presents with   [name] is a 78 y.o. female.  HPI 78 year old female history of hypertension, hep C, intraventricular hemorrhage, a flutter, PE, VTE, anemia, presents today after mechanical fall.  States that she was going down the steps when she slipped and fell.  She landed on her back and her hips.  She did not strike her head or have loss of conscious.  She is having pain in her mid to low back.  She also describes pain around her anterior lower rib cage.    Home Medications Prior to Admission medications   Medication Sig Start Date End Date Taking? Authorizing Provider  acetaminophen (TYLENOL) 500 MG tablet Take 1,000 mg by mouth every 6 (six) hours as needed for moderate pain or headache.    [provider]  ADVAIR [facility] 829-56 MCG/ACT inhaler Inhale 2 puffs into the lungs as needed. 01/13/22   [provider]  amLODipine (NORVASC) 10 MG tablet Take 10 mg by mouth daily.  05/22/20   Magrinat, Valentino Hue, MD  aspirin EC 81 MG tablet Take 1 tablet (81 mg total) by mouth daily. Swallow whole. 03/14/21   Micki Riley, MD  atorvastatin (LIPITOR) 20 MG tablet Take 1 tablet by mouth daily.    [provider]  clobetasol cream (TEMOVATE) 0.05 % APPLY A THIN LAYER TO THE AFFECTED AREA(S) TOPICALLY TWICE DAILY 07/03/22   [provider]  Cyanocobalamin (B-12) 5000 MCG CAPS Take 5,000 mcg by mouth daily.    [provider]  docusate sodium (COLACE) 100 MG capsule Take 100 mg by mouth daily.    [provider]  hydroxyurea (HYDREA) 500 MG capsule TAKE 1 CAPSULE BY MOUTH ONCE DAILY WITH FOOD TO  MINIMIZE  GI  SIDE  EFFECTS 12/17/23   Jaci Standard, MD  levothyroxine (SYNTHROID) 125 MCG tablet Take 125 mcg by mouth every morning. 05/31/22   [provider]   losartan (COZAAR) 50 MG tablet Take 1 tablet (50 mg total) by mouth 2 (two) times daily. 03/29/20   Love, Evlyn Kanner, PA-C  pantoprazole (PROTONIX) 20 MG tablet Take 20 mg by mouth daily. 01/19/21   [provider]      Allergies    Heparin and Erythromycin    Review of Systems   Review of Systems  Physical Exam Updated Vital Signs BP 124/64   Pulse 73   Temp 98.7 F (37.1 C)   Resp 16   SpO2 100%  Physical Exam Vitals and nursing note reviewed.  Constitutional:      General: She is not in acute distress.    Appearance: She is well-developed.  HENT:     Head: Normocephalic and atraumatic.     Right Ear: External ear normal.     Left Ear: External ear normal.     Nose: Nose normal.  Eyes:     Conjunctiva/sclera: Conjunctivae normal.     Pupils: Pupils are equal, round, and reactive to light.  Cardiovascular:     Rate and Rhythm: Normal rate.  Pulmonary:     Effort: Pulmonary effort is normal.  Abdominal:     General: Abdomen is flat. Bowel sounds are normal.  Musculoskeletal:        General: Normal range of motion.     Cervical  back: Normal range of motion and neck supple.     Comments: Diffuse tenderness to lower thoracic and upper lumbar spine and back Visual inspection reveals no obvious signs of trauma There is tenderness over the left hip  Skin:    General: Skin is warm and dry.  Neurological:     Mental Status: She is alert and oriented to person, place, and time.     Motor: No abnormal muscle tone.     Coordination: Coordination normal.  Psychiatric:        Behavior: Behavior normal.        Thought Content: Thought content normal.     ED Results / Procedures / Treatments   Labs (all labs ordered are listed, but only abnormal results are displayed) Labs Reviewed - No data to display  EKG None  Radiology DG Lumbar Spine Complete Result Date: 01/13/2024 CLINICAL DATA:  Fall this morning.  Low back pain. EXAM: LUMBAR SPINE - COMPLETE 4+ VIEW  COMPARISON:  None Available. FINDINGS: There is no evidence of lumbar spine fracture. Intervertebral disc spaces are maintained. Bilateral facet DJD is seen at L4-5 and L5-S1. Mild anterolisthesis at L4-5 measures 5 mm, attributable to the facet arthropathy. Aortic atherosclerotic calcification incidentally noted. Transsacral neurostimulator device is also seen. IMPRESSION: No acute findings. Lower lumbar facet DJD, with mild anterolisthesis at L4-5. Electronically Signed   By: Danae Orleans M.D.   On: 01/13/2024 13:03   DG Chest 2 View Result Date: 01/13/2024 CLINICAL DATA:  Fall this morning. Right-sided pleuritic chest pain. EXAM: CHEST - 2 VIEW COMPARISON:  12/15/2020 FINDINGS: The heart size and mediastinal contours are within normal limits. Calcified mediastinal lymph nodes, consistent with old granulomatous disease. Both lungs are clear. No evidence of pneumothorax or hemothorax. Several old mid and lower thoracic vertebral body compression deformities are seen with 2 adjacent vertebroplasties. IMPRESSION: No acute findings.  No active cardiopulmonary disease. Electronically Signed   By: Danae Orleans M.D.   On: 01/13/2024 13:00    Procedures Procedures    Medications Ordered in ED Medications  ibuprofen (ADVIL) tablet 800 mg (has no administration in time range)    ED Course/ Medical Decision Making/ A&P                                 Medical Decision Making Amount and/or Complexity of Data Reviewed Radiology: ordered.   78 year old female history of osteoporosis, who had fall today.  She is complaining of back pain and cage pain.  She was initially seen and evaluated by nursing and had plain x-rays obtained that showed no evidence of acute fracture. On my exam patient has some mid back pain and some bilateral rib cage pain. CT chest abdomen pelvis with recons of back ordered.  Patient also has some pain over her left hip and x-Pearlena Ow and CT are ordered of hip These studies are  currently pending Discussed patient's care with Dr. Karene Fry who will assume care and disposition after images have resulted and as appropriate        Final Clinical Impression(s) / ED Diagnoses Final diagnoses:  Fall, initial encounter  Acute midline back pain, unspecified back location  Pain of left hip    Rx / DC Orders ED Discharge Orders     None         Margarita Grizzle, MD 01/13/24 1528

## 2024-01-29 DIAGNOSIS — M546 Pain in thoracic spine: Secondary | ICD-10-CM | POA: Diagnosis not present

## 2024-01-29 DIAGNOSIS — S22040D Wedge compression fracture of fourth thoracic vertebra, subsequent encounter for fracture with routine healing: Secondary | ICD-10-CM | POA: Diagnosis not present

## 2024-01-29 DIAGNOSIS — Z6829 Body mass index (BMI) 29.0-29.9, adult: Secondary | ICD-10-CM | POA: Diagnosis not present

## 2024-01-30 ENCOUNTER — Other Ambulatory Visit (HOSPITAL_COMMUNITY): Payer: Self-pay | Admitting: Family Medicine

## 2024-01-30 DIAGNOSIS — S22040D Wedge compression fracture of fourth thoracic vertebra, subsequent encounter for fracture with routine healing: Secondary | ICD-10-CM

## 2024-02-02 DIAGNOSIS — S22040A Wedge compression fracture of fourth thoracic vertebra, initial encounter for closed fracture: Secondary | ICD-10-CM | POA: Diagnosis not present

## 2024-02-02 DIAGNOSIS — Z6828 Body mass index (BMI) 28.0-28.9, adult: Secondary | ICD-10-CM | POA: Diagnosis not present

## 2024-02-05 ENCOUNTER — Ambulatory Visit (HOSPITAL_COMMUNITY)
Admission: RE | Admit: 2024-02-05 | Discharge: 2024-02-05 | Disposition: A | Discharge status: Home / Self Care | Source: Ambulatory Visit | Attending: Family Medicine | Admitting: Family Medicine

## 2024-02-05 DIAGNOSIS — S22040D Wedge compression fracture of fourth thoracic vertebra, subsequent encounter for fracture with routine healing: Secondary | ICD-10-CM | POA: Insufficient documentation

## 2024-02-05 DIAGNOSIS — M4854XD Collapsed vertebra, not elsewhere classified, thoracic region, subsequent encounter for fracture with routine healing: Secondary | ICD-10-CM | POA: Diagnosis not present

## 2024-02-05 DIAGNOSIS — R29890 Loss of height: Secondary | ICD-10-CM | POA: Diagnosis not present

## 2024-02-05 DIAGNOSIS — M40204 Unspecified kyphosis, thoracic region: Secondary | ICD-10-CM | POA: Diagnosis not present

## 2024-02-19 ENCOUNTER — Inpatient Hospital Stay: Payer: Medicare HMO | Admitting: Hematology and Oncology

## 2024-02-19 ENCOUNTER — Inpatient Hospital Stay: Payer: Medicare HMO | Attending: Hematology and Oncology

## 2024-02-19 ENCOUNTER — Other Ambulatory Visit: Payer: Self-pay | Admitting: Hematology and Oncology

## 2024-02-19 ENCOUNTER — Other Ambulatory Visit: Payer: Self-pay

## 2024-02-19 VITALS — BP 137/85 | HR 68 | Temp 98.0°F | Resp 16 | Wt 154.6 lb

## 2024-02-19 DIAGNOSIS — D473 Essential (hemorrhagic) thrombocythemia: Secondary | ICD-10-CM | POA: Insufficient documentation

## 2024-02-19 DIAGNOSIS — D75838 Other thrombocytosis: Secondary | ICD-10-CM | POA: Diagnosis not present

## 2024-02-19 DIAGNOSIS — D75829 Heparin-induced thrombocytopenia, unspecified: Secondary | ICD-10-CM | POA: Insufficient documentation

## 2024-02-19 DIAGNOSIS — Z87891 Personal history of nicotine dependence: Secondary | ICD-10-CM | POA: Insufficient documentation

## 2024-02-19 DIAGNOSIS — Z79899 Other long term (current) drug therapy: Secondary | ICD-10-CM | POA: Insufficient documentation

## 2024-02-19 LAB — CBC WITH DIFFERENTIAL (CANCER CENTER ONLY)
Abs Immature Granulocytes: 0.03 10*3/uL (ref 0.00–0.07)
Basophils Absolute: 0.1 10*3/uL (ref 0.0–0.1)
Basophils Relative: 1 %
Eosinophils Absolute: 0.1 10*3/uL (ref 0.0–0.5)
Eosinophils Relative: 1 %
HCT: 37.9 % (ref 36.0–46.0)
Hemoglobin: 12.6 g/dL (ref 12.0–15.0)
Immature Granulocytes: 0 %
Lymphocytes Relative: 21 %
Lymphs Abs: 1.5 10*3/uL (ref 0.7–4.0)
MCH: 35.7 pg — ABNORMAL HIGH (ref 26.0–34.0)
MCHC: 33.2 g/dL (ref 30.0–36.0)
MCV: 107.4 fL — ABNORMAL HIGH (ref 80.0–100.0)
Monocytes Absolute: 0.7 10*3/uL (ref 0.1–1.0)
Monocytes Relative: 9 %
Neutro Abs: 4.8 10*3/uL (ref 1.7–7.7)
Neutrophils Relative %: 68 %
Platelet Count: 313 10*3/uL (ref 150–400)
RBC: 3.53 MIL/uL — ABNORMAL LOW (ref 3.87–5.11)
RDW: 13.2 % (ref 11.5–15.5)
WBC Count: 7.1 10*3/uL (ref 4.0–10.5)
nRBC: 0 % (ref 0.0–0.2)

## 2024-02-19 LAB — CMP (CANCER CENTER ONLY)
ALT: 14 U/L (ref 0–44)
AST: 17 U/L (ref 15–41)
Albumin: 4.4 g/dL (ref 3.5–5.0)
Alkaline Phosphatase: 68 U/L (ref 38–126)
Anion gap: 4 — ABNORMAL LOW (ref 5–15)
BUN: 21 mg/dL (ref 8–23)
CO2: 29 mmol/L (ref 22–32)
Calcium: 10.4 mg/dL — ABNORMAL HIGH (ref 8.9–10.3)
Chloride: 108 mmol/L (ref 98–111)
Creatinine: 1.11 mg/dL — ABNORMAL HIGH (ref 0.44–1.00)
GFR, Estimated: 51 mL/min — ABNORMAL LOW (ref >60)
Glucose, Bld: 104 mg/dL — ABNORMAL HIGH (ref 70–99)
Potassium: 4.4 mmol/L (ref 3.5–5.1)
Sodium: 141 mmol/L (ref 135–145)
Total Bilirubin: 0.4 mg/dL (ref 0.0–1.2)
Total Protein: 7.2 g/dL (ref 6.5–8.1)

## 2024-02-19 NOTE — Progress Notes (Signed)
 [facility] [phone]   [phone]  PROGRESS NOTE  Patient Care Team: Gweneth Dimitri, MD as PCP - General (Family Medicine) Jodelle Red, MD as PCP - Cardiology (Cardiology) Richardean Chimera, MD as Consulting Physician (Obstetrics and Gynecology) Micki Riley, MD as Consulting Physician (Neurology)  Hematological/Oncological History # Essential Thrombocytosis (a) JAK2 mutation positive  (b) bone marrow biopsy 02/13/2020 shows no evidence of leukemia (c)  leukapheresis x3 . Last leukapheresis Friday 02/18/2020   (i) HD catheter removed after pheresis on  02/18/19 (d) hydrea 500 mg daily to keep HCT <45 and platelets in normal range (e) Hydrea dose increased to 1000 mg daily Apr 07, 2020--not tolerated (f) Hydrea resumed at 500 mg daily June 2021 (g) 08/05/2022: transition care to Dr. Leonides Schanz due to Dr. Darrall Dears retirement.   Interval History:  [name] 78 y.o. female with medical history significant for essential thrombocytosis who presents for a follow up visit. The patient's last visit was on 02/04/2023. In the interim since the last visit she has had no major changes in her health.  On exam today [name] is accompanied by her husband.  She reports she has been well overall interim since her last visit other than a fall down the stairs.  She reports that 37M she was cleaning the cat litter box and slipped down the stairs.  She was in the emergency department and told that she had a T4 compression fracture.  She previously had worked on her spine at the T6 and T7 level kyphoplasties.  She will be meeting with neurosurgery tomorrow.  She had extensive scans done.  She reports that she is tolerating her hydroxyurea therapy well with no stomach upset.  She reports she is tolerating aspirin with no bleeding, bruising, or dark stools.  Is not having any ulcers on her ankles or in her mouth.  She denies any nausea, vomiting, or diarrhea.   Overall she is willing and able to continue hydroxyurea therapy at this time.  She denies any fevers, chills, sweats, nausea, vomiting or diarrhea.  A full 10 point ROS is listed below.  MEDICAL HISTORY:  Past Medical History:  Diagnosis Date   Arthritis    COPD (chronic obstructive pulmonary disease) (HCC)    Hepatitis C    Hyperlipidemia    Hypertension    Osteoporosis    Stroke (HCC)    Thrombocytosis     SURGICAL HISTORY: Past Surgical History:  Procedure Laterality Date   CHOLECYSTECTOMY     IR ANGIO INTRA EXTRACRAN SEL INTERNAL CAROTID BILAT MOD SED  02/24/2020   IR ANGIO VERTEBRAL SEL VERTEBRAL UNI R MOD SED  02/24/2020   IR ANGIOGRAM SELECTIVE EACH ADDITIONAL VESSEL  02/24/2020   IR IVC FILTER PLMT / S&I /IMG GUID/MOD SED  02/22/2020   IR IVC FILTER RETRIEVAL / S&I /IMG GUID/MOD SED  06/27/2020   IR RADIOLOGIST EVAL & MGMT  05/23/2020   IR THROMBECT VENO MECH MOD SED  02/24/2020   IR US GUIDE VASC ACCESS RIGHT  02/24/2020   IR US GUIDE VASC ACCESS RIGHT  02/24/2020   IR VENO SAGITTAL SINUS  02/24/2020   IR VENO/JUGULAR RIGHT  02/24/2020   KYPHOPLASTY N/A 03/01/2021   Procedure: Kyphoplasty Thoracic six, Thoracic seven with spinejack;  Surgeon: Bedelia Person, MD;  Location: [facility] OR;  Service: Neurosurgery;  Laterality: N/A;   RADIOLOGY WITH ANESTHESIA N/A 02/24/2020   Procedure: IR WITH ANESTHESIA;  Surgeon: Radiologist, Medication, MD;  Location: [facility]  OR;  Service: Radiology;  Laterality: N/A;   TONSILLECTOMY      SOCIAL HISTORY: Social History   Socioeconomic History   Marital status: Married    Spouse name: Theodoro Grist   Number of children: Not on file   Years of education: Not on file   Highest education level: Not on file  Occupational History   Occupation: Retired  Tobacco Use   Smoking status: Former    Current packs/day: 0.00    Types: Cigarettes    Quit date: 2003    Years since quitting: 22.2   Smokeless tobacco: Never  Vaping Use   Vaping status: Never Used   Substance and Sexual Activity   Alcohol use: Yes    Alcohol/week: 1.0 standard drink of alcohol    Types: 1 Glasses of wine per week    Comment: nightly   Drug use: No   Sexual activity: Not on file  Other Topics Concern   Not on file  Social History Narrative   Lives with spouse   Right Handed   Drinks 2-3 cups of caffeine daily   Social Drivers of Corporate investment banker Strain: Not on file  Food Insecurity: Not on file  Transportation Needs: Not on file  Physical Activity: Not on file  Stress: Not on file  Social Connections: Not on file  Intimate Partner Violence: Not on file    FAMILY HISTORY: Family History  Problem Relation Age of Onset   Heart disease Mother    Heart disease Father     ALLERGIES:  is allergic to heparin and erythromycin.  MEDICATIONS:  Current Outpatient Medications  Medication Sig Dispense Refill   acetaminophen (TYLENOL) 500 MG tablet Take 1,000 mg by mouth every 6 (six) hours as needed for moderate pain or headache.     ADVAIR HFA 115-21 MCG/ACT inhaler Inhale 2 puffs into the lungs as needed.     amLODipine (NORVASC) 10 MG tablet Take 10 mg by mouth daily.  30 tablet 0   aspirin EC 81 MG tablet Take 1 tablet (81 mg total) by mouth daily. Swallow whole. 30 tablet 11   atorvastatin (LIPITOR) 20 MG tablet Take 1 tablet by mouth daily.     atorvastatin (LIPITOR) 40 MG tablet Take 40 mg by mouth daily.     clobetasol cream (TEMOVATE) 0.05 % APPLY A THIN LAYER TO THE AFFECTED AREA(S) TOPICALLY TWICE DAILY     Cyanocobalamin (B-12) 5000 MCG CAPS Take 5,000 mcg by mouth daily.     docusate sodium (COLACE) 100 MG capsule Take 100 mg by mouth daily.     hydroxyurea (HYDREA) 500 MG capsule TAKE 1 CAPSULE BY MOUTH ONCE DAILY WITH FOOD TO  MINIMIZE  GI  SIDE  EFFECTS 90 capsule 0   levothyroxine (SYNTHROID) 112 MCG tablet Take 112 mcg by mouth daily before breakfast.     levothyroxine (SYNTHROID) 125 MCG tablet Take 125 mcg by mouth every morning.      losartan (COZAAR) 100 MG tablet Take 100 mg by mouth daily.     losartan (COZAAR) 50 MG tablet Take 1 tablet (50 mg total) by mouth 2 (two) times daily. 60 tablet 0   pantoprazole (PROTONIX) 20 MG tablet Take 20 mg by mouth daily.     pantoprazole (PROTONIX) 40 MG tablet Take 40 mg by mouth daily.     No current facility-administered medications for this visit.    REVIEW OF SYSTEMS:   Constitutional: ( - ) fevers, ( - )  chills , ( - ) night sweats Eyes: ( - ) blurriness of vision, ( - ) double vision, ( - ) watery eyes Ears, nose, mouth, throat, and face: ( - ) mucositis, ( - ) sore throat Respiratory: ( - ) cough, ( - ) dyspnea, ( - ) wheezes Cardiovascular: ( - ) palpitation, ( - ) chest discomfort, ( - ) lower extremity swelling Gastrointestinal:  ( - ) nausea, ( - ) heartburn, ( - ) change in bowel habits Skin: ( - ) abnormal skin rashes Lymphatics: ( - ) new lymphadenopathy, ( - ) easy bruising Neurological: ( - ) numbness, ( - ) tingling, ( - ) new weaknesses Behavioral/Psych: ( - ) mood change, ( - ) new changes  All other systems were reviewed with the patient and are negative.  PHYSICAL EXAMINATION: ECOG PERFORMANCE STATUS: 0 - Asymptomatic  There were no vitals filed for this visit.    There were no vitals filed for this visit.     GENERAL: Well-appearing elderly Caucasian female, alert, no distress and comfortable SKIN: skin color, texture, turgor are normal, no rashes or significant lesions EYES: conjunctiva are pink and non-injected, sclera clear LUNGS: clear to auscultation and percussion with normal breathing effort HEART: regular rate & rhythm and no murmurs and no lower extremity edema Musculoskeletal: no cyanosis of digits and no clubbing  PSYCH: alert & oriented x 3, fluent speech NEURO: no focal motor/sensory deficits  LABORATORY DATA:  I have reviewed the data as listed    Latest Ref Rng & Units 11/19/2023   10:27 AM 08/13/2023   10:26 AM  05/06/2023   10:28 AM  CBC  WBC 4.0 - 10.5 K/uL 7.0  5.2  5.3   Hemoglobin 12.0 - 15.0 g/dL 11.9  14.7  82.9   Hematocrit 36.0 - 46.0 % 38.9  35.0  38.1   Platelets 150 - 400 K/uL 309  269  270        Latest Ref Rng & Units 01/13/2024    4:36 PM 11/19/2023   10:27 AM 08/13/2023   10:26 AM  CMP  Glucose 70 - 99 mg/dL 562  130  98   BUN 8 - 23 mg/dL 17  21  23    Creatinine 0.44 - 1.00 mg/dL 8.65  7.84  6.96   Sodium 135 - 145 mmol/L 136  143  141   Potassium 3.5 - 5.1 mmol/L 4.2  4.0  4.0   Chloride 98 - 111 mmol/L 103  107  108   CO2 22 - 32 mmol/L 26  28  27    Calcium 8.9 - 10.3 mg/dL 9.4  29.5  9.8   Total Protein 6.5 - 8.1 g/dL  7.2  7.0   Total Bilirubin 0.0 - 1.2 mg/dL  0.6  0.6   Alkaline Phos 38 - 126 U/L  67  67   AST 15 - 41 U/L  18  17   ALT 0 - 44 U/L  17  16     RADIOGRAPHIC STUDIES: No results found.  ASSESSMENT & PLAN KASEN SAKO 78 y.o. female with medical history significant for essential thrombocytosis who presents for a follow up visit.  # Essential Thrombocytosis, JAK2 Positive  -- Continue hydroxyurea 500 mg p.o. daily as well as aspirin 81 mg p.o. daily -- Labs today show white blood cell count 7.1, hemoglobin 12.6, MCV 107.4, platelets 313.  Currently blood counts are on target. -- Plan to maintain platelets between 150 and  400 -- Close monitoring for progression to myelofibrosis or acute leukemia. -- Return to clinic in 6 months time with interval 64-month labs.  # Heparin Induced Thrombocytopenia --avoid use of heparin and heparin products --recommend call to oncology if alterative anticoagulation  No orders of the defined types were placed in this encounter.   All questions were answered. The patient knows to call the clinic with any problems, questions or concerns.  A total of more than 25 minutes were spent on this encounter with face-to-face time and non-face-to-face time, including preparing to see the patient, ordering tests and/or  medications, counseling the patient and coordination of care as outlined above.   Ulysees Barns, MD Department of Hematology/Oncology [facility] Phone: [phone] Pager: [phone] Email: [email or web address]  02/19/2024 7:46 AM

## 2024-02-20 DIAGNOSIS — S22040A Wedge compression fracture of fourth thoracic vertebra, initial encounter for closed fracture: Secondary | ICD-10-CM | POA: Diagnosis not present

## 2024-02-20 DIAGNOSIS — S22040D Wedge compression fracture of fourth thoracic vertebra, subsequent encounter for fracture with routine healing: Secondary | ICD-10-CM | POA: Diagnosis not present

## 2024-02-25 ENCOUNTER — Telehealth: Payer: Self-pay | Admitting: Neurology

## 2024-02-25 NOTE — Telephone Encounter (Signed)
 Appointment details confirmed

## 2024-03-16 ENCOUNTER — Other Ambulatory Visit: Payer: Self-pay | Admitting: Hematology and Oncology

## 2024-04-08 ENCOUNTER — Ambulatory Visit: Payer: Medicare HMO | Admitting: Neurology

## 2024-04-08 ENCOUNTER — Encounter: Payer: Self-pay | Admitting: Neurology

## 2024-04-08 VITALS — BP 114/50 | HR 72 | Ht 59.5 in | Wt 156.2 lb

## 2024-04-08 DIAGNOSIS — I615 Nontraumatic intracerebral hemorrhage, intraventricular: Secondary | ICD-10-CM

## 2024-04-08 DIAGNOSIS — I69320 Aphasia following cerebral infarction: Secondary | ICD-10-CM

## 2024-04-08 DIAGNOSIS — G3184 Mild cognitive impairment, so stated: Secondary | ICD-10-CM

## 2024-04-08 NOTE — Progress Notes (Signed)
 [facility] [address] [place]. [place] [address] [phone]       OFFICE FOLLOW-UP NOTE  [name] Date of Birth:  [birth year] Medical Record Number:  [id number]   HPI: [name] is a 78 year old pleasant Caucasian lady seen today for initial office follow-up visit following hospital admission for intracerebral hemorrhage in April 2021.  She is accompanied by her husband.  History is obtained from them and review of electronic medical records and I personally reviewed imaging films in PACS. She is a 78 year old lady with past medical history of hypertension who presented to [facility] on 02/13/2020 with sudden onset of severe headache.  She was sitting in front of the computer and noticed that headache on the top of her head which is quite severe and persisted without relief.  She came to the emergency room where routine blood work revealed evidence of thrombocytosis and CT scan showed intraventricular hemorrhage.  This involves mostly the right lateral ventricle and there was changes of chronic small vessel disease.  She was kept in the intensive care unit where blood pressure was tightly controlled and follow-up CT scan showed stable appearance of the hemorrhage.  MRI scan was obtained which also showed stable intraventricular hemorrhage and severe changes of small vessel disease and multiple foci of microhemorrhages raising possibility of amyloid angiopathy versus hypertensive microangiopathy.  MRA of the brain showed no large vessel stenosis or occlusion.  2D echo showed normal ejection fraction.  LDL cholesterol 92 mg percent.  Hemoglobin A1c was 6.1.  Initially the etiology of the hemorrhage was thought to be hypertensive but subsequently patient's showed neurological worsening and she developed new left temporal occipital hemorrhage.  MR venogram on 02/25/2020 raise possibility of deep cerebral venous thrombosis.  Patient became obtunded and less  responsive.  She started hypertonic saline note back to the ICU however she did not get better.  Subsequently a cerebral catheter angiogram was done 03/01/2020 by Dr. Johnna Nakai and after consent she did mechanical thrombectomy with recanalization of the vein of Galen and straight sinus with decreased clot burden in the torcula right transverse and sigmoid sinuses.  A residual nonocclusive clot was seen in the vein of Galen.  Patient had presented with thrombocytosis and she was diagnosed with polycythemia and followed by the hematology team and she underwent a pheresis.  She is started on hydroxyurea  subsequently she developed thrombocytopenia and lab work suggested heparin -induced thrombocytopenia.  She was initially started on IV heparin  for her venous sinus thrombosis but this was subsequently discontinued and she was started on IV argatroban  which was continued till she was transferred to rehab and was able to swallow then she was switched to Pradaxa .  Patient spent 3 weeks in rehab and is gradually improved.  His speech and language are improved.  She is living at home with his husband.  She still has some word finding difficulties and occasional paraphasic errors but she is still getting ongoing speech therapy which is helpful.  She has no physical weakness and gait or balance problems.  Her platelet count is been followed regularly by Dr. Charolett Copes hematologist and she has recently been started back on hydroxyurea  and her diagnosis is no change to essential thrombocytosis rather than polycythemia vera.  Patient's blood pressure has been running quite low with systolic in the 80s and 90s recently.  She is on Lasix , hydralazine , Norvasc .  She is having cognitive and memory issues but otherwise is doing fine. Update 08/30/2020 :  She returns for follow-up after last visit 4 months ago.  She is accompanied by her husband.  She states she is doing well.  Her speech has improved and speech therapy plans to  discharge her next week.  She is taking much better.  Her short-term memory is improving but is still not back to normal.  She has good days and bad days.  She has difficulty remembering names.  She is mostly independent in most activities for daily living with her husband does not live alone particularly when she is cooking.  Short-term memory remains poor.  She is tolerating Xarelto  well without bleeding or bruising.  Blood pressures well controlled and she has discontinued Lasix  and hydralazine .  Blood pressure today is 129/70.  She has no new complaints.  Her IVC filter was removed by Dr. Mabel Savage on 06/27/2020. Update 03/14/2021 : She returns for follow-up after last visit 6 months ago.  She is accompanied by her husband.  Patient continues to do well and has not had any recurrent stroke or TIA symptoms.  She remains on Xarelto  which is tolerating well without bleeding or bruising.  However she fell at home on 12/15/2020 and suffered 2 compression fractures.  She underwent kyphoplasty and T7 by Dr. Arvilla Birmingham.  She continues to have mild short-term memory difficulties which appear to be unchanged though she did better on objective testing in the office today.  She has easy distractibility and trouble with multitasking.  She continues to follow-up with Dr. Magrinat for a polycythemia which appears to be quite well controlled on hydroxyurea  and last hematocrit on 02/21/2021 was 34.9.  She has no new complaints today.  Update 03/14/2022 : She returns for follow-up after last visit a year ago.  She is accompanied by husband.  They feel her memory difficulties are about unchanged.  She continues to have short-term memory difficulties but it is not getting progressive.  She is still mostly independent in most activities of daily living.  Does do crossword puzzles and mentally challenging activities.  I had advised the patient get a follow-up MRI and MRV at last visit but for unclear reason that has not yet been done.   She remains on aspirin  which is tolerating well without bruising or bleeding.  Her blood pressures under good control today  it is 129/75.  She is tolerating Lipitor well without muscle aches and pains. Update 03/27/2023 : She returns for follow-up after last visit a year ago.  She is accompanied by her husband.  She states she is doing well.  They feel her memory may be slightly better.  She does play games and does crossword puzzles regularly.  She stopped taking Prevagen as she felt it was making no difference.  She is Independent in activities of daily living.  She remains on aspirin  which she is tolerating well without bruising or bleeding.  Her blood pressure is under good control.  She is tolerating Lipitor well without side effects.  On Mini-Mental status exam testing today she scored 27/30 which is stable from last visit.  She had MRI scan of the brain on 03/29/2022 which showed old right posterior temporal infarct and chronic changes of small vessel disease and mild atrophy.  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 Update 04/08/2024 : She returns for follow-up after last visit a year ago.  She is accompanied by her husband.  She states she is doing  good.  She states that her speech has improved significantly.  She still has some trouble with names at times and can get stuck occasionally but she can speak fluent sentences most of the time.  She has occasional word finding difficulties.  She states her memory is quite good.  She is mostly independent in all activities of daily living.  She states her blood pressure is quite good in fact today it is quite low.  Has regular follow-ups with hematologist Dr. Rosaline Coma for her essential thrombocytosis which appears well-controlled on hydroxyurea .  She has no new complaints today.  On MMSE today she scored 23/30. ROS:   14 system review of systems is positive for  memory  loss, speech difficulties, headache, confusion and all other systems negative  PMH:  Past Medical History:  Diagnosis Date   Arthritis    COPD (chronic obstructive pulmonary disease) (HCC)    Hepatitis C    Hyperlipidemia    Hypertension    Osteoporosis    Stroke (HCC)    Thrombocytosis     Social History:  Social History   Socioeconomic History   Marital status: Married    Spouse name: Chalice Colt   Number of children: Not on file   Years of education: Not on file   Highest education level: Not on file  Occupational History   Occupation: Retired  Tobacco Use   Smoking status: Former    Current packs/day: 0.00    Types: Cigarettes    Quit date: 2003    Years since quitting: 22.4   Smokeless tobacco: Never  Vaping Use   Vaping status: Never Used  Substance and Sexual Activity   Alcohol use: Yes    Alcohol/week: 14.0 standard drinks of alcohol    Types: 14 Glasses of wine per week    Comment: nightly   Drug use: No   Sexual activity: Not on file  Other Topics Concern   Not on file  Social History Narrative   Lives with spouse   Right Handed   Drinks 2-3 cups of caffeine  daily   Social Drivers of Corporate investment banker Strain: Not on file  Food Insecurity: Not on file  Transportation Needs: Not on file  Physical Activity: Not on file  Stress: Not on file  Social Connections: Not on file  Intimate Partner Violence: Not on file    Medications:   Current Outpatient Medications on File Prior to Visit  Medication Sig Dispense Refill   acetaminophen  (TYLENOL ) 500 MG tablet Take 1,000 mg by mouth every 6 (six) hours as needed for moderate pain or headache.     ADVAIR HFA 115-21 MCG/ACT inhaler Inhale 2 puffs into the lungs as needed.     amLODipine  (NORVASC ) 10 MG tablet Take 10 mg by mouth daily.  30 tablet 0   aspirin  EC 81 MG tablet Take 1 tablet (81 mg total) by mouth daily. Swallow whole. 30 tablet 11   atorvastatin  (LIPITOR) 40 MG tablet Take 40 mg by  mouth daily.     clobetasol  cream (TEMOVATE ) 0.05 % APPLY A THIN LAYER TO THE AFFECTED AREA(S) TOPICALLY TWICE DAILY     Cyanocobalamin  (B-12) 5000 MCG CAPS Take 5,000 mcg by mouth daily.     docusate sodium  (COLACE) 100 MG capsule Take 100 mg by mouth daily.     hydroxyurea  (HYDREA ) 500 MG capsule TAKE 1 CAPSULE BY MOUTH ONCE DAILY WITH FOOD TO  MINIMIZE  GI  SIDE  EFFECTS 90 capsule 0  levothyroxine  (SYNTHROID ) 112 MCG tablet Take 112 mcg by mouth daily before breakfast. Whole tablet 6 days, 1.5 tabs 1 day     losartan  (COZAAR ) 100 MG tablet Take 100 mg by mouth daily.     pantoprazole  (PROTONIX ) 40 MG tablet Take 40 mg by mouth daily.     No current facility-administered medications on file prior to visit.    Allergies:   Allergies  Allergen Reactions   Heparin  Other (See Comments)    4/15 HIT OD 2.553, SRA negative > treated as true HIT   Amoxicillin -Pot Clavulanate Other (See Comments)    Unknown reaction   Erythromycin Diarrhea, Itching and Other (See Comments)    Severe stomach pains    Physical Exam General: well developed, well nourished pleasant elderly Caucasian lady seated, in no evident distress Head: head normocephalic and atraumatic.  Neck: supple with no carotid or supraclavicular bruits Cardiovascular: regular rate and rhythm, no murmurs Musculoskeletal: no deformity Skin:  no rash/petichiae Vascular:  Normal pulses all extremities Vitals:   04/08/24 1315  BP: (!) 114/50  Pulse: 72   Neurologic Exam Mental Status: Awake and fully alert. Oriented to place and time. Recent and remote memory poor. Attention span, concentration and fund of knowledge diminished. Mood and affect appropriate.  Diminished recall 1/3.  Able to name 11 animals that can walk on 4 legs.  Clock drawing 4 /4.  Speech seems mostly fluent though slightly hesitant but no paraphasic errors.  Slightly impaired  naming  but good repetition comprehension. Cranial Nerves: Fundoscopic exam not done.  Pupils equal, briskly reactive to light. Extraocular movements full without nystagmus. Visual fields full to confrontation. Hearing intact. Facial sensation intact. Face, tongue, palate moves normally and symmetrically.  Motor: Normal bulk and tone. Normal strength in all tested extremity muscles. Sensory.: intact to touch ,pinprick .position and vibratory sensation.  Coordination: Rapid alternating movements normal in all extremities. Finger-to-nose and heel-to-shin performed accurately bilaterally. Gait and Station: Arises from chair without difficulty. Stance is normal. Gait demonstrates normal stride length and balance . Able to heel, toe and tandem walk with mild difficulty.  Reflexes: 1+ and symmetric. Toes downgoing.      04/08/2024    1:18 PM 03/27/2023    1:00 PM 03/14/2022    2:07 PM  MMSE - Mini Mental State Exam  Orientation to time 3 5 5   Orientation to Place 4 5 5   Registration 3 3 3   Attention/ Calculation 4 2 3   Recall 1 3 1   Language- name 2 objects 2 2 2   Language- repeat 1 1 1   Language- follow 3 step command 3 3 3   Language- read & follow direction 1 1 1   Write a sentence 0 1 1  Copy design 1 1 1   Total score 23 27 26       ASSESSMENT: 78 year old Caucasian lady with intraventricular hemorrhage due to deep cerebral venous sinus thrombosis likely related to polycythemia treated with mechanical thrombectomy and prolonged hospital course complicated by bilateral pulmonary embolism, heparin -induced thrombocytopenia and anemia.  Patient is doing relatively well now and her mild aphasia seems to have improved and cognitive impairment which appears unchanged     PLAN: I had a long discussion with the patient and her husband regarding her history of remote cerebral venous sinus thrombosis and she is done extremely well. Continue aspirin  81 mg daily for stroke prevention and maintain aggressive risk factor modification.     She also has mild cognitive impairment but is  otherwise  doing extremely well.  I recommend she increase participation in mentally challenging activities like solving crossword puzzles, playing bridge and sodoku. .  We also discussed memory compensation strategies.  She will return for follow-up with me in the future in a year or call earlier if necessary. Greater than 50% of time during this 35 minute visit was spent on counseling,explanation of diagnosis of , planning of further management of cerebral venous sinus thrombosis and mild cognitive impairment, discussion with patient and family and coordination of care Ardella Beaver, MD  [facility] [address] [place], [place] [address]  Phone [phone] Fax [phone] Note: This document was prepared with digital dictation and possible smart phrase technology. Any transcriptional errors that result from this process are unintentional

## 2024-04-08 NOTE — Patient Instructions (Signed)
 I had a long discussion with the patient and her husband regarding her history of remote cerebral venous sinus thrombosis and she is done extremely well. Continue aspirin  81 mg daily for stroke prevention and maintain aggressive risk factor modification.     She also has mild cognitive impairment but is otherwise doing extremely well.  I recommend she increase participation in mentally challenging activities like solving crossword puzzles, playing bridge and sodoku. .  We also discussed memory compensation strategies.  She will return for follow-up with me in the future in a year or call earlier if necessary.

## 2024-04-13 ENCOUNTER — Other Ambulatory Visit: Payer: Self-pay | Admitting: *Deleted

## 2024-04-13 DIAGNOSIS — R109 Unspecified abdominal pain: Secondary | ICD-10-CM | POA: Diagnosis not present

## 2024-04-13 DIAGNOSIS — I10 Essential (primary) hypertension: Secondary | ICD-10-CM | POA: Diagnosis not present

## 2024-04-13 DIAGNOSIS — M79606 Pain in leg, unspecified: Secondary | ICD-10-CM

## 2024-04-14 ENCOUNTER — Other Ambulatory Visit (HOSPITAL_COMMUNITY): Payer: Self-pay | Admitting: Family Medicine

## 2024-04-14 DIAGNOSIS — I739 Peripheral vascular disease, unspecified: Secondary | ICD-10-CM

## 2024-04-16 ENCOUNTER — Ambulatory Visit (HOSPITAL_COMMUNITY)
Admission: RE | Admit: 2024-04-16 | Discharge: 2024-04-16 | Disposition: A | Discharge status: Home / Self Care | Source: Ambulatory Visit | Attending: Vascular Surgery | Admitting: Vascular Surgery

## 2024-04-16 ENCOUNTER — Encounter (HOSPITAL_COMMUNITY): Payer: Self-pay

## 2024-04-16 DIAGNOSIS — M79606 Pain in leg, unspecified: Secondary | ICD-10-CM | POA: Insufficient documentation

## 2024-04-16 DIAGNOSIS — I739 Peripheral vascular disease, unspecified: Secondary | ICD-10-CM

## 2024-04-16 LAB — VAS US LOWER EXTREMITY EXERCISE
Left ABI: 1.12
Right ABI: 1.18

## 2024-04-27 NOTE — Progress Notes (Unsigned)
 Office Note     CC:  Claudication requesting exercise ABI Requesting Provider:  Helyn Lobstein, MD  HPI: [name] is a 78 y.o. (1946-05-16) female presenting at the request of .Helyn Lobstein, MD with concerns for claudication.  On exam, [name] was doing well, accompanied by her husband.  A native of [place], she graduated from [employer].  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  Over the last several months, she is appreciated some instances of waxing waning swelling in the lower extremities, most notably at the ankles.  While not significant, she describes a shiny appearance to the skin.  She notes that her activity level has been lower than it was roughly 1 year ago, and her and her husband are working to ambulate more.  Denies edema on the dorsal aspect of the foot.  States sometimes her calves feel tight by the end of the day.  Denies symptoms of claudication, ischemic rest pain, tissue loss.  Past Medical History:  Diagnosis Date   Arthritis    COPD (chronic obstructive pulmonary disease) (HCC)    Hepatitis C    Hyperlipidemia    Hypertension    Osteoporosis    Stroke (HCC)    Thrombocytosis     Past Surgical History:  Procedure Laterality Date   CHOLECYSTECTOMY     Implanted device for bladder control N/A 12/17/2022   IR ANGIO INTRA EXTRACRAN SEL INTERNAL CAROTID BILAT MOD SED  02/24/2020   IR ANGIO VERTEBRAL SEL VERTEBRAL UNI R MOD SED  02/24/2020   IR ANGIOGRAM SELECTIVE EACH ADDITIONAL VESSEL  02/24/2020   IR IVC FILTER PLMT / S&I /IMG GUID/MOD SED  02/22/2020   IR IVC FILTER RETRIEVAL / S&I /IMG GUID/MOD SED  06/27/2020   IR RADIOLOGIST EVAL & MGMT  05/23/2020   IR THROMBECT VENO MECH MOD SED  02/24/2020   IR US  GUIDE VASC ACCESS RIGHT  02/24/2020   IR US  GUIDE VASC ACCESS RIGHT  02/24/2020   IR VENO SAGITTAL SINUS  02/24/2020   IR VENO/JUGULAR RIGHT  02/24/2020   KYPHOPLASTY N/A 03/01/2021   Procedure: Kyphoplasty Thoracic  six, Thoracic seven with spinejack;  Surgeon: Van Gelinas, MD;  Location: [facility] OR;  Service: Neurosurgery;  Laterality: N/A;   RADIOLOGY WITH ANESTHESIA N/A 02/24/2020   Procedure: IR WITH ANESTHESIA;  Surgeon: Radiologist, Medication, MD;  Location: [facility] OR;  Service: Radiology;  Laterality: N/A;   TONSILLECTOMY      Social History   Socioeconomic History   Marital status: Married    Spouse name: Chalice Colt   Number of children: Not on file   Years of education: Not on file   Highest education level: Not on file  Occupational History   Occupation: Retired  Tobacco Use   Smoking status: Former    Current packs/day: 0.00    Types: Cigarettes    Quit date: 2003    Years since quitting: 22.4   Smokeless tobacco: Never  Vaping Use   Vaping status: Never Used  Substance and Sexual Activity   Alcohol use: Yes    Alcohol/week: 14.0 standard drinks of alcohol    Types: 14 Glasses of wine per week    Comment: nightly   Drug use: No   Sexual activity: Not on file  Other Topics Concern   Not on file  Social History Narrative   Lives with spouse   Right Handed   Drinks 2-3 cups of caffeine  daily   Social Drivers of Health  Financial Resource Strain: Not on file  Food Insecurity: Not on file  Transportation Needs: Not on file  Physical Activity: Not on file  Stress: Not on file  Social Connections: Not on file  Intimate Partner Violence: Not on file   Family History  Problem Relation Age of Onset   Heart disease Mother    Heart disease Father     Current Outpatient Medications  Medication Sig Dispense Refill   acetaminophen  (TYLENOL ) 500 MG tablet Take 1,000 mg by mouth every 6 (six) hours as needed for moderate pain or headache.     ADVAIR HFA 115-21 MCG/ACT inhaler Inhale 2 puffs into the lungs as needed.     amLODipine  (NORVASC ) 10 MG tablet Take 10 mg by mouth daily.  30 tablet 0   aspirin  EC 81 MG tablet Take 1 tablet (81 mg total) by mouth daily. Swallow whole. 30  tablet 11   atorvastatin  (LIPITOR) 40 MG tablet Take 40 mg by mouth daily.     clobetasol  cream (TEMOVATE ) 0.05 % APPLY A THIN LAYER TO THE AFFECTED AREA(S) TOPICALLY TWICE DAILY     Cyanocobalamin  (B-12) 5000 MCG CAPS Take 5,000 mcg by mouth daily.     docusate sodium  (COLACE) 100 MG capsule Take 100 mg by mouth daily.     hydroxyurea  (HYDREA ) 500 MG capsule TAKE 1 CAPSULE BY MOUTH ONCE DAILY WITH FOOD TO  MINIMIZE  GI  SIDE  EFFECTS 90 capsule 0   levothyroxine  (SYNTHROID ) 112 MCG tablet Take 112 mcg by mouth daily before breakfast. Whole tablet 6 days, 1.5 tabs 1 day     losartan  (COZAAR ) 100 MG tablet Take 100 mg by mouth daily.     pantoprazole  (PROTONIX ) 40 MG tablet Take 40 mg by mouth daily.     No current facility-administered medications for this visit.    Allergies  Allergen Reactions   Heparin  Other (See Comments)    4/15 HIT OD 2.553, SRA negative > treated as true HIT   Amoxicillin -Pot Clavulanate Other (See Comments)    Unknown reaction   Erythromycin Diarrhea, Itching and Other (See Comments)    Severe stomach pains     REVIEW OF SYSTEMS:  [X]  denotes positive finding, [ ]  denotes negative finding Cardiac  Comments:  Chest pain or chest pressure:    Shortness of breath upon exertion:    Short of breath when lying flat:    Irregular heart rhythm:        Vascular    Pain in calf, thigh, or hip brought on by ambulation:    Pain in feet at night that wakes you up from your sleep:     Blood clot in your veins:    Leg swelling:  X       Pulmonary    Oxygen at home:    Productive cough:     Wheezing:         Neurologic    Sudden weakness in arms or legs:     Sudden numbness in arms or legs:     Sudden onset of difficulty speaking or slurred speech:    Temporary loss of vision in one eye:     Problems with dizziness:         Gastrointestinal    Blood in stool:     Vomited blood:         Genitourinary    Burning when urinating:     Blood in urine:  Psychiatric    Major depression:         Hematologic    Bleeding problems:    Problems with blood clotting too easily:        Skin    Rashes or ulcers:        Constitutional    Fever or chills:      PHYSICAL EXAMINATION:  There were no vitals filed for this visit.  General:  WDWN in NAD; vital signs documented above Gait: Not observed HENT: WNL, normocephalic Pulmonary: normal non-labored breathing , without wheezing Cardiac: regular HR Abdomen: soft, NT, no masses Skin: without rashes Vascular Exam/Pulses:  Right Left  Radial 2+ (normal) 2+ (normal)  Ulnar    Femoral    Popliteal    DP 2+ (normal) 2+ (normal)  PT     Extremities: without ischemic changes, without Gangrene , without cellulitis; without open wounds;  Musculoskeletal: no muscle wasting or atrophy  Neurologic: A&O X 3;  No focal weakness or paresthesias are detected Psychiatric:  The pt has Normal affect.   Non-Invasive Vascular Imaging:    Exercise ABI reviewed, and while abnormal, this is due to an increased brachial artery pressure, not so much change in the pressure in the lower extremities. Prior CT was also reviewed from 01/13/2024 demonstrating mild atherosclerotic inflow disease.  No flow-limiting stenosis identified.    ASSESSMENT/PLAN: [name] is a 78 y.o. female presenting with intermittent bilateral lower extremity edema.  Per patient and her husband, the edema is mild, however they want to be proactive.  On physical exam, she had palpable pulses bilaterally.  Very little, if not any edema in the lower extremities at the level of the ankle.  No chronic skin changes.  ABI was reviewed and found to be normal.  Exercise ABI was abnormal, however this is due to an increased brachial artery pressure, and not major changes in the lower extremity.  I reviewed CT scan from 01/13/2024 demonstrating mild atherosclerotic inflow disease, however no flow-limiting stenosis was identified.  I had  a long conversation with Jamisen regarding the above.  I think that the intermittent swelling she appreciates in the lower extremities likely due to venous disease.  We discussed that conservative therapy for venous disease includes compression, elevation, and exercise.  She was not interested in compression stockings at this time, but was amenable to elevation and increased exercise.  We also discussed lower extremity reflux study.  She elected to hold off at this time.  Should swelling worsen, I am happy to see her back and order a reflux study.  Kayla Part, MD [facility] [phone]

## 2024-04-28 DIAGNOSIS — S22040D Wedge compression fracture of fourth thoracic vertebra, subsequent encounter for fracture with routine healing: Secondary | ICD-10-CM | POA: Diagnosis not present

## 2024-04-29 ENCOUNTER — Encounter: Payer: Self-pay | Admitting: Vascular Surgery

## 2024-04-29 ENCOUNTER — Ambulatory Visit: Attending: Vascular Surgery | Admitting: Vascular Surgery

## 2024-04-29 VITALS — BP 127/74 | HR 76 | Temp 97.9°F | Resp 18 | Ht 59.5 in | Wt 154.5 lb

## 2024-04-29 DIAGNOSIS — R600 Localized edema: Secondary | ICD-10-CM

## 2024-05-04 DIAGNOSIS — N1831 Chronic kidney disease, stage 3a: Secondary | ICD-10-CM | POA: Diagnosis not present

## 2024-05-04 DIAGNOSIS — I10 Essential (primary) hypertension: Secondary | ICD-10-CM | POA: Diagnosis not present

## 2024-05-19 ENCOUNTER — Inpatient Hospital Stay: Attending: Hematology and Oncology

## 2024-05-19 ENCOUNTER — Other Ambulatory Visit: Payer: Self-pay | Admitting: Hematology and Oncology

## 2024-05-19 DIAGNOSIS — D473 Essential (hemorrhagic) thrombocythemia: Secondary | ICD-10-CM | POA: Insufficient documentation

## 2024-05-19 DIAGNOSIS — Z79899 Other long term (current) drug therapy: Secondary | ICD-10-CM | POA: Diagnosis not present

## 2024-05-19 LAB — CMP (CANCER CENTER ONLY)
ALT: 20 U/L (ref 0–44)
AST: 20 U/L (ref 15–41)
Albumin: 4.1 g/dL (ref 3.5–5.0)
Alkaline Phosphatase: 65 U/L (ref 38–126)
Anion gap: 6 (ref 5–15)
BUN: 19 mg/dL (ref 8–23)
CO2: 28 mmol/L (ref 22–32)
Calcium: 9.8 mg/dL (ref 8.9–10.3)
Chloride: 108 mmol/L (ref 98–111)
Creatinine: 1.22 mg/dL — ABNORMAL HIGH (ref 0.44–1.00)
GFR, Estimated: 46 mL/min — ABNORMAL LOW (ref >60)
Glucose, Bld: 115 mg/dL — ABNORMAL HIGH (ref 70–99)
Potassium: 3.8 mmol/L (ref 3.5–5.1)
Sodium: 142 mmol/L (ref 135–145)
Total Bilirubin: 0.6 mg/dL (ref 0.0–1.2)
Total Protein: 6.6 g/dL (ref 6.5–8.1)

## 2024-05-19 LAB — CBC WITH DIFFERENTIAL (CANCER CENTER ONLY)
Abs Immature Granulocytes: 0.02 K/uL (ref 0.00–0.07)
Basophils Absolute: 0 K/uL (ref 0.0–0.1)
Basophils Relative: 1 %
Eosinophils Absolute: 0.1 K/uL (ref 0.0–0.5)
Eosinophils Relative: 1 %
HCT: 37.5 % (ref 36.0–46.0)
Hemoglobin: 12.6 g/dL (ref 12.0–15.0)
Immature Granulocytes: 0 %
Lymphocytes Relative: 24 %
Lymphs Abs: 1.2 K/uL (ref 0.7–4.0)
MCH: 35.9 pg — ABNORMAL HIGH (ref 26.0–34.0)
MCHC: 33.6 g/dL (ref 30.0–36.0)
MCV: 106.8 fL — ABNORMAL HIGH (ref 80.0–100.0)
Monocytes Absolute: 0.5 K/uL (ref 0.1–1.0)
Monocytes Relative: 9 %
Neutro Abs: 3.4 K/uL (ref 1.7–7.7)
Neutrophils Relative %: 65 %
Platelet Count: 281 K/uL (ref 150–400)
RBC: 3.51 MIL/uL — ABNORMAL LOW (ref 3.87–5.11)
RDW: 12.8 % (ref 11.5–15.5)
WBC Count: 5.2 K/uL (ref 4.0–10.5)
nRBC: 0 % (ref 0.0–0.2)

## 2024-06-12 ENCOUNTER — Other Ambulatory Visit: Payer: Self-pay | Admitting: Hematology and Oncology

## 2024-06-22 DIAGNOSIS — E782 Mixed hyperlipidemia: Secondary | ICD-10-CM | POA: Diagnosis not present

## 2024-06-28 DIAGNOSIS — H52203 Unspecified astigmatism, bilateral: Secondary | ICD-10-CM | POA: Diagnosis not present

## 2024-06-28 DIAGNOSIS — H25013 Cortical age-related cataract, bilateral: Secondary | ICD-10-CM | POA: Diagnosis not present

## 2024-06-28 DIAGNOSIS — H2513 Age-related nuclear cataract, bilateral: Secondary | ICD-10-CM | POA: Diagnosis not present

## 2024-07-28 DIAGNOSIS — H2513 Age-related nuclear cataract, bilateral: Secondary | ICD-10-CM | POA: Diagnosis not present

## 2024-08-02 ENCOUNTER — Telehealth: Payer: Self-pay | Admitting: *Deleted

## 2024-08-02 NOTE — Telephone Encounter (Signed)
 Received call from pt requesting to reschedule her appt on 08/18/24 to the following. Advised that I would send a message to our scheduler. Pt voiced understanding.

## 2024-08-18 ENCOUNTER — Other Ambulatory Visit

## 2024-08-18 ENCOUNTER — Ambulatory Visit: Admitting: Hematology and Oncology

## 2024-08-27 ENCOUNTER — Inpatient Hospital Stay: Admitting: Hematology and Oncology

## 2024-08-27 ENCOUNTER — Inpatient Hospital Stay: Attending: Hematology and Oncology

## 2024-08-27 VITALS — BP 110/70 | HR 76 | Temp 98.2°F | Resp 15 | Wt 153.3 lb

## 2024-08-27 DIAGNOSIS — D473 Essential (hemorrhagic) thrombocythemia: Secondary | ICD-10-CM

## 2024-08-27 DIAGNOSIS — Z87891 Personal history of nicotine dependence: Secondary | ICD-10-CM | POA: Insufficient documentation

## 2024-08-27 LAB — CBC WITH DIFFERENTIAL (CANCER CENTER ONLY)
Abs Immature Granulocytes: 0.02 K/uL (ref 0.00–0.07)
Basophils Absolute: 0 K/uL (ref 0.0–0.1)
Basophils Relative: 1 %
Eosinophils Absolute: 0.1 K/uL (ref 0.0–0.5)
Eosinophils Relative: 2 %
HCT: 36 % (ref 36.0–46.0)
Hemoglobin: 12.3 g/dL (ref 12.0–15.0)
Immature Granulocytes: 0 %
Lymphocytes Relative: 11 %
Lymphs Abs: 0.7 K/uL (ref 0.7–4.0)
MCH: 36.3 pg — ABNORMAL HIGH (ref 26.0–34.0)
MCHC: 34.2 g/dL (ref 30.0–36.0)
MCV: 106.2 fL — ABNORMAL HIGH (ref 80.0–100.0)
Monocytes Absolute: 0.5 K/uL (ref 0.1–1.0)
Monocytes Relative: 9 %
Neutro Abs: 4.7 K/uL (ref 1.7–7.7)
Neutrophils Relative %: 77 %
Platelet Count: 288 K/uL (ref 150–400)
RBC: 3.39 MIL/uL — ABNORMAL LOW (ref 3.87–5.11)
RDW: 13.2 % (ref 11.5–15.5)
WBC Count: 6.1 K/uL (ref 4.0–10.5)
nRBC: 0 % (ref 0.0–0.2)

## 2024-08-27 LAB — CMP (CANCER CENTER ONLY)
ALT: 22 U/L (ref 0–44)
AST: 19 U/L (ref 15–41)
Albumin: 4.2 g/dL (ref 3.5–5.0)
Alkaline Phosphatase: 66 U/L (ref 38–126)
Anion gap: 6 (ref 5–15)
BUN: 19 mg/dL (ref 8–23)
CO2: 27 mmol/L (ref 22–32)
Calcium: 9.9 mg/dL (ref 8.9–10.3)
Chloride: 109 mmol/L (ref 98–111)
Creatinine: 1.12 mg/dL — ABNORMAL HIGH (ref 0.44–1.00)
GFR, Estimated: 50 mL/min — ABNORMAL LOW (ref >60)
Glucose, Bld: 90 mg/dL (ref 70–99)
Potassium: 4.3 mmol/L (ref 3.5–5.1)
Sodium: 142 mmol/L (ref 135–145)
Total Bilirubin: 0.5 mg/dL (ref 0.0–1.2)
Total Protein: 6.7 g/dL (ref 6.5–8.1)

## 2024-08-27 NOTE — Progress Notes (Signed)
 [facility] [phone]   [phone]  PROGRESS NOTE  Patient Care Team: Aisha Harvey, MD as PCP - General (Family Medicine) Lonni Slain, MD as PCP - Cardiology (Cardiology) Leva Rush, MD as Consulting Physician (Obstetrics and Gynecology) Rosemarie Eather RAMAN, MD as Consulting Physician (Neurology)  Hematological/Oncological History # Essential Thrombocytosis (a) JAK2 mutation positive  (b) bone marrow biopsy 02/13/2020 shows no evidence of leukemia (c)  leukapheresis x3 . Last leukapheresis Friday 02/18/2020   (i) HD catheter removed after pheresis on  02/18/19 (d) hydrea  500 mg daily to keep HCT <45 and platelets in normal range (e) Hydrea  dose increased to 1000 mg daily Apr 07, 2020--not tolerated (f) Hydrea  resumed at 500 mg daily June 2021 (g) 08/05/2022: transition care to Dr. Federico due to Dr. Tauna retirement.   Interval History:  [name] 78 y.o. female with medical history significant for essential thrombocytosis who presents for a follow up visit. The patient's last visit was on 02/19/2024. In the interim since the last visit she has had no major changes in her health.  On exam today [name] is accompanied by her husband.  She reports she has been well overall interim since her last visit.  She reports she is currently taking her 500 mg of hydroxyurea  daily as well as 81 mg p.o. aspirin .  She notes that she has been okay.  She has had no major changes in her health and no hospitalizations.  She did recently start a new cholesterol medication.  She notes her energy today is about a 6 out of 10.  Her appetite remains strong.  Her hydroxyurea  is not causing any nausea, vomiting, or diarrhea.  She denies any ulcers in the mouth or ankles.  She reports the aspirin  is not causing any bleeding, bruising, or dark stools.  She does not have any signs or symptoms concerning for blood clot such as leg pain, leg swelling, chest  pain, or shortness of breath.  She did receive her yearly flu and COVID shots.  Overall she feels quite well and has no questions concerns or complaints today.  A full 10 point ROS otherwise negative.  MEDICAL HISTORY:  Past Medical History:  Diagnosis Date   Arthritis    COPD (chronic obstructive pulmonary disease) (HCC)    Hepatitis C    Hyperlipidemia    Hypertension    Osteoporosis    Stroke (HCC)    Thrombocytosis     SURGICAL HISTORY: Past Surgical History:  Procedure Laterality Date   CHOLECYSTECTOMY     Implanted device for bladder control N/A 12/17/2022   IR ANGIO INTRA EXTRACRAN SEL INTERNAL CAROTID BILAT MOD SED  02/24/2020   IR ANGIO VERTEBRAL SEL VERTEBRAL UNI R MOD SED  02/24/2020   IR ANGIOGRAM SELECTIVE EACH ADDITIONAL VESSEL  02/24/2020   IR IVC FILTER PLMT / S&I /IMG GUID/MOD SED  02/22/2020   IR IVC FILTER RETRIEVAL / S&I /IMG GUID/MOD SED  06/27/2020   IR RADIOLOGIST EVAL & MGMT  05/23/2020   IR THROMBECT VENO MECH MOD SED  02/24/2020   IR US  GUIDE VASC ACCESS RIGHT  02/24/2020   IR US  GUIDE VASC ACCESS RIGHT  02/24/2020   IR VENO SAGITTAL SINUS  02/24/2020   IR VENO/JUGULAR RIGHT  02/24/2020   KYPHOPLASTY N/A 03/01/2021   Procedure: Kyphoplasty Thoracic six, Thoracic seven with spinejack;  Surgeon: Debby Dorn MATSU, MD;  Location: [facility] OR;  Service: Neurosurgery;  Laterality: N/A;   RADIOLOGY WITH ANESTHESIA  N/A 02/24/2020   Procedure: IR WITH ANESTHESIA;  Surgeon: Radiologist, Medication, MD;  Location: [facility] OR;  Service: Radiology;  Laterality: N/A;   TONSILLECTOMY      SOCIAL HISTORY: Social History   Socioeconomic History   Marital status: Married    Spouse name: [name]   Number of children: Not on file   Years of education: Not on file   Highest education level: Not on file  Occupational History   Occupation: Retired  Tobacco Use   Smoking status: Former    Current packs/day: 0.00    Types: Cigarettes    Quit date: 2003    Years since  quitting: 22.8   Smokeless tobacco: Never  Vaping Use   Vaping status: Never Used  Substance and Sexual Activity   Alcohol use: Yes    Alcohol/week: 14.0 standard drinks of alcohol    Types: 14 Glasses of wine per week    Comment: nightly   Drug use: No   Sexual activity: Not on file  Other Topics Concern   Not on file  Social History Narrative   Lives with spouse   Right Handed   Drinks 2-3 cups of caffeine  daily   Social Drivers of Corporate Investment Banker Strain: Not on file  Food Insecurity: Not on file  Transportation Needs: Not on file  Physical Activity: Not on file  Stress: Not on file  Social Connections: Not on file  Intimate Partner Violence: Not on file    FAMILY HISTORY: Family History  Problem Relation Age of Onset   Heart disease Mother    Heart disease Father     ALLERGIES:  is allergic to heparin , amoxicillin -pot clavulanate, and erythromycin.  MEDICATIONS:  Current Outpatient Medications  Medication Sig Dispense Refill   acetaminophen  (TYLENOL ) 500 MG tablet Take 1,000 mg by mouth every 6 (six) hours as needed for moderate pain or headache.     ADVAIR HFA 115-21 MCG/ACT inhaler Inhale 2 puffs into the lungs as needed.     amLODipine  (NORVASC ) 10 MG tablet Take 10 mg by mouth daily.  30 tablet 0   aspirin  EC 81 MG tablet Take 1 tablet (81 mg total) by mouth daily. Swallow whole. 30 tablet 11   atorvastatin  (LIPITOR) 40 MG tablet Take 40 mg by mouth daily.     clobetasol  cream (TEMOVATE ) 0.05 % APPLY A THIN LAYER TO THE AFFECTED AREA(S) TOPICALLY TWICE DAILY     Cyanocobalamin  (B-12) 5000 MCG CAPS Take 5,000 mcg by mouth daily.     docusate sodium  (COLACE) 100 MG capsule Take 100 mg by mouth daily.     hydroxyurea  (HYDREA ) 500 MG capsule TAKE 1 CAPSULE BY MOUTH ONCE DAILY WITH FOOD TO  MINIMIZE  GI  SIDE  EFFECT 90 capsule 0   levothyroxine  (SYNTHROID ) 112 MCG tablet Take 112 mcg by mouth daily before breakfast. Whole tablet 6 days, 1.5 tabs 1 day      losartan  (COZAAR ) 100 MG tablet Take 100 mg by mouth daily.     pantoprazole  (PROTONIX ) 40 MG tablet Take 40 mg by mouth daily.     No current facility-administered medications for this visit.    REVIEW OF SYSTEMS:   Constitutional: ( - ) fevers, ( - )  chills , ( - ) night sweats Eyes: ( - ) blurriness of vision, ( - ) double vision, ( - ) watery eyes Ears, nose, mouth, throat, and face: ( - ) mucositis, ( - ) sore throat Respiratory: ( - )  cough, ( - ) dyspnea, ( - ) wheezes Cardiovascular: ( - ) palpitation, ( - ) chest discomfort, ( - ) lower extremity swelling Gastrointestinal:  ( - ) nausea, ( - ) heartburn, ( - ) change in bowel habits Skin: ( - ) abnormal skin rashes Lymphatics: ( - ) new lymphadenopathy, ( - ) easy bruising Neurological: ( - ) numbness, ( - ) tingling, ( - ) new weaknesses Behavioral/Psych: ( - ) mood change, ( - ) new changes  All other systems were reviewed with the patient and are negative.  PHYSICAL EXAMINATION: ECOG PERFORMANCE STATUS: 0 - Asymptomatic  Vitals:   08/27/24 1321  BP: 110/70  Pulse: 76  Resp: 15  Temp: 98.2 F (36.8 C)  SpO2: 100%      Filed Weights   08/27/24 1321  Weight: 153 lb 4.8 oz (69.5 kg)       GENERAL: Well-appearing elderly Caucasian female, alert, no distress and comfortable SKIN: skin color, texture, turgor are normal, no rashes or significant lesions EYES: conjunctiva are pink and non-injected, sclera clear LUNGS: clear to auscultation and percussion with normal breathing effort HEART: regular rate & rhythm and no murmurs and no lower extremity edema Musculoskeletal: no cyanosis of digits and no clubbing  PSYCH: alert & oriented x 3, fluent speech NEURO: no focal motor/sensory deficits  LABORATORY DATA:  I have reviewed the data as listed    Latest Ref Rng & Units 08/27/2024   12:58 PM 05/19/2024   10:58 AM 02/19/2024   10:27 AM  CBC  WBC 4.0 - 10.5 K/uL 6.1  5.2  7.1   Hemoglobin 12.0 - 15.0 g/dL  87.6  87.3  87.3   Hematocrit 36.0 - 46.0 % 36.0  37.5  37.9   Platelets 150 - 400 K/uL 288  281  313        Latest Ref Rng & Units 08/27/2024   12:58 PM 05/19/2024   10:58 AM 02/19/2024   10:27 AM  CMP  Glucose 70 - 99 mg/dL 90  884  895   BUN 8 - 23 mg/dL 19  19  21    Creatinine 0.44 - 1.00 mg/dL 8.87  8.77  8.88   Sodium 135 - 145 mmol/L 142  142  141   Potassium 3.5 - 5.1 mmol/L 4.3  3.8  4.4   Chloride 98 - 111 mmol/L 109  108  108   CO2 22 - 32 mmol/L 27  28  29    Calcium  8.9 - 10.3 mg/dL 9.9  9.8  89.5   Total Protein 6.5 - 8.1 g/dL 6.7  6.6  7.2   Total Bilirubin 0.0 - 1.2 mg/dL 0.5  0.6  0.4   Alkaline Phos 38 - 126 U/L 66  65  68   AST 15 - 41 U/L 19  20  17    ALT 0 - 44 U/L 22  20  14      RADIOGRAPHIC STUDIES: No results found.  ASSESSMENT & PLAN [name] 78 y.o. female with medical history significant for essential thrombocytosis who presents for a follow up visit.  # Essential Thrombocytosis, JAK2 Positive  -- Continue hydroxyurea  500 mg p.o. daily as well as aspirin  81 mg p.o. daily -- Labs today show white blood cell count 6.1, Hgb 12.3, MCV 106.2, Plt 288.  Currently blood counts are on target. -- Plan to maintain platelets between 150 and 400 -- Close monitoring for progression to myelofibrosis or acute leukemia. -- Return to  clinic in 6 months time with interval 69-month labs.  # Heparin  Induced Thrombocytopenia --avoid use of heparin  and heparin  products --recommend call to oncology if alterative anticoagulation  No orders of the defined types were placed in this encounter.   All questions were answered. The patient knows to call the clinic with any problems, questions or concerns.  A total of more than 25 minutes were spent on this encounter with face-to-face time and non-face-to-face time, including preparing to see the patient, ordering tests and/or medications, counseling the patient and coordination of care as outlined above.   Norleen IVAR Kidney, MD Department of Hematology/Oncology [facility] Phone: [phone] Pager: [phone] Email: [email or web address]  09/05/2024 5:08 PM

## 2024-08-31 DIAGNOSIS — H25012 Cortical age-related cataract, left eye: Secondary | ICD-10-CM | POA: Diagnosis not present

## 2024-08-31 DIAGNOSIS — H2512 Age-related nuclear cataract, left eye: Secondary | ICD-10-CM | POA: Diagnosis not present

## 2024-08-31 DIAGNOSIS — Z961 Presence of intraocular lens: Secondary | ICD-10-CM | POA: Diagnosis not present

## 2024-08-31 DIAGNOSIS — H25812 Combined forms of age-related cataract, left eye: Secondary | ICD-10-CM | POA: Diagnosis not present

## 2024-09-13 ENCOUNTER — Other Ambulatory Visit: Payer: Self-pay | Admitting: Hematology and Oncology

## 2024-09-14 DIAGNOSIS — Z961 Presence of intraocular lens: Secondary | ICD-10-CM | POA: Diagnosis not present

## 2024-09-14 DIAGNOSIS — H25811 Combined forms of age-related cataract, right eye: Secondary | ICD-10-CM | POA: Diagnosis not present

## 2024-09-14 DIAGNOSIS — H25011 Cortical age-related cataract, right eye: Secondary | ICD-10-CM | POA: Diagnosis not present

## 2024-09-14 DIAGNOSIS — H2511 Age-related nuclear cataract, right eye: Secondary | ICD-10-CM | POA: Diagnosis not present

## 2024-09-30 DIAGNOSIS — R059 Cough, unspecified: Secondary | ICD-10-CM | POA: Diagnosis not present

## 2024-09-30 DIAGNOSIS — J449 Chronic obstructive pulmonary disease, unspecified: Secondary | ICD-10-CM | POA: Diagnosis not present

## 2024-09-30 DIAGNOSIS — I10 Essential (primary) hypertension: Secondary | ICD-10-CM | POA: Diagnosis not present

## 2024-09-30 DIAGNOSIS — Z01419 Encounter for gynecological examination (general) (routine) without abnormal findings: Secondary | ICD-10-CM | POA: Diagnosis not present

## 2024-09-30 DIAGNOSIS — Z0000 Encounter for general adult medical examination without abnormal findings: Secondary | ICD-10-CM | POA: Diagnosis not present

## 2024-09-30 DIAGNOSIS — E039 Hypothyroidism, unspecified: Secondary | ICD-10-CM | POA: Diagnosis not present

## 2024-09-30 DIAGNOSIS — M810 Age-related osteoporosis without current pathological fracture: Secondary | ICD-10-CM | POA: Diagnosis not present

## 2024-09-30 DIAGNOSIS — E782 Mixed hyperlipidemia: Secondary | ICD-10-CM | POA: Diagnosis not present

## 2024-09-30 DIAGNOSIS — N1831 Chronic kidney disease, stage 3a: Secondary | ICD-10-CM | POA: Diagnosis not present

## 2024-09-30 DIAGNOSIS — R252 Cramp and spasm: Secondary | ICD-10-CM | POA: Diagnosis not present

## 2024-09-30 DIAGNOSIS — Z1331 Encounter for screening for depression: Secondary | ICD-10-CM | POA: Diagnosis not present

## 2024-09-30 DIAGNOSIS — L900 Lichen sclerosus et atrophicus: Secondary | ICD-10-CM | POA: Diagnosis not present

## 2024-11-25 ENCOUNTER — Inpatient Hospital Stay: Attending: Hematology and Oncology

## 2024-11-25 DIAGNOSIS — D473 Essential (hemorrhagic) thrombocythemia: Secondary | ICD-10-CM

## 2024-11-25 LAB — CMP (CANCER CENTER ONLY)
ALT: 21 U/L (ref 0–44)
AST: 24 U/L (ref 15–41)
Albumin: 4.6 g/dL (ref 3.5–5.0)
Alkaline Phosphatase: 73 U/L (ref 38–126)
Anion gap: 13 (ref 5–15)
BUN: 18 mg/dL (ref 8–23)
CO2: 25 mmol/L (ref 22–32)
Calcium: 10 mg/dL (ref 8.9–10.3)
Chloride: 104 mmol/L (ref 98–111)
Creatinine: 1.05 mg/dL — ABNORMAL HIGH (ref 0.44–1.00)
GFR, Estimated: 54 mL/min — ABNORMAL LOW
Glucose, Bld: 113 mg/dL — ABNORMAL HIGH (ref 70–99)
Potassium: 3.8 mmol/L (ref 3.5–5.1)
Sodium: 142 mmol/L (ref 135–145)
Total Bilirubin: 0.5 mg/dL (ref 0.0–1.2)
Total Protein: 7.3 g/dL (ref 6.5–8.1)

## 2024-11-25 LAB — CBC WITH DIFFERENTIAL (CANCER CENTER ONLY)
Abs Immature Granulocytes: 0.02 K/uL (ref 0.00–0.07)
Basophils Absolute: 0.1 K/uL (ref 0.0–0.1)
Basophils Relative: 1 %
Eosinophils Absolute: 0.1 K/uL (ref 0.0–0.5)
Eosinophils Relative: 2 %
HCT: 39.3 % (ref 36.0–46.0)
Hemoglobin: 13.3 g/dL (ref 12.0–15.0)
Immature Granulocytes: 0 %
Lymphocytes Relative: 17 %
Lymphs Abs: 1 K/uL (ref 0.7–4.0)
MCH: 35.8 pg — ABNORMAL HIGH (ref 26.0–34.0)
MCHC: 33.8 g/dL (ref 30.0–36.0)
MCV: 105.9 fL — ABNORMAL HIGH (ref 80.0–100.0)
Monocytes Absolute: 0.5 K/uL (ref 0.1–1.0)
Monocytes Relative: 9 %
Neutro Abs: 4.5 K/uL (ref 1.7–7.7)
Neutrophils Relative %: 71 %
Platelet Count: 369 K/uL (ref 150–400)
RBC: 3.71 MIL/uL — ABNORMAL LOW (ref 3.87–5.11)
RDW: 13.2 % (ref 11.5–15.5)
WBC Count: 6.2 K/uL (ref 4.0–10.5)
nRBC: 0 % (ref 0.0–0.2)

## 2024-12-03 ENCOUNTER — Other Ambulatory Visit: Payer: Self-pay | Admitting: Surgery

## 2024-12-03 ENCOUNTER — Encounter: Payer: Self-pay | Admitting: Surgery

## 2024-12-03 DIAGNOSIS — S32020A Wedge compression fracture of second lumbar vertebra, initial encounter for closed fracture: Secondary | ICD-10-CM

## 2024-12-28 ENCOUNTER — Other Ambulatory Visit

## 2025-02-24 ENCOUNTER — Inpatient Hospital Stay

## 2025-02-24 ENCOUNTER — Inpatient Hospital Stay: Attending: Hematology and Oncology | Admitting: Hematology and Oncology

## 2025-04-18 ENCOUNTER — Ambulatory Visit: Admitting: Neurology
